# Patient Record
Sex: Male | Born: 1950
Health system: Southern US, Community
[De-identification: ages and names within clinical notes are randomized; demographics above are authoritative.]

## PROBLEM LIST (undated history)

## (undated) DIAGNOSIS — F419 Anxiety disorder, unspecified: Secondary | ICD-10-CM

## (undated) DIAGNOSIS — R011 Cardiac murmur, unspecified: Secondary | ICD-10-CM

## (undated) DIAGNOSIS — I499 Cardiac arrhythmia, unspecified: Secondary | ICD-10-CM

## (undated) DIAGNOSIS — M349 Systemic sclerosis, unspecified: Secondary | ICD-10-CM

## (undated) DIAGNOSIS — F329 Major depressive disorder, single episode, unspecified: Secondary | ICD-10-CM

## (undated) DIAGNOSIS — R0600 Dyspnea, unspecified: Secondary | ICD-10-CM

## (undated) DIAGNOSIS — J439 Emphysema, unspecified: Secondary | ICD-10-CM

## (undated) DIAGNOSIS — H269 Unspecified cataract: Secondary | ICD-10-CM

## (undated) DIAGNOSIS — I7300 Raynaud's syndrome without gangrene: Secondary | ICD-10-CM

## (undated) DIAGNOSIS — J849 Interstitial pulmonary disease, unspecified: Secondary | ICD-10-CM

## (undated) DIAGNOSIS — E785 Hyperlipidemia, unspecified: Secondary | ICD-10-CM

## (undated) DIAGNOSIS — F32A Depression, unspecified: Secondary | ICD-10-CM

## (undated) DIAGNOSIS — I739 Peripheral vascular disease, unspecified: Secondary | ICD-10-CM

## (undated) DIAGNOSIS — I4891 Unspecified atrial fibrillation: Secondary | ICD-10-CM

## (undated) DIAGNOSIS — I10 Essential (primary) hypertension: Secondary | ICD-10-CM

## (undated) DIAGNOSIS — F1910 Other psychoactive substance abuse, uncomplicated: Secondary | ICD-10-CM

## (undated) DIAGNOSIS — Z5189 Encounter for other specified aftercare: Secondary | ICD-10-CM

## (undated) HISTORY — DX: Depression, unspecified: F32.A

## (undated) HISTORY — PX: EYE SURGERY: SHX253

## (undated) HISTORY — DX: Major depressive disorder, single episode, unspecified: F32.9

## (undated) HISTORY — DX: Encounter for other specified aftercare: Z51.89

## (undated) HISTORY — DX: Unspecified cataract: H26.9

## (undated) HISTORY — DX: Anxiety disorder, unspecified: F41.9

## (undated) HISTORY — DX: Emphysema, unspecified: J43.9

## (undated) HISTORY — PX: CATARACT EXTRACTION: SUR2

## (undated) HISTORY — PX: COLONOSCOPY: SHX174

## (undated) HISTORY — DX: Hyperlipidemia, unspecified: E78.5

## (undated) HISTORY — PX: HERNIA REPAIR: SHX51

## (undated) HISTORY — DX: Cardiac murmur, unspecified: R01.1

## (undated) HISTORY — DX: Other psychoactive substance abuse, uncomplicated: F19.10

## (undated) HISTORY — PX: ATRIAL FIBRILLATION ABLATION: SHX5732

---

## 1974-06-30 DIAGNOSIS — Z5189 Encounter for other specified aftercare: Secondary | ICD-10-CM

## 1974-06-30 HISTORY — DX: Encounter for other specified aftercare: Z51.89

## 1974-06-30 HISTORY — PX: FRACTURE SURGERY: SHX138

## 1999-02-07 ENCOUNTER — Ambulatory Visit (HOSPITAL_COMMUNITY): Admission: RE | Admit: 1999-02-07 | Discharge: 1999-02-07 | Payer: Self-pay | Admitting: Internal Medicine

## 1999-02-07 ENCOUNTER — Encounter: Payer: Self-pay | Admitting: Internal Medicine

## 1999-03-07 ENCOUNTER — Ambulatory Visit (HOSPITAL_COMMUNITY): Admission: RE | Admit: 1999-03-07 | Discharge: 1999-03-07 | Payer: Self-pay | Admitting: Cardiology

## 2000-10-09 ENCOUNTER — Encounter: Payer: Self-pay | Admitting: Internal Medicine

## 2000-10-09 ENCOUNTER — Ambulatory Visit (HOSPITAL_COMMUNITY): Admission: RE | Admit: 2000-10-09 | Discharge: 2000-10-09 | Payer: Self-pay | Admitting: Internal Medicine

## 2001-12-02 ENCOUNTER — Encounter: Payer: Self-pay | Admitting: Internal Medicine

## 2001-12-02 ENCOUNTER — Ambulatory Visit (HOSPITAL_COMMUNITY): Admission: RE | Admit: 2001-12-02 | Discharge: 2001-12-02 | Payer: Self-pay | Admitting: Internal Medicine

## 2005-01-17 ENCOUNTER — Ambulatory Visit (HOSPITAL_COMMUNITY): Admission: RE | Admit: 2005-01-17 | Discharge: 2005-01-17 | Payer: Self-pay | Admitting: Internal Medicine

## 2007-04-22 ENCOUNTER — Ambulatory Visit (HOSPITAL_COMMUNITY): Admission: RE | Admit: 2007-04-22 | Discharge: 2007-04-22 | Payer: Self-pay | Admitting: Internal Medicine

## 2009-10-06 ENCOUNTER — Inpatient Hospital Stay (HOSPITAL_COMMUNITY): Admission: EM | Admit: 2009-10-06 | Discharge: 2009-10-10 | Payer: Self-pay | Admitting: Emergency Medicine

## 2009-10-08 ENCOUNTER — Encounter (INDEPENDENT_AMBULATORY_CARE_PROVIDER_SITE_OTHER): Payer: Self-pay | Admitting: Cardiology

## 2009-10-10 ENCOUNTER — Encounter: Payer: Self-pay | Admitting: Cardiology

## 2010-04-28 ENCOUNTER — Ambulatory Visit: Payer: Self-pay | Admitting: Cardiology

## 2010-04-28 ENCOUNTER — Ambulatory Visit: Payer: Self-pay | Admitting: Internal Medicine

## 2010-04-28 ENCOUNTER — Inpatient Hospital Stay (HOSPITAL_COMMUNITY): Admission: EM | Admit: 2010-04-28 | Discharge: 2010-05-06 | Payer: Self-pay | Admitting: Cardiology

## 2010-04-28 ENCOUNTER — Encounter: Payer: Self-pay | Admitting: Emergency Medicine

## 2010-04-28 LAB — CONVERTED CEMR LAB: LDL Cholesterol: 113 mg/dL

## 2010-04-30 ENCOUNTER — Encounter: Payer: Self-pay | Admitting: Cardiology

## 2010-05-01 ENCOUNTER — Ambulatory Visit: Payer: Self-pay | Admitting: Dentistry

## 2010-05-01 LAB — CONVERTED CEMR LAB: Creatinine, Ser: 0.82 mg/dL

## 2010-05-06 LAB — CONVERTED CEMR LAB
HDL: 38 mg/dL
Hemoglobin: 14.1 g/dL
MCHC: 33.6 g/dL
MCV: 92.1 fL
RBC: 4.56 M/uL
Total CHOL/HDL Ratio: 4.4
VLDL: 17 mg/dL
WBC: 9.7 10*3/uL

## 2010-05-09 ENCOUNTER — Ambulatory Visit: Payer: Self-pay | Admitting: Cardiology

## 2010-05-13 ENCOUNTER — Encounter: Admission: RE | Admit: 2010-05-13 | Discharge: 2010-05-13 | Payer: Self-pay | Admitting: Dentistry

## 2010-05-16 ENCOUNTER — Ambulatory Visit: Payer: Self-pay | Admitting: Cardiovascular Disease

## 2010-05-16 LAB — CONVERTED CEMR LAB: POC INR: 3.1

## 2010-05-27 ENCOUNTER — Ambulatory Visit: Payer: Self-pay | Admitting: Cardiology

## 2010-06-06 ENCOUNTER — Encounter: Payer: Self-pay | Admitting: Internal Medicine

## 2010-06-06 ENCOUNTER — Ambulatory Visit: Payer: Self-pay | Admitting: Internal Medicine

## 2010-06-06 DIAGNOSIS — F17200 Nicotine dependence, unspecified, uncomplicated: Secondary | ICD-10-CM

## 2010-06-06 DIAGNOSIS — I83009 Varicose veins of unspecified lower extremity with ulcer of unspecified site: Secondary | ICD-10-CM

## 2010-06-06 DIAGNOSIS — I7300 Raynaud's syndrome without gangrene: Secondary | ICD-10-CM

## 2010-06-06 DIAGNOSIS — F341 Dysthymic disorder: Secondary | ICD-10-CM | POA: Insufficient documentation

## 2010-06-06 DIAGNOSIS — L97909 Non-pressure chronic ulcer of unspecified part of unspecified lower leg with unspecified severity: Secondary | ICD-10-CM

## 2010-06-06 LAB — CONVERTED CEMR LAB: POC INR: 3.2

## 2010-07-30 NOTE — Medication Information (Signed)
Summary: rov/eac  Anticoagulant Therapy  Managed by: Weston Brass, PharmD Referring MD: Ladona Ridgel MD, Sharlot Gowda PCP: Dala Dock Supervising MD: Riley Kill MD, Maisie Fus Indication 1: Atrial Flutter Lab Used: [facility] [place] Site: [address] INR POC 2.8 INR RANGE 2.0-3.0  Dietary changes: yes       Details: Had some greens, cranberries, wine for Thanksgiving  Health status changes: no    Bleeding/hemorrhagic complications: no    Recent/future hospitalizations: no    Any changes in medication regimen? no    Recent/future dental: no  Any missed doses?: no       Is patient compliant with meds? yes       Anticoagulation Management History:      The patient is taking warfarin and comes in today for a routine follow up visit.  Negative risk factors for bleeding include an age less than 64 years old.  The bleeding index is 'low risk'.  Negative CHADS2 values include Age > 25 years old.  Anticoagulation responsible provider: Riley Kill MD, Maisie Fus.  INR POC: 2.8.  Cuvette Lot#: [id number].  Exp: 05/2011.    Anticoagulation Management Assessment/Plan:      The patient's current anticoagulation dose is Coumadin 5 mg tabs: Use as directed by anticoagulation clinic..  The next INR is due 06/06/2010.  Results were reviewed/authorized by Weston Brass, PharmD.  He was notified by Hoy Register, PharmD Candidate.         Prior Anticoagulation Instructions: INR 3.1  Start NEW dosing schedule of 2.5 mg (1/2  tablet) on Tuesday, Thursday, and Saturday, and 5 mg (1 tablet) all other days.  Return to clinic in 1 week.   Current Anticoagulation Instructions: INR 2.8 Continue previous dose of 1 tablet everyday except 0.5 tablet on Tuesday, Thursday, and Saturday Recheck INR in 2 weeks

## 2010-07-30 NOTE — Medication Information (Signed)
Summary: new to coumadin  Anticoagulant Therapy  Managed by: Reina Fuse, PharmD Referring MD: Ladona Ridgel MD, Sharlot Gowda PCP: Adonis Housekeeper MD: Antoine Poche MD, Fayrene Fearing Indication 1: Atrial Flutter Lab Used: [facility] [place] Site: [address] INR POC 3.0 INR RANGE 2.0-3.0  Dietary changes: no    Health status changes: no    Bleeding/hemorrhagic complications: no    Recent/future hospitalizations: no    Any changes in medication regimen? yes       Details: Has been on amoxcillin. Has about 5 days left.   Recent/future dental: no  Any missed doses?: no       Is patient compliant with meds? yes      Comments: Dr. Vaughan Basta previous PCP. Pt scheduled for appt at [facility] and with Dr. Ladona Ridgel on 12/8. Previously on Coumadin x 4 wks in January followed at [place].  New pt education completed. Pt expressed understanding about risks and s/sx of bleeding, consistency in dietary vitamin K intake, and calling the clinic with any new medications.   Anticoagulation Management History:      The patient comes in today for his initial visit for anticoagulation therapy.  Negative risk factors for bleeding include an age less than 90 years old.  The bleeding index is 'low risk'.  Negative CHADS2 values include Age > 16 years old.  Anticoagulation responsible Petina [place]: Antoine Poche MD, Fayrene Fearing.  INR POC: 3.0.  Cuvette Lot#: [id number].    Anticoagulation Management Assessment/Plan:      The patient's current anticoagulation dose is Coumadin 5 mg tabs: Use as directed by anticoagulation clinic..  The next INR is due 05/16/2010.  Results were reviewed/authorized by Reina Fuse, PharmD.  He was notified by Reina Fuse PharmD.         Current Anticoagulation Instructions: INR 3.0  Stop taking Lovenox injections. Use Coumadin 2.5 mg tablets. Take Coumadin 2 tabs (5 mg) on Sun, Tues, Wed, Fri, Sat and Coumadin 1 tab (2.5 mg) on Mon, Thur. Return to clinic in 1 week.

## 2010-07-30 NOTE — Medication Information (Signed)
Summary: rov/nb  Anticoagulant Therapy  Managed by: Bethena Midget, RN, BSN Referring MD: Ladona Ridgel MD, Sharlot Gowda PCP: Adonis Housekeeper MD: Graciela Husbands MD, Viviann Spare Indication 1: Atrial Flutter Lab Used: [facility] [place] Site: [address] INR POC 3.2 INR RANGE 2.0-3.0  Dietary changes: no    Health status changes: no    Bleeding/hemorrhagic complications: no    Recent/future hospitalizations: no    Any changes in medication regimen? no    Recent/future dental: no  Any missed doses?: no       Is patient compliant with meds? yes      Comments: Seeing Dr Ladona Ridgel today   Anticoagulation Management History:      The patient is taking warfarin and comes in today for a routine follow up visit.  Negative risk factors for bleeding include an age less than 81 years old.  The bleeding index is 'low risk'.  Negative CHADS2 values include Age > 78 years old.  Anticoagulation responsible provider: Graciela Husbands MD, Viviann Spare.  INR POC: 3.2.  Cuvette Lot#: [id number].  Exp: 08/2011.    Anticoagulation Management Assessment/Plan:      The patient's current anticoagulation dose is Coumadin 5 mg tabs: Use as directed by anticoagulation clinic..  The next INR is due 06/20/2010.  Anticoagulation instructions were given to patient.  Results were reviewed/authorized by Bethena Midget, RN, BSN.  He was notified by Bethena Midget, RN, BSN.         Prior Anticoagulation Instructions: INR 2.8 Continue previous dose of 1 tablet everyday except 0.5 tablet on Tuesday, Thursday, and Saturday Recheck INR in 2 weeks  Current Anticoagulation Instructions: INR 3.2 Skip today's dose then change to 1/2 pill everyday except 1 pill on Mondays, Wednesdays and Fridays. Recheck in 2 weeks.

## 2010-07-30 NOTE — Medication Information (Signed)
Summary: Coumadin Clinic  Anticoagulant Therapy  Managed by: Inactive Referring MD: Ladona Ridgel MD, Sharlot Gowda PCP: Adonis Housekeeper MD: Ladona Ridgel MD, Sharlot Gowda Indication 1: Atrial Flutter Lab Used: [facility] [place] Site: [address] INR RANGE 2.0-3.0          Comments: Pt saw Dr Ladona Ridgel today, off coumadin now on ASA  Anticoagulation Management History:      Negative risk factors for bleeding include an age less than 1 years old.  The bleeding index is 'low risk'.  Negative CHADS2 values include Age > 67 years old.  Anticoagulation responsible provider: Ladona Ridgel MD, Sharlot Gowda.  Exp: 08/2011.    Anticoagulation Management Assessment/Plan:      The patient's current anticoagulation dose is Coumadin 5 mg tabs: Use as directed by anticoagulation clinic..  The next INR is due 06/20/2010.  Anticoagulation instructions were given to patient.  Results were reviewed/authorized by Inactive.         Prior Anticoagulation Instructions: INR 3.2 Skip today's dose then change to 1/2 pill everyday except 1 pill on Mondays, Wednesdays and Fridays. Recheck in 2 weeks.

## 2010-07-30 NOTE — Medication Information (Signed)
Summary: rov/sl  Anticoagulant Therapy  Managed by: Eda Keys, PharmD Referring MD: Ladona Ridgel MD, Sharlot Gowda PCP: Adonis Housekeeper MD: Clifton James MD, Cristal Deer Indication 1: Atrial Flutter Lab Used: [facility] [place] Site: [address] INR POC 3.1 INR RANGE 2.0-3.0  Dietary changes: no    Health status changes: no    Bleeding/hemorrhagic complications: no    Recent/future hospitalizations: no    Any changes in medication regimen? no    Recent/future dental: no  Any missed doses?: no       Is patient compliant with meds? yes       Anticoagulation Management History:      The patient is taking warfarin and comes in today for a routine follow up visit.  Negative risk factors for bleeding include an age less than 44 years old.  The bleeding index is 'low risk'.  Negative CHADS2 values include Age > 40 years old.  Anticoagulation responsible provider: Clifton James MD, Cristal Deer.  INR POC: 3.1.  Cuvette Lot#: [id number].  Exp: 05/2011.    Anticoagulation Management Assessment/Plan:      The patient's current anticoagulation dose is Coumadin 5 mg tabs: Use as directed by anticoagulation clinic..  The next INR is due 05/24/2010.  Results were reviewed/authorized by Eda Keys, PharmD.  He was notified by Eda Keys.         Prior Anticoagulation Instructions: INR 3.0  Stop taking Lovenox injections. Use Coumadin 2.5 mg tablets. Take Coumadin 2 tabs (5 mg) on Sun, Tues, Wed, Fri, Sat and Coumadin 1 tab (2.5 mg) on Mon, Thur. Return to clinic in 1 week.  Current Anticoagulation Instructions: INR 3.1  Start NEW dosing schedule of 2.5 mg (1/2  tablet) on Tuesday, Thursday, and Saturday, and 5 mg (1 tablet) all other days.  Return to clinic in 1 week.
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
## 2010-07-30 NOTE — Assessment & Plan Note (Signed)
Summary: eph/jml   Visit Type:  Follow-up Primary Provider:  [facility]   History of Present Illness: Bradley Ferguson returns today for followup.  He is a pleasant 60 yo man with atrial fluttter who underwent EPS/RFA several weeks ago.  He had done well from an arrhythmia perspective.  He denies c/p, sob, or peripheral edema.  No palpitations.  Current Medications (verified): 1)  Coumadin 5 Mg Tabs (Warfarin Sodium) .... Use As Directed By Anticoagulation Clinic. 2)  Alprazolam 0.5 Mg Tabs (Alprazolam) .... Take One Tablet At Bedtime As Needed.  Allergies (verified): No Known Drug Allergies  Past History:  Past Medical History: Last updated: 06/06/2010 Current Problems:  ATRIAL FLUTTER (ICD-427.32)    Review of Systems  The patient denies chest pain, syncope, dyspnea on exertion, and peripheral edema.    Vital Signs:  Patient profile:   60 year old male Height:      73 inches Weight:      172 pounds BMI:     22.77 Pulse rate:   80 / minute BP sitting:   120 / 78  (left arm)  Vitals Entered By: Laurance Flatten CMA (June 06, 2010 10:52 AM)  Physical Exam  General:  Well developed, well nourished, in no acute distress.  HEENT: normal Neck: supple. No JVD. Carotids 2+ bilaterally no bruits Cor: RRR no rubs, gallops or murmur Lungs: CTA Ab: soft, nontender. nondistended. No HSM. Good bowel sounds Ext: warm. no cyanosis, clubbing or edema Neuro: alert and oriented. Grossly nonfocal. affect pleasant    EKG  Procedure date:  06/06/2010  Findings:      Normal sinus rhythm with rate of:  80.  Impression & Recommendations:  Problem # 1:  ATRIAL FLUTTER (ICD-427.32) He has done well since his ablation.  I have recommended he stop coumadin and start ASA. His updated medication list for this problem includes:    Aspirin 325 Mg Tabs (Aspirin) ..... One by mouth daily  Patient Instructions: 1)  Your physician recommends that you schedule a follow-up appointment as  needed 2)  Your physician has recommended you make the following change in your medication: stop Coumadin start Aspirin 325mg 

## 2010-08-08 ENCOUNTER — Encounter: Payer: Self-pay | Admitting: Nurse Practitioner

## 2010-08-08 ENCOUNTER — Encounter (INDEPENDENT_AMBULATORY_CARE_PROVIDER_SITE_OTHER): Payer: Self-pay | Admitting: Nurse Practitioner

## 2010-08-08 DIAGNOSIS — F988 Other specified behavioral and emotional disorders with onset usually occurring in childhood and adolescence: Secondary | ICD-10-CM | POA: Insufficient documentation

## 2010-08-08 DIAGNOSIS — K029 Dental caries, unspecified: Secondary | ICD-10-CM | POA: Insufficient documentation

## 2010-08-15 NOTE — Assessment & Plan Note (Signed)
Summary: Dental/Optho referral   Vital Signs:  Patient profile:   60 year old male Weight:      173.6 pounds Temp:     97.5 degrees F oral Pulse rate:   72 / minute Pulse rhythm:   regular Resp:     16 per minute BP sitting:   124 / 76  (left arm) Cuff size:   regular  Vitals Entered By: Levon Hedger (August 08, 2010 10:21 AM) CC: dental referral for broken teeth...opthamology referral, Depression Is Patient Diabetic? No Pain Assessment Patient in pain? no       Does patient need assistance? Functional Status Self care Ambulation Normal   Primary Care Provider:  [facility]  CC:  dental referral for broken teeth...opthamology referral and Depression.  History of Present Illness:  Pt into the office for f/u on dental referral. he did have some recent extractions during a hospitalization as he has infection. He does have some other teeth that need removal. He has some other teeth in which the filling as spontaneously fallen out  Right eye - Known history of cataracts in his right eye. Never had official Dx but was seen by a friend as a compassionate visit but not treatment indicated. [facility] and he was also instructed that he needed referral to an opthomologist.  Psych- pt was tried on Ritalin and Adderral due to psych history.  Pt had to stop taking due to cardiac history.  He has taken alprazolam as needed for years at night for rest however he has recently tried to take during the day (small amounts - 1/2 of the recommended dose)  Pt reports that when he is active such as at work and if he takes small amounts of the pill then it actually increases his productivity.  He has also taken multiple depression meds over the years without resolution of the symptoms.  Depression History:      The patient denies a depressed mood most of the day and a diminished interest in his usual daily activities.  Positive alarm features for  depression include fatigue (loss of energy) and impaired concentration (indecisiveness).  However, he denies recurrent thoughts of death or suicide.        Psychosocial stress factors include major life changes.  The patient denies that he feels like life is not worth living, denies that he wishes that he were dead, and denies that he has thought about ending his life.         Habits & Providers  Alcohol-Tobacco-Diet     Alcohol drinks/day: <1     Alcohol Counseling: not indicated; use of alcohol is not excessive or problematic     Tobacco Status: current     Tobacco Counseling: to quit use of tobacco products     Cigarette Packs/Day: 1/2 pack  Exercise-Depression-Behavior     Depression Counseling: not indicated; screening negative for depression     Drug Use: never  Allergies (verified): No Known Drug Allergies  Review of Systems General:  Denies fever. Eyes:  Complains of blurring. ENT:  Complains of ringing in ears. CV:  Denies chest pain or discomfort. Resp:  Denies cough. GI:  Denies abdominal pain, nausea, and vomiting. Psych:  Complains of anxiety and depression.  Physical Exam  General:  alert.   Head:  glasses Mouth:  poor dentition.   Lungs:  normal breath sounds.   Heart:  normal rate and regular rhythm.  Neurologic:  alert & oriented X3.   Psych:  Oriented X3.  talkative   Impression & Recommendations:  Problem # 1:  DENTAL CARIES (ICD-521.00) advised pt that he will be referred to the dental clinic and he will be contacted with the time/date of the appt Orders: Dental Referral (Dentist)  Problem # 2:  VISUAL ACUITY, DECREASED (ICD-369.9) pt has what he thinks to be a known cataract vision problems at this time - right eye needs referral  Orders: Ophthalmology Referral (Ophthalmology)  Problem # 3:  TOBACCO ABUSE (ICD-305.1) ongoing  advise pt to quit   Problem # 4:  ANXIETY DEPRESSION (ICD-300.4) will oblige pt's request to refill alprazolam -  advise pt to take as ordered also spoke with pt that this med may cause sedation - would advise against taking during the day but if he must do so, take cautiiously  Complete Medication List: 1)  Aspirin 325 Mg Tabs (Aspirin) .... One by mouth daily 2)  Alprazolam 0.5 Mg Tabs (Alprazolam) .... One tablet by mouth two times a day as needed for mood/anxiety  Patient Instructions: 1)  Dentist - You will be referred to the dental clinic. You will be notified of the time/date of your appointment 2)  Eye - You will be referrred to the eye doctor.  You will be notified o the time/date of the appointment. 3)  You can these two numbers on your own to see if they can offer you any assistance 4)  [facility] [phone] 5)  or [facility] [phone] 6)  Alprazolam - take as needed. Be advised that this medication may make you sleepy so take with caution during the day 7)  Follow up as needed Prescriptions: ALPRAZOLAM 0.5 MG TABS (ALPRAZOLAM) One tablet by mouth two times a day as needed for mood/anxiety  #60 x 0   Entered and Authorized by:   Lehman Prom FNP   Signed by:   Lehman Prom FNP on 08/08/2010   Method used:   Print then Give to Patient   RxID:   [id number]    Orders Added: 1)  Est. Patient Level III [95284] 2)  Dental Referral [Dentist] 3)  Ophthalmology Referral [Ophthalmology]

## 2010-08-15 NOTE — Letter (Signed)
Summary: DENTAL REFERRAL   DENTAL REFERRAL   Imported By: Arta Bruce 08/08/2010 12:04:07  _____________________________________________________________________  External Attachment:    Type:   Image     Comment:   External Document

## 2010-08-15 NOTE — Letter (Signed)
Summary: AGREEMENT FOR CONTROLLED SUBSTANCE  AGREEMENT FOR CONTROLLED SUBSTANCE   Imported By: Arta Bruce 08/08/2010 11:44:46  _____________________________________________________________________  External Attachment:    Type:   Image     Comment:   External Document

## 2010-08-15 NOTE — Letter (Signed)
Summary: Handout Printed  Printed Handout:  - Cataract

## 2010-09-10 LAB — CBC
HCT: 37.6 % — ABNORMAL LOW (ref 39.0–52.0)
HCT: 38.7 % — ABNORMAL LOW (ref 39.0–52.0)
HCT: 40.9 % (ref 39.0–52.0)
Hemoglobin: 12.2 g/dL — ABNORMAL LOW (ref 13.0–17.0)
Hemoglobin: 12.4 g/dL — ABNORMAL LOW (ref 13.0–17.0)
Hemoglobin: 12.8 g/dL — ABNORMAL LOW (ref 13.0–17.0)
Hemoglobin: 13 g/dL (ref 13.0–17.0)
Hemoglobin: 13.6 g/dL (ref 13.0–17.0)
MCH: 29.9 pg (ref 26.0–34.0)
MCH: 30 pg (ref 26.0–34.0)
MCH: 30.6 pg (ref 26.0–34.0)
MCH: 31.2 pg (ref 26.0–34.0)
MCHC: 33.1 g/dL (ref 30.0–36.0)
MCHC: 34 g/dL (ref 30.0–36.0)
MCV: 91.5 fL (ref 78.0–100.0)
MCV: 91.9 fL (ref 78.0–100.0)
MCV: 92.1 fL (ref 78.0–100.0)
MCV: 92.1 fL (ref 78.0–100.0)
Platelets: 138 10*3/uL — ABNORMAL LOW (ref 150–400)
Platelets: 144 10*3/uL — ABNORMAL LOW (ref 150–400)
Platelets: 145 10*3/uL — ABNORMAL LOW (ref 150–400)
RBC: 4.05 MIL/uL — ABNORMAL LOW (ref 4.22–5.81)
RBC: 4.08 MIL/uL — ABNORMAL LOW (ref 4.22–5.81)
RBC: 4.1 MIL/uL — ABNORMAL LOW (ref 4.22–5.81)
RBC: 4.23 MIL/uL (ref 4.22–5.81)
RBC: 4.33 MIL/uL (ref 4.22–5.81)
RBC: 4.44 MIL/uL (ref 4.22–5.81)
RBC: 4.56 MIL/uL (ref 4.22–5.81)
RDW: 14.1 % (ref 11.5–15.5)
RDW: 14.2 % (ref 11.5–15.5)
WBC: 6.7 10*3/uL (ref 4.0–10.5)
WBC: 9.1 10*3/uL (ref 4.0–10.5)
WBC: 9.2 10*3/uL (ref 4.0–10.5)
WBC: 9.7 10*3/uL (ref 4.0–10.5)

## 2010-09-10 LAB — HEPARIN LEVEL (UNFRACTIONATED)
Heparin Unfractionated: 0.14 IU/mL — ABNORMAL LOW (ref 0.30–0.70)
Heparin Unfractionated: 0.14 IU/mL — ABNORMAL LOW (ref 0.30–0.70)
Heparin Unfractionated: 0.18 IU/mL — ABNORMAL LOW (ref 0.30–0.70)
Heparin Unfractionated: 0.22 IU/mL — ABNORMAL LOW (ref 0.30–0.70)
Heparin Unfractionated: 0.32 IU/mL (ref 0.30–0.70)
Heparin Unfractionated: 0.37 IU/mL (ref 0.30–0.70)
Heparin Unfractionated: <0.1 IU/mL — ABNORMAL LOW (ref 0.30–0.70)

## 2010-09-10 LAB — PROTIME-INR
INR: 1.01 (ref 0.00–1.49)
INR: 1.53 — ABNORMAL HIGH (ref 0.00–1.49)
INR: 1.66 — ABNORMAL HIGH (ref 0.00–1.49)
Prothrombin Time: 19.8 seconds — ABNORMAL HIGH (ref 11.6–15.2)

## 2010-09-10 LAB — BASIC METABOLIC PANEL
BUN: 15 mg/dL (ref 6–23)
CO2: 27 mEq/L (ref 19–32)
Chloride: 105 mEq/L (ref 96–112)
Chloride: 108 mEq/L (ref 96–112)
Creatinine, Ser: 0.91 mg/dL (ref 0.4–1.5)
GFR calc Af Amer: >60 mL/min (ref >60)
GFR calc non Af Amer: >60 mL/min (ref >60)

## 2010-09-10 LAB — HEPARIN INDUCED THROMBOCYTOPENIA PNL
UFH High Dose UFH H: 0 % Release
UFH SRA Result: NEGATIVE

## 2010-09-11 LAB — URINALYSIS, ROUTINE W REFLEX MICROSCOPIC
Glucose, UA: NEGATIVE mg/dL
Ketones, ur: NEGATIVE mg/dL
Nitrite: NEGATIVE
Protein, ur: NEGATIVE mg/dL
Urobilinogen, UA: 0.2 mg/dL (ref 0.0–1.0)

## 2010-09-11 LAB — CBC
HCT: 39.8 % (ref 39.0–52.0)
HCT: 43.5 % (ref 39.0–52.0)
HCT: 44.2 % (ref 39.0–52.0)
Hemoglobin: 13.4 g/dL (ref 13.0–17.0)
Hemoglobin: 14.9 g/dL (ref 13.0–17.0)
Hemoglobin: 15.2 g/dL (ref 13.0–17.0)
MCHC: 34.3 g/dL (ref 30.0–36.0)
MCHC: 34.3 g/dL (ref 30.0–36.0)
MCV: 92.2 fL (ref 78.0–100.0)
MCV: 92.3 fL (ref 78.0–100.0)
Platelets: 147 10*3/uL — ABNORMAL LOW (ref 150–400)
RBC: 4.31 MIL/uL (ref 4.22–5.81)
RBC: 4.75 MIL/uL (ref 4.22–5.81)
WBC: 10 10*3/uL (ref 4.0–10.5)
WBC: 10 10*3/uL (ref 4.0–10.5)
WBC: 9.2 10*3/uL (ref 4.0–10.5)

## 2010-09-11 LAB — POCT I-STAT, CHEM 8
BUN: 16 mg/dL (ref 6–23)
Chloride: 105 mEq/L (ref 96–112)
Glucose, Bld: 96 mg/dL (ref 70–99)
HCT: 47 % (ref 39.0–52.0)
Potassium: 4.4 mEq/L (ref 3.5–5.1)

## 2010-09-11 LAB — POCT CARDIAC MARKERS
CKMB, poc: <1 ng/mL — ABNORMAL LOW (ref 1.0–8.0)
Myoglobin, poc: 54.1 ng/mL (ref 12–200)

## 2010-09-11 LAB — DIFFERENTIAL
Basophils Relative: 1 % (ref 0–1)
Eosinophils Relative: 5 % (ref 0–5)
Lymphocytes Relative: 29 % (ref 12–46)
Lymphocytes Relative: 30 % (ref 12–46)
Monocytes Absolute: 0.7 10*3/uL (ref 0.1–1.0)
Monocytes Absolute: 0.7 10*3/uL (ref 0.1–1.0)
Monocytes Relative: 7 % (ref 3–12)
Monocytes Relative: 7 % (ref 3–12)
Neutro Abs: 5.8 10*3/uL (ref 1.7–7.7)
Neutro Abs: 5.8 10*3/uL (ref 1.7–7.7)
Neutrophils Relative %: 58 % (ref 43–77)

## 2010-09-11 LAB — MRSA PCR SCREENING: MRSA by PCR: NEGATIVE

## 2010-09-11 LAB — PROTIME-INR
INR: 1.01 (ref 0.00–1.49)
Prothrombin Time: 13.5 seconds (ref 11.6–15.2)

## 2010-09-11 LAB — TSH: TSH: 1.682 u[IU]/mL (ref 0.350–4.500)

## 2010-09-11 LAB — RAPID URINE DRUG SCREEN, HOSP PERFORMED
Barbiturates: NOT DETECTED
Benzodiazepines: POSITIVE — AB

## 2010-09-18 LAB — T4: T4, Total: 9 ug/dL (ref 5.0–12.5)

## 2010-09-18 LAB — COMPREHENSIVE METABOLIC PANEL
ALT: 16 U/L (ref 0–53)
CO2: 29 mEq/L (ref 19–32)
Calcium: 9.1 mg/dL (ref 8.4–10.5)
GFR calc non Af Amer: >60 mL/min (ref >60)
Glucose, Bld: 92 mg/dL (ref 70–99)
Sodium: 138 mEq/L (ref 135–145)

## 2010-09-18 LAB — CBC
HCT: 42.4 % (ref 39.0–52.0)
Hemoglobin: 14.4 g/dL (ref 13.0–17.0)
MCHC: 33.5 g/dL (ref 30.0–36.0)
MCHC: 33.8 g/dL (ref 30.0–36.0)
MCHC: 33.8 g/dL (ref 30.0–36.0)
MCV: 94.1 fL (ref 78.0–100.0)
MCV: 94.2 fL (ref 78.0–100.0)
MCV: 94.3 fL (ref 78.0–100.0)
RBC: 4.07 MIL/uL — ABNORMAL LOW (ref 4.22–5.81)
RBC: 4.27 MIL/uL (ref 4.22–5.81)
RBC: 4.5 MIL/uL (ref 4.22–5.81)
RDW: 13.9 % (ref 11.5–15.5)
RDW: 14.2 % (ref 11.5–15.5)

## 2010-09-18 LAB — HEPARIN LEVEL (UNFRACTIONATED)
Heparin Unfractionated: 0.46 IU/mL (ref 0.30–0.70)
Heparin Unfractionated: 0.47 IU/mL (ref 0.30–0.70)
Heparin Unfractionated: 0.52 IU/mL (ref 0.30–0.70)
Heparin Unfractionated: 0.7 IU/mL (ref 0.30–0.70)

## 2010-09-18 LAB — PROTIME-INR
INR: 2.42 — ABNORMAL HIGH (ref 0.00–1.49)
Prothrombin Time: 13.5 seconds (ref 11.6–15.2)
Prothrombin Time: 14.2 seconds (ref 11.6–15.2)
Prothrombin Time: 26.1 seconds — ABNORMAL HIGH (ref 11.6–15.2)

## 2010-09-18 LAB — TSH: TSH: 0.849 u[IU]/mL (ref 0.350–4.500)

## 2010-09-18 LAB — MRSA PCR SCREENING: MRSA by PCR: NEGATIVE

## 2010-09-18 LAB — DIFFERENTIAL
Basophils Relative: 0 % (ref 0–1)
Eosinophils Absolute: 0.6 10*3/uL (ref 0.0–0.7)
Lymphs Abs: 2.6 10*3/uL (ref 0.7–4.0)
Neutrophils Relative %: 58 % (ref 43–77)

## 2010-09-18 LAB — POCT CARDIAC MARKERS: Myoglobin, poc: 79.2 ng/mL (ref 12–200)

## 2010-09-18 LAB — CARDIAC PANEL(CRET KIN+CKTOT+MB+TROPI)
CK, MB: 1.6 ng/mL (ref 0.3–4.0)
Total CK: 97 U/L (ref 7–232)

## 2011-02-18 ENCOUNTER — Emergency Department (HOSPITAL_COMMUNITY)
Admission: EM | Admit: 2011-02-18 | Discharge: 2011-02-18 | Disposition: A | Discharge status: Home / Self Care | Payer: Self-pay | Attending: Emergency Medicine | Admitting: Emergency Medicine

## 2011-02-18 DIAGNOSIS — Y92009 Unspecified place in unspecified non-institutional (private) residence as the place of occurrence of the external cause: Secondary | ICD-10-CM | POA: Insufficient documentation

## 2011-02-18 DIAGNOSIS — R000 Tachycardia, unspecified: Secondary | ICD-10-CM | POA: Insufficient documentation

## 2011-02-18 DIAGNOSIS — W2209XA Striking against other stationary object, initial encounter: Secondary | ICD-10-CM | POA: Insufficient documentation

## 2011-02-18 DIAGNOSIS — S0100XA Unspecified open wound of scalp, initial encounter: Secondary | ICD-10-CM | POA: Insufficient documentation

## 2011-02-28 ENCOUNTER — Inpatient Hospital Stay (INDEPENDENT_AMBULATORY_CARE_PROVIDER_SITE_OTHER)
Admission: RE | Admit: 2011-02-28 | Discharge: 2011-02-28 | Disposition: A | Discharge status: Home / Self Care | Payer: Self-pay | Source: Ambulatory Visit | Attending: Emergency Medicine | Admitting: Emergency Medicine

## 2011-02-28 DIAGNOSIS — S0180XA Unspecified open wound of other part of head, initial encounter: Secondary | ICD-10-CM

## 2014-12-06 ENCOUNTER — Ambulatory Visit (INDEPENDENT_AMBULATORY_CARE_PROVIDER_SITE_OTHER): Payer: Worker's Compensation | Admitting: Physician Assistant

## 2014-12-06 VITALS — BP 124/64 | HR 74 | Temp 98.6°F | Resp 14 | Ht 73.25 in | Wt 178.4 lb

## 2014-12-06 DIAGNOSIS — W57XXXA Bitten or stung by nonvenomous insect and other nonvenomous arthropods, initial encounter: Secondary | ICD-10-CM | POA: Diagnosis not present

## 2014-12-06 DIAGNOSIS — T148 Other injury of unspecified body region: Secondary | ICD-10-CM

## 2014-12-06 NOTE — Progress Notes (Signed)
[facility] [address], [place] [address] [phone]  Date:  12/06/2014   Name:  Bradley Ferguson   DOB:  10/25/1950   MRN:  [id number]  PCP:  No primary care provider on file.    Chief Complaint: Insect Bite   History of Present Illness:  This is a 64 y.o. male  who is presenting with 2 tick bites that occurred at work. He suspects tick bites occurred 1 week ago. 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 He thinks the ticks were attached for 48 hours. He noticed some erythema and induration around the bites. This is been slowly improving over the past 4 days. He no longer has any induration but continues to have erythema. He denies fever, chills, headache, rash, new arthralgias, N/V.   Review of Systems:  Review of Systems See HPI  Prior to Admission medications   Not on File    No Known Allergies  Past Surgical History  Procedure Laterality Date  . Eye surgery    . Fracture surgery    . Atrial fibrillation ablation      History  Substance Use Topics  . Smoking status: Current Every Day Smoker  . Smokeless tobacco: Not on file  . Alcohol Use: Not on file    Family History  Problem Relation Age of Onset  . Cancer Mother   . Cancer Father     Medication list has been reviewed and updated.  Physical Examination:  Physical Exam  Constitutional: He is oriented to person, place, and time. He appears well-developed and well-nourished. No distress.  HENT:  Head: Normocephalic and atraumatic.  Right Ear: Hearing normal.  Left Ear: Hearing normal.  Nose: Nose normal.  Eyes: Conjunctivae and lids are normal. Right eye exhibits no discharge. Left eye exhibits no discharge. No scleral icterus.  Cardiovascular: Normal rate, regular rhythm, normal heart sounds and normal pulses.   No murmur heard. Pulmonary/Chest: Effort normal and breath sounds normal. No respiratory distress. He has no wheezes. He has no rhonchi. He has no  rales.  Musculoskeletal: Normal range of motion.  Lymphadenopathy:       Head (right side): No submental, no submandibular and no tonsillar adenopathy present.       Head (left side): No submental, no submandibular and no tonsillar adenopathy present.    He has no cervical adenopathy.  Neurological: He is alert and oriented to person, place, and time.  Skin: Skin is warm, dry and intact. Lesion noted. No rash noted.  0.5 cm black scab with surrounding 4 cm erythema on right side of abdomen. No induration or tenderness.  0.5 cm black scab with surrounding 2 cm erythema on right thigh. No induration or tenderness.  Psychiatric: He has a normal mood and affect. His speech is normal and behavior is normal. Thought content normal.   BP 124/64 mmHg  Pulse 74  Temp(Src) 98.6 F (37 C) (Oral)  Resp 14  Ht 6' 1.25" (1.861 m)  Wt 178 lb 6.4 oz (80.922 kg)  BMI 23.37 kg/m2  SpO2 99%  Assessment and Plan:  1. Tick bite Tick bite lesions with local reaction. Lesions do not appear infected. Very unlikely that patient has Lyme disease or [facility] spotted fever but will draw titers for patient reassurance. No treatment in the meantime. - [place] spotted fvr ab, IgM-blood - B. burgdorfi antibodies   Benjaman Pott. Drenda Freeze, MHS [facility]  12/06/2014

## 2014-12-06 NOTE — Patient Instructions (Signed)
I will call you with lab results. In the meantime if you develop fever, chills, headache, new joint pain, nausea, vomiting-return to clinic. No treatment needed at this time.

## 2014-12-07 LAB — ROCKY MTN SPOTTED FVR AB, IGM-BLOOD: ROCKY MTN SPOTTED FEVER, IGM: 0.26 IV

## 2014-12-07 LAB — B. BURGDORFI ANTIBODIES: B BURGDORFERI AB IGG+ IGM: 0.28 {ISR}

## 2015-08-13 ENCOUNTER — Ambulatory Visit: Payer: Self-pay | Admitting: Internal Medicine

## 2015-08-13 ENCOUNTER — Encounter: Payer: Self-pay | Admitting: Internal Medicine

## 2015-08-13 ENCOUNTER — Ambulatory Visit (INDEPENDENT_AMBULATORY_CARE_PROVIDER_SITE_OTHER): Payer: Self-pay | Admitting: Internal Medicine

## 2015-08-13 VITALS — BP 122/80 | HR 76 | Temp 97.0°F | Resp 16 | Ht 74.5 in | Wt 195.4 lb

## 2015-08-13 DIAGNOSIS — F3162 Bipolar disorder, current episode mixed, moderate: Secondary | ICD-10-CM

## 2015-08-13 DIAGNOSIS — B070 Plantar wart: Secondary | ICD-10-CM

## 2015-08-13 DIAGNOSIS — I4891 Unspecified atrial fibrillation: Secondary | ICD-10-CM | POA: Insufficient documentation

## 2015-08-13 DIAGNOSIS — F319 Bipolar disorder, unspecified: Secondary | ICD-10-CM | POA: Insufficient documentation

## 2015-08-13 MED ORDER — CITALOPRAM HYDROBROMIDE 40 MG PO TABS: ORAL_TABLET | ORAL | Status: DC

## 2015-08-13 MED ORDER — ALPRAZOLAM 1 MG PO TABS: ORAL_TABLET | ORAL | Status: DC

## 2015-08-13 MED ORDER — HYDROCODONE-ACETAMINOPHEN 5-325 MG PO TABS: ORAL_TABLET | ORAL | Status: DC

## 2015-08-13 NOTE — Progress Notes (Signed)
  Subjective:    Patient ID: Bradley Ferguson, male    DOB: 05-05-1951, 65 y.o.   MRN: [id number]  HPI  Patient is a very sad chronically ill [name] with multiple Medical co-morbidities  With long hx/o labile HT who has been lost to f/u for 5-6+ years who returns with hx/o pAfib/Flutter undergoing CV in Mar 2011 and then in Nov 2011 had RFA for AFlutter. He has hx/o chronic venous insufficiency and Bipolar - mixed and requests meds for anxiety and depression. His primary c/o is of a painful plantar wart of his R foot.   Currently he's on no meds & denies drug allergies .   Past Medical History  Diagnosis Date  . Anxiety   . Blood transfusion without reported diagnosis   . Cataract   . Depression   . Emphysema of lung ([place])   . Heart murmur    Past Surgical History  Procedure Laterality Date  . Eye surgery    . Fracture surgery    . Atrial fibrillation ablation     Review of Systems as above    Objective:   Physical Exam  BP 122/80 mmHg  Pulse 76  Temp(Src) 97 F (36.1 C)  Resp 16  Ht 6' 2.5" (1.892 m)  Wt 195 lb 6.4 oz (88.633 kg)  BMI 24.76 kg/m2  Patient appears chronically ill - much older than stated age, in no acute distress, with pressured speech, ? Sl flight of ideas and as the epitome of human despair.   Focused exam finds a tender plantar wart t the R plantar 5th MT area . After informed consent and aseptic prep with alcohol and then local anesthesia with Marcaine .05% with Epi,  the area was sharply excised to the deep sub-cut with an # 10 scalpel and then the wound base was deeply hyfrecated. Sterile Abx Ung was applied & patient was instructed in wound care.      Assessment & Plan:   1. Plantar wart, right foot  - HYDROcodone-acetaminophen (NORCO) 5-325 MG tablet; Take 1/2 to 1 tab every 4 hr if needed for pain  Dispense: 30 tablet; Refill: 0  2. Bipolar 1 disorder, mixed, moderate (HCC)  - ALPRAZolam (XANAX) 1 MG tablet; Take 1/2 to 1 tablet 3 x day if need for  anxiety of sleep  Dispense: 90 tablet; Refill: 1 - citalopram (CELEXA) 40 MG tablet; Take 1 tablet daily for Mood & Depression  Dispense: 90 tablet; Refill: 1  - Recc reschedule for f/u for PE & basic labs.

## 2015-11-12 ENCOUNTER — Ambulatory Visit (INDEPENDENT_AMBULATORY_CARE_PROVIDER_SITE_OTHER): Payer: Self-pay | Admitting: Internal Medicine

## 2015-11-12 ENCOUNTER — Encounter: Payer: Self-pay | Admitting: Internal Medicine

## 2015-11-12 VITALS — BP 130/66 | HR 88 | Temp 97.9°F | Resp 16 | Ht 74.5 in | Wt 187.2 lb

## 2015-11-12 DIAGNOSIS — E559 Vitamin D deficiency, unspecified: Secondary | ICD-10-CM | POA: Insufficient documentation

## 2015-11-12 DIAGNOSIS — R030 Elevated blood-pressure reading, without diagnosis of hypertension: Secondary | ICD-10-CM

## 2015-11-12 DIAGNOSIS — Z0001 Encounter for general adult medical examination with abnormal findings: Secondary | ICD-10-CM

## 2015-11-12 DIAGNOSIS — E782 Mixed hyperlipidemia: Secondary | ICD-10-CM | POA: Insufficient documentation

## 2015-11-12 DIAGNOSIS — IMO0001 Reserved for inherently not codable concepts without codable children: Secondary | ICD-10-CM

## 2015-11-12 DIAGNOSIS — R6889 Other general symptoms and signs: Secondary | ICD-10-CM

## 2015-11-12 DIAGNOSIS — E785 Hyperlipidemia, unspecified: Secondary | ICD-10-CM

## 2015-11-12 DIAGNOSIS — I480 Paroxysmal atrial fibrillation: Secondary | ICD-10-CM

## 2015-11-12 LAB — CBC WITH DIFFERENTIAL/PLATELET
BASOS PCT: 1 %
Basophils Absolute: 96 cells/uL (ref 0–200)
EOS PCT: 2 %
Eosinophils Absolute: 192 cells/uL (ref 15–500)
HEMATOCRIT: 42.3 % (ref 38.5–50.0)
Hemoglobin: 14 g/dL (ref 13.2–17.1)
LYMPHS ABS: 2400 {cells}/uL (ref 850–3900)
LYMPHS PCT: 25 %
MCH: 30 pg (ref 27.0–33.0)
MCHC: 33.1 g/dL (ref 32.0–36.0)
MCV: 90.6 fL (ref 80.0–100.0)
MONO ABS: 768 {cells}/uL (ref 200–950)
MPV: 11.2 fL (ref 7.5–12.5)
Monocytes Relative: 8 %
Neutro Abs: 6144 cells/uL (ref 1500–7800)
Neutrophils Relative %: 64 %
Platelets: 170 10*3/uL (ref 140–400)
RBC: 4.67 MIL/uL (ref 4.20–5.80)
RDW: 14.6 % (ref 11.0–15.0)
WBC: 9.6 10*3/uL (ref 3.8–10.8)

## 2015-11-12 LAB — TSH: TSH: 1.57 m[IU]/L (ref 0.40–4.50)

## 2015-11-12 NOTE — Progress Notes (Signed)
Patient ID: Bradley Ferguson, male   DOB: [birth year], 65 y.o.   MRN: [id number]  [facility]                       [name], M.D.        Uvaldo Bristle. Silverio Lay, P.A.-C       Starlyn Skeans, P.A.-C   [facility]                [address] [place], [place] [id number] Telephone [phone] Telefax [phone] _________________________________________________________________________   This very nice 65 y.o. [name] presents for follow up with hx/o labile elevated BP in the past and Hyperlipidemia. Patient if the past has re;lated sx's consistent with Raynaud's syndrome. He also has hx/o remote a crush type injury to the L thigh in 1976. Other problems include hx/o mixed bipolar type depression. Patient had recently been started on Citalopram, but d/c'd for unclear reasons.    Patient is monitored expectantly for treated for HTN & BP has been controlled and today's BP: 130/66 mmHg.  In Mar 2011, patient was found at an ER visit in Afib and was CV and when found in Afib a 2sd time in Nov 2011, he underwent RFA. Patient has had no complaints of any cardiac type chest pain, dyspnea/orthopnea/PND, dizziness, claudication  or dependent edema, but does report infreq  isolated palpitations at rest.   Hyperlipidemia is not controlled with diet & patient declines treatment due to lack of insurance .   Medication Sig  . ALPRAZolam (XANAX) 1 MG tablet Take 1/2 to 1 tablet 3 x day if need for anxiety of sleep  . citalopram (CELEXA) 40 MG tablet Take 1 tablet daily for Mood & Depression  . HYDROcodone-acetaminophen (NORCO) 5-325 MG tablet Take 1/2 to 1 tab every 4 hr if needed for pain   Allergies  Allergen Reactions  . Effexor [Venlafaxine]     Feels bad  . Wellbutrin [Bupropion]     sleepy  . Zoloft [Sertraline Hcl]     Felt bad   PMHx:   Past Medical History  Diagnosis Date  . Anxiety   . Blood transfusion  without reported diagnosis   . Cataract   . Depression   . Emphysema of lung ([place])   . Heart murmur    Immunization History  Administered Date(s) Administered  . Influenza Whole 04/30/2010  . Pneumococcal Polysaccharide-23 04/30/2010   Past Surgical History  Procedure Laterality Date  . Eye surgery    . Fracture surgery    . Atrial fibrillation ablation     FHx:    Reviewed / unchanged  SHx:    Reviewed / unchanged  Systems Review:  Constitutional: Denies fever, chills, wt changes, headaches, insomnia, fatigue, night sweats, change in appetite. Eyes: Denies redness, blurred vision, diplopia, discharge, itchy, watery eyes.  ENT: Denies discharge, congestion, post nasal drip, epistaxis, sore throat, earache, hearing loss, dental pain, tinnitus, vertigo, sinus pain, snoring.  CV: Denies chest pain, palpitations, irregular heartbeat, syncope, dyspnea, diaphoresis, orthopnea, PND, claudication or edema. Respiratory: denies cough, dyspnea, DOE, pleurisy, hoarseness, laryngitis, wheezing.  Gastrointestinal: Denies dysphagia, odynophagia, heartburn, reflux, water brash, abdominal pain or cramps, nausea, vomiting, bloating, diarrhea, constipation, hematemesis, melena, hematochezia  or hemorrhoids. Genitourinary: Denies dysuria, frequency, urgency, nocturia, hesitancy, discharge, hematuria or flank pain. Musculoskeletal: Denies arthralgias, myalgias, stiffness, jt. swelling,  pain, limping or strain/sprain.  Skin: Denies pruritus, rash, hives, warts, acne, eczema or change in skin lesion(s). Neuro: No weakness, tremor, incoordination, spasms, paresthesia or pain. Psychiatric: Denies confusion, memory loss or sensory loss. Endo: Denies change in weight, skin or hair change.  Heme/Lymph: No excessive bleeding, bruising or enlarged lymph nodes.  Physical Exam  BP 130/66 mmHg  Pulse 88  Temp(Src) 97.9 F (36.6 C)  Resp 16  Ht 6' 2.5" (1.892 m)  Wt 187 lb 3.2 oz (84.913 kg)  BMI 23.72  kg/m2  Appears well nourished and in no distress. Eyes: PERRLA, EOMs, conjunctiva no swelling or erythema. Sinuses: No frontal/maxillary tenderness ENT/Mouth: EAC's clear, TM's nl w/o erythema, bulging. Nares clear w/o erythema, swelling, exudates. Oropharynx clear without erythema or exudates. Oral hygiene is good. Tongue normal, non obstructing. Hearing intact.  Neck: Supple. Thyroid nl. Car 2+/2+ without bruits, nodes or JVD. Chest: Respirations nl with BS clear & equal w/o rales, rhonchi, wheezing or stridor.  Cor: Heart sounds normal w/ regular rate and rhythm without sig. murmurs, gallops, clicks, or rubs. Peripheral pulses normal and equal  without edema.  Abdomen: Soft & bowel sounds normal. Non-tender w/o guarding, rebound, hernias, masses, or organomegaly.  GU/DRE:  WNL. Prostate not enlarged & w/o nodules. Negative Hemmocult. Lymphatics: Unremarkable.  Musculoskeletal: Full ROM all peripheral extremities, joint stability, 5/5 strength, and normal gait.  Skin: Warm, dry without exposed rashes, lesions or ecchymosis apparent. (+) pigrented stasis pigmentation of the R lat shin area.  Neuro: Cranial nerves intact, reflexes equal bilaterally. Sensory-motor testing grossly intact. Tendon reflexes grossly intact.  Pysch: Alert & oriented x 3.  Insight and judgement nl & appropriate. No ideations.  Assessment and Plan:   1. Encounter for general adult medical examination with abnormal findings  - CBC with Differential/Platelet - TSH - Hemoglobin A1c  2. HTN, white coat   3. Paroxysmal atrial fibrillation (HCC)   4. Hyperlipidemia   5. Vitamin D deficiency  6. Depression   - advised to restart his Citalopram 40 mg at 1/2 taglet (20 mg) daily    Recommended regular exercise, BP monitoring, weight control, and discussed med and SE's. Recommended labs to assess and monitor clinical status. Further disposition pending results of labs. Over 30 minutes of exam, counseling,  chart review was performed. Discussed meds/SE's. ROV 3 mo.

## 2015-11-12 NOTE — Patient Instructions (Addendum)
Restart Citalopram 40 mg   at 1/2 tablet  / daily +++++++++++++++++++++++++++++++++++ Recommend Adult Low Dose Aspirin or   coated  Aspirin 81 mg daily    To reduce risk of Colon Cancer 20 %,   Skin Cancer 26 % ,   Melanoma 46%   and   Pancreatic cancer 60%   ++++++++++++++++++++++++++++++++++++++++++++++++++++++ Vitamin D goal   is between 70-100.   Please make sure that you are taking your Vitamin D as directed.   It is very important as a natural anti-inflammatory   helping hair, skin, and nails, as well as reducing stroke and heart attack risk.   It helps your bones and helps with mood.  It also decreases numerous cancer risks so please take it as directed.   Low Vit D is associated with a 200-300% higher risk for CANCER   and 200-300% higher risk for HEART   ATTACK  &  STROKE.   .....................................Marland Kitchen  It is also associated with higher death rate at younger ages,   autoimmune diseases like Rheumatoid arthritis, Lupus, Multiple Sclerosis.     Also many other serious conditions, like depression, Alzheimer's  Dementia, infertility, muscle aches, fatigue, fibromyalgia - just to name a few.  ++++++++++++++++++++++++++++++++++++++++++++++++  Recommend the book "The END of DIETING" by Dr Excell Seltzer   & the book "The END of DIABETES " by Dr Excell Seltzer  At [email or web address] - get book & Audio CD's     Being diabetic has a  300% increased risk for heart attack, stroke, cancer, and alzheimer- type vascular dementia. It is very important that you work harder with diet by avoiding all foods that are white. Avoid white rice (brown & wild rice is OK), white potatoes (sweetpotatoes in moderation is OK), White bread or wheat bread or anything made out of white flour like bagels, donuts, rolls, buns, biscuits, cakes, pastries, cookies, pizza crust, and pasta (made from white flour & egg whites) - vegetarian pasta or spinach or wheat pasta is OK. Multigrain  breads like Arnold's or Pepperidge [employer], or multigrain sandwich thins or flatbreads.  Diet, exercise and weight loss can reverse and cure diabetes in the early stages.  Diet, exercise and weight loss is very important in the control and prevention of complications of diabetes which affects every system in your body, ie. Brain - dementia/stroke, eyes - glaucoma/blindness, heart - heart attack/heart failure, kidneys - dialysis, stomach - gastric paralysis, intestines - malabsorption, nerves - severe painful neuritis, circulation - gangrene & loss of a leg(s), and finally cancer and Alzheimers.    I recommend avoid fried & greasy foods,  sweets/candy, white rice (brown or wild rice or Quinoa is OK), white potatoes (sweet potatoes are OK) - anything made from white flour - bagels, doughnuts, rolls, buns, biscuits,white and wheat breads, pizza crust and traditional pasta made of white flour & egg white(vegetarian pasta or spinach or wheat pasta is OK).  Multi-grain bread is OK - like multi-grain flat bread or sandwich thins. Avoid alcohol in excess. Exercise is also important.    Eat all the vegetables you want - avoid meat, especially red meat and dairy - especially cheese.  Cheese is the most concentrated form of trans-fats which is the worst thing to clog up our arteries. Veggie cheese is OK which can be found in the fresh produce section at [employer] or [employer] or [employer]  ++++++++++++++++++++++++++++++++++++++++++++++++++ DASH Eating Plan  DASH stands for "Dietary Approaches to Stop Hypertension."   The DASH eating  plan is a healthy eating plan that has been shown to reduce high blood pressure (hypertension). Additional health benefits may include reducing the risk of type 2 diabetes mellitus, heart disease, and stroke. The DASH eating plan may also help with weight loss.  WHAT DO I NEED TO KNOW ABOUT THE DASH EATING PLAN?  For the DASH eating plan, you will follow these general  guidelines:  Choose foods with a percent daily value for sodium of less than 5% (as listed on the food label).  Use salt-free seasonings or herbs instead of table salt or sea salt.  Check with your health care provider or pharmacist before using salt substitutes.  Eat lower-sodium products, often labeled as "lower sodium" or "no salt added."  Eat fresh foods.  Eat more vegetables, fruits, and low-fat dairy products.    Choose whole grains. Look for the word "whole" as the first word in the ingredient list.  Choose fish   Limit sweets, desserts, sugars, and sugary drinks.  Choose heart-healthy fats.  Eat veggie cheese   Eat more home-cooked food and less restaurant, buffet, and fast food.  Limit fried foods.  Cook foods using methods other than frying.  Limit canned vegetables. If you do use them, rinse them well to decrease the sodium.  When eating at a restaurant, ask that your food be prepared with less salt, or no salt if possible.                      WHAT FOODS CAN I EAT?  Read Dr Fara Olden Fuhrman's books on The End of Dieting & The End of Diabetes  Grains  Whole grain or whole wheat bread. Brown rice. Whole grain or whole wheat pasta. Quinoa, bulgur, and whole grain cereals. Low-sodium cereals. Corn or whole wheat flour tortillas. Whole grain cornbread. Whole grain crackers. Low-sodium crackers.  Vegetables  Fresh or frozen vegetables (raw, steamed, roasted, or grilled). Low-sodium or reduced-sodium tomato and vegetable juices. Low-sodium or reduced-sodium tomato sauce and paste. Low-sodium or reduced-sodium canned vegetables.   Fruits  All fresh, canned (in natural juice), or frozen fruits.  Protein Products   All fish and seafood.  Dried beans, peas, or lentils. Unsalted nuts and seeds. Unsalted canned beans.  Dairy  Low-fat dairy products, such as skim or 1% milk, 2% or reduced-fat cheeses, low-fat ricotta or cottage cheese, or plain low-fat yogurt.  Low-sodium or reduced-sodium cheeses.  Fats and Oils  Tub margarines without trans fats. Light or reduced-fat mayonnaise and salad dressings (reduced sodium). Avocado. Safflower, olive, or canola oils. Natural peanut or almond butter.  Other  Unsalted popcorn and pretzels. The items listed above may not be a complete list of recommended foods or beverages. Contact your dietitian for more options.  +++++++++++++++++++++++++++++++++++++++++++  WHAT FOODS ARE NOT RECOMMENDED?  Grains/ White flour or wheat flour  White bread. White pasta. White rice. Refined cornbread. Bagels and croissants. Crackers that contain trans fat.  Vegetables  Creamed or fried vegetables. Vegetables in a . Regular canned vegetables. Regular canned tomato sauce and paste. Regular tomato and vegetable juices.  Fruits  Dried fruits. Canned fruit in light or heavy syrup. Fruit juice.  Meat and Other Protein Products  Meat in general - RED mwaet & White meat.  Fatty cuts of meat. Ribs, chicken wings, bacon, sausage, bologna, salami, chitterlings, fatback, hot dogs, bratwurst, and packaged luncheon meats.  Dairy  Whole or 2% milk, cream, half-and-half, and cream cheese. Whole-fat or sweetened yogurt. Full-fat  cheeses or blue cheese. Nondairy creamers and whipped toppings. Processed cheese, cheese spreads, or cheese curds.  Condiments  Onion and garlic salt, seasoned salt, table salt, and sea salt. Canned and packaged gravies. Worcestershire sauce. Tartar sauce. Barbecue sauce. Teriyaki sauce. Soy sauce, including reduced sodium. Steak sauce. Fish sauce. Oyster sauce. Cocktail sauce. Horseradish. Ketchup and mustard. Meat flavorings and tenderizers. Bouillon cubes. Hot sauce. Tabasco sauce. Marinades. Taco seasonings. Relishes.  Fats and Oils Butter, stick margarine, lard, shortening and bacon fat. Coconut, palm kernel, or palm oils. Regular salad dressings.  Pickles and olives. Salted popcorn and  pretzels.  The items listed above may not be a complete list of foods and beverages to avoid.

## 2015-11-13 LAB — HEMOGLOBIN A1C
HEMOGLOBIN A1C: 5.9 % — AB (ref <5.7)
MEAN PLASMA GLUCOSE: 123 mg/dL

## 2016-02-29 ENCOUNTER — Ambulatory Visit (INDEPENDENT_AMBULATORY_CARE_PROVIDER_SITE_OTHER): Payer: Self-pay | Admitting: Internal Medicine

## 2016-02-29 ENCOUNTER — Encounter: Payer: Self-pay | Admitting: Internal Medicine

## 2016-02-29 VITALS — BP 100/60 | HR 64 | Temp 98.1°F | Resp 16 | Ht 74.5 in | Wt 183.2 lb

## 2016-02-29 DIAGNOSIS — R7309 Other abnormal glucose: Secondary | ICD-10-CM | POA: Insufficient documentation

## 2016-02-29 DIAGNOSIS — E785 Hyperlipidemia, unspecified: Secondary | ICD-10-CM

## 2016-02-29 DIAGNOSIS — N320 Bladder-neck obstruction: Secondary | ICD-10-CM | POA: Insufficient documentation

## 2016-02-29 DIAGNOSIS — R7303 Prediabetes: Secondary | ICD-10-CM

## 2016-02-29 DIAGNOSIS — I10 Essential (primary) hypertension: Secondary | ICD-10-CM

## 2016-02-29 DIAGNOSIS — Z125 Encounter for screening for malignant neoplasm of prostate: Secondary | ICD-10-CM

## 2016-02-29 DIAGNOSIS — R0989 Other specified symptoms and signs involving the circulatory and respiratory systems: Secondary | ICD-10-CM | POA: Insufficient documentation

## 2016-02-29 DIAGNOSIS — E559 Vitamin D deficiency, unspecified: Secondary | ICD-10-CM

## 2016-02-29 LAB — LIPID PANEL
Cholesterol: 203 mg/dL — ABNORMAL HIGH (ref 125–200)
HDL: 36 mg/dL — AB (ref >=40)
LDL Cholesterol: 134 mg/dL — ABNORMAL HIGH (ref <130)
TRIGLYCERIDES: 164 mg/dL — AB (ref <150)
Total CHOL/HDL Ratio: 5.6 Ratio — ABNORMAL HIGH (ref <=5.0)
VLDL: 33 mg/dL — ABNORMAL HIGH (ref <30)

## 2016-02-29 NOTE — Patient Instructions (Addendum)
Recommend Adult Low Dose Aspirin or  coated  Aspirin 81 mg daily  To reduce risk of Colon Cancer 20 %,  Skin Cancer 26 % ,  Melanoma 46%  and  Pancreatic cancer 60% ++++++++++++++++++++++++++++++++++++++++++++++++++++++ Vitamin D goal  is between 70-100.  Please make sure that you are taking your Vitamin D as directed.  It is very important as a natural anti-inflammatory  helping hair, skin, and nails, as well as reducing stroke and heart attack risk.  It helps your bones and helps with mood. It also decreases numerous cancer risks so please take it as directed.  Low Vit D is associated with a 200-300% higher risk for CANCER  and 200-300% higher risk for HEART   ATTACK  &  STROKE.   ...................................... It is also associated with higher death rate at younger ages,  autoimmune diseases like Rheumatoid arthritis, Lupus, Multiple Sclerosis.    Also many other serious conditions, like depression, Alzheimer's Dementia, infertility, muscle aches, fatigue, fibromyalgia - just to name a few. ++++++++++++++++++++++++++++++++++++++++++++++++ Recommend the book "The END of DIETING" by Dr Joel Fuhrman  & the book "The END of DIABETES " by Dr Joel Fuhrman At [employer] - get book & Audio CD's    Being diabetic has a  300% increased risk for heart attack, stroke, cancer, and alzheimer- type vascular dementia. It is very important that you work harder with diet by avoiding all foods that are white. Avoid white rice (brown & wild rice is OK), white potatoes (sweetpotatoes in moderation is OK), White bread or wheat bread or anything made out of white flour like bagels, donuts, rolls, buns, biscuits, cakes, pastries, cookies, pizza crust, and pasta (made from white flour & egg whites) - vegetarian pasta or spinach or wheat pasta is OK. Multigrain breads like Arnold's or Pepperidge Farm, or multigrain sandwich thins or flatbreads.  Diet, exercise and weight loss can reverse and cure diabetes  in the early stages.  Diet, exercise and weight loss is very important in the control and prevention of complications of diabetes which affects every system in your body, ie. Brain - dementia/stroke, eyes - glaucoma/blindness, heart - heart attack/heart failure, kidneys - dialysis, stomach - gastric paralysis, intestines - malabsorption, nerves - severe painful neuritis, circulation - gangrene & loss of a leg(s), and finally cancer and Alzheimers.    I recommend avoid fried & greasy foods,  sweets/candy, white rice (brown or wild rice or Quinoa is OK), white potatoes (sweet potatoes are OK) - anything made from white flour - bagels, doughnuts, rolls, buns, biscuits,white and wheat breads, pizza crust and traditional pasta made of white flour & egg white(vegetarian pasta or spinach or wheat pasta is OK).  Multi-grain bread is OK - like multi-grain flat bread or sandwich thins. Avoid alcohol in excess. Exercise is also important.    Eat all the vegetables you want - avoid meat, especially red meat and dairy - especially cheese.  Cheese is the most concentrated form of trans-fats which is the worst thing to clog up our arteries. Veggie cheese is OK which can be found in the fresh produce section at [employer] or [employer] or [employer]  ++++++++++++++++++++++++++++++++++++++++++++++++++ DASH Eating Plan  DASH stands for "Dietary Approaches to Stop Hypertension."   The DASH eating plan is a healthy eating plan that has been shown to reduce high blood pressure (hypertension). Additional health benefits may include reducing the risk of type 2 diabetes mellitus, heart disease, and stroke. The DASH eating plan may also   help with weight loss. WHAT DO I NEED TO KNOW ABOUT THE DASH EATING PLAN? For the DASH eating plan, you will follow these general guidelines:  Choose foods with a percent daily value for sodium of less than 5% (as listed on the food label).  Use salt-free seasonings or herbs instead of  table salt or sea salt.  Check with your health care provider or pharmacist before using salt substitutes.  Eat lower-sodium products, often labeled as "lower sodium" or "no salt added."  Eat fresh foods.  Eat more vegetables, fruits, and low-fat dairy products.  Choose whole grains. Look for the word "whole" as the first word in the ingredient list.  Choose fish   Limit sweets, desserts, sugars, and sugary drinks.  Choose heart-healthy fats.  Eat veggie cheese   Eat more home-cooked food and less restaurant, buffet, and fast food.  Limit fried foods.  Cook foods using methods other than frying.  Limit canned vegetables. If you do use them, rinse them well to decrease the sodium.  When eating at a restaurant, ask that your food be prepared with less salt, or no salt if possible.                      WHAT FOODS CAN I EAT? Read Dr Joel Fuhrman's books on The End of Dieting & The End of Diabetes  Grains Whole grain or whole wheat bread. Brown rice. Whole grain or whole wheat pasta. Quinoa, bulgur, and whole grain cereals. Low-sodium cereals. Corn or whole wheat flour tortillas. Whole grain cornbread. Whole grain crackers. Low-sodium crackers.  Vegetables Fresh or frozen vegetables (raw, steamed, roasted, or grilled). Low-sodium or reduced-sodium tomato and vegetable juices. Low-sodium or reduced-sodium tomato sauce and paste. Low-sodium or reduced-sodium canned vegetables.   Fruits All fresh, canned (in natural juice), or frozen fruits.  Protein Products  All fish and seafood.  Dried beans, peas, or lentils. Unsalted nuts and seeds. Unsalted canned beans.  Dairy Low-fat dairy products, such as skim or 1% milk, 2% or reduced-fat cheeses, low-fat ricotta or cottage cheese, or plain low-fat yogurt. Low-sodium or reduced-sodium cheeses.  Fats and Oils Tub margarines without trans fats. Light or reduced-fat mayonnaise and salad dressings (reduced sodium). Avocado. Safflower,  olive, or canola oils. Natural peanut or almond butter.  Other Unsalted popcorn and pretzels. The items listed above may not be a complete list of recommended foods or beverages. Contact your dietitian for more options.  +++++++++++++++++++++++++++++++++++++++++++  WHAT FOODS ARE NOT RECOMMENDED? Grains/ White flour or wheat flour White bread. White pasta. White rice. Refined cornbread. Bagels and croissants. Crackers that contain trans fat.  Vegetables  Creamed or fried vegetables. Vegetables in a . Regular canned vegetables. Regular canned tomato sauce and paste. Regular tomato and vegetable juices.  Fruits Dried fruits. Canned fruit in light or heavy syrup. Fruit juice.  Meat and Other Protein Products Meat in general - RED meat & White meat.  Fatty cuts of meat. Ribs, chicken wings, all processed meats as bacon, sausage, bologna, salami, fatback, hot dogs, bratwurst and packaged luncheon meats.  Dairy Whole or 2% milk, cream, half-and-half, and cream cheese. Whole-fat or sweetened yogurt. Full-fat cheeses or blue cheese. Non-dairy creamers and whipped toppings. Processed cheese, cheese spreads, or cheese curds.  Condiments Onion and garlic salt, seasoned salt, table salt, and sea salt. Canned and packaged gravies. Worcestershire sauce. Tartar sauce. Barbecue sauce. Teriyaki sauce. Soy sauce, including reduced sodium. Steak sauce. Fish sauce. Oyster sauce. Cocktail   sauce. Horseradish. Ketchup and mustard. Meat flavorings and tenderizers. Bouillon cubes. Hot sauce. Tabasco sauce. Marinades. Taco seasonings. Relishes.  Fats and Oils Butter, stick margarine, lard, shortening and bacon fat. Coconut, palm kernel, or palm oils. Regular salad dressings.  Pickles and olives. Salted popcorn and pretzels.  The items listed above may not be a complete list of foods and beverages to avoid.  Recommend Adult Low Dose Aspirin or  coated  Aspirin 81 mg daily  To reduce risk of Colon Cancer 20  %,  Skin Cancer 26 % ,  Melanoma 46%  and  Pancreatic cancer 60% ++++++++++++++++++++++++++++++++++++++++++++++++++++++ Vitamin D goal  is between 70-100.  Please make sure that you are taking your Vitamin D as directed.  It is very important as a natural anti-inflammatory  helping hair, skin, and nails, as well as reducing stroke and heart attack risk.  It helps your bones and helps with mood. It also decreases numerous cancer risks so please take it as directed.  Low Vit D is associated with a 200-300% higher risk for CANCER  and 200-300% higher risk for HEART   ATTACK  &  STROKE.   ...................................... It is also associated with higher death rate at younger ages,  autoimmune diseases like Rheumatoid arthritis, Lupus, Multiple Sclerosis.    Also many other serious conditions, like depression, Alzheimer's Dementia, infertility, muscle aches, fatigue, fibromyalgia - just to name a few. ++++++++++++++++++++++++++++++++++++++++++++++++ Recommend the book "The END of DIETING" by Dr Joel Fuhrman  & the book "The END of DIABETES " by Dr Joel Fuhrman At [employer] - get book & Audio CD's    Being diabetic has a  300% increased risk for heart attack, stroke, cancer, and alzheimer- type vascular dementia. It is very important that you work harder with diet by avoiding all foods that are white. Avoid white rice (brown & wild rice is OK), white potatoes (sweetpotatoes in moderation is OK), White bread or wheat bread or anything made out of white flour like bagels, donuts, rolls, buns, biscuits, cakes, pastries, cookies, pizza crust, and pasta (made from white flour & egg whites) - vegetarian pasta or spinach or wheat pasta is OK. Multigrain breads like Arnold's or Pepperidge [employer], or multigrain sandwich thins or flatbreads.  Diet, exercise and weight loss can reverse and cure diabetes in the early stages.  Diet, exercise and weight loss is very important in the control and prevention  of complications of diabetes which affects every system in your body, ie. Brain - dementia/stroke, eyes - glaucoma/blindness, heart - heart attack/heart failure, kidneys - dialysis, stomach - gastric paralysis, intestines - malabsorption, nerves - severe painful neuritis, circulation - gangrene & loss of a leg(s), and finally cancer and Alzheimers.    I recommend avoid fried & greasy foods,  sweets/candy, white rice (brown or wild rice or Quinoa is OK), white potatoes (sweet potatoes are OK) - anything made from white flour - bagels, doughnuts, rolls, buns, biscuits,white and wheat breads, pizza crust and traditional pasta made of white flour & egg white(vegetarian pasta or spinach or wheat pasta is OK).  Multi-grain bread is OK - like multi-grain flat bread or sandwich thins. Avoid alcohol in excess. Exercise is also important.    Eat all the vegetables you want - avoid meat, especially red meat and dairy - especially cheese.  Cheese is the most concentrated form of trans-fats which is the worst thing to clog up our arteries. Veggie cheese is OK which can be found in the fresh   produce section at [employer] or [employer] or [employer]  ++++++++++++++++++++++++++++++++++++++++++++++++++ DASH Eating Plan  DASH stands for "Dietary Approaches to Stop Hypertension."   The DASH eating plan is a healthy eating plan that has been shown to reduce high blood pressure (hypertension). Additional health benefits may include reducing the risk of type 2 diabetes mellitus, heart disease, and stroke. The DASH eating plan may also help with weight loss. WHAT DO I NEED TO KNOW ABOUT THE DASH EATING PLAN? For the DASH eating plan, you will follow these general guidelines:  Choose foods with a percent daily value for sodium of less than 5% (as listed on the food label).  Use salt-free seasonings or herbs instead of table salt or sea salt.  Check with your health care provider or pharmacist before using salt  substitutes.  Eat lower-sodium products, often labeled as "lower sodium" or "no salt added."  Eat fresh foods.  Eat more vegetables, fruits, and low-fat dairy products.  Choose whole grains. Look for the word "whole" as the first word in the ingredient list.  Choose fish   Limit sweets, desserts, sugars, and sugary drinks.  Choose heart-healthy fats.  Eat veggie cheese   Eat more home-cooked food and less restaurant, buffet, and fast food.  Limit fried foods.  Cook foods using methods other than frying.  Limit canned vegetables. If you do use them, rinse them well to decrease the sodium.  When eating at a restaurant, ask that your food be prepared with less salt, or no salt if possible.                      WHAT FOODS CAN I EAT? Read Dr Joel Fuhrman's books on The End of Dieting & The End of Diabetes  Grains Whole grain or whole wheat bread. Brown rice. Whole grain or whole wheat pasta. Quinoa, bulgur, and whole grain cereals. Low-sodium cereals. Corn or whole wheat flour tortillas. Whole grain cornbread. Whole grain crackers. Low-sodium crackers.  Vegetables Fresh or frozen vegetables (raw, steamed, roasted, or grilled). Low-sodium or reduced-sodium tomato and vegetable juices. Low-sodium or reduced-sodium tomato sauce and paste. Low-sodium or reduced-sodium canned vegetables.   Fruits All fresh, canned (in natural juice), or frozen fruits.  Protein Products  All fish and seafood.  Dried beans, peas, or lentils. Unsalted nuts and seeds. Unsalted canned beans.  Dairy Low-fat dairy products, such as skim or 1% milk, 2% or reduced-fat cheeses, low-fat ricotta or cottage cheese, or plain low-fat yogurt. Low-sodium or reduced-sodium cheeses.  Fats and Oils Tub margarines without trans fats. Light or reduced-fat mayonnaise and salad dressings (reduced sodium). Avocado. Safflower, olive, or canola oils. Natural peanut or almond butter.  Other Unsalted popcorn and  pretzels. The items listed above may not be a complete list of recommended foods or beverages. Contact your dietitian for more options.  +++++++++++++++++++++++++++++++++++++++++++  WHAT FOODS ARE NOT RECOMMENDED? Grains/ White flour or wheat flour White bread. White pasta. White rice. Refined cornbread. Bagels and croissants. Crackers that contain trans fat.  Vegetables  Creamed or fried vegetables. Vegetables in a . Regular canned vegetables. Regular canned tomato sauce and paste. Regular tomato and vegetable juices.  Fruits Dried fruits. Canned fruit in light or heavy syrup. Fruit juice.  Meat and Other Protein Products Meat in general - RED meat & White meat.  Fatty cuts of meat. Ribs, chicken wings, all processed meats as bacon, sausage, bologna, salami, fatback, hot dogs, bratwurst and packaged luncheon meats.  Dairy   Whole or 2% milk, cream, half-and-half, and cream cheese. Whole-fat or sweetened yogurt. Full-fat cheeses or blue cheese. Non-dairy creamers and whipped toppings. Processed cheese, cheese spreads, or cheese curds.  Condiments Onion and garlic salt, seasoned salt, table salt, and sea salt. Canned and packaged gravies. Worcestershire sauce. Tartar sauce. Barbecue sauce. Teriyaki sauce. Soy sauce, including reduced sodium. Steak sauce. Fish sauce. Oyster sauce. Cocktail sauce. Horseradish. Ketchup and mustard. Meat flavorings and tenderizers. Bouillon cubes. Hot sauce. Tabasco sauce. Marinades. Taco seasonings. Relishes.  Fats and Oils Butter, stick margarine, lard, shortening and bacon fat. Coconut, palm kernel, or palm oils. Regular salad dressings.  Pickles and olives. Salted popcorn and pretzels.  The items listed above may not be a complete list of foods and beverages to avoid.   

## 2016-02-29 NOTE — Progress Notes (Signed)
[facility]                       [name], M.D.        Uvaldo Bristle. Silverio Lay, P.A.-C       Starlyn Skeans, P.A.-C   [facility]                [address] [place], [place] [id number] Telephone [phone] Telefax [phone] ______________________________________________________________________     This very nice 65y.o.[name] presents for follow up with Hypertension, Hyperlipidemia, Pre-Diabetes and Vitamin D Deficiency.      Patient has hx/o labile HTN and is followed expectantly & BP has been controlled and today's BP is 100/60. Patient has had no complaints of any cardiac type chest pain, palpitations, dyspnea/orthopnea/PND, dizziness, claudication, or dependent edema.     Hyperlipidemia is controlled with diet & meds. Patient denies myalgias or other med SE's. Last Lipids were at goal: Lab Results  Component Value Date   CHOL 168 04/29/2010   HDL 38 05/06/2010   LDL 113 05/06/2010   TRIG 168 05/06/2010   CHOLHDL 4.4 05/06/2010      Also, the patient has history of PreDiabetes and has had no symptoms of reactive hypoglycemia, diabetic polys, paresthesias or visual blurring.  Last A1c was not at goal: Lab Results  Component Value Date   HGBA1C 5.9 (H) 11/12/2015      Further, the patient also has history of Vitamin D Deficiency and supplements vitamin D without any suspected side-effects.  Current Outpatient Prescriptions on File Prior to Visit  Medication Sig  . ALPRAZolam (XANAX) 1 MG tablet Take 1/2 to 1 tablet 3 x day if need for anxiety of sleep  . citalopram (CELEXA) 40 MG tablet Take 1 tablet daily for Mood & Depression   Allergies  Allergen Reactions  . Effexor [Venlafaxine]     Feels bad  . Wellbutrin [Bupropion]     sleepy  . Zoloft [Sertraline Hcl]     Felt bad   PMHx:   Past Medical History:  Diagnosis Date  . Anxiety   . Blood transfusion without reported diagnosis    . Cataract   . Depression   . Emphysema of lung ([place])   . Heart murmur    Immunization History  Administered Date(s) Administered  . Influenza Whole 04/30/2010  . Pneumococcal Polysaccharide-23 04/30/2010   Past Surgical History:  Procedure Laterality Date  . ATRIAL FIBRILLATION ABLATION    . EYE SURGERY    . FRACTURE SURGERY     FHx:    Reviewed / unchanged  SHx:    Reviewed / unchanged  Systems Review:  Constitutional: Denies fever, chills, wt changes, headaches, insomnia, fatigue, night sweats, change in appetite. Eyes: Denies redness, blurred vision, diplopia, discharge, itchy, watery eyes.  ENT: Denies discharge, congestion, post nasal drip, epistaxis, sore throat, earache, hearing loss, dental pain, tinnitus, vertigo, sinus pain, snoring.  CV: Denies chest pain, palpitations, irregular heartbeat, syncope, dyspnea, diaphoresis, orthopnea, PND, claudication or edema. Respiratory: denies cough, dyspnea, DOE, pleurisy, hoarseness, laryngitis, wheezing.  Gastrointestinal: Denies dysphagia, odynophagia, heartburn, reflux, water brash, abdominal pain or cramps, nausea, vomiting, bloating, diarrhea, constipation, hematemesis, melena, hematochezia  or hemorrhoids. Genitourinary: Denies dysuria, frequency, urgency, nocturia, hesitancy, discharge, hematuria or flank pain. Musculoskeletal: Denies arthralgias, myalgias, stiffness, jt. swelling, pain, limping or strain/sprain.  Skin: Denies pruritus,  rash, hives, warts, acne, eczema or change in skin lesion(s). Neuro: No weakness, tremor, incoordination, spasms, paresthesia or pain. Psychiatric: Denies confusion, memory loss or sensory loss. Endo: Denies change in weight, skin or hair change.  Heme/Lymph: No excessive bleeding, bruising or enlarged lymph nodes.  Physical Exam BP 100/60   Pulse 64   Temp 98.1 F (36.7 C)   Resp 16   Ht 6' 2.5" (1.892 m)   Wt 183 lb 3.2 oz (83.1 kg)   BMI 23.21 kg/m   Appears chronically ill  and older than stated age and in no distress.  Eyes: PERRLA, EOMs, conjunctiva no swelling or erythema. Sinuses: No frontal/maxillary tenderness ENT/Mouth: EAC's clear, TM's nl w/o erythema, bulging. Nares clear w/o erythema, swelling, exudates. Oropharynx clear without erythema or exudates. Oral hygiene is good. Tongue normal, non obstructing. Hearing intact.  Neck: Supple. Thyroid nl. Car 2+/2+ without bruits, nodes or JVD. Chest: Respirations nl with BS clear & equal w/o rales, rhonchi, wheezing or stridor.  Cor: Heart sounds normal w/ regular rate and rhythm without sig. murmurs, gallops, clicks, or rubs. Peripheral pulses normal and equal  without edema.  Abdomen: Soft & bowel sounds normal. Non-tender w/o guarding, rebound, hernias, masses, or organomegaly.  Lymphatics: Unremarkable.  Musculoskeletal: Full ROM all peripheral extremities, joint stability, 5/5 strength, and normal gait.  Skin: Warm, dry without exposed rashes, lesions or ecchymosis apparent.  Neuro: Cranial nerves intact, reflexes equal bilaterally. Sensory-motor testing grossly intact. Tendon reflexes grossly intact.  Pysch: Alert & oriented x 3.  Insight and judgement nl & appropriate. No ideations.  Assessment and Plan:   1. Essential hypertension  - Continue  monitor blood pressure at home. Recommended DASH diet. Reminder to go to the ER if any CP, SOB, nausea, dizziness, severe HA, changes vision/speech, left arm numbness and tingling and jaw pain.  2. Hyperlipidemia  - Continue diet, exercise,& lifestyle modifications. Continue monitor periodic cholesterol  functions  - Lipid panel  3. Prediabetes  - Continue diet, exercise, lifestyle modifications. Monitor appropriate labs. - Hemoglobin A1c  4. Vitamin D deficiency  - Recommended start  supplementation. - VITAMIN D 25 Hydroxy   5. BPH  - PSA     Recommended regular exercise, BP monitoring, weight control, and discussed med and SE's. Recommended  labs to assess and monitor clinical status. Further disposition pending results of labs. Over 30 minutes of exam, counseling, chart review was performed

## 2016-03-01 LAB — HEMOGLOBIN A1C
HEMOGLOBIN A1C: 5.4 % (ref <5.7)
MEAN PLASMA GLUCOSE: 108 mg/dL

## 2016-03-01 LAB — PSA: PSA: 0.6 ng/mL (ref <=4.0)

## 2016-03-01 LAB — VITAMIN D 25 HYDROXY (VIT D DEFICIENCY, FRACTURES): VIT D 25 HYDROXY: 28 ng/mL — AB (ref 30–100)

## 2016-03-03 ENCOUNTER — Encounter: Payer: Self-pay | Admitting: Internal Medicine

## 2016-06-03 ENCOUNTER — Other Ambulatory Visit: Payer: Self-pay | Admitting: *Deleted

## 2016-06-03 DIAGNOSIS — Z1212 Encounter for screening for malignant neoplasm of rectum: Secondary | ICD-10-CM

## 2016-06-03 LAB — POC HEMOCCULT BLD/STL (HOME/3-CARD/SCREEN)
Card #2 Fecal Occult Blod, POC: NEGATIVE
Card #3 Fecal Occult Blood, POC: NEGATIVE
FECAL OCCULT BLD: NEGATIVE

## 2016-06-09 ENCOUNTER — Ambulatory Visit: Payer: Self-pay | Admitting: Internal Medicine

## 2017-02-05 ENCOUNTER — Encounter: Payer: Self-pay | Admitting: Internal Medicine

## 2017-02-05 ENCOUNTER — Ambulatory Visit (INDEPENDENT_AMBULATORY_CARE_PROVIDER_SITE_OTHER): Payer: Medicare Other | Admitting: Internal Medicine

## 2017-02-05 VITALS — BP 110/58 | HR 72 | Temp 97.3°F | Resp 16 | Ht 74.0 in | Wt 198.0 lb

## 2017-02-05 DIAGNOSIS — R6889 Other general symptoms and signs: Secondary | ICD-10-CM | POA: Diagnosis not present

## 2017-02-05 DIAGNOSIS — Z79899 Other long term (current) drug therapy: Secondary | ICD-10-CM | POA: Diagnosis not present

## 2017-02-05 DIAGNOSIS — E559 Vitamin D deficiency, unspecified: Secondary | ICD-10-CM

## 2017-02-05 DIAGNOSIS — Z1212 Encounter for screening for malignant neoplasm of rectum: Secondary | ICD-10-CM

## 2017-02-05 DIAGNOSIS — E782 Mixed hyperlipidemia: Secondary | ICD-10-CM | POA: Diagnosis not present

## 2017-02-05 DIAGNOSIS — Z23 Encounter for immunization: Secondary | ICD-10-CM

## 2017-02-05 DIAGNOSIS — N401 Enlarged prostate with lower urinary tract symptoms: Secondary | ICD-10-CM

## 2017-02-05 DIAGNOSIS — Z125 Encounter for screening for malignant neoplasm of prostate: Secondary | ICD-10-CM

## 2017-02-05 DIAGNOSIS — F3162 Bipolar disorder, current episode mixed, moderate: Secondary | ICD-10-CM

## 2017-02-05 DIAGNOSIS — I10 Essential (primary) hypertension: Secondary | ICD-10-CM | POA: Diagnosis not present

## 2017-02-05 DIAGNOSIS — Z1211 Encounter for screening for malignant neoplasm of colon: Secondary | ICD-10-CM

## 2017-02-05 DIAGNOSIS — Z0001 Encounter for general adult medical examination with abnormal findings: Secondary | ICD-10-CM | POA: Diagnosis not present

## 2017-02-05 DIAGNOSIS — Z136 Encounter for screening for cardiovascular disorders: Secondary | ICD-10-CM

## 2017-02-05 DIAGNOSIS — R7303 Prediabetes: Secondary | ICD-10-CM

## 2017-02-05 LAB — BASIC METABOLIC PANEL WITH GFR
BUN: 14 mg/dL (ref 7–25)
CO2: 24 mmol/L (ref 20–32)
Calcium: 9.2 mg/dL (ref 8.6–10.3)
Chloride: 104 mmol/L (ref 98–110)
Creat: 1.04 mg/dL (ref 0.70–1.25)
GFR, EST AFRICAN AMERICAN: 87 mL/min (ref >=60)
GFR, EST NON AFRICAN AMERICAN: 75 mL/min (ref >=60)
Glucose, Bld: 118 mg/dL — ABNORMAL HIGH (ref 65–99)
POTASSIUM: 4.8 mmol/L (ref 3.5–5.3)
SODIUM: 138 mmol/L (ref 135–146)

## 2017-02-05 LAB — CBC WITH DIFFERENTIAL/PLATELET
BASOS PCT: 0 %
Basophils Absolute: 0 cells/uL (ref 0–200)
EOS PCT: 4 %
Eosinophils Absolute: 280 cells/uL (ref 15–500)
HCT: 41.4 % (ref 38.5–50.0)
HEMOGLOBIN: 13.7 g/dL (ref 13.2–17.1)
LYMPHS ABS: 1820 {cells}/uL (ref 850–3900)
Lymphocytes Relative: 26 %
MCH: 30.5 pg (ref 27.0–33.0)
MCHC: 33.1 g/dL (ref 32.0–36.0)
MCV: 92.2 fL (ref 80.0–100.0)
MPV: 10.9 fL (ref 7.5–12.5)
Monocytes Absolute: 630 cells/uL (ref 200–950)
Monocytes Relative: 9 %
NEUTROS ABS: 4270 {cells}/uL (ref 1500–7800)
NEUTROS PCT: 61 %
Platelets: 165 10*3/uL (ref 140–400)
RBC: 4.49 MIL/uL (ref 4.20–5.80)
RDW: 14.6 % (ref 11.0–15.0)
WBC: 7 10*3/uL (ref 3.8–10.8)

## 2017-02-05 LAB — LIPID PANEL
CHOLESTEROL: 247 mg/dL — AB (ref <200)
HDL: 40 mg/dL — AB (ref >40)
LDL Cholesterol: 172 mg/dL — ABNORMAL HIGH (ref <100)
TRIGLYCERIDES: 177 mg/dL — AB (ref <150)
Total CHOL/HDL Ratio: 6.2 Ratio — ABNORMAL HIGH (ref <5.0)
VLDL: 35 mg/dL — ABNORMAL HIGH (ref <30)

## 2017-02-05 LAB — HEPATIC FUNCTION PANEL
ALT: 13 U/L (ref 9–46)
AST: 18 U/L (ref 10–35)
Albumin: 4.1 g/dL (ref 3.6–5.1)
Alkaline Phosphatase: 55 U/L (ref 40–115)
BILIRUBIN DIRECT: 0.1 mg/dL (ref <=0.2)
BILIRUBIN INDIRECT: 0.4 mg/dL (ref 0.2–1.2)
Total Bilirubin: 0.5 mg/dL (ref 0.2–1.2)
Total Protein: 6.5 g/dL (ref 6.1–8.1)

## 2017-02-05 LAB — TSH: TSH: 1.27 m[IU]/L (ref 0.40–4.50)

## 2017-02-05 MED ORDER — ALPRAZOLAM 1 MG PO TABS: ORAL_TABLET | ORAL | 2 refills | Status: AC

## 2017-02-05 MED ORDER — CITALOPRAM HYDROBROMIDE 20 MG PO TABS: ORAL_TABLET | ORAL | 3 refills | Status: DC

## 2017-02-05 NOTE — Progress Notes (Signed)
MEDICARE ANNUAL WELLNESS VISIT AND CPE  Assessment:   1. Encounter for general adult medical examination with abnormal findings   2. Essential hypertension  - EKG 12-Lead - [place], RETROPERITNL ABD,  LTD - Urinalysis, Routine w reflex microscopic - Microalbumin / creatinine urine ratio - CBC with Differential/Platelet - BASIC METABOLIC PANEL WITH GFR - Magnesium - TSH  3. Hyperlipidemia, mixed  - EKG 12-Lead - [place], RETROPERITNL ABD,  LTD - Hepatic function panel - Lipid panel - TSH  4. Prediabetes  - EKG 12-Lead - [place], RETROPERITNL ABD,  LTD - Hemoglobin A1c - Insulin, random  5. Vitamin D deficiency  - VITAMIN D 25 Hydroxy (Vit-D Deficiency, Fractures)  6. Benign localized prostatic hyperplasia with lower urinary tract symptoms (LUTS)  - PSA  7. Bipolar 1 disorder, mixed, moderate (HCC)  - ALPRAZolam (XANAX) 1 MG tablet; Take 1/2 to 1 tablet for sleep  Dispense: 30 tablet; Refill: 2  8. Screening for ischemic heart disease  - EKG 12-Lead - Lipid panel  9. Screening for AAA (aortic abdominal aneurysm)  - [place], RETROPERITNL ABD,  LTD  10. Encounter for colorectal cancer screening  - POC Hemoccult Bld/Stl (3-Cd Home Screen); Future  11. Prostate cancer screening  - PSA  12. Medication management  - Urinalysis, Routine w reflex microscopic - Microalbumin / creatinine urine ratio - CBC with Differential/Platelet - BASIC METABOLIC PANEL WITH GFR - Hepatic function panel - Magnesium - Lipid panel - TSH - Hemoglobin A1c - Insulin, random - VITAMIN D 25 Hydroxy (Vit-D Deficiency, Fractures)  13. Need for prophylactic vaccination against Streptococcus pneumoniae (pneumococcus)  - Pneumococcal conjugate vaccine 13-valent  14. Need for Zostavax administration  - Varicella zoster antibody, IgG  Over 40 minutes of exam, counseling, chart review and critical decision making was performed Future Appointments Date Time Provider [place]   02/26/2017 4:15 PM [name], MD [facility] None  03/20/2017 11:15 AM [name], MD [facility] None  02/23/2018 2:00 PM [name], MD [facility] None     Plan:   During the course of the visit the patient was educated and counseled about appropriate screening and preventive services including:    Pneumococcal vaccine   Prevnar 13  Influenza vaccine  Td vaccine  Screening electrocardiogram  Bone densitometry screening  Colorectal cancer screening  Diabetes screening  Glaucoma screening  Nutrition counseling   Advanced directives: requested   Subjective:  Bradley Ferguson is a 66 y.o. male who presents for Welcome to Medicare Visit  and complete physical.    He has been monitored expectantly for years for labile elevated blood pressure. His blood pressure has been controlled at home, today their BP is at goal at 110/58.  In Mar 2011, patient was found at an ER visit in Afib and was CV and when found in Afib a 2sd time in Nov 2011, he underwent RFA.  He does not workout. He denies chest pain, shortness of breath, dizziness.   His cholesterol is not at goal and he is reticent to taking medications for cholesterol.  Hyperlipidemia is not controlled with diet & patient declines treatment due to lack of insurance .  Lab Results  Component Value Date   CHOL 247 (H) 02/05/2017   HDL 40 (L) 02/05/2017   LDLCALC 172 (H) 02/05/2017   TRIG 177 (H) 02/05/2017   CHOLHDL 6.2 (H) 02/05/2017   He has had hx/o prediabetes for years. He has not been working on diet and exercise and denies foot ulcerations, increased  appetite, nausea, paresthesia of the feet, polydipsia, polyuria, visual disturbances and weight loss. Last A1C in the office was at goal:  Lab Results  Component Value Date   HGBA1C 5.4 02/29/2016  Patient if the past has related sx's consistent with Raynaud's syndrome. He also has hx/o remote a crush type injury to the L thigh in 1976. Other problems  include hx/o mixed bipolar type depression. Last GFR:   Lab Results  Component Value Date   GFRNONAA 75 02/05/2017   Lab Results  Component Value Date   GFRAA 87 02/05/2017   Patient has hx/o Vit D Deficiency and is reticent to taking Vit D supplements.   Lab Results  Component Value Date   VD25OH 28 (L) 02/29/2016     Medication Review: No current outpatient prescriptions on file prior to visit.   No current facility-administered medications on file prior to visit.    Allergies  Allergen Reactions  . Effexor [Venlafaxine]     Feels bad  . Wellbutrin [Bupropion]     sleepy  . Zoloft [Sertraline Hcl]     Felt bad   Current Problems (verified) Patient Active Problem List   Diagnosis Date Noted  . Labile HTN 02/29/2016  . Prediabetes 02/29/2016  . BPH 02/29/2016  . Hyperlipidemia 11/12/2015  . Vitamin D deficiency 11/12/2015  . Bipolar I disorder ([place]) 08/13/2015  . Atrial fibrillation ([place]) 08/13/2015  . Attention deficit disorder 08/08/2010  . VISUAL ACUITY, DECREASED 08/08/2010  . Dental caries 08/08/2010  . ANXIETY DEPRESSION 06/06/2010  . TOBACCO ABUSE 06/06/2010  . RAYNAUDS SYNDROME 06/06/2010  . VENOUS STASIS ULCER 06/06/2010    Screening Tests Immunization History  Administered Date(s) Administered  . Influenza Whole 04/30/2010  . Pneumococcal Conjugate-13 02/05/2017  . Pneumococcal Polysaccharide-23 04/30/2010  . Td 06/30/2013    Preventative care: Last colonoscopy: Declined in past Prior vaccinations: DT - 2015 at an [facility]  Influenza: 03/2016 Pneumococcal: 04/2010 Prevnar13: today 02/05/2017 Shingles/Zostavax: Deferred til checks with Ins co for co-pay  Names of Other Physician/Practitioners you currently use: 1. [facility] here for primary care 2. Dr Katy Fitch , eye doctor, last visit 2015 3.No dentist, last visit -  Has Dentures x 2 years Patient Care Team: [name], MD as PCP - General (Internal  Medicine)  SURGICAL HISTORY He  has a past surgical history that includes Eye surgery; Fracture surgery; and Atrial fibrillation ablation.   FAMILY HISTORY His family history includes Cancer in his father and mother.   SOCIAL HISTORY He  reports that he has been smoking Cigarettes.  He has been smoking about 0.75 packs per day. He does not have any smokeless tobacco history on file. He reports that he drinks about 2.4 oz of alcohol per week .   MEDICARE WELLNESS OBJECTIVES: Physical activity:sedentary Cardiac risk factors:  Age>65, male, sedentary, smoker, labile HTN, Hx/o pAfib Depression/mood screen:   Depression screen [facility] 2/9 03/03/2016  Decreased Interest 0  Down, Depressed, Hopeless 0  PHQ - 2 Score 0  Altered sleeping -0  Tired, decreased energy -0  Change in appetite -0  Feeling bad or failure about yourself  -0  Trouble concentrating -0  Moving slowly or fidgety/restless -0  Suicidal thoughts -0  PHQ-9 Score -0  Difficult doing work/chores 0    ADLs:  In your present state of health, do you have any difficulty performing the following activities: 03/03/2016  Hearing? N  Vision? N  Difficulty concentrating or making decisions? N  Walking  or climbing stairs? N  Dressing or bathing? N  Doing errands, shopping? N  Some recent data might be hidden     Cognitive Testing  Alert? Yes  Normal Appearance?Yes  Oriented to person? Yes  Place? Yes   Time? Yes  Recall of three objects?  Yes  Can perform simple calculations? Yes  Displays appropriate judgment?Yes  Can read the correct time from a watch face?Yes  EOL planning: yes  ROS In addition to the HPI above,  No Fever-chills,  No Headache, No changes with Vision or hearing,  No problems swallowing food or Liquids,  No Chest pain or productive Cough or Shortness of Breath,  No Abdominal pain, No Nausea or Vomitting, Bowel movements are regular,  No Blood in stool or Urine,  No dysuria,  No new skin rashes or  bruises,  No new joints pains-aches,  No new weakness, tingling, numbness in any extremity,  No recent weight loss,  No polyuria, polydypsia or polyphagia,  No significant Mental Stressors.  A full 10 point Review of Systems was done, except as stated above, all other Review of Systems were negative  Objective:     Today's Vitals   02/05/17 1221  BP: (!) 110/58  Pulse: 72  Resp: 16  Temp: (!) 97.3 F (36.3 C)  Weight: 198 lb (89.8 kg)  Height: 6\' 2"  (1.88 m)   Body mass index is 25.42 kg/m.  General appearance: alert, no distress, WD/WN, male HEENT: normocephalic, sclerae anicteric, TMs pearly, nares patent, no discharge or erythema, pharynx normal Oral cavity: MMM, no lesions. Dentures. Neck: supple, no lymphadenopathy, no thyromegaly, no masses Heart: RRR, normal S1, S2, no murmurs Lungs: CTA bilaterally, no wheezes, rhonchi, or rales Abdomen: +bs, soft, non tender, non distended, no masses, no hepatomegaly, no splenomegaly Musculoskeletal: nontender, no swelling, no obvious deformity Extremities: no edema, no cyanosis, no clubbing Pulses: 2+ symmetric, upper and lower extremities, normal cap refill Neurological: alert, oriented x 3, CN2-12 intact, strength normal upper extremities and lower extremities, sensation normal throughout, DTRs 2+ throughout, no cerebellar signs, gait normal Psychiatric: normal affect, behavior normal, pleasant   Medicare Attestation I have personally reviewed: The patient's medical and social history Their use of alcohol, tobacco or illicit drugs Their current medications and supplements The patient's functional ability including ADLs,fall risks, home safety risks, cognitive, and hearing and visual impairment Diet and physical activities Evidence for depression or mood disorders  The patient's weight, height, BMI, and visual acuity have been recorded in the chart.  I have made referrals, counseling, and provided education to the patient based  on review of the above and I have provided the patient with a written personalized care plan for preventive services.     Cleve Paolillo Khamani, MD   02/05/2017

## 2017-02-05 NOTE — Patient Instructions (Signed)

## 2017-02-06 LAB — URINALYSIS, ROUTINE W REFLEX MICROSCOPIC
Bilirubin Urine: NEGATIVE
GLUCOSE, UA: NEGATIVE
Hgb urine dipstick: NEGATIVE
Ketones, ur: NEGATIVE
Leukocytes, UA: NEGATIVE
Nitrite: NEGATIVE
PH: 6.5 (ref 5.0–8.0)
Protein, ur: NEGATIVE
SPECIFIC GRAVITY, URINE: 1.007 (ref 1.001–1.035)

## 2017-02-06 LAB — HEMOGLOBIN A1C
Hgb A1c MFr Bld: 5.7 % — ABNORMAL HIGH (ref <5.7)
Mean Plasma Glucose: 117 mg/dL

## 2017-02-06 LAB — PSA: PSA: 0.5 ng/mL (ref <=4.0)

## 2017-02-06 LAB — MICROALBUMIN / CREATININE URINE RATIO
Creatinine, Urine: 53 mg/dL (ref 20–370)
Microalb Creat Ratio: 6 mcg/mg creat (ref <30)
Microalb, Ur: 0.3 mg/dL

## 2017-02-06 LAB — MAGNESIUM: MAGNESIUM: 2 mg/dL (ref 1.5–2.5)

## 2017-02-06 LAB — INSULIN, RANDOM: INSULIN: 9.9 u[IU]/mL (ref 2.0–19.6)

## 2017-02-06 LAB — VITAMIN D 25 HYDROXY (VIT D DEFICIENCY, FRACTURES): VIT D 25 HYDROXY: 23 ng/mL — AB (ref 30–100)

## 2017-02-10 LAB — VARICELLA ZOSTER ANTIBODY, IGG: Varicella IgG: 1432 Index — ABNORMAL HIGH (ref <135.00)

## 2017-02-26 ENCOUNTER — Ambulatory Visit (INDEPENDENT_AMBULATORY_CARE_PROVIDER_SITE_OTHER): Payer: Medicare Other | Admitting: Internal Medicine

## 2017-02-26 VITALS — BP 110/62 | HR 84 | Temp 97.5°F | Resp 16 | Ht 74.0 in | Wt 191.0 lb

## 2017-02-26 DIAGNOSIS — D485 Neoplasm of uncertain behavior of skin: Secondary | ICD-10-CM

## 2017-02-26 DIAGNOSIS — I10 Essential (primary) hypertension: Secondary | ICD-10-CM

## 2017-02-26 DIAGNOSIS — L820 Inflamed seborrheic keratosis: Secondary | ICD-10-CM

## 2017-02-26 DIAGNOSIS — B079 Viral wart, unspecified: Secondary | ICD-10-CM | POA: Diagnosis not present

## 2017-02-26 NOTE — Progress Notes (Signed)
[facility]   [name], M.D.       Uvaldo Bristle. Silverio Lay, P.A.-C  [facility]                [address] [place], [place] [address] Telephone [phone] Telefax [phone]  Subjective:    Patient ID: Bradley Ferguson, male    DOB: [birth year], 66 y.o.   MRN: [id number]  HPI  This very nice 66 yo [name] with hx/o labile HTN presents for recheck and BP is normal. HT & CV systems review negative.    He also has 2 skin lesions - one on the vertex scalp and the 2sd on the Rt posterior scalp.  Medication Sig  . citalopram  20 MG tablet Take 1 tablet daily   Allergies  Allergen Reactions  . Effexor [Venlafaxine]     Feels bad  . Wellbutrin [Bupropion]     sleepy  . Zoloft [Sertraline Hcl]     Felt bad   Past Medical History:  Diagnosis Date  . Anxiety   . Blood transfusion without reported diagnosis   . Cataract   . Depression   . Emphysema of lung ([place])   . Heart murmur    Review of Systems  10 point systems review negative except as above.    Objective:   Physical Exam  BP 110/62   Pulse 84   Temp (!) 97.5 F (36.4 C)   Resp 16   Ht 6\' 2"  (1.88 m)   Wt 191 lb (86.6 kg)   BMI 24.52 kg/m   HEENT - WNL. Neck - supple.  Chest - Clear equal BS. Cor - Nl HS. RRR w/o sig MGR. PP 1(+). No edema. MS- FROM w/o deformities.  Gait Nl. Neuro -  Nl w/o focal abnormalities. Skin -  there is a 8 mm x 8 mm firm pink raised sl irregular lesion of the vertex scalp.  There is a 2sd lesion 12 mm x 12 mm sl raised medium/ dark brow with surrounding erythema and appearing excoriated.   Procedure(s) -   (1) (CPT - 11305)  After informed consent and aseptic prep with alcohol, the 1st lesion was locally anesthetized with 1 ml of Marcaine 0.5%. Then, with a #10 scalpel, the vertex lesion was excised by shave excisional bx technique and the wound base was lightly hyfrecated for hemostasis. Lesion sent  for Path analysis.  (2) (CPT 11700) The 2sd lesion was treat ed with liq Nitrogen by triple freeze/thaw technique.     Assessment & Plan:   1. Essential hypertension  2. Neoplasm of uncertain behavior of skin scalp vertex  - Dermatology pathology  3. Seborrheic keratoses, inflamed - posterior scalp

## 2017-03-20 ENCOUNTER — Encounter: Payer: Self-pay | Admitting: Internal Medicine

## 2017-03-20 ENCOUNTER — Ambulatory Visit (INDEPENDENT_AMBULATORY_CARE_PROVIDER_SITE_OTHER): Payer: Medicare Other | Admitting: Internal Medicine

## 2017-03-20 VITALS — BP 116/82 | HR 72 | Temp 98.1°F | Resp 18 | Ht 74.0 in | Wt 195.4 lb

## 2017-03-20 DIAGNOSIS — E782 Mixed hyperlipidemia: Secondary | ICD-10-CM

## 2017-03-20 DIAGNOSIS — I480 Paroxysmal atrial fibrillation: Secondary | ICD-10-CM

## 2017-03-20 DIAGNOSIS — I739 Peripheral vascular disease, unspecified: Secondary | ICD-10-CM | POA: Diagnosis not present

## 2017-03-20 DIAGNOSIS — R0989 Other specified symptoms and signs involving the circulatory and respiratory systems: Secondary | ICD-10-CM

## 2017-03-20 DIAGNOSIS — Z23 Encounter for immunization: Secondary | ICD-10-CM | POA: Diagnosis not present

## 2017-03-20 DIAGNOSIS — Z1211 Encounter for screening for malignant neoplasm of colon: Secondary | ICD-10-CM | POA: Diagnosis not present

## 2017-03-20 NOTE — Progress Notes (Signed)
   Subjective:    Patient ID: Bradley Ferguson, male    DOB: Aug 16, 1950, 66 y.o.   MRN: [id number]  HPI  Patient is a very nice 57 vo [name] with  labile HTN who presents for f/u after starting low dose Celexa 20 mg and he has a bout of diarrhea which he attributed to the Celexa and stopped it.       Patient has hx/o pAfib in 2011 failing CV and ultimately has a RF Ablation. He denies and c/o HA's , dizziness , exertional CP, palpitations, DOE/PND but does relate hx  of thigh & calf discomfort suspect for claudication walking up stairs relieved w/rest.     Review of Systems  10 point systems review negative except as above.    Objective:   Physical Exam  BP 116/82   Pulse 72   Temp 98.1 F (36.7 C)   Resp 18   Ht 6\' 2"  (1.88 m)   Wt 195 lb 6.4 oz (88.6 kg)   BMI 25.09 kg/m   HEENT - WNL. Neck - supple.  Chest - Clear equal BS. Cor - Nl HS. RRR w/o sig MGR. Bilat femoral & PP not palpable. No edema. MS- FROM w/o deformities.  Gait Nl. Neuro -  Nl w/o focal abnormalities.    Assessment & Plan:   1. Labile hypertension  2. Claudication ([place])  recc take bASA qd  - [place] Lower Ext Art Bilat; Future - Ambulatory referral to Cardiology  3. Paroxysmal atrial fibrillation, s/p RFA   (HCC)   Hyperlipidemia, mixed  5. Need for immunization against influenza  - Flu vaccine HIGH DOSE PF (Fluzone High dose)   6. Chronic Anxiety   - recommended retry Celexa 20 mg at 1/2 tab = 10 mg daily   - ROV 6 weeks

## 2017-03-20 NOTE — Patient Instructions (Signed)
Peripheral Vascular Disease Peripheral vascular disease (PVD) is a disease of the blood vessels that are not part of your heart and brain. A simple term for PVD is poor circulation. In most cases, PVD narrows the blood vessels that carry blood from your heart to the rest of your body. This can result in a decreased supply of blood to your arms, legs, and internal organs, like your stomach or kidneys. However, it most often affects a person's lower legs and feet. There are two types of PVD.  Organic PVD. This is the more common type. It is caused by damage to the structure of blood vessels.  Functional PVD. This is caused by conditions that make blood vessels contract and tighten (spasm).  Without treatment, PVD tends to get worse over time. PVD can also lead to acute ischemic limb. This is when an arm or limb suddenly has trouble getting enough blood. This is a medical emergency. What are the causes? Each type of PVD has many different causes. The most common cause of PVD is buildup of a fatty material (plaque) inside of your arteries (atherosclerosis). Small amounts of plaque can break off from the walls of the blood vessels and become lodged in a smaller artery. This blocks blood flow and can cause acute ischemic limb. Other common causes of PVD include:  Blood clots that form inside of blood vessels.  Injuries to blood vessels.  Diseases that cause inflammation of blood vessels or cause blood vessel spasms.  Health behaviors and health history that increase your risk of developing PVD.  What increases the risk? You may have a greater risk of PVD if you:  Have a family history of PVD.  Have certain medical conditions, including: ? High cholesterol. ? Diabetes. ? High blood pressure (hypertension). ? Coronary heart disease. ? Past problems with blood clots. ? Past injury, such as burns or a broken bone. These may have damaged blood vessels in your limbs. ? Buerger disease. This is  caused by inflamed blood vessels in your hands and feet. ? Some forms of arthritis. ? Rare birth defects that affect the arteries in your legs.  Use tobacco.  Do not get enough exercise.  Are obese.  Are age 50 or older.  What are the signs or symptoms? PVD may cause many different symptoms. Your symptoms depend on what part of your body is not getting enough blood. Some common signs and symptoms include:  Cramps in your lower legs. This may be a symptom of poor leg circulation (claudication).  Pain and weakness in your legs while you are physically active that goes away when you rest (intermittent claudication).  Leg pain when at rest.  Leg numbness, tingling, or weakness.  Coldness in a leg or foot, especially when compared with the other leg.  Skin or hair changes. These can include: ? Hair loss. ? Shiny skin. ? Pale or bluish skin. ? Thick toenails.  Inability to get or maintain an erection (erectile dysfunction).  People with PVD are more prone to developing ulcers and sores on their toes, feet, or legs. These may take longer than normal to heal. How is this diagnosed? Your health care provider may diagnose PVD from your signs and symptoms. The health care provider will also do a physical exam. You may have tests to find out what is causing your PVD and determine its severity. Tests may include:  Blood pressure recordings from your arms and legs and measurements of the strength of your pulses (  pulse volume recordings).  Imaging studies using sound waves to take pictures of the blood flow through your blood vessels (Doppler ultrasound).  Injecting a dye into your blood vessels before having imaging studies using: ? X-rays (angiogram or arteriogram). ? Computer-generated X-rays (CT angiogram). ? A powerful electromagnetic field and a computer (magnetic resonance angiogram or MRA).  How is this treated? Treatment for PVD depends on the cause of your condition and the  severity of your symptoms. It also depends on your age. Underlying causes need to be treated and controlled. These include long-lasting (chronic) conditions, such as diabetes, high cholesterol, and high blood pressure. You may need to first try making lifestyle changes and taking medicines. Surgery may be needed if these do not work. Lifestyle changes may include:  Quitting smoking.  Exercising regularly.  Following a low-fat, low-cholesterol diet.  Medicines may include:  Blood thinners to prevent blood clots.  Medicines to improve blood flow.  Medicines to improve your blood cholesterol levels.  Surgical procedures may include:  A procedure that uses an inflated balloon to open a blocked artery and improve blood flow (angioplasty).  A procedure to put in a tube (stent) to keep a blocked artery open (stent implant).  Surgery to reroute blood flow around a blocked artery (peripheral bypass surgery).  Surgery to remove dead tissue from an infected wound on the affected limb.  Amputation. This is surgical removal of the affected limb. This may be necessary in cases of acute ischemic limb that are not improved through medical or surgical treatments.  Follow these instructions at home:  Take medicines only as directed by your health care provider.  Do not use any tobacco products, including cigarettes, chewing tobacco, or electronic cigarettes. If you need help quitting, ask your health care provider.  Lose weight if you are overweight, and maintain a healthy weight as directed by your health care provider.  Eat a diet that is low in fat and cholesterol. If you need help, ask your health care provider.  Exercise regularly. Ask your health care provider to suggest some good activities for you.  Use compression stockings or other mechanical devices as directed by your health care provider.  Take good care of your feet. ? Wear comfortable shoes that fit well. ? Check your feet  often for any cuts or sores. Contact a health care provider if:  You have cramps in your legs while walking.  You have leg pain when you are at rest.  You have coldness in a leg or foot.  Your skin changes.  You have erectile dysfunction.  You have cuts or sores on your feet that are not healing. Get help right away if:  Your arm or leg turns cold and blue.  Your arms or legs become red, warm, swollen, painful, or numb.  You have chest pain or trouble breathing.  You suddenly have weakness in your face, arm, or leg.  You become very confused or lose the ability to speak.  You suddenly have a very bad headache or lose your vision. This information is not intended to replace advice given to you by your health care provider. Make sure you discuss any questions you have with your health care provider. Document Released: 07/24/2004 Document Revised: 11/22/2015 Document Reviewed: 11/24/2013 Elsevier Interactive Patient Education  2017 [employer].  

## 2017-03-23 ENCOUNTER — Other Ambulatory Visit: Payer: Self-pay | Admitting: *Deleted

## 2017-03-23 ENCOUNTER — Encounter: Payer: Self-pay | Admitting: Internal Medicine

## 2017-03-23 DIAGNOSIS — Z1211 Encounter for screening for malignant neoplasm of colon: Secondary | ICD-10-CM

## 2017-03-23 DIAGNOSIS — Z1212 Encounter for screening for malignant neoplasm of rectum: Principal | ICD-10-CM

## 2017-03-23 LAB — POC HEMOCCULT BLD/STL (HOME/3-CARD/SCREEN)
Card #2 Fecal Occult Blod, POC: NEGATIVE
Card #3 Fecal Occult Blood, POC: NEGATIVE
Fecal Occult Blood, POC: NEGATIVE

## 2017-03-24 ENCOUNTER — Other Ambulatory Visit: Payer: Self-pay | Admitting: Internal Medicine

## 2017-03-24 DIAGNOSIS — I739 Peripheral vascular disease, unspecified: Secondary | ICD-10-CM

## 2017-04-08 ENCOUNTER — Encounter: Payer: Self-pay | Admitting: Internal Medicine

## 2017-04-09 ENCOUNTER — Ambulatory Visit (HOSPITAL_COMMUNITY)
Admission: RE | Admit: 2017-04-09 | Discharge: 2017-04-09 | Disposition: A | Discharge status: Home / Self Care | Payer: Medicare Other | Source: Ambulatory Visit | Attending: Cardiovascular Disease | Admitting: Cardiovascular Disease

## 2017-04-09 DIAGNOSIS — I70203 Unspecified atherosclerosis of native arteries of extremities, bilateral legs: Secondary | ICD-10-CM | POA: Insufficient documentation

## 2017-04-09 DIAGNOSIS — I739 Peripheral vascular disease, unspecified: Secondary | ICD-10-CM | POA: Diagnosis not present

## 2017-04-13 ENCOUNTER — Encounter: Payer: Self-pay | Admitting: Internal Medicine

## 2017-04-14 ENCOUNTER — Telehealth: Payer: Self-pay | Admitting: Cardiovascular Disease

## 2017-04-14 NOTE — Telephone Encounter (Signed)
Attempted to reach pt, left message to call our office. Will attempt to call again.

## 2017-04-14 NOTE — Telephone Encounter (Signed)
Attempted to reach pt again to schedule appointment. Left message to call back as soon as possible. Will attempt to call again.

## 2017-04-14 NOTE — Telephone Encounter (Signed)
-----   Message from Lorretta Harp, MD sent at 04/13/2017  7:24 AM EDT ----- Lovena Le, it sounds like Dr Melford Aase wants me to see this pt in the office to eval for PAD. Can you call his office today and confirm and if so make the office appointment for me to see?/  Thx, JJB ----- Message ----- From: [name], MD Sent: 04/12/2017   2:29 PM To: Lorretta Harp, MD, Doran Stabler, MD    ----- Message ----- From: Interface, Rad Results In Sent: 04/11/2017  12:14 PM To: [name], MD

## 2017-04-15 NOTE — Telephone Encounter (Signed)
Attempted to reach pt again to schedule appointment. Left message to call back as soon as possible. Will attempt to call again.

## 2017-04-16 ENCOUNTER — Encounter: Payer: Self-pay | Admitting: Cardiovascular Disease

## 2017-04-16 ENCOUNTER — Ambulatory Visit (INDEPENDENT_AMBULATORY_CARE_PROVIDER_SITE_OTHER): Payer: Medicare Other | Admitting: Cardiovascular Disease

## 2017-04-16 ENCOUNTER — Telehealth: Payer: Self-pay | Admitting: Internal Medicine

## 2017-04-16 VITALS — BP 118/65 | HR 91 | Ht 73.5 in | Wt 197.0 lb

## 2017-04-16 DIAGNOSIS — I739 Peripheral vascular disease, unspecified: Secondary | ICD-10-CM

## 2017-04-16 DIAGNOSIS — Z01810 Encounter for preprocedural cardiovascular examination: Secondary | ICD-10-CM | POA: Diagnosis not present

## 2017-04-16 NOTE — Progress Notes (Signed)
04/16/2017 Bradley Ferguson   04-12-1951  [id number]  Primary Physician [name], MD Primary Cardiologist: Lorretta Harp MD [name], [place]  HPI:  Bradley Ferguson is a 66 y.o. male separated, father of 63, grandfather one grandchild who works as a Systems developer. He was referred by Dr. Melford Aase for peripheral vascular evaluation because of life limiting claudication. His risk factors include treated hyperlipidemia and 45 pack years of tobacco abuse continued to smoke one pack per day. Never had a heart attack or stroke. He does complain of some dyspnea on exertion probably related to COPD. He's had A. fib ablation back in 2011 by Dr. Caryl Comes but has not followed up since. He has complained of less limiting claudication over the last year which is symmetric and bilateral. He can walk one block after which she has to stop because of discomfort. He has had lower extremity arterial Doppler studies in our office 04/11/17 revealing a high-frequency signal in his right common iliac artery and a total left SFA.   Current Meds  Medication Sig  . ALPRAZolam (XANAX) 1 MG tablet Take 0.5 mg by mouth at bedtime as needed for anxiety.      Allergies  Allergen Reactions  . Effexor [Venlafaxine]     Feels bad  . Wellbutrin [Bupropion]     sleepy  . Zoloft [Sertraline Hcl]     Felt bad    Social History   Social History  . Marital status: Legally Separated    Spouse name: N/A  . Number of children: N/A  . Years of education: N/A   Occupational History  . Not on file.   Social History Main Topics  . Smoking status: Current Every Day Smoker    Packs/day: 0.75    Types: Cigarettes  . Smokeless tobacco: Never Used  . Alcohol use 2.4 oz/week    4 Standard drinks or equivalent per week     Comment: 3-4 drinks per week  . Drug use: Unknown  . Sexual activity: Not on file   Other Topics Concern  . Not on file   Social History Narrative  . No narrative on file     Review of  Systems: General: negative for chills, fever, night sweats or weight changes.  Cardiovascular: negative for chest pain, dyspnea on exertion, edema, orthopnea, palpitations, paroxysmal nocturnal dyspnea or shortness of breath Dermatological: negative for rash Respiratory: negative for cough or wheezing Urologic: negative for hematuria Abdominal: negative for nausea, vomiting, diarrhea, bright red blood per rectum, melena, or hematemesis Neurologic: negative for visual changes, syncope, or dizziness All other systems reviewed and are otherwise negative except as noted above.    Blood pressure 118/65, pulse 91, height 6' 1.5" (1.867 m), weight 197 lb (89.4 kg).  General appearance: alert and no distress Neck: no adenopathy, no carotid bruit, no JVD, supple, symmetrical, trachea midline and thyroid not enlarged, symmetric, no tenderness/mass/nodules Lungs: clear to auscultation bilaterally Heart: regular rate and rhythm, S1, S2 normal, no murmur, click, rub or gallop Extremities: extremities normal, atraumatic, no cyanosis or edema Pulses: 1+ right common femoral, 2+ left common femoral with a soft bruit, diminished pedal pulses. Skin: Skin color, texture, turgor normal. No rashes or lesions Neurologic: Alert and oriented X 3, normal strength and tone. Normal symmetric reflexes. Normal coordination and gait  EKG not performed today.  ASSESSMENT AND PLAN:   Claudication in peripheral vascular disease [facility]) Mr. Counterman was referred to me by Dr. Melford Aase for  evaluation treatment of lifestyle limiting claudication. His risk factors include ongoing tobacco abuse and hyperlipidemia. He's had claudication for the last year which is bilateral symmetric and lifestyle limiting at less than one block. He had recent Doppler studies reveal a high grade right common iliac artery stenosis and a total left SFA. He wishes to proceed with angiography and potential endovascular therapy.      Lorretta Harp MD FACP,FACC,FAHA, [facility] 04/16/2017 4:10 PM

## 2017-04-16 NOTE — Assessment & Plan Note (Signed)
Bradley Ferguson was referred to me by Dr. Melford Aase for evaluation treatment of lifestyle limiting claudication. His risk factors include ongoing tobacco abuse and hyperlipidemia. He's had claudication for the last year which is bilateral symmetric and lifestyle limiting at less than one block. He had recent Doppler studies reveal a high grade right common iliac artery stenosis and a total left SFA. He wishes to proceed with angiography and potential endovascular therapy.

## 2017-04-16 NOTE — Patient Instructions (Addendum)
   [place] [address] [place] [address] Dept: [id number] Loc: [phone]  Bradley Ferguson  04/16/2017  You are scheduled for a Peripheral Angiogram on Monday, October 29 with Dr. Quay Burow.  1. Please arrive at the [facility] (Main Entrance A) at [facility]: [address] [place], [place] [address] at 5:30 AM (two hours before your procedure to ensure your preparation). Free valet parking service is available.   Special note: Every effort is made to have your procedure done on time. Please understand that emergencies sometimes delay scheduled procedures.  2. Diet: Do not eat or drink anything after midnight prior to your procedure except sips of water to take medications.  3. Labs: Please have blood work in our office today.  4. Medication instructions in preparation for your procedure:  On the morning of your procedure, take any morning medicines NOT listed above.  You may use sips of water.  5. Plan for one night stay--bring personal belongings. 6. Bring a current list of your medications and current insurance cards. 7. You MUST have a responsible person to drive you home. 8. Someone MUST be with you the first 24 hours after you arrive home or your discharge will be delayed. 9. Please wear clothes that are easy to get on and off and wear slip-on shoes.  Thank you for allowing [place] to care for you!   -- [facility]   Post-procedure testing:  Your physician has requested that you have a lower extremity arterial duplex. During this test, ultrasound is used to evaluate arterial blood flow in the legs. Allow one hour for this exam. There are no restrictions or special instructions.  Your physician has requested that you have an ankle brachial index (ABI). During this test an ultrasound and blood pressure cuff are used to evaluate the  arteries that supply the arms and legs with blood. Allow thirty minutes for this exam. There are no restrictions or special instructions.  (1 week after procedure)  Follow-up: Your physician recommends that you schedule a follow-up appointment in: 2 weeks after procedure with Dr. [name].

## 2017-04-16 NOTE — Telephone Encounter (Signed)
Caren Griffins, rn spoke with patient and explained the serious nature of patient's follow up with Dr [name]. He requested a text  Dr Kennon Holter office number to call and schedule. texted information.

## 2017-04-16 NOTE — Addendum Note (Signed)
Addended by: Therisa Doyne on: 04/16/2017 04:14 PM   Modules accepted: Orders

## 2017-04-17 LAB — CBC WITH DIFFERENTIAL/PLATELET
BASOS: 1 %
Basophils Absolute: 0.1 10*3/uL (ref 0.0–0.2)
EOS (ABSOLUTE): 0.3 10*3/uL (ref 0.0–0.4)
EOS: 4 %
HEMATOCRIT: 41.6 % (ref 37.5–51.0)
HEMOGLOBIN: 13.9 g/dL (ref 13.0–17.7)
Immature Grans (Abs): 0 10*3/uL (ref 0.0–0.1)
Immature Granulocytes: 1 %
LYMPHS ABS: 2.1 10*3/uL (ref 0.7–3.1)
Lymphs: 30 %
MCH: 30.5 pg (ref 26.6–33.0)
MCHC: 33.4 g/dL (ref 31.5–35.7)
MCV: 91 fL (ref 79–97)
MONOCYTES: 7 %
Monocytes Absolute: 0.5 10*3/uL (ref 0.1–0.9)
NEUTROS ABS: 4 10*3/uL (ref 1.4–7.0)
Neutrophils: 57 %
PLATELETS: 149 10*3/uL — AB (ref 150–379)
RBC: 4.56 x10E6/uL (ref 4.14–5.80)
RDW: 14.9 % (ref 12.3–15.4)
WBC: 6.9 10*3/uL (ref 3.4–10.8)

## 2017-04-17 LAB — PROTIME-INR
INR: 1 (ref 0.8–1.2)
PROTHROMBIN TIME: 10.6 s (ref 9.1–12.0)

## 2017-04-17 LAB — BASIC METABOLIC PANEL
BUN / CREAT RATIO: 13 (ref 10–24)
BUN: 12 mg/dL (ref 8–27)
CHLORIDE: 101 mmol/L (ref 96–106)
CO2: 24 mmol/L (ref 20–29)
CREATININE: 0.95 mg/dL (ref 0.76–1.27)
Calcium: 9.2 mg/dL (ref 8.6–10.2)
GFR calc non Af Amer: 84 mL/min/{1.73_m2} (ref >59)
GFR, EST AFRICAN AMERICAN: 97 mL/min/{1.73_m2} (ref >59)
Glucose: 94 mg/dL (ref 65–99)
Potassium: 5.1 mmol/L (ref 3.5–5.2)
Sodium: 140 mmol/L (ref 134–144)

## 2017-04-17 LAB — APTT: aPTT: 30 s (ref 24–33)

## 2017-04-24 ENCOUNTER — Other Ambulatory Visit: Payer: Self-pay | Admitting: Cardiovascular Disease

## 2017-04-24 DIAGNOSIS — I739 Peripheral vascular disease, unspecified: Secondary | ICD-10-CM

## 2017-04-27 ENCOUNTER — Encounter (HOSPITAL_COMMUNITY)
Admission: RE | Disposition: A | Discharge status: Home / Self Care | Payer: Self-pay | Source: Ambulatory Visit | Attending: Cardiovascular Disease

## 2017-04-27 ENCOUNTER — Telehealth: Payer: Self-pay | Admitting: Cardiovascular Disease

## 2017-04-27 ENCOUNTER — Encounter (HOSPITAL_COMMUNITY): Payer: Self-pay | Admitting: Cardiovascular Disease

## 2017-04-27 ENCOUNTER — Ambulatory Visit (HOSPITAL_COMMUNITY)
Admission: RE | Admit: 2017-04-27 | Discharge: 2017-04-28 | Disposition: A | Discharge status: Home / Self Care | Payer: Medicare Other | Source: Ambulatory Visit | Attending: Cardiovascular Disease | Admitting: Cardiovascular Disease

## 2017-04-27 DIAGNOSIS — I739 Peripheral vascular disease, unspecified: Secondary | ICD-10-CM | POA: Diagnosis present

## 2017-04-27 DIAGNOSIS — F17210 Nicotine dependence, cigarettes, uncomplicated: Secondary | ICD-10-CM | POA: Insufficient documentation

## 2017-04-27 DIAGNOSIS — E785 Hyperlipidemia, unspecified: Secondary | ICD-10-CM | POA: Insufficient documentation

## 2017-04-27 DIAGNOSIS — I70211 Atherosclerosis of native arteries of extremities with intermittent claudication, right leg: Secondary | ICD-10-CM | POA: Insufficient documentation

## 2017-04-27 DIAGNOSIS — I700 Atherosclerosis of aorta: Secondary | ICD-10-CM | POA: Diagnosis not present

## 2017-04-27 HISTORY — PX: PERIPHERAL VASCULAR ATHERECTOMY: CATH118256

## 2017-04-27 HISTORY — PX: LOWER EXTREMITY INTERVENTION: CATH118252

## 2017-04-27 HISTORY — PX: PERIPHERAL VASCULAR INTERVENTION: CATH118257

## 2017-04-27 LAB — POCT ACTIVATED CLOTTING TIME
ACTIVATED CLOTTING TIME: 230 s
ACTIVATED CLOTTING TIME: 169 s
Activated Clotting Time: 274 seconds
Activated Clotting Time: 191 seconds

## 2017-04-27 SURGERY — LOWER EXTREMITY INTERVENTION
Anesthesia: LOCAL | Laterality: Right

## 2017-04-27 MED ORDER — NITROGLYCERIN IN D5W 200-5 MCG/ML-% IV SOLN
INTRAVENOUS | Status: AC
  Filled 2017-04-27: qty 250

## 2017-04-27 MED ORDER — CLOPIDOGREL BISULFATE 300 MG PO TABS
ORAL_TABLET | ORAL | Status: AC
  Filled 2017-04-27: qty 1

## 2017-04-27 MED ORDER — ONDANSETRON HCL 4 MG/2ML IJ SOLN: 4.0000 mg | Freq: Four times a day (QID) | INTRAMUSCULAR | Status: DC | PRN

## 2017-04-27 MED ORDER — CLOPIDOGREL BISULFATE 300 MG PO TABS
ORAL_TABLET | ORAL | Status: DC | PRN
  Administered 2017-04-27: 300 mg via ORAL

## 2017-04-27 MED ORDER — MORPHINE SULFATE (PF) 4 MG/ML IV SOLN: 2.0000 mg | INTRAVENOUS | Status: DC | PRN

## 2017-04-27 MED ORDER — HYDRALAZINE HCL 20 MG/ML IJ SOLN: 5.0000 mg | INTRAMUSCULAR | Status: DC | PRN

## 2017-04-27 MED ORDER — CLOPIDOGREL BISULFATE 75 MG PO TABS
75.0000 mg | ORAL_TABLET | Freq: Every day | ORAL | Status: DC
  Administered 2017-04-28: 75 mg via ORAL
  Filled 2017-04-27: qty 1

## 2017-04-27 MED ORDER — ASPIRIN EC 81 MG PO TBEC
81.0000 mg | DELAYED_RELEASE_TABLET | Freq: Every day | ORAL | Status: DC
  Administered 2017-04-28: 81 mg via ORAL
  Filled 2017-04-27: qty 1

## 2017-04-27 MED ORDER — SODIUM CHLORIDE 0.9 % IV SOLN: 250.0000 mL | INTRAVENOUS | Status: DC | PRN

## 2017-04-27 MED ORDER — NITROGLYCERIN 1 MG/10 ML FOR IR/CATH LAB
INTRA_ARTERIAL | Status: DC | PRN
  Administered 2017-04-27 (×2): 200 ug via INTRA_ARTERIAL

## 2017-04-27 MED ORDER — GUAIFENESIN-DM 100-10 MG/5ML PO SYRP
5.0000 mL | ORAL_SOLUTION | ORAL | Status: DC | PRN
  Administered 2017-04-27: 5 mL via ORAL
  Filled 2017-04-27: qty 5

## 2017-04-27 MED ORDER — ATORVASTATIN CALCIUM 80 MG PO TABS
80.0000 mg | ORAL_TABLET | Freq: Every day | ORAL | Status: DC
  Administered 2017-04-27: 20:00:00 80 mg via ORAL
  Filled 2017-04-27: qty 1

## 2017-04-27 MED ORDER — MORPHINE SULFATE (PF) 10 MG/ML IV SOLN: 2.0000 mg | INTRAVENOUS | Status: DC | PRN

## 2017-04-27 MED ORDER — VIPERSLIDE LUBRICANT OPTIME
TOPICAL | Status: DC | PRN
  Administered 2017-04-27: 08:00:00 via SURGICAL_CAVITY

## 2017-04-27 MED ORDER — SODIUM CHLORIDE 0.9 % WEIGHT BASED INFUSION
3.0000 mL/kg/h | INTRAVENOUS | Status: DC
  Administered 2017-04-27: 3 mL/kg/h via INTRAVENOUS

## 2017-04-27 MED ORDER — HEPARIN (PORCINE) IN NACL 2-0.9 UNIT/ML-% IJ SOLN
INTRAMUSCULAR | Status: AC
  Filled 2017-04-27: qty 1000

## 2017-04-27 MED ORDER — SODIUM CHLORIDE 0.9% FLUSH: 3.0000 mL | INTRAVENOUS | Status: DC | PRN

## 2017-04-27 MED ORDER — LIDOCAINE HCL 2 % IJ SOLN
INTRAMUSCULAR | Status: DC | PRN
  Administered 2017-04-27: 15 mL

## 2017-04-27 MED ORDER — ANGIOPLASTY BOOK
Freq: Once | Status: AC
  Administered 2017-04-27: 23:00:00 1
  Filled 2017-04-27: qty 1

## 2017-04-27 MED ORDER — HEPARIN SODIUM (PORCINE) 1000 UNIT/ML IJ SOLN
INTRAMUSCULAR | Status: DC | PRN
  Administered 2017-04-27: 8000 [IU] via INTRAVENOUS
  Administered 2017-04-27: 2500 [IU] via INTRAVENOUS

## 2017-04-27 MED ORDER — SODIUM CHLORIDE 0.9% FLUSH
3.0000 mL | Freq: Two times a day (BID) | INTRAVENOUS | Status: DC
  Administered 2017-04-27 (×2): 3 mL via INTRAVENOUS

## 2017-04-27 MED ORDER — HEPARIN SODIUM (PORCINE) 1000 UNIT/ML IJ SOLN
INTRAMUSCULAR | Status: AC
Start: 2017-04-27 — End: ?
  Filled 2017-04-27: qty 1

## 2017-04-27 MED ORDER — LIDOCAINE HCL 2 % IJ SOLN
INTRAMUSCULAR | Status: AC
Start: 2017-04-27 — End: ?
  Filled 2017-04-27: qty 20

## 2017-04-27 MED ORDER — MIDAZOLAM HCL 2 MG/2ML IJ SOLN
INTRAMUSCULAR | Status: AC
  Filled 2017-04-27: qty 2

## 2017-04-27 MED ORDER — ASPIRIN 81 MG PO CHEW
81.0000 mg | CHEWABLE_TABLET | ORAL | Status: AC
  Administered 2017-04-27: 81 mg via ORAL

## 2017-04-27 MED ORDER — LIDOCAINE HCL (PF) 1 % IJ SOLN: INTRAMUSCULAR | Status: DC | PRN

## 2017-04-27 MED ORDER — LABETALOL HCL 5 MG/ML IV SOLN: 10.0000 mg | INTRAVENOUS | Status: DC | PRN

## 2017-04-27 MED ORDER — HEPARIN (PORCINE) IN NACL 2-0.9 UNIT/ML-% IJ SOLN
INTRAMUSCULAR | Status: AC | PRN
  Administered 2017-04-27: 1000 mL via INTRA_ARTERIAL

## 2017-04-27 MED ORDER — IODIXANOL 320 MG/ML IV SOLN
INTRAVENOUS | Status: DC | PRN
  Administered 2017-04-27: 195 mL via INTRA_ARTERIAL

## 2017-04-27 MED ORDER — FENTANYL CITRATE (PF) 100 MCG/2ML IJ SOLN
INTRAMUSCULAR | Status: DC | PRN
  Administered 2017-04-27: 25 ug via INTRAVENOUS

## 2017-04-27 MED ORDER — MIDAZOLAM HCL 2 MG/2ML IJ SOLN
INTRAMUSCULAR | Status: DC | PRN
  Administered 2017-04-27: 1 mg via INTRAVENOUS

## 2017-04-27 MED ORDER — ASPIRIN 81 MG PO CHEW
CHEWABLE_TABLET | ORAL | Status: AC
  Filled 2017-04-27: qty 1

## 2017-04-27 MED ORDER — SODIUM CHLORIDE 0.9 % WEIGHT BASED INFUSION: 1.0000 mL/kg/h | INTRAVENOUS | Status: DC

## 2017-04-27 MED ORDER — VERAPAMIL HCL 2.5 MG/ML IV SOLN
INTRAVENOUS | Status: AC
  Filled 2017-04-27: qty 2

## 2017-04-27 MED ORDER — SODIUM CHLORIDE 0.9 % IV SOLN: INTRAVENOUS | Status: AC

## 2017-04-27 MED ORDER — ACETAMINOPHEN 325 MG PO TABS: 650.0000 mg | ORAL_TABLET | ORAL | Status: DC | PRN

## 2017-04-27 MED ORDER — FENTANYL CITRATE (PF) 100 MCG/2ML IJ SOLN
INTRAMUSCULAR | Status: AC
  Filled 2017-04-27: qty 2

## 2017-04-27 SURGICAL SUPPLY — 28 items
BALLN ARMADA 8X20X80 (BALLOONS) ×3
BALLN MUSTANG 5.0X40 75 (BALLOONS) ×3
BALLOON ARMADA 8X20X80 (BALLOONS) IMPLANT
BALLOON MUSTANG 5.0X40 75 (BALLOONS) IMPLANT
CATH ANGIO 5F BER2 65CM (CATHETERS) ×1 IMPLANT
CATH ANGIO 5F PIGTAIL 65CM (CATHETERS) ×1 IMPLANT
CATH CROSS OVER TEMPO 5F (CATHETERS) ×1 IMPLANT
CATH STRAIGHT 5FR 65CM (CATHETERS) ×2 IMPLANT
COVER PRB 48X5XTLSCP FOLD TPE (BAG) IMPLANT
COVER PROBE 5X48 (BAG) ×3
DEVICE CONTINUOUS FLUSH (MISCELLANEOUS) ×1 IMPLANT
DIAMONDBACK SOLID OAS 2.0MM (CATHETERS) ×3
GUIDEWIRE ANGLED .035X150CM (WIRE) ×1 IMPLANT
KIT ENCORE 26 ADVANTAGE (KITS) ×1 IMPLANT
KIT PV (KITS) ×3 IMPLANT
LUBRICANT VIPERSLIDE CORONARY (MISCELLANEOUS) ×1 IMPLANT
SHEATH BRITE TIP 7FR 35CM (SHEATH) ×1 IMPLANT
SHEATH PINNACLE 5F 10CM (SHEATH) ×1 IMPLANT
STENT VIABAHN 7X39X80 VBX (Permanent Stent) ×1 IMPLANT
STOPCOCK MORSE 400PSI 3WAY (MISCELLANEOUS) ×2 IMPLANT
SYRINGE MEDRAD AVANTA MACH 7 (SYRINGE) ×1 IMPLANT
SYSTEM DIMNDBCK SLD OAS 2.0MM (CATHETERS) IMPLANT
TAPE RADIOPAQUE TURBO (MISCELLANEOUS) ×1 IMPLANT
TRANSDUCER W/STOPCOCK (MISCELLANEOUS) ×3 IMPLANT
TRAY PV CATH (CUSTOM PROCEDURE TRAY) ×3 IMPLANT
TUBING CIL FLEX 10 FLL-RA (TUBING) ×1 IMPLANT
WIRE HITORQ VERSACORE ST 145CM (WIRE) ×1 IMPLANT
WIRE VIPER ADVANCE .017X335CM (WIRE) ×2 IMPLANT

## 2017-04-27 NOTE — Telephone Encounter (Signed)
Called patient and LVM to call back to schedule 2 week followup with Dr. [name] (week of 05-11-17).

## 2017-04-27 NOTE — H&P (View-Only) (Signed)
04/16/2017 Bradley Ferguson   [birth year]  [id number]  Primary Physician [name], MD Primary Cardiologist: Lorretta Harp MD [name], [place]  HPI:  Bradley Ferguson is a 66 y.o. 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 He was referred by Dr. Melford Aase for peripheral vascular evaluation because of life limiting claudication. His risk factors include treated hyperlipidemia and 45 pack years of tobacco abuse continued to smoke one pack per day. Never had a heart attack or stroke. He does complain of some dyspnea on exertion probably related to COPD. He's had A. fib ablation back in 2011 by Dr. Caryl Comes but has not followed up since. He has complained of less limiting claudication over the last year which is symmetric and bilateral. He can walk one block after which she has to stop because of discomfort. He has had lower extremity arterial Doppler studies in our office 04/11/17 revealing a high-frequency signal in his right common iliac artery and a total left SFA.   Current Meds  Medication Sig  . ALPRAZolam (XANAX) 1 MG tablet Take 0.5 mg by mouth at bedtime as needed for anxiety.      Allergies  Allergen Reactions  . Effexor [Venlafaxine]     Feels bad  . Wellbutrin [Bupropion]     sleepy  . Zoloft [Sertraline Hcl]     Felt bad    Social History   Social History  . Marital status: Legally Separated    Spouse name: N/A  . Number of children: N/A  . Years of education: N/A   Occupational History  . Not on file.   Social History Main Topics  . Smoking status: Current Every Day Smoker    Packs/day: 0.75    Types: Cigarettes  . Smokeless tobacco: Never Used  . Alcohol use 2.4 oz/week    4 Standard drinks or equivalent per week     Comment: 3-4 drinks per week  . Drug use: Unknown  . Sexual activity: Not on file   Other Topics Concern  . Not on file   Social History Narrative  . No narrative on file     Review of  Systems: General: negative for chills, fever, night sweats or weight changes.  Cardiovascular: negative for chest pain, dyspnea on exertion, edema, orthopnea, palpitations, paroxysmal nocturnal dyspnea or shortness of breath Dermatological: negative for rash Respiratory: negative for cough or wheezing Urologic: negative for hematuria Abdominal: negative for nausea, vomiting, diarrhea, bright red blood per rectum, melena, or hematemesis Neurologic: negative for visual changes, syncope, or dizziness All other systems reviewed and are otherwise negative except as noted above.    Blood pressure 118/65, pulse 91, height 6' 1.5" (1.867 m), weight 197 lb (89.4 kg).  General appearance: alert and no distress Neck: no adenopathy, no carotid bruit, no JVD, supple, symmetrical, trachea midline and thyroid not enlarged, symmetric, no tenderness/mass/nodules Lungs: clear to auscultation bilaterally Heart: regular rate and rhythm, S1, S2 normal, no murmur, click, rub or gallop Extremities: extremities normal, atraumatic, no cyanosis or edema Pulses: 1+ right common femoral, 2+ left common femoral with a soft bruit, diminished pedal pulses. Skin: Skin color, texture, turgor normal. No rashes or lesions Neurologic: Alert and oriented X 3, normal strength and tone. Normal symmetric reflexes. Normal coordination and gait  EKG not performed today.  ASSESSMENT AND PLAN:   Claudication in peripheral vascular disease [facility]) Mr. Guadamuz was referred to me by Dr. Melford Aase for  evaluation treatment of lifestyle limiting claudication. His risk factors include ongoing tobacco abuse and hyperlipidemia. He's had claudication for the last year which is bilateral symmetric and lifestyle limiting at less than one block. He had recent Doppler studies reveal a high grade right common iliac artery stenosis and a total left SFA. He wishes to proceed with angiography and potential endovascular therapy.      Lorretta Harp MD FACP,FACC,FAHA, [facility] 04/16/2017 4:10 PM

## 2017-04-27 NOTE — Progress Notes (Signed)
1815 bedrest completed, dressing dry and intact, level 0. Up to bathroom and then sitting up in lounge chair. 1830 site unchanged, dressing dry and intact and level 0 after activity.

## 2017-04-27 NOTE — Progress Notes (Signed)
Site area: right groin  Site Prior to Removal:  Level 0  Pressure Applied For 25 MINUTES    Minutes Beginning at 1145  Manual:   Yes.    Patient Status During Pull:  stable  Post Pull Groin Site:  Level 0  Post Pull Instructions Given:  Yes.    Post Pull Pulses Present:  Yes.    Dressing Applied:  Yes.

## 2017-04-27 NOTE — Interval H&P Note (Signed)
History and Physical Interval Note:  04/27/2017 7:27 AM  Bradley Ferguson  has presented today for surgery, with the diagnosis of claudication  The various methods of treatment have been discussed with the patient and family. After consideration of risks, benefits and other options for treatment, the patient has consented to  Procedure(s): LOWER EXTREMITY INTERVENTION (N/A) as a surgical intervention .  The patient's history has been reviewed, patient examined, no change in status, stable for surgery.  I have reviewed the patient's chart and labs.  Questions were answered to the patient's satisfaction.     Quay Burow

## 2017-04-28 ENCOUNTER — Other Ambulatory Visit: Payer: Self-pay | Admitting: Cardiovascular Disease

## 2017-04-28 ENCOUNTER — Encounter (HOSPITAL_COMMUNITY): Payer: Medicare Other

## 2017-04-28 DIAGNOSIS — I739 Peripheral vascular disease, unspecified: Secondary | ICD-10-CM | POA: Diagnosis not present

## 2017-04-28 DIAGNOSIS — I70211 Atherosclerosis of native arteries of extremities with intermittent claudication, right leg: Secondary | ICD-10-CM | POA: Diagnosis not present

## 2017-04-28 DIAGNOSIS — F17210 Nicotine dependence, cigarettes, uncomplicated: Secondary | ICD-10-CM | POA: Diagnosis not present

## 2017-04-28 DIAGNOSIS — E785 Hyperlipidemia, unspecified: Secondary | ICD-10-CM | POA: Diagnosis not present

## 2017-04-28 DIAGNOSIS — I700 Atherosclerosis of aorta: Secondary | ICD-10-CM | POA: Diagnosis not present

## 2017-04-28 LAB — BASIC METABOLIC PANEL
ANION GAP: 10 (ref 5–15)
BUN: 14 mg/dL (ref 6–20)
CHLORIDE: 106 mmol/L (ref 101–111)
CO2: 23 mmol/L (ref 22–32)
Calcium: 8.8 mg/dL — ABNORMAL LOW (ref 8.9–10.3)
Creatinine, Ser: 0.94 mg/dL (ref 0.61–1.24)
GFR calc non Af Amer: >60 mL/min (ref >60)
GLUCOSE: 98 mg/dL (ref 65–99)
POTASSIUM: 4 mmol/L (ref 3.5–5.1)
Sodium: 139 mmol/L (ref 135–145)

## 2017-04-28 LAB — CBC
HEMATOCRIT: 37.2 % — AB (ref 39.0–52.0)
HEMOGLOBIN: 12.2 g/dL — AB (ref 13.0–17.0)
MCH: 30 pg (ref 26.0–34.0)
MCHC: 32.8 g/dL (ref 30.0–36.0)
MCV: 91.6 fL (ref 78.0–100.0)
Platelets: 116 10*3/uL — ABNORMAL LOW (ref 150–400)
RBC: 4.06 MIL/uL — AB (ref 4.22–5.81)
RDW: 14.7 % (ref 11.5–15.5)
WBC: 7.1 10*3/uL (ref 4.0–10.5)

## 2017-04-28 MED ORDER — ATORVASTATIN CALCIUM 80 MG PO TABS: 80.0000 mg | ORAL_TABLET | Freq: Every day | ORAL | 0 refills | Status: DC

## 2017-04-28 MED ORDER — CLOPIDOGREL BISULFATE 75 MG PO TABS: 75.0000 mg | ORAL_TABLET | Freq: Every day | ORAL | 1 refills | Status: DC

## 2017-04-28 MED ORDER — ASPIRIN 81 MG PO TBEC: 81.0000 mg | DELAYED_RELEASE_TABLET | Freq: Every day | ORAL | Status: DC

## 2017-04-28 NOTE — Progress Notes (Signed)
Progress Note  Patient Name: Bradley Ferguson Date of Encounter: 04/28/2017  Primary Cardiologist: [name]  Subjective   No chest pain;   Inpatient Medications    Scheduled Meds: . aspirin EC  81 mg Oral Daily  . atorvastatin  80 mg Oral q1800  . clopidogrel  75 mg Oral Q breakfast  . sodium chloride flush  3 mL Intravenous Q12H   Continuous Infusions: . sodium chloride     PRN Meds: sodium chloride, acetaminophen, guaiFENesin-dextromethorphan, hydrALAZINE, labetalol, morphine injection, ondansetron (ZOFRAN) IV, sodium chloride flush   Vital Signs    Vitals:   04/28/17 0000 04/28/17 0120 04/28/17 0400 04/28/17 0700  BP: (!) 104/59 (!) 101/39 (!) 115/58 110/65  Pulse: 81 86 80 78  Resp: (!) 21 17 20 19   Temp: 98.7 F (37.1 C)  97.9 F (36.6 C) 98.5 F (36.9 C)  TempSrc: Oral  Oral Oral  SpO2: 98% 97%  98%  Weight:      Height:        Intake/Output Summary (Last 24 hours) at 04/28/17 0817 Last data filed at 04/28/17 0700  Gross per 24 hour  Intake          1671.25 ml  Output              900 ml  Net           771.25 ml    I/O since admission: +771  Filed Weights   04/27/17 0542  Weight: 197 lb (89.4 kg)    Telemetry    Sinus 89 - Personally Reviewed  ECG    ECG (independently read by me): not done today  Physical Exam   BP 110/65 (BP Location: Left Arm)   Pulse 78   Temp 98.5 F (36.9 C) (Oral)   Resp 19   Ht 6\' 1"  (1.854 m)   Wt 197 lb (89.4 kg)   SpO2 98%   BMI 25.99 kg/m  General: Alert, oriented, no distress.  Skin: normal turgor, no rashes, warm and dry HEENT: Normocephalic, atraumatic. Pupils equal round and reactive to light; sclera anicteric; extraocular muscles intact;  Nose without nasal septal hypertrophy Mouth/Parynx benign; Mallinpatti scale 3 Neck: No JVD, no carotid bruits; normal carotid upstroke Lungs: decreased BS with scattered rhochi; no wheezing or rales Chest wall: without tenderness to palpitation Heart: PMI not  displaced, RRR, s1 s2 normal, 1/6 systolic murmur, no diastolic murmur, no rubs, gallops, thrills, or heaves Abdomen: soft, nontender; no hepatosplenomehaly, BS+; abdominal aorta nontender and not dilated by palpation. Back: no CVA tenderness Pulses 2+: R groin cath site stable Musculoskeletal: full range of motion, normal strength, no joint deformities Extremities: no clubbing cyanosis or edema, Homan's sign negative  Neurologic: grossly nonfocal; Cranial nerves grossly wnl Psychologic: Normal mood and affect   Labs    Chemistry Recent Labs Lab 04/28/17 0510  NA 139  K 4.0  CL 106  CO2 23  GLUCOSE 98  BUN 14  CREATININE 0.94  CALCIUM 8.8*  GFRNONAA >60  GFRAA >60  ANIONGAP 10     Hematology Recent Labs Lab 04/28/17 0510  WBC 7.1  RBC 4.06*  HGB 12.2*  HCT 37.2*  MCV 91.6  MCH 30.0  MCHC 32.8  RDW 14.7  PLT PENDING    Cardiac EnzymesNo results for input(s): TROPONINI in the last 168 hours. No results for input(s): TROPIPOC in the last 168 hours.   BNPNo results for input(s): BNP, PROBNP in the last 168 hours.  DDimer No results for input(s): DDIMER in the last 168 hours.   Lipid Panel     Component Value Date/Time   CHOL 247 (H) 02/05/2017 1234   TRIG 177 (H) 02/05/2017 1234   HDL 40 (L) 02/05/2017 1234   CHOLHDL 6.2 (H) 02/05/2017 1234   VLDL 35 (H) 02/05/2017 1234   LDLCALC 172 (H) 02/05/2017 1234    Radiology    No results found.  Cardiac Studies   Angiographic Data:   1: Abdominal aorta-abdominal aorta was fluoroscopically calcified and mild to moderately atherosclerotic 2: Left lower extremity-there was 30-40% eccentric atherosclerotic narrowing of the proximal left common iliac artery with a 10 mm gradient. The left SFA was occluded at its origin and reconstituted in the adductor canal by profunda femoris collaterals. There was three-vessel runoff. 3: Right lower extremity-there was a 90% calcified eccentric proximal right common iliac  artery stenosis with a 90 mm resting gradient. The right SFA was occluded in the midportion of her moderately long distance and was calcified fluoroscopically. There was three-vessel runoff with 80% stenosis in the tibioperoneal trunk.  IMPRESSION:Bradley Ferguson has a subtotal calcified 99% proximal right common iliac artery stenosis along with occluded SFAs bilaterally. We will proceed with diamondback orbital rotational atherectomy, PTA and covered stenting of the calcified right common iliac artery stenosis. Will address the SFAs at a later date.  Final Impression: successful diamondback orbital rotation arthrectomy, PT and covered stent at a subtotal calcified proximal right common iliac artery stenosis with excellent antegrade result. The patient has residual occluded SFAs bilaterally with three-vessel runoff. We will address these are staged fashion. He will be discharged home in the morning on dual antiplatelettherapy and obtain lower extremity arterial Doppler studies and a [employer] office next week. I will see him back one to 2 weeks thereafter.   Patient Profile     66 y.o. male who has limiting claudication. A lower extremity arterial Doppler studies in our office 04/11/17 revealed a high-frequency signal in his right common iliac artery and a total left SFA. He presented for peripheral angiography and potential endovascular therapy for lifestyle limiting claudication  Assessment & Plan    1. PVD: as above;  s/p  successful diamondback orbital rotation arthrectomy, PT and covered stent at a subtotal calcified proximal right common iliac artery stenosis; plan stage procedure for residual occluded SFAs bilaterally with three-vessel runoff.  2. HLD: LDL 172  Start atorvastatin 80 mg  3. Probable COPD with decreased BS, rhochi  Plan dc today; outpatient doppler and f/u Dr. [name]  Signed, Troy Sine, MD, [facility] 04/28/2017, 8:17 AM

## 2017-04-28 NOTE — Discharge Summary (Signed)
Discharge Summary    Patient ID: Bradley Ferguson,  MRN: [id number], DOB/AGE: 02-27-1951 66 y.o.  Admit date: 04/27/2017 Discharge date: 04/28/2017  Primary Care Provider: [name] Primary Cardiologist: [name]   Discharge Diagnoses    Active Problems:   Claudication in peripheral vascular disease ([place])   Allergies Allergies  Allergen Reactions  . Effexor [Venlafaxine]     Feels bad  . Wellbutrin [Bupropion]     sleepy  . Zoloft [Sertraline Hcl]     Felt bad    Diagnostic Studies/Procedures    PV angiogram: 04/27/17  Operators: Dr. Quay Burow  Procedures Performed:            1. Abdominal aortogram/bilateral iliac angiogram/bifemoral runoff            2. Diamondback orbital rotational atherectomy right common iliac artery            3. Covered stent right common iliac artery  Final Impression: successful diamondback orbital rotation arthrectomy, PT and covered stent at a subtotal calcified proximal right common iliac artery stenosis with excellent antegrade result. The patient has residual occluded SFAs bilaterally with three-vessel runoff. We will address these are staged fashion. He will be discharged home in the morning on dual antiplatelettherapy and obtain lower extremity arterial Doppler studies and a [employer] office next week. I will see him back one to 2 weeks thereafter.  Quay Burow. MD, [facility] _____________   History of Present Illness     Bradley Ferguson is a 66 y.o. 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 He was referred by Dr. Melford Aase for peripheral vascular evaluation because of life limiting claudication. His risk factors include treated hyperlipidemia and 45 pack years of tobacco abuse continued to smoke one pack per day. Never had a heart attack or stroke. He did complain of some dyspnea on exertion probably related to COPD. He's had A. fib ablation back in 2011 by Dr. Caryl Comes but has not followed up since. He has complained of less limiting  claudication over the last year which is symmetric and bilateral. He can walk one block after which he had to stop because of discomfort. He has had lower extremity arterial Doppler studies in our office 04/11/17 revealing a high-frequency signal in his right common iliac artery and a total left SFA. Given his symptoms and doppler studies he was referred for outpatient PV study.   Hospital Course     Underwent successful diamond back rotational arthrectomy with stenting to the right common iliac artery. Did have residual occluded SFAs bilaterally with 3v run off. Plan for staged intervention after outpatient follow up . Plan for DAPT with ASA/plavix. Post cath labs showed Cr 0.94 and Hgb 12.2. No complications noted post cath.    [name] was seen by Dr. Claiborne Billings and determined stable for discharge home. Follow up in the office has been arranged. Medications are listed below.  _____________  Discharge Vitals Blood pressure 110/65, pulse 78, temperature 98.5 F (36.9 C), temperature source Oral, resp. rate 19, height 6\' 1"  (1.854 m), weight 197 lb (89.4 kg), SpO2 98 %.  Filed Weights   04/27/17 0542  Weight: 197 lb (89.4 kg)    Labs & Radiologic Studies    CBC  Recent Labs  04/28/17 0510  WBC 7.1  HGB 12.2*  HCT 37.2*  MCV 91.6  PLT 585*   Basic Metabolic Panel  Recent Labs  04/28/17 0510  NA 139  K 4.0  CL 106  CO2 23  GLUCOSE 98  BUN 14  CREATININE 0.94  CALCIUM 8.8*   Liver Function Tests No results for input(s): AST, ALT, ALKPHOS, BILITOT, PROT, ALBUMIN in the last 72 hours. No results for input(s): LIPASE, AMYLASE in the last 72 hours. Cardiac Enzymes No results for input(s): CKTOTAL, CKMB, CKMBINDEX, TROPONINI in the last 72 hours. BNP Invalid input(s): POCBNP D-Dimer No results for input(s): DDIMER in the last 72 hours. Hemoglobin A1C No results for input(s): HGBA1C in the last 72 hours. Fasting Lipid Panel No results for input(s): CHOL, HDL, LDLCALC,  TRIG, CHOLHDL, LDLDIRECT in the last 72 hours. Thyroid Function Tests No results for input(s): TSH, T4TOTAL, T3FREE, THYROIDAB in the last 72 hours.  Invalid input(s): FREET3 _____________  No results found. Disposition   Pt is being discharged home today in good condition.  Follow-up Plans & Appointments    Follow-up Information    [facility] Follow up on 05/13/2017.   Specialty:  Cardiology Why:  at 10am for your follow up dopplers.  Contact information: [address] [place]       Lorretta Harp, MD Follow up on 05/15/2017.   Specialties:  Cardiology, Radiology Why:  at 3:45pm for your follow up appt.  Contact information: [address] [place] [address] [phone]          Discharge Instructions    Call MD for:  redness, tenderness, or signs of infection (pain, swelling, redness, odor or green/yellow discharge around incision site)    Complete by:  As directed    Diet - low sodium heart healthy    Complete by:  As directed    Discharge instructions    Complete by:  As directed    Groin Site Care Refer to this sheet in the next few weeks. These instructions provide you with information on caring for yourself after your procedure. Your caregiver may also give you more specific instructions. Your treatment has been planned according to current medical practices, but problems sometimes occur. Call your caregiver if you have any problems or questions after your procedure. HOME CARE INSTRUCTIONS You may shower 24 hours after the procedure. Remove the bandage (dressing) and gently wash the site with plain soap and water. Gently pat the site dry.  Do not apply powder or lotion to the site.  Do not sit in a bathtub, swimming pool, or whirlpool for 5 to 7 days.  No bending, squatting, or lifting anything over 10 pounds (4.5 kg) as directed by your caregiver.  Inspect the site at  least twice daily.  Do not drive home if you are discharged the same day of the procedure. Have someone else drive you.  You may drive 24 hours after the procedure unless otherwise instructed by your caregiver.  What to expect: Any bruising will usually fade within 1 to 2 weeks.  Blood that collects in the tissue (hematoma) may be painful to the touch. It should usually decrease in size and tenderness within 1 to 2 weeks.  SEEK IMMEDIATE MEDICAL CARE IF: You have unusual pain at the groin site or down the affected leg.  You have redness, warmth, swelling, or pain at the groin site.  You have drainage (other than a small amount of blood on the dressing).  You have chills.  You have a fever or persistent symptoms for more than 72 hours.  You have a fever and your symptoms suddenly get  worse.  Your leg becomes pale, cool, tingly, or numb.  You have heavy bleeding from the site. Hold pressure on the site. Marland Kitchen  PLEASE DO NOT MISS ANY DOSES OF YOUR PLAVIX!!!!! Also keep a log of you blood pressures and bring back to your follow up appt. Please call the office with any questions.   Patients taking blood thinners should generally stay away from medicines like ibuprofen, Advil, Motrin, naproxen, and Aleve due to risk of stomach bleeding. You may take Tylenol as directed or talk to your primary doctor about alternatives.   Increase activity slowly    Complete by:  As directed       Discharge Medications     Medication List    TAKE these medications   ALPRAZolam 1 MG tablet Commonly known as:  XANAX Take 0.5 mg by mouth at bedtime as needed for anxiety.   aspirin 81 MG EC tablet Take 1 tablet (81 mg total) by mouth daily.   atorvastatin 80 MG tablet Commonly known as:  LIPITOR Take 1 tablet (80 mg total) by mouth daily at 6 PM.   clopidogrel 75 MG tablet Commonly known as:  PLAVIX Take 1 tablet (75 mg total) by mouth daily with breakfast.         Outstanding Labs/Studies    FLP/LFTs in 6 weeks.   Duration of Discharge Encounter   Greater than 30 minutes including physician time.  Signed, Reino Bellis NP-C 04/28/2017, 9:17 AM

## 2017-04-28 NOTE — Care Management Note (Signed)
Case Management Note  Patient Details  Name: Bradley Ferguson MRN: [id number] Date of Birth: 08-Nov-1950  Subjective/Objective:   From home with friend, pta indep, s/p pv intervention , will be on plavix,  he states he does not have any RX coverage but he will be able to afford the plavix.  He states he is going to apply for RX coverage during this Medicare enrollement period. He is for dc today.                 Action/Plan: DC home today on plavix.  Expected Discharge Date:  04/28/17               Expected Discharge Plan:  Home/Self Care  In-House Referral:     Discharge planning Services  CM Consult  Post Acute Care Choice:    Choice offered to:     DME Arranged:    DME Agency:     HH Arranged:    HH Agency:     Status of Service:  Completed, signed off  If discussed at [employer] of Stay Meetings, dates discussed:    Additional Comments:  Zenon Mayo, RN 04/28/2017, 10:15 AM

## 2017-05-01 ENCOUNTER — Ambulatory Visit (INDEPENDENT_AMBULATORY_CARE_PROVIDER_SITE_OTHER): Payer: Medicare Other | Admitting: Physician Assistant

## 2017-05-01 ENCOUNTER — Ambulatory Visit: Payer: Self-pay | Admitting: Internal Medicine

## 2017-05-01 ENCOUNTER — Encounter: Payer: Self-pay | Admitting: Physician Assistant

## 2017-05-01 VITALS — BP 120/76 | HR 64 | Temp 97.7°F | Resp 16 | Ht 73.5 in | Wt 196.2 lb

## 2017-05-01 DIAGNOSIS — R21 Rash and other nonspecific skin eruption: Secondary | ICD-10-CM

## 2017-05-01 DIAGNOSIS — H00014 Hordeolum externum left upper eyelid: Secondary | ICD-10-CM | POA: Diagnosis not present

## 2017-05-01 MED ORDER — TRIAMCINOLONE ACETONIDE 0.5 % EX CREA: 1.0000 "application " | TOPICAL_CREAM | Freq: Two times a day (BID) | CUTANEOUS | 2 refills | Status: DC

## 2017-05-01 MED ORDER — KETOCONAZOLE 2 % EX CREA: 1.0000 "application " | TOPICAL_CREAM | Freq: Two times a day (BID) | CUTANEOUS | 0 refills | Status: DC

## 2017-05-01 MED ORDER — NEOMYCIN-POLYMYXIN-DEXAMETH 3.5-10000-0.1 OP SUSP: 1.0000 [drp] | Freq: Four times a day (QID) | OPHTHALMIC | 1 refills | Status: DC

## 2017-05-01 NOTE — Patient Instructions (Signed)
Stye A stye is a bump on your eyelid caused by a bacterial infection. A stye can form inside the eyelid (internal stye) or outside the eyelid (external stye). An internal stye may be caused by an infected oil-producing gland inside your eyelid. An external stye may be caused by an infection at the base of your eyelash (hair follicle). Styes are very common. Anyone can get them at any age. They usually occur in just one eye, but you may have more than one in either eye. What are the causes? The infection is almost always caused by bacteria called Staphylococcus aureus. This is a common type of bacteria that lives on your skin. What increases the risk? You may be at higher risk for a stye if you have had one before. You may also be at higher risk if you have:  Diabetes.  Long-term illness.  Long-term eye redness.  A skin condition called seborrhea.  High fat levels in your blood (lipids).  What are the signs or symptoms? Eyelid pain is the most common symptom of a stye. Internal styes are more painful than external styes. Other signs and symptoms may include:  Painful swelling of your eyelid.  A scratchy feeling in your eye.  Tearing and redness of your eye.  Pus draining from the stye.  How is this diagnosed? Your health care provider may be able to diagnose a stye just by examining your eye. The health care provider may also check to make sure:  You do not have a fever or other signs of a more serious infection.  The infection has not spread to other parts of your eye or areas around your eye.  How is this treated? Most styes will clear up in a few days without treatment. In some cases, you may need to use antibiotic drops or ointment to prevent infection. Your health care provider may have to drain the stye surgically if your stye is:  Large.  Causing a lot of pain.  Interfering with your vision.  This can be done using a thin blade or a needle. Follow these  instructions at home:  Take medicines only as directed by your health care provider.  Apply a clean, warm compress to your eye for 10 minutes, 4 times a day.  Do not wear contact lenses or eye makeup until your stye has healed.  Do not try to pop or drain the stye. Contact a health care provider if:  You have chills or a fever.  Your stye does not go away after several days.  Your stye affects your vision.  Your eyeball becomes swollen, red, or painful. This information is not intended to replace advice given to you by your health care provider. Make sure you discuss any questions you have with your health care provider. Document Released: 03/26/2005 Document Revised: 02/10/2016 Document Reviewed: 09/30/2013 Elsevier Interactive Patient Education  2018 [employer].   Eczema Eczema, also called atopic dermatitis, is a skin disorder that causes inflammation of the skin. It causes a red rash and dry, scaly skin. The skin becomes very itchy. Eczema is generally worse during the cooler winter months and often improves with the warmth of summer. Eczema usually starts showing signs in infancy. Some children outgrow eczema, but it may last through adulthood. What are the causes? The exact cause of eczema is not known, but it appears to run in families. People with eczema often have a family history of eczema, allergies, asthma, or hay fever. Eczema is  not contagious. Flare-ups of the condition may be caused by:  Contact with something you are sensitive or allergic to.  Stress.  What are the signs or symptoms?  Dry, scaly skin.  Red, itchy rash.  Itchiness. This may occur before the skin rash and may be very intense. How is this diagnosed? The diagnosis of eczema is usually made based on symptoms and medical history. How is this treated? Eczema cannot be cured, but symptoms usually can be controlled with treatment and other strategies. A treatment plan might  include:  Controlling the itching and scratching. ? Use over-the-counter antihistamines as directed for itching. This is especially useful at night when the itching tends to be worse. ? Use over-the-counter steroid creams as directed for itching. ? Avoid scratching. Scratching makes the rash and itching worse. It may also result in a skin infection (impetigo) due to a break in the skin caused by scratching.  Keeping the skin well moisturized with creams every day. This will seal in moisture and help prevent dryness. Lotions that contain alcohol and water should be avoided because they can dry the skin.  Limiting exposure to things that you are sensitive or allergic to (allergens).  Recognizing situations that cause stress.  Developing a plan to manage stress.  Follow these instructions at home:  Only take over-the-counter or prescription medicines as directed by your health care provider.  Do not use anything on the skin without checking with your health care provider.  Keep baths or showers short (5 minutes) in warm (not hot) water. Use mild cleansers for bathing. These should be unscented. You may add nonperfumed bath oil to the bath water. It is best to avoid soap and bubble bath.  Immediately after a bath or shower, when the skin is still damp, apply a moisturizing ointment to the entire body. This ointment should be a petroleum ointment. This will seal in moisture and help prevent dryness. The thicker the ointment, the better. These should be unscented.  Keep fingernails cut short. Children with eczema may need to wear soft gloves or mittens at night after applying an ointment.  Dress in clothes made of cotton or cotton blends. Dress lightly, because heat increases itching.  A child with eczema should stay away from anyone with fever blisters or cold sores. The virus that causes fever blisters (herpes simplex) can cause a serious skin infection in children with eczema. Contact a  health care provider if:  Your itching interferes with sleep.  Your rash gets worse or is not better within 1 week after starting treatment.  You see pus or soft yellow scabs in the rash area.  You have a fever.  You have a rash flare-up after contact with someone who has fever blisters. This information is not intended to replace advice given to you by your health care provider. Make sure you discuss any questions you have with your health care provider. Document Released: 06/13/2000 Document Revised: 11/22/2015 Document Reviewed: 01/17/2013 Elsevier Interactive Patient Education  2017 [employer].

## 2017-05-01 NOTE — Progress Notes (Signed)
Subjective:    Patient ID: Bradley Ferguson, male    DOB: 10-19-1950, 66 y.o.   MRN: [id number]  HPI 66 y.o. WM presents with rash on bilateral palms and soles of feet x 10 days. Has been using athelete foot OTC cream that is not helpful. He states he sweats a lot. He has had redness, swelling in his left eye x 5 days. Had arteriogram s/p stent with Dr. [name] on the 04/27/2017 currently on plavix/asa.  No fever, chills, no sore throat. Current every day smoker with smoker's cough. He tried celexa but had diarrhea with it. No other rashes. Some left shoulder pain, some leg pain. Had tick bit in June. No new sexual partners, no urinary symptoms.   Blood pressure 120/76, pulse 64, temperature 97.7 F (36.5 C), resp. rate 16, height 6' 1.5" (1.867 m), weight 196 lb 3.2 oz (89 kg), SpO2 95 %.  Medications Current Outpatient Prescriptions on File Prior to Visit  Medication Sig  . ALPRAZolam (XANAX) 1 MG tablet Take 0.5 mg by mouth at bedtime as needed for anxiety.   Marland Kitchen aspirin EC 81 MG EC tablet Take 1 tablet (81 mg total) by mouth daily.  Marland Kitchen atorvastatin (LIPITOR) 80 MG tablet Take 1 tablet (80 mg total) by mouth daily at 6 PM.  . clopidogrel (PLAVIX) 75 MG tablet Take 1 tablet (75 mg total) by mouth daily with breakfast.   No current facility-administered medications on file prior to visit.     Problem list He has ANXIETY DEPRESSION; TOBACCO ABUSE; RAYNAUDS SYNDROME; VENOUS STASIS ULCER; Attention deficit disorder; VISUAL ACUITY, DECREASED; Dental caries; Bipolar I disorder ([place]); Atrial fibrillation ([place]); Hyperlipidemia; Vitamin D deficiency; Labile HTN; Prediabetes; BPH; and Claudication in peripheral vascular disease ([place]) on his problem list.   Review of Systems  Constitutional: Negative.  Negative for chills, fatigue and fever.  HENT: Negative.  Negative for rhinorrhea and sore throat.   Eyes: Positive for redness. Negative for photophobia, pain, discharge, itching and visual  disturbance.  Respiratory: Positive for cough. Negative for apnea, choking, chest tightness, shortness of breath, wheezing and stridor.   Cardiovascular: Negative.   Gastrointestinal: Negative.   Genitourinary: Negative.  Negative for discharge and dysuria.  Musculoskeletal: Negative.  Negative for arthralgias and joint swelling.  Skin: Positive for rash. Negative for color change, pallor and wound.  Neurological: Negative.   Psychiatric/Behavioral: Negative.        Objective:   Physical Exam  Constitutional: He appears well-developed and well-nourished.  HENT:  Head: Normocephalic and atraumatic.  Right Ear: External ear normal.  Left Ear: External ear normal.  Nose: Nose normal.  Mouth/Throat: Oropharynx is clear and moist.  Eyes: Pupils are equal, round, and reactive to light. EOM are normal. Lids are everted and swept, no foreign bodies found. Right eye exhibits hordeolum. Right eye exhibits no chemosis, no discharge and no exudate. No foreign body present in the right eye. Left eye exhibits no discharge, no exudate and no hordeolum. No foreign body present in the left eye. Right conjunctiva is not injected. Right conjunctiva has no hemorrhage. Left conjunctiva is not injected. Left conjunctiva has no hemorrhage. No scleral icterus.  Neck: Normal range of motion. Neck supple.  Cardiovascular: Normal rate and regular rhythm.   Pulmonary/Chest: Effort normal.  Lymphadenopathy:    He has no cervical adenopathy.  Skin:  Erythematous macules along bilateral palms and soles of feet, scaly, centrally located without any satellite lesions       Assessment &  Plan:    Rash of hands and soles of feet x 10 days Check labs, ? Dermatosis, will treat with creams, if not better may do oral medication -     CBC with Differential/Platelet -     BASIC METABOLIC PANEL WITH GFR -     Hepatic function panel -     RPR -     Rocky mtn spotted fvr abs pnl(IgG+IgM) -     triamcinolone cream  (KENALOG) 0.5 %; Apply 1 application topically 2 (two) times daily. -     ketoconazole (NIZORAL) 2 % cream; Apply 1 application topically 2 (two) times daily.  Hordeolum externum of left upper eyelid -     neomycin-polymyxin b-dexamethasone (MAXITROL) 3.5-10000-0.1 SUSP; Place 1 drop into the left eye 4 (four) times daily.

## 2017-05-04 LAB — BASIC METABOLIC PANEL WITH GFR
BUN: 12 mg/dL (ref 7–25)
CALCIUM: 9.5 mg/dL (ref 8.6–10.3)
CHLORIDE: 105 mmol/L (ref 98–110)
CO2: 28 mmol/L (ref 20–32)
Creat: 1.1 mg/dL (ref 0.70–1.25)
GFR, EST AFRICAN AMERICAN: 81 mL/min/{1.73_m2} (ref >=60)
GFR, EST NON AFRICAN AMERICAN: 70 mL/min/{1.73_m2} (ref >=60)
Glucose, Bld: 108 mg/dL — ABNORMAL HIGH (ref 65–99)
Potassium: 5.2 mmol/L (ref 3.5–5.3)
Sodium: 143 mmol/L (ref 135–146)

## 2017-05-04 LAB — CBC WITH DIFFERENTIAL/PLATELET
BASOS PCT: 0.9 %
Basophils Absolute: 72 cells/uL (ref 0–200)
Eosinophils Absolute: 536 cells/uL — ABNORMAL HIGH (ref 15–500)
Eosinophils Relative: 6.7 %
HEMATOCRIT: 41.7 % (ref 38.5–50.0)
Hemoglobin: 14.3 g/dL (ref 13.2–17.1)
LYMPHS ABS: 1720 {cells}/uL (ref 850–3900)
MCH: 30.9 pg (ref 27.0–33.0)
MCHC: 34.3 g/dL (ref 32.0–36.0)
MCV: 90.1 fL (ref 80.0–100.0)
MPV: 11.5 fL (ref 7.5–12.5)
Monocytes Relative: 9.4 %
Neutro Abs: 4920 cells/uL (ref 1500–7800)
Neutrophils Relative %: 61.5 %
Platelets: 166 10*3/uL (ref 140–400)
RBC: 4.63 10*6/uL (ref 4.20–5.80)
RDW: 13.5 % (ref 11.0–15.0)
Total Lymphocyte: 21.5 %
WBC: 8 10*3/uL (ref 3.8–10.8)
WBCMIX: 752 {cells}/uL (ref 200–950)

## 2017-05-04 LAB — HEPATIC FUNCTION PANEL
AG Ratio: 1.7 (calc) (ref 1.0–2.5)
ALBUMIN MSPROF: 4.4 g/dL (ref 3.6–5.1)
ALT: 19 U/L (ref 9–46)
AST: 27 U/L (ref 10–35)
Alkaline phosphatase (APISO): 59 U/L (ref 40–115)
BILIRUBIN DIRECT: 0.1 mg/dL (ref 0.0–0.2)
BILIRUBIN TOTAL: 0.6 mg/dL (ref 0.2–1.2)
GLOBULIN: 2.6 g/dL (ref 1.9–3.7)
Indirect Bilirubin: 0.5 mg/dL (calc) (ref 0.2–1.2)
Total Protein: 7 g/dL (ref 6.1–8.1)

## 2017-05-04 LAB — ROCKY MTN SPOTTED FVR ABS PNL(IGG+IGM)
RMSF IgG: NOT DETECTED
RMSF IgM: NOT DETECTED

## 2017-05-04 LAB — RPR: RPR Ser Ql: NONREACTIVE

## 2017-05-13 ENCOUNTER — Ambulatory Visit (HOSPITAL_COMMUNITY)
Admission: RE | Admit: 2017-05-13 | Discharge: 2017-05-13 | Disposition: A | Discharge status: Home / Self Care | Payer: Medicare Other | Source: Ambulatory Visit | Attending: Cardiovascular Disease | Admitting: Cardiovascular Disease

## 2017-05-13 ENCOUNTER — Ambulatory Visit (HOSPITAL_BASED_OUTPATIENT_CLINIC_OR_DEPARTMENT_OTHER)
Admission: RE | Admit: 2017-05-13 | Discharge: 2017-05-13 | Disposition: A | Discharge status: Home / Self Care | Payer: Medicare Other | Source: Ambulatory Visit | Attending: Cardiovascular Disease | Admitting: Cardiovascular Disease

## 2017-05-13 DIAGNOSIS — I739 Peripheral vascular disease, unspecified: Secondary | ICD-10-CM

## 2017-05-15 ENCOUNTER — Encounter: Payer: Self-pay | Admitting: Cardiovascular Disease

## 2017-05-15 ENCOUNTER — Ambulatory Visit (INDEPENDENT_AMBULATORY_CARE_PROVIDER_SITE_OTHER): Payer: Medicare Other | Admitting: Cardiovascular Disease

## 2017-05-15 VITALS — BP 120/66 | HR 90 | Ht 73.5 in | Wt 198.4 lb

## 2017-05-15 DIAGNOSIS — I10 Essential (primary) hypertension: Secondary | ICD-10-CM | POA: Diagnosis not present

## 2017-05-15 DIAGNOSIS — F17200 Nicotine dependence, unspecified, uncomplicated: Secondary | ICD-10-CM | POA: Diagnosis not present

## 2017-05-15 DIAGNOSIS — E785 Hyperlipidemia, unspecified: Secondary | ICD-10-CM

## 2017-05-15 DIAGNOSIS — I739 Peripheral vascular disease, unspecified: Secondary | ICD-10-CM | POA: Diagnosis not present

## 2017-05-15 NOTE — Assessment & Plan Note (Signed)
History of hyperlipidemia on high-dose statin therapy with recent lipid profile performed 02/05/17 revealing total cholesterol 247, LDL 172 and HDL 40.

## 2017-05-15 NOTE — Assessment & Plan Note (Signed)
History of labile hypertension blood pressure measured today at 120/66. He is not on antihypertensive medications.

## 2017-05-15 NOTE — Patient Instructions (Signed)
Medication Instructions: Your physician recommends that you continue on your current medications as directed. Please refer to the Current Medication list given to you today.   Labwork: Your physician recommends that you return for a FASTING lipid profile and hepatic function panel in 2 months.  Follow-Up: Your physician wants you to follow-up in: 6 months with Dr. [name]. You will receive a reminder letter in the mail two months in advance. If you don't receive a letter, please call our office to schedule the follow-up appointment.  If you need a refill on your cardiac medications before your next appointment, please call your pharmacy.

## 2017-05-15 NOTE — Assessment & Plan Note (Signed)
History of peripheral arterial disease status post post dilated that were rotational atherectomy, PT and cover stenting of a subtotal calcified right common iliac artery stenosis on 04/27/17.(VBX 7 X 72mm). Follow-up Doppler showed an improvement in the right ABI and velocities in the right common iliac artery. This claudication is improved. He does continue to smoke however. We will continue to follow him noninvasively every 6 months Al[place] back after that follow-up.

## 2017-05-15 NOTE — Progress Notes (Signed)
05/15/2017 Bradley Ferguson   Sep 21, 1950  [id number]  Primary Physician [name], MD Primary Cardiologist: Lorretta Harp MD [name], [place]  HPI:  Bradley Ferguson is a 66 y.o. male separated, father of 49, grandfather one grandchild who works as a Systems developer. He was referred by Dr. Melford Aase for peripheral vascular evaluation because of life limiting claudication. I last saw him in the office 04/16/17. His risk factors include treated hyperlipidemia and 45 pack years of tobacco abuse continued to smoke one pack per day. Never had a heart attack or stroke. He does complain of some dyspnea on exertion probably related to COPD. He's had A. fib ablation back in 2011 by Dr. Caryl Comes but has not followed up since. He has complained of less limiting claudication over the last year which is symmetric and bilateral. He can walk one block after which she has to stop because of discomfort. He has had lower extremity arterial Doppler studies in our office 04/11/17 revealing a high-frequency signal in his right common iliac artery and a total left SFA. I performed peripheral angiography on him 04/27/17 revealing a 99% calcified eccentric proximal right common iliac artery stenosis and bilateral long segment SFA chronic total occlusions which were highly calcified. I performed direct back orbital rotational atherectomy, PTCA and cover stenting using a 7 mm x 39 mm long VBX covered stent. Excellent angiographic result. The claudication has somewhat improved as has his Doppler studies. I do not think his SFAs are easily percutaneously addressable.     Current Meds  Medication Sig  . ALPRAZolam (XANAX) 1 MG tablet Take 0.5 mg by mouth at bedtime as needed for anxiety.   Marland Kitchen aspirin EC 81 MG EC tablet Take 1 tablet (81 mg total) by mouth daily.  Marland Kitchen atorvastatin (LIPITOR) 80 MG tablet Take 1 tablet (80 mg total) by mouth daily at 6 PM.  . clopidogrel (PLAVIX) 75 MG tablet Take 1 tablet (75 mg total) by  mouth daily with breakfast.  . ketoconazole (NIZORAL) 2 % cream Apply 1 application topically 2 (two) times daily.  Marland Kitchen neomycin-polymyxin b-dexamethasone (MAXITROL) 3.5-10000-0.1 SUSP Place 1 drop into the left eye 4 (four) times daily.  Marland Kitchen triamcinolone cream (KENALOG) 0.5 % Apply 1 application topically 2 (two) times daily.     Allergies  Allergen Reactions  . Effexor [Venlafaxine]     Feels bad  . Wellbutrin [Bupropion]     sleepy  . Zoloft [Sertraline Hcl]     Felt bad    Social History   Socioeconomic History  . Marital status: Legally Separated    Spouse name: Not on file  . Number of children: Not on file  . Years of education: Not on file  . Highest education level: Not on file  Social Needs  . Financial resource strain: Not on file  . Food insecurity - worry: Not on file  . Food insecurity - inability: Not on file  . Transportation needs - medical: Not on file  . Transportation needs - non-medical: Not on file  Occupational History  . Not on file  Tobacco Use  . Smoking status: Current Every Day Smoker    Packs/day: 0.75    Types: Cigarettes  . Smokeless tobacco: Never Used  Substance and Sexual Activity  . Alcohol use: Yes    Alcohol/week: 2.4 oz    Types: 4 Standard drinks or equivalent per week    Comment: 3-4 drinks per week  . Drug use:  Not on file  . Sexual activity: Not on file  Other Topics Concern  . Not on file  Social History Narrative  . Not on file     Review of Systems: General: negative for chills, fever, night sweats or weight changes.  Cardiovascular: negative for chest pain, dyspnea on exertion, edema, orthopnea, palpitations, paroxysmal nocturnal dyspnea or shortness of breath Dermatological: negative for rash Respiratory: negative for cough or wheezing Urologic: negative for hematuria Abdominal: negative for nausea, vomiting, diarrhea, bright red blood per rectum, melena, or hematemesis Neurologic: negative for visual changes,  syncope, or dizziness All other systems reviewed and are otherwise negative except as noted above.    Blood pressure 120/66, pulse 90, height 6' 1.5" (1.867 m), weight 198 lb 6.4 oz (90 kg), SpO2 99 %.  General appearance: alert and no distress Neck: no adenopathy, no carotid bruit, no JVD, supple, symmetrical, trachea midline and thyroid not enlarged, symmetric, no tenderness/mass/nodules Lungs: clear to auscultation bilaterally Heart: regular rate and rhythm, S1, S2 normal, no murmur, click, rub or gallop Extremities: extremities normal, atraumatic, no cyanosis or edema Pulses: Absent pedal pulses bilaterally Skin: Skin color, texture, turgor normal. No rashes or lesions Neurologic: Alert and oriented X 3, normal strength and tone. Normal symmetric reflexes. Normal coordination and gait  EKG not performed today  ASSESSMENT AND PLAN:   Claudication in peripheral vascular disease ([place]) History of peripheral arterial disease status post post dilated that were rotational atherectomy, PT and cover stenting of a subtotal calcified right common iliac artery stenosis on 04/27/17.(VBX 7 X 55mm). Follow-up Doppler showed an improvement in the right ABI and velocities in the right common iliac artery. This claudication is improved. He does continue to smoke however. We will continue to follow him noninvasively every 6 months Alcian back after that follow-up.  Hyperlipidemia History of hyperlipidemia on high-dose statin therapy with recent lipid profile performed 02/05/17 revealing total cholesterol 247, LDL 172 and HDL 40.  TOBACCO ABUSE History of continued tobacco abuse recalcitrant to risk factor modification. Continue to smoke one half pack per day.  Labile HTN History of labile hypertension blood pressure measured today at 120/66. He is not on antihypertensive medications.      Lorretta Harp MD FACP,FACC,FAHA, [facility] 05/15/2017 4:17 PM

## 2017-05-15 NOTE — Assessment & Plan Note (Signed)
History of continued tobacco abuse recalcitrant to risk factor modification. Continue to smoke one half pack per day.

## 2017-06-09 ENCOUNTER — Encounter: Payer: Self-pay | Admitting: Internal Medicine

## 2017-08-10 DIAGNOSIS — E785 Hyperlipidemia, unspecified: Secondary | ICD-10-CM | POA: Diagnosis not present

## 2017-08-11 LAB — HEPATIC FUNCTION PANEL
ALK PHOS: 73 IU/L (ref 39–117)
ALT: 19 IU/L (ref 0–44)
AST: 22 IU/L (ref 0–40)
Albumin: 4.7 g/dL (ref 3.6–4.8)
Bilirubin Total: 0.5 mg/dL (ref 0.0–1.2)
Bilirubin, Direct: 0.16 mg/dL (ref 0.00–0.40)
Total Protein: 6.9 g/dL (ref 6.0–8.5)

## 2017-08-11 LAB — LIPID PANEL
CHOLESTEROL TOTAL: 132 mg/dL (ref 100–199)
Chol/HDL Ratio: 3 ratio (ref 0.0–5.0)
HDL: 44 mg/dL (ref >39)
LDL Calculated: 67 mg/dL (ref 0–99)
Triglycerides: 107 mg/dL (ref 0–149)
VLDL CHOLESTEROL CAL: 21 mg/dL (ref 5–40)

## 2017-10-06 ENCOUNTER — Telehealth: Payer: Self-pay | Admitting: Cardiovascular Disease

## 2017-10-06 NOTE — Telephone Encounter (Signed)
Left detailed message with results on name-verified VM  Notes recorded by Lorretta Harp, MD on 08/11/2017 at 7:07 AM EST Markedly improved FLP!!!

## 2017-10-06 NOTE — Telephone Encounter (Signed)
New Message    Patient is calling in reference to lab results from Feb. Please call to discuss.

## 2017-11-10 ENCOUNTER — Other Ambulatory Visit: Payer: Self-pay | Admitting: *Deleted

## 2017-11-10 MED ORDER — CLOPIDOGREL BISULFATE 75 MG PO TABS: 75.0000 mg | ORAL_TABLET | Freq: Every day | ORAL | 2 refills | Status: DC

## 2017-11-10 MED ORDER — ATORVASTATIN CALCIUM 80 MG PO TABS: 80.0000 mg | ORAL_TABLET | Freq: Every day | ORAL | 2 refills | Status: DC

## 2017-11-10 NOTE — Telephone Encounter (Signed)
PT NEEDED RX FILLED OF ATORVASTATIN AND PLAVIX AND WAS SCHEDULED FOR 6 MONTH FOLLOW WITH DR [name]

## 2017-12-01 ENCOUNTER — Ambulatory Visit (INDEPENDENT_AMBULATORY_CARE_PROVIDER_SITE_OTHER): Payer: Medicare Other | Admitting: Cardiovascular Disease

## 2017-12-01 ENCOUNTER — Encounter: Payer: Self-pay | Admitting: Cardiovascular Disease

## 2017-12-01 VITALS — BP 124/77 | HR 106 | Ht 73.5 in | Wt 200.2 lb

## 2017-12-01 DIAGNOSIS — I739 Peripheral vascular disease, unspecified: Secondary | ICD-10-CM | POA: Diagnosis not present

## 2017-12-01 DIAGNOSIS — I10 Essential (primary) hypertension: Secondary | ICD-10-CM

## 2017-12-01 MED ORDER — ATORVASTATIN CALCIUM 80 MG PO TABS: 80.0000 mg | ORAL_TABLET | Freq: Every day | ORAL | 3 refills | Status: DC

## 2017-12-01 MED ORDER — CLOPIDOGREL BISULFATE 75 MG PO TABS: 75.0000 mg | ORAL_TABLET | Freq: Every day | ORAL | 3 refills | Status: DC

## 2017-12-01 NOTE — Patient Instructions (Addendum)
Medication Instructions: Your physician recommends that you continue on your current medications as directed. Please refer to the Current Medication list given to you today.  Testing:  Now and every 6 months: Your physician has requested that you have a aorta and iliac duplex. During this test, an ultrasound is used to evaluate blood flow to the aorta and iliac arteries. Allow one hour for this exam. Do not eat after midnight the day before and avoid carbonated beverages.   Your physician has requested that you have an ankle brachial index (ABI). During this test an ultrasound and blood pressure cuff are used to evaluate the arteries that supply the arms and legs with blood. Allow thirty minutes for this exam. There are no restrictions or special instructions.   Follow-Up: Your physician wants you to follow-up in: 1 year with Dr. [name]. You will receive a reminder letter in the mail two months in advance. If you don't receive a letter, please call our office to schedule the follow-up appointment.  If you need a refill on your cardiac medications before your next appointment, please call your pharmacy.

## 2017-12-01 NOTE — Progress Notes (Signed)
12/01/2017 Bradley Ferguson   04/01/1951  [id number]  Primary Physician [name], MD Primary Cardiologist: Lorretta Harp MD Bradley Ferguson, [place]  HPI:  Bradley Ferguson is a 67 y.o.  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 He was referred by Dr.McKeownfor peripheral vascular evaluation because of life limiting claudication. I last saw him in the office 05/15/2017. His risk factors include treated hyperlipidemia and 45 pack years of tobacco abuse continued to smoke one pack per day. Never had a heart attack or stroke. He does complain of some dyspnea on exertion probably related to COPD. He's had A. fib ablation back in 2011 by Dr. Caryl Comes but has not followed up since. He has complained of less limiting claudication over the last year which is symmetric and bilateral. He can walk one block after which she has to stop because of discomfort. He has had lower extremity arterial Doppler studies in our office 04/11/17 revealing a high-frequency signal in his right common iliac artery and a total left SFA. I performed peripheral angiography on him 04/27/17 revealing a 99% calcified eccentric proximal right common iliac artery stenosis and bilateral long segment SFA chronic total occlusions which were highly calcified. I performed direct back orbital rotational atherectomy, PTCA and cover stenting using a 7 mm x 39 mm long VBX covered stent. Excellent angiographic result. The claudication has somewhat improved as has his Doppler studies. I do not think his SFAs are easily percutaneously addressable. Since I saw him 6 months ago he is remained stable.  He does complain of some residual claudication which has significantly improved since his iliac stenting.  He does continue to smoke however.    Current Meds  Medication Sig  . ALPRAZolam (XANAX) 1 MG tablet Take 0.5 mg by mouth at bedtime as needed for anxiety.   Marland Kitchen aspirin EC 81 MG EC tablet Take 1 tablet  (81 mg total) by mouth daily.  Marland Kitchen atorvastatin (LIPITOR) 80 MG tablet Take 1 tablet (80 mg total) by mouth daily at 6 PM.  . clopidogrel (PLAVIX) 75 MG tablet Take 1 tablet (75 mg total) by mouth daily with breakfast.  . ketoconazole (NIZORAL) 2 % cream Apply 1 application topically 2 (two) times daily.  Marland Kitchen neomycin-polymyxin b-dexamethasone (MAXITROL) 3.5-10000-0.1 SUSP Place 1 drop into the left eye 4 (four) times daily.  Marland Kitchen triamcinolone cream (KENALOG) 0.5 % Apply 1 application topically 2 (two) times daily.  . [DISCONTINUED] atorvastatin (LIPITOR) 80 MG tablet Take 1 tablet (80 mg total) by mouth daily at 6 PM.  . [DISCONTINUED] clopidogrel (PLAVIX) 75 MG tablet Take 1 tablet (75 mg total) by mouth daily with breakfast.     Allergies  Allergen Reactions  . Effexor [Venlafaxine]     Feels bad  . Wellbutrin [Bupropion]     sleepy  . Zoloft [Sertraline Hcl]     Felt bad    Social History   Socioeconomic History  . Marital status: Legally Separated    Spouse name: Not on file  . Number of children: Not on file  . Years of education: Not on file  . Highest education level: Not on file  Occupational History  . Not on file  Social Needs  . Financial resource strain: Not on file  . Food insecurity:    Worry: Not on file    Inability: Not on file  . Transportation needs:    Medical: Not on file  Non-medical: Not on file  Tobacco Use  . Smoking status: Current Every Day Smoker    Packs/day: 0.75    Types: Cigarettes  . Smokeless tobacco: Never Used  Substance and Sexual Activity  . Alcohol use: Yes    Alcohol/week: 2.4 oz    Types: 4 Standard drinks or equivalent per week    Comment: 3-4 drinks per week  . Drug use: Not on file  . Sexual activity: Not on file  Lifestyle  . Physical activity:    Days per week: Not on file    Minutes per session: Not on file  . Stress: Not on file  Relationships  . Social connections:    Talks on phone: Not on file    Gets together:  Not on file    Attends religious service: Not on file    Active member of club or organization: Not on file    Attends meetings of clubs or organizations: Not on file    Relationship status: Not on file  . Intimate partner violence:    Fear of current or ex partner: Not on file    Emotionally abused: Not on file    Physically abused: Not on file    Forced sexual activity: Not on file  Other Topics Concern  . Not on file  Social History Narrative  . Not on file     Review of Systems: General: negative for chills, fever, night sweats or weight changes.  Cardiovascular: negative for chest pain, dyspnea on exertion, edema, orthopnea, palpitations, paroxysmal nocturnal dyspnea or shortness of breath Dermatological: negative for rash Respiratory: negative for cough or wheezing Urologic: negative for hematuria Abdominal: negative for nausea, vomiting, diarrhea, bright red blood per rectum, melena, or hematemesis Neurologic: negative for visual changes, syncope, or dizziness All other systems reviewed and are otherwise negative except as noted above.    Blood pressure 124/77, pulse (!) 106, height 6' 1.5" (1.867 m), weight 200 lb 3.2 oz (90.8 kg).  General appearance: alert and no distress Neck: no adenopathy, no carotid bruit, no JVD, supple, symmetrical, trachea midline and thyroid not enlarged, symmetric, no tenderness/mass/nodules Lungs: clear to auscultation bilaterally Heart: regular rate and rhythm, S1, S2 normal, no murmur, click, rub or gallop Extremities: extremities normal, atraumatic, no cyanosis or edema Pulses: Absent pedal pulses bilaterally Skin: Skin color, texture, turgor normal. No rashes or lesions Neurologic: Alert and oriented X 3, normal strength and tone. Normal symmetric reflexes. Normal coordination and gait  EKG not performed today  ASSESSMENT AND PLAN:   TOBACCO ABUSE History of ongoing tobacco abuse recalcitrant risk factor modification  Atrial  fibrillation ([place]) History of atrial fibrillation ablation by Dr. Caryl Comes in 2011.  Hyperlipidemia History of hyperlipidemia on statin therapy profile performed 08/10/2017 revealing an LDL of 67 and HDL 44.  Labile HTN History of labile hypertension her blood pressure measured at 124/77.  He is not on antihypertensive medications.  Claudication in peripheral vascular disease ([place]) History of peripheral arterial disease that is post peripheral angiography 04/27/2017 revealing a 95% proximal right common iliac artery stenosis which I performed diamondback orbital rotational atherectomy, PTA and covered stenting on.  He did have a 40 to 50% proximal left common iliac artery stenosis with a 10 mm gradient and occluded calcified bilateral SFA chronic total occlusions with three-vessel runoff.  His claudication did improve.  I reviewed his angiogram and did not think his SFAs were percutaneously addressable.  He continues on dual antiplatelet therapy.  Lorretta Harp MD FACP,FACC,FAHA, [facility] 12/01/2017 4:05 PM

## 2017-12-01 NOTE — Assessment & Plan Note (Signed)
History of atrial fibrillation ablation by Dr. Caryl Comes in 2011.

## 2017-12-01 NOTE — Assessment & Plan Note (Signed)
History of labile hypertension her blood pressure measured at 124/77.  He is not on antihypertensive medications.

## 2017-12-01 NOTE — Assessment & Plan Note (Signed)
History of hyperlipidemia on statin therapy profile performed 08/10/2017 revealing an LDL of 67 and HDL 44.

## 2017-12-01 NOTE — Assessment & Plan Note (Signed)
History of peripheral arterial disease that is post peripheral angiography 04/27/2017 revealing a 95% proximal right common iliac artery stenosis which I performed diamondback orbital rotational atherectomy, PTA and covered stenting on.  He did have a 40 to 50% proximal left common iliac artery stenosis with a 10 mm gradient and occluded calcified bilateral SFA chronic total occlusions with three-vessel runoff.  His claudication did improve.  I reviewed his angiogram and did not think his SFAs were percutaneously addressable.  He continues on dual antiplatelet therapy.

## 2017-12-01 NOTE — Assessment & Plan Note (Signed)
History of ongoing tobacco abuse recalcitrant risk factor modification 

## 2017-12-11 ENCOUNTER — Ambulatory Visit (HOSPITAL_COMMUNITY)
Admission: RE | Admit: 2017-12-11 | Discharge: 2017-12-11 | Disposition: A | Discharge status: Home / Self Care | Payer: Medicare Other | Source: Ambulatory Visit | Attending: Cardiology | Admitting: Cardiology

## 2017-12-11 DIAGNOSIS — I10 Essential (primary) hypertension: Secondary | ICD-10-CM | POA: Insufficient documentation

## 2017-12-11 DIAGNOSIS — I739 Peripheral vascular disease, unspecified: Secondary | ICD-10-CM | POA: Insufficient documentation

## 2017-12-11 DIAGNOSIS — F17200 Nicotine dependence, unspecified, uncomplicated: Secondary | ICD-10-CM | POA: Insufficient documentation

## 2017-12-11 DIAGNOSIS — E785 Hyperlipidemia, unspecified: Secondary | ICD-10-CM | POA: Insufficient documentation

## 2017-12-11 DIAGNOSIS — R9389 Abnormal findings on diagnostic imaging of other specified body structures: Secondary | ICD-10-CM | POA: Insufficient documentation

## 2017-12-15 ENCOUNTER — Other Ambulatory Visit: Payer: Self-pay | Admitting: *Deleted

## 2017-12-15 DIAGNOSIS — I739 Peripheral vascular disease, unspecified: Secondary | ICD-10-CM

## 2017-12-24 ENCOUNTER — Telehealth: Payer: Self-pay | Admitting: *Deleted

## 2017-12-24 ENCOUNTER — Other Ambulatory Visit: Payer: Self-pay | Admitting: *Deleted

## 2017-12-24 DIAGNOSIS — I739 Peripheral vascular disease, unspecified: Secondary | ICD-10-CM

## 2017-12-24 NOTE — Telephone Encounter (Signed)
Notes recorded by Lorretta Harp, MD on 12/14/2017 at 3:30 PM EDT Patent iliac stent. ABIs stable. Repeat 6 months   pt aware of results  Order placed

## 2018-02-23 ENCOUNTER — Encounter: Payer: Self-pay | Admitting: Internal Medicine

## 2018-02-23 ENCOUNTER — Ambulatory Visit (INDEPENDENT_AMBULATORY_CARE_PROVIDER_SITE_OTHER): Payer: Medicare Other | Admitting: Internal Medicine

## 2018-02-23 VITALS — BP 122/84 | HR 83 | Temp 98.3°F | Resp 12 | Ht 73.5 in | Wt 202.2 lb

## 2018-02-23 DIAGNOSIS — Z79899 Other long term (current) drug therapy: Secondary | ICD-10-CM | POA: Diagnosis not present

## 2018-02-23 DIAGNOSIS — R0989 Other specified symptoms and signs involving the circulatory and respiratory systems: Secondary | ICD-10-CM | POA: Diagnosis not present

## 2018-02-23 DIAGNOSIS — R7309 Other abnormal glucose: Secondary | ICD-10-CM

## 2018-02-23 DIAGNOSIS — Z1211 Encounter for screening for malignant neoplasm of colon: Secondary | ICD-10-CM

## 2018-02-23 DIAGNOSIS — Z8249 Family history of ischemic heart disease and other diseases of the circulatory system: Secondary | ICD-10-CM

## 2018-02-23 DIAGNOSIS — Z125 Encounter for screening for malignant neoplasm of prostate: Secondary | ICD-10-CM

## 2018-02-23 DIAGNOSIS — E559 Vitamin D deficiency, unspecified: Secondary | ICD-10-CM | POA: Diagnosis not present

## 2018-02-23 DIAGNOSIS — F17200 Nicotine dependence, unspecified, uncomplicated: Secondary | ICD-10-CM

## 2018-02-23 DIAGNOSIS — R7303 Prediabetes: Secondary | ICD-10-CM | POA: Diagnosis not present

## 2018-02-23 DIAGNOSIS — E782 Mixed hyperlipidemia: Secondary | ICD-10-CM

## 2018-02-23 DIAGNOSIS — F419 Anxiety disorder, unspecified: Secondary | ICD-10-CM

## 2018-02-23 DIAGNOSIS — Z136 Encounter for screening for cardiovascular disorders: Secondary | ICD-10-CM

## 2018-02-23 DIAGNOSIS — I739 Peripheral vascular disease, unspecified: Secondary | ICD-10-CM | POA: Diagnosis not present

## 2018-02-23 DIAGNOSIS — N401 Enlarged prostate with lower urinary tract symptoms: Secondary | ICD-10-CM | POA: Diagnosis not present

## 2018-02-23 DIAGNOSIS — N138 Other obstructive and reflux uropathy: Secondary | ICD-10-CM | POA: Diagnosis not present

## 2018-02-23 DIAGNOSIS — Z1212 Encounter for screening for malignant neoplasm of rectum: Secondary | ICD-10-CM

## 2018-02-23 DIAGNOSIS — I480 Paroxysmal atrial fibrillation: Secondary | ICD-10-CM

## 2018-02-23 MED ORDER — CLOPIDOGREL BISULFATE 75 MG PO TABS: ORAL_TABLET | ORAL | 1 refills | Status: DC

## 2018-02-23 MED ORDER — ALPRAZOLAM 1 MG PO TABS: ORAL_TABLET | ORAL | 0 refills | Status: DC

## 2018-02-23 MED ORDER — ATORVASTATIN CALCIUM 80 MG PO TABS: ORAL_TABLET | ORAL | 1 refills | Status: DC

## 2018-02-23 MED ORDER — RIVAROXABAN 20 MG PO TABS
ORAL_TABLET | ORAL | 11 refills | Status: DC
Start: 2018-02-23 — End: 2018-02-23

## 2018-02-23 MED ORDER — RIVAROXABAN 20 MG PO TABS: ORAL_TABLET | ORAL | 11 refills | Status: DC

## 2018-02-23 NOTE — Addendum Note (Signed)
Addended by: [name] on: 02/23/2018 03:39 PM   Modules accepted: Orders

## 2018-02-23 NOTE — Patient Instructions (Addendum)
We Do NOT Approve of  [facility], [employer]  Our Patients  To Do Home Visits  & We Do NOT Approve of  [facility] > > > > > > > > > > > > > > > > > > > > > > > > > > > > > > > > > > >  > > > > Atrial Fibrillation Atrial fibrillation is a type of irregular or rapid heartbeat (arrhythmia). In atrial fibrillation, the heart quivers continuously in a chaotic pattern. This occurs when parts of the heart receive disorganized signals that make the heart unable to pump blood normally. This can increase the risk for stroke, heart failure, and other heart-related conditions. There are different types of atrial fibrillation, including:  Paroxysmal atrial fibrillation. This type starts suddenly, and it usually stops on its own shortly after it starts.  Persistent atrial fibrillation. This type often lasts longer than a week. It may stop on its own or with treatment.  Long-lasting persistent atrial fibrillation. This type lasts longer than 12 months.  Permanent atrial fibrillation. This type does not go away.  Talk with your health care provider to learn about the type of atrial fibrillation that you have. What are the causes? This condition is caused by some heart-related conditions or procedures, including:  A heart attack.  Coronary artery disease.  Heart failure.  Heart valve conditions.  High blood pressure.  Inflammation of the sac that surrounds the heart (pericarditis).  Heart surgery.  Certain heart rhythm disorders, such as Wolf-Parkinson-White syndrome.  Other causes include:  Pneumonia.  Obstructive sleep apnea.  Blockage of an artery in the lungs (pulmonary embolism, or PE).  Lung cancer.  Chronic lung disease.  Thyroid problems, especially if the thyroid is overactive (hyperthyroidism).  Caffeine.  Excessive alcohol use or illegal drug use.  Use of some medicines, including certain decongestants and diet pills.  Sometimes, the  cause cannot be found. What increases the risk? This condition is more likely to develop in:  People who are older in age.  People who smoke.  People who have diabetes mellitus.  People who are overweight (obese).  Athletes who exercise vigorously.  What are the signs or symptoms? Symptoms of this condition include:  A feeling that your heart is beating rapidly or irregularly.  A feeling of discomfort or pain in your chest.  Shortness of breath.  Sudden light-headedness or weakness.  Getting tired easily during exercise.  In some cases, there are no symptoms. How is this diagnosed? Your health care provider may be able to detect atrial fibrillation when taking your pulse. If detected, this condition may be diagnosed with:  An electrocardiogram (ECG).  A Holter monitor test that records your heartbeat patterns over a 24-hour period.  Transthoracic echocardiogram (TTE) to evaluate how blood flows through your heart.  Transesophageal echocardiogram (TEE) to view more detailed images of your heart.  A stress test.  Imaging tests, such as a CT scan or chest X-ray.  Blood tests.  How is this treated? The main goals of treatment are to prevent blood clots from forming and to keep your heart beating at a normal rate and rhythm. The type of treatment that you receive depends on many factors, such as your underlying medical conditions and how you feel when you are experiencing atrial fibrillation. This condition may be treated with:  Medicine to slow down the heart rate, bring the heart's rhythm back to normal, or prevent clots from forming.  Electrical cardioversion. This is a procedure that resets your heart's rhythm by delivering a controlled, low-energy shock to the heart through your skin.  Different types of ablation, such as catheter ablation, catheter ablation with pacemaker, or surgical ablation. These procedures destroy the heart tissues that send abnormal  signals. When the pacemaker is used, it is placed under your skin to help your heart beat in a regular rhythm.  Follow these instructions at home:  Take over-the counter and prescription medicines only as told by your health care provider.  If your health care provider prescribed a blood-thinning medicine (anticoagulant), take it exactly as told. Taking too much blood-thinning medicine can cause bleeding. If you do not take enough blood-thinning medicine, you will not have the protection that you need against stroke and other problems.  Do not use tobacco products, including cigarettes, chewing tobacco, and e-cigarettes. If you need help quitting, ask your health care provider.  If you have obstructive sleep apnea, manage your condition as told by your health care provider.  Do not drink alcohol.  Do not drink beverages that contain caffeine, such as coffee, soda, and tea.  Maintain a healthy weight. Do not use diet pills unless your health care provider approves. Diet pills may make heart problems worse.  Follow diet instructions as told by your health care provider.  Exercise regularly as told by your health care provider.  Keep all follow-up visits as told by your health care provider. This is important. How is this prevented?  Avoid drinking beverages that contain caffeine or alcohol.  Avoid certain medicines, especially medicines that are used for breathing problems.  Avoid certain herbs and herbal medicines, such as those that contain ephedra or ginseng.  Do not use illegal drugs, such as cocaine and amphetamines.  Do not smoke.  Manage your high blood pressure. Contact a health care provider if:  You notice a change in the rate, rhythm, or strength of your heartbeat.  You are taking an anticoagulant and you notice increased bruising.  You tire more easily when you exercise or exert yourself. Get help right away if:  You have chest pain, abdominal pain, sweating, or  weakness.  You feel nauseous.  You notice blood in your vomit, bowel movement, or urine.  You have shortness of breath.  You suddenly have swollen feet and ankles.  You feel dizzy.  You have sudden weakness or numbness of the face, arm, or leg, especially on one side of the body.  You have trouble speaking, trouble understanding, or both (aphasia).  Your face or your eyelid droops on one side. These symptoms may represent a serious problem that is an emergency. Do not wait to see if the symptoms will go away. Get medical help right away. Call your local emergency services (911 in the [place] Do not drive yourself to the hospital. This information is not intended to replace advice given to you by your health care provider. Make sure you discuss any questions you have with your health care provider. Document Released: 06/16/2005 Document Revised: 10/24/2015 Document Reviewed: 10/11/2014 Elsevier Interactive Patient Education  2018 [employer] for Adults  A healthy lifestyle and preventive care can promote health and wellness. Preventive health guidelines for men include the following key practices:  A routine yearly physical is a good way to check with your health care provider about your health and preventative screening. It is a chance to share any concerns and updates on your health and to  receive a thorough exam.  Visit your dentist for a routine exam and preventative care every 6 months. Brush your teeth twice a day and floss once a day. Good oral hygiene prevents tooth decay and gum disease.  The frequency of eye exams is based on your age, health, family medical history, use of contact lenses, and other factors. Follow your health care provider's recommendations for frequency of eye exams.  Eat a healthy diet. Foods such as vegetables, fruits, whole grains, low-fat dairy products, and lean protein foods contain the nutrients you need without too many  calories. Decrease your intake of foods high in solid fats, added sugars, and salt. Eat the right amount of calories for you. Get information about a proper diet from your health care provider, if necessary.  Regular physical exercise is one of the most important things you can do for your health. Most adults should get at least 150 minutes of moderate-intensity exercise (any activity that increases your heart rate and causes you to sweat) each week. In addition, most adults need muscle-strengthening exercises on 2 or more days a week.  Maintain a healthy weight. The body mass index (BMI) is a screening tool to identify possible weight problems. It provides an estimate of body fat based on height and weight. Your health care provider can find your BMI and can help you achieve or maintain a healthy weight. For adults 20 years and older:  A BMI below 18.5 is considered underweight.  A BMI of 18.5 to 24.9 is normal.  A BMI of 25 to 29.9 is considered overweight.  A BMI of 30 and above is considered obese.  Maintain normal blood lipids and cholesterol levels by exercising and minimizing your intake of saturated fat. Eat a balanced diet with plenty of fruit and vegetables. Blood tests for lipids and cholesterol should begin at age 4 and be repeated every 5 years. If your lipid or cholesterol levels are high, you are over 50, or you are at high risk for heart disease, you may need your cholesterol levels checked more frequently. Ongoing high lipid and cholesterol levels should be treated with medicines if diet and exercise are not working.  If you smoke, find out from your health care provider how to quit. If you do not use tobacco, do not start.  Lung cancer screening is recommended for adults aged 39-80 years who are at high risk for developing lung cancer because of a history of smoking. A yearly low-dose CT scan of the lungs is recommended for people who have at least a 30-pack-year history of  smoking and are a current smoker or have quit within the past 15 years. A pack year of smoking is smoking an average of 1 pack of cigarettes a day for 1 year (for example: 1 pack a day for 30 years or 2 packs a day for 15 years). Yearly screening should continue until the smoker has stopped smoking for at least 15 years. Yearly screening should be stopped for people who develop a health problem that would prevent them from having lung cancer treatment.  If you choose to drink alcohol, do not have more than 2 drinks per day. One drink is considered to be 12 ounces (355 mL) of beer, 5 ounces (148 mL) of wine, or 1.5 ounces (44 mL) of liquor.  Avoid use of street drugs. Do not share needles with anyone. Ask for help if you need support or instructions about stopping the use of drugs.  High  blood pressure causes heart disease and increases the risk of stroke. Your blood pressure should be checked at least every 1-2 years. Ongoing high blood pressure should be treated with medicines, if weight loss and exercise are not effective.  If you are 12-100 years old, ask your health care provider if you should take aspirin to prevent heart disease.  Diabetes screening involves taking a blood sample to check your fasting blood sugar level. Testing should be considered at a younger age or be carried out more frequently if you are overweight and have at least 1 risk factor for diabetes.  Colorectal cancer can be detected and often prevented. Most routine colorectal cancer screening begins at the age of 37 and continues through age 20. However, your health care provider may recommend screening at an earlier age if you have risk factors for colon cancer. On a yearly basis, your health care provider may provide home test kits to check for hidden blood in the stool. Use of a small camera at the end of a tube to directly examine the colon (sigmoidoscopy or colonoscopy) can detect the earliest forms of colorectal cancer. Talk  to your health care provider about this at age 51, when routine screening begins. Direct exam of the colon should be repeated every 5-10 years through age 69, unless early forms of precancerous polyps or small growths are found.  Hepatitis C blood testing is recommended for all people born from [birth year] through 1965 and any individual with known risks for hepatitis C.  Screening for abdominal aortic aneurysm (AAA)  by ultrasound is recommended for people who have history of high blood pressure or who are current or former smokers.  Healthy men should  receive prostate-specific antigen (PSA) blood tests as part of routine cancer screening. Talk with your health care provider about prostate cancer screening.  Testicular cancer screening is  recommended for adult males. Screening includes self-exam, a health care provider exam, and other screening tests. Consult with your health care provider about any symptoms you have or any concerns you have about testicular cancer.  Use sunscreen. Apply sunscreen liberally and repeatedly throughout the day. You should seek shade when your shadow is shorter than you. Protect yourself by wearing long sleeves, pants, a wide-brimmed hat, and sunglasses year round, whenever you are outdoors.  Once a month, do a whole-body skin exam, using a mirror to look at the skin on your back. Tell your health care provider about new moles, moles that have irregular borders, moles that are larger than a pencil eraser, or moles that have changed in shape or color.  Stay current with required vaccines (immunizations).  Influenza vaccine. All adults should be immunized every year.  Tetanus, diphtheria, and acellular pertussis (Td, Tdap) vaccine. An adult who has not previously received Tdap or who does not know his vaccine status should receive 1 dose of Tdap. This initial dose should be followed by tetanus and diphtheria toxoids (Td) booster doses every 10 years. Adults with an unknown  or incomplete history of completing a 3-dose immunization series with Td-containing vaccines should begin or complete a primary immunization series including a Tdap dose. Adults should receive a Td booster every 10 years.  Zoster vaccine. One dose is recommended for adults aged 2 years or older unless certain conditions are present.    PREVNAR - Pneumococcal 13-valent conjugate (PCV13) vaccine. When indicated, a person who is uncertain of his immunization history and has no record of immunization should receive the PCV13 vaccine.  An adult aged 46 years or older who has certain medical conditions and has not been previously immunized should receive 1 dose of PCV13 vaccine. This PCV13 should be followed with a dose of pneumococcal polysaccharide (PPSV23) vaccine. The PPSV23 vaccine dose should be obtained 1 or more year(s)after the dose of PCV13 vaccine. An adult aged 45 years or older who has certain medical conditions and previously received 1 or more doses of PPSV23 vaccine should receive 1 dose of PCV13. The PCV13 vaccine dose should be obtained 1 or more years after the last PPSV23 vaccine dose.    PNEUMOVAX - Pneumococcal polysaccharide (PPSV23) vaccine. When PCV13 is also indicated, PCV13 should be obtained first. All adults aged 47 years and older should be immunized. An adult younger than age 59 years who has certain medical conditions should be immunized. Any person who resides in a nursing home or long-term care facility should be immunized. An adult smoker should be immunized. People with an immunocompromised condition and certain other conditions should receive both PCV13 and PPSV23 vaccines. People with human immunodeficiency virus (HIV) infection should be immunized as soon as possible after diagnosis. Immunization during chemotherapy or radiation therapy should be avoided. Routine use of PPSV23 vaccine is not recommended for American Indians, [place] Natives, or people younger than 65 years  unless there are medical conditions that require PPSV23 vaccine. When indicated, people who have unknown immunization and have no record of immunization should receive PPSV23 vaccine. One-time revaccination 5 years after the first dose of PPSV23 is recommended for people aged 19-64 years who have chronic kidney failure, nephrotic syndrome, asplenia, or immunocompromised conditions. People who received 1-2 doses of PPSV23 before age 74 years should receive another dose of PPSV23 vaccine at age 73 years or later if at least 5 years have passed since the previous dose. Doses of PPSV23 are not needed for people immunized with PPSV23 at or after age 33 years.    Hepatitis A vaccine. Adults who wish to be protected from this disease, have certain high-risk conditions, work with hepatitis A-infected animals, work in hepatitis A research labs, or travel to or work in countries with a high rate of hepatitis A should be immunized. Adults who were previously unvaccinated and who anticipate close contact with an international adoptee during the first 60 days after arrival in the [place] from a country with a high rate of hepatitis A should be immunized.    Hepatitis B vaccine. Adults should be immunized if they wish to be protected from this disease, have certain high-risk conditions, may be exposed to blood or other infectious body fluids, are household contacts or sex partners of hepatitis B positive people, are clients or workers in certain care facilities, or travel to or work in countries with a high rate of hepatitis B.   Preventive Service / Frequency   Ages 91 and over  Blood pressure check.  Lipid and cholesterol check.  Lung cancer screening. / Every year if you are aged 50-80 years and have a 30-pack-year history of smoking and currently smoke or have quit within the past 15 years. Yearly screening is stopped once you have quit smoking for at least 15 years or develop a health problem that  would prevent you from having lung cancer treatment.  Fecal occult blood test (FOBT) of stool. You may not have to do this test if you get a colonoscopy every 10 years.  Flexible sigmoidoscopy** or colonoscopy.** / Every 5 years for a flexible sigmoidoscopy  or every 10 years for a colonoscopy beginning at age 67 and continuing until age 35.  Hepatitis C blood test.** / For all people born from [birth year] through 1964 and any individual with known risks for hepatitis C.  Abdominal aortic aneurysm (AAA) screening./ Screening current or former smokers or have Hypertension.  Skin self-exam. / Monthly.  Influenza vaccine. / Every year.  Tetanus, diphtheria, and acellular pertussis (Tdap/Td) vaccine.** / 1 dose of Td every 10 years.   Zoster vaccine.** / 1 dose for adults aged 3 years or older.         Pneumococcal 13-valent conjugate (PCV13) vaccine.    Pneumococcal polysaccharide (PPSV23) vaccine.     Hepatitis A vaccine.** / Consult your health care provider.  Hepatitis B vaccine.** / Consult your health care provider. Screening for abdominal aortic aneurysm (AAA)  by ultrasound is recommended for people who have history of high blood pressure or who are current or former smokers. ++++++++++ Recommend Adult Low Dose Aspirin or  coated  Aspirin 81 mg daily  To reduce risk of Colon Cancer 20 %,  Skin Cancer 26 % ,  Malignant Melanoma 46%  and  Pancreatic cancer 60% ++++++++++++++++++++++ Vitamin D goal  is between 70-100.  Please make sure that you are taking your Vitamin D as directed.  It is very important as a natural anti-inflammatory  helping hair, skin, and nails, as well as reducing stroke and heart attack risk.  It helps your bones and helps with mood. It also decreases numerous cancer risks so please take it as directed.  Low Vit D is associated with a 200-300% higher risk for CANCER  and 200-300% higher risk for HEART   ATTACK  &  STROKE.    .....................................Marland Kitchen It is also associated with higher death rate at younger ages,  autoimmune diseases like Rheumatoid arthritis, Lupus, Multiple Sclerosis.    Also many other serious conditions, like depression, Alzheimer's Dementia, infertility, muscle aches, fatigue, fibromyalgia - just to name a few. ++++++++++++++++++++++ Recommend the book "The END of DIETING" by Dr Excell Seltzer  & the book "The END of DIABETES " by Dr Excell Seltzer At [email or web address] - get book & Audio CD's    Being diabetic has a  300% increased risk for heart attack, stroke, cancer, and alzheimer- type vascular dementia. It is very important that you work harder with diet by avoiding all foods that are white. Avoid white rice (brown & wild rice is OK), white potatoes (sweetpotatoes in moderation is OK), White bread or wheat bread or anything made out of white flour like bagels, donuts, rolls, buns, biscuits, cakes, pastries, cookies, pizza crust, and pasta (made from white flour & egg whites) - vegetarian pasta or spinach or wheat pasta is OK. Multigrain breads like Arnold's or Pepper[employer], or multigrain sandwich thins or flatbreads.  Diet, exercise and weight loss can reverse and cure diabetes in the early stages.  Diet, exercise and weight loss is very important in the control and prevention of complications of diabetes which affects every system in your body, ie. Brain - dementia/stroke, eyes - glaucoma/blindness, heart - heart attack/heart failure, kidneys - dialysis, stomach - gastric paralysis, intestines - malabsorption, nerves - severe painful neuritis, circulation - gangrene & loss of a leg(s), and finally cancer and Alzheimers.    I recommend avoid fried & greasy foods,  sweets/candy, white rice (brown or wild rice or Quinoa is OK), white potatoes (sweet potatoes are OK) - anything made from white flour -  bagels, doughnuts, rolls, buns, biscuits,white and wheat breads, pizza crust and traditional  pasta made of white flour & egg white(vegetarian pasta or spinach or wheat pasta is OK).  Multi-grain bread is OK - like multi-grain flat bread or sandwich thins. Avoid alcohol in excess. Exercise is also important.    Eat all the vegetables you want - avoid meat, especially red meat and dairy - especially cheese.  Cheese is the most concentrated form of trans-fats which is the worst thing to clog up our arteries. Veggie cheese is OK which can be found in the fresh produce section at [employer] or [employer] or [employer]  ++++++++++++++++++++++ DASH Eating Plan  DASH stands for "Dietary Approaches to Stop Hypertension."   The DASH eating plan is a healthy eating plan that has been shown to reduce high blood pressure (hypertension). Additional health benefits may include reducing the risk of type 2 diabetes mellitus, heart disease, and stroke. The DASH eating plan may also help with weight loss. WHAT DO I NEED TO KNOW ABOUT THE DASH EATING PLAN? For the DASH eating plan, you will follow these general guidelines:  Choose foods with a percent daily value for sodium of less than 5% (as listed on the food label).  Use salt-free seasonings or herbs instead of table salt or sea salt.  Check with your health care provider or pharmacist before using salt substitutes.  Eat lower-sodium products, often labeled as "lower sodium" or "no salt added."  Eat fresh foods.  Eat more vegetables, fruits, and low-fat dairy products.  Choose whole grains. Look for the word "whole" as the first word in the ingredient list.  Choose fish   Limit sweets, desserts, sugars, and sugary drinks.  Choose heart-healthy fats.  Eat veggie cheese   Eat more home-cooked food and less restaurant, buffet, and fast food.  Limit fried foods.  Cook foods using methods other than frying.  Limit canned vegetables. If you do use them, rinse them well to decrease the sodium.  When eating at a restaurant, ask that  your food be prepared with less salt, or no salt if possible.                      WHAT FOODS CAN I EAT? Read Dr Fara Olden Fuhrman's books on The End of Dieting & The End of Diabetes  Grains Whole grain or whole wheat bread. Brown rice. Whole grain or whole wheat pasta. Quinoa, bulgur, and whole grain cereals. Low-sodium cereals. Corn or whole wheat flour tortillas. Whole grain cornbread. Whole grain crackers. Low-sodium crackers.  Vegetables Fresh or frozen vegetables (raw, steamed, roasted, or grilled). Low-sodium or reduced-sodium tomato and vegetable juices. Low-sodium or reduced-sodium tomato sauce and paste. Low-sodium or reduced-sodium canned vegetables.   Fruits All fresh, canned (in natural juice), or frozen fruits.  Protein Products  All fish and seafood.  Dried beans, peas, or lentils. Unsalted nuts and seeds. Unsalted canned beans.  Dairy Low-fat dairy products, such as skim or 1% milk, 2% or reduced-fat cheeses, low-fat ricotta or cottage cheese, or plain low-fat yogurt. Low-sodium or reduced-sodium cheeses.  Fats and Oils Tub margarines without trans fats. Light or reduced-fat mayonnaise and salad dressings (reduced sodium). Avocado. Safflower, olive, or canola oils. Natural peanut or almond butter.  Other Unsalted popcorn and pretzels. The items listed above may not be a complete list of recommended foods or beverages. Contact your dietitian for more options.  ++++++++++++++++++++  WHAT FOODS ARE NOT RECOMMENDED? Grains/ White  flour or wheat flour White bread. White pasta. White rice. Refined cornbread. Bagels and croissants. Crackers that contain trans fat.  Vegetables  Creamed or fried vegetables. Vegetables in a . Regular canned vegetables. Regular canned tomato sauce and paste. Regular tomato and vegetable juices.  Fruits Dried fruits. Canned fruit in light or heavy syrup. Fruit juice.  Meat and Other Protein Products Meat in general - RED meat & White meat.   Fatty cuts of meat. Ribs, chicken wings, all processed meats as bacon, sausage, bologna, salami, fatback, hot dogs, bratwurst and packaged luncheon meats.  Dairy Whole or 2% milk, cream, half-and-half, and cream cheese. Whole-fat or sweetened yogurt. Full-fat cheeses or blue cheese. Non-dairy creamers and whipped toppings. Processed cheese, cheese spreads, or cheese curds.  Condiments Onion and garlic salt, seasoned salt, table salt, and sea salt. Canned and packaged gravies. Worcestershire sauce. Tartar sauce. Barbecue sauce. Teriyaki sauce. Soy sauce, including reduced sodium. Steak sauce. Fish sauce. Oyster sauce. Cocktail sauce. Horseradish. Ketchup and mustard. Meat flavorings and tenderizers. Bouillon cubes. Hot sauce. Tabasco sauce. Marinades. Taco seasonings. Relishes.  Fats and Oils Butter, stick margarine, lard, shortening and bacon fat. Coconut, palm kernel, or palm oils. Regular salad dressings.  Pickles and olives. Salted popcorn and pretzels.  The items listed above may not be a complete list of foods and beverages to avoid.

## 2018-02-23 NOTE — Progress Notes (Addendum)
[facility]   [name], M.D.     Uvaldo Bristle. Silverio Lay, P.A.-C Liane Comber, Mor[place]                [address] [place], [place] [address] Telephone [phone] Telefax [phone]  Comprehensive Evaluation & Examination     This very nice 67 y.o. [name]  presents for a comprehensive evaluation and management of multiple medical co-morbidities.  Patient has been followed for HTN, ASPVD, hx/o pAfib s/p RFA, HLD, Prediabetes and Vitamin D Deficiency.     Patient has long hx/o labile HTN. In Mar 2011, he was CV for pAfib and a 2sd episode of Afib/AFlutt in Nov 2011, he underwent EPS / RFA by Dr Cristopher Peru. Today patient's EKG is back in Afib. Today's BP is at goal - 122/84.  Patient also has hx/o severe LE PVD and in Oct 2018 Dr [name] PCA/Stenting & arthrotomies of his Rt LE.  Patient continues to smoke. Patient denies any cardiac symptoms as chest pain, palpitations, shortness of breath, dizziness or ankle swelling.     Patient's hyperlipidemia is controlled with diet and medications. Patient denies myalgias or other medication SE's. Last lipids were at goal: Lab Results  Component Value Date   CHOL 132 08/10/2017   HDL 44 08/10/2017   LDLCALC 67 08/10/2017   TRIG 107 08/10/2017   CHOLHDL 3.0 08/10/2017      Patient has long standing  hx/o prediabetes and patient denies reactive hypoglycemic symptoms, visual blurring, diabetic polys or paresthesias. Last A1c was near goal: Lab Results  Component Value Date   HGBA1C 5.7 (H) 02/05/2017       Finally, patient has history of Vitamin D Deficiency ("28"/2017) and last vitamin D was  Lab Results  Component Value Date   VD25OH 23 (L) 02/05/2017   Current Outpatient Medications on File Prior to Visit  Medication Sig  . ALPRAZolam 1 MG  Take 0.5 mg  at bedtime as needed for anxiety.   . Atorvastatin 80 MG Take 1 tablet daily at 6 PM.  .  clopidogrel  75 MG Take 1 tablet  daily with breakfast.  . aspirin EC 81 MG EC  Take 1 tablet daily. (Patient not taking: Reported on 02/23/2018)   Allergies  Allergen Reactions  . Effexor [Venlafaxine]     Feels bad  . Wellbutrin [Bupropion]     sleepy  . Zoloft [Sertraline Hcl]     Felt bad   Past Medical History:  Diagnosis Date  . Anxiety   . Blood transfusion without reported diagnosis   . Cataract   . Depression   . Emphysema of lung ([place])   . Heart murmur    Health Maintenance  Topic Date Due  . Hepatitis C Screening  03/28/1951  . COLONOSCOPY  08/20/2001  . INFLUENZA VACCINE  01/28/2018  . PNA vac Low Risk Adult (2 of 2 - PPSV23) 02/05/2018  . TETANUS/TDAP  07/01/2023   Immunization History  Administered Date(s) Administered  . Influenza Whole 04/30/2010  . Influenza, High Dose Seasonal PF 03/20/2017  . Pneumococcal Conjugate-13 02/05/2017  . Pneumococcal Polysaccharide-23 04/30/2010  . Td 06/30/2013   Last Colon - Has declined Colonoscopy in the past & today is agreeable to do ColoGard  Past Surgical History:  Procedure Laterality Date  . ATRIAL FIBRILLATION ABLATION    . EYE SURGERY    .  FRACTURE SURGERY    . LOWER EXTREMITY INTERVENTION N/A 04/27/2017   Procedure: LOWER EXTREMITY INTERVENTION;  Surgeon: Lorretta Harp, MD;  Location: [facility];  Service: Cardiovascular;  Laterality: N/A;  . PERIPHERAL VASCULAR ATHERECTOMY Right 04/27/2017   Procedure: PERIPHERAL VASCULAR ATHERECTOMY;  Surgeon: Lorretta Harp, MD;  Location: [facility];  Service: Cardiovascular;  Laterality: Right;  Iliac  . PERIPHERAL VASCULAR INTERVENTION Right 04/27/2017   Procedure: PERIPHERAL VASCULAR INTERVENTION;  Surgeon: Lorretta Harp, MD;  Location: [facility];  Service: Cardiovascular;  Laterality: Right;  Iliac   Family History  Problem Relation Age of Onset  . Cancer Mother   . Cancer Father     ROS Constitutional: Denies fever, chills,  weight loss/gain, headaches, insomnia,  night sweats or change in appetite. Does c/o fatigue. Eyes: Denies redness, blurred vision, diplopia, discharge, itchy or watery eyes.  ENT: Denies discharge, congestion, post nasal drip, epistaxis, sore throat, earache, hearing loss, dental pain, Tinnitus, Vertigo, Sinus pain or snoring.  Cardio: Denies chest pain, palpitations, irregular heartbeat, syncope, dyspnea, diaphoresis, orthopnea, PND, claudication or edema Respiratory: denies cough, dyspnea, DOE, pleurisy, hoarseness, laryngitis or wheezing.  Gastrointestinal: Denies dysphagia, heartburn, reflux, water brash, pain, cramps, nausea, vomiting, bloating, diarrhea, constipation, hematemesis, melena, hematochezia, jaundice or hemorrhoids Genitourinary: Denies dysuria, frequency, discharge, hematuria or flank pain. Has urgency, nocturia x 2-3 & occasional hesitancy. Musculoskeletal: Denies arthralgia, myalgia, stiffness, Jt. Swelling, pain, limp or strain/sprain. Denies Falls. Skin: Denies puritis, rash, hives, warts, acne, eczema or change in skin lesion Neuro: No weakness, tremor, incoordination, spasms, paresthesia or pain Psychiatric: Denies confusion, memory loss or sensory loss. Denies Depression. Endocrine: Denies change in weight, skin, hair change, nocturia, and paresthesia, diabetic polys, visual blurring or hyper / hypo glycemic episodes.  Heme/Lymph: No excessive bleeding, bruising or enlarged lymph nodes.  Physical Exam  BP 122/84   Pulse 83   Temp 98.3 F (36.8 C)   Resp 12   Ht 6' 1.5" (1.867 m)   Wt 202 lb 3.2 oz (91.7 kg)   SpO2 97%   BMI 26.32 kg/m   General Appearance: Well nourished and well groomed and in no apparent distress.  Eyes: PERRLA, EOMs, conjunctiva no swelling or erythema, normal fundi and vessels. Sinuses: No frontal/maxillary tenderness ENT/Mouth: EACs patent / TMs  nl. Nares clear without erythema, swelling, mucoid exudates. Oral hygiene is good. No  erythema, swelling, or exudate. Tongue normal, non-obstructing. Tonsils not swollen or erythematous. Hearing normal.  Neck: Supple, thyroid not palpable. No bruits, nodes or JVD. Respiratory: Respiratory effort normal.  BS equal and clear bilateral without rales, rhonci, wheezing or stridor. Cardio: Heart sounds are distant with sl irregular rate and rhythm and no murmurs. Peripheral pulses not palpable and without edema. No aortic or femoral bruits. Chest: symmetric with normal excursions and percussion.  Abdomen: Soft, with Nl bowel sounds. Nontender, no guarding, rebound, hernias, masses, or organomegaly.  Lymphatics: Non tender without lymphadenopathy.  Genitourinary:  DRE - prostate nl for age - smooth & firm w/o nodules. Musculoskeletal: Full ROM all peripheral extremities, joint stability, 5/5 strength, and normal gait. Skin: Warm and dry without rashes, lesions, cyanosis, clubbing or  ecchymosis.  Neuro: Cranial nerves intact, reflexes equal bilaterally. Normal muscle tone, no cerebellar symptoms. Sensation intact.  Pysch: Alert and oriented X 3 with normal affect, insight and judgment appropriate.   Assessment and Plan  1. Labile hypertension  - EKG 12-Lead - [place], RETROPERITNL ABD,  LTD - Urinalysis,  Routine w reflex microscopic - Microalbumin / creatinine urine ratio - CBC with Differential/Platelet - COMPLETE METABOLIC PANEL WITH GFR - Magnesium - TSH  2. Hyperlipidemia, mixed  - EKG 12-Lead - [place], RETROPERITNL ABD,  LTD - Lipid panel - TSH  3. Abnormal glucose  - EKG 12-Lead - [place], RETROPERITNL ABD,  LTD - Hemoglobin A1c - Insulin, random  4. Vitamin D deficiency  - VITAMIN D 25 Hydroxy  5. Prediabetes  - EKG 12-Lead - [place], RETROPERITNL ABD,  LTD - Hemoglobin A1c - Insulin, random  6. Paroxysmal atrial fibrillation (HCC)  - EKG 1- shows Afib today - TSH  - Start 30 day starter  Xarelto 20 mg   - Refer back to Dr Lovena Le   7. PVD (peripheral vascular  disease) (HCC)  - Lipid panel  8. Screening for colorectal cancer  - POC Hemoccult Bld/Stl   9. BPH with obstruction/lower urinary tract symptoms  - PSA  10. Prostate cancer screening  - PSA  11. Screening for ischemic heart disease  - EKG 12-Lead  12. FHx: heart disease  - EKG 12-Lead - [place], RETROPERITNL ABD,  LTD  13. Smoker  - EKG 12-Lead - [place], RETROPERITNL ABD,  LTD  14. Screening for AAA (aortic abdominal aneurysm)  - [place], RETROPERITNL ABD,  LTD  15. Medication management  - Urinalysis, Routine w reflex microscopic - Microalbumin / creatinine urine ratio - CBC with Differential/Platelet - COMPLETE METABOLIC PANEL WITH GFR - Magnesium - Lipid panel - TSH - Hemoglobin A1c - Insulin, random - VITAMIN D 25 Hydroxyl       Patient was counseled in prudent diet, weight control to achieve/maintain BMI less than 25, BP monitoring, regular exercise and medications as discussed. Discouraged smoking & discussed cessation techniques.  Discussed med effects and SE's. Routine screening labs and tests as requested with regular follow-up as recommended. Over 40 minutes of exam, counseling, chart review and high complex critical decision making was performed

## 2018-02-23 NOTE — Addendum Note (Signed)
Addended by: [name] on: 02/23/2018 03:14 PM   Modules accepted: Orders

## 2018-02-24 LAB — LIPID PANEL
Cholesterol: 197 mg/dL (ref <200)
HDL: 38 mg/dL — ABNORMAL LOW (ref >40)
LDL CHOLESTEROL (CALC): 127 mg/dL — AB
NON-HDL CHOLESTEROL (CALC): 159 mg/dL — AB (ref <130)
TRIGLYCERIDES: 198 mg/dL — AB (ref <150)
Total CHOL/HDL Ratio: 5.2 (calc) — ABNORMAL HIGH (ref <5.0)

## 2018-02-24 LAB — TSH: TSH: 1.51 m[IU]/L (ref 0.40–4.50)

## 2018-02-24 LAB — COMPLETE METABOLIC PANEL WITH GFR
AG Ratio: 1.7 (calc) (ref 1.0–2.5)
ALBUMIN MSPROF: 4.3 g/dL (ref 3.6–5.1)
ALT: 16 U/L (ref 9–46)
AST: 18 U/L (ref 10–35)
Alkaline phosphatase (APISO): 69 U/L (ref 40–115)
BILIRUBIN TOTAL: 0.7 mg/dL (ref 0.2–1.2)
BUN: 13 mg/dL (ref 7–25)
CHLORIDE: 106 mmol/L (ref 98–110)
CO2: 28 mmol/L (ref 20–32)
CREATININE: 1.1 mg/dL (ref 0.70–1.25)
Calcium: 9.6 mg/dL (ref 8.6–10.3)
GFR, EST AFRICAN AMERICAN: 81 mL/min/{1.73_m2} (ref >=60)
GFR, Est Non African American: 70 mL/min/{1.73_m2} (ref >=60)
GLUCOSE: 82 mg/dL (ref 65–99)
Globulin: 2.6 g/dL (calc) (ref 1.9–3.7)
Potassium: 5.1 mmol/L (ref 3.5–5.3)
SODIUM: 142 mmol/L (ref 135–146)
TOTAL PROTEIN: 6.9 g/dL (ref 6.1–8.1)

## 2018-02-24 LAB — URINALYSIS, ROUTINE W REFLEX MICROSCOPIC
BILIRUBIN URINE: NEGATIVE
Glucose, UA: NEGATIVE
Hgb urine dipstick: NEGATIVE
KETONES UR: NEGATIVE
Leukocytes, UA: NEGATIVE
NITRITE: NEGATIVE
PROTEIN: NEGATIVE
SPECIFIC GRAVITY, URINE: 1.011 (ref 1.001–1.03)
pH: 5.5 (ref 5.0–8.0)

## 2018-02-24 LAB — CBC WITH DIFFERENTIAL/PLATELET
BASOS ABS: 88 {cells}/uL (ref 0–200)
Basophils Relative: 1.1 %
EOS ABS: 416 {cells}/uL (ref 15–500)
Eosinophils Relative: 5.2 %
HEMATOCRIT: 42.5 % (ref 38.5–50.0)
HEMOGLOBIN: 14.3 g/dL (ref 13.2–17.1)
LYMPHS ABS: 2264 {cells}/uL (ref 850–3900)
MCH: 30.3 pg (ref 27.0–33.0)
MCHC: 33.6 g/dL (ref 32.0–36.0)
MCV: 90 fL (ref 80.0–100.0)
MONOS PCT: 8.9 %
MPV: 11.3 fL (ref 7.5–12.5)
NEUTROS ABS: 4520 {cells}/uL (ref 1500–7800)
Neutrophils Relative %: 56.5 %
Platelets: 177 10*3/uL (ref 140–400)
RBC: 4.72 10*6/uL (ref 4.20–5.80)
RDW: 13.6 % (ref 11.0–15.0)
Total Lymphocyte: 28.3 %
WBC: 8 10*3/uL (ref 3.8–10.8)
WBCMIX: 712 {cells}/uL (ref 200–950)

## 2018-02-24 LAB — HEMOGLOBIN A1C
EAG (MMOL/L): 7 (calc)
Hgb A1c MFr Bld: 6 % of total Hgb — ABNORMAL HIGH (ref <5.7)
Mean Plasma Glucose: 126 (calc)

## 2018-02-24 LAB — MICROALBUMIN / CREATININE URINE RATIO: Creatinine, Urine: 62 mg/dL (ref 20–320)

## 2018-02-24 LAB — MAGNESIUM: Magnesium: 2.3 mg/dL (ref 1.5–2.5)

## 2018-02-24 LAB — VITAMIN D 25 HYDROXY (VIT D DEFICIENCY, FRACTURES): Vit D, 25-Hydroxy: 23 ng/mL — ABNORMAL LOW (ref 30–100)

## 2018-02-24 LAB — INSULIN, RANDOM

## 2018-02-24 LAB — PSA: PSA: 0.9 ng/mL (ref <=4.0)

## 2018-03-08 ENCOUNTER — Encounter: Payer: Self-pay | Admitting: Cardiology

## 2018-03-09 ENCOUNTER — Encounter: Payer: Self-pay | Admitting: Cardiology

## 2018-03-09 ENCOUNTER — Ambulatory Visit (INDEPENDENT_AMBULATORY_CARE_PROVIDER_SITE_OTHER): Payer: Medicare Other | Admitting: Cardiology

## 2018-03-09 VITALS — BP 126/70 | HR 98 | Ht 73.5 in | Wt 202.0 lb

## 2018-03-09 DIAGNOSIS — I10 Essential (primary) hypertension: Secondary | ICD-10-CM

## 2018-03-09 DIAGNOSIS — I4819 Other persistent atrial fibrillation: Secondary | ICD-10-CM

## 2018-03-09 DIAGNOSIS — I481 Persistent atrial fibrillation: Secondary | ICD-10-CM

## 2018-03-09 DIAGNOSIS — E785 Hyperlipidemia, unspecified: Secondary | ICD-10-CM

## 2018-03-09 DIAGNOSIS — I739 Peripheral vascular disease, unspecified: Secondary | ICD-10-CM | POA: Diagnosis not present

## 2018-03-09 NOTE — Patient Instructions (Addendum)
Medication Instructions:  Your physician has recommended you make the following change in your medication:  1. STOP Aspirin  * If you need a refill on your cardiac medications before your next appointment, please call your pharmacy.   Labwork: None ordered  Testing/Procedures: Your physician has requested that you have an echocardiogram. Echocardiography is a painless test that uses sound waves to create images of your heart. It provides your doctor with information about the size and shape of your heart and how well your heart's chambers and valves are working. This procedure takes approximately one hour. There are no restrictions for this procedure.  Follow-Up: Your physician recommends that you schedule a follow-up appointment in: 3 months with Dr. Curt Bears.   *Please note that any paperwork needing to be filled out by the provider will need to be addressed at the front desk prior to seeing the provider. Please note that any FMLA, disability or other documents regarding health condition is subject to a $25.00 charge that must be received prior to completion of paperwork in the form of a money order or check.  Thank you for choosing [facility]!!   Trinidad Curet, RN [phone]  Any Other Special Instructions Will Be Listed Below (If Applicable).  Please check with your insurance company for the cost of [place].  Please call the nurse Venida Jarvis) and let her know if you can afford this.

## 2018-03-09 NOTE — Progress Notes (Signed)
Electrophysiology Office Note   Date:  03/09/2018   ID:  Bradley Ferguson, DOB 1950-12-30, MRN [id number]  PCP:  [name], MD  Cardiologist:  [name] Primary Electrophysiologist:  Constance Haw, MD    No chief complaint on file.    History of Present Illness: Bradley Ferguson is a 67 y.o. male who is being seen today for the evaluation of atrial fibrillation at the request of [name], MD. Presenting today for electrophysiology evaluation.  He has a history of peripheral artery disease, hyperlipidemia, 45-pack-year tobacco history.  He also has COPD.  He had an ablation in 2011 feels was due to atrial fibrillation.  He has been weak and fatigued over the last few weeks.  He was recently seen by his primary physician who found him to be in atrial fibrillation.  He otherwise feels well.  He continues to have some lower extremity discomfort but this is improved since his vascular intervention.   Today, he denies symptoms of palpitations, chest pain, shortness of breath, orthopnea, PND, lower extremity edema, claudication, dizziness, presyncope, syncope, bleeding, or neurologic sequela. The patient is tolerating medications without difficulties.    Past Medical History:  Diagnosis Date  . Anxiety   . Blood transfusion without reported diagnosis   . Cataract   . Depression   . Emphysema of lung ([place])   . Heart murmur    Past Surgical History:  Procedure Laterality Date  . ATRIAL FIBRILLATION ABLATION    . EYE SURGERY    . FRACTURE SURGERY    . LOWER EXTREMITY INTERVENTION N/A 04/27/2017   Procedure: LOWER EXTREMITY INTERVENTION;  Surgeon: Lorretta Harp, MD;  Location: [facility];  Service: Cardiovascular;  Laterality: N/A;  . PERIPHERAL VASCULAR ATHERECTOMY Right 04/27/2017   Procedure: PERIPHERAL VASCULAR ATHERECTOMY;  Surgeon: Lorretta Harp, MD;  Location: [facility];  Service: Cardiovascular;  Laterality: Right;  Iliac  . PERIPHERAL  VASCULAR INTERVENTION Right 04/27/2017   Procedure: PERIPHERAL VASCULAR INTERVENTION;  Surgeon: Lorretta Harp, MD;  Location: [facility];  Service: Cardiovascular;  Laterality: Right;  Iliac     Current Outpatient Medications  Medication Sig Dispense Refill  . ALPRAZolam (XANAX) 1 MG tablet Take 1/2-1 tablet 2 - 3 x /day ONLY if needed for Anxiety Attack &  limit to 5 days /week to avoid addiction 90 tablet 0  . atorvastatin (LIPITOR) 80 MG tablet Take 1 tablet daily for Cholesterol 90 tablet 1  . Cholecalciferol (VITAMIN D PO) Take 1 tablet by mouth daily.    . clopidogrel (PLAVIX) 75 MG tablet Take 1 tablet daily for Circulation 90 tablet 1  . rivaroxaban (XARELTO) 20 MG TABS tablet Take 1 tablet daily for Atrial Fibrillation 30 tablet 11   No current facility-administered medications for this visit.     Allergies:   Effexor [venlafaxine]; Wellbutrin [bupropion]; and Zoloft [sertraline hcl]   Social History:  The patient  reports that he has been smoking cigarettes. He has been smoking about 0.75 packs per day. He has never used smokeless tobacco. He reports that he drinks about 4.0 standard drinks of alcohol per week. He reports that he does not use drugs.   Family History:  The patient's family history includes Cancer in his father and mother.    ROS:  Please see the history of present illness.   Otherwise, review of systems is positive for none.   All other systems are reviewed and negative.  PHYSICAL EXAM: VS:  BP 126/70   Pulse 98   Ht 6' 1.5" (1.867 m)   Wt 202 lb (91.6 kg)   BMI 26.29 kg/m  , BMI Body mass index is 26.29 kg/m. GEN: Well nourished, well developed, in no acute distress  HEENT: normal  Neck: no JVD, carotid bruits, or masses Cardiac: iRRR; no murmurs, rubs, or gallops,no edema  Respiratory:  clear to auscultation bilaterally, normal work of breathing GI: soft, nontender, nondistended, + BS MS: no deformity or atrophy  Skin: warm and  dry Neuro:  Strength and sensation are intact Psych: euthymic mood, full affect  EKG:  EKG is ordered today. Personal review of the ekg ordered shows atrial fibrillation, rate 98, left posterior fascicular block  Recent Labs: 02/23/2018: ALT 16; BUN 13; Creat 1.10; Hemoglobin 14.3; Magnesium 2.3; Platelets 177; Potassium 5.1; Sodium 142; TSH 1.51    Lipid Panel     Component Value Date/Time   CHOL 197 02/23/2018 1522   CHOL 132 08/10/2017 1545   TRIG 198 (H) 02/23/2018 1522   HDL 38 (L) 02/23/2018 1522   HDL 44 08/10/2017 1545   CHOLHDL 5.2 (H) 02/23/2018 1522   VLDL 35 (H) 02/05/2017 1234   LDLCALC 127 (H) 02/23/2018 1522     Wt Readings from Last 3 Encounters:  03/09/18 202 lb (91.6 kg)  02/23/18 202 lb 3.2 oz (91.7 kg)  12/01/17 200 lb 3.2 oz (90.8 kg)      Other studies Reviewed: Additional studies/ records that were reviewed today include: Epic notes   ASSESSMENT AND PLAN:  1.  Persistent atrial fibrillation: Currently on Xarelto.  We will stop his aspirin today.  I did talk to him about the option of returning to sinus rhythm.  He has been anticoagulated for 1 week.  He will need 2 more weeks of anticoagulation.  We will repeat an echo as well.  I feel that dofetilide would likely be the best medication for him.  He likely has coronary disease and would prefer to avoid flecainide and propafenone.  Due to his long smoking history would like to avoid amiodarone.  We will have him check on whether or not he would be able to afford the dofetilide.  This patients CHA2DS2-VASc Score and unadjusted Ischemic Stroke Rate (% per year) is equal to 4.8 % stroke rate/year from a score of 4  Above score calculated as 1 point each if present [CHF, HTN, DM, Vascular=MI/PAD/Aortic Plaque, Age if 65-74, or Male] Above score calculated as 2 points each if present [Age > 75, or Stroke/TIA/TE]  2.  Hyperlipidemia: Statin per primary cardiology  3.  Hypertension: Currently well  controlled.  4.  Peripheral arterial disease: Status post angiography 2018 by Dr. [name].  Continue work-up per primary cardiology.    Current medicines are reviewed at length with the patient today.   The patient does not have concerns regarding his medicines.  The following changes were made today:  none  Labs/ tests ordered today include:  Orders Placed This Encounter  Procedures  . EKG 12-Lead  . ECHOCARDIOGRAM COMPLETE   Case discussed with primary cardiology  Disposition:   FU with Will Camnitz 3 months  Signed, [name], MD  03/09/2018 9:08 AM     [facility] [address] [place] [address] [phone] (office) [phone] (fax)

## 2018-03-11 ENCOUNTER — Telehealth: Payer: Self-pay | Admitting: Cardiology

## 2018-03-11 ENCOUNTER — Other Ambulatory Visit: Payer: Self-pay

## 2018-03-11 ENCOUNTER — Ambulatory Visit (HOSPITAL_COMMUNITY): Payer: Medicare Other | Attending: Cardiology

## 2018-03-11 DIAGNOSIS — I071 Rheumatic tricuspid insufficiency: Secondary | ICD-10-CM | POA: Diagnosis not present

## 2018-03-11 DIAGNOSIS — I481 Persistent atrial fibrillation: Secondary | ICD-10-CM | POA: Diagnosis not present

## 2018-03-11 DIAGNOSIS — I4819 Other persistent atrial fibrillation: Secondary | ICD-10-CM

## 2018-03-11 DIAGNOSIS — I119 Hypertensive heart disease without heart failure: Secondary | ICD-10-CM | POA: Insufficient documentation

## 2018-03-11 DIAGNOSIS — E785 Hyperlipidemia, unspecified: Secondary | ICD-10-CM | POA: Insufficient documentation

## 2018-03-11 NOTE — Telephone Encounter (Signed)
New message    Please call regarding the Tikosyn he finished with his echo , he is going to leave , call him at home .  If you cant call him can you have Jeani Hawking call him about the patient assistance

## 2018-03-11 NOTE — Telephone Encounter (Signed)
New message    Patient would like to speak to you about the Tikosyn- he is in the office for his echo. He wants to know how long he has to take the Tikosyn, he doesn't think he qualifies for the assistance program.

## 2018-03-11 NOTE — Telephone Encounter (Signed)
Advised to stop ASA per WC. Pt agreeable.  Pt states that 60 tablets of Tikosyn will cost him $173/mo States that Xarelto is going to cost over $400/mo. Pt aware I will forward to our PA dept who will contact him to discuss assistance options as he cannot afford this.  He and I agreed to follow up after we determine cost options for medications.

## 2018-03-15 ENCOUNTER — Other Ambulatory Visit: Payer: Self-pay

## 2018-03-15 MED ORDER — DOFETILIDE 500 MCG PO CAPS: 500.0000 ug | ORAL_CAPSULE | Freq: Two times a day (BID) | ORAL | 6 refills | Status: DC

## 2018-03-15 NOTE — Telephone Encounter (Addendum)
**Note De-Identified Ivannia Willhelm Obfuscation** I have sent the pts Tikosyn to [facility].  The pt will need to be in the hospital for a Tikosyn load prior to [place] attempting to have the cost lowered on Tikosyn and this is done while the pt is in the hospital.  I called the pt who states that he has no insurance and that he got the cost of [place] and Tikosyn from [employer].  I offered to mail him a Wynetta Emery and [place] pt asst application but the pt states that he was given one while at the office. I have advised him to call Wynetta Emery and Wynetta Emery prior to applying as he has a lot of questions and to see if he has to provide his 2018 proof of income or his out of pocket expense report from his pharmacy. He verbalized understanding and thanked me for calling him.

## 2018-03-15 NOTE — Telephone Encounter (Signed)
Tikosyn 500 mg BID

## 2018-03-15 NOTE — Telephone Encounter (Signed)
**Note De-Identified Bradley Ferguson Obfuscation** Tikosyn is not in the pts med list. Please advise on mg and dose. Thanks.

## 2018-03-25 NOTE — Telephone Encounter (Signed)
Followed up w/ pt. He does not have Tikosyn paperwork - he has Xarelto assistance paperwork. Will forward to [place] to follow up w/ pt about Xarelto assistance.  Will send a staff message to AFib clinic to address Tikosyn w/ pt.

## 2018-03-26 ENCOUNTER — Other Ambulatory Visit (HOSPITAL_COMMUNITY): Payer: Self-pay | Admitting: *Deleted

## 2018-03-26 NOTE — Telephone Encounter (Signed)
I have left a message to discuss tikosyn admission/patient assistance with patient. Will await follow up phone call.

## 2018-03-26 NOTE — Telephone Encounter (Signed)
LMTCB

## 2018-03-29 ENCOUNTER — Ambulatory Visit (INDEPENDENT_AMBULATORY_CARE_PROVIDER_SITE_OTHER): Payer: Medicare Other | Admitting: Internal Medicine

## 2018-03-29 VITALS — BP 122/80 | HR 80 | Temp 97.3°F | Resp 16 | Ht 73.5 in | Wt 198.8 lb

## 2018-03-29 DIAGNOSIS — I480 Paroxysmal atrial fibrillation: Secondary | ICD-10-CM

## 2018-03-29 DIAGNOSIS — Z23 Encounter for immunization: Secondary | ICD-10-CM | POA: Diagnosis not present

## 2018-03-29 NOTE — Progress Notes (Signed)
   Subjective:    Patient ID: Bradley Ferguson, male    DOB: [birth year], 67 y.o.   MRN: [id number]  HPI    This nice 67 yo [name] w/hx/o pAfib s/p ablation in 2011and was seen recently for CPE and was back in Afib and started on Xarelto. Patient has been seen by Dr Curt Bears and is being primed for CV. Patient presented today to discuss his M/C options for best financial coverage of his projected expenses. Dr Camnitz's office staff is assisting in trying to obtain patient assistance.  Medication Sig  . ALPRAZolam (XANAX) 1 MG tablet Take 1/2-1 tablet 2 - 3 x /day ONLY if needed for Anxiety Attack &  limit to 5 days /week to avoid addiction  . atorvastatin (LIPITOR) 80 MG tablet Take 1 tablet daily for Cholesterol  . Cholecalciferol (VITAMIN D PO) Take 1 tablet by mouth daily.  . clopidogrel (PLAVIX) 75 MG tablet Take 1 tablet daily for Circulation  . rivaroxaban (XARELTO) 20 MG TABS tablet Take 1 tablet daily for Atrial Fibrillation   Allergies  Allergen Reactions  . Effexor [Venlafaxine]     Feels bad  . Wellbutrin [Bupropion]     sleepy  . Zoloft [Sertraline Hcl]     Felt bad   Past Medical History:  Diagnosis Date  . Anxiety   . Blood transfusion without reported diagnosis   . Cataract   . Depression   . Emphysema of lung ([place])   . Heart murmur    Review of Systems    10 point systems review negative except as above.    Objective:   Physical Exam  BP 122/80   Pulse 80   Temp (!) 97.3 F (36.3 C)   Resp 16   Ht 6' 1.5" (1.867 m)   Wt 198 lb 12.8 oz (90.2 kg)   BMI 25.87 kg/m   No Formal Exam today     Assessment & Plan:   1. Paroxysmal atrial fibrillation (HCC)  2. Need for prophylactic vaccination and inoculation against influenza  - Flu vaccine HIGH DOSE PF (Fluzone High dose)  3. Need for prophylactic vaccination against Streptococcus pneumoniae (pneumococcus)  - Flu vaccine HIGH DOSE PF (Fluzone High dose)  (No charge OV)     .

## 2018-04-20 NOTE — Telephone Encounter (Signed)
Pt is getting health insurance for next year [employer]). Until then Tikosyn will cost around $170/mo and he is willing to pay until his health insurance starts in Jan.   He understands I will forward to [place], RN in the AFib clinic to call him and discuss best plan of care.   He understands she will arrange admission.

## 2018-04-20 NOTE — Telephone Encounter (Signed)
The pt states that he cannot apply for pt asst with Xarelto due to not have spent 4%v of his yearly income on medications and he does not have proof of his income from 2018. He states that he will pay for Xarelto until his new insurance picks up in January 2020.  Also, he is requesting a call from Dr [employer] nurse before she leaves tonight. I will forward this message to her now.

## 2018-04-22 ENCOUNTER — Telehealth: Payer: Self-pay | Admitting: Pharmacist

## 2018-04-22 NOTE — Telephone Encounter (Signed)
Medication list reviewed in anticipation of upcoming Tikosyn initiation. Patient is not taking any contraindicated or QTc prolonging medications.   Patient is anticoagulated on Xarelto 20mg daily on the appropriate dose. Please ensure that patient has not missed any anticoagulation doses in the 3 weeks prior to Tikosyn initiation.   Patient will need to be counseled to avoid use of Benadryl while on Tikosyn and in the 2-3 days prior to Tikosyn initiation.  

## 2018-04-22 NOTE — Telephone Encounter (Signed)
Spoke with patient - he states through [employer] program dofetilide will cost him 170 a month and patient would like to proceed understanding the cost may change. Pt states his income records "are too difficult to come by" for him to apply for patient assistance. Explained importance of medication adherence with tikosyn. He is signed up for medicare D in 2020 and already knows the estimated cost under the plan. Pt in agreement with hospitalization. Appt made for 10/28.

## 2018-04-27 ENCOUNTER — Other Ambulatory Visit: Payer: Self-pay

## 2018-04-27 ENCOUNTER — Encounter (HOSPITAL_COMMUNITY): Payer: Self-pay | Admitting: Nurse Practitioner

## 2018-04-27 ENCOUNTER — Inpatient Hospital Stay (HOSPITAL_COMMUNITY)
Admission: AD | Admit: 2018-04-27 | Discharge: 2018-04-30 | DRG: 310 | Disposition: A | Discharge status: Home / Self Care | Payer: Medicare Other | Source: Ambulatory Visit | Attending: Cardiology | Admitting: Cardiology

## 2018-04-27 ENCOUNTER — Encounter (HOSPITAL_COMMUNITY): Payer: Self-pay | Admitting: General Practice

## 2018-04-27 ENCOUNTER — Ambulatory Visit (HOSPITAL_COMMUNITY)
Admission: RE | Admit: 2018-04-27 | Discharge: 2018-04-27 | Disposition: A | Discharge status: Home / Self Care | Payer: Medicare Other | Source: Ambulatory Visit | Attending: Nurse Practitioner | Admitting: Nurse Practitioner

## 2018-04-27 VITALS — BP 130/84 | HR 94 | Ht 73.5 in | Wt 198.2 lb

## 2018-04-27 DIAGNOSIS — Z5181 Encounter for therapeutic drug level monitoring: Secondary | ICD-10-CM | POA: Diagnosis not present

## 2018-04-27 DIAGNOSIS — Z888 Allergy status to other drugs, medicaments and biological substances status: Secondary | ICD-10-CM | POA: Diagnosis not present

## 2018-04-27 DIAGNOSIS — I739 Peripheral vascular disease, unspecified: Secondary | ICD-10-CM | POA: Diagnosis present

## 2018-04-27 DIAGNOSIS — I4819 Other persistent atrial fibrillation: Secondary | ICD-10-CM | POA: Diagnosis present

## 2018-04-27 DIAGNOSIS — F17210 Nicotine dependence, cigarettes, uncomplicated: Secondary | ICD-10-CM | POA: Diagnosis present

## 2018-04-27 DIAGNOSIS — E785 Hyperlipidemia, unspecified: Secondary | ICD-10-CM | POA: Diagnosis present

## 2018-04-27 DIAGNOSIS — Z7902 Long term (current) use of antithrombotics/antiplatelets: Secondary | ICD-10-CM

## 2018-04-27 DIAGNOSIS — I10 Essential (primary) hypertension: Secondary | ICD-10-CM | POA: Diagnosis present

## 2018-04-27 DIAGNOSIS — Z79899 Other long term (current) drug therapy: Secondary | ICD-10-CM

## 2018-04-27 DIAGNOSIS — Z7901 Long term (current) use of anticoagulants: Secondary | ICD-10-CM

## 2018-04-27 DIAGNOSIS — J449 Chronic obstructive pulmonary disease, unspecified: Secondary | ICD-10-CM | POA: Diagnosis present

## 2018-04-27 HISTORY — DX: Raynaud's syndrome without gangrene: I73.00

## 2018-04-27 HISTORY — DX: Unspecified atrial fibrillation: I48.91

## 2018-04-27 HISTORY — DX: Peripheral vascular disease, unspecified: I73.9

## 2018-04-27 LAB — BASIC METABOLIC PANEL
Anion gap: 6 (ref 5–15)
BUN: 12 mg/dL (ref 8–23)
CO2: 24 mmol/L (ref 22–32)
CREATININE: 1.08 mg/dL (ref 0.61–1.24)
Calcium: 9.3 mg/dL (ref 8.9–10.3)
Chloride: 106 mmol/L (ref 98–111)
Glucose, Bld: 106 mg/dL — ABNORMAL HIGH (ref 70–99)
Potassium: 4.5 mmol/L (ref 3.5–5.1)
SODIUM: 136 mmol/L (ref 135–145)

## 2018-04-27 LAB — MAGNESIUM: Magnesium: 2 mg/dL (ref 1.7–2.4)

## 2018-04-27 MED ORDER — SODIUM CHLORIDE 0.9% FLUSH: 3.0000 mL | INTRAVENOUS | Status: DC | PRN

## 2018-04-27 MED ORDER — SODIUM CHLORIDE 0.9 % IV SOLN: 250.0000 mL | INTRAVENOUS | Status: DC | PRN

## 2018-04-27 MED ORDER — ATORVASTATIN CALCIUM 80 MG PO TABS
80.0000 mg | ORAL_TABLET | Freq: Every day | ORAL | Status: DC
  Administered 2018-04-27 – 2018-04-29 (×3): 80 mg via ORAL
  Filled 2018-04-27 (×3): qty 1

## 2018-04-27 MED ORDER — DOFETILIDE 500 MCG PO CAPS
500.0000 ug | ORAL_CAPSULE | Freq: Two times a day (BID) | ORAL | Status: DC
  Administered 2018-04-27 – 2018-04-30 (×6): 500 ug via ORAL
  Filled 2018-04-27 (×6): qty 1

## 2018-04-27 MED ORDER — ALPRAZOLAM 0.5 MG PO TABS: 1.0000 mg | ORAL_TABLET | Freq: Every evening | ORAL | Status: DC | PRN

## 2018-04-27 MED ORDER — CLOPIDOGREL BISULFATE 75 MG PO TABS
75.0000 mg | ORAL_TABLET | Freq: Every day | ORAL | Status: DC
  Administered 2018-04-28 – 2018-04-30 (×3): 75 mg via ORAL
  Filled 2018-04-27 (×3): qty 1

## 2018-04-27 MED ORDER — CHOLECALCIFEROL 10 MCG (400 UNIT) PO TABS
400.0000 [IU] | ORAL_TABLET | Freq: Every day | ORAL | Status: DC
  Administered 2018-04-28 – 2018-04-30 (×3): 400 [IU] via ORAL
  Filled 2018-04-27 (×3): qty 1

## 2018-04-27 MED ORDER — ALPRAZOLAM 0.5 MG PO TABS
0.5000 mg | ORAL_TABLET | Freq: Every evening | ORAL | Status: DC | PRN
  Administered 2018-04-27 – 2018-04-29 (×3): 0.5 mg via ORAL
  Filled 2018-04-27 (×3): qty 1

## 2018-04-27 MED ORDER — RIVAROXABAN 20 MG PO TABS
20.0000 mg | ORAL_TABLET | Freq: Every day | ORAL | Status: DC
  Administered 2018-04-27 – 2018-04-29 (×3): 20 mg via ORAL
  Filled 2018-04-27 (×3): qty 1

## 2018-04-27 MED ORDER — SODIUM CHLORIDE 0.9% FLUSH
3.0000 mL | Freq: Two times a day (BID) | INTRAVENOUS | Status: DC
  Administered 2018-04-27 – 2018-04-30 (×6): 3 mL via INTRAVENOUS

## 2018-04-27 NOTE — Progress Notes (Signed)
Primary Care Physician: [name], MD Referring Physician: Dr. Alma Downs Bradley Ferguson is a 67 y.o. male with a h/o atrial flutter ablation by Dr. Lovena Le in 2011, now  persistent afib of unknown duration  in the afib clinic for pending tikosyn admission.No benadryl use, no interruption in anticoagulation for at least 3 weeks.  He does not have Medicare D but plans to sign up this year. He is planning to pay for dofetilide thru [employer] around $150  a month.   Today, he denies symptoms of palpitations, chest pain, shortness of breath, orthopnea, PND, lower extremity edema, dizziness, presyncope, syncope, or neurologic sequela. The patient is tolerating medications without difficulties and is otherwise without complaint today.   Past Medical History:  Diagnosis Date  . Anxiety   . Blood transfusion without reported diagnosis   . Cataract   . Depression   . Emphysema of lung ([place])   . Heart murmur    Past Surgical History:  Procedure Laterality Date  . ATRIAL FIBRILLATION ABLATION    . EYE SURGERY    . FRACTURE SURGERY    . LOWER EXTREMITY INTERVENTION N/A 04/27/2017   Procedure: LOWER EXTREMITY INTERVENTION;  Surgeon: Lorretta Harp, MD;  Location: [place] [facility];  Service: Cardiovascular;  Laterality: N/A;  . PERIPHERAL VASCULAR ATHERECTOMY Right 04/27/2017   Procedure: PERIPHERAL VASCULAR ATHERECTOMY;  Surgeon: Lorretta Harp, MD;  Location: [facility] [facility];  Service: Cardiovascular;  Laterality: Right;  Iliac  . PERIPHERAL VASCULAR INTERVENTION Right 04/27/2017   Procedure: PERIPHERAL VASCULAR INTERVENTION;  Surgeon: Lorretta Harp, MD;  Location: [place];  Service: Cardiovascular;  Laterality: Right;  Iliac    Current Outpatient Medications  Medication Sig Dispense Refill  . ALPRAZolam (XANAX) 1 MG tablet Take 1/2-1 tablet 2 - 3 x /day ONLY if needed for Anxiety Attack &  limit to 5 days /week to avoid addiction 90 tablet 0  . atorvastatin  (LIPITOR) 80 MG tablet Take 1 tablet daily for Cholesterol 90 tablet 1  . Cholecalciferol (VITAMIN D PO) Take 1 tablet by mouth daily.    . clopidogrel (PLAVIX) 75 MG tablet Take 1 tablet daily for Circulation 90 tablet 1  . rivaroxaban (XARELTO) 20 MG TABS tablet Take 1 tablet daily for Atrial Fibrillation 30 tablet 11   No current facility-administered medications for this encounter.     Allergies  Allergen Reactions  . Effexor [Venlafaxine]     Feels bad  . Wellbutrin [Bupropion]     sleepy  . Zoloft [Sertraline Hcl]     Felt bad    Social History   Socioeconomic History  . Marital status: Legally Separated    Spouse name: Not on file  . Number of children: Not on file  . Years of education: Not on file  . Highest education level: Not on file  Occupational History  . Not on file  Social Needs  . Financial resource strain: Not on file  . Food insecurity:    Worry: Not on file    Inability: Not on file  . Transportation needs:    Medical: Not on file    Non-medical: Not on file  Tobacco Use  . Smoking status: Current Every Day Smoker    Packs/day: 0.75    Types: Cigarettes  . Smokeless tobacco: Never Used  Substance and Sexual Activity  . Alcohol use: Yes    Alcohol/week: 4.0 standard drinks    Types: 4 Standard drinks  or equivalent per week    Comment: 3-4 drinks per week  . Drug use: Never  . Sexual activity: Not on file  Lifestyle  . Physical activity:    Days per week: Not on file    Minutes per session: Not on file  . Stress: Not on file  Relationships  . Social connections:    Talks on phone: Not on file    Gets together: Not on file    Attends religious service: Not on file    Active member of club or organization: Not on file    Attends meetings of clubs or organizations: Not on file    Relationship status: Not on file  . Intimate partner violence:    Fear of current or ex partner: Not on file    Emotionally abused: Not on file    Physically  abused: Not on file    Forced sexual activity: Not on file  Other Topics Concern  . Not on file  Social History Narrative  . Not on file    Family History  Problem Relation Age of Onset  . Cancer Mother   . Cancer Father     ROS- All systems are reviewed and negative except as per the HPI above  Physical Exam: Vitals:   04/27/18 1017  BP: 130/84  Pulse: 94  Weight: 89.9 kg  Height: 6' 1.5" (1.867 m)   Wt Readings from Last 3 Encounters:  04/27/18 89.9 kg  03/29/18 90.2 kg  03/09/18 91.6 kg    Labs: Lab Results  Component Value Date   NA 142 02/23/2018   K 5.1 02/23/2018   CL 106 02/23/2018   CO2 28 02/23/2018   GLUCOSE 82 02/23/2018   BUN 13 02/23/2018   CREATININE 1.10 02/23/2018   CALCIUM 9.6 02/23/2018   MG 2.3 02/23/2018   Lab Results  Component Value Date   INR 1.0 04/16/2017   Lab Results  Component Value Date   CHOL 197 02/23/2018   HDL 38 (L) 02/23/2018   LDLCALC 127 (H) 02/23/2018   TRIG 198 (H) 02/23/2018     GEN- The patient is well appearing, alert and oriented x 3 today.   Head- normocephalic, atraumatic Eyes-  Sclera clear, conjunctiva pink Ears- hearing intact Oropharynx- clear Neck- supple, no JVP Lymph- no cervical lymphadenopathy Lungs- Clear to ausculation bilaterally, normal work of breathing Heart- irregular rate and rhythm, no murmurs, rubs or gallops, PMI not laterally displaced GI- soft, NT, ND, + BS Extremities- no clubbing, cyanosis, or edema MS- no significant deformity or atrophy Skin- no rash or lesion Psych- euthymic mood, full affect Neuro- strength and sensation are intact  EKG-afib at 94 bpm, RBBB, qtc 475 ms    Assessment and Plan: 1. Persistent afib General precautions re tikosyn Pt is not on any qtc prolonging drugs No benadryl use Aware of cost of drug, will have to pay out of pocket thru [facility] [employer] until he gets signed up for Medicare this year No interruption of anticoagulation for at least 3  weeks Continue xarelto 20 mg daily for a CHA2DS2VASc score of at least 2 Bmet/mag today   To be admitted later today when bed available  Butch Penny C. Lenny Bouchillon, [facility] [address] [place], [place] [address] [phone]

## 2018-04-27 NOTE — Plan of Care (Signed)
  Problem: Education: Goal: Knowledge of General Education information will improve Description Including pain rating scale, medication(s)/side effects and non-pharmacologic comfort measures Outcome: Completed/Met   Problem: Clinical Measurements: Goal: Respiratory complications will improve Outcome: Completed/Met

## 2018-04-27 NOTE — Progress Notes (Signed)
Pharmacy Review for Dofetilide (Tikosyn) Initiation  Admit Complaint: 67 y.o. male admitted 04/27/2018 with atrial fibrillation to be initiated on dofetilide.   Assessment:  Patient Exclusion Criteria: If any screening criteria checked as "Yes", then  patient  should NOT receive dofetilide until criteria item is corrected. If "Yes" please indicate correction plan.  YES  NO Patient  Exclusion Criteria Correction Plan  []  [x]  Baseline QTc interval is greater than or equal to 440 msec. IF above YES box checked dofetilide contraindicated unless patient has ICD; then may proceed if QTc 500-550 msec or with known ventricular conduction abnormalities may proceed with QTc 550-600 msec. QTc =  475 per ECG - ok per NP note 10/29   []  []  Magnesium level is less than 1.8 mEq/l : Last magnesium:  Lab Results  Component Value Date   MG 2.0 04/27/2018         []  [x]  Potassium level is less than 4 mEq/l : Last potassium:  Lab Results  Component Value Date   K 4.5 04/27/2018         []  [x]  Patient is known or suspected to have a digoxin level greater than 2 ng/ml: Lab Results  Component Value Date   DIGOXIN 0.7 (L) 04/28/2010      []  [x]  Creatinine clearance less than 20 ml/min (calculated using Cockcroft-Gault, actual body weight and serum creatinine): Estimated Creatinine Clearance: 77.2 mL/min (by C-G formula based on SCr of 1.08 mg/dL).    []  [x]  Patient has received drugs known to prolong the QT intervals within the last 48 hours (phenothiazines, tricyclics or tetracyclic antidepressants, erythromycin, H-1 antihistamines, cisapride, fluoroquinolones, azithromycin). Drugs not listed above may have an, as yet, undetected potential to prolong the QT interval, updated information on QT prolonging agents is available at this website:QT prolonging agents   []  [x]  Patient received a dose of hydrochlorothiazide (Oretic) alone or in any combination including triamterene (Dyazide, Maxzide) in the last  48 hours.   []  [x]  Patient received a medication known to increase dofetilide plasma concentrations prior to initial dofetilide dose:  . Trimethoprim (Primsol, Proloprim) in the last 36 hours . Verapamil (Calan, Verelan) in the last 36 hours or a sustained release dose in the last 72 hours . Megestrol (Megace) in the last 5 days  . Cimetidine (Tagamet) in the last 6 hours . Ketoconazole (Nizoral) in the last 24 hours . Itraconazole (Sporanox) in the last 48 hours  . Prochlorperazine (Compazine) in the last 36 hours    []  [x]  Patient is known to have a history of torsades de pointes; congenital or acquired long QT syndromes.   []  [x]  Patient has received a Class 1 antiarrhythmic with less than 2 half-lives since last dose. (Disopyramide, Quinidine, Procainamide, Lidocaine, Mexiletine, Flecainide, Propafenone)   []  [x]  Patient has received amiodarone therapy in the past 3 months or amiodarone level is greater than 0.3 ng/ml.    Patient has been appropriately anticoagulated with rivaroxaban 20mg  daily   Goal of Therapy: Follow renal function, electrolytes, potential drug interactions, and dose adjustment. Provide education and 1 week supply at discharge.  Plan:  []   Physician selected initial dose within range recommended for patients level of renal function - will monitor for response.  []   Physician selected initial dose outside of range recommended for patients level of renal function - will discuss if the dose should be altered at this time.   Select One Calculated CrCl  Dose q12h  [x]  > 60 ml/min 500 mcg  []   40-60 ml/min 250 mcg  []  20-40 ml/min 125 mcg   2. Follow up QTc after the first 5 doses, renal function, electrolytes (K & Mg) daily x 3     days, dose adjustment, success of initiation and facilitate 1 week discharge supply as     clinically indicated.  3. Initiate Tikosyn education video (Call [phone] and ask for Tikosyn Video # 116).  Bonnita Nasuti Pharm.D. CPP, BCPS Clinical  Pharmacist [phone] 04/27/2018 5:33 PM

## 2018-04-27 NOTE — H&P (Addendum)
Cardiology Admission History and Physical:   Patient ID: Bradley Ferguson MRN: [id number]; DOB: 1951-04-04   Admission date: 04/27/2018  Primary Care Provider: [name], MD Primary Cardiologist: Dr. [name] (PVD) Primary Electrophysiologist:  [name], MD   Chief Complaint:  Tikosyn initiation  Patient Profile:   Bradley Ferguson is a 67 y.o. male with PVD, COPD, HLD, HTN and persistent AFib   History of Present Illness:   Mr. Fergusson was referred to EP for management of his AFib, s/p flutter ablation in 2011, now with persistent AFib, felt best to pursue AAD therapy with Tikosyn.  Med list was evaluated by [facility] heart care RPH, no contraindicated medications noted, was seen in the AFib clinic today to facilitate Tikosyn admission.  He feels well, largely unaware of his AFib, infrequently feels palpitations, generally not much exertional tolerance that he has attributed to his PVD and smoking.  NO CP, no near syncope or syncope.  He confirms no missed doses of his xarelto in 3 weeks (longer)   Past Medical History:  Diagnosis Date  . Anxiety   . Blood transfusion without reported diagnosis   . Cataract   . Depression   . Emphysema of lung ([place])   . Heart murmur     Past Surgical History:  Procedure Laterality Date  . ATRIAL FIBRILLATION ABLATION    . EYE SURGERY    . FRACTURE SURGERY    . LOWER EXTREMITY INTERVENTION N/A 04/27/2017   Procedure: LOWER EXTREMITY INTERVENTION;  Surgeon: Lorretta Harp, MD;  Location: [place] [facility];  Service: Cardiovascular;  Laterality: N/A;  . PERIPHERAL VASCULAR ATHERECTOMY Right 04/27/2017   Procedure: PERIPHERAL VASCULAR ATHERECTOMY;  Surgeon: Lorretta Harp, MD;  Location: [place] [facility];  Service: Cardiovascular;  Laterality: Right;  Iliac  . PERIPHERAL VASCULAR INTERVENTION Right 04/27/2017   Procedure: PERIPHERAL VASCULAR INTERVENTION;  Surgeon: Lorretta Harp, MD;  Location: [place] [facility];   Service: Cardiovascular;  Laterality: Right;  Iliac     Medications Prior to Admission: Prior to Admission medications   Medication Sig Start Date End Date Taking? Authorizing Provider  ALPRAZolam Duanne Moron) 1 MG tablet Take 1/2-1 tablet 2 - 3 x /day ONLY if needed for Anxiety Attack &  limit to 5 days /week to avoid addiction 02/23/18   [name], MD  atorvastatin (LIPITOR) 80 MG tablet Take 1 tablet daily for Cholesterol 02/23/18   [name], MD  Cholecalciferol (VITAMIN D PO) Take 1 tablet by mouth daily.    [provider]  clopidogrel (PLAVIX) 75 MG tablet Take 1 tablet daily for Circulation 02/23/18   [name], MD  rivaroxaban Alveda Reasons) 20 MG TABS tablet Take 1 tablet daily for Atrial Fibrillation 02/23/18   [name], MD     Allergies:    Allergies  Allergen Reactions  . Effexor [Venlafaxine]     Feels bad  . Wellbutrin [Bupropion]     sleepy  . Zoloft [Sertraline Hcl]     Felt bad    Social History:   Social History   Socioeconomic History  . Marital status: Legally Separated    Spouse name: Not on file  . Number of children: Not on file  . Years of education: Not on file  . Highest education level: Not on file  Occupational History  . Not on file  Social Needs  . Financial resource strain: Not on file  . Food insecurity:    Worry: Not on file  Inability: Not on file  . Transportation needs:    Medical: Not on file    Non-medical: Not on file  Tobacco Use  . Smoking status: Current Every Day Smoker    Packs/day: 0.75    Types: Cigarettes  . Smokeless tobacco: Never Used  Substance and Sexual Activity  . Alcohol use: Yes    Alcohol/week: 4.0 standard drinks    Types: 4 Standard drinks or equivalent per week    Comment: 3-4 drinks per week  . Drug use: Never  . Sexual activity: Not on file  Lifestyle  . Physical activity:    Days per week: Not on file    Minutes per session: Not on file  . Stress: Not on file    Relationships  . Social connections:    Talks on phone: Not on file    Gets together: Not on file    Attends religious service: Not on file    Active member of club or organization: Not on file    Attends meetings of clubs or organizations: Not on file    Relationship status: Not on file  . Intimate partner violence:    Fear of current or ex partner: Not on file    Emotionally abused: Not on file    Physically abused: Not on file    Forced sexual activity: Not on file  Other Topics Concern  . Not on file  Social History Narrative  . Not on file    Family History:   The patient's family history includes Cancer in his father and mother.    ROS:  Please see the history of present illness.  All other ROS reviewed and negative.     Physical Exam/Data:   Vitals:   04/27/18 1550  BP: 122/76  Pulse: 84  Temp: 97.8 F (36.6 C)  TempSrc: Oral  SpO2: 99%   No intake or output data in the 24 hours ending 04/27/18 1600 There were no vitals filed for this visit. There is no height or weight on file to calculate BMI.  General:  Well nourished, well developed, in no acute distress HEENT: normal Lymph: no adenopathy Neck: no JVD Endocrine:  No thryomegaly Vascular: No carotid bruits  Cardiac:  irreg-irreg, no murmurs, gallops or rubs Lungs: no wheezing, soft scattered rhonchi, no rales  Abd: soft, nontender  Ext: no edema Musculoskeletal:  No deformities Skin: warm and dry  Neuro:  No gross focal abnormalities noted Psych:  Normal affect    EKG:  The ECG that was done today was personally reviewed and demonstrates  AFib 94bpm, measured QT 382ms, QTc 423ms  Relevant CV Studies:  03/11/18: TTE Study Conclusions - Left ventricle: The cavity size was normal. Wall thickness was   increased in a pattern of mild LVH. Systolic function was normal.   The estimated ejection fraction was in the range of 60% to 65%.   Wall motion was normal; there were no regional wall motion    abnormalities. - Aortic valve: Trileaflet; moderately thickened, moderately   calcified leaflets. Valve mobility was restricted. - Mitral valve: Calcified annulus. - Right atrium: The atrium was mildly dilated. - Tricuspid valve: There was mild regurgitation. - Pericardium, extracardiac: A trivial pericardial effusion was   identified posterior to the heart.  (LA 91mm)  Laboratory Data:  Chemistry Recent Labs  Lab 04/27/18 1043  NA 136  K 4.5  CL 106  CO2 24  GLUCOSE 106*  BUN 12  CREATININE 1.08  CALCIUM  9.3  GFRNONAA >60  GFRAA >60  ANIONGAP 6    No results for input(s): PROT, ALBUMIN, AST, ALT, ALKPHOS, BILITOT in the last 168 hours. HematologyNo results for input(s): WBC, RBC, HGB, HCT, MCV, MCH, MCHC, RDW, PLT in the last 168 hours. Cardiac EnzymesNo results for input(s): TROPONINI in the last 168 hours. No results for input(s): TROPIPOC in the last 168 hours.  BNPNo results for input(s): BNP, PROBNP in the last 168 hours.  DDimer No results for input(s): DDIMER in the last 168 hours.  Radiology/Studies:  No results found.  Assessment and Plan:   1. Persistent AFib     CHA2DS2Vasc is 3, on Xarelto, appropriately dosed     Admitting for Tikosyn initiation     K+ 4.5     Mag 2.0     Creat 1.08 (Calc CrCl 86)     QTc is OK to start  Discussed with the patient potential risks/benefits of Tikoyn, protocol, planned DCCV Thursday if not in SR, he is agreeable to proceed  2. PVD     Right CIA intervention 04/27/18     Continue home regime  3. HTN     Continue home meds   For questions or updates, please contact [place] Please consult [email or web address] for contact info under        Signed, Baldwin Jamaica, PA-C  04/27/2018 4:00 PM   EP Attending  Patient seen and examined. Agree with the findings as noted above. The patient has persistent atrial fib. I have reviewed the indications/risks of initiation of dofetilide and he wishes to proceed. His QT  interval in atrial fib is difficult to know for sure but I think it is ok.  Mikle Bosworth.D.

## 2018-04-27 NOTE — Progress Notes (Deleted)
Pt is receiving life vest instruction.  Idolina Primer, RN

## 2018-04-28 DIAGNOSIS — Z5181 Encounter for therapeutic drug level monitoring: Secondary | ICD-10-CM

## 2018-04-28 DIAGNOSIS — Z79899 Other long term (current) drug therapy: Secondary | ICD-10-CM

## 2018-04-28 LAB — BASIC METABOLIC PANEL
ANION GAP: 4 — AB (ref 5–15)
BUN: 12 mg/dL (ref 8–23)
CALCIUM: 9.1 mg/dL (ref 8.9–10.3)
CO2: 29 mmol/L (ref 22–32)
Chloride: 107 mmol/L (ref 98–111)
Creatinine, Ser: 1.07 mg/dL (ref 0.61–1.24)
GFR calc Af Amer: >60 mL/min (ref >60)
Glucose, Bld: 109 mg/dL — ABNORMAL HIGH (ref 70–99)
Potassium: 4.6 mmol/L (ref 3.5–5.1)
SODIUM: 140 mmol/L (ref 135–145)

## 2018-04-28 LAB — MAGNESIUM: MAGNESIUM: 2.1 mg/dL (ref 1.7–2.4)

## 2018-04-28 NOTE — Progress Notes (Signed)
Pt converted to NSR with rate of 73; QTc 498. Will continue to monitor.

## 2018-04-28 NOTE — Progress Notes (Addendum)
Progress Note  Patient Name: Bradley Ferguson Date of Encounter: 04/28/2018  Primary Cardiologist: Dr. [name]  Subjective   No complaints, tolerating drug, no CP, SOB or palpitations  Inpatient Medications    Scheduled Meds: . atorvastatin  80 mg Oral q1800  . cholecalciferol  400 Units Oral Daily  . clopidogrel  75 mg Oral Daily  . dofetilide  500 mcg Oral BID  . rivaroxaban  20 mg Oral Q supper  . sodium chloride flush  3 mL Intravenous Q12H   Continuous Infusions: . sodium chloride     PRN Meds: sodium chloride, ALPRAZolam, sodium chloride flush   Vital Signs    Vitals:   04/27/18 1550 04/27/18 2040 04/28/18 0500  BP: 122/76 101/71 103/68  Pulse: 84 77 72  Temp: 97.8 F (36.6 C) 98.2 F (36.8 C) 97.7 F (36.5 C)  TempSrc: Oral Oral Oral  SpO2: 99% 98% 98%  Weight: 89.3 kg  89 kg  Height: 6\' 1"  (1.854 m)      Intake/Output Summary (Last 24 hours) at 04/28/2018 1031 Last data filed at 04/28/2018 0920 Gross per 24 hour  Intake 963 ml  Output 600 ml  Net 363 ml   Filed Weights   04/27/18 1550 04/28/18 0500  Weight: 89.3 kg 89 kg    Telemetry    AFib, 70's-80's - Personally Reviewed  ECG    AFib 77bpm, reviewed with dr. Curt Bears, QT stable- Personally Reviewed  Physical Exam   GEN: No acute distress.   Neck: No JVD Cardiac: irreg-irreg, no murmurs, rubs, or gallops.  Respiratory: soft scattered rhonchi b/l. GI: Soft, nontender, non-distended  MS: No edema; No deformity. Neuro:  Nonfocal  Psych: Normal affect   Labs    Chemistry Recent Labs  Lab 04/27/18 1043 04/28/18 0433  NA 136 140  K 4.5 4.6  CL 106 107  CO2 24 29  GLUCOSE 106* 109*  BUN 12 12  CREATININE 1.08 1.07  CALCIUM 9.3 9.1  GFRNONAA >60 >60  GFRAA >60 >60  ANIONGAP 6 4*     HematologyNo results for input(s): WBC, RBC, HGB, HCT, MCV, MCH, MCHC, RDW, PLT in the last 168 hours.  Cardiac EnzymesNo results for input(s): TROPONINI in the last 168 hours. No results for  input(s): TROPIPOC in the last 168 hours.   BNPNo results for input(s): BNP, PROBNP in the last 168 hours.   DDimer No results for input(s): DDIMER in the last 168 hours.   Radiology    No results found.  Cardiac Studies   03/11/18: TTE Study Conclusions - Left ventricle: The cavity size was normal. Wall thickness was increased in a pattern of mild LVH. Systolic function was normal. The estimated ejection fraction was in the range of 60% to 65%. Wall motion was normal; there were no regional wall motion abnormalities. - Aortic valve: Trileaflet; moderately thickened, moderately calcified leaflets. Valve mobility was restricted. - Mitral valve: Calcified annulus. - Right atrium: The atrium was mildly dilated. - Tricuspid valve: There was mild regurgitation. - Pericardium, extracardiac: A trivial pericardial effusion was identified posterior to the heart.  (LA 61mm)  Patient Profile     67 y.o. male PMHX of with PVD, COPD, HLD, HTN and persistent AFib, admitted for Tikosyn initiation  Assessment & Plan    1. Persistent AFib     CHA2DS2Vasc is 3, on Xarelto, appropriately dosed     Admitting for Tikosyn initiation     K+ 4.6     Mag 2.1  Creat 1.07 (stable)     QTc is OK   DCCV tomorrow if not in SR, patient agreeable  2. PVD     Right CIA intervention 04/27/18     Continue home regime  3. HTN     Continue home meds  4. Longstanding smoker     Counseled, he was receptive    For questions or updates, please contact [place] Please consult [email or web address] for contact info under        Signed, Baldwin Jamaica, PA-C  04/28/2018, 10:31 AM    I have seen and examined this patient with Tommye Standard.  Agree with above, note added to reflect my findings.  On exam, iRRR, no murmurs, lungs clear.   Patient admitted for tikosyn loading. QTc stable. Remains in AF. Plan for cardioversion tomorrow.  Sahily Biddle M. Nakiea Metzner MD 04/28/2018 1:12 PM

## 2018-04-28 NOTE — Plan of Care (Signed)
  Problem: Clinical Measurements: Goal: Cardiovascular complication will be avoided Outcome: Progressing   Problem: Activity: Goal: Risk for activity intolerance will decrease Outcome: Completed/Met

## 2018-04-29 ENCOUNTER — Encounter (HOSPITAL_COMMUNITY)
Admission: AD | Disposition: A | Discharge status: Home / Self Care | Payer: Self-pay | Source: Ambulatory Visit | Attending: Cardiology

## 2018-04-29 LAB — BASIC METABOLIC PANEL
ANION GAP: 4 — AB (ref 5–15)
BUN: 15 mg/dL (ref 8–23)
CHLORIDE: 107 mmol/L (ref 98–111)
CO2: 26 mmol/L (ref 22–32)
Calcium: 8.7 mg/dL — ABNORMAL LOW (ref 8.9–10.3)
Creatinine, Ser: 1 mg/dL (ref 0.61–1.24)
GFR calc Af Amer: >60 mL/min (ref >60)
GFR calc non Af Amer: >60 mL/min (ref >60)
Glucose, Bld: 98 mg/dL (ref 70–99)
POTASSIUM: 4.2 mmol/L (ref 3.5–5.1)
Sodium: 137 mmol/L (ref 135–145)

## 2018-04-29 LAB — MAGNESIUM: Magnesium: 2.1 mg/dL (ref 1.7–2.4)

## 2018-04-29 SURGERY — CARDIOVERSION
Anesthesia: General

## 2018-04-29 NOTE — Progress Notes (Signed)
Progress Note  Patient Name: Bradley Ferguson Date of Encounter: 04/29/2018  Primary Cardiologist: Dr. [name]  Subjective   No complaints. Feeling well, no chest pain. Converted to sinus rhythm yesterday.  Inpatient Medications    Scheduled Meds: . atorvastatin  80 mg Oral q1800  . cholecalciferol  400 Units Oral Daily  . clopidogrel  75 mg Oral Daily  . dofetilide  500 mcg Oral BID  . rivaroxaban  20 mg Oral Q supper  . sodium chloride flush  3 mL Intravenous Q12H   Continuous Infusions: . sodium chloride     PRN Meds: sodium chloride, ALPRAZolam, sodium chloride flush   Vital Signs    Vitals:   04/28/18 0500 04/28/18 1409 04/28/18 1930 04/29/18 0439  BP: 103/68 108/66 102/65 (!) 94/59  Pulse: 72 67 65 76  Temp: 97.7 F (36.5 C) 98 F (36.7 C) 97.8 F (36.6 C) 97.9 F (36.6 C)  TempSrc: Oral Oral Oral Oral  SpO2: 98% 97% 98% 91%  Weight: 89 kg   90.2 kg  Height:        Intake/Output Summary (Last 24 hours) at 04/29/2018 0744 Last data filed at 04/28/2018 2030 Gross per 24 hour  Intake 723 ml  Output 600 ml  Net 123 ml   Filed Weights   04/27/18 1550 04/28/18 0500 04/29/18 0439  Weight: 89.3 kg 89 kg 90.2 kg    Telemetry    Sinus rhythm - Personally Reviewed  ECG    Sinus rhythm- Personally Reviewed  Physical Exam   GEN: Well nourished, well developed, in no acute distress  HEENT: normal  Neck: no JVD, carotid bruits, or masses Cardiac: RRR; no murmurs, rubs, or gallops,no edema  Respiratory:  clear to auscultation bilaterally, normal work of breathing GI: soft, nontender, nondistended, + BS MS: no deformity or atrophy  Skin: warm and dry Neuro:  Strength and sensation are intact Psych: euthymic mood, full affect    Labs    Chemistry Recent Labs  Lab 04/27/18 1043 04/28/18 0433 04/29/18 0427  NA 136 140 137  K 4.5 4.6 4.2  CL 106 107 107  CO2 24 29 26   GLUCOSE 106* 109* 98  BUN 12 12 15   CREATININE 1.08 1.07 1.00  CALCIUM  9.3 9.1 8.7*  GFRNONAA >60 >60 >60  GFRAA >60 >60 >60  ANIONGAP 6 4* 4*     HematologyNo results for input(s): WBC, RBC, HGB, HCT, MCV, MCH, MCHC, RDW, PLT in the last 168 hours.  Cardiac EnzymesNo results for input(s): TROPONINI in the last 168 hours. No results for input(s): TROPIPOC in the last 168 hours.   BNPNo results for input(s): BNP, PROBNP in the last 168 hours.   DDimer No results for input(s): DDIMER in the last 168 hours.   Radiology    No results found.  Cardiac Studies   03/11/18: TTE Study Conclusions - Left ventricle: The cavity size was normal. Wall thickness was increased in a pattern of mild LVH. Systolic function was normal. The estimated ejection fraction was in the range of 60% to 65%. Wall motion was normal; there were no regional wall motion abnormalities. - Aortic valve: Trileaflet; moderately thickened, moderately calcified leaflets. Valve mobility was restricted. - Mitral valve: Calcified annulus. - Right atrium: The atrium was mildly dilated. - Tricuspid valve: There was mild regurgitation. - Pericardium, extracardiac: A trivial pericardial effusion was identified posterior to the heart.  (LA 25mm)  Patient Profile     67 y.o. male PMHX of  with PVD, COPD, HLD, HTN and persistent AFib, admitted for Tikosyn initiation  Assessment & Plan    1. Persistent AFib     admit for Tikosyn load     K 4.2, Mg 2.1     Creatinine stable     QTc not overly prolonged continue current dose  This patients CHA2DS2-VASc Score and unadjusted Ischemic Stroke Rate (% per year) is equal to 3.2 % stroke rate/year from a score of 3  Above score calculated as 1 point each if present [CHF, HTN, DM, Vascular=MI/PAD/Aortic Plaque, Age if 65-74, or Male] Above score calculated as 2 points each if present [Age > 75, or Stroke/TIA/TE]   2. PVD     Right CIA intervention. Continue current meds  3. HTN     well controlled, no changes  4.  Longstanding smoker     Encouraged cessation  Salaam Battershell M. Hae Ahlers MD 04/29/2018 7:44 AM

## 2018-04-29 NOTE — Care Management Note (Signed)
Case Management Note  Patient Details  Name: Bradley Ferguson MRN: [id number] Date of Birth: 23-Dec-1950  Subjective/Objective: Pt presented for Tikosyn Load. Pt is without Rx drug Coverage part D at this time. CM discussed open enrollment with patient and getting a part D plan.                    Action/Plan: Pt is aware that Rx for 7 day supply witll be e-scribed to [facility] and they will deliver to bedside. Pt sates his local pharmacy is [facility]. Pt stated that he has the patient assistance application to fill out via [facility] that he needs to return. Patient will have the responsibility to pay for Tikosyn out of pocket initially and then hopefully he will have an answer via the Afib Clinic to see if approved for year supply. No further needs from CM at this time.    Expected Discharge Date:                  Expected Discharge Plan:  Home/Self Care  In-House Referral:  NA  Discharge planning Services  CM Consult  Post Acute Care Choice:  NA Choice offered to:  NA  DME Arranged:  N/A DME Agency:  NA  HH Arranged:  NA HH Agency:  NA  Status of Service:  Completed, signed off  If discussed at [place] of Stay Meetings, dates discussed:    Additional Comments:  Bethena Roys, RN 04/29/2018, 3:59 PM

## 2018-04-30 LAB — BASIC METABOLIC PANEL
ANION GAP: 4 — AB (ref 5–15)
BUN: 17 mg/dL (ref 8–23)
CO2: 27 mmol/L (ref 22–32)
Calcium: 8.7 mg/dL — ABNORMAL LOW (ref 8.9–10.3)
Chloride: 107 mmol/L (ref 98–111)
Creatinine, Ser: 1 mg/dL (ref 0.61–1.24)
GFR calc Af Amer: >60 mL/min (ref >60)
GLUCOSE: 102 mg/dL — AB (ref 70–99)
POTASSIUM: 4.4 mmol/L (ref 3.5–5.1)
SODIUM: 138 mmol/L (ref 135–145)

## 2018-04-30 LAB — MAGNESIUM: Magnesium: 2.1 mg/dL (ref 1.7–2.4)

## 2018-04-30 MED ORDER — DOFETILIDE 500 MCG PO CAPS: 500.0000 ug | ORAL_CAPSULE | Freq: Two times a day (BID) | ORAL | 6 refills | Status: DC

## 2018-04-30 MED ORDER — DOFETILIDE 500 MCG PO CAPS: 500.0000 ug | ORAL_CAPSULE | Freq: Two times a day (BID) | ORAL | 0 refills | Status: DC

## 2018-04-30 MED FILL — TIKOSYN 500 MCG CAPS: 500 | 7 days supply | Qty: 14 | Fill #0

## 2018-04-30 NOTE — Discharge Summary (Addendum)
ELECTROPHYSIOLOGY PROCEDURE DISCHARGE SUMMARY    Patient ID: Bradley Ferguson,  MRN: [id number], DOB/AGE: [birth year] 4 y.o.  Admit date: 04/27/2018 Discharge date: 04/30/2018  Primary Care Physician: Bradley Pinto, MD Primary Cardiologist: Dr. [name] (PVD) Electrophysiologist: Dr. Curt Bears  Primary Discharge Diagnosis:  1.  Persistent atrial fibrillation status post Tikosyn loading this admission      CHA2DS2Vasc is 3, on Xarelto, appropriately dosed  Secondary Discharge Diagnosis:  1. COPD 2. HTN 3. HLD  Allergies  Allergen Reactions  . Effexor [Venlafaxine]     Feels bad  . Wellbutrin [Bupropion]     sleepy  . Zoloft [Sertraline Hcl]     Felt bad     Procedures This Admission:  1.  Tikosyn loading   Brief HPI: Bradley Ferguson is a 67 y.o. male with a past medical history as noted above.  He is followed by EP in the outpatient setting for atrial fibrillation.  Risks, benefits, and alternatives to Tikosyn were reviewed with the patient who wished to proceed.    Hospital Course:  The patient was admitted and Tikosyn was initiated.  Renal function and electrolytes were followed during the hospitalization.  Their QTc remained stable.  He converted with drug and did not require DCCV  The patient was monitored until discharge on telemetry which demonstrated SR.  On the day of discharge, he feels well, was examined by Dr Curt Bears who considered them stable for discharge to home.  Follow-up has been arranged with the AFib clinic in 1 week and with Dr Curt Bears in 4 weeks.   Patient had self researched and plans to fill through [facility] system that [name] be affordable for him until next year's benefits, this in lengthy discussions with the AFib  Clinic staff, not able to apply for patient assistance, patient confirms this, h e [name] fill through his usual pharmacy with the [facility].  Physical Exam: Vitals:   04/29/18 0439 04/29/18 1300 04/29/18 2110 04/30/18 0532  BP: (!) 94/59  106/61 105/84 107/64  Pulse: 76 75 70 66  Resp:  15 17 15   Temp: 97.9 F (36.6 C) 98 F (36.7 C) 98.3 F (36.8 C) 97.9 F (36.6 C)  TempSrc: Oral Oral Oral Oral  SpO2: 91%  98% 98%  Weight: 90.2 kg   89.7 kg  Height:        GEN- The patient is well appearing, alert and oriented x 3 today.   HEENT: normocephalic, atraumatic; sclera clear, conjunctiva pink; hearing intact; oropharynx clear; neck supple, no JVP Lymph- no cervical lymphadenopathy Lungs- Soft scattered rhonchi, no wheezes, rales Heart- RRR, no murmurs, rubs or gallops, PMI not laterally displaced GI- soft, non-tender, non-distended Extremities- no clubbing, cyanosis, or edema MS- no significant deformity or atrophy Skin- warm and dry, no rash or lesion Psych- euthymic mood, full affect Neuro- strength and sensation are intact   Labs:   Lab Results  Component Value Date   WBC 8.0 02/23/2018   HGB 14.3 02/23/2018   HCT 42.5 02/23/2018   MCV 90.0 02/23/2018   PLT 177 02/23/2018    Recent Labs  Lab 04/30/18 0428  NA 138  K 4.4  CL 107  CO2 27  BUN 17  CREATININE 1.00  CALCIUM 8.7*  GLUCOSE 102*     Discharge Medications:  Allergies as of 04/30/2018      Reactions   Effexor [venlafaxine]    Feels bad   Wellbutrin [bupropion]    sleepy   Zoloft [  sertraline Hcl]    Felt bad      Medication List    TAKE these medications   ALPRAZolam 1 MG tablet Commonly known as:  XANAX Take 1/2-1 tablet 2 - 3 x /day ONLY if needed for Anxiety Attack &  limit to 5 days /week to avoid addiction What changed:    how much to take  how to take this  when to take this  reasons to take this   atorvastatin 80 MG tablet Commonly known as:  LIPITOR Take 1 tablet daily for Cholesterol What changed:    how much to take  how to take this  when to take this  additional instructions   cholecalciferol 1000 units tablet Commonly known as:  VITAMIN D Take 1,000 Units by mouth every morning.     clopidogrel 75 MG tablet Commonly known as:  PLAVIX Take 1 tablet daily for Circulation What changed:    how much to take  how to take this  when to take this  additional instructions   dofetilide 500 MCG capsule Commonly known as:  TIKOSYN Take 1 capsule (500 mcg total) by mouth 2 (two) times daily.   rivaroxaban 20 MG Tabs tablet Commonly known as:  XARELTO Take 1 tablet daily for Atrial Fibrillation What changed:    how much to take  how to take this  when to take this  additional instructions       Disposition: Home Discharge Instructions    Diet - low sodium heart healthy   Complete by:  As directed    Increase activity slowly   Complete by:  As directed      Follow-up Information    [facility] Follow up on 05/10/2018.   Specialty:  Cardiology Why:  9:30AM Contact information: [address] [id number] [place] [address] [phone]       Constance Haw, MD Follow up on 06/01/2018.   Specialty:  Cardiology Why:  10:45AM Contact information: [place] [address] [phone]           Duration of Discharge Encounter: Greater than 30 minutes including physician time.  Signed, Tommye Standard, PA-C 04/30/2018 12:40 PM  I have seen and examined this patient with Tommye Standard.  Agree with above, note added to reflect my findings.  On exam, RRR, no murmurs, lungs clear.   Patient admitted for dofetilide load.  QTC remains stable throughout.  Plan for discharge with follow-up in clinic.  Isam Unrein M. Fionn Stracke MD 04/30/2018 3:12 PM

## 2018-04-30 NOTE — Care Management Important Message (Signed)
Important Message  Patient Details  Name: Bradley Ferguson MRN: [id number] Date of Birth: 1950/08/30   Medicare Important Message Given:  Yes    Kenda Kloehn P Cleon Thoma 04/30/2018, 4:11 PM

## 2018-05-10 ENCOUNTER — Encounter (HOSPITAL_COMMUNITY): Payer: Self-pay | Admitting: Nurse Practitioner

## 2018-05-10 ENCOUNTER — Ambulatory Visit (HOSPITAL_COMMUNITY)
Admit: 2018-05-10 | Discharge: 2018-05-10 | Disposition: A | Discharge status: Home / Self Care | Payer: Medicare Other | Source: Ambulatory Visit | Attending: Nurse Practitioner | Admitting: Nurse Practitioner

## 2018-05-10 VITALS — BP 124/68 | HR 87 | Ht 73.0 in | Wt 198.0 lb

## 2018-05-10 DIAGNOSIS — J439 Emphysema, unspecified: Secondary | ICD-10-CM | POA: Diagnosis not present

## 2018-05-10 DIAGNOSIS — Z7901 Long term (current) use of anticoagulants: Secondary | ICD-10-CM | POA: Diagnosis not present

## 2018-05-10 DIAGNOSIS — F17210 Nicotine dependence, cigarettes, uncomplicated: Secondary | ICD-10-CM | POA: Diagnosis not present

## 2018-05-10 DIAGNOSIS — Z888 Allergy status to other drugs, medicaments and biological substances status: Secondary | ICD-10-CM | POA: Insufficient documentation

## 2018-05-10 DIAGNOSIS — I739 Peripheral vascular disease, unspecified: Secondary | ICD-10-CM | POA: Insufficient documentation

## 2018-05-10 DIAGNOSIS — I4892 Unspecified atrial flutter: Secondary | ICD-10-CM | POA: Diagnosis present

## 2018-05-10 DIAGNOSIS — Z7902 Long term (current) use of antithrombotics/antiplatelets: Secondary | ICD-10-CM | POA: Diagnosis not present

## 2018-05-10 DIAGNOSIS — F419 Anxiety disorder, unspecified: Secondary | ICD-10-CM | POA: Diagnosis not present

## 2018-05-10 DIAGNOSIS — Z79899 Other long term (current) drug therapy: Secondary | ICD-10-CM | POA: Insufficient documentation

## 2018-05-10 DIAGNOSIS — I4819 Other persistent atrial fibrillation: Secondary | ICD-10-CM | POA: Insufficient documentation

## 2018-05-10 DIAGNOSIS — F329 Major depressive disorder, single episode, unspecified: Secondary | ICD-10-CM | POA: Diagnosis not present

## 2018-05-10 LAB — BASIC METABOLIC PANEL
ANION GAP: 6 (ref 5–15)
BUN: 13 mg/dL (ref 8–23)
CALCIUM: 9.2 mg/dL (ref 8.9–10.3)
CO2: 28 mmol/L (ref 22–32)
Chloride: 104 mmol/L (ref 98–111)
Creatinine, Ser: 1.17 mg/dL (ref 0.61–1.24)
Glucose, Bld: 123 mg/dL — ABNORMAL HIGH (ref 70–99)
Potassium: 4.8 mmol/L (ref 3.5–5.1)
SODIUM: 138 mmol/L (ref 135–145)

## 2018-05-10 LAB — MAGNESIUM: MAGNESIUM: 2 mg/dL (ref 1.7–2.4)

## 2018-05-10 NOTE — Progress Notes (Addendum)
Primary Care Physician: [name], MD Referring Physician: Dr. Alma Downs Bradley Ferguson is a 67 y.o. male with a h/o atrial flutter ablation by Dr. Lovena Le in 2011, now  persistent afib of unknown duration  in the afib clinic for pending tikosyn admission.No benadryl use, no interruption in anticoagulation for at least 3 weeks.  He does not have Medicare D but plans to sign up this year. He is planning to pay for dofetilide thru [facility] around $150  a month.   F/u in afib clinic, 11/11. He is here for f/u Tikosyn admit. He is in SR. He converted with drug in the hospital. Qtc stable.  Today, he denies symptoms of palpitations, chest pain, shortness of breath, orthopnea, PND, lower extremity edema, dizziness, presyncope, syncope, or neurologic sequela. The patient is tolerating medications without difficulties and is otherwise without complaint today.   Past Medical History:  Diagnosis Date  . Anxiety   . Atrial fibrillation ([place])   . Blood transfusion without reported diagnosis   . Cataract   . Depression   . Emphysema of lung ([place])   . Heart murmur   . PVD (peripheral vascular disease) ([place])   . Raynaud's syndrome    Past Surgical History:  Procedure Laterality Date  . ATRIAL FIBRILLATION ABLATION    . EYE SURGERY    . FRACTURE SURGERY    . LOWER EXTREMITY INTERVENTION N/A 04/27/2017   Procedure: LOWER EXTREMITY INTERVENTION;  Surgeon: Lorretta Harp, MD;  Location: [facility];  Service: Cardiovascular;  Laterality: N/A;  . PERIPHERAL VASCULAR ATHERECTOMY Right 04/27/2017   Procedure: PERIPHERAL VASCULAR ATHERECTOMY;  Surgeon: Lorretta Harp, MD;  Location: [facility];  Service: Cardiovascular;  Laterality: Right;  Iliac  . PERIPHERAL VASCULAR INTERVENTION Right 04/27/2017   Procedure: PERIPHERAL VASCULAR INTERVENTION;  Surgeon: Lorretta Harp, MD;  Location: [facility];  Service: Cardiovascular;  Laterality: Right;  Iliac    Current  Outpatient Medications  Medication Sig Dispense Refill  . ALPRAZolam (XANAX) 1 MG tablet Take 1/2-1 tablet 2 - 3 x /day ONLY if needed for Anxiety Attack &  limit to 5 days /week to avoid addiction (Patient taking differently: Take 0.5-1 mg by mouth 3 (three) times daily as needed for anxiety. Take 1/2-1 tablet 2 - 3 x /day ONLY if needed for Anxiety Attack &  limit to 5 days /week to avoid addiction) 90 tablet 0  . atorvastatin (LIPITOR) 80 MG tablet Take 1 tablet daily for Cholesterol (Patient taking differently: Take 80 mg by mouth daily at 6 PM. ) 90 tablet 1  . cholecalciferol (VITAMIN D) 1000 units tablet Take 1,000 Units by mouth every morning.     . clopidogrel (PLAVIX) 75 MG tablet Take 1 tablet daily for Circulation (Patient taking differently: Take 75 mg by mouth every morning. ) 90 tablet 1  . dofetilide (TIKOSYN) 500 MCG capsule Take 1 capsule (500 mcg total) by mouth 2 (two) times daily. 60 capsule 6  . rivaroxaban (XARELTO) 20 MG TABS tablet Take 1 tablet daily for Atrial Fibrillation (Patient taking differently: Take 20 mg by mouth every evening. ) 30 tablet 11   No current facility-administered medications for this encounter.     Allergies  Allergen Reactions  . Effexor [Venlafaxine]     Feels bad  . Wellbutrin [Bupropion]     sleepy  . Zoloft [Sertraline Hcl]     Felt bad    Social History  Socioeconomic History  . Marital status: Legally Separated    Spouse name: Not on file  . Number of children: Not on file  . Years of education: Not on file  . Highest education level: Not on file  Occupational History  . Not on file  Social Needs  . Financial resource strain: Not on file  . Food insecurity:    Worry: Not on file    Inability: Not on file  . Transportation needs:    Medical: Not on file    Non-medical: Not on file  Tobacco Use  . Smoking status: Current Every Day Smoker    Packs/day: 0.75    Types: Cigarettes  . Smokeless tobacco: Never Used    Substance and Sexual Activity  . Alcohol use: Yes    Alcohol/week: 4.0 standard drinks    Types: 4 Standard drinks or equivalent per week    Comment: 3-4 drinks per week  . Drug use: Never  . Sexual activity: Not on file  Lifestyle  . Physical activity:    Days per week: Not on file    Minutes per session: Not on file  . Stress: Not on file  Relationships  . Social connections:    Talks on phone: Not on file    Gets together: Not on file    Attends religious service: Not on file    Active member of club or organization: Not on file    Attends meetings of clubs or organizations: Not on file    Relationship status: Not on file  . Intimate partner violence:    Fear of current or ex partner: Not on file    Emotionally abused: Not on file    Physically abused: Not on file    Forced sexual activity: Not on file  Other Topics Concern  . Not on file  Social History Narrative  . Not on file    Family History  Problem Relation Age of Onset  . Cancer Mother   . Cancer Father     ROS- All systems are reviewed and negative except as per the HPI above  Physical Exam: Vitals:   05/10/18 0928  BP: 124/68  Pulse: 87  Weight: 89.8 kg  Height: 6\' 1"  (1.854 m)   Wt Readings from Last 3 Encounters:  05/10/18 89.8 kg  04/30/18 89.7 kg  04/27/18 89.9 kg    Labs: Lab Results  Component Value Date   NA 138 04/30/2018   K 4.4 04/30/2018   CL 107 04/30/2018   CO2 27 04/30/2018   GLUCOSE 102 (H) 04/30/2018   BUN 17 04/30/2018   CREATININE 1.00 04/30/2018   CALCIUM 8.7 (L) 04/30/2018   MG 2.1 04/30/2018   Lab Results  Component Value Date   INR 1.0 04/16/2017   Lab Results  Component Value Date   CHOL 197 02/23/2018   HDL 38 (L) 02/23/2018   LDLCALC 127 (H) 02/23/2018   TRIG 198 (H) 02/23/2018     GEN- The patient is well appearing, alert and oriented x 3 today.   Head- normocephalic, atraumatic Eyes-  Sclera clear, conjunctiva pink Ears- hearing  intact Oropharynx- clear Neck- supple, no JVP Lymph- no cervical lymphadenopathy Lungs- Clear to ausculation bilaterally, normal work of breathing Heart-regular rate and rhythm, no murmurs, rubs or gallops, PMI not laterally displaced GI- soft, NT, ND, + BS Extremities- no clubbing, cyanosis, or edema MS- no significant deformity or atrophy Skin- no rash or lesion Psych- euthymic mood, full affect Neuro-  strength and sensation are intact  EKG- NSR at 87 bpm, RBBB, LAFB, pr int 160 ms, qrs int 124 ms, qtc 476 ms    Assessment and Plan: 1. Persistent afib General precautions re tikosyn Reminded no benadryl use Aware of cost of drug, will have to pay out of pocket thru [facility] until he gets signed up for Medicare after the first of the year Reminded no interruption of anticoagulation for at least 4 weeks Continue xarelto 20 mg daily for a CHA2DS2VASc score of at least 2 Bmet/mag today   F/u with Dr. Curt Bears 12/3  Geroge Baseman. Oliveah Zwack, [facility] [address] [place], [place] [address] [phone]

## 2018-05-17 ENCOUNTER — Encounter: Payer: Self-pay | Admitting: *Deleted

## 2018-05-17 ENCOUNTER — Telehealth: Payer: Self-pay | Admitting: Cardiology

## 2018-05-17 NOTE — Telephone Encounter (Signed)
New Message:   Patient calling he needs a note to go back to work can be faxed at [phone]. Attn: to [facility]. Please call patient if you need information.

## 2018-05-17 NOTE — Telephone Encounter (Signed)
Return to work note faxed to requested #. Confirmation received.

## 2018-05-17 NOTE — Telephone Encounter (Signed)
Pt needs work note to return to work 11/4.  Informed that I would complete and fax before I leave this evening. Pt appreciates the help

## 2018-05-25 NOTE — Progress Notes (Signed)
MEDICARE ANNUAL WELLNESS VISIT AND FOLLOW UP Assessment:    Bradley Ferguson was seen today for follow-up and medicare wellness.  Diagnoses and all orders for this visit:  Encounter for Medicare annual wellness exam  Labile hypertension Continue medication Monitor blood pressure at home; call if consistently over 130/80 Continue DASH diet.   Reminder to go to the ER if any CP, SOB, nausea, dizziness, severe HA, changes vision/speech, left arm numbness and tingling and jaw pain.  Paroxysmal atrial fibrillation (HCC) Rate controlled today, doing well on tikosyn, xarelto  Has followed up with cardiology for tikosyn levels Followed by Dr. [name]   Raynaud's disease without gangrene Improved recently, continue to monitor   BPH Recently well controlled, denies symptoms  Vitamin D deficiency Continue supplementation- taking 10000 IU daily  Check vitamin D level -     VITAMIN D 25 Hydroxy (Vit-D Deficiency, Fractures)  TOBACCO ABUSE Discussed risks associated with tobacco use and advised to reduce or quit Patient is ready to do so and plans to slow taper Declines Chantix or other medication Will follow up at the next visit  Prediabetes Discussed disease and risks Discussed diet/exercise, weight management  Reminded to schedule appointment with ophthalmology -     Hemoglobin A1c  Hyperlipidemia, mixed Continue medications: newly on atorvastatin 80 mg, ? Arthralgias, will trial off for 2 weeks and call back, consider switch to rosuvastatin Continue low cholesterol diet and exercise.  Check lipid panel.  -     Lipid panel -     TSH  Claudication in peripheral vascular disease (HCC) Followed by Dr. [name], s/p stent, continued claudication Continue plavix, cholesterol Recommended to quit smoking  Bipolar I disorder ([place]) Currently stable off of medications Lifestyle discussed: diet/exerise, sleep hygiene, stress management, hydration  Attention deficit disorder, unspecified  hyperactivity presence Currently stable, doing well at current job off of medications  ANXIETY DEPRESSION Well managed by current regimen; continue medications- using benzo sparingly Stress management techniques discussed, increase water, good sleep hygiene discussed, increase exercise, and increase veggies.   BMI 26.0-26.9,adult Long discussion about weight loss, diet, and exercise Recommended diet heavy in fruits and veggies and low in animal meats, cheeses, and dairy products, appropriate calorie intake Discussed appropriate weight for height Follow up at next visit  Medication management -     CBC with Differential/Platelet -     COMPLETE METABOLIC PANEL WITH GFR -     Magnesium  Over 30 minutes of exam, counseling, chart review, and critical decision making was performed  Future Appointments  Date Time Provider [place]  06/01/2018 10:45 AM Constance Haw, MD [facility]  08/26/2018  2:30 PM [name], MD [facility] None  03/28/2019  2:00 PM [name], MD [facility] None     Plan:   During the course of the visit the patient was educated and counseled about appropriate screening and preventive services including:    Pneumococcal vaccine   Influenza vaccine  Prevnar 13  Td vaccine  Screening electrocardiogram  Colorectal cancer screening  Diabetes screening  Glaucoma screening  Nutrition counseling    Subjective:  Bradley Ferguson is a 67 y.o. male who presents for Medicare Annual Wellness Visit and 3 month follow up for HTN, hyperlipidemia, prediabetes, and vitamin D Def. Hx/o pAfib s/p ablation in 2011and was seen recently for CPE and was back in Afib and started on Xarelto, recently was admitted for initiation of Tikosyn with successful conversion in Oct 2019. He is followed by Dr. Rayann Heman and  also Dr. [name] for PVD with claudification, peripheral angiography on  04/27/17 revealing a 99% calcified eccentric proximal  right common iliac artery stenosis and bilateral long segment SFA chronic total occlusions which were highly calcified. Pt underwent direct back orbital rotational atherectomy, PTCA and cover stenting using a 7 mm x 39 mm long VBX covered stent.  He does have hx of mixed bipolar type depression with ADD and anxiety though in recently years has been apparently stable on xanax, recently taking very rarely, averages 1/2 tab 2-3 times a week. He reports doing well with current regimen.   he currently continues to smoke 0.25 pack a day; discussed risks associated with smoking, patient is not ready to quit, but slowly tapering down. He gets CXR through work every 2 years, just got a few months ago.   BMI is Body mass index is 26.42 kg/m., he has been working on diet, exercise limited by cardiovascular disease ( Wt Readings from Last 3 Encounters:  05/26/18 203 lb (92.1 kg)  05/10/18 198 lb (89.8 kg)  04/30/18 197 lb 11.2 oz (89.7 kg)   His blood pressure has been controlled at home, today their BP is BP: 110/60 He does not workout. He denies chest pain, shortness of breath, dizziness.   He is on cholesterol medication (atorvastatin 80 mg daily) and denies myalgias. His cholesterol is not at goal. The cholesterol last visit was:   Lab Results  Component Value Date   CHOL 197 02/23/2018   HDL 38 (L) 02/23/2018   LDLCALC 127 (H) 02/23/2018   TRIG 198 (H) 02/23/2018   CHOLHDL 5.2 (H) 02/23/2018   He has not been working on diet and exercise for prediabetes, and denies increased appetite, nausea, polydipsia, polyuria, visual disturbances, vomiting and weight loss. Last A1C in the office was:  Lab Results  Component Value Date   HGBA1C 6.0 (H) 02/23/2018   Last GFR Lab Results  Component Value Date   GFRNONAA >60 05/10/2018   Patient is on Vitamin D supplement, taking 10000 IU daily    Lab Results  Component Value Date   VD25OH 23 (L) 02/23/2018      Medication Review:   Current  Outpatient Medications (Cardiovascular):  .  atorvastatin (LIPITOR) 80 MG tablet, Take 1 tablet daily for Cholesterol (Patient taking differently: Take 80 mg by mouth daily at 6 PM. ) .  dofetilide (TIKOSYN) 500 MCG capsule, Take 1 capsule (500 mcg total) by mouth 2 (two) times daily.    Current Outpatient Medications (Hematological):  .  clopidogrel (PLAVIX) 75 MG tablet, Take 1 tablet daily for Circulation (Patient taking differently: Take 75 mg by mouth every morning. ) .  rivaroxaban (XARELTO) 20 MG TABS tablet, Take 1 tablet daily for Atrial Fibrillation (Patient taking differently: Take 20 mg by mouth every evening. )  Current Outpatient Medications (Other):  Marland Kitchen  ALPRAZolam (XANAX) 1 MG tablet, Take 1/2-1 tablet 2 - 3 x /day ONLY if needed for Anxiety Attack &  limit to 5 days /week to avoid addiction (Patient taking differently: Take 0.5-1 mg by mouth 3 (three) times daily as needed for anxiety. Take 1/2-1 tablet 2 - 3 x /day ONLY if needed for Anxiety Attack &  limit to 5 days /week to avoid addiction) .  CHOLECALCIFEROL PO, Take 10,000 Units by mouth daily.  Allergies: Allergies  Allergen Reactions  . Effexor [Venlafaxine]     Feels bad  . Wellbutrin [Bupropion]     sleepy  . Zoloft [Sertraline Hcl]  Felt bad    Current Problems (verified) has ANXIETY DEPRESSION; TOBACCO ABUSE; RAYNAUDS SYNDROME; Attention deficit disorder; Dental caries; Bipolar I disorder ([place]); Atrial fibrillation ([place]); Hyperlipidemia, mixed; Vitamin D deficiency; Labile hypertension; Prediabetes; BPH; Claudication in peripheral vascular disease ([place]); and Visit for monitoring Tikosyn therapy on their problem list.  Screening Tests Immunization History  Administered Date(s) Administered  . Influenza Whole 04/30/2010  . Influenza, High Dose Seasonal PF 03/20/2017, 03/29/2018  . Pneumococcal Conjugate-13 02/05/2017  . Pneumococcal Polysaccharide-23 04/30/2010, 03/29/2018  . Td 06/30/2013     Preventative care: Last colonoscopy: hasn't had due to insurance, ok to refer today CXR: 2011, getting every 2 years through work, reports requested  Prior vaccinations: TD or Tdap: 2015  Influenza: 02/2018  Pneumococcal: 2019 Prevnar13: 2018 Shingles/Zostavax: declines   Names of Other Physician/Practitioners you currently use: 1. [facility] here for primary care 2. Dr. Katy Fitch, eye doctor, last visit years ago, needs to schedule 3. Full dentures   Patient Care Team: [name], MD as PCP - General (Internal Medicine) Constance Haw, MD as PCP - Electrophysiology (Cardiology) Lorretta Harp, MD as Consulting Physician (Cardiology)  Surgical: He  has a past surgical history that includes Eye surgery; Fracture surgery; Atrial fibrillation ablation; LOWER EXTREMITY INTERVENTION (N/A, 04/27/2017); PERIPHERAL VASCULAR INTERVENTION (Right, 04/27/2017); and PERIPHERAL VASCULAR ATHERECTOMY (Right, 04/27/2017). Family His family history includes Cancer in his father and mother. Social history  He reports that he has been smoking cigarettes. He has a 30.00 pack-year smoking history. He has never used smokeless tobacco. He reports that he drinks about 4.0 standard drinks of alcohol per week. He reports that he does not use drugs.  MEDICARE WELLNESS OBJECTIVES: Physical activity: Current Exercise Habits: The patient does not participate in regular exercise at present, Exercise limited by: Other - see comments;cardiac condition(s) Cardiac risk factors: Cardiac Risk Factors include: advanced age (>47men, >57 women);dyslipidemia;hypertension;male gender;sedentary lifestyle;smoking/ tobacco exposure Depression/mood screen:   Depression screen [facility] 2/9 05/26/2018  Decreased Interest 0  Down, Depressed, Hopeless 1  PHQ - 2 Score 1  Altered sleeping -  Tired, decreased energy -  Change in appetite -  Feeling bad or failure about yourself  -   Trouble concentrating -  Moving slowly or fidgety/restless -  Suicidal thoughts -  PHQ-9 Score -  Difficult doing work/chores -    ADLs:  In your present state of health, do you have any difficulty performing the following activities: 05/26/2018 04/27/2018  Hearing? N -  Vision? Y -  Comment has L cataract, pending surgery next year -  Difficulty concentrating or making decisions? N -  Walking or climbing stairs? Y -  Comment PVD with claudication -  Dressing or bathing? N -  Doing errands, shopping? N N  Some recent data might be hidden     Cognitive Testing  Alert? Yes  Normal Appearance?Yes  Oriented to person? Yes  Place? Yes   Time? Yes  Recall of three objects?  Yes  Can perform simple calculations? Yes  Displays appropriate judgment?Yes  Can read the correct time from a watch face?Yes  EOL planning: Does Patient Have a Medical Advance Directive?: No Would patient like information on creating a medical advance directive?: No - Patient declined   Objective:   Today's Vitals   05/26/18 1117  BP: 110/60  Pulse: 66  Temp: 97.7 F (36.5 C)  SpO2: 99%  Weight: 203 lb (92.1 kg)  Height: 6' 1.5" (1.867 m)  Body mass index is 26.42 kg/m.  General appearance: alert, no distress, WD/WN, male HEENT: normocephalic, sclerae anicteric, TMs pearly, nares patent, no discharge or erythema, pharynx normal Oral cavity: MMM, no lesions Neck: supple, no lymphadenopathy, no thyromegaly, no masses Heart: RRR, normal S1, S2, no murmurs Lungs: CTA bilaterally, mildly coarse over bronchioles anteriorly, no wheezes, rhonchi, or rales Abdomen: +bs, soft, non tender, non distended, no masses, no hepatomegaly, no splenomegaly Musculoskeletal: nontender, no swelling, no obvious deformity Extremities: no edema, no cyanosis, no clubbing Pulses: 2+ symmetric, upper extreamities, Peripheral pulses not palpable and without edema. No aortic or femoral bruits. Neurological: alert, oriented  x 3, CN2-12 intact, strength normal upper extremities and lower extremities, sensation normal throughout, DTRs 2+ throughout, no cerebellar signs, gait normal Psychiatric: normal affect, behavior normal, pleasant   Medicare Attestation I have personally reviewed: The patient's medical and social history Their use of alcohol, tobacco or illicit drugs Their current medications and supplements The patient's functional ability including ADLs,fall risks, home safety risks, cognitive, and hearing and visual impairment Diet and physical activities Evidence for depression or mood disorders  The patient's weight, height, BMI, and visual acuity have been recorded in the chart.  I have made referrals, counseling, and provided education to the patient based on review of the above and I have provided the patient with a written personalized care plan for preventive services.     Izora Ribas, NP   05/26/2018

## 2018-05-26 ENCOUNTER — Ambulatory Visit: Payer: Self-pay | Admitting: Adult Health

## 2018-05-26 ENCOUNTER — Ambulatory Visit (INDEPENDENT_AMBULATORY_CARE_PROVIDER_SITE_OTHER): Payer: Medicare Other | Admitting: Adult Health

## 2018-05-26 ENCOUNTER — Encounter: Payer: Self-pay | Admitting: Adult Health

## 2018-05-26 VITALS — BP 110/60 | HR 66 | Temp 97.7°F | Ht 73.5 in | Wt 203.0 lb

## 2018-05-26 DIAGNOSIS — F341 Dysthymic disorder: Secondary | ICD-10-CM | POA: Diagnosis not present

## 2018-05-26 DIAGNOSIS — E782 Mixed hyperlipidemia: Secondary | ICD-10-CM

## 2018-05-26 DIAGNOSIS — N320 Bladder-neck obstruction: Secondary | ICD-10-CM | POA: Diagnosis not present

## 2018-05-26 DIAGNOSIS — F988 Other specified behavioral and emotional disorders with onset usually occurring in childhood and adolescence: Secondary | ICD-10-CM | POA: Diagnosis not present

## 2018-05-26 DIAGNOSIS — E559 Vitamin D deficiency, unspecified: Secondary | ICD-10-CM | POA: Diagnosis not present

## 2018-05-26 DIAGNOSIS — Z0001 Encounter for general adult medical examination with abnormal findings: Secondary | ICD-10-CM | POA: Diagnosis not present

## 2018-05-26 DIAGNOSIS — Z0000 Encounter for general adult medical examination without abnormal findings: Secondary | ICD-10-CM

## 2018-05-26 DIAGNOSIS — I7300 Raynaud's syndrome without gangrene: Secondary | ICD-10-CM

## 2018-05-26 DIAGNOSIS — R6889 Other general symptoms and signs: Secondary | ICD-10-CM | POA: Diagnosis not present

## 2018-05-26 DIAGNOSIS — I480 Paroxysmal atrial fibrillation: Secondary | ICD-10-CM | POA: Diagnosis not present

## 2018-05-26 DIAGNOSIS — I739 Peripheral vascular disease, unspecified: Secondary | ICD-10-CM

## 2018-05-26 DIAGNOSIS — F319 Bipolar disorder, unspecified: Secondary | ICD-10-CM

## 2018-05-26 DIAGNOSIS — R7303 Prediabetes: Secondary | ICD-10-CM

## 2018-05-26 DIAGNOSIS — F17200 Nicotine dependence, unspecified, uncomplicated: Secondary | ICD-10-CM

## 2018-05-26 DIAGNOSIS — Z79899 Other long term (current) drug therapy: Secondary | ICD-10-CM

## 2018-05-26 DIAGNOSIS — R0989 Other specified symptoms and signs involving the circulatory and respiratory systems: Secondary | ICD-10-CM

## 2018-05-26 DIAGNOSIS — Z6826 Body mass index (BMI) 26.0-26.9, adult: Secondary | ICD-10-CM

## 2018-05-26 DIAGNOSIS — Z1211 Encounter for screening for malignant neoplasm of colon: Secondary | ICD-10-CM

## 2018-05-26 NOTE — Patient Instructions (Addendum)
Mr. Konigsberg , Thank you for taking time to come for your Medicare Wellness Visit. I appreciate your ongoing commitment to your health goals. Please review the following plan we discussed and let me know if I can assist you in the future.   These are the goals we discussed: Goals    . Cut out extra servings    . Increase physical activity    . Increase water intake    . Plan meals    . Quit Smoking       This is a list of the screening recommended for you and due dates:  Health Maintenance  Topic Date Due  .  Hepatitis C: One time screening is recommended by Center for Disease Control  (CDC) for  adults born from [birth year] through 1964.   20-Oct-1950  . Colon Cancer Screening  08/20/2001  . Tetanus Vaccine  07/01/2023  . Flu Shot  Completed  . Pneumonia vaccines  Completed     Know what a healthy weight is for you (roughly BMI <25) and aim to maintain this  Aim for 7+ servings of fruits and vegetables daily  65-80+ fluid ounces of water or unsweet tea for healthy kidneys  Limit to max 1 drink of alcohol per day; avoid smoking/tobacco  Limit animal fats in diet for cholesterol and heart health - choose grass fed whenever available  Avoid highly processed foods, and foods high in saturated/trans fats  Aim for low stress - take time to unwind and care for your mental health  Aim for 150 min of moderate intensity exercise weekly for heart health, and weights twice weekly for bone health  Aim for 7-9 hours of sleep daily     Prediabetes Eating Plan Prediabetes-also called impaired glucose tolerance or impaired fasting glucose-is a condition that causes blood sugar (blood glucose) levels to be higher than normal. Following a healthy diet can help to keep prediabetes under control. It can also help to lower the risk of type 2 diabetes and heart disease, which are increased in people who have prediabetes. Along with regular exercise, a healthy diet:  Promotes weight loss.  Helps to  control blood sugar levels.  Helps to improve the way that the body uses insulin.  What do I need to know about this eating plan?  Use the glycemic index (GI) to plan your meals. The index tells you how quickly a food will raise your blood sugar. Choose low-GI foods. These foods take a longer time to raise blood sugar.  Pay close attention to the amount of carbohydrates in the food that you eat. Carbohydrates increase blood sugar levels.  Keep track of how many calories you take in. Eating the right amount of calories will help you to achieve a healthy weight. Losing about 7 percent of your starting weight can help to prevent type 2 diabetes.  You may want to follow a Mediterranean diet. This diet includes a lot of vegetables, lean meats or fish, whole grains, fruits, and healthy oils and fats. What foods can I eat? Grains Whole grains, such as whole-wheat or whole-grain breads, crackers, cereals, and pasta. Unsweetened oatmeal. Bulgur. Barley. Quinoa. Brown rice. Corn or whole-wheat flour tortillas or taco shells. Vegetables Lettuce. Spinach. Peas. Beets. Cauliflower. Cabbage. Broccoli. Carrots. Tomatoes. Squash. Eggplant. Herbs. Peppers. Onions. Cucumbers. Brussels sprouts. Fruits Berries. Bananas. Apples. Oranges. Grapes. Papaya. Mango. Pomegranate. Kiwi. Grapefruit. Cherries. Meats and Other Protein Sources Seafood. Lean meats, such as chicken and [place] or lean cuts of pork  and beef. Tofu. Eggs. Nuts. Beans. Dairy Low-fat or fat-free dairy products, such as yogurt, cottage cheese, and cheese. Beverages Water. Tea. Coffee. Sugar-free or diet soda. Seltzer water. Milk. Milk alternatives, such as soy or almond milk. Condiments Mustard. Relish. Low-fat, low-sugar ketchup. Low-fat, low-sugar barbecue sauce. Low-fat or fat-free mayonnaise. Sweets and Desserts Sugar-free or low-fat pudding. Sugar-free or low-fat ice cream and other frozen treats. Fats and Oils Avocado. Walnuts. Olive  oil. The items listed above may not be a complete list of recommended foods or beverages. Contact your dietitian for more options. What foods are not recommended? Grains Refined white flour and flour products, such as bread, pasta, snack foods, and cereals. Beverages Sweetened drinks, such as sweet iced tea and soda. Sweets and Desserts Baked goods, such as cake, cupcakes, pastries, cookies, and cheesecake. The items listed above may not be a complete list of foods and beverages to avoid. Contact your dietitian for more information. This information is not intended to replace advice given to you by your health care provider. Make sure you discuss any questions you have with your health care provider. Document Released: 10/31/2014 Document Revised: 11/22/2015 Document Reviewed: 07/12/2014 Elsevier Interactive Patient Education  2017 [employer].

## 2018-05-27 LAB — COMPLETE METABOLIC PANEL WITH GFR
AG RATIO: 1.6 (calc) (ref 1.0–2.5)
ALBUMIN MSPROF: 4.5 g/dL (ref 3.6–5.1)
ALKALINE PHOSPHATASE (APISO): 67 U/L (ref 40–115)
ALT: 20 U/L (ref 9–46)
AST: 25 U/L (ref 10–35)
BILIRUBIN TOTAL: 0.7 mg/dL (ref 0.2–1.2)
BUN: 14 mg/dL (ref 7–25)
CHLORIDE: 102 mmol/L (ref 98–110)
CO2: 29 mmol/L (ref 20–32)
Calcium: 9.9 mg/dL (ref 8.6–10.3)
Creat: 0.94 mg/dL (ref 0.70–1.25)
GFR, Est African American: 98 mL/min/{1.73_m2} (ref >=60)
GFR, Est Non African American: 84 mL/min/{1.73_m2} (ref >=60)
GLOBULIN: 2.8 g/dL (ref 1.9–3.7)
GLUCOSE: 81 mg/dL (ref 65–99)
POTASSIUM: 4.4 mmol/L (ref 3.5–5.3)
Sodium: 138 mmol/L (ref 135–146)
Total Protein: 7.3 g/dL (ref 6.1–8.1)

## 2018-05-27 LAB — CBC WITH DIFFERENTIAL/PLATELET
BASOS PCT: 0.9 %
Basophils Absolute: 71 cells/uL (ref 0–200)
EOS ABS: 435 {cells}/uL (ref 15–500)
Eosinophils Relative: 5.5 %
HCT: 41.6 % (ref 38.5–50.0)
HEMOGLOBIN: 14.3 g/dL (ref 13.2–17.1)
Lymphs Abs: 1833 cells/uL (ref 850–3900)
MCH: 30.5 pg (ref 27.0–33.0)
MCHC: 34.4 g/dL (ref 32.0–36.0)
MCV: 88.7 fL (ref 80.0–100.0)
MONOS PCT: 8.6 %
MPV: 11.6 fL (ref 7.5–12.5)
Neutro Abs: 4882 cells/uL (ref 1500–7800)
Neutrophils Relative %: 61.8 %
PLATELETS: 163 10*3/uL (ref 140–400)
RBC: 4.69 10*6/uL (ref 4.20–5.80)
RDW: 13 % (ref 11.0–15.0)
TOTAL LYMPHOCYTE: 23.2 %
WBC mixed population: 679 cells/uL (ref 200–950)
WBC: 7.9 10*3/uL (ref 3.8–10.8)

## 2018-05-27 LAB — LIPID PANEL
CHOLESTEROL: 142 mg/dL (ref <200)
HDL: 47 mg/dL (ref >40)
LDL Cholesterol (Calc): 72 mg/dL (calc)
Non-HDL Cholesterol (Calc): 95 mg/dL (calc) (ref <130)
Total CHOL/HDL Ratio: 3 (calc) (ref <5.0)
Triglycerides: 149 mg/dL (ref <150)

## 2018-05-27 LAB — MAGNESIUM: Magnesium: 2.1 mg/dL (ref 1.5–2.5)

## 2018-05-27 LAB — TSH: TSH: 2.55 m[IU]/L (ref 0.40–4.50)

## 2018-05-27 LAB — HEMOGLOBIN A1C
EAG (MMOL/L): 6.8 (calc)
Hgb A1c MFr Bld: 5.9 % of total Hgb — ABNORMAL HIGH (ref <5.7)
MEAN PLASMA GLUCOSE: 123 (calc)

## 2018-05-27 LAB — VITAMIN D 25 HYDROXY (VIT D DEFICIENCY, FRACTURES): VIT D 25 HYDROXY: 54 ng/mL (ref 30–100)

## 2018-05-31 NOTE — Progress Notes (Signed)
Electrophysiology Office Note   Date:  06/01/2018   ID:  Bradley Ferguson, DOB Sep 02, 1950, MRN [id number]  PCP:  [name], MD  Cardiologist:  [name] Primary Electrophysiologist:  Constance Haw, MD    No chief complaint on file.    History of Present Illness: Bradley Ferguson is a 67 y.o. male who is being seen today for the evaluation of atrial fibrillation at the request of [name], MD. Presenting today for electrophysiology evaluation.  He has a history of peripheral artery disease, hyperlipidemia, 45-pack-year tobacco history.  He also has COPD.  He had an ablation in 2011 feels was due to atrial fibrillation.  He has been weak and fatigued over the last few weeks.  He was recently seen by his primary physician who found him to be in atrial fibrillation.  He otherwise feels well.  He continues to have some lower extremity discomfort but this is improved since his vascular intervention.  Today, denies symptoms of palpitations, chest pain, shortness of breath, orthopnea, PND, lower extremity edema, claudication, dizziness, presyncope, syncope, bleeding, or neurologic sequela. The patient is tolerating medications without difficulties.  Overall he is doing well.  He is noted no further episodes of atrial fibrillation.  He has no new complaints since starting dofetilide.   Past Medical History:  Diagnosis Date  . Anxiety   . Atrial fibrillation ([place])   . Blood transfusion without reported diagnosis   . Cataract   . Depression   . Emphysema of lung ([place])   . Heart murmur   . PVD (peripheral vascular disease) ([place])   . Raynaud's syndrome    Past Surgical History:  Procedure Laterality Date  . ATRIAL FIBRILLATION ABLATION    . EYE SURGERY    . FRACTURE SURGERY    . LOWER EXTREMITY INTERVENTION N/A 04/27/2017   Procedure: LOWER EXTREMITY INTERVENTION;  Surgeon: Lorretta Harp, MD;  Location: [facility];  Service: Cardiovascular;  Laterality: N/A;  .  PERIPHERAL VASCULAR ATHERECTOMY Right 04/27/2017   Procedure: PERIPHERAL VASCULAR ATHERECTOMY;  Surgeon: Lorretta Harp, MD;  Location: [facility];  Service: Cardiovascular;  Laterality: Right;  Iliac  . PERIPHERAL VASCULAR INTERVENTION Right 04/27/2017   Procedure: PERIPHERAL VASCULAR INTERVENTION;  Surgeon: Lorretta Harp, MD;  Location: [facility];  Service: Cardiovascular;  Laterality: Right;  Iliac     Current Outpatient Medications  Medication Sig Dispense Refill  . ALPRAZolam (XANAX) 1 MG tablet Take 1/2-1 tablet 2 - 3 x /day ONLY if needed for Anxiety Attack &  limit to 5 days /week to avoid addiction (Patient taking differently: Take 0.5-1 mg by mouth 3 (three) times daily as needed for anxiety. Take 1/2-1 tablet 2 - 3 x /day ONLY if needed for Anxiety Attack &  limit to 5 days /week to avoid addiction) 90 tablet 0  . atorvastatin (LIPITOR) 80 MG tablet Take 1 tablet daily for Cholesterol (Patient taking differently: Take 80 mg by mouth daily at 6 PM. ) 90 tablet 1  . CHOLECALCIFEROL PO Take 10,000 Units by mouth daily.    . clopidogrel (PLAVIX) 75 MG tablet Take 1 tablet daily for Circulation (Patient taking differently: Take 75 mg by mouth every morning. ) 90 tablet 1  . dofetilide (TIKOSYN) 500 MCG capsule Take 1 capsule (500 mcg total) by mouth 2 (two) times daily. 60 capsule 6  . rivaroxaban (XARELTO) 20 MG TABS tablet Take 1 tablet daily for Atrial Fibrillation (Patient taking differently:  Take 20 mg by mouth every evening. ) 30 tablet 11   No current facility-administered medications for this visit.     Allergies:   Effexor [venlafaxine]; Wellbutrin [bupropion]; and Zoloft [sertraline hcl]   Social History:  The patient  reports that he has been smoking cigarettes. He has a 30.00 pack-year smoking history. He has never used smokeless tobacco. He reports that he drinks about 4.0 standard drinks of alcohol per week. He reports that he does not use drugs.    Family History:  The patient's family history includes Cancer in his father and mother.   ROS:  Please see the history of present illness.   Otherwise, review of systems is positive for fatigue, leg pain, shortness of breath, muscle pain, rash, anxiety.   All other systems are reviewed and negative.   PHYSICAL EXAM: VS:  BP 112/68   Pulse 87   Ht 6' 1.5" (1.867 m)   Wt 194 lb 3.2 oz (88.1 kg)   SpO2 99%   BMI 25.27 kg/m  , BMI Body mass index is 25.27 kg/m. GEN: Well nourished, well developed, in no acute distress  HEENT: normal  Neck: no JVD, carotid bruits, or masses Cardiac: RRR; no murmurs, rubs, or gallops,no edema  Respiratory:  clear to auscultation bilaterally, normal work of breathing GI: soft, nontender, nondistended, + BS MS: no deformity or atrophy  Skin: warm and dry Neuro:  Strength and sensation are intact Psych: euthymic mood, full affect  EKG:  EKG is ordered today. Personal review of the ekg ordered shows this rhythm, right bundle branch block, rate 87  Recent Labs: 05/26/2018: ALT 20; BUN 14; Creat 0.94; Hemoglobin 14.3; Magnesium 2.1; Platelets 163; Potassium 4.4; Sodium 138; TSH 2.55    Lipid Panel     Component Value Date/Time   CHOL 142 05/26/2018 1152   CHOL 132 08/10/2017 1545   TRIG 149 05/26/2018 1152   HDL 47 05/26/2018 1152   HDL 44 08/10/2017 1545   CHOLHDL 3.0 05/26/2018 1152   VLDL 35 (H) 02/05/2017 1234   LDLCALC 72 05/26/2018 1152     Wt Readings from Last 3 Encounters:  06/01/18 194 lb 3.2 oz (88.1 kg)  05/26/18 203 lb (92.1 kg)  05/10/18 198 lb (89.8 kg)      Other studies Reviewed: Additional studies/ records that were reviewed today include: Epic notes   ASSESSMENT AND PLAN:  1.  Persistent atrial fibrillation: Currently on Xarelto with a recent admission to the hospital for dofetilide load.  Her main in sinus rhythm.  QTC has remained stable.  No changes.  This patients CHA2DS2-VASc Score and unadjusted Ischemic  Stroke Rate (% per year) is equal to 4.8 % stroke rate/year from a score of 4  Above score calculated as 1 point each if present [CHF, HTN, DM, Vascular=MI/PAD/Aortic Plaque, Age if 65-74, or Male] Above score calculated as 2 points each if present [Age > 75, or Stroke/TIA/TE]   2.  Hyperlipidemia: Currently on atorvastatin per primary cardiology  3.  Hypertension: Currently well controlled  4.  Peripheral arterial disease: Status post angiography in 2018 by Dr. Alvester Chou.  Work-up per primary cardiology.    Current medicines are reviewed at length with the patient today.   The patient does not have concerns regarding his medicines.  The following changes were made today: None  Labs/ tests ordered today include:  Orders Placed This Encounter  Procedures  . EKG 12-Lead    Disposition:   FU with Will Camnitz  6 months  Signed, [name], MD  06/01/2018 11:14 AM     [facility] [address] [place] [address] [phone] (office) [phone] (fax)

## 2018-06-01 ENCOUNTER — Encounter: Payer: Self-pay | Admitting: Cardiology

## 2018-06-01 ENCOUNTER — Ambulatory Visit (INDEPENDENT_AMBULATORY_CARE_PROVIDER_SITE_OTHER): Payer: Medicare Other | Admitting: Cardiology

## 2018-06-01 ENCOUNTER — Encounter (INDEPENDENT_AMBULATORY_CARE_PROVIDER_SITE_OTHER): Payer: Self-pay

## 2018-06-01 VITALS — BP 112/68 | HR 87 | Ht 73.5 in | Wt 194.2 lb

## 2018-06-01 DIAGNOSIS — I10 Essential (primary) hypertension: Secondary | ICD-10-CM

## 2018-06-01 DIAGNOSIS — E785 Hyperlipidemia, unspecified: Secondary | ICD-10-CM

## 2018-06-01 DIAGNOSIS — I739 Peripheral vascular disease, unspecified: Secondary | ICD-10-CM | POA: Diagnosis not present

## 2018-06-01 DIAGNOSIS — I4819 Other persistent atrial fibrillation: Secondary | ICD-10-CM

## 2018-06-01 NOTE — Patient Instructions (Signed)
Medication Instructions:  Your physician recommends that you continue on your current medications as directed. Please refer to the Current Medication list given to you today.  * If you need a refill on your cardiac medications before your next appointment, please call your pharmacy.   Labwork: None ordered *We will only notify you of abnormal results, otherwise continue current treatment plan.  Testing/Procedures: None ordered  Follow-Up: Your physician wants you to follow-up in: 6 month with Dr. Curt Bears.  You will receive a reminder letter in the mail two months in advance. If you don't receive a letter, please call our office to schedule the follow-up appointment.  *Please note that any paperwork needing to be filled out by the provider will need to be addressed at the front desk prior to seeing the provider. Please note that any FMLA, disability or other documents regarding health condition is subject to a $25.00 charge that must be received prior to completion of paperwork in the form of a money order or check.  Thank you for choosing [facility]!!   Trinidad Curet, RN [phone]  Any Other Special Instructions Will Be Listed Below (If Applicable).

## 2018-06-08 ENCOUNTER — Encounter: Payer: Self-pay | Admitting: Physician Assistant

## 2018-06-21 ENCOUNTER — Other Ambulatory Visit (HOSPITAL_COMMUNITY): Payer: Self-pay | Admitting: *Deleted

## 2018-06-21 MED ORDER — DOFETILIDE 500 MCG PO CAPS: 500.0000 ug | ORAL_CAPSULE | Freq: Two times a day (BID) | ORAL | 6 refills | Status: DC

## 2018-06-29 ENCOUNTER — Other Ambulatory Visit: Payer: Self-pay | Admitting: *Deleted

## 2018-06-29 ENCOUNTER — Other Ambulatory Visit: Payer: Self-pay | Admitting: Internal Medicine

## 2018-06-29 DIAGNOSIS — I480 Paroxysmal atrial fibrillation: Secondary | ICD-10-CM

## 2018-06-29 DIAGNOSIS — I739 Peripheral vascular disease, unspecified: Secondary | ICD-10-CM

## 2018-06-29 DIAGNOSIS — F419 Anxiety disorder, unspecified: Secondary | ICD-10-CM

## 2018-06-29 DIAGNOSIS — E782 Mixed hyperlipidemia: Secondary | ICD-10-CM

## 2018-06-29 MED ORDER — ATORVASTATIN CALCIUM 80 MG PO TABS: ORAL_TABLET | ORAL | 1 refills | Status: DC

## 2018-06-29 MED ORDER — CLOPIDOGREL BISULFATE 75 MG PO TABS: ORAL_TABLET | ORAL | 1 refills | Status: DC

## 2018-06-29 MED ORDER — RIVAROXABAN 20 MG PO TABS: ORAL_TABLET | ORAL | 11 refills | Status: DC

## 2018-06-29 MED ORDER — RIVAROXABAN 20 MG PO TABS: ORAL_TABLET | ORAL | 3 refills | Status: DC

## 2018-06-29 MED ORDER — ALPRAZOLAM 1 MG PO TABS: ORAL_TABLET | ORAL | 0 refills | Status: DC

## 2018-07-05 ENCOUNTER — Ambulatory Visit: Payer: Medicare Other | Admitting: Physician Assistant

## 2018-07-06 ENCOUNTER — Encounter: Payer: Self-pay | Admitting: Physician Assistant

## 2018-07-06 ENCOUNTER — Telehealth: Payer: Self-pay | Admitting: *Deleted

## 2018-07-06 ENCOUNTER — Ambulatory Visit (INDEPENDENT_AMBULATORY_CARE_PROVIDER_SITE_OTHER): Payer: PPO | Admitting: Physician Assistant

## 2018-07-06 VITALS — BP 120/70 | HR 74 | Ht 73.5 in | Wt 199.0 lb

## 2018-07-06 DIAGNOSIS — Z7901 Long term (current) use of anticoagulants: Secondary | ICD-10-CM

## 2018-07-06 DIAGNOSIS — Z1211 Encounter for screening for malignant neoplasm of colon: Secondary | ICD-10-CM

## 2018-07-06 NOTE — Telephone Encounter (Signed)
[facility] Pre-operative Risk Assessment     Request for surgical clearance:     Endoscopy Procedure  What type of surgery is being performed?     Colonoscopy  When is this surgery scheduled?     07-23-2018  What type of clearance is required ?   Pharmacy  Are there any medications that need to be held prior to surgery and how long? Plavix- Dr. Quay Burow manages the Plavix  Practice name and name of physician performing surgery?      [facility]  What is your office phone and fax number?      Phone- [phone]  [phone]  Anesthesia type (None, local, MAC, general) ?       MAC

## 2018-07-06 NOTE — Progress Notes (Signed)
Subjective:    Patient ID: Bradley Ferguson, male    DOB: [birth year], 68 y.o.   MRN: [id number]  HPI Bradley Ferguson is a pleasant 68 year old white male, new to GI today referred by Bradley Ferguson/NP/Dr. Jones Ferguson for screening colonoscopy.  Patient has not had any prior colon evaluation. Family history is negative for colon cancer and polyps as far as he is aware. He has no current GI complaints other than occasional loose stools he attributes to dietary indiscretion.  He says his diet generally is not all that good.  He has not noted any melena, occasionally may see a small spot of blood on the tissue especially if he has a hard stool.  He denies any abdominal pain No issues with heartburn indigestion dysphagia or odynophagia.  Patient has history of peripheral vascular disease and is status post right common iliac atherectomy and right common iliac stent with covered stent in October 2018 per Dr. Quay Ferguson.  He is maintained on Plavix. Patient also with history of chronic atrial fibrillation, and maintained on Xarelto per Dr. Marcene Ferguson. He also has history of COPD, ray nods, anxiety/depression, bipolar disorder, ADD.Marland Kitchen  Review of Systems Pertinent positive and negative review of systems were noted in the above HPI section.  All other review of systems was otherwise negative.  Outpatient Encounter Medications as of 07/06/2018  Medication Sig  . ALPRAZolam (XANAX) 1 MG tablet Take 1/2-1 tablet 2 - 3 x /day ONLY if needed for Anxiety Attack &  limit to 5 days /week to avoid addiction  . atorvastatin (LIPITOR) 80 MG tablet Take 1 tablet daily for Cholesterol  . CHOLECALCIFEROL PO Take 10,000 Units by mouth daily.  . clopidogrel (PLAVIX) 75 MG tablet Take 1 tablet daily for Circulation  . dofetilide (TIKOSYN) 500 MCG capsule Take 1 capsule (500 mcg total) by mouth 2 (two) times daily.  . rivaroxaban (XARELTO) 20 MG TABS tablet Take 1 tablet daily for Atrial Fibrillation   No facility-administered  encounter medications on file as of 07/06/2018.    Allergies  Allergen Reactions  . Effexor [Venlafaxine]     Feels bad  . Wellbutrin [Bupropion]     sleepy  . Zoloft [Sertraline Hcl]     Felt bad   Patient Active Problem List   Diagnosis Date Noted  . Visit for monitoring Tikosyn therapy 04/27/2018  . Claudication in peripheral vascular disease ([place]) 04/16/2017  . Labile hypertension 02/29/2016  . Prediabetes 02/29/2016  . BPH 02/29/2016  . Hyperlipidemia, mixed 11/12/2015  . Vitamin D deficiency 11/12/2015  . Bipolar I disorder ([place]) 08/13/2015  . Atrial fibrillation ([place]) 08/13/2015  . Attention deficit disorder 08/08/2010  . Dental caries 08/08/2010  . ANXIETY DEPRESSION 06/06/2010  . TOBACCO ABUSE 06/06/2010  . RAYNAUDS SYNDROME 06/06/2010   Social History   Socioeconomic History  . Marital status: Legally Separated    Spouse name: Not on file  . Number of children: 1  . Years of education: Not on file  . Highest education level: Not on file  Occupational History  . 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 Financial resource strain: Not on file  . Food insecurity:    Worry: Not on file    Inability: Not on file  . Transportation needs:    Medical: Not on file    Non-medical: Not on file  Tobacco Use  . Smoking status: Current Every Day Smoker    Packs/day: 0.25    Years: 40.00  Pack years: 10.00    Types: Cigarettes  . Smokeless tobacco: Never Used  Substance and Sexual Activity  . Alcohol use: Yes    Alcohol/week: 4.0 standard drinks    Types: 4 Standard drinks or equivalent per week    Comment: 3-4 drinks per week  . Drug use: Never  . Sexual activity: Not on file  Lifestyle  . Physical activity:    Days per week: Not on file    Minutes per session: Not on file  . Stress: Not on file  Relationships  . Social connections:    Talks on phone: Not on file    Gets together: Not on file    Attends religious service: Not on file     Active member of club or organization: Not on file    Attends meetings of clubs or organizations: Not on file    Relationship status: Not on file  . Intimate partner violence:    Fear of current or ex partner: Not on file    Emotionally abused: Not on file    Physically abused: Not on file    Forced sexual activity: Not on file  Other Topics Concern  . Not on file  Social History Narrative  . Not on file    Bradley Ferguson's family history includes Cancer in his father and mother.      Objective:    Vitals:   07/06/18 1036  BP: 120/70  Pulse: 74    Physical Exam; developed older white male in no acute distress, pleasant blood pressure 120/70 pulse 74, height 6 foot 1, weight 199, BMI 25.9.  HEENT; nontraumatic normocephalic EOMI PERRLA sclera anicteric mucosa moist, Cardiovascular; regular rate and rhythm with S1-S2, Pulmonary clear bilaterally, Abdomen ;soft, nontender nondistended bowel sounds are active no palpable mass or hepatosplenomegaly, Rectal; exam not done, Extremities; no clubbing cyanosis or edema skin warm and dry, Neuropsych; alert and oriented, grossly nonfocal mood and affect appropriate       Assessment & Plan:   #65 68 year old white male referred for screening colonoscopy, currently asymptomatic and average risk.  No prior colonoscopic evaluation  #2 chronic anticoagulation-on Xarelto for chronic atrial fibrillation 3.  Chronic antiplatelet therapy-on Plavix secondary to history of peripheral vascular disease and previous right common iliac stent October 2018 #4 ray nods 5.  Bipolar disorder 6.  ADD 7.  COPD-no oxygen use.  Plan; Patient will be scheduled for colonoscopy with Dr. Scarlette Shorts.  Procedure was discussed in detail with the patient including indications risks and benefits and he is agreeable to proceed. Patient will need to hold Plavix for 5 days prior to procedure, we will communicate with Dr. Quay Ferguson to assure this is reasonable for this  patient. He will also need to hold Xarelto for 24 to 48 hours prior to procedure, and we will communicate with Dr. Marcene Ferguson to assure this is reasonable. Rationale for holding antiplatelet therapy and anticoagulation were discussed with the patient, and we also discussed the small but real risk of thrombotic event while off of therapy.  Amsi Grimley S Meghana Tullo PA-C 07/06/2018   Cc: Liane Comber, NP

## 2018-07-06 NOTE — Patient Instructions (Signed)
You have been scheduled for a colonoscopy. Please follow written instructions given to you at your visit today.  We have provided you with the colonoscopy prep. If you use inhalers (even only as needed), please bring them with you on the day of your procedure.  We will call you with the Plavix and Xarelto clearance information for the procedure when get this this from Dr. [name] and Dr. Curt Bears.   Your physician has requested that you go to www.startemmi.com and enter the access code given to you at your visit today. This web site gives a general overview about your procedure. However, you should still follow specific instructions given to you by our office regarding your preparation for the procedure.

## 2018-07-06 NOTE — Telephone Encounter (Signed)
[facility] Pre-operative Risk Assessment     Request for surgical clearance:     Endoscopy Procedure  What type of surgery is being performed?     Colonoscopy  When is this surgery scheduled?     07-23-2018  What type of clearance is required ?   Pharmacy  Are there any medications that need to be held prior to surgery and how long? Xarelto- Dr. Ocie Doyne [facility]  Practice name and name of physician performing surgery?      [facility]  What is your office phone and fax number?      Phone- [phone]  [phone]  Anesthesia type (None, local, MAC, general) ?       MAC

## 2018-07-07 NOTE — Progress Notes (Signed)
Assessment and plan reviewed.  High risk patient given his comorbidities and dual antiplatelet oral anticoagulant therapies.  Supervising physicians of these problems and medications to assist with decision-making regarding holding these for routine screening versus proceeding on these with the adherent risks and limitations.  This will be communicated to the GI physician assistant for final direction.

## 2018-07-08 NOTE — Telephone Encounter (Signed)
Patient with diagnosis of atrial fibrillation on Xarelto for anticoagulation.    Procedure: colonoscopy Date of procedure: 07/23/2018  CHADS2-VASc score of  3 (, HTN, AGE, , CAD/PAD, )  CrCl 98.7 Platelet count 163  Per office protocol, patient can hold Xarelto for 2 days prior to procedure.    Patient should restart Xarelto on the evening of procedure or day after, at discretion of procedure MD

## 2018-07-08 NOTE — Telephone Encounter (Signed)
Dr. [name], can Plavix be held for colonoscopy.   Please route response back to P CV DIV PREOP    Also Routed to pharmacy regarding holding Xarelto

## 2018-07-11 NOTE — Telephone Encounter (Signed)
At the discretion of the performing MD

## 2018-07-12 NOTE — Telephone Encounter (Signed)
Can hold Plavix 1 week prior to procedure and restart afterwards.

## 2018-07-12 NOTE — Telephone Encounter (Signed)
Dr. [name], just to clarify your response below - message was sent to you to clear patient to hold Plavix and for how long (Xarelto part is per pharmacy). You follow patient for PAD - how long would you suggest they hold Plavix for colonoscopy? Thanks. Shellie Rogoff PA-C

## 2018-07-13 ENCOUNTER — Other Ambulatory Visit: Payer: Self-pay | Admitting: *Deleted

## 2018-07-13 NOTE — Telephone Encounter (Signed)
Lm for the patient to call me regarding the colonoscopy on 07-23-2018 with Dr. Henrene Pastor.  I have the Plavix and Xarelto clearance directions for him.

## 2018-07-13 NOTE — Telephone Encounter (Signed)
The patient called back and I informed him to hold the Plavix from 1-19 through 1-24. He can hold the Xarelto from 1-22 through 1-24.  The patient verbalized understanding directions. Thanked me for calling.

## 2018-07-15 ENCOUNTER — Encounter: Payer: Self-pay | Admitting: Internal Medicine

## 2018-07-23 ENCOUNTER — Encounter: Payer: Self-pay | Admitting: Internal Medicine

## 2018-07-23 ENCOUNTER — Ambulatory Visit (AMBULATORY_SURGERY_CENTER): Payer: PPO | Admitting: Internal Medicine

## 2018-07-23 ENCOUNTER — Encounter: Payer: PPO | Admitting: Internal Medicine

## 2018-07-23 VITALS — BP 111/78 | HR 61 | Temp 98.3°F | Resp 12 | Ht 73.0 in | Wt 199.0 lb

## 2018-07-23 DIAGNOSIS — Z1211 Encounter for screening for malignant neoplasm of colon: Secondary | ICD-10-CM | POA: Diagnosis not present

## 2018-07-23 DIAGNOSIS — D123 Benign neoplasm of transverse colon: Secondary | ICD-10-CM

## 2018-07-23 DIAGNOSIS — D120 Benign neoplasm of cecum: Secondary | ICD-10-CM | POA: Diagnosis not present

## 2018-07-23 DIAGNOSIS — D124 Benign neoplasm of descending colon: Secondary | ICD-10-CM

## 2018-07-23 DIAGNOSIS — D122 Benign neoplasm of ascending colon: Secondary | ICD-10-CM

## 2018-07-23 DIAGNOSIS — D125 Benign neoplasm of sigmoid colon: Secondary | ICD-10-CM

## 2018-07-23 DIAGNOSIS — D127 Benign neoplasm of rectosigmoid junction: Secondary | ICD-10-CM | POA: Diagnosis not present

## 2018-07-23 DIAGNOSIS — D128 Benign neoplasm of rectum: Secondary | ICD-10-CM

## 2018-07-23 DIAGNOSIS — K635 Polyp of colon: Secondary | ICD-10-CM | POA: Diagnosis not present

## 2018-07-23 DIAGNOSIS — D129 Benign neoplasm of anus and anal canal: Secondary | ICD-10-CM

## 2018-07-23 MED ORDER — SODIUM CHLORIDE 0.9 % IV SOLN: 500.0000 mL | Freq: Once | INTRAVENOUS | Status: DC

## 2018-07-23 NOTE — Progress Notes (Signed)
Called to room to assist during endoscopic procedure.  Patient ID and intended procedure confirmed with present staff. Received instructions for my participation in the procedure from the performing physician.  

## 2018-07-23 NOTE — Patient Instructions (Addendum)
YOU HAD AN ENDOSCOPIC PROCEDURE TODAY AT [facility]:   Refer to the procedure report that was given to you for any specific questions about what was found during the examination.  If the procedure report does not answer your questions, please call your gastroenterologist to clarify.  If you requested that your care partner not be given the details of your procedure findings, then the procedure report has been included in a sealed envelope for you to review at your convenience later.  YOU SHOULD EXPECT: Some feelings of bloating in the abdomen. Passage of more gas than usual.  Walking can help get rid of the air that was put into your GI tract during the procedure and reduce the bloating. If you had a lower endoscopy (such as a colonoscopy or flexible sigmoidoscopy) you may notice spotting of blood in your stool or on the toilet paper. If you underwent a bowel prep for your procedure, you may not have a normal bowel movement for a few days.  Please Note:  You might notice some irritation and congestion in your nose or some drainage.  This is from the oxygen used during your procedure.  There is no need for concern and it should clear up in a day or so.  SYMPTOMS TO REPORT IMMEDIATELY:   You will need a repeat colonoscopy in 6 months.   Read all of the handouts given to you by your recovery room nurse.       Following lower endoscopy (colonoscopy or flexible sigmoidoscopy):  Excessive amounts of blood in the stool  Significant tenderness or worsening of abdominal pains  Swelling of the abdomen that is new, acute  Fever of 100F or higher   For urgent or emergent issues, a gastroenterologist can be reached at any hour by calling [phone].   DIET:  We do recommend a small meal at first, but then you may proceed to your regular diet.  Drink plenty of fluids but you should avoid alcoholic beverages for 24 hours.  Try to eat a high fiber diet, and drink plenty of  water.  ACTIVITY:  You should plan to take it easy for the rest of today and you should NOT DRIVE or use heavy machinery until tomorrow (because of the sedation medicines used during the test).    FOLLOW UP: Our staff will call the number listed on your records the next business day following your procedure to check on you and address any questions or concerns that you may have regarding the information given to you following your procedure. If we do not reach you, we will leave a message.  However, if you are feeling well and you are not experiencing any problems, there is no need to return our call.  We will assume that you have returned to your regular daily activities without incident.  If any biopsies were taken you will be contacted by phone or by letter within the next 1-3 weeks.  Please call us at [phone] if you have not heard about the biopsies in 3 weeks.        You may start your plavix today, and your Xarelto tomorrow per Dr. Henrene Pastor.   SIGNATURES/CONFIDENTIALITY: You and/or your care partner have signed paperwork which will be entered into your electronic medical record.  These signatures attest to the fact that that the information above on your After Visit Summary has been reviewed and is understood.  Full responsibility of the confidentiality of this discharge information  lies with you and/or your care-partner.

## 2018-07-23 NOTE — Op Note (Signed)
[place] Patient Name: Bradley Ferguson Procedure Date: 07/23/2018 1:18 PM MRN: [id number] Endoscopist: Docia Chuck. Henrene Pastor , MD Age: 68 Referring MD:  Date of Birth: 02/16/1951 Gender: Male Account #: [id number] Procedure:                Colonoscopy with cold (16) and high (4) snare                            polypectomy. submuc. injection; Indications:              Screening for colorectal malignant neoplasm Medicines:                Monitored Anesthesia Care Procedure:                Pre-Anesthesia Assessment:                           - Prior to the procedure, a History and Physical                            was performed, and patient medications and                            allergies were reviewed. The patient's tolerance of                            previous anesthesia was also reviewed. The risks                            and benefits of the procedure and the sedation                            options and risks were discussed with the patient.                            All questions were answered, and informed consent                            was obtained. Prior Anticoagulants: The patient has                            taken Eliquis (apixaban) and Plavix, last dose was                            3 and 6 days respectively prior to procedure. ASA                            Grade Assessment: III - A patient with severe                            systemic disease. After reviewing the risks and                            benefits, the patient was deemed in satisfactory  condition to undergo the procedure.                           After obtaining informed consent, the colonoscope                            was passed under direct vision. Throughout the                            procedure, the patient's blood pressure, pulse, and                            oxygen saturations were monitored continuously. The   Colonoscope was introduced through the anus and                            advanced to the the cecum, identified by                            appendiceal orifice and ileocecal valve. The                            ileocecal valve, appendiceal orifice, and rectum                            were photographed. The quality of the bowel                            preparation was excellent. The colonoscopy was                            performed without difficulty. The patient tolerated                            the procedure well. The bowel preparation used was                            SUPREP. Scope In: 1:26:10 PM Scope Out: 2:15:45 PM Scope Withdrawal Time: 0 hours 44 minutes 47 seconds  Total Procedure Duration: 0 hours 49 minutes 35 seconds  Findings:                 The perianal and digital rectal examinations were                            normal. [Pertinent Negatives].                           TWENTY polyps were found in the rectum,                            recto-sigmoid colon, descending colon, transverse                            colon, ascending colon and cecum. The polyps were 2  to 25 mm in size. These polyps were removed with                            hot and cold snare techniques. The largest polyps                            were sessile and located in the distal transverse                            colon. These were raised with saline. Subsequently                            removed with hot snare. Between the polyps tattoo                            was placed for marking purposes. 2 large polyps                            that were pedunculated in the rectum and                            rectosigmoid region were also removed with hot                            snare. The multiple other polyps were removed with                            cold snare.Marland Kitchen Resection and retrieval were complete.                           Multiple diverticula were  found in the left colon.                           The exam was otherwise normal throughout the                            examined colon. Complications:            No immediate complications. Estimated blood loss:                            None. Estimated Blood Loss:     Estimated blood loss: none. Estimated blood loss:                            none. Impression:               - TWENTY 2 to 25 mm polyps in the rectum, at the                            recto-sigmoid colon, in the descending colon, in                            the transverse colon, in the ascending  colon and in                            the cecum, removed with cold and hot snare                            techniques as described. Resected and retrieved.                            Tattooed in 1 area.                           - Diverticulosis in the left colon. Recommendation:           - Repeat colonoscopy in 6 months for surveillance,                            if no cancer in resection specimens.                           - Patient has a contact number available for                            emergencies. He is at high risk for post                            polypectomy bleed and was advised as such . The                            signs and symptoms of potential delayed                            complications were discussed with the patient.                            Return to normal activities tomorrow. Written                            discharge instructions were provided to the patient.                           - Resume previous diet.                           - Resume Plavix today                           - Resume Eliquis tomorrow Docia Chuck. Henrene Pastor, MD 07/23/2018 2:44:44 PM This report has been signed electronically.

## 2018-07-23 NOTE — Progress Notes (Signed)
VSS. Report given to RN. No anesthetic complications noted

## 2018-07-26 ENCOUNTER — Telehealth: Payer: Self-pay

## 2018-07-26 NOTE — Telephone Encounter (Signed)
  Follow up Call-  Call back number 07/23/2018  Post procedure Call Back phone  # [phone]  Permission to leave phone message Yes  Some recent data might be hidden     Patient questions:  Do you have a fever, pain , or abdominal swelling? No. Pain Score  0 *  Have you tolerated food without any problems? Yes.    Have you been able to return to your normal activities? Yes.    Do you have any questions about your discharge instructions: Diet   No. Medications  No. Follow up visit  No.  Do you have questions or concerns about your Care? No.  Actions: * If pain score is 4 or above: No action needed, pain <4.

## 2018-07-29 ENCOUNTER — Encounter: Payer: Self-pay | Admitting: Internal Medicine

## 2018-08-26 ENCOUNTER — Encounter: Payer: Self-pay | Admitting: Internal Medicine

## 2018-08-26 ENCOUNTER — Ambulatory Visit (INDEPENDENT_AMBULATORY_CARE_PROVIDER_SITE_OTHER): Payer: PPO | Admitting: Internal Medicine

## 2018-08-26 VITALS — BP 122/80 | HR 68 | Temp 97.0°F | Resp 18 | Ht 73.0 in | Wt 195.4 lb

## 2018-08-26 DIAGNOSIS — R7303 Prediabetes: Secondary | ICD-10-CM

## 2018-08-26 DIAGNOSIS — R0989 Other specified symptoms and signs involving the circulatory and respiratory systems: Secondary | ICD-10-CM | POA: Diagnosis not present

## 2018-08-26 DIAGNOSIS — E559 Vitamin D deficiency, unspecified: Secondary | ICD-10-CM

## 2018-08-26 DIAGNOSIS — I480 Paroxysmal atrial fibrillation: Secondary | ICD-10-CM | POA: Diagnosis not present

## 2018-08-26 DIAGNOSIS — I739 Peripheral vascular disease, unspecified: Secondary | ICD-10-CM

## 2018-08-26 DIAGNOSIS — R7309 Other abnormal glucose: Secondary | ICD-10-CM

## 2018-08-26 DIAGNOSIS — Z79899 Other long term (current) drug therapy: Secondary | ICD-10-CM | POA: Diagnosis not present

## 2018-08-26 DIAGNOSIS — E782 Mixed hyperlipidemia: Secondary | ICD-10-CM | POA: Diagnosis not present

## 2018-08-26 MED ORDER — KETOCONAZOLE 2 % EX CREA: TOPICAL_CREAM | CUTANEOUS | 3 refills | Status: DC

## 2018-08-26 NOTE — Progress Notes (Signed)
This very nice 68 y.o. [name] presents for 3 month follow up with HTN, HLD, Pre-Diabetes and Vitamin D Deficiency.      Patient had recent Colonoscopy 07/23/2018 by Dr Henrene Pastor discovering #20 polyps thru-out the colon and recommended f/u Colon in 6 months.         Patient is treated for HTN (1996)  & BP has been controlled at home. Today's BP is at goal -  122/80.   In Mar 29011 he presented to the ER with Afib and was CV and again in Nov 2011 had a 2sd episode of Afib/FLUT and then had EPS/RFA by Dr Lovena Le. Patient also has ASPVD and had PCA/SAtenting /Artherotomies of his RLE in Oct 2018 by Dr Alvester Chou. In Nov 2019 , he was hospitalized with Afib (CHA2DS2-VASc of 4)  & converted with Tikosyn loading and also was started on Xarelto.  Patient has had no complaints of any cardiac type chest pain, palpitations, dyspnea / orthopnea / PND, dizziness, claudication, or dependent edema.     Hyperlipidemia is controlled with diet & meds. Patient denies myalgias or other med SE's. Last Lipids were  Lab Results  Component Value Date   CHOL 142 05/26/2018   HDL 47 05/26/2018   LDLCALC 72 05/26/2018   TRIG 149 05/26/2018   CHOLHDL 3.0 05/26/2018      Also, the patient has history of PreDiabetes and has had no symptoms of reactive hypoglycemia, diabetic polys, paresthesias or visual blurring.  Last A1c was not at goal: Lab Results  Component Value Date   HGBA1C 5.9 (H) 05/26/2018      Further, the patient also has history of Vitamin D Deficiency ("28" / 2017)   and supplements vitamin D without any suspected side-effects. Last vitamin D was not at goal (70-100):   Lab Results  Component Value Date   VD25OH 54 05/26/2018   Current Outpatient Medications on File Prior to Visit  Medication Sig  . ALPRAZolam (XANAX) 1 MG tablet Take 1/2-1 tablet 2 - 3 x /day ONLY if needed for Anxiety Attack &  limit to 5 days /week to avoid addiction  . atorvastatin (LIPITOR) 80 MG tablet Take 1 tablet daily for  Cholesterol  . CHOLECALCIFEROL PO Take 10,000 Units by mouth daily.  . clopidogrel (PLAVIX) 75 MG tablet Take 1 tablet daily for Circulation  . dofetilide (TIKOSYN) 500 MCG capsule Take 1 capsule (500 mcg total) by mouth 2 (two) times daily.  . rivaroxaban (XARELTO) 20 MG TABS tablet Take 1 tablet daily for Atrial Fibrillation   No current facility-administered medications on file prior to visit.    Allergies  Allergen Reactions  . Effexor [Venlafaxine]     Feels bad  . Wellbutrin [Bupropion]     sleepy  . Zoloft [Sertraline Hcl]     Felt bad   PMHx:   Past Medical History:  Diagnosis Date  . Anxiety   . Atrial fibrillation ([place])   . Blood transfusion without reported diagnosis   . Cataract   . Depression   . Emphysema of lung ([place])   . Heart murmur   . Hyperlipidemia   . PVD (peripheral vascular disease) ([place])   . Raynaud's syndrome    Immunization History  Administered Date(s) Administered  . Influenza Whole 04/30/2010  . Influenza, High Dose Seasonal PF 03/20/2017, 03/29/2018  . Pneumococcal Conjugate-13 02/05/2017  . Pneumococcal Polysaccharide-23 04/30/2010, 03/29/2018  . Td 06/30/2013   Past Surgical History:  Procedure Laterality Date  .  ATRIAL FIBRILLATION ABLATION    . CATARACT EXTRACTION Right   . FRACTURE SURGERY    . LOWER EXTREMITY INTERVENTION N/A 04/27/2017   Procedure: LOWER EXTREMITY INTERVENTION;  Surgeon: Lorretta Harp, MD;  Location: [facility];  Service: Cardiovascular;  Laterality: N/A;  . PERIPHERAL VASCULAR ATHERECTOMY Right 04/27/2017   Procedure: PERIPHERAL VASCULAR ATHERECTOMY;  Surgeon: Lorretta Harp, MD;  Location: [facility];  Service: Cardiovascular;  Laterality: Right;  Iliac  . PERIPHERAL VASCULAR INTERVENTION Right 04/27/2017   Procedure: PERIPHERAL VASCULAR INTERVENTION;  Surgeon: Lorretta Harp, MD;  Location: [facility];  Service: Cardiovascular;  Laterality: Right;  Iliac   FHx:    Reviewed /  unchanged  SHx:    Reviewed / unchanged   Systems Review:  Constitutional: Denies fever, chills, wt changes, headaches, insomnia, fatigue, night sweats, change in appetite. Eyes: Denies redness, blurred vision, diplopia, discharge, itchy, watery eyes.  ENT: Denies discharge, congestion, post nasal drip, epistaxis, sore throat, earache, hearing loss, dental pain, tinnitus, vertigo, sinus pain, snoring.  CV: Denies chest pain, palpitations, irregular heartbeat, syncope, dyspnea, diaphoresis, orthopnea, PND, claudication or edema. Respiratory: denies cough, dyspnea, DOE, pleurisy, hoarseness, laryngitis, wheezing.  Gastrointestinal: Denies dysphagia, odynophagia, heartburn, reflux, water brash, abdominal pain or cramps, nausea, vomiting, bloating, diarrhea, constipation, hematemesis, melena, hematochezia  or hemorrhoids. Genitourinary: Denies dysuria, frequency, urgency, nocturia, hesitancy, discharge, hematuria or flank pain. Musculoskeletal: Denies arthralgias, myalgias, stiffness, jt. swelling, pain, limping or strain/sprain.  Skin: Denies pruritus, rash, hives, warts, acne, eczema or change in skin lesion(s). Neuro: No weakness, tremor, incoordination, spasms, paresthesia or pain. Psychiatric: Denies confusion, memory loss or sensory loss. Endo: Denies change in weight, skin or hair change.  Heme/Lymph: No excessive bleeding, bruising or enlarged lymph nodes.  Physical Exam  BP 122/80   Pulse 68   Temp (!) 97 F (36.1 C)   Resp 18   Ht 6\' 1"  (1.854 m)   Wt 195 lb 6.4 oz (88.6 kg)   BMI 25.78 kg/m   Appears  well nourished, well groomed  and in no distress.  Eyes: PERRLA, EOMs, conjunctiva no swelling or erythema. Sinuses: No frontal/maxillary tenderness ENT/Mouth: EAC's clear, TM's nl w/o erythema, bulging. Nares clear w/o erythema, swelling, exudates. Oropharynx clear without erythema or exudates. Oral hygiene is good. Tongue normal, non obstructing. Hearing intact.  Neck:  Supple. Thyroid not palpable. Car 2+/2+ without bruits, nodes or JVD. Chest: Respirations nl with BS clear & equal w/o rales, rhonchi, wheezing or stridor.  Cor: Heart sounds normal w/ regular rate and rhythm without sig. murmurs, gallops, clicks or rubs. Peripheral pulses normal and equal  without edema.  Abdomen: Soft & bowel sounds normal. Non-tender w/o guarding, rebound, hernias, masses or organomegaly.  Lymphatics: Unremarkable.  Musculoskeletal: Full ROM all peripheral extremities, joint stability, 5/5 strength and normal gait.  Skin: Warm, dry without exposed rashes, lesions or ecchymosis apparent.  Neuro: Cranial nerves intact, reflexes equal bilaterally. Sensory-motor testing grossly intact. Tendon reflexes grossly intact.  Pysch: Alert & oriented x 3.  Insight and judgement nl & appropriate. No ideations.  Assessment and Plan:  1. Labile hypertension  - Continue medication, monitor blood pressure at home.  - Continue DASH diet.  Reminder to go to the ER if any CP,  SOB, nausea, dizziness, severe HA, changes vision/speech.  - CBC with Differential/Platelet - COMPLETE METABOLIC PANEL WITH GFR - Magnesium - TSH  2. Hyperlipidemia, mixed  - Continue diet/meds, exercise,& lifestyle modifications.  - Continue  monitor periodic cholesterol/liver & renal functions   - Lipid panel - TSH  3. Abnormal glucose  - Continue diet, exercise  - Lifestyle modifications.  - Monitor appropriate labs.   - Hemoglobin A1c - Insulin, random  4. Vitamin D deficiency  - Continue supplementation.   - VITAMIN D 25 Hydroxyl  5. Paroxysmal atrial fibrillation (HCC)  - TSH  6. Prediabetes  - Continue diet, exercise  - Lifestyle modifications.  - Monitor appropriate labs.  - Hemoglobin A1c - Insulin, random  7. Claudication in peripheral vascular disease ([place])  - Lipid panel  8. Medication management  - CBC with Differential/Platelet - COMPLETE METABOLIC PANEL WITH GFR -  Magnesium - Lipid panel - TSH - Hemoglobin A1c - Insulin, random - VITAMIN D 25 Hydroxl      Discussed  regular exercise, BP monitoring, weight control to achieve/maintain BMI less than 25 and discussed med and SE's. Recommended labs to assess and monitor clinical status with further disposition pending results of labs. Over 30 minutes of exam, counseling, chart review was performed.

## 2018-08-26 NOTE — Patient Instructions (Signed)

## 2018-08-27 LAB — COMPLETE METABOLIC PANEL WITH GFR
AG Ratio: 1.7 (calc) (ref 1.0–2.5)
ALT: 19 U/L (ref 9–46)
AST: 23 U/L (ref 10–35)
Albumin: 4.7 g/dL (ref 3.6–5.1)
Alkaline phosphatase (APISO): 65 U/L (ref 35–144)
BUN: 14 mg/dL (ref 7–25)
CO2: 24 mmol/L (ref 20–32)
Calcium: 9.8 mg/dL (ref 8.6–10.3)
Chloride: 106 mmol/L (ref 98–110)
Creat: 1.05 mg/dL (ref 0.70–1.25)
GFR, Est African American: 85 mL/min/{1.73_m2} (ref >=60)
GFR, Est Non African American: 73 mL/min/{1.73_m2} (ref >=60)
Globulin: 2.7 g/dL (calc) (ref 1.9–3.7)
Glucose, Bld: 103 mg/dL — ABNORMAL HIGH (ref 65–99)
Potassium: 4.1 mmol/L (ref 3.5–5.3)
Sodium: 140 mmol/L (ref 135–146)
Total Bilirubin: 0.8 mg/dL (ref 0.2–1.2)
Total Protein: 7.4 g/dL (ref 6.1–8.1)

## 2018-08-27 LAB — CBC WITH DIFFERENTIAL/PLATELET
Absolute Monocytes: 584 cells/uL (ref 200–950)
Basophils Absolute: 58 cells/uL (ref 0–200)
Basophils Relative: 0.8 %
Eosinophils Absolute: 307 cells/uL (ref 15–500)
Eosinophils Relative: 4.2 %
HCT: 40.1 % (ref 38.5–50.0)
Hemoglobin: 13.9 g/dL (ref 13.2–17.1)
Lymphs Abs: 1847 cells/uL (ref 850–3900)
MCH: 31.1 pg (ref 27.0–33.0)
MCHC: 34.7 g/dL (ref 32.0–36.0)
MCV: 89.7 fL (ref 80.0–100.0)
MPV: 12.1 fL (ref 7.5–12.5)
Monocytes Relative: 8 %
Neutro Abs: 4504 cells/uL (ref 1500–7800)
Neutrophils Relative %: 61.7 %
Platelets: 166 10*3/uL (ref 140–400)
RBC: 4.47 10*6/uL (ref 4.20–5.80)
RDW: 13.1 % (ref 11.0–15.0)
Total Lymphocyte: 25.3 %
WBC: 7.3 10*3/uL (ref 3.8–10.8)

## 2018-08-27 LAB — HEMOGLOBIN A1C
HEMOGLOBIN A1C: 5.9 %{Hb} — AB (ref <5.7)
Mean Plasma Glucose: 123 (calc)
eAG (mmol/L): 6.8 (calc)

## 2018-08-27 LAB — VITAMIN D 25 HYDROXY (VIT D DEFICIENCY, FRACTURES): VIT D 25 HYDROXY: 49 ng/mL (ref 30–100)

## 2018-08-27 LAB — LIPID PANEL
Cholesterol: 135 mg/dL (ref <200)
HDL: 41 mg/dL (ref >=40)
LDL CHOLESTEROL (CALC): 72 mg/dL
Non-HDL Cholesterol (Calc): 94 mg/dL (calc) (ref <130)
Total CHOL/HDL Ratio: 3.3 (calc) (ref <5.0)
Triglycerides: 132 mg/dL (ref <150)

## 2018-08-27 LAB — TSH: TSH: 2.32 m[IU]/L (ref 0.40–4.50)

## 2018-08-27 LAB — MAGNESIUM: MAGNESIUM: 2 mg/dL (ref 1.5–2.5)

## 2018-08-27 LAB — INSULIN, RANDOM: Insulin: 10.3 u[IU]/mL

## 2018-08-29 ENCOUNTER — Encounter: Payer: Self-pay | Admitting: Internal Medicine

## 2018-09-02 ENCOUNTER — Ambulatory Visit (INDEPENDENT_AMBULATORY_CARE_PROVIDER_SITE_OTHER): Payer: PPO | Admitting: Internal Medicine

## 2018-09-02 VITALS — BP 122/60 | HR 84 | Temp 97.2°F | Resp 16 | Ht 73.0 in | Wt 192.8 lb

## 2018-09-02 DIAGNOSIS — L820 Inflamed seborrheic keratosis: Secondary | ICD-10-CM

## 2018-09-02 DIAGNOSIS — B079 Viral wart, unspecified: Secondary | ICD-10-CM

## 2018-09-02 DIAGNOSIS — R0989 Other specified symptoms and signs involving the circulatory and respiratory systems: Secondary | ICD-10-CM

## 2018-09-02 DIAGNOSIS — I480 Paroxysmal atrial fibrillation: Secondary | ICD-10-CM | POA: Diagnosis not present

## 2018-09-05 ENCOUNTER — Encounter: Payer: Self-pay | Admitting: Internal Medicine

## 2018-09-05 NOTE — Progress Notes (Signed)
  Subjective:    Patient ID: Bradley Ferguson, male    DOB: May 14, 1951, 68 y.o.   MRN: [id number]  HPI    Patient is a very nice 68 yo [name] with hx/o labile HTN & pAfib who presents for recheck. He denies recent c/o HA's, dizziness, CP, palpitations, dyspnea ofr edema    He does have concerns re: a tender pruritic pigmented skin lesion on his back and also has an apparent wart on the margin of his Rt lower eyelid.   Medication Sig  . ALPRAZolam (XANAX) 1 MG tablet Take 1/2-1 tablet 2 - 3 x /day ONLY if needed for Anxiety Attack &  limit to 5 days /week to avoid addiction  . atorvastatin (LIPITOR) 80 MG tablet Take 1 tablet daily for Cholesterol  . CHOLECALCIFEROL PO Take 10,000 Units by mouth daily.  . clopidogrel (PLAVIX) 75 MG tablet Take 1 tablet daily for Circulation  . dofetilide (TIKOSYN) 500 MCG capsule Take 1 capsule (500 mcg total) by mouth 2 (two) times daily.  Marland Kitchen ketoconazole (NIZORAL) 2 % cream Apply to rash 1 to 2 x /day  . rivaroxaban (XARELTO) 20 MG TABS tablet Take 1 tablet daily for Atrial Fibrillation   Allergies  Allergen Reactions  . Effexor [Venlafaxine]     Feels bad  . Wellbutrin [Bupropion]     sleepy  . Zoloft [Sertraline Hcl]     Felt bad   Review of Systems   10 point systems review negative except as above.    Objective:   Physical Exam   BP 122/60   Pulse 84   Temp (!) 97.2 F (36.2 C)   Resp 16   Ht 6\' 1"  (1.854 m)   Wt 192 lb 12.8 oz (87.5 kg)   BMI 25.44 kg/m   HEENT - WNL. Neck - supple.  Chest - Clear equal BS. Cor - Nl HS. RRR w/o sig MGR. PP 1(+). No edema. MS- FROM w/o deformities.  Gait Nl. Neuro -  Nl w/o focal abnormalities. Skin - there is an inflamed 10 mm x 12 mm medium brown raised crusty lesion of his mid back and also is noted to have a filamentous wart along the upper margin of his Rt lower eyelid.   Procedure(s) (CPT 17000 and 17003)     After informed consent the lesion on his back was treated with liquid Nitrogen by a  triple freeze thaw technique. The lesion on the lower eye lid was treated in a similar manner  with a cotton tip swab & retracting the lower lid downward as far as possible and application.     Assessment & Plan:   1. Labile hypertension  2. Paroxysmal atrial fibrillation (HCC)  3. Seborrheic keratoses, inflamed  4. Wart of face

## 2018-12-06 DIAGNOSIS — K635 Polyp of colon: Secondary | ICD-10-CM | POA: Insufficient documentation

## 2018-12-06 NOTE — Progress Notes (Signed)
MEDICARE ANNUAL WELLNESS VISIT AND FOLLOW UP Assessment:    Bradley Ferguson was seen today for follow-up and medicare wellness.  Diagnoses and all orders for this visit:  Encounter for Medicare annual wellness exam  Labile hypertension Continue medication Monitor blood pressure at home; call if consistently over 130/80 Continue DASH diet.   Reminder to go to the ER if any CP, SOB, nausea, dizziness, severe HA, changes vision/speech, left arm numbness and tingling and jaw pain.  Paroxysmal atrial fibrillation (HCC) Rate controlled today, doing well on tikosyn, xarelto  Has followed up with cardiology for tikosyn levels Followed by Dr. [name]   Raynaud's disease without gangrene Improved recently, continue to monitor   BPH Recently well controlled, denies symptoms  Vitamin D deficiency Continue supplementation- taking 10000 IU daily  Check vitamin D level -     VITAMIN D 25 Hydroxy (Vit-D Deficiency, Fractures)  TOBACCO ABUSE Discussed risks associated with tobacco use and advised to reduce or quit Patient is ready to do so and plans to slow taper Declines Chantix or other medication Will follow up at the next visit -lung cancer screening with low dose CT discussed as recommended by guidelines based on age, number of pack year history.  Discussed risks of screening including but not limited to false positives on xray, further testing or consultation with specialist, and possible false negative CT as well. Understanding expressed and wishes to proceed with CT testing. Order placed.    Prediabetes Discussed disease and risks Discussed diet/exercise, weight management  Reminded to schedule appointment with ophthalmology -     Hemoglobin A1c  Hyperlipidemia, mixed Continue medications: newly on atorvastatin 80 mg, ? Arthralgias, will trial off for 2 weeks and call back, consider switch to rosuvastatin Continue low cholesterol diet and exercise.  Check lipid panel.  -     Lipid  panel -     TSH  Claudication in peripheral vascular disease (HCC) Followed by Dr. [name], s/p stent, continued claudication Continue plavix, cholesterol Recommended to quit smoking  Bipolar I disorder ([place]) Currently stable off of medications Lifestyle discussed: diet/exerise, sleep hygiene, stress management, hydration  Attention deficit disorder, unspecified hyperactivity presence Currently stable, doing well at current job off of medications  ANXIETY DEPRESSION Well managed by current regimen; continue medications- using benzo sparingly Stress management techniques discussed, increase water, good sleep hygiene discussed, increase exercise, and increase veggies.   BMI 26.0-26.9,adult Long discussion about weight loss, diet, and exercise Recommended diet heavy in fruits and veggies and low in animal meats, cheeses, and dairy products, appropriate calorie intake Discussed appropriate weight for height Follow up at next visit  Medication management -     CBC with Differential/Platelet -     COMPLETE METABOLIC PANEL WITH GFR -     Magnesium  Colon polyps Recommended 6 month follow up colonoscopy due to numerous polyps, due 12/2018, phone number given  Over 30 minutes of exam, counseling, chart review, and critical decision making was performed  Future Appointments  Date Time Provider [place]  12/10/2018  2:15 PM Lorretta Harp, MD [facility]  12/21/2018  9:00 AM [facility] 3 [facility]  12/21/2018 10:00 AM [facility]  03/28/2019  2:00 PM [name], MD [facility] None     Plan:   During the course of the visit the patient was educated and counseled about appropriate screening and preventive services including:    Pneumococcal vaccine   Influenza vaccine  Prevnar 13  Td  vaccine  Screening electrocardiogram  Colorectal cancer screening  Diabetes screening  Glaucoma screening  Nutrition counseling     Subjective:  Bradley Ferguson is a 68 y.o. male who presents for Medicare Annual Wellness Visit and 3 month follow up for HTN, hyperlipidemia, prediabetes, and vitamin D Def.   Hx/o pAfib s/p ablation in 2011 and was seen in 2019 for CPE and was back in Afib and started on Xarelto, and was admitted for initiation of Tikosyn with successful conversion in Oct 2019. He is followed by Dr. Rayann Heman and also Dr. [name] for PVD with claudification, peripheral angiography on  04/27/17 revealing a 99% calcified eccentric proximal right common iliac artery stenosis and bilateral long segment SFA chronic total occlusions which were highly calcified. Pt underwent direct back orbital rotational atherectomy, PTCA and cover stenting using a 7 mm x 39 mm long VBX covered stent.  He does have hx of mixed bipolar type depression with ADD and anxiety though in recently years has been apparently stable on xanax, recently taking rarely, averages 1/2 tab 2-3 times a week. He reports doing well with current regimen.   he currently continues to smoke 0.75 pack a day; discussed risks associated with smoking, patient is not ready to quit, but slowly tapering down. He gets CXR through work every 2 years, has never had CT screening. Has ~50 pack year history.   He had first colonoscopy 06/2018 with numerous adenomatous polyps of colon ~20, has been recommended a 6 month follow up colonoscopy by Dr. Henrene Pastor.  BMI is Body mass index is 24.88 kg/m., he has been working on diet, exercise limited by cardiovascular disease (PAD/claudication).  Wt Readings from Last 3 Encounters:  12/07/18 188 lb 9.6 oz (85.5 kg)  09/02/18 192 lb 12.8 oz (87.5 kg)  08/26/18 195 lb 6.4 oz (88.6 kg)   His blood pressure has been controlled at home, today their BP is BP: 128/64 He does not workout. He denies chest pain, shortness of breath, dizziness.   He is on cholesterol medication (atorvastatin 80 mg daily) and denies myalgias. His cholesterol is  at goal. The cholesterol last visit was:   Lab Results  Component Value Date   CHOL 135 08/26/2018   HDL 41 08/26/2018   LDLCALC 72 08/26/2018   TRIG 132 08/26/2018   CHOLHDL 3.3 08/26/2018   He has not been working on diet and exercise for prediabetes, and denies increased appetite, nausea, polydipsia, polyuria, visual disturbances, vomiting and weight loss. Last A1C in the office was:  Lab Results  Component Value Date   HGBA1C 5.9 (H) 08/26/2018   Last GFR Lab Results  Component Value Date   GFRNONAA 73 08/26/2018   Patient is on Vitamin D supplement, taking 10000 IU daily    Lab Results  Component Value Date   VD25OH 49 08/26/2018      Medication Review:   Current Outpatient Medications (Cardiovascular):  .  atorvastatin (LIPITOR) 80 MG tablet, Take 1 tablet daily for Cholesterol .  dofetilide (TIKOSYN) 500 MCG capsule, Take 1 capsule (500 mcg total) by mouth 2 (two) times daily.    Current Outpatient Medications (Hematological):  .  clopidogrel (PLAVIX) 75 MG tablet, Take 1 tablet daily for Circulation .  rivaroxaban (XARELTO) 20 MG TABS tablet, Take 1 tablet daily for Atrial Fibrillation  Current Outpatient Medications (Other):  Marland Kitchen  ALPRAZolam (XANAX) 1 MG tablet, Take 1/2-1 tablet 2 - 3 x /day ONLY if needed for Anxiety Attack &  limit to  5 days /week to avoid addiction .  CHOLECALCIFEROL PO, Take 10,000 Units by mouth daily. Marland Kitchen  ketoconazole (NIZORAL) 2 % cream, Apply to rash 1 to 2 x /day  Allergies: Allergies  Allergen Reactions  . Effexor [Venlafaxine]     Feels bad  . Wellbutrin [Bupropion]     sleepy  . Zoloft [Sertraline Hcl]     Felt bad    Current Problems (verified) has ANXIETY DEPRESSION; TOBACCO ABUSE; RAYNAUDS SYNDROME; Attention deficit disorder; Dental caries; Bipolar I disorder ([place]); Atrial fibrillation ([place]); Hyperlipidemia, mixed; Vitamin D deficiency; Labile hypertension; Other abnormal glucose (prediabetes); BPH; Claudication in  peripheral vascular disease ([place]); Visit for monitoring Tikosyn therapy; and Colon polyps on their problem list.  Screening Tests Immunization History  Administered Date(s) Administered  . Influenza Whole 04/30/2010  . Influenza, High Dose Seasonal PF 03/20/2017, 03/29/2018  . Pneumococcal Conjugate-13 02/05/2017  . Pneumococcal Polysaccharide-23 04/30/2010, 03/29/2018  . Td 06/30/2013    Preventative care: Last colonoscopy: 06/2018, multiple polyps, follow up 6 months, he will call Dr. Henrene Pastor  CXR: 2011, getting every 2 years through work, reports requested  Prior vaccinations: TD or Tdap: 2015  Influenza: 02/2018  Pneumococcal: 2019 Prevnar13: 2018 Shingles/Zostavax: declines   Names of Other Physician/Practitioners you currently use: 1. [facility] here for primary care 2. Dr. Katy Fitch, eye doctor, last visit years ago, needs to schedule follow up for L cataract 3. Full dentures   Patient Care Team: [name], MD as PCP - General (Internal Medicine) Constance Haw, MD as PCP - Electrophysiology (Cardiology) Lorretta Harp, MD as Consulting Physician (Cardiology) Irene Shipper, MD as Consulting Physician (Gastroenterology)  Surgical: He  has a past surgical history that includes Fracture surgery; Atrial fibrillation ablation; LOWER EXTREMITY INTERVENTION (N/A, 04/27/2017); PERIPHERAL VASCULAR INTERVENTION (Right, 04/27/2017); PERIPHERAL VASCULAR ATHERECTOMY (Right, 04/27/2017); and Cataract extraction (Right). Family His family history includes Cancer in his father and mother. Social history  He reports that he has been smoking cigarettes. He started smoking about 49 years ago. He has a 49.00 pack-year smoking history. He has never used smokeless tobacco. He reports current alcohol use of about 4.0 standard drinks of alcohol per week. He reports that he does not use drugs.  MEDICARE WELLNESS OBJECTIVES: Physical activity:  Current Exercise Habits: The patient does not participate in regular exercise at present, Exercise limited by: cardiac condition(s);Other - see comments(vascular) Cardiac risk factors: Cardiac Risk Factors include: advanced age (>37men, >17 women);hypertension;male gender;dyslipidemia;sedentary lifestyle;smoking/ tobacco exposure Depression/mood screen:   Depression screen [facility] 2/9 12/07/2018  Decreased Interest 0  Down, Depressed, Hopeless 1  PHQ - 2 Score 1  Altered sleeping -  Tired, decreased energy -  Change in appetite -  Feeling bad or failure about yourself  -  Trouble concentrating -  Moving slowly or fidgety/restless -  Suicidal thoughts -  PHQ-9 Score -  Difficult doing work/chores -    ADLs:  In your present state of health, do you have any difficulty performing the following activities: 12/07/2018 08/29/2018  Hearing? N N  Vision? Y N  Comment L eye cataract, due for surgery, needs to see Dr. Katy Fitch -  Difficulty concentrating or making decisions? N N  Walking or climbing stairs? Y N  Comment PAD, followed by Dr. [name]  -  Dressing or bathing? N N  Doing errands, shopping? N N  Some recent data might be hidden     Cognitive Testing  Alert? Yes  Normal Appearance?Yes  Oriented to person? Yes  Place? Yes   Time? Yes  Recall of three objects?  Yes  Can perform simple calculations? Yes  Displays appropriate judgment?Yes  Can read the correct time from a watch face?Yes  EOL planning: Does Patient Have a Medical Advance Directive?: No Would patient like information on creating a medical advance directive?: Yes (MAU/Ambulatory/Procedural Areas - Information given)   Objective:   Today's Vitals   12/07/18 1437  BP: 128/64  Pulse: (!) 102  Temp: (!) 97.5 F (36.4 C)  SpO2: 97%  Weight: 188 lb 9.6 oz (85.5 kg)  Height: 6\' 1"  (1.854 m)   Body mass index is 24.88 kg/m.  General appearance: alert, no distress, WD/WN, male HEENT: normocephalic, sclerae anicteric, TMs  pearly, nares patent, no discharge or erythema, pharynx normal Oral cavity: MMM, no lesions Neck: supple, no lymphadenopathy, no thyromegaly, no masses Heart: RRR, normal S1, S2, no murmurs Lungs: CTA bilaterally, mildly coarse over bronchioles anteriorly, no wheezes, rhonchi, or rales Abdomen: +bs, soft, non tender, non distended, no masses, no hepatomegaly, no splenomegaly Musculoskeletal: nontender, no swelling, no obvious deformity Extremities: no edema, no cyanosis, no clubbing Pulses: 2+ symmetric, upper extreamities, LE eripheral pulses thready and without edema. No aortic or femoral bruits. Neurological: alert, oriented x 3, CN2-12 intact, strength normal upper extremities and lower extremities, sensation normal throughout, DTRs 2+ throughout, no cerebellar signs, gait normal Psychiatric: normal affect, behavior normal, pleasant   Medicare Attestation I have personally reviewed: The patient's medical and social history Their use of alcohol, tobacco or illicit drugs Their current medications and supplements The patient's functional ability including ADLs,fall risks, home safety risks, cognitive, and hearing and visual impairment Diet and physical activities Evidence for depression or mood disorders  The patient's weight, height, BMI, and visual acuity have been recorded in the chart.  I have made referrals, counseling, and provided education to the patient based on review of the above and I have provided the patient with a written personalized care plan for preventive services.     Izora Ribas, NP   12/07/2018

## 2018-12-07 ENCOUNTER — Ambulatory Visit: Payer: Self-pay | Admitting: Physician Assistant

## 2018-12-07 ENCOUNTER — Encounter: Payer: Self-pay | Admitting: Adult Health

## 2018-12-07 ENCOUNTER — Other Ambulatory Visit: Payer: Self-pay

## 2018-12-07 ENCOUNTER — Ambulatory Visit (INDEPENDENT_AMBULATORY_CARE_PROVIDER_SITE_OTHER): Payer: PPO | Admitting: Adult Health

## 2018-12-07 VITALS — BP 128/64 | HR 102 | Temp 97.5°F | Ht 73.0 in | Wt 188.6 lb

## 2018-12-07 DIAGNOSIS — R0989 Other specified symptoms and signs involving the circulatory and respiratory systems: Secondary | ICD-10-CM | POA: Diagnosis not present

## 2018-12-07 DIAGNOSIS — R7309 Other abnormal glucose: Secondary | ICD-10-CM

## 2018-12-07 DIAGNOSIS — I480 Paroxysmal atrial fibrillation: Secondary | ICD-10-CM | POA: Diagnosis not present

## 2018-12-07 DIAGNOSIS — E782 Mixed hyperlipidemia: Secondary | ICD-10-CM | POA: Diagnosis not present

## 2018-12-07 DIAGNOSIS — R6889 Other general symptoms and signs: Secondary | ICD-10-CM | POA: Diagnosis not present

## 2018-12-07 DIAGNOSIS — Z0000 Encounter for general adult medical examination without abnormal findings: Secondary | ICD-10-CM

## 2018-12-07 DIAGNOSIS — I739 Peripheral vascular disease, unspecified: Secondary | ICD-10-CM | POA: Diagnosis not present

## 2018-12-07 DIAGNOSIS — K029 Dental caries, unspecified: Secondary | ICD-10-CM

## 2018-12-07 DIAGNOSIS — F988 Other specified behavioral and emotional disorders with onset usually occurring in childhood and adolescence: Secondary | ICD-10-CM | POA: Diagnosis not present

## 2018-12-07 DIAGNOSIS — F17200 Nicotine dependence, unspecified, uncomplicated: Secondary | ICD-10-CM

## 2018-12-07 DIAGNOSIS — F319 Bipolar disorder, unspecified: Secondary | ICD-10-CM

## 2018-12-07 DIAGNOSIS — F341 Dysthymic disorder: Secondary | ICD-10-CM

## 2018-12-07 DIAGNOSIS — Z0001 Encounter for general adult medical examination with abnormal findings: Secondary | ICD-10-CM

## 2018-12-07 DIAGNOSIS — E559 Vitamin D deficiency, unspecified: Secondary | ICD-10-CM | POA: Diagnosis not present

## 2018-12-07 DIAGNOSIS — Z122 Encounter for screening for malignant neoplasm of respiratory organs: Secondary | ICD-10-CM

## 2018-12-07 DIAGNOSIS — D126 Benign neoplasm of colon, unspecified: Secondary | ICD-10-CM

## 2018-12-07 DIAGNOSIS — N320 Bladder-neck obstruction: Secondary | ICD-10-CM

## 2018-12-07 DIAGNOSIS — I7300 Raynaud's syndrome without gangrene: Secondary | ICD-10-CM

## 2018-12-07 NOTE — Patient Instructions (Addendum)
[name] , Thank you for taking time to come for your Medicare Wellness Visit. I appreciate your ongoing commitment to your health goals. Please review the following plan we discussed and let me know if I can assist you in the future.   These are the goals we discussed: Goals    . Cut out extra servings    . Increase physical activity    . Increase water intake    . Plan meals    . Quit Smoking       This is a list of the screening recommended for you and due dates:  Health Maintenance  Topic Date Due  .  Hepatitis C: One time screening is recommended by Center for Disease Control  (CDC) for  adults born from [birth year] through 1964.   05/27/2019*  . Flu Shot  01/29/2019  . Colon Cancer Screening  07/24/2019  . Tetanus Vaccine  07/01/2023  . Pneumonia vaccines  Completed  *Topic was postponed. The date shown is not the original due date.    Ask work about whether could do quanteferon gold instead of chest xray   Dr. Henrene Pastor phone number [phone] need to schedule follow up colonsocpy   Dr. Katy Fitch phone number - [phone]     [place]  [phone] for more information or for a free program for smoking cessation help.   You can call QUIT SMART 1-800-QUIT-NOW for free nicotine patches or replacement therapy- if they are out- keep calling  [facility] Can call for smoking cessation classes, [phone]  If you have a smart phone, please look up Smoke Free app, this will help you stay on track and give you information about money you have saved, life that you have gained back and a ton of more information.     ADVANTAGES OF QUITTING SMOKING  Within 20 minutes, blood pressure decreases. Your pulse is at normal level.  After 8 hours, carbon monoxide levels in the blood return to normal. Your oxygen level increases.  After 24 hours, the chance of having a heart attack starts to decrease. Your breath, hair, and body stop  smelling like smoke.  After 48 hours, damaged nerve endings begin to recover. Your sense of taste and smell improve.  After 72 hours, the body is virtually free of nicotine. Your bronchial tubes relax and breathing becomes easier.  After 2 to 12 weeks, lungs can hold more air. Exercise becomes easier and circulation improves.  After 1 year, the risk of coronary heart disease is cut in half.  After 5 years, the risk of stroke falls to the same as a nonsmoker.  After 10 years, the risk of lung cancer is cut in half and the risk of other cancers decreases significantly.  After 15 years, the risk of coronary heart disease drops, usually to the level of a nonsmoker.  You will have extra money to spend on things other than cigarettes.       Lung Cancer Screening A lung cancer screening is a test that checks for lung cancer. Lung cancer screening is done to look for lung cancer in its very early stages, before it spreads and becomes harder to treat and before symptoms appear. Finding cancer early improves the chances of successful treatment. It may save your life. Should I be screened for lung cancer? You should be screened for lung cancer if all of these apply:  You currently smoke or you have quit smoking within  the past 15 years.  You are 13-54 years old. Screening may be recommended up to age 70 depending on your overall health and other factors.  You are in good general health.  You have a smoking history of 1 pack a day for 30 years or 2 packs a day for 15 years. Screening may also be recommended if you are at high risk for the disease. You may be at high risk if:  You have a family history of lung cancer.  You have been exposed to asbestos.  You have chronic obstructive pulmonary disease (COPD).  You have a history of previous lung cancer. How often should I be screened for lung cancer?  If you are at risk for lung cancer, it is recommended that you are screened once a  year. The recommended screening test is a low-dose CT scan. How can I lower my risk of lung cancer? To lower your risk of developing lung cancer:  If you smoke, stop smoking all tobacco products.  Avoid secondhand smoke.  Avoid exposure to radiation.  Avoid exposure to radon gas. Have your home checked for radon regularly.  Avoid things that cause cancer (carcinogens).  Avoid living or working in places with high air pollution. Where to find more information Ask your health care provider about the risks and benefits of screening. More information and resources are available from these organizations:  [place] (ACS): www.cancer.org  American Lung Association: www.lung.org Contact a health care provider if:  You start to show symptoms of lung cancer, including: ? Coughing that will not go away. ? Wheezing. ? Chest pain. ? Coughing up blood. ? Shortness of breath. ? Weight loss that cannot be explained. ? Constant fatigue. Summary  Lung cancer screening may find lung cancer before symptoms appear. Finding cancer early improves the chances of successful treatment. It may save your life.  If you are at risk for lung cancer, it is recommended that you are screened once a year. The recommended screening test is a low-dose CT scan.  You can make lifestyle changes to lower your risk of lung cancer.  Ask your health care provider about the risks and benefits of screening. This information is not intended to replace advice given to you by your health care provider. Make sure you discuss any questions you have with your health care provider. Document Released: 05/07/2016 Document Revised: 09/08/2017 Document Reviewed: 05/07/2016 Elsevier Interactive Patient Education  [employer].

## 2018-12-08 LAB — CBC WITH DIFFERENTIAL/PLATELET
Absolute Monocytes: 593 cells/uL (ref 200–950)
Basophils Absolute: 78 cells/uL (ref 0–200)
Basophils Relative: 1 %
Eosinophils Absolute: 343 cells/uL (ref 15–500)
Eosinophils Relative: 4.4 %
HCT: 41.8 % (ref 38.5–50.0)
Hemoglobin: 13.8 g/dL (ref 13.2–17.1)
Lymphs Abs: 1732 cells/uL (ref 850–3900)
MCH: 30.1 pg (ref 27.0–33.0)
MCHC: 33 g/dL (ref 32.0–36.0)
MCV: 91.3 fL (ref 80.0–100.0)
MPV: 11.5 fL (ref 7.5–12.5)
Monocytes Relative: 7.6 %
Neutro Abs: 5054 cells/uL (ref 1500–7800)
Neutrophils Relative %: 64.8 %
Platelets: 178 10*3/uL (ref 140–400)
RBC: 4.58 10*6/uL (ref 4.20–5.80)
RDW: 13.1 % (ref 11.0–15.0)
Total Lymphocyte: 22.2 %
WBC: 7.8 10*3/uL (ref 3.8–10.8)

## 2018-12-08 LAB — COMPLETE METABOLIC PANEL WITH GFR
AG Ratio: 1.7 (calc) (ref 1.0–2.5)
ALT: 21 U/L (ref 9–46)
AST: 21 U/L (ref 10–35)
Albumin: 4.3 g/dL (ref 3.6–5.1)
Alkaline phosphatase (APISO): 67 U/L (ref 35–144)
BUN: 15 mg/dL (ref 7–25)
CO2: 26 mmol/L (ref 20–32)
Calcium: 9.5 mg/dL (ref 8.6–10.3)
Chloride: 107 mmol/L (ref 98–110)
Creat: 0.91 mg/dL (ref 0.70–1.25)
GFR, Est African American: 101 mL/min/{1.73_m2} (ref >=60)
GFR, Est Non African American: 87 mL/min/{1.73_m2} (ref >=60)
Globulin: 2.5 g/dL (calc) (ref 1.9–3.7)
Glucose, Bld: 107 mg/dL — ABNORMAL HIGH (ref 65–99)
Potassium: 4.5 mmol/L (ref 3.5–5.3)
Sodium: 141 mmol/L (ref 135–146)
Total Bilirubin: 0.5 mg/dL (ref 0.2–1.2)
Total Protein: 6.8 g/dL (ref 6.1–8.1)

## 2018-12-08 LAB — LIPID PANEL
Cholesterol: 121 mg/dL (ref <200)
HDL: 46 mg/dL (ref >=40)
LDL Cholesterol (Calc): 56 mg/dL (calc)
Non-HDL Cholesterol (Calc): 75 mg/dL (calc) (ref <130)
Total CHOL/HDL Ratio: 2.6 (calc) (ref <5.0)
Triglycerides: 105 mg/dL (ref <150)

## 2018-12-08 LAB — TSH: TSH: 1.01 mIU/L (ref 0.40–4.50)

## 2018-12-08 LAB — VITAMIN D 25 HYDROXY (VIT D DEFICIENCY, FRACTURES): Vit D, 25-Hydroxy: 65 ng/mL (ref 30–100)

## 2018-12-08 LAB — HEMOGLOBIN A1C
Hgb A1c MFr Bld: 5.8 % of total Hgb — ABNORMAL HIGH (ref <5.7)
Mean Plasma Glucose: 120 (calc)
eAG (mmol/L): 6.6 (calc)

## 2018-12-10 ENCOUNTER — Encounter: Payer: Self-pay | Admitting: Cardiovascular Disease

## 2018-12-10 ENCOUNTER — Ambulatory Visit (INDEPENDENT_AMBULATORY_CARE_PROVIDER_SITE_OTHER): Payer: PPO | Admitting: Cardiovascular Disease

## 2018-12-10 ENCOUNTER — Other Ambulatory Visit: Payer: Self-pay

## 2018-12-10 VITALS — BP 116/60 | HR 79 | Temp 98.4°F | Ht 73.5 in | Wt 187.0 lb

## 2018-12-10 DIAGNOSIS — F17200 Nicotine dependence, unspecified, uncomplicated: Secondary | ICD-10-CM

## 2018-12-10 DIAGNOSIS — E782 Mixed hyperlipidemia: Secondary | ICD-10-CM | POA: Diagnosis not present

## 2018-12-10 DIAGNOSIS — R0989 Other specified symptoms and signs involving the circulatory and respiratory systems: Secondary | ICD-10-CM

## 2018-12-10 DIAGNOSIS — I480 Paroxysmal atrial fibrillation: Secondary | ICD-10-CM

## 2018-12-10 DIAGNOSIS — I739 Peripheral vascular disease, unspecified: Secondary | ICD-10-CM

## 2018-12-10 NOTE — Assessment & Plan Note (Signed)
History of hypertension with blood pressure measured at 116/60.  He is on no antihypertensive medications.

## 2018-12-10 NOTE — Assessment & Plan Note (Signed)
History of continued tobacco abuse of three quarters of a pack a day recalcitrant to respect modification.

## 2018-12-10 NOTE — Progress Notes (Signed)
12/10/2018 Bradley Ferguson   [birth year]  [id number]  Primary Physician Bradley Pinto, MD Primary Cardiologist: Bradley Harp MD Bradley Ferguson, [place]  HPI:  Bradley Ferguson is a 68 y.o.  separated, father of 14, grandfather one grandchild who works as a Systems developer. [facility] because of life limiting claudication.I last saw him in the office  12/01/2017.His risk factors include treated hyperlipidemia and 45 pack years of tobacco abuse continued to smoke one pack per day. Never had a heart attack or stroke. He does complain of some dyspnea on exertion probably related to COPD. He's had A. fib ablation back in 2011 by Bradley Ferguson but has not followed up since. He has complained of less limiting claudication over the last year which is symmetric and bilateral. He can walk one block after which she has to stop because of discomfort. He has had lower extremity arterial Doppler studies in our office 04/11/17 revealing a high-frequency signal in his right common iliac artery and a total left SFA. I performed peripheral angiography on him 04/27/17 revealing a 99% calcified eccentric proximal right common iliac artery stenosis and bilateral long segment SFA chronic total occlusions which were highly calcified. I performed direct back orbital rotational atherectomy, PTCA and cover stenting using a 7 mm x 39 mm long VBX covered stent. Excellent angiographic result. The claudication has somewhat improved as has his Doppler studies. I do not think his SFAs are easily percutaneously addressable. Since I saw him a year ago he does complain of some continued claudication.  He continues to smoke as well.  He also complains of some shortness of breath.  He had Doppler studies performed 12/11/2017 revealing a patent right iliac stent.   Current Meds  Medication Sig  . ALPRAZolam (XANAX) 1 MG tablet Take 1/2-1 tablet 2 - 3 x /day ONLY if needed for Anxiety  Attack &  limit to 5 days /week to avoid addiction  . atorvastatin (LIPITOR) 80 MG tablet Take 1 tablet daily for Cholesterol  . CHOLECALCIFEROL PO Take 10,000 Units by mouth daily.  . clopidogrel (PLAVIX) 75 MG tablet Take 1 tablet daily for Circulation  . dofetilide (TIKOSYN) 500 MCG capsule Take 1 capsule (500 mcg total) by mouth 2 (two) times daily.  Marland Kitchen ketoconazole (NIZORAL) 2 % cream Apply to rash 1 to 2 x /day  . rivaroxaban (XARELTO) 20 MG TABS tablet Take 1 tablet daily for Atrial Fibrillation     Allergies  Allergen Reactions  . Effexor [Venlafaxine]     Feels bad  . Wellbutrin [Bupropion]     sleepy  . Zoloft [Sertraline Hcl]     Felt bad    Social History   Socioeconomic History  . Marital status: Legally Separated    Spouse name: Not on file  . Number of children: 1  . Years of education: Not on file  . Highest education level: Not on file  Occupational History  . 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 Financial resource strain: Not on file  . Food insecurity    Worry: Not on file    Inability: Not on file  . Transportation needs    Medical: Not on file    Non-medical: Not on file  Tobacco Use  . Smoking status: Current Every Day Smoker    Packs/day: 1.00    Years: 49.00    Pack years: 49.00    Types: Cigarettes  Start date: 80  . Smokeless tobacco: Never Used  . Tobacco comment: Currently down to 3/4 pack   Substance and Sexual Activity  . Alcohol use: Yes    Alcohol/week: 4.0 standard drinks    Types: 4 Standard drinks or equivalent per week    Comment: 3-4 drinks per week  . Drug use: Never  . Sexual activity: Not on file  Lifestyle  . Physical activity    Days per week: Not on file    Minutes per session: Not on file  . Stress: Not on file  Relationships  . Social Herbalist on phone: Not on file    Gets together: Not on file    Attends religious service: Not on file    Active member of club or organization:  Not on file    Attends meetings of clubs or organizations: Not on file    Relationship status: Not on file  . Intimate partner violence    Fear of current or ex partner: Not on file    Emotionally abused: Not on file    Physically abused: Not on file    Forced sexual activity: Not on file  Other Topics Concern  . Not on file  Social History Narrative  . Not on file     Review of Systems: General: negative for chills, fever, night sweats or weight changes.  Cardiovascular: negative for chest pain, dyspnea on exertion, edema, orthopnea, palpitations, paroxysmal nocturnal dyspnea or shortness of breath Dermatological: negative for rash Respiratory: negative for cough or wheezing Urologic: negative for hematuria Abdominal: negative for nausea, vomiting, diarrhea, bright red blood per rectum, melena, or hematemesis Neurologic: negative for visual changes, syncope, or dizziness All other systems reviewed and are otherwise negative except as noted above.    Blood pressure 116/60, pulse 79, temperature 98.4 F (36.9 C), height 6' 1.5" (1.867 m), weight 187 lb (84.8 kg).  General appearance: alert and no distress Neck: no adenopathy, no carotid bruit, no JVD, supple, symmetrical, trachea midline and thyroid not enlarged, symmetric, no tenderness/mass/nodules Lungs: clear to auscultation bilaterally Heart: regular rate and rhythm, S1, S2 normal, no murmur, click, rub or gallop Extremities: extremities normal, atraumatic, no cyanosis or edema Pulses: Diminished pedal pulses bilaterally Skin: Skin color, texture, turgor normal. No rashes or lesions Neurologic: Alert and oriented X 3, normal strength and tone. Normal symmetric reflexes. Normal coordination and gait  EKG normal sinus rhythm at 79 with right bundle branch block.  I personally reviewed this EKG.  ASSESSMENT AND PLAN:   TOBACCO ABUSE History of continued tobacco abuse of three quarters of a pack a day recalcitrant to respect  modification.  Atrial fibrillation ([place]) History of PAF maintaining sinus rhythm on Xarelto oral anticoagulation and Tikosyn.  Hyperlipidemia, mixed History of hyperlipidemia on atorvastatin with lipid profile performed 12/07/2018 revealing total cholesterol 121, LDL 56 and HDL 46  Labile hypertension History of hypertension with blood pressure measured at 116/60.  He is on no antihypertensive medications.      Bradley Harp MD FACP,FACC,FAHA, [facility] 12/10/2018 2:55 PM

## 2018-12-10 NOTE — Assessment & Plan Note (Addendum)
History of PAF maintaining sinus rhythm on Xarelto oral anticoagulation and Tikosyn.

## 2018-12-10 NOTE — Patient Instructions (Signed)
Medication Instructions:  Your physician recommends that you continue on your current medications as directed. Please refer to the Current Medication list given to you today.  If you need a refill on your cardiac medications before your next appointment, please call your pharmacy.   Lab work: NONE If you have labs (blood work) drawn today and your tests are completely normal, you will receive your results only by: Marland Kitchen MyChart Message (if you have MyChart) OR . A paper copy in the mail If you have any lab test that is abnormal or we need to change your treatment, we will call you to review the results.  Testing/Procedures: Your physician has requested that you have an aorta/iliac duplex. During this test, an ultrasound is used to evaluate blood flow to the aorta and iliac arteries. Allow one hour for this exam. Do not eat after midnight the day before and avoid carbonated beverages. SCHEDULED FOR 12/21/2018 AT 9:00AM  Your physician has requested that you have an ankle brachial index (ABI). During this test an ultrasound and blood pressure cuff are used to evaluate the arteries that supply the arms and legs with blood. Allow thirty minutes for this exam. There are no restrictions or special instructions. SCHEDULED FOR 12/21/2018 AT 10:00AM   Follow-Up: At [facility], you and your health needs are our priority.  As part of our continuing mission to provide you with exceptional heart care, we have created designated Provider Care Teams.  These Care Teams include your primary Cardiologist (physician) and Advanced Practice Providers (APPs -  Physician Assistants and Nurse Practitioners) who all work together to provide you with the care you need, when you need it. You will need a follow up appointment in 12 months WITH DR. [name].  Please call our office 2 months in advance to schedule this appointment.

## 2018-12-10 NOTE — Assessment & Plan Note (Signed)
History of hyperlipidemia on atorvastatin with lipid profile performed 12/07/2018 revealing total cholesterol 121, LDL 56 and HDL 46

## 2018-12-21 ENCOUNTER — Ambulatory Visit (HOSPITAL_BASED_OUTPATIENT_CLINIC_OR_DEPARTMENT_OTHER)
Admission: RE | Admit: 2018-12-21 | Discharge: 2018-12-21 | Disposition: A | Discharge status: Home / Self Care | Payer: PPO | Source: Ambulatory Visit | Attending: Cardiovascular Disease | Admitting: Cardiovascular Disease

## 2018-12-21 ENCOUNTER — Ambulatory Visit (HOSPITAL_COMMUNITY)
Admission: RE | Admit: 2018-12-21 | Discharge: 2018-12-21 | Disposition: A | Discharge status: Home / Self Care | Payer: PPO | Source: Ambulatory Visit | Attending: Cardiology | Admitting: Cardiology

## 2018-12-21 ENCOUNTER — Other Ambulatory Visit: Payer: Self-pay

## 2018-12-21 ENCOUNTER — Other Ambulatory Visit: Payer: Self-pay | Admitting: Cardiovascular Disease

## 2018-12-21 DIAGNOSIS — I739 Peripheral vascular disease, unspecified: Secondary | ICD-10-CM

## 2018-12-21 DIAGNOSIS — Z95828 Presence of other vascular implants and grafts: Secondary | ICD-10-CM

## 2018-12-27 ENCOUNTER — Encounter: Payer: Self-pay | Admitting: Internal Medicine

## 2018-12-28 ENCOUNTER — Other Ambulatory Visit: Payer: Self-pay

## 2018-12-28 DIAGNOSIS — I739 Peripheral vascular disease, unspecified: Secondary | ICD-10-CM

## 2018-12-28 NOTE — Progress Notes (Signed)
Notes recorded by Lorretta Harp, MD on 12/21/2018 at 10:18 AM EDT  No change from prior study. Repeat in 12 months.  S/P right common iliac artery PTA and stenting in 04/27/17. Known bilateral SFA occlusions.

## 2018-12-28 NOTE — Progress Notes (Signed)
Notes recorded by Lorretta Harp, MD on 12/21/2018 at 12:01 PM EDT  Right iliac stent widely patent. Repeat 12 months  S/P right common iliac artery PTA and stenting in 04/27/17. Known bilateral SFA occlusions.

## 2019-01-06 ENCOUNTER — Ambulatory Visit
Admission: RE | Admit: 2019-01-06 | Discharge: 2019-01-06 | Disposition: A | Discharge status: Home / Self Care | Payer: PPO | Source: Ambulatory Visit | Attending: Adult Health | Admitting: Adult Health

## 2019-01-06 DIAGNOSIS — Z122 Encounter for screening for malignant neoplasm of respiratory organs: Secondary | ICD-10-CM

## 2019-01-06 DIAGNOSIS — F17200 Nicotine dependence, unspecified, uncomplicated: Secondary | ICD-10-CM

## 2019-01-06 DIAGNOSIS — F17210 Nicotine dependence, cigarettes, uncomplicated: Secondary | ICD-10-CM | POA: Diagnosis not present

## 2019-01-10 ENCOUNTER — Other Ambulatory Visit: Payer: Self-pay | Admitting: Adult Health

## 2019-01-10 ENCOUNTER — Encounter: Payer: Self-pay | Admitting: Adult Health

## 2019-01-10 DIAGNOSIS — R918 Other nonspecific abnormal finding of lung field: Secondary | ICD-10-CM

## 2019-01-10 DIAGNOSIS — F17200 Nicotine dependence, unspecified, uncomplicated: Secondary | ICD-10-CM

## 2019-01-24 ENCOUNTER — Other Ambulatory Visit: Payer: Self-pay

## 2019-01-24 DIAGNOSIS — E782 Mixed hyperlipidemia: Secondary | ICD-10-CM

## 2019-01-24 MED ORDER — ATORVASTATIN CALCIUM 80 MG PO TABS: ORAL_TABLET | ORAL | 1 refills | Status: DC

## 2019-01-24 NOTE — Progress Notes (Signed)
Refill request for Atorvastatin 80mg  tablet

## 2019-03-27 ENCOUNTER — Encounter: Payer: Self-pay | Admitting: Internal Medicine

## 2019-03-27 NOTE — Patient Instructions (Signed)

## 2019-03-27 NOTE — Progress Notes (Signed)
Annual  Screening/Preventative Visit  & Comprehensive Evaluation & Examination     This very nice 68 y.o.  [name] presents for a Screening /Preventative Visit & comprehensive evaluation and management of multiple medical co-morbidities.  Patient has been followed for HTN,ASHD, ASPVD, HLD, Prediabetes and Vitamin D Deficiency. Patient also has prior dx/o BipolarDisorder & Depression - currently in remission.     Due to hislong history of smoking over 50 pk years,  I discussed lung cancer screening with him. He was agreeable to undergo a screening low dose CT scan of the chest.  We discussed smoking cessation techniques/options. I will refer him for a LDCT lung scan & lung cancer screening program     HTN predates since 1996. Patient's BP has been controlled at home.  Today's BP is at goal - 108/60.  Patient  underwent an Ablation for pAfib remotely in 2011 by Dr Caryl Comes & was lost to f/u. In Nov 2019, he was hospitalized again with Afib (CHADsVASc 4) and started on Tykosyn & Xarelto.  In Oct 2018, he had PCA, Arthrotomy of a Rt Common Iliac stenosis with Stent implanted by Dr [name].  Patient denies any cardiac symptoms as chest pain, palpitations, shortness of breath, dizziness or ankle swelling.     Patient's hyperlipidemia is controlled with diet and Atorvastatin. Patient denies myalgias or other medication SE's. Last lipids were at goal:  Lab Results  Component Value Date   CHOL 121 12/07/2018   HDL 46 12/07/2018   LDLCALC 56 12/07/2018   TRIG 105 12/07/2018   CHOLHDL 2.6 12/07/2018      Patient has hx/o prediabetes since    and patient denies reactive hypoglycemic symptoms, visual blurring, diabetic polys or paresthesias. Last A1c was not at goal:  Lab Results  Component Value Date   HGBA1C 5.8 (H) 12/07/2018       Finally, patient has history of Vitamin D Deficiency ("28" / 2017)and last vitamin D was at goal:  Lab Results  Component Value Date   VD25OH 5 12/07/2018   Current  Outpatient Medications on File Prior to Visit  Medication Sig  . ALPRAZolam (XANAX) 1 MG tablet Take 1/2-1 tablet 2 - 3 x /day ONLY if needed for Anxiety Attack &  limit to 5 days /week to avoid addiction  . aspirin EC 81 MG tablet Take 81 mg by mouth daily.  Marland Kitchen atorvastatin (LIPITOR) 80 MG tablet Take 1 tablet daily for Cholesterol  . CHOLECALCIFEROL PO Take 10,000 Units by mouth daily.  Marland Kitchen dofetilide (TIKOSYN) 500 MCG capsule Take 1 capsule (500 mcg total) by mouth 2 (two) times daily.  Marland Kitchen ketoconazole (NIZORAL) 2 % cream Apply to rash 1 to 2 x /day  . rivaroxaban (XARELTO) 20 MG TABS tablet Take 1 tablet daily for Atrial Fibrillation   No current facility-administered medications on file prior to visit.    Allergies  Allergen Reactions  . Effexor [Venlafaxine]     Feels bad  . Wellbutrin [Bupropion]     sleepy  . Zoloft [Sertraline Hcl]     Felt bad   Past Medical History:  Diagnosis Date  . Anxiety   . Atrial fibrillation ([place])   . Blood transfusion without reported diagnosis   . Cataract   . Depression   . Emphysema of lung ([place])   . Heart murmur   . Hyperlipidemia   . PVD (peripheral vascular disease) ([place])   . Raynaud's syndrome    Health Maintenance  Topic Date Due  .  INFLUENZA VACCINE  01/29/2019  . Hepatitis C Screening  05/27/2019 (Originally May 06, 1951)  . COLONOSCOPY  07/24/2019  . TETANUS/TDAP  07/01/2023  . PNA vac Low Risk Adult  Completed   Immunization History  Administered Date(s) Administered  . Influenza Whole 04/30/2010  . Influenza, High Dose Seasonal PF 03/20/2017, 03/29/2018  . Pneumococcal Conjugate-13 02/05/2017  . Pneumococcal Polysaccharide-23 04/30/2010, 03/29/2018  . Td 06/30/2013   Last Colonoscopy - 07/23/2018 - Dr Henrene Pastor - #20 polyps thru-out the colon - Recommended f/u Colon in 6 months. (overdue)  Past Surgical History:  Procedure Laterality Date  . ATRIAL FIBRILLATION ABLATION    . CATARACT EXTRACTION Right   . FRACTURE SURGERY     . LOWER EXTREMITY INTERVENTION N/A 04/27/2017   Procedure: LOWER EXTREMITY INTERVENTION;  Surgeon: Lorretta Harp, MD;  Location: [facility];  Service: Cardiovascular;  Laterality: N/A;  . PERIPHERAL VASCULAR ATHERECTOMY Right 04/27/2017   Procedure: PERIPHERAL VASCULAR ATHERECTOMY;  Surgeon: Lorretta Harp, MD;  Location: [facility];  Service: Cardiovascular;  Laterality: Right;  Iliac  . PERIPHERAL VASCULAR INTERVENTION Right 04/27/2017   Procedure: PERIPHERAL VASCULAR INTERVENTION;  Surgeon: Lorretta Harp, MD;  Location: [facility];  Service: Cardiovascular;  Laterality: Right;  Iliac   Family History  Problem Relation Age of Onset  . Cancer Mother        small cell lung cancer  . Cancer Father        unsure type  . Colon cancer Neg Hx   . Rectal cancer Neg Hx    Social History   Socioeconomic History  . Marital status: Legally Separated    Spouse name: Not on file  . Number of children: 1  Occupational History  . 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 Smoking status: Current Every Day Smoker    Packs/day: 1.00    Years: 49.00    Pack years: 49.00    Types: Cigarettes    Start date: 34  . Smokeless tobacco: Never Used  . Tobacco comment: Currently down to 3/4 pack   Substance and Sexual Activity  . Alcohol use: Yes    Alcohol/week: 4.0 standard drinks    Types: 4 Standard drinks or equivalent per week    Comment: 3-4 drinks per week  . Drug use: Never  . Sexual activity: Not on file    ROS Constitutional: Denies fever, chills, weight loss/gain, headaches, insomnia,  night sweats or change in appetite. Does c/o fatigue. Eyes: Denies redness, blurred vision, diplopia, discharge, itchy or watery eyes.  ENT: Denies discharge, congestion, post nasal drip, epistaxis, sore throat, earache, hearing loss, dental pain, Tinnitus, Vertigo, Sinus pain or snoring.  Cardio: Denies chest pain, palpitations, irregular heartbeat,  syncope, dyspnea, diaphoresis, orthopnea, PND, claudication or edema Respiratory: denies cough, dyspnea, DOE, pleurisy, hoarseness, laryngitis or wheezing.  Gastrointestinal: Denies dysphagia, heartburn, reflux, water brash, pain, cramps, nausea, vomiting, bloating, diarrhea, constipation, hematemesis, melena, hematochezia, jaundice or hemorrhoids Genitourinary: Denies dysuria, frequency, discharge, hematuria or flank pain. Has urgency, nocturia x 2-3 & occasional hesitancy. Musculoskeletal: Denies arthralgia, myalgia, stiffness, Jt. Swelling, pain, limp or strain/sprain. Denies Falls. Skin: Denies puritis, rash, hives, warts, acne, eczema or change in skin lesion Neuro: No weakness, tremor, incoordination, spasms, paresthesia or pain Psychiatric: Denies confusion, memory loss or sensory loss. Denies Depression. Endocrine: Denies change in weight, skin, hair change, nocturia, and paresthesia, diabetic polys, visual blurring or hyper / hypo glycemic episodes.  Heme/Lymph: No excessive bleeding, bruising  or enlarged lymph nodes.  Physical Exam  BP 108/60   Pulse 60   Temp (!) 97.5 F (36.4 C)   Resp 16   Ht 6\' 1"  (1.854 m)   Wt 189 lb (85.7 kg)   BMI 24.94 kg/m   General Appearance: Well nourished and well groomed and in no apparent distress.  Eyes: PERRLA, EOMs, conjunctiva no swelling or erythema, normal fundi and vessels. Sinuses: No frontal/maxillary tenderness ENT/Mouth: EACs patent / TMs  nl. Nares clear without erythema, swelling, mucoid exudates. Oral hygiene is good. No erythema, swelling, or exudate. Tongue normal, non-obstructing. Tonsils not swollen or erythematous. Hearing normal.  Neck: Supple, thyroid not palpable. No bruits, nodes or JVD. Respiratory: Respiratory effort normal.  BS equal and clear bilateral without rales, rhonci, wheezing or stridor. Cardio: Heart sounds are normal with regular rate and rhythm and no murmurs, rubs or gallops. Peripheral pulses are normal  and equal bilaterally without edema. No aortic or femoral bruits. Chest: symmetric with normal excursions and percussion.  Abdomen: Soft, with Nl bowel sounds. Nontender, no guarding, rebound, hernias, masses, or organomegaly.  Lymphatics: Non tender without lymphadenopathy.  Musculoskeletal: Full ROM all peripheral extremities, joint stability, 5/5 strength, and normal gait. Skin: Warm and dry without rashes, lesions, cyanosis, clubbing or  ecchymosis.  Neuro: Cranial nerves intact, reflexes equal bilaterally. Normal muscle tone, no cerebellar symptoms. Sensation intact.  Pysch: Alert and oriented X 3 with normal affect, insight and judgment appropriate.   Assessment and Plan  1. Annual Preventative/Screening Exam   2. Labile hypertension  - EKG 12-Lead - [place], RETROPERITNL ABD,  LTD - Urinalysis, Routine w reflex microscopic - Microalbumin / creatinine urine ratio - CBC with Differential/Platelet - COMPLETE METABOLIC PANEL WITH GFR - Magnesium - TSH  3. Hyperlipidemia, mixed  - EKG 12-Lead - [place], RETROPERITNL ABD,  LTD - Lipid panel - TSH  4. Abnormal glucose  - EKG 12-Lead - [place], RETROPERITNL ABD,  LTD - Hemoglobin A1c - Insulin, random  5. Vitamin D deficiency  - VITAMIN D 25 Hydroxyl  6. Prediabetes  - EKG 12-Lead - [place], RETROPERITNL ABD,  LTD - Hemoglobin A1c - Insulin, random  7. Paroxysmal atrial fibrillation ([facility])  - TSH  8. PVD (peripheral vascular disease) ([facility])  - LOW EXTREMITY NEUR EXAM DOCUM  9. Bipolar I disorder ([place])   10. BPH with obstruction/lower urinary tract symptoms  - PSA  11. History of bipolar disorder  12. Prostate cancer screening  - PSA  13. Screening for ischemic heart disease  - EKG 12-Lead  14. Smoker  - EKG 12-Lead - [place], RETROPERITNL ABD,  LTD  15. Screening for AAA (aortic abdominal aneurysm)  - [place], RETROPERITNL ABD,  LTD  16. Medication management  - Urinalysis, Routine w reflex microscopic -  Microalbumin / creatinine urine ratio - CBC with Differential/Platelet - COMPLETE METABOLIC PANEL WITH GFR - Magnesium - Lipid panel - TSH - Hemoglobin A1c - Insulin, random - VITAMIN D 25 Hydroxyl         Patient was counseled in prudent diet, weight control to achieve/maintain BMI less than 25, BP monitoring, regular exercise and medications as discussed.  Discussed med effects and SE's. Routine screening labs and tests as requested with regular follow-up as recommended. Over 40 minutes of exam, counseling, chart review and high complex critical decision making was performed   Kirtland Bouchard, MD

## 2019-03-28 ENCOUNTER — Ambulatory Visit (INDEPENDENT_AMBULATORY_CARE_PROVIDER_SITE_OTHER): Payer: PPO | Admitting: Internal Medicine

## 2019-03-28 ENCOUNTER — Other Ambulatory Visit: Payer: Self-pay

## 2019-03-28 VITALS — BP 108/60 | HR 60 | Temp 97.5°F | Resp 16 | Ht 73.0 in | Wt 189.0 lb

## 2019-03-28 DIAGNOSIS — R0989 Other specified symptoms and signs involving the circulatory and respiratory systems: Secondary | ICD-10-CM | POA: Diagnosis not present

## 2019-03-28 DIAGNOSIS — F17200 Nicotine dependence, unspecified, uncomplicated: Secondary | ICD-10-CM

## 2019-03-28 DIAGNOSIS — I480 Paroxysmal atrial fibrillation: Secondary | ICD-10-CM

## 2019-03-28 DIAGNOSIS — R7309 Other abnormal glucose: Secondary | ICD-10-CM

## 2019-03-28 DIAGNOSIS — Z79899 Other long term (current) drug therapy: Secondary | ICD-10-CM

## 2019-03-28 DIAGNOSIS — Z0000 Encounter for general adult medical examination without abnormal findings: Secondary | ICD-10-CM

## 2019-03-28 DIAGNOSIS — E782 Mixed hyperlipidemia: Secondary | ICD-10-CM

## 2019-03-28 DIAGNOSIS — F319 Bipolar disorder, unspecified: Secondary | ICD-10-CM

## 2019-03-28 DIAGNOSIS — Z136 Encounter for screening for cardiovascular disorders: Secondary | ICD-10-CM

## 2019-03-28 DIAGNOSIS — I739 Peripheral vascular disease, unspecified: Secondary | ICD-10-CM

## 2019-03-28 DIAGNOSIS — Z122 Encounter for screening for malignant neoplasm of respiratory organs: Secondary | ICD-10-CM

## 2019-03-28 DIAGNOSIS — R7303 Prediabetes: Secondary | ICD-10-CM | POA: Diagnosis not present

## 2019-03-28 DIAGNOSIS — Z8659 Personal history of other mental and behavioral disorders: Secondary | ICD-10-CM

## 2019-03-28 DIAGNOSIS — Z125 Encounter for screening for malignant neoplasm of prostate: Secondary | ICD-10-CM

## 2019-03-28 DIAGNOSIS — E559 Vitamin D deficiency, unspecified: Secondary | ICD-10-CM

## 2019-03-28 DIAGNOSIS — N138 Other obstructive and reflux uropathy: Secondary | ICD-10-CM

## 2019-03-29 LAB — URINALYSIS, ROUTINE W REFLEX MICROSCOPIC
Bilirubin Urine: NEGATIVE
Glucose, UA: NEGATIVE
Hgb urine dipstick: NEGATIVE
Ketones, ur: NEGATIVE
Leukocytes,Ua: NEGATIVE
Nitrite: NEGATIVE
Protein, ur: NEGATIVE
Specific Gravity, Urine: 1.013 (ref 1.001–1.03)
pH: 6.5 (ref 5.0–8.0)

## 2019-03-29 LAB — COMPLETE METABOLIC PANEL WITH GFR
AG Ratio: 1.8 (calc) (ref 1.0–2.5)
ALT: 32 U/L (ref 9–46)
AST: 30 U/L (ref 10–35)
Albumin: 4.2 g/dL (ref 3.6–5.1)
Alkaline phosphatase (APISO): 64 U/L (ref 35–144)
BUN: 15 mg/dL (ref 7–25)
CO2: 26 mmol/L (ref 20–32)
Calcium: 9.3 mg/dL (ref 8.6–10.3)
Chloride: 105 mmol/L (ref 98–110)
Creat: 1.02 mg/dL (ref 0.70–1.25)
GFR, Est African American: 88 mL/min/{1.73_m2} (ref >=60)
GFR, Est Non African American: 76 mL/min/{1.73_m2} (ref >=60)
Globulin: 2.4 g/dL (calc) (ref 1.9–3.7)
Glucose, Bld: 105 mg/dL — ABNORMAL HIGH (ref 65–99)
Potassium: 4.3 mmol/L (ref 3.5–5.3)
Sodium: 140 mmol/L (ref 135–146)
Total Bilirubin: 0.8 mg/dL (ref 0.2–1.2)
Total Protein: 6.6 g/dL (ref 6.1–8.1)

## 2019-03-29 LAB — CBC WITH DIFFERENTIAL/PLATELET
Absolute Monocytes: 638 cells/uL (ref 200–950)
Basophils Absolute: 90 cells/uL (ref 0–200)
Basophils Relative: 1.2 %
Eosinophils Absolute: 390 cells/uL (ref 15–500)
Eosinophils Relative: 5.2 %
HCT: 41.1 % (ref 38.5–50.0)
Hemoglobin: 13.7 g/dL (ref 13.2–17.1)
Lymphs Abs: 1718 cells/uL (ref 850–3900)
MCH: 30.3 pg (ref 27.0–33.0)
MCHC: 33.3 g/dL (ref 32.0–36.0)
MCV: 90.9 fL (ref 80.0–100.0)
MPV: 12 fL (ref 7.5–12.5)
Monocytes Relative: 8.5 %
Neutro Abs: 4665 cells/uL (ref 1500–7800)
Neutrophils Relative %: 62.2 %
Platelets: 178 10*3/uL (ref 140–400)
RBC: 4.52 10*6/uL (ref 4.20–5.80)
RDW: 13.5 % (ref 11.0–15.0)
Total Lymphocyte: 22.9 %
WBC: 7.5 10*3/uL (ref 3.8–10.8)

## 2019-03-29 LAB — HEMOGLOBIN A1C
Hgb A1c MFr Bld: 5.7 % of total Hgb — ABNORMAL HIGH (ref <5.7)
Mean Plasma Glucose: 117 (calc)
eAG (mmol/L): 6.5 (calc)

## 2019-03-29 LAB — LIPID PANEL
Cholesterol: 126 mg/dL (ref <200)
HDL: 36 mg/dL — ABNORMAL LOW (ref >=40)
LDL Cholesterol (Calc): 67 mg/dL (calc)
Non-HDL Cholesterol (Calc): 90 mg/dL (calc) (ref <130)
Total CHOL/HDL Ratio: 3.5 (calc) (ref <5.0)
Triglycerides: 157 mg/dL — ABNORMAL HIGH (ref <150)

## 2019-03-29 LAB — PSA: PSA: 0.6 ng/mL (ref <=4.0)

## 2019-03-29 LAB — TSH: TSH: 1.92 mIU/L (ref 0.40–4.50)

## 2019-03-29 LAB — VITAMIN D 25 HYDROXY (VIT D DEFICIENCY, FRACTURES): Vit D, 25-Hydroxy: 55 ng/mL (ref 30–100)

## 2019-03-29 LAB — MICROALBUMIN / CREATININE URINE RATIO
Creatinine, Urine: 145 mg/dL (ref 20–320)
Microalb Creat Ratio: 11 mcg/mg creat (ref <30)
Microalb, Ur: 1.6 mg/dL

## 2019-03-29 LAB — MAGNESIUM: Magnesium: 2 mg/dL (ref 1.5–2.5)

## 2019-03-29 LAB — INSULIN, RANDOM: Insulin: 17.5 u[IU]/mL

## 2019-04-06 ENCOUNTER — Other Ambulatory Visit: Payer: Self-pay

## 2019-04-06 ENCOUNTER — Ambulatory Visit (INDEPENDENT_AMBULATORY_CARE_PROVIDER_SITE_OTHER): Payer: PPO

## 2019-04-06 VITALS — Temp 97.5°F

## 2019-04-06 DIAGNOSIS — Z23 Encounter for immunization: Secondary | ICD-10-CM | POA: Diagnosis not present

## 2019-04-06 NOTE — Progress Notes (Signed)
Patient presents to the office forHDFlu Vaccine. Vaccine administered toLEFTDeltoid withoutanycomplication. Temperature taken and recorded 

## 2019-05-03 ENCOUNTER — Other Ambulatory Visit: Payer: Self-pay | Admitting: Cardiology

## 2019-05-03 MED ORDER — DOFETILIDE 500 MCG PO CAPS: 500.0000 ug | ORAL_CAPSULE | Freq: Two times a day (BID) | ORAL | 1 refills | Status: DC

## 2019-05-08 NOTE — Progress Notes (Signed)
PCP:  [name], MD Primary Cardiologist: [name] Electrophysiologist: [name], MD   Bradley Ferguson is a 68 y.o. male with past medical history of Atrial fibrillation, PAD, HLD, COPD, and tobacco abuse who presents today for routine electrophysiology followup. They are seen for Dr. Curt Bears.   Since last being seen in our clinic, the patient reports doing very well.    Unfortunately, do to problems with his mail service, patient's last dose of Tikosyn was 05/07/2019 at ~ 9-10 pm. He is expecting his mail order "any day". He denies SOB, dizziness, or lightheadedness. No syncope or near syncope. He does not feel like he has had afib, but doesn't usually feel it when he is "out of rhythm"  The patient feels that he is tolerating medications without difficulties and is otherwise without complaint today.   Past Medical History:  Diagnosis Date  . Anxiety   . Atrial fibrillation ([place])   . Blood transfusion without reported diagnosis   . Cataract   . Depression   . Emphysema of lung ([place])   . Heart murmur   . Hyperlipidemia   . PVD (peripheral vascular disease) ([place])   . Raynaud's syndrome    Past Surgical History:  Procedure Laterality Date  . ATRIAL FIBRILLATION ABLATION    . CATARACT EXTRACTION Right   . FRACTURE SURGERY    . LOWER EXTREMITY INTERVENTION N/A 04/27/2017   Procedure: LOWER EXTREMITY INTERVENTION;  Surgeon: Lorretta Harp, MD;  Location: [facility];  Service: Cardiovascular;  Laterality: N/A;  . PERIPHERAL VASCULAR ATHERECTOMY Right 04/27/2017   Procedure: PERIPHERAL VASCULAR ATHERECTOMY;  Surgeon: Lorretta Harp, MD;  Location: [facility];  Service: Cardiovascular;  Laterality: Right;  Iliac  . PERIPHERAL VASCULAR INTERVENTION Right 04/27/2017   Procedure: PERIPHERAL VASCULAR INTERVENTION;  Surgeon: Lorretta Harp, MD;  Location: [facility];  Service: Cardiovascular;  Laterality: Right;  Iliac    Current Outpatient  Medications  Medication Sig Dispense Refill  . ALPRAZolam (XANAX) 1 MG tablet Take 1/2-1 tablet 2 - 3 x /day ONLY if needed for Anxiety Attack &  limit to 5 days /week to avoid addiction 90 tablet 0  . aspirin EC 81 MG tablet Take 81 mg by mouth daily.    Marland Kitchen atorvastatin (LIPITOR) 80 MG tablet Take 1 tablet daily for Cholesterol 90 tablet 1  . CHOLECALCIFEROL PO Take 10,000 Units by mouth daily.    Marland Kitchen dofetilide (TIKOSYN) 500 MCG capsule Take 1 capsule (500 mcg total) by mouth 2 (two) times daily. Please make yearly appt with Dr. Curt Bears for December. 1st attempt 60 capsule 1  . ketoconazole (NIZORAL) 2 % cream Apply to rash 1 to 2 x /day 30 g 3  . rivaroxaban (XARELTO) 20 MG TABS tablet Take 1 tablet daily for Atrial Fibrillation 90 tablet 3   No current facility-administered medications for this visit.     Allergies  Allergen Reactions  . Effexor [Venlafaxine]     Feels bad  . Wellbutrin [Bupropion]     sleepy  . Zoloft [Sertraline Hcl]     Felt bad    Social History   Socioeconomic History  . Marital status: Legally Separated    Spouse name: Not on file  . Number of children: 1  . Years of education: Not on file  . Highest education level: Not on file  Occupational History  . 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 Financial resource strain: Not on  file  . Food insecurity    Worry: Not on file    Inability: Not on file  . Transportation needs    Medical: Not on file    Non-medical: Not on file  Tobacco Use  . Smoking status: Current Every Day Smoker    Packs/day: 1.00    Years: 49.00    Pack years: 49.00    Types: Cigarettes    Start date: 61  . Smokeless tobacco: Never Used  . Tobacco comment: Currently down to 3/4 pack   Substance and Sexual Activity  . Alcohol use: Yes    Alcohol/week: 4.0 standard drinks    Types: 4 Standard drinks or equivalent per week    Comment: 3-4 drinks per week  . Drug use: Never  . Sexual activity: Not on file   Lifestyle  . Physical activity    Days per week: Not on file    Minutes per session: Not on file  . Stress: Not on file  Relationships  . Social Herbalist on phone: Not on file    Gets together: Not on file    Attends religious service: Not on file    Active member of club or organization: Not on file    Attends meetings of clubs or organizations: Not on file    Relationship status: Not on file  . Intimate partner violence    Fear of current or ex partner: Not on file    Emotionally abused: Not on file    Physically abused: Not on file    Forced sexual activity: Not on file  Other Topics Concern  . Not on file  Social History Narrative  . Not on file     Review of Systems: General: No chills, fever, night sweats or weight changes  Cardiovascular:  No chest pain, dyspnea on exertion, edema, orthopnea, palpitations, paroxysmal nocturnal dyspnea Dermatological: No rash, lesions or masses Respiratory: No cough, dyspnea Urologic: No hematuria, dysuria Abdominal: No nausea, vomiting, diarrhea, bright red blood per rectum, melena, or hematemesis Neurologic: No visual changes, weakness, changes in mental status All other systems reviewed and are otherwise negative except as noted above.  Physical Exam: Vitals:   05/09/19 0805  BP: (!) 144/72  Weight: 188 lb (85.3 kg)  Height: 6\' 1"  (1.854 m)    GEN- The patient is well appearing, alert and oriented x 3 today.   HEENT: normocephalic, atraumatic; sclera clear, conjunctiva pink; hearing intact; oropharynx clear; neck supple, no JVP Lymph- no cervical lymphadenopathy Lungs- Clear to ausculation bilaterally, normal work of breathing.  No wheezes, rales, rhonchi Heart- Regular rate and rhythm, no murmurs, rubs or gallops, PMI not laterally displaced GI- soft, non-tender, non-distended, bowel sounds present, no hepatosplenomegaly Extremities- no clubbing, cyanosis, or edema; DP/PT/radial pulses 2+ bilaterally MS- no  significant deformity or atrophy Skin- warm and dry, no rash or lesion Psych- euthymic mood, full affect Neuro- strength and sensation are intact  EKG is ordered. Personal review of EKG from today shows NSR at 86 bpm with stable QTc ~460 ms. EKG from 03/28/2019 reviewed (Pt ON tikosyn at that time). QTc stable ~ 460-470 ms.  Assessment and Plan:  1. Persistent atrial fibrillation Continue Xarelto for CHA2DS2VASC of at least 4  He is taking Tikosyn 500 mcg BID, but has not had a dose since Saturday night.  Discussed with Dr. Curt Bears, if patient can get a dose this morning, can restart, with EKG after 3 doses (tomorrow am/afternoon).  If patient  can NOT get a dose this morning, will need to be admitted and observed to restart.   We have sent him a 3 month supply to [facility].  BMET and Mg this am.   2. HLD Continue statin  3. HTN Continue current regimen  4. PAD Follows with Dr. [name]  Discussed case with Dr. Curt Bears. Follow up in Afib clinic tomorrow am for EKG. If QTc long, or pt unable to get this am, will need to consider admission for re-initiation of tikosyn. Otherwise, will plan follow up in 6 months.   Shirley Friar, PA-C  05/09/19 8:16 AM

## 2019-05-09 ENCOUNTER — Ambulatory Visit: Payer: PPO | Admitting: Student

## 2019-05-09 ENCOUNTER — Other Ambulatory Visit: Payer: Self-pay

## 2019-05-09 ENCOUNTER — Encounter (INDEPENDENT_AMBULATORY_CARE_PROVIDER_SITE_OTHER): Payer: Self-pay

## 2019-05-09 VITALS — BP 144/72 | Ht 73.0 in | Wt 188.0 lb

## 2019-05-09 DIAGNOSIS — I4819 Other persistent atrial fibrillation: Secondary | ICD-10-CM

## 2019-05-09 LAB — MAGNESIUM: Magnesium: 2 mg/dL (ref 1.6–2.3)

## 2019-05-09 LAB — BASIC METABOLIC PANEL
BUN/Creatinine Ratio: 16 (ref 10–24)
BUN: 17 mg/dL (ref 8–27)
CO2: 22 mmol/L (ref 20–29)
Calcium: 9.2 mg/dL (ref 8.6–10.2)
Chloride: 102 mmol/L (ref 96–106)
Creatinine, Ser: 1.09 mg/dL (ref 0.76–1.27)
GFR calc Af Amer: 81 mL/min/{1.73_m2} (ref >59)
GFR calc non Af Amer: 70 mL/min/{1.73_m2} (ref >59)
Glucose: 138 mg/dL — ABNORMAL HIGH (ref 65–99)
Potassium: 4.4 mmol/L (ref 3.5–5.2)
Sodium: 138 mmol/L (ref 134–144)

## 2019-05-09 MED ORDER — DOFETILIDE 500 MCG PO CAPS: 500.0000 ug | ORAL_CAPSULE | Freq: Two times a day (BID) | ORAL | 1 refills | Status: DC

## 2019-05-09 NOTE — Patient Instructions (Signed)
Medication Instructions:   Your physician recommends that you continue on your current medications as directed. Please refer to the Current Medication list given to you today.  TIKOSYN INSTRUCTIONS ONCE RECEIVED FROM PHARMACY:  TAKE TIKOSYN AT 10 AM THIS  MORNING   TAKE 9:30 PM TONIGHT  TAKE TIKOSYN 9 AM TOMORROW MORNING  BEFORE AFIB CLINIC APPT 11:30 AM  *If you need a refill on your cardiac medications before your next appointment, please call your pharmacy*  Lab Work:  BM[facility] AND [place]   If you have labs (blood work) drawn today and your tests are completely normal, you will receive your results only by: Marland Kitchen MyChart Message (if you have MyChart) OR . A paper copy in the mail If you have any lab test that is abnormal or we need to change your treatment, we will call you to review the results.  Testing/Procedures: NONE ORDERED  TODAY    Follow-Up: At [facility], you and your health needs are our priority.  As part of our continuing mission to provide you with exceptional heart care, we have created designated Provider Care Teams.  These Care Teams include your primary Cardiologist (physician) and Advanced Practice Providers (APPs -  Physician Assistants and Nurse Practitioners) who all work together to provide you with the care you need, when you need it.  Your next appointment:  AFIB CLINIC AS SCHEDULED   Other Instructions:

## 2019-05-10 ENCOUNTER — Ambulatory Visit (HOSPITAL_COMMUNITY)
Admission: RE | Admit: 2019-05-10 | Discharge: 2019-05-10 | Disposition: A | Discharge status: Home / Self Care | Payer: PPO | Source: Ambulatory Visit | Attending: Physician Assistant | Admitting: Physician Assistant

## 2019-05-10 VITALS — BP 118/66 | HR 69 | Ht 73.0 in | Wt 188.8 lb

## 2019-05-10 DIAGNOSIS — I7300 Raynaud's syndrome without gangrene: Secondary | ICD-10-CM | POA: Diagnosis not present

## 2019-05-10 DIAGNOSIS — I4819 Other persistent atrial fibrillation: Secondary | ICD-10-CM | POA: Diagnosis not present

## 2019-05-10 DIAGNOSIS — J439 Emphysema, unspecified: Secondary | ICD-10-CM | POA: Diagnosis not present

## 2019-05-10 DIAGNOSIS — Z79899 Other long term (current) drug therapy: Secondary | ICD-10-CM | POA: Diagnosis not present

## 2019-05-10 DIAGNOSIS — I4892 Unspecified atrial flutter: Secondary | ICD-10-CM | POA: Diagnosis not present

## 2019-05-10 DIAGNOSIS — E785 Hyperlipidemia, unspecified: Secondary | ICD-10-CM | POA: Diagnosis not present

## 2019-05-10 DIAGNOSIS — I10 Essential (primary) hypertension: Secondary | ICD-10-CM | POA: Diagnosis not present

## 2019-05-10 DIAGNOSIS — F419 Anxiety disorder, unspecified: Secondary | ICD-10-CM | POA: Insufficient documentation

## 2019-05-10 DIAGNOSIS — D6869 Other thrombophilia: Secondary | ICD-10-CM | POA: Diagnosis not present

## 2019-05-10 DIAGNOSIS — H269 Unspecified cataract: Secondary | ICD-10-CM | POA: Insufficient documentation

## 2019-05-10 DIAGNOSIS — I739 Peripheral vascular disease, unspecified: Secondary | ICD-10-CM | POA: Insufficient documentation

## 2019-05-10 DIAGNOSIS — F17210 Nicotine dependence, cigarettes, uncomplicated: Secondary | ICD-10-CM | POA: Insufficient documentation

## 2019-05-10 DIAGNOSIS — Z7982 Long term (current) use of aspirin: Secondary | ICD-10-CM | POA: Insufficient documentation

## 2019-05-10 NOTE — Progress Notes (Addendum)
Primary Care Physician: [name], MD Referring Physician: Dr. Curt Bears Primary Cardiologist: Dr [name] Primary EP: Dr Duane Lope is a 68 y.o. male with a h/o atrial flutter ablation by Dr. Lovena Le in 2011, persistent atrial fibrillation, PAD, HLD, COPD, and tobacco abuse who presents for follow up in the AF Clinic. Patient reports that his dofetilide was delayed due to a problem with the mail service and he missed two doses. He was seen by Oda Kilts PA-C and Dr Curt Bears who advised him to restart dofetilide that morning and come to the AF clinic for ECG. If he couldn't resume that morning, he would need readmission for dofetilide loading. Patient has taken both doses yesterday and one this AM. He is on Xarelto for a CHADS2VASC score of 4.  Today, he denies symptoms of palpitations, chest pain, shortness of breath, orthopnea, PND, lower extremity edema, dizziness, presyncope, syncope, or neurologic sequela. The patient is tolerating medications without difficulties and is otherwise without complaint today.   Past Medical History:  Diagnosis Date   Anxiety    Atrial fibrillation ([place])    Blood transfusion without reported diagnosis    Cataract    Depression    Emphysema of lung ([place])    Heart murmur    Hyperlipidemia    PVD (peripheral vascular disease) ([place])    Raynaud's syndrome    Past Surgical History:  Procedure Laterality Date   ATRIAL FIBRILLATION ABLATION     CATARACT EXTRACTION Right    FRACTURE SURGERY     LOWER EXTREMITY INTERVENTION N/A 04/27/2017   Procedure: LOWER EXTREMITY INTERVENTION;  Surgeon: Lorretta Harp, MD;  Location: [facility];  Service: Cardiovascular;  Laterality: N/A;   PERIPHERAL VASCULAR ATHERECTOMY Right 04/27/2017   Procedure: PERIPHERAL VASCULAR ATHERECTOMY;  Surgeon: Lorretta Harp, MD;  Location: [facility];  Service: Cardiovascular;  Laterality: Right;  Iliac   PERIPHERAL VASCULAR  INTERVENTION Right 04/27/2017   Procedure: PERIPHERAL VASCULAR INTERVENTION;  Surgeon: Lorretta Harp, MD;  Location: [facility];  Service: Cardiovascular;  Laterality: Right;  Iliac    Current Outpatient Medications  Medication Sig Dispense Refill   ALPRAZolam (XANAX) 1 MG tablet Take 1/2-1 tablet 2 - 3 x /day ONLY if needed for Anxiety Attack &  limit to 5 days /week to avoid addiction 90 tablet 0   aspirin EC 81 MG tablet Take 81 mg by mouth daily.     atorvastatin (LIPITOR) 80 MG tablet Take 1 tablet daily for Cholesterol 90 tablet 1   CHOLECALCIFEROL PO Take 10,000 Units by mouth daily.     dofetilide (TIKOSYN) 500 MCG capsule Take 1 capsule (500 mcg total) by mouth 2 (two) times daily. 180 capsule 1   ketoconazole (NIZORAL) 2 % cream Apply to rash 1 to 2 x /day 30 g 3   rivaroxaban (XARELTO) 20 MG TABS tablet Take 1 tablet daily for Atrial Fibrillation 90 tablet 3   No current facility-administered medications for this visit.     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, product/process development strain: Not on file   Food insecurity    Worry: Not on file    Inability: Not on file   Transportation needs    Medical: Not on file    Non-medical: Not on file  Tobacco Use   Smoking status: Current Every Day Smoker    Packs/day: 1.00    Years: 49.00    Pack years: 49.00    Types: Cigarettes    Start date: 1971   Smokeless tobacco: Never Used   Tobacco comment: Currently down to 3/4 pack   Substance and Sexual Activity   Alcohol use: Yes    Alcohol/week: 4.0  standard drinks    Types: 4 Standard drinks or equivalent per week    Comment: 3-4 drinks per week   Drug use: Never   Sexual activity: Not on file  Lifestyle   Physical activity    Days per week: Not on file    Minutes per session: Not on file   Stress: Not on file  Relationships   Social connections    Talks on phone: Not on file    Gets together: Not on file    Attends religious service: Not on file    Active member of club or organization: Not on file    Attends meetings of clubs or organizations: Not on file    Relationship status: Not on file   Intimate partner violence    Fear of current or ex partner: Not on file    Emotionally abused: Not on file    Physically abused: Not on file    Forced sexual activity: Not on file  Other Topics Concern   Not on file  Social History Narrative   Not on file    Family History  Problem Relation Age of Onset   Cancer Mother        small cell lung cancer   Cancer Father        unsure type   Colon cancer Neg Hx    Rectal cancer Neg Hx     ROS- All systems are reviewed and negative except as per the HPI above  Physical Exam: There were no vitals filed for this visit. Wt Readings from Last 3 Encounters:  05/09/19 188 lb (85.3 kg)  03/28/19 189 lb (85.7 kg)  12/10/18 187 lb (84.8 kg)    Labs: Lab Results  Component Value Date   NA 138 05/09/2019   K 4.4 05/09/2019   CL 102 05/09/2019   CO2 22 05/09/2019   GLUCOSE 138 (H) 05/09/2019   BUN 17 05/09/2019   CREATININE 1.09 05/09/2019   CALCIUM 9.2 05/09/2019   MG 2.0 05/09/2019   Lab Results  Component Value Date   INR 1.0 04/16/2017   Lab Results  Component Value Date   CHOL 126 03/28/2019   HDL 36 (L) 03/28/2019   LDLCALC 67 03/28/2019   TRIG 157 (H) 03/28/2019   GEN- The patient is well appearing, alert and oriented x 3 today.   HEENT-head normocephalic, atraumatic, sclera clear, conjunctiva pink, hearing intact, trachea midline. Lungs- Clear to  ausculation bilaterally, normal work of breathing Heart- Regular rate and rhythm, no murmurs, rubs or gallops  GI- soft, NT, ND, + BS Extremities- no clubbing, cyanosis, or edema MS- no significant deformity or atrophy Skin- no rash or lesion Psych- euthymic mood, full affect Neuro- strength and sensation are intact   EKG-  SR HR 69, RBBB, PR 168, QRS 126, QRS 475 (stable from previous ECGs)  Echo 03/11/18 - Left ventricle: The cavity size was normal. Wall thickness was   increased in a pattern of mild LVH. Systolic function was normal.   The estimated ejection fraction was in the range of 60% to 65%.   Wall motion was normal; there were no regional wall motion   abnormalities. - Aortic valve: Trileaflet; moderately thickened, moderately   calcified leaflets. Valve mobility was restricted. - Mitral valve: Calcified annulus. - Right atrium: The atrium was mildly dilated. - Tricuspid valve: There was mild regurgitation. - Pericardium, extracardiac: A trivial pericardial effusion was   identified posterior to the heart.  Epic records reviewed.   Assessment and Plan: 1. Persistent atrial fibrillation Patient appears to be maintaining SR. He has a new prescription for the medication.  Continue dofetilide 500 mcg BID. QT stable. Recent Bmet/mag reviewed.  Continue xarelto 20 mg daily.  This patients CHA2DS2-VASc Score and unadjusted Ischemic Stroke Rate (% per year) is equal to 4.8 % stroke rate/year from a score of 4  Above score calculated as 1 point each if present [CHF, HTN, DM, Vascular=MI/PAD/Aortic Plaque, Age if 65-74, or Male] Above score calculated as 2 points each if present [Age > 75, or Stroke/TIA/TE]  2. HTN Stable, no changes today.  3. PAD Followed by Dr [name].   Follow up with Dr Curt Bears in 6 months.   [facility] [address] [place], [place] [address] [phone]

## 2019-05-10 NOTE — Progress Notes (Signed)
Thanks

## 2019-06-30 ENCOUNTER — Other Ambulatory Visit: Payer: Self-pay

## 2019-06-30 ENCOUNTER — Ambulatory Visit: Payer: PPO | Admitting: Adult Health Nurse Practitioner

## 2019-06-30 ENCOUNTER — Ambulatory Visit (INDEPENDENT_AMBULATORY_CARE_PROVIDER_SITE_OTHER): Payer: PPO | Admitting: Physician Assistant

## 2019-06-30 VITALS — BP 136/84 | HR 96 | Temp 97.2°F | Resp 16 | Ht 73.0 in | Wt 191.6 lb

## 2019-06-30 DIAGNOSIS — D6869 Other thrombophilia: Secondary | ICD-10-CM

## 2019-06-30 DIAGNOSIS — R7309 Other abnormal glucose: Secondary | ICD-10-CM

## 2019-06-30 DIAGNOSIS — E782 Mixed hyperlipidemia: Secondary | ICD-10-CM | POA: Diagnosis not present

## 2019-06-30 DIAGNOSIS — I700 Atherosclerosis of aorta: Secondary | ICD-10-CM

## 2019-06-30 DIAGNOSIS — I480 Paroxysmal atrial fibrillation: Secondary | ICD-10-CM

## 2019-06-30 DIAGNOSIS — E559 Vitamin D deficiency, unspecified: Secondary | ICD-10-CM

## 2019-06-30 DIAGNOSIS — I739 Peripheral vascular disease, unspecified: Secondary | ICD-10-CM

## 2019-06-30 DIAGNOSIS — J449 Chronic obstructive pulmonary disease, unspecified: Secondary | ICD-10-CM

## 2019-06-30 DIAGNOSIS — F319 Bipolar disorder, unspecified: Secondary | ICD-10-CM | POA: Diagnosis not present

## 2019-06-30 DIAGNOSIS — R0989 Other specified symptoms and signs involving the circulatory and respiratory systems: Secondary | ICD-10-CM

## 2019-06-30 DIAGNOSIS — F17200 Nicotine dependence, unspecified, uncomplicated: Secondary | ICD-10-CM | POA: Diagnosis not present

## 2019-06-30 MED ORDER — KETOCONAZOLE 2 % EX CREA: TOPICAL_CREAM | CUTANEOUS | 1 refills | Status: DC

## 2019-06-30 MED ORDER — TRIAMCINOLONE ACETONIDE 0.5 % EX CREA: 1.0000 "application " | TOPICAL_CREAM | Freq: Two times a day (BID) | CUTANEOUS | 1 refills | Status: DC

## 2019-06-30 NOTE — Patient Instructions (Addendum)
INFORMATION ABOUT YOUR STEROID INHALER  Can do steroid inhaler, NEED TO DO DAILY, this is NOT a rescue inhaler so if you are acutely short of breath please use your albuterol or call 911.  Do 1 puff ONCE a day.  Do before you brush your teeth OR wash your mouth afterwards.  IF YOU DO NOT [place] YOUR MOUTH OUT IT CAN CAUSE YEAST Can do 2 tsp vinegar with water and switch to help prevent yeast or help yeast in your mouth.   Go to the ER if any chest pain, shortness of breath, nausea, dizziness, severe HA, changes vision/speech  Follow up with pulmonary  SMOKING CESSATION WITH CHANTIX  [employer] society  204-236-3130 for more information or for a free program for smoking cessation help.   You can call QUIT SMART 1-800-QUIT-NOW for free nicotine patches or replacement therapy- if they are out- keep calling  [facility] Can call for smoking cessation classes, [phone]  If you have a smart phone, please look up Smoke Free app, this will help you stay on track and give you information about money you have saved, life that you have gained back and a ton of more information.   We are giving you chantix for smoking cessation. You can do it! And we are here to help! You may have heard some scary side effects about chantix, the three most common I hear about are nausea, crazy dreams and depression.  However, I like for my patients to try to stay on 1/2 a tablet twice a day rather than one tablet twice a day as normally prescribed. This helps decrease the chances of side effects and helps save money by making a one month prescription last two months  Please start the prescription this way:  Start 1/2 tablet by mouth once daily after food with a full glass of water for 3 days Then do 1/2 tablet by mouth twice daily for 4 days. During this first week you can smoke, but try to stop after this week.  At this point we have several options: 1) continue on 1/2 tablet twice a day-  which I encourage you to do. You can stay on this dose the rest of the time on the medication or if you still feel the need to smoke you can do one of the two options below. 2) do one tablet in the morning and 1/2 in the evening which helps decrease dreams. 3) do one tablet twice a day.   What if I miss a dose? If you miss a dose, take it as soon as you can. If it is almost time for your next dose, take only that dose. Do not take double or extra doses.  What should I watch for while using this medicine? Visit your doctor or health care professional for regular check ups. Ask for ongoing advice and encouragement from your doctor or healthcare professional, friends, and family to help you quit. If you smoke while on this medication, quit again  Your mouth may get dry. Chewing sugarless gum or hard candy, and drinking plenty of water may help. Contact your doctor if the problem does not go away or is severe.  You may get drowsy or dizzy. Do not drive, use machinery, or do anything that needs mental alertness until you know how this medicine affects you. Do not stand or sit up quickly, especially if you are an older patient.    The use of this medicine may increase  the chance of suicidal thoughts or actions. Pay special attention to how you are responding while on this medicine. Any worsening of mood, or thoughts of suicide or dying should be reported to your health care professional right away.  ADVANTAGES OF QUITTING SMOKING  Within 20 minutes, blood pressure decreases. Your pulse is at normal level.  After 8 hours, carbon monoxide levels in the blood return to normal. Your oxygen level increases.  After 24 hours, the chance of having a heart attack starts to decrease. Your breath, hair, and body stop smelling like smoke.  After 48 hours, damaged nerve endings begin to recover. Your sense of taste and smell improve.  After 72 hours, the body is virtually free of nicotine. Your bronchial  tubes relax and breathing becomes easier.  After 2 to 12 weeks, lungs can hold more air. Exercise becomes easier and circulation improves.  After 1 year, the risk of coronary heart disease is cut in half.  After 5 years, the risk of stroke falls to the same as a nonsmoker.  After 10 years, the risk of lung cancer is cut in half and the risk of other cancers decreases significantly.  After 15 years, the risk of coronary heart disease drops, usually to the level of a nonsmoker.  You will have extra money to spend on things other than cigarettes.

## 2019-06-30 NOTE — Progress Notes (Signed)
FOLLOW UP Assessment:   Labile hypertension -     CBC with Diff -     COMPLETE METABOLIC PANEL WITH GFR -     TSH - continue medications, DASH diet, exercise and monitor at home. Call if greater than 130/80.   Hyperlipidemia, mixed -     Lipid Profile check lipids decrease fatty foods increase activity.   Abnormal glucose -     Hemoglobin A1c ([facility]) Discussed disease progression and risks Discussed diet/exercise, weight management and risk modification  Vitamin D deficiency Continue same dose, at goal  Acquired thrombophilia ([facility]) Check CBC, monitor  Claudication in peripheral vascular disease ([facility]) Continue cardio follow up, increase walking  Bipolar I disorder ([facility]) Stable at this time  Paroxysmal atrial fibrillation ([place]) Continue xarelto, continue cardio follow up  TOBACCO ABUSE Declines chantix, given information, will continue to try to cut back  Chronic obstructive pulmonary disease, unspecified COPD type ([place]) Given samples of trelegy, has follow up with pulmonary   Atherosclerosis of aorta ([place]) Control blood pressure, cholesterol, glucose, increase exercise.  Stop smoking  rash -     ketoconazole (NIZORAL) 2 % cream; Apply to rash 1 to 2 x /day -     triamcinolone cream (KENALOG) 0.5 %; Apply 1 application topically 2 (two) times daily.    Over 30 minutes of exam, counseling, chart review, and critical decision making was performed  Future Appointments  Date Time Provider [place]  07/07/2019 11:45 AM Marshell Garfinkel, MD [facility] None  09/29/2019  2:30 PM [name], MD [facility] None  04/30/2020  2:00 PM [name], MD [facility] None     Plan:   During the course of the visit the patient was educated and counseled about appropriate screening and preventive services including:    Pneumococcal vaccine   Influenza vaccine  Prevnar 13  Td vaccine  Screening electrocardiogram  Colorectal cancer  screening  Diabetes screening  Glaucoma screening  Nutrition counseling    Subjective:  Bradley Ferguson is a 68 y.o. male who presents for 3 month follow up for HTN, hyperlipidemia, prediabetes, and vitamin D Def.   Hx/o pAfib s/p ablation in 2011 and was seen in 2019 for CPE and was back in Afib and started on Xarelto, and was admitted for initiation of Tikosyn with successful conversion in Oct 2019. He is followed by Dr. Rayann Heman and also Dr. [name] for PVD with claudification, peripheral angiography on  04/27/17 revealing a 99% calcified eccentric proximal right common iliac artery stenosis and bilateral long segment SFA chronic total occlusions which were highly calcified. Pt underwent direct back orbital rotational atherectomy, PTCA and cover stenting using a 7 mm x 39 mm long VBX covered stent.  He does have hx of mixed bipolar type depression with ADD and anxiety though in recently years has been apparently stable on xanax, recently taking rarely, averages 1/2 tab 2-3 times a week. He reports doing well with current regimen.   he currently continues to smoke 0.75 pack a day; discussed risks associated with smoking, patient is not ready to quit, but slowly tapering down. He had a CT lung screen 12/2018.   BMI is Body mass index is 25.28 kg/m., he has been working on diet, exercise limited by cardiovascular disease (PAD/claudication).  Wt Readings from Last 3 Encounters:  06/30/19 191 lb 9.6 oz (86.9 kg)  05/10/19 188 lb 12.8 oz (85.6 kg)  05/09/19 188 lb (85.3 kg)   His blood pressure has been controlled at home,  today their BP is BP: 136/84 He does not workout. He denies chest pain, shortness of breath, dizziness.   He is on cholesterol medication (atorvastatin 80 mg daily) and denies myalgias. His cholesterol is at goal. The cholesterol last visit was:   Lab Results  Component Value Date   CHOL 126 03/28/2019   HDL 36 (L) 03/28/2019   LDLCALC 67 03/28/2019   TRIG 157 (H)  03/28/2019   CHOLHDL 3.5 03/28/2019   He has not been working on diet and exercise for prediabetes, and denies increased appetite, nausea, polydipsia, polyuria, visual disturbances, vomiting and weight loss. Last A1C in the office was:  Lab Results  Component Value Date   HGBA1C 5.7 (H) 03/28/2019   Last GFR Lab Results  Component Value Date   GFRNONAA 70 05/09/2019   Patient is on Vitamin D supplement, taking 10000 IU daily    Lab Results  Component Value Date   VD25OH 55 03/28/2019      Medication Review:   Current Outpatient Medications (Cardiovascular):  .  atorvastatin (LIPITOR) 80 MG tablet, Take 1 tablet daily for Cholesterol .  dofetilide (TIKOSYN) 500 MCG capsule, Take 1 capsule (500 mcg total) by mouth 2 (two) times daily.   Current Outpatient Medications (Analgesics):  .  aspirin EC 81 MG tablet, Take 81 mg by mouth daily.  Current Outpatient Medications (Hematological):  .  rivaroxaban (XARELTO) 20 MG TABS tablet, Take 1 tablet daily for Atrial Fibrillation  Current Outpatient Medications (Other):  Marland Kitchen  ALPRAZolam (XANAX) 1 MG tablet, Take 1/2-1 tablet 2 - 3 x /day ONLY if needed for Anxiety Attack &  limit to 5 days /week to avoid addiction .  CHOLECALCIFEROL PO, Take 10,000 Units by mouth daily. Marland Kitchen  ketoconazole (NIZORAL) 2 % cream, Apply to rash 1 to 2 x /day  Allergies: Allergies  Allergen Reactions  . Effexor [Venlafaxine]     Feels bad  . Wellbutrin [Bupropion]     sleepy  . Zoloft [Sertraline Hcl]     Felt bad    Current Problems (verified) has ANXIETY DEPRESSION; TOBACCO ABUSE; RAYNAUDS SYNDROME; Attention deficit disorder; Dental caries; Bipolar I disorder ([place]); Atrial fibrillation ([place]); Hyperlipidemia, mixed; Vitamin D deficiency; Labile hypertension; Other abnormal glucose (prediabetes); BPH; Claudication in peripheral vascular disease ([place]); Visit for monitoring Tikosyn therapy; Colon polyps; and Acquired thrombophilia ([place]) on their problem  list.  Surgical History: reviewed and unchanged Family History: reviewed and unchanged Social History: reviewed and unchanged   Objective:   Today's Vitals   06/30/19 1210  BP: 136/84  Pulse: 96  Resp: 16  Temp: (!) 97.2 F (36.2 C)  Weight: 191 lb 9.6 oz (86.9 kg)  Height: 6\' 1"  (1.854 m)   Body mass index is 25.28 kg/m.  General appearance: alert, no distress, WD/WN, male HEENT: normocephalic, sclerae anicteric, TMs pearly, nares patent, no discharge or erythema, pharynx normal Oral cavity: MMM, no lesions Neck: supple, no lymphadenopathy, no thyromegaly, no masses Heart: RRR, normal S1, S2, no murmurs Lungs: CTA bilaterally, mildly coarse over bronchioles anteriorly, no wheezes, rhonchi, or rales Abdomen: +bs, soft, non tender, non distended, no masses, no hepatomegaly, no splenomegaly Musculoskeletal: nontender, no swelling, no obvious deformity Extremities: no edema, no cyanosis, no clubbing Pulses: 2+ symmetric, upper extreamities, LE eripheral pulses thready and without edema. No aortic or femoral bruits. Neurological: alert, oriented x 3, CN2-12 intact, strength normal upper extremities and lower extremities, sensation normal throughout, DTRs 2+ throughout, no cerebellar signs, gait normal Psychiatric: normal affect,  behavior normal, pleasant     Vicie Mutters, PA-C   06/30/2019

## 2019-07-01 LAB — CBC WITH DIFFERENTIAL/PLATELET
Absolute Monocytes: 731 cells/uL (ref 200–950)
Basophils Absolute: 50 cells/uL (ref 0–200)
Basophils Relative: 0.7 %
Eosinophils Absolute: 490 cells/uL (ref 15–500)
Eosinophils Relative: 6.9 %
HCT: 40.2 % (ref 38.5–50.0)
Hemoglobin: 13.3 g/dL (ref 13.2–17.1)
Lymphs Abs: 1789 cells/uL (ref 850–3900)
MCH: 30.1 pg (ref 27.0–33.0)
MCHC: 33.1 g/dL (ref 32.0–36.0)
MCV: 91 fL (ref 80.0–100.0)
MPV: 11.7 fL (ref 7.5–12.5)
Monocytes Relative: 10.3 %
Neutro Abs: 4040 cells/uL (ref 1500–7800)
Neutrophils Relative %: 56.9 %
Platelets: 160 10*3/uL (ref 140–400)
RBC: 4.42 10*6/uL (ref 4.20–5.80)
RDW: 12.9 % (ref 11.0–15.0)
Total Lymphocyte: 25.2 %
WBC: 7.1 10*3/uL (ref 3.8–10.8)

## 2019-07-01 LAB — COMPLETE METABOLIC PANEL WITH GFR
AG Ratio: 1.8 (calc) (ref 1.0–2.5)
ALT: 21 U/L (ref 9–46)
AST: 23 U/L (ref 10–35)
Albumin: 4.3 g/dL (ref 3.6–5.1)
Alkaline phosphatase (APISO): 66 U/L (ref 35–144)
BUN: 16 mg/dL (ref 7–25)
CO2: 29 mmol/L (ref 20–32)
Calcium: 9.5 mg/dL (ref 8.6–10.3)
Chloride: 105 mmol/L (ref 98–110)
Creat: 0.95 mg/dL (ref 0.70–1.25)
GFR, Est African American: 96 mL/min/{1.73_m2} (ref >=60)
GFR, Est Non African American: 82 mL/min/{1.73_m2} (ref >=60)
Globulin: 2.4 g/dL (calc) (ref 1.9–3.7)
Glucose, Bld: 97 mg/dL (ref 65–99)
Potassium: 4.5 mmol/L (ref 3.5–5.3)
Sodium: 141 mmol/L (ref 135–146)
Total Bilirubin: 0.6 mg/dL (ref 0.2–1.2)
Total Protein: 6.7 g/dL (ref 6.1–8.1)

## 2019-07-01 LAB — LIPID PANEL
Cholesterol: 127 mg/dL (ref <200)
HDL: 40 mg/dL (ref >=40)
LDL Cholesterol (Calc): 64 mg/dL (calc)
Non-HDL Cholesterol (Calc): 87 mg/dL (calc) (ref <130)
Total CHOL/HDL Ratio: 3.2 (calc) (ref <5.0)
Triglycerides: 151 mg/dL — ABNORMAL HIGH (ref <150)

## 2019-07-01 LAB — TSH: TSH: 1.23 mIU/L (ref 0.40–4.50)

## 2019-07-01 LAB — HEMOGLOBIN A1C
Hgb A1c MFr Bld: 5.8 % of total Hgb — ABNORMAL HIGH (ref <5.7)
Mean Plasma Glucose: 120 (calc)
eAG (mmol/L): 6.6 (calc)

## 2019-07-05 ENCOUNTER — Other Ambulatory Visit: Payer: Self-pay | Admitting: Internal Medicine

## 2019-07-05 DIAGNOSIS — E782 Mixed hyperlipidemia: Secondary | ICD-10-CM

## 2019-07-05 DIAGNOSIS — I480 Paroxysmal atrial fibrillation: Secondary | ICD-10-CM

## 2019-07-05 MED ORDER — RIVAROXABAN 20 MG PO TABS: ORAL_TABLET | ORAL | 1 refills | Status: DC

## 2019-07-05 MED ORDER — ATORVASTATIN CALCIUM 80 MG PO TABS: ORAL_TABLET | ORAL | 1 refills | Status: DC

## 2019-07-07 ENCOUNTER — Encounter: Payer: Self-pay | Admitting: Pulmonary Disease

## 2019-07-07 ENCOUNTER — Other Ambulatory Visit: Payer: Self-pay

## 2019-07-07 ENCOUNTER — Ambulatory Visit: Payer: PPO | Admitting: Pulmonary Disease

## 2019-07-07 DIAGNOSIS — J849 Interstitial pulmonary disease, unspecified: Secondary | ICD-10-CM | POA: Diagnosis not present

## 2019-07-07 MED ORDER — TRELEGY ELLIPTA 100-62.5-25 MCG/INH IN AEPB: 1.0000 | INHALATION_SPRAY | Freq: Every day | RESPIRATORY_TRACT | 3 refills | Status: DC

## 2019-07-07 NOTE — Progress Notes (Signed)
Bradley Ferguson    [id number]    12-Aug-1950  Primary Care Physician:McKeown, Gwyndolyn Saxon, MD  Referring Physician: Liane Comber, NP [address] [place],  [place] [address]  Chief complaint: Consult for interstitial lung disease, emphysema  HPI: 69 year old smoker smoker with history of hyperlipidemia, emphysema, atrial fibrillation, peripheral artery disease.  Referred for evaluation of emphysema and abnormal CT showing pulmonary fibrosis  Chief complaint is dyspnea on exertion which is slowly worsening over the past 3 years.  This was initially attributed to severe peripheral vascular disease.  Also has cough, minimal sputum production.  Denies any fevers, chills.  Has back pain attributed to arthritis.  Also reports Raynaud's phenomena, occasional difficulty swallowing.  No dry mouth or dry eyes  Recently given samples of Trelegy inhaler by his primary care.  Pets: Has 2 dogs.  No cats, birds, farm animals Occupation: Used to work in [employer], as a Research officer, trade union and odd jobs. Exposures: May have been exposed to asbestos.  Reports that his previous has had mold in the basement.  He moved out of it 2 years ago.  He also had feather pillows and comforters but got rid of them 2 years ago Smoking history: 45-pack-year smoker.  Continues to smoke up to 1 pack/day Travel history: No significant travel history Relevant family history: No significant family history of lung disease.  Outpatient Encounter Medications as of 07/07/2019  Medication Sig  . ALPRAZolam (XANAX) 1 MG tablet Take 1/2-1 tablet 2 - 3 x /day ONLY if needed for Anxiety Attack &  limit to 5 days /week to avoid addiction  . aspirin EC 81 MG tablet Take 81 mg by mouth daily.  Marland Kitchen atorvastatin (LIPITOR) 80 MG tablet Take 1 tablet Daily for Cholesterol  . CHOLECALCIFEROL PO Take 10,000 Units by mouth daily.  Marland Kitchen dofetilide (TIKOSYN) 500 MCG capsule Take 1 capsule (500 mcg total) by mouth 2 (two) times  daily.  . Fluticasone-Umeclidin-Vilant (TRELEGY ELLIPTA) 100-62.5-25 MCG/INH AEPB Inhale 1 puff into the lungs daily.  Marland Kitchen ketoconazole (NIZORAL) 2 % cream Apply to rash 1 to 2 x /day  . rivaroxaban (XARELTO) 20 MG TABS tablet Take 1 tablet Daily to Prevent Blood Clots  . triamcinolone cream (KENALOG) 0.5 % Apply 1 application topically 2 (two) times daily.   No facility-administered encounter medications on file as of 07/07/2019.    Allergies as of 07/07/2019 - Review Complete 07/07/2019  Allergen Reaction Noted  . Effexor [venlafaxine]  08/13/2015  . Wellbutrin [bupropion]  08/13/2015  . Zoloft [sertraline hcl]  08/13/2015    Past Medical History:  Diagnosis Date  . Anxiety   . Atrial fibrillation ([place])   . Blood transfusion without reported diagnosis   . Cataract   . Depression   . Emphysema of lung ([place])   . Heart murmur   . Hyperlipidemia   . PVD (peripheral vascular disease) ([place])   . Raynaud's syndrome     Past Surgical History:  Procedure Laterality Date  . ATRIAL FIBRILLATION ABLATION    . CATARACT EXTRACTION Right   . FRACTURE SURGERY    . LOWER EXTREMITY INTERVENTION N/A 04/27/2017   Procedure: LOWER EXTREMITY INTERVENTION;  Surgeon: Lorretta Harp, MD;  Location: [facility];  Service: Cardiovascular;  Laterality: N/A;  . PERIPHERAL VASCULAR ATHERECTOMY Right 04/27/2017   Procedure: PERIPHERAL VASCULAR ATHERECTOMY;  Surgeon: Lorretta Harp, MD;  Location: [facility];  Service: Cardiovascular;  Laterality: Right;  Iliac  . PERIPHERAL VASCULAR INTERVENTION Right 04/27/2017   Procedure: PERIPHERAL VASCULAR INTERVENTION;  Surgeon: Lorretta Harp, MD;  Location: [facility];  Service: Cardiovascular;  Laterality: Right;  Iliac    Family History  Problem Relation Age of Onset  . Cancer Mother        small cell lung cancer  . Cancer Father        unsure type  . Colon cancer Neg Hx   . Rectal cancer Neg Hx     Social History    Socioeconomic History  . Marital status: Legally Separated    Spouse name: Not on file  . Number of children: 1  . Years of education: Not on file  . Highest education level: Not on file  Occupational History  . 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 Smoking status: Current Every Day Smoker    Packs/day: 0.25    Years: 49.00    Pack years: 12.25    Types: Cigarettes    Start date: 60  . Smokeless tobacco: Never Used  Substance and Sexual Activity  . Alcohol use: Yes    Alcohol/week: 4.0 standard drinks    Types: 4 Standard drinks or equivalent per week    Comment: 3-4 drinks per week  . Drug use: Never  . Sexual activity: Not on file  Other Topics Concern  . Not on file  Social History Narrative  . Not on file   Social Determinants of Health   Financial Resource Strain:   . Difficulty of Paying Living Expenses: Not on file  Food Insecurity:   . Worried About Charity fundraiser in the Last Year: Not on file  . Ran Out of Food in the Last Year: Not on file  Transportation Needs:   . Lack of Transportation (Medical): Not on file  . Lack of Transportation (Non-Medical): Not on file  Physical Activity:   . Days of Exercise per Week: Not on file  . Minutes of Exercise per Session: Not on file  Stress:   . Feeling of Stress : Not on file  Social Connections:   . Frequency of Communication with Friends and Family: Not on file  . Frequency of Social Gatherings with Friends and Family: Not on file  . Attends Religious Services: Not on file  . Active Member of Clubs or Organizations: Not on file  . Attends Archivist Meetings: Not on file  . Marital Status: Not on file  Intimate Partner Violence:   . Fear of Current or Ex-Partner: Not on file  . Emotionally Abused: Not on file  . Physically Abused: Not on file  . Sexually Abused: Not on file    Review of systems: Review of Systems  Constitutional: Negative for fever and chills.  HENT:  Negative.   Eyes: Negative for blurred vision.  Respiratory: as per HPI  Cardiovascular: Negative for chest pain and palpitations.  Gastrointestinal: Negative for vomiting, diarrhea, blood per rectum. Genitourinary: Negative for dysuria, urgency, frequency and hematuria.  Musculoskeletal: Negative for myalgias, back pain and joint pain.  Skin: Negative for itching and rash.  Neurological: Negative for dizziness, tremors, focal weakness, seizures and loss of consciousness.  Endo/Heme/Allergies: Negative for environmental allergies.  Psychiatric/Behavioral: Negative for depression, suicidal ideas and hallucinations.  All other systems reviewed and are negative.  Physical Exam: Blood pressure 124/70, pulse 88, temperature 98.2 F (36.8 C), temperature source Temporal, height 6' 1.5" (1.867 m), weight 193 lb 9.6  oz (87.8 kg), SpO2 98 %. Gen:      No acute distress HEENT:  EOMI, sclera anicteric Neck:     No masses; no thyromegaly Lungs:    Clear to auscultation bilaterally; normal respiratory effort CV:         Regular rate and rhythm; no murmurs Abd:      + bowel sounds; soft, non-tender; no palpable masses, no distension Ext:    No edema; adequate peripheral perfusion Skin:      Warm and dry; no rash Neuro: alert and oriented x 3 Psych: normal mood and affect  Data Reviewed: Imaging: Low-dose screening CT 01/06/19-small pulmonary nodules measuring up to 4 mm.  Peripheral groundglass attenuation with reticulation, traction bronchiectasis with basal gradient.  No honeycombing.  Labs: CBC 06/20/2019-WBC 7.1, eos 6.9%, absolute eosinophil count 490  Assessment:  Interstitial lung disease, pulmonary fibrosis This is noted on low-dose screening CT of the chest He does report exposure to mold, down comforters and possible asbestos in the past.  He will also need evaluation for connective tissue disease with symptoms of Raynaud's and difficulty swallowing  Get high-res CT for better  characterization and PFTs Sent CTD serologies, mold antibody panel  Active smoker Smoking cessation discussed with patient.  He is not ready to quit yet.  Will reassess at return visit.  Time spent counseling- 5 mins Continue low-dose screening CT of the chest Continue Trelegy inhaler.  He will need inhaled corticosteroids as he has elevated peripheral eosinophils  Plan/Recommendations: - CTD serologies - CT, PFTs - Trelegy inhaler - Smoking cessation  This appointment required 40 minutes of patient care (this includes precharting, chart review, review of results, face-to-face care, etc.).  Marshell Garfinkel MD [facility] 07/07/2019, 11:54 AM  CC: Liane Comber, NP

## 2019-07-07 NOTE — Patient Instructions (Signed)
We will get lab test for interstitial lung disease and mold antibody panel We will schedule high-resolution CT, pulmonary function test We will send in a prescription for Trelegy inhaler Follow-up in 4 weeks

## 2019-07-11 LAB — ALLERGEN PROFILE, MOLD
Allergen, A. alternata, m6: <0.1 kU/L
Allergen, Mucor Racemosus, M4: <0.1 kU/L
Allergen, P. notatum, m1: <0.1 kU/L
Allergen, S. Botryosum, m10: <0.1 kU/L
Aspergillus fumigatus, m3: <0.1 kU/L
CLADOSPORIUM HERBARUM (M2) IGE: <0.1 kU/L
CLASS: 0
Class: 0
Class: 0
Class: 0
Class: 0
Class: 0

## 2019-07-11 LAB — HYPERSENSITIVITY PNEUMONITIS
A. Pullulans Abs: NEGATIVE
A.Fumigatus #1 Abs: NEGATIVE
Micropolyspora faeni, IgG: NEGATIVE
Pigeon Serum Abs: NEGATIVE
Thermoact. Saccharii: NEGATIVE
Thermoactinomyces vulgaris, IgG: NEGATIVE

## 2019-07-11 LAB — ANA,IFA RA DIAG PNL W/RFLX TIT/PATN
Anti Nuclear Antibody (ANA): POSITIVE — AB
Cyclic Citrullin Peptide Ab: <16 UNITS
Rheumatoid fact SerPl-aCnc: <14 IU/mL (ref <14)

## 2019-07-11 LAB — ANCA SCREEN W REFLEX TITER: ANCA Screen: NEGATIVE

## 2019-07-11 LAB — ANTI-NUCLEAR AB-TITER (ANA TITER)
ANA TITER: 1:1280 {titer} — ABNORMAL HIGH
ANA Titer 1: 1:320 {titer} — ABNORMAL HIGH

## 2019-07-11 LAB — SJOGREN'S SYNDROME ANTIBODS(SSA + SSB)
SSA (Ro) (ENA) Antibody, IgG: <1 AI
SSB (La) (ENA) Antibody, IgG: <1 AI

## 2019-07-11 LAB — ANTI-SCLERODERMA ANTIBODY: Scleroderma (Scl-70) (ENA) Antibody, IgG: <1 AI

## 2019-07-11 LAB — INTERPRETATION:

## 2019-07-18 ENCOUNTER — Ambulatory Visit: Payer: PPO | Attending: Internal Medicine

## 2019-07-18 DIAGNOSIS — Z20822 Contact with and (suspected) exposure to covid-19: Secondary | ICD-10-CM

## 2019-07-18 LAB — MYOMARKER 3 PLUS PROFILE (RDL)
Anti-EJ Ab (RDL): NEGATIVE
Anti-Jo-1 Ab (RDL): <20 Units (ref <20)
Anti-Ku Ab (RDL): NEGATIVE
Anti-MDA-5 Ab (CADM-140)(RDL): <20 Units (ref <20)
Anti-Mi-2 Ab (RDL): NEGATIVE
Anti-NXP-2 (P140) Ab (RDL): <20 Units (ref <20)
Anti-OJ Ab (RDL): NEGATIVE
Anti-PL-12 Ab (RDL: NEGATIVE
Anti-PL-7 Ab (RDL): NEGATIVE
Anti-PM/Scl-100 Ab (RDL): <20 Units (ref <20)
Anti-SAE1 Ab, IgG (RDL): 62 Units — ABNORMAL HIGH (ref <20)
Anti-SRP Ab (RDL): NEGATIVE
Anti-SS-A 52kD Ab, IgG (RDL): <20 Units (ref <20)
Anti-TIF-1gamma Ab (RDL): <20 Units (ref <20)
Anti-U1 RNP Ab (RDL): <20 Units (ref <20)
Anti-U2 RNP Ab (RDL): NEGATIVE
Anti-U3 RNP (Fibrillarin)(RDL): NEGATIVE

## 2019-07-19 LAB — NOVEL CORONAVIRUS, NAA: SARS-CoV-2, NAA: NOT DETECTED

## 2019-07-20 ENCOUNTER — Other Ambulatory Visit: Payer: PPO

## 2019-07-21 ENCOUNTER — Telehealth: Payer: Self-pay

## 2019-07-21 DIAGNOSIS — R768 Other specified abnormal immunological findings in serum: Secondary | ICD-10-CM

## 2019-07-21 NOTE — Telephone Encounter (Signed)
-----   Message from Marshell Garfinkel, MD sent at 07/21/2019  1:04 PM EST ----- Please let patient know labs are abnormal indicating he may have an autoimmune process.  Refer to rheumatology for further evaluation. We will discuss in detail on clinic visit after CT scan on 1/28.  Please ensure that he has a clinic visit scheduled.

## 2019-07-26 ENCOUNTER — Ambulatory Visit: Payer: PPO

## 2019-07-28 ENCOUNTER — Ambulatory Visit
Admission: RE | Admit: 2019-07-28 | Discharge: 2019-07-28 | Disposition: A | Discharge status: Home / Self Care | Payer: PPO | Source: Ambulatory Visit | Attending: Pulmonary Disease | Admitting: Pulmonary Disease

## 2019-07-28 ENCOUNTER — Other Ambulatory Visit: Payer: Self-pay

## 2019-07-28 DIAGNOSIS — J439 Emphysema, unspecified: Secondary | ICD-10-CM | POA: Diagnosis not present

## 2019-07-28 DIAGNOSIS — R918 Other nonspecific abnormal finding of lung field: Secondary | ICD-10-CM | POA: Diagnosis not present

## 2019-07-28 DIAGNOSIS — J849 Interstitial pulmonary disease, unspecified: Secondary | ICD-10-CM | POA: Diagnosis not present

## 2019-08-01 ENCOUNTER — Encounter: Payer: Self-pay | Admitting: Pulmonary Disease

## 2019-08-01 ENCOUNTER — Other Ambulatory Visit: Payer: Self-pay

## 2019-08-01 ENCOUNTER — Ambulatory Visit (INDEPENDENT_AMBULATORY_CARE_PROVIDER_SITE_OTHER): Payer: PPO | Admitting: Pulmonary Disease

## 2019-08-01 VITALS — BP 116/68 | HR 95 | Temp 97.4°F | Ht 73.5 in | Wt 194.6 lb

## 2019-08-01 DIAGNOSIS — J849 Interstitial pulmonary disease, unspecified: Secondary | ICD-10-CM | POA: Diagnosis not present

## 2019-08-01 NOTE — Patient Instructions (Signed)
I have reviewed your CT scan which shows some inflammation and scarring in the lung which may be from an autoimmune process Your labs show elevation in a marker called ANA.  We have made a referral to rheumatology to evaluate this further I will see you back in clinic in 1 month after your rheumatology assessment and we can discuss next steps. Continue to work on smoking cessation

## 2019-08-01 NOTE — Progress Notes (Signed)
Bradley Ferguson    [id number]    [birth year]  Primary Care Physician:McKeown, Gwyndolyn Saxon, MD  Referring Physician: [name], MD [address] [place],  [place] [address]  Chief complaint: Follow-up for interstitial lung disease, emphysema  HPI: 69 year old smoker smoker with history of hyperlipidemia, emphysema, atrial fibrillation, peripheral artery disease.  Referred for evaluation of emphysema and abnormal CT showing pulmonary fibrosis  Chief complaint is dyspnea on exertion which is slowly worsening over the past 3 years.  This was initially attributed to severe peripheral vascular disease.  Also has cough, minimal sputum production.  Denies any fevers, chills.  Has back pain attributed to arthritis.  Also reports Raynaud's phenomena, occasional difficulty swallowing.  No dry mouth or dry eyes  Recently given samples of Trelegy inhaler by his primary care.  Pets: Has 2 dogs.  No cats, birds, farm animals Occupation: Used to work in [employer], as a Research officer, trade union and odd jobs. Exposures: May have been exposed to asbestos.  Reports that his previous has had mold in the basement.  He moved out of it 2 years ago.  He also had feather pillows and comforters but got rid of them 2 years ago Smoking history: 45-pack-year smoker.  Continues to smoke up to 1 pack/day Travel history: No significant travel history Relevant family history: No significant family history of lung disease.  Interim history: Continues on Trelegy inhaler.  States that this helps with his breathing Continues to have chronic cough with white mucus, dyspnea on exertion which is unchanged from baseline Here for review of recent CT chest and labs  Outpatient Encounter Medications as of 08/01/2019  Medication Sig  . ALPRAZolam (XANAX) 1 MG tablet Take 1/2-1 tablet 2 - 3 x /day ONLY if needed for Anxiety Attack &  limit to 5 days /week to avoid addiction  . aspirin EC 81 MG tablet Take 81  mg by mouth daily.  Marland Kitchen atorvastatin (LIPITOR) 80 MG tablet Take 1 tablet Daily for Cholesterol  . CHOLECALCIFEROL PO Take 10,000 Units by mouth daily.  Marland Kitchen dofetilide (TIKOSYN) 500 MCG capsule Take 1 capsule (500 mcg total) by mouth 2 (two) times daily.  . Fluticasone-Umeclidin-Vilant (TRELEGY ELLIPTA) 100-62.5-25 MCG/INH AEPB Inhale 1 puff into the lungs daily.  Marland Kitchen ketoconazole (NIZORAL) 2 % cream Apply to rash 1 to 2 x /day  . rivaroxaban (XARELTO) 20 MG TABS tablet Take 1 tablet Daily to Prevent Blood Clots  . triamcinolone cream (KENALOG) 0.5 % Apply 1 application topically 2 (two) times daily.   No facility-administered encounter medications on file as of 08/01/2019.   Physical Exam: Blood pressure 116/68, pulse 95, temperature (!) 97.4 F (36.3 C), temperature source Temporal, height 6' 1.5" (1.867 m), weight 194 lb 9.6 oz (88.3 kg), SpO2 98 %. Gen:      No acute distress Lungs:    Scattered bibasilar crackles Ext:    No edema; adequate peripheral perfusion Skin:      Warm and dry; no rash Neuro: alert and oriented x 3 Psych: normal mood and affect  Data Reviewed: Imaging: Low-dose screening CT 01/06/19-small pulmonary nodules measuring up to 4 mm.  Peripheral groundglass attenuation with reticulation, traction bronchiectasis with basal gradient.  No honeycombing.  CT high-resolution 07/28/2019-basilar predominant fibrotic interstitial lung disease with no honeycombing.  There is subtle subpleural sparing.  Probable UIP versus NSIP. I have reviewed the images personally  Labs: CBC 06/20/2019-WBC 7.1, eos 6.9%, absolute eosinophil count 490  CTD serologies 07/07/2019-positive ANA 1:320 [nuclear, speckled], 1:1280 [nuclear, nucleolar]  Assessment:  Interstitial lung disease, pulmonary fibrosis High-res CT reviewed with probable UIP versus NSIP pattern. He does report exposure to mold, down comforters and possible asbestos in the past. Probably has connective tissue disease with symptoms  of Raynaud's and difficulty swallowing and significant ANA elevation.  Has has been referred to Rheumatology and we will await their assessment. He may need a brochoscopy to assess. Return to clinic in 1 month to reassess   Active smoker Smoking cessation discussed with patient.  He is not ready to quit yet.  Will reassess at return visit.  Time spent counseling- 5 mins Continue low-dose screening CT of the chest Continue Trelegy inhaler.  He will need inhaled corticosteroids as he has elevated peripheral eosinophils  Plan/Recommendations: - Rheumatology referral -Continue Trelegy.  Awaiting PFTs - Smoking cessation  This appointment required 40 minutes of patient care (this includes precharting, chart review, review of results, face-to-face care, etc.).  Marshell Garfinkel MD [facility] 08/01/2019, 10:48 AM  CC: [name], MD

## 2019-08-04 ENCOUNTER — Ambulatory Visit: Payer: PPO | Attending: Internal Medicine

## 2019-08-04 DIAGNOSIS — Z23 Encounter for immunization: Secondary | ICD-10-CM | POA: Insufficient documentation

## 2019-08-04 NOTE — Progress Notes (Signed)
   Covid-19 Vaccination Clinic  Name:  Bradley Ferguson    MRN: [id number] DOB: 05/15/1951  08/04/2019  [name] was observed post Covid-19 immunization for 30 minutes based on pre-vaccination screening without incidence. He was provided with Vaccine Information Sheet and instruction to access the V-Safe system.   [name] was instructed to call 911 with any severe reactions post vaccine: Marland Kitchen Difficulty breathing  . Swelling of your face and throat  . A fast heartbeat  . A bad rash all over your body  . Dizziness and weakness    Immunizations Administered    Name Date Dose VIS Date Route   Pfizer COVID-19 Vaccine 08/04/2019 11:25 AM 0.3 mL 06/10/2019 Intramuscular   Manufacturer: [place]   Lot: [id number]   [place]: [id number]

## 2019-08-23 DIAGNOSIS — H25812 Combined forms of age-related cataract, left eye: Secondary | ICD-10-CM | POA: Diagnosis not present

## 2019-08-23 DIAGNOSIS — H35371 Puckering of macula, right eye: Secondary | ICD-10-CM | POA: Diagnosis not present

## 2019-08-23 DIAGNOSIS — Z961 Presence of intraocular lens: Secondary | ICD-10-CM | POA: Diagnosis not present

## 2019-08-23 DIAGNOSIS — H26491 Other secondary cataract, right eye: Secondary | ICD-10-CM | POA: Diagnosis not present

## 2019-08-26 ENCOUNTER — Other Ambulatory Visit (HOSPITAL_COMMUNITY)
Admission: RE | Admit: 2019-08-26 | Discharge: 2019-08-26 | Disposition: A | Discharge status: Home / Self Care | Payer: PPO | Source: Ambulatory Visit | Attending: Pulmonary Disease | Admitting: Pulmonary Disease

## 2019-08-26 DIAGNOSIS — Z20822 Contact with and (suspected) exposure to covid-19: Secondary | ICD-10-CM | POA: Diagnosis not present

## 2019-08-26 DIAGNOSIS — Z01812 Encounter for preprocedural laboratory examination: Secondary | ICD-10-CM | POA: Diagnosis not present

## 2019-08-26 LAB — SARS CORONAVIRUS 2 (TAT 6-24 HRS): SARS Coronavirus 2: NEGATIVE

## 2019-08-29 ENCOUNTER — Ambulatory Visit (INDEPENDENT_AMBULATORY_CARE_PROVIDER_SITE_OTHER): Payer: PPO | Admitting: Pulmonary Disease

## 2019-08-29 ENCOUNTER — Ambulatory Visit: Payer: PPO | Attending: Internal Medicine

## 2019-08-29 ENCOUNTER — Other Ambulatory Visit: Payer: Self-pay

## 2019-08-29 ENCOUNTER — Encounter: Payer: PPO | Admitting: Primary Care

## 2019-08-29 DIAGNOSIS — J849 Interstitial pulmonary disease, unspecified: Secondary | ICD-10-CM

## 2019-08-29 DIAGNOSIS — Z23 Encounter for immunization: Secondary | ICD-10-CM | POA: Insufficient documentation

## 2019-08-29 LAB — PULMONARY FUNCTION TEST
DL/VA % pred: 86 %
DL/VA: 3.5 ml/min/mmHg/L
DLCO cor % pred: 73 %
DLCO cor: 21.19 ml/min/mmHg
DLCO unc % pred: 70 %
DLCO unc: 20.37 ml/min/mmHg
FEF 25-75 Post: 2.54 L/sec
FEF 25-75 Pre: 2.27 L/sec
FEF2575-%Change-Post: 11 %
FEF2575-%Pred-Post: 88 %
FEF2575-%Pred-Pre: 79 %
FEV1-%Change-Post: 3 %
FEV1-%Pred-Post: 88 %
FEV1-%Pred-Pre: 85 %
FEV1-Post: 3.28 L
FEV1-Pre: 3.17 L
FEV1FVC-%Change-Post: 4 %
FEV1FVC-%Pred-Pre: 96 %
FEV6-%Change-Post: 0 %
FEV6-%Pred-Post: 91 %
FEV6-%Pred-Pre: 92 %
FEV6-Post: 4.36 L
FEV6-Pre: 4.39 L
FEV6FVC-%Change-Post: 0 %
FEV6FVC-%Pred-Post: 104 %
FEV6FVC-%Pred-Pre: 104 %
FVC-%Change-Post: -1 %
FVC-%Pred-Post: 87 %
FVC-%Pred-Pre: 88 %
FVC-Post: 4.4 L
FVC-Pre: 4.44 L
Post FEV1/FVC ratio: 75 %
Post FEV6/FVC ratio: 99 %
Pre FEV1/FVC ratio: 71 %
Pre FEV6/FVC Ratio: 99 %
RV % pred: 64 %
RV: 1.65 L
TLC % pred: 78 %
TLC: 5.96 L

## 2019-08-29 NOTE — Progress Notes (Signed)
   Covid-19 Vaccination Clinic  Name:  Bradley Ferguson    MRN: [id number] DOB: 01-16-1951  08/29/2019  Bradley Ferguson was observed post Covid-19 immunization for 15 minutes without incidence. He was provided with Vaccine Information Sheet and instruction to access the V-Safe system.   Bradley Ferguson was instructed to call 911 with any severe reactions post vaccine: Marland Kitchen Difficulty breathing  . Swelling of your face and throat  . A fast heartbeat  . A bad rash all over your body  . Dizziness and weakness    Immunizations Administered    Name Date Dose VIS Date Route   Pfizer COVID-19 Vaccine 08/29/2019  2:43 PM 0.3 mL 06/10/2019 Intramuscular   Manufacturer: [place]   Lot: [id number]   [place]: [id number]

## 2019-08-29 NOTE — Progress Notes (Signed)
@Patient  ID: Bradley Ferguson, male    DOB: [birth year], 69 y.o.   MRN: [id number]  No chief complaint on file.   Referring provider: [name], MD  HPI: 69 year old male, current every day smoker. PMH significant for COPD, afib, peripheral vascular disease, HLD,ADD, bipolar disorder. Patient of Dr. Vaughan Browner, seen for initial consult on 07/07/19 for ILD and emphysema.   Previous [facility] pulmonary encounters: 07/07/19- Consult, Dr. Vaughan Browner 69 year old smoker smoker with history of hyperlipidemia, emphysema, atrial fibrillation, peripheral artery disease.  Referred for evaluation of emphysema and abnormal CT showing pulmonary fibrosis Chief complaint is dyspnea on exertion which is slowly worsening over the past 3 years.  This was initially attributed to severe peripheral vascular disease.  Also has cough, minimal sputum production.  Denies any fevers, chills.  Has back pain attributed to arthritis.  Also reports Raynaud's phenomena, occasional difficulty swallowing.  No dry mouth or dry eyes Recently given samples of Trelegy inhaler by his primary care.  Pets: Has 2 dogs.  No cats, birds, farm animals Occupation: Used to work in [employer], as a Research officer, trade union and odd jobs. Exposures: May have been exposed to asbestos.  Reports that his previous has had mold in the basement.  He moved out of it 2 years ago.  He also had feather pillows and comforters but got rid of them 2 years ago Smoking history: 45-pack-year smoker.  Continues to smoke up to 1 pack/day Travel history: No significant travel history Relevant family history: No significant family history of lung disease.  Interim history: Continues on Trelegy inhaler.  States that this helps with his breathing Continues to have chronic cough with white mucus, dyspnea on exertion which is unchanged from baseline Here for review of recent CT chest and labs  08/29/2019 Patient presents today for 1 month follow-up with PFTs. HRCT showed probably  UIP vs NSIP pattern. Patient does have reported exposure to mold, down comforters and possible asbestosis. Felt he has some connective tissue disease with positive ANA and symptoms of raynaud's. Referred to rheumatology. May need bronchoscopy to assess. Patient has apt with rheumatology on 3/18 and follow-up with Dr. Vaughan Browner on 3/26.        Data Reviewed: Imaging: Low-dose screening CT 01/06/19-small pulmonary nodules measuring up to 4 mm.  Peripheral groundglass attenuation with reticulation, traction bronchiectasis with basal gradient.  No honeycombing.  CT high-resolution 07/28/2019-basilar predominant fibrotic interstitial lung disease with no honeycombing.  There is subtle subpleural sparing.  Probable UIP versus NSIP. I have reviewed the images personally  Labs: CBC 06/20/2019-WBC 7.1, eos 6.9%, absolute eosinophil count 490 CTD serologies 07/07/2019-positive ANA 1:320 [nuclear, speckled], 1:1280 [nuclear, nucleolar]  Allergies  Allergen Reactions  . Effexor [Venlafaxine]     Feels bad  . Wellbutrin [Bupropion]     sleepy  . Zoloft [Sertraline Hcl]     Felt bad    Immunization History  Administered Date(s) Administered  . Influenza Whole 04/30/2010  . Influenza, High Dose Seasonal PF 03/20/2017, 03/29/2018, 04/06/2019  . PFIZER SARS-COV-2 Vaccination 08/04/2019  . Pneumococcal Conjugate-13 02/05/2017  . Pneumococcal Polysaccharide-23 04/30/2010, 03/29/2018  . Td 06/30/2013    Past Medical History:  Diagnosis Date  . Anxiety   . Atrial fibrillation ([place])   . Blood transfusion without reported diagnosis   . Cataract   . Depression   . Emphysema of lung ([place])   . Heart murmur   . Hyperlipidemia   . PVD (peripheral vascular disease) ([place])   . Raynaud's  syndrome     Tobacco History: Social History   Tobacco Use  Smoking Status Current Every Day Smoker  . Packs/day: 0.25  . Years: 49.00  . Pack years: 12.25  . Types: Cigarettes  . Start date: 40  Smokeless  Tobacco Never Used   Ready to quit: Not Answered Counseling given: Not Answered   Outpatient Medications Prior to Visit  Medication Sig Dispense Refill  . ALPRAZolam (XANAX) 1 MG tablet Take 1/2-1 tablet 2 - 3 x /day ONLY if needed for Anxiety Attack &  limit to 5 days /week to avoid addiction 90 tablet 0  . aspirin EC 81 MG tablet Take 81 mg by mouth daily.    Marland Kitchen atorvastatin (LIPITOR) 80 MG tablet Take 1 tablet Daily for Cholesterol 90 tablet 1  . CHOLECALCIFEROL PO Take 10,000 Units by mouth daily.    Marland Kitchen dofetilide (TIKOSYN) 500 MCG capsule Take 1 capsule (500 mcg total) by mouth 2 (two) times daily. 180 capsule 1  . Fluticasone-Umeclidin-Vilant (TRELEGY ELLIPTA) 100-62.5-25 MCG/INH AEPB Inhale 1 puff into the lungs daily. 180 each 3  . ketoconazole (NIZORAL) 2 % cream Apply to rash 1 to 2 x /day 60 g 1  . rivaroxaban (XARELTO) 20 MG TABS tablet Take 1 tablet Daily to Prevent Blood Clots 90 tablet 1  . triamcinolone cream (KENALOG) 0.5 % Apply 1 application topically 2 (two) times daily. 80 g 1   No facility-administered medications prior to visit.      Review of Systems  Review of Systems   Physical Exam  There were no vitals taken for this visit. Physical Exam   Lab Results:  CBC    Component Value Date/Time   WBC 7.1 06/30/2019 1247   RBC 4.42 06/30/2019 1247   HGB 13.3 06/30/2019 1247   HGB 13.9 04/16/2017 1628   HCT 40.2 06/30/2019 1247   HCT 41.6 04/16/2017 1628   PLT 160 06/30/2019 1247   PLT 149 (L) 04/16/2017 1628   MCV 91.0 06/30/2019 1247   MCV 91 04/16/2017 1628   MCH 30.1 06/30/2019 1247   MCHC 33.1 06/30/2019 1247   RDW 12.9 06/30/2019 1247   RDW 14.9 04/16/2017 1628   LYMPHSABS 1,789 06/30/2019 1247   LYMPHSABS 2.1 04/16/2017 1628   MONOABS 630 02/05/2017 1234   EOSABS 490 06/30/2019 1247   EOSABS 0.3 04/16/2017 1628   BASOSABS 50 06/30/2019 1247   BASOSABS 0.1 04/16/2017 1628    BMET    Component Value Date/Time   NA 141 06/30/2019 1247    NA 138 05/09/2019 0851   K 4.5 06/30/2019 1247   CL 105 06/30/2019 1247   CO2 29 06/30/2019 1247   GLUCOSE 97 06/30/2019 1247   BUN 16 06/30/2019 1247   BUN 17 05/09/2019 0851   CREATININE 0.95 06/30/2019 1247   CALCIUM 9.5 06/30/2019 1247   GFRNONAA 82 06/30/2019 1247   GFRAA 96 06/30/2019 1247    BNP No results found for: BNP  ProBNP No results found for: PROBNP  Imaging: No results found.   Assessment & Plan:   No problem-specific Assessment & Plan notes found for this encounter.     Martyn Ehrich, NP 08/29/2019

## 2019-08-29 NOTE — Progress Notes (Signed)
PFT done today. 

## 2019-09-01 ENCOUNTER — Telehealth: Payer: Self-pay | Admitting: Pulmonary Disease

## 2019-09-01 NOTE — Telephone Encounter (Signed)
Spoke with patient, he states he called the rheumatology and they cannot get him in until May. I explained to him per Dr. Matilde Bash note due to the raynaud's disease and possible connective tissue disease, Dr. Vaughan Browner referred to Rheumatology.   Patient verbalized understanding , nothing further needed at this time.

## 2019-09-12 DIAGNOSIS — H2512 Age-related nuclear cataract, left eye: Secondary | ICD-10-CM | POA: Diagnosis not present

## 2019-09-15 ENCOUNTER — Ambulatory Visit: Payer: Self-pay | Admitting: Rheumatology

## 2019-09-15 ENCOUNTER — Ambulatory Visit: Payer: PPO | Admitting: Pulmonary Disease

## 2019-09-15 DIAGNOSIS — H2589 Other age-related cataract: Secondary | ICD-10-CM | POA: Diagnosis not present

## 2019-09-23 ENCOUNTER — Ambulatory Visit: Payer: PPO | Admitting: Pulmonary Disease

## 2019-09-28 ENCOUNTER — Encounter: Payer: Self-pay | Admitting: Internal Medicine

## 2019-09-28 NOTE — Patient Instructions (Signed)

## 2019-09-28 NOTE — Progress Notes (Signed)
History of Present Illness:      This very nice 69 y.o. [name] presents for 6 month follow up with HTN,  ASDCAD, ASPVD, HLD, Pre-Diabetes and Vitamin D Deficiency.       Patient is treated for HTN (1996)  & BP has been controlled at home. Today's BP is at goal - 112/62.  In 2011, patient had an Ablation for pAfib and in 2019 had recurrent  pAfib & was started on Tykosyn & Xarelto. Patient has ASPVD and in Patient has had no complaints of any cardiac type chest pain, palpitations, dyspnea / orthopnea / PND, dizziness, claudication, or dependent edema.      Hyperlipidemia is controlled with diet & meds. Patient denies myalgias or other med SE's. Last Lipids were at goal:  Lab Results  Component Value Date   CHOL 127 06/30/2019   HDL 40 06/30/2019   LDLCALC 64 06/30/2019   TRIG 151 (H) 06/30/2019   CHOLHDL 3.2 06/30/2019    Also, the patient has history of PreDiabetes (A1c 5.9% / 2017)  and has had no symptoms of reactive hypoglycemia, diabetic polys, paresthesias or visual blurring.  Last A1c was not at goal:  Lab Results  Component Value Date   HGBA1C 5.8 (H) 06/30/2019       Further, the patient also has history of Vitamin D Deficiency ("28" / 2017)  and supplements vitamin D without any suspected side-effects. Last vitamin D was near goal:  Lab Results  Component Value Date   VD25OH 55 03/28/2019    Current Outpatient Medications on File Prior to Visit  Medication Sig  . ALPRAZolam (XANAX) 1 MG tablet Take 1/2-1 tablet 2 - 3 x /day ONLY if needed for Anxiety Attack &  limit to 5 days /week to avoid addiction  . aspirin EC 81 MG tablet Take 81 mg by mouth daily.  Marland Kitchen atorvastatin (LIPITOR) 80 MG tablet Take 1 tablet Daily for Cholesterol  . CHOLECALCIFEROL PO Take 10,000 Units by mouth daily.  Marland Kitchen dofetilide (TIKOSYN) 500 MCG capsule Take 1 capsule (500 mcg total) by mouth 2 (two) times daily.  . Fluticasone-Umeclidin-Vilant (TRELEGY ELLIPTA) 100-62.5-25 MCG/INH AEPB Inhale 1  puff into the lungs daily.  Marland Kitchen ketoconazole (NIZORAL) 2 % cream Apply to rash 1 to 2 x /day  . rivaroxaban (XARELTO) 20 MG TABS tablet Take 1 tablet Daily to Prevent Blood Clots  . triamcinolone cream (KENALOG) 0.5 % Apply 1 application topically 2 (two) times daily.   No current facility-administered medications on file prior to visit.    Allergies  Allergen Reactions  . Effexor [Venlafaxine]     Feels bad  . Wellbutrin [Bupropion]     sleepy  . Zoloft [Sertraline Hcl]     Felt bad    PMHx:   Past Medical History:  Diagnosis Date  . Anxiety   . Atrial fibrillation ([place])   . Blood transfusion without reported diagnosis   . Cataract   . Depression   . Emphysema of lung ([place])   . Heart murmur   . Hyperlipidemia   . PVD (peripheral vascular disease) ([place])   . Raynaud's syndrome     Immunization History  Administered Date(s) Administered  . Influenza Whole 04/30/2010  . Influenza, High Dose Seasonal PF 03/20/2017, 03/29/2018, 04/06/2019  . PFIZER SARS-COV-2 Vaccination 08/04/2019, 08/29/2019  . Pneumococcal Conjugate-13 02/05/2017  . Pneumococcal Polysaccharide-23 04/30/2010, 03/29/2018  . Td 06/30/2013    Past Surgical History:  Procedure Laterality Date  .  ATRIAL FIBRILLATION ABLATION    . CATARACT EXTRACTION Right   . FRACTURE SURGERY    . LOWER EXTREMITY INTERVENTION N/A 04/27/2017   Procedure: LOWER EXTREMITY INTERVENTION;  Surgeon: Lorretta Harp, MD;  Location: [facility];  Service: Cardiovascular;  Laterality: N/A;  . PERIPHERAL VASCULAR ATHERECTOMY Right 04/27/2017   Procedure: PERIPHERAL VASCULAR ATHERECTOMY;  Surgeon: Lorretta Harp, MD;  Location: [facility];  Service: Cardiovascular;  Laterality: Right;  Iliac  . PERIPHERAL VASCULAR INTERVENTION Right 04/27/2017   Procedure: PERIPHERAL VASCULAR INTERVENTION;  Surgeon: Lorretta Harp, MD;  Location: [facility];  Service: Cardiovascular;  Laterality: Right;  Iliac    FHx:     Reviewed / unchanged  SHx:    Reviewed / unchanged   Systems Review:  Constitutional: Denies fever, chills, wt changes, headaches, insomnia, fatigue, night sweats, change in appetite. Eyes: Denies redness, blurred vision, diplopia, discharge, itchy, watery eyes.  ENT: Denies discharge, congestion, post nasal drip, epistaxis, sore throat, earache, hearing loss, dental pain, tinnitus, vertigo, sinus pain, snoring.  CV: Denies chest pain, palpitations, irregular heartbeat, syncope, dyspnea, diaphoresis, orthopnea, PND, claudication or edema. Respiratory: denies cough, dyspnea, DOE, pleurisy, hoarseness, laryngitis, wheezing.  Gastrointestinal: Denies dysphagia, odynophagia, heartburn, reflux, water brash, abdominal pain or cramps, nausea, vomiting, bloating, diarrhea, constipation, hematemesis, melena, hematochezia  or hemorrhoids. Genitourinary: Denies dysuria, frequency, urgency, nocturia, hesitancy, discharge, hematuria or flank pain. Musculoskeletal: Denies arthralgias, myalgias, stiffness, jt. swelling, pain, limping or strain/sprain.  Skin: Denies pruritus, rash, hives, warts, acne, eczema or change in skin lesion(s). Neuro: No weakness, tremor, incoordination, spasms, paresthesia or pain. Psychiatric: Denies confusion, memory loss or sensory loss. Endo: Denies change in weight, skin or hair change.  Heme/Lymph: No excessive bleeding, bruising or enlarged lymph nodes.  Physical Exam  BP 112/62   Pulse 68   Temp (!) 96.3 F (35.7 C)   Resp 16   Ht 6\' 1"  (1.854 m)   Wt 187 lb 9.6 oz (85.1 kg)   BMI 24.75 kg/m   Appears  well nourished, well groomed  and in no distress.  Eyes: PERRLA, EOMs, conjunctiva no swelling or erythema. Sinuses: No frontal/maxillary tenderness ENT/Mouth: EAC's clear, TM's nl w/o erythema, bulging. Nares clear w/o erythema, swelling, exudates. Oropharynx clear without erythema or exudates. Oral hygiene is good. Tongue normal, non obstructing. Hearing  intact.  Neck: Supple. Thyroid not palpable. Car 2+/2+ without bruits, nodes or JVD. Chest: Respirations nl with BS clear & equal w/o rales, rhonchi, wheezing or stridor.  Cor: Heart sounds normal w/ regular rate and rhythm without sig. murmurs, gallops, clicks or rubs. Peripheral pulses normal and equal  without edema.  Abdomen: Soft & bowel sounds normal. Non-tender w/o guarding, rebound, hernias, masses or organomegaly.  Lymphatics: Unremarkable.  Musculoskeletal: Full ROM all peripheral extremities, joint stability, 5/5 strength and normal gait.  Skin: Warm, dry without exposed rashes, lesions or ecchymosis apparent.  Neuro: Cranial nerves intact, reflexes equal bilaterally. Sensory-motor testing grossly intact. Tendon reflexes grossly intact.  Pysch: Alert & oriented x 3.  Insight and judgement nl & appropriate. No ideations.  Assessment and Plan:  1. Labile hypertension  - Continue medication, monitor blood pressure at home.  - Continue DASH diet.  Reminder to go to the ER if any CP,  SOB, nausea, dizziness, severe HA, changes vision/speech.  - CBC with Differential/Platelet - COMPLETE METABOLIC PANEL WITH GFR - Magnesium - TSH  2. Hyperlipidemia, mixed  - Continue diet/meds, exercise,& lifestyle modifications.  -  Continue monitor periodic cholesterol/liver & renal functions   - Lipid panel - TSH  3. Abnormal glucose  - Continue diet, exercise  - Lifestyle modifications.  - Monitor appropriate labs.  - Hemoglobin A1c - Insulin, random  4. Vitamin D deficiency  - Continue supplementation.  - VITAMIN D 25 Hydroxy  5. Paroxysmal atrial fibrillation (HCC)  - TSH  6. Acquired thrombophilia ([place])  - CBC with Differential/Platelet  7. Medication management  - CBC with Differential/Platelet - COMPLETE METABOLIC PANEL WITH GFR - Magnesium - Lipid panel - TSH - Hemoglobin A1c - Insulin, random - VITAMIN D 25 Hydroxy        Discussed  regular exercise, BP  monitoring, weight control to achieve/maintain BMI less than 25 and discussed med and SE's. Recommended labs to assess and monitor clinical status with further disposition pending results of labs.  I discussed the assessment and treatment plan with the patient. The patient was provided an opportunity to ask questions and all were answered. The patient agreed with the plan and demonstrated an understanding of the instructions.  I provided over 30 minutes of exam, counseling, chart review and  complex critical decision making.   Kirtland Bouchard, MD

## 2019-09-29 ENCOUNTER — Ambulatory Visit (INDEPENDENT_AMBULATORY_CARE_PROVIDER_SITE_OTHER): Payer: PPO | Admitting: Internal Medicine

## 2019-09-29 ENCOUNTER — Other Ambulatory Visit: Payer: Self-pay

## 2019-09-29 VITALS — BP 112/62 | HR 68 | Temp 96.3°F | Resp 16 | Ht 73.0 in | Wt 187.6 lb

## 2019-09-29 DIAGNOSIS — E782 Mixed hyperlipidemia: Secondary | ICD-10-CM | POA: Diagnosis not present

## 2019-09-29 DIAGNOSIS — R0989 Other specified symptoms and signs involving the circulatory and respiratory systems: Secondary | ICD-10-CM

## 2019-09-29 DIAGNOSIS — Z79899 Other long term (current) drug therapy: Secondary | ICD-10-CM | POA: Diagnosis not present

## 2019-09-29 DIAGNOSIS — D6869 Other thrombophilia: Secondary | ICD-10-CM

## 2019-09-29 DIAGNOSIS — I480 Paroxysmal atrial fibrillation: Secondary | ICD-10-CM | POA: Diagnosis not present

## 2019-09-29 DIAGNOSIS — R7309 Other abnormal glucose: Secondary | ICD-10-CM | POA: Diagnosis not present

## 2019-09-29 DIAGNOSIS — E559 Vitamin D deficiency, unspecified: Secondary | ICD-10-CM

## 2019-09-30 LAB — HEMOGLOBIN A1C
Hgb A1c MFr Bld: 5.7 % of total Hgb — ABNORMAL HIGH (ref <5.7)
Mean Plasma Glucose: 117 (calc)
eAG (mmol/L): 6.5 (calc)

## 2019-09-30 LAB — COMPLETE METABOLIC PANEL WITH GFR
AG Ratio: 1.7 (calc) (ref 1.0–2.5)
ALT: 16 U/L (ref 9–46)
AST: 26 U/L (ref 10–35)
Albumin: 4.6 g/dL (ref 3.6–5.1)
Alkaline phosphatase (APISO): 70 U/L (ref 35–144)
BUN: 16 mg/dL (ref 7–25)
CO2: 27 mmol/L (ref 20–32)
Calcium: 10 mg/dL (ref 8.6–10.3)
Chloride: 106 mmol/L (ref 98–110)
Creat: 0.97 mg/dL (ref 0.70–1.25)
GFR, Est African American: 93 mL/min/{1.73_m2} (ref >=60)
GFR, Est Non African American: 80 mL/min/{1.73_m2} (ref >=60)
Globulin: 2.7 g/dL (calc) (ref 1.9–3.7)
Glucose, Bld: 97 mg/dL (ref 65–99)
Potassium: 4.3 mmol/L (ref 3.5–5.3)
Sodium: 142 mmol/L (ref 135–146)
Total Bilirubin: 0.8 mg/dL (ref 0.2–1.2)
Total Protein: 7.3 g/dL (ref 6.1–8.1)

## 2019-09-30 LAB — LIPID PANEL
Cholesterol: 129 mg/dL (ref <200)
HDL: 43 mg/dL (ref >=40)
LDL Cholesterol (Calc): 63 mg/dL (calc)
Non-HDL Cholesterol (Calc): 86 mg/dL (calc) (ref <130)
Total CHOL/HDL Ratio: 3 (calc) (ref <5.0)
Triglycerides: 146 mg/dL (ref <150)

## 2019-09-30 LAB — CBC WITH DIFFERENTIAL/PLATELET
Absolute Monocytes: 755 cells/uL (ref 200–950)
Basophils Absolute: 91 cells/uL (ref 0–200)
Basophils Relative: 1 %
Eosinophils Absolute: 173 cells/uL (ref 15–500)
Eosinophils Relative: 1.9 %
HCT: 43.3 % (ref 38.5–50.0)
Hemoglobin: 14.4 g/dL (ref 13.2–17.1)
Lymphs Abs: 1947 cells/uL (ref 850–3900)
MCH: 30.4 pg (ref 27.0–33.0)
MCHC: 33.3 g/dL (ref 32.0–36.0)
MCV: 91.5 fL (ref 80.0–100.0)
MPV: 11.4 fL (ref 7.5–12.5)
Monocytes Relative: 8.3 %
Neutro Abs: 6133 cells/uL (ref 1500–7800)
Neutrophils Relative %: 67.4 %
Platelets: 161 10*3/uL (ref 140–400)
RBC: 4.73 10*6/uL (ref 4.20–5.80)
RDW: 13.4 % (ref 11.0–15.0)
Total Lymphocyte: 21.4 %
WBC: 9.1 10*3/uL (ref 3.8–10.8)

## 2019-09-30 LAB — MAGNESIUM: Magnesium: 2.1 mg/dL (ref 1.5–2.5)

## 2019-09-30 LAB — TSH: TSH: 1.21 mIU/L (ref 0.40–4.50)

## 2019-09-30 LAB — VITAMIN D 25 HYDROXY (VIT D DEFICIENCY, FRACTURES): Vit D, 25-Hydroxy: 52 ng/mL (ref 30–100)

## 2019-09-30 LAB — INSULIN, RANDOM: Insulin: 11.4 u[IU]/mL

## 2019-11-09 NOTE — Progress Notes (Signed)
Office Visit Note  Patient: Bradley Ferguson             Date of Birth: July 19, 1950           MRN: [id number]             PCP: [name], MD Referring: [name], MD Visit Date: 11/17/2019 Occupation: @GUAROCC @  Subjective:  Raynaud's, positive ANA  History of Present Illness: Bradley Ferguson is a 69 y.o. male seen in consultation per request of his PCP for evaluation of positive ANA and Raynaud's.  Patient states he has had Raynaud's for over 30 years.  He states it has never been very bothersome.  He realized that after he had ablation for atrial fibrillation and he was a started on Trelegy Ellipta his renal symptoms improved remarkably.  He still feels somewhat cold in his feet because of the peripheral vascular disease.  He denies any muscular weakness although he does get weak after prolonged walking due to claudications.  There is no history of oral ulcers, nasal ulcers, malar rash, photosensitivity, lymphadenopathy, joint swelling.  He denies any joint pain.  He states he had fractured his wrist in the past which causes discomfort sometimes.  He also had an injury to his left femur, he had compound fracture which bothers him occasionally. He has been experiencing shortness of breath for the last 3 years.  He also has dry cough.  He states he has been diagnosed with COPD.  Although when I reviewed the chart I found that he has seen Dr. Kimber Relic who has diagnosed him with ILD based on the high-resolution CT.  Activities of Daily Living:  Patient reports morning stiffness for 0 none.   Patient Denies nocturnal pain.  Difficulty dressing/grooming: Denies Difficulty climbing stairs: Reports Difficulty getting out of chair: Denies Difficulty using hands for taps, buttons, cutlery, and/or writing: Denies  Review of Systems  Constitutional: Positive for fatigue. Negative for night sweats.  HENT: Negative for mouth sores, mouth dryness and nose dryness.   Eyes: Negative for redness  and dryness.  Respiratory: Positive for shortness of breath. Negative for difficulty breathing.   Cardiovascular: Negative for chest pain, palpitations, hypertension, irregular heartbeat and swelling in legs/feet.  Gastrointestinal: Negative for constipation and diarrhea.  Endocrine: Positive for cold intolerance. Negative for increased urination.  Genitourinary: Negative for difficulty urinating.  Musculoskeletal: Positive for muscle weakness. Negative for arthralgias, joint pain, joint swelling, myalgias, morning stiffness, muscle tenderness and myalgias.  Skin: Positive for color change. Negative for rash, hair loss, nodules/bumps, skin tightness, ulcers and sensitivity to sunlight.  Allergic/Immunologic: Negative for susceptible to infections.  Neurological: Negative for dizziness, fainting, numbness, memory loss, night sweats and weakness ( ).  Hematological: Positive for bruising/bleeding tendency. Negative for swollen glands.  Psychiatric/Behavioral: Positive for depressed mood and sleep disturbance. The patient is nervous/anxious.     PMFS History:  Patient Active Problem List   Diagnosis Date Noted  . Atherosclerosis of aorta ([place]) 06/30/2019  . Chronic obstructive pulmonary disease ([place]) 06/30/2019  . Acquired thrombophilia ([place]) 05/10/2019  . Colon polyps 12/06/2018  . Visit for monitoring Tikosyn therapy 04/27/2018  . Claudication in peripheral vascular disease ([place]) 04/16/2017  . Labile hypertension 02/29/2016  . Other abnormal glucose (prediabetes) 02/29/2016  . BPH 02/29/2016  . Hyperlipidemia, mixed 11/12/2015  . Vitamin D deficiency 11/12/2015  . Bipolar I disorder ([place]) 08/13/2015  . Atrial fibrillation ([place]) 08/13/2015  . Attention deficit disorder 08/08/2010  . Dental caries 08/08/2010  .  ANXIETY DEPRESSION 06/06/2010  . TOBACCO ABUSE 06/06/2010  . RAYNAUDS SYNDROME 06/06/2010    Past Medical History:  Diagnosis Date  . Anxiety   . Atrial fibrillation ([place])    . Blood transfusion without reported diagnosis   . Cataract   . Depression   . Emphysema of lung ([place])   . Heart murmur   . Hyperlipidemia   . PVD (peripheral vascular disease) ([place])   . Raynaud's syndrome     Family History  Problem Relation Age of Onset  . Cancer Mother        small cell lung cancer  . Cancer Father        unsure type  . Colon cancer Neg Hx   . Rectal cancer Neg Hx    Past Surgical History:  Procedure Laterality Date  . ATRIAL FIBRILLATION ABLATION    . CATARACT EXTRACTION Right   . FRACTURE SURGERY    . LOWER EXTREMITY INTERVENTION N/A 04/27/2017   Procedure: LOWER EXTREMITY INTERVENTION;  Surgeon: Lorretta Harp, MD;  Location: [facility];  Service: Cardiovascular;  Laterality: N/A;  . PERIPHERAL VASCULAR ATHERECTOMY Right 04/27/2017   Procedure: PERIPHERAL VASCULAR ATHERECTOMY;  Surgeon: Lorretta Harp, MD;  Location: [facility];  Service: Cardiovascular;  Laterality: Right;  Iliac  . PERIPHERAL VASCULAR INTERVENTION Right 04/27/2017   Procedure: PERIPHERAL VASCULAR INTERVENTION;  Surgeon: Lorretta Harp, MD;  Location: [facility];  Service: Cardiovascular;  Laterality: Right;  Iliac   Social History   Social History Narrative  . Not on file   Immunization History  Administered Date(s) Administered  . Influenza Whole 04/30/2010  . Influenza, High Dose Seasonal PF 03/20/2017, 03/29/2018, 04/06/2019  . PFIZER SARS-COV-2 Vaccination 08/04/2019, 08/29/2019  . Pneumococcal Conjugate-13 02/05/2017  . Pneumococcal Polysaccharide-23 04/30/2010, 03/29/2018  . Td 06/30/2013     Objective: Vital Signs: BP 115/63 (BP Location: Right Arm, Patient Position: Sitting, Cuff Size: Normal)   Pulse 74   Resp 18   Ht 6' 1.5" (1.867 m)   Wt 189 lb (85.7 kg)   BMI 24.60 kg/m    Physical Exam Vitals and nursing note reviewed.  Constitutional:      Appearance: He is well-developed.  HENT:     Head: Normocephalic and  atraumatic.  Eyes:     Conjunctiva/sclera: Conjunctivae normal.     Pupils: Pupils are equal, round, and reactive to light.  Cardiovascular:     Rate and Rhythm: Normal rate and regular rhythm.     Heart sounds: Normal heart sounds.  Pulmonary:     Effort: Pulmonary effort is normal.     Breath sounds: Normal breath sounds.  Abdominal:     General: Bowel sounds are normal.     Palpations: Abdomen is soft.  Musculoskeletal:     Cervical back: Normal range of motion and neck supple.  Skin:    General: Skin is warm and dry.     Capillary Refill: Capillary refill takes less than 2 seconds.  Neurological:     Mental Status: He is alert and oriented to person, place, and time.  Psychiatric:        Behavior: Behavior normal.      Musculoskeletal Exam: C-spine thoracic and lumbar spine were in good range of motion.  Shoulder joints elbow joints were in good range of motion.  He has limited range of motion of his bilateral wrist joints with no synovitis.  DIP and PIP thickening was noted.  No sclerodactyly  was noted.  No nailbed capillary changes were noted.  Hip joints, knee joints, ankles with good range of motion.  He has severe DIP and PIP changes in his feet consistent with osteoarthritis.  He had no difficulty getting up from the ground from the squatting position.  CDAI Exam: CDAI Score: -- Patient Global: --; Provider Global: -- Swollen: --; Tender: -- Joint Exam 11/17/2019   No joint exam has been documented for this visit   There is currently no information documented on the homunculus. Go to the Rheumatology activity and complete the homunculus joint exam.  Investigation: No additional findings.  Imaging: No results found.  Recent Labs: Lab Results  Component Value Date   WBC 9.1 09/29/2019   HGB 14.4 09/29/2019   PLT 161 09/29/2019   NA 142 09/29/2019   K 4.3 09/29/2019   CL 106 09/29/2019   CO2 27 09/29/2019   GLUCOSE 97 09/29/2019   BUN 16 09/29/2019    CREATININE 0.97 09/29/2019   BILITOT 0.8 09/29/2019   ALKPHOS 73 08/10/2017   AST 26 09/29/2019   ALT 16 09/29/2019   PROT 7.3 09/29/2019   ALBUMIN 4.7 08/10/2017   CALCIUM 10.0 09/29/2019   GFRAA 93 09/29/2019    Speciality Comments: No specialty comments available.  Procedures:  No procedures performed Allergies: Effexor [venlafaxine], Wellbutrin [bupropion], and Zoloft [sertraline hcl]   Assessment / Plan:     Visit Diagnoses: Positive ANA (antinuclear antibody) - 07/07/19: ANA 1:320NS, 1:1280NN, Ro-, La-, Scl-70-, ANCA-, CCP<16, RF<14, myomarker3: Anti-SAE1 Ab 62, rest of panel- -he complains of Raynaud's for over 30 years.  He states his symptoms have been better since his been treated for atrial fibrillation.  He had pretty good winter last year.  He denies any history of digital ulcers.  There is no history of oral ulcers, nasal ulcers, malar rash, photosensitivity or inflammatory arthritis.  Plan: Anti-Smith antibody, RNP Antibody, Anti-DNA antibody, double-stranded, ANA, C3 and C4, Beta-2 glycoprotein antibodies, Cardiolipin antibodies, IgG, IgM, IgA, Lupus Anticoagulant Eval w/Reflex  Raynaud's syndrome without gangrene - currently not active.  Plan: Cryoglobulin  ILD-I reviewed patient's chart and note from Dr. Kimber Relic.  He has been diagnosed with ILD and pulmonary fibrosis.  The high-resolution CT showed possible UIP versus NSIP pattern.  The work-up is still pending.  Other fatigue -he complains of fatigue but had no muscular weakness on my examination.  He states he gets tired after prolonged walking due to claudications.  Plan: CK, Aldolase  Other medical problems are listed as follows:  Paroxysmal atrial fibrillation ([place])  Atherosclerosis of aorta (HCC)  History of hyperlipidemia  Claudication in peripheral vascular disease ([place])  Acquired thrombophilia ([place])  History of COPD  Hx of colonic polyps  Vitamin D deficiency  Anxiety and  depression  Orders: Orders Placed This Encounter  Procedures  . CK  . Cryoglobulin  . Anti-Smith antibody  . RNP Antibody  . Anti-DNA antibody, double-stranded  . ANA  . C3 and C4  . Beta-2 glycoprotein antibodies  . Cardiolipin antibodies, IgG, IgM, IgA  . Lupus Anticoagulant Eval w/Reflex  . Aldolase   No orders of the defined types were placed in this encounter.   Face-to-face time spent with patient was 45 minutes. Greater than 50% of time was spent in counseling and coordination of care.  Follow-Up Instructions: Return for Raynauds phenomenon.   Bo Merino, MD  Note - This record has been created using Editor, commissioning.  Chart creation errors have been sought, but  may not always  have been located. Such creation errors do not reflect on  the standard of medical care.  

## 2019-11-11 ENCOUNTER — Other Ambulatory Visit: Payer: Self-pay | Admitting: Physician Assistant

## 2019-11-17 ENCOUNTER — Encounter: Payer: Self-pay | Admitting: Rheumatology

## 2019-11-17 ENCOUNTER — Other Ambulatory Visit: Payer: Self-pay

## 2019-11-17 ENCOUNTER — Ambulatory Visit: Payer: PPO | Admitting: Rheumatology

## 2019-11-17 VITALS — BP 115/63 | HR 74 | Resp 18 | Ht 73.5 in | Wt 189.0 lb

## 2019-11-17 DIAGNOSIS — R768 Other specified abnormal immunological findings in serum: Secondary | ICD-10-CM

## 2019-11-17 DIAGNOSIS — F419 Anxiety disorder, unspecified: Secondary | ICD-10-CM

## 2019-11-17 DIAGNOSIS — D6869 Other thrombophilia: Secondary | ICD-10-CM | POA: Diagnosis not present

## 2019-11-17 DIAGNOSIS — I700 Atherosclerosis of aorta: Secondary | ICD-10-CM

## 2019-11-17 DIAGNOSIS — F329 Major depressive disorder, single episode, unspecified: Secondary | ICD-10-CM

## 2019-11-17 DIAGNOSIS — I7300 Raynaud's syndrome without gangrene: Secondary | ICD-10-CM | POA: Diagnosis not present

## 2019-11-17 DIAGNOSIS — Z8709 Personal history of other diseases of the respiratory system: Secondary | ICD-10-CM | POA: Diagnosis not present

## 2019-11-17 DIAGNOSIS — I739 Peripheral vascular disease, unspecified: Secondary | ICD-10-CM

## 2019-11-17 DIAGNOSIS — Z86010 Personal history of colonic polyps: Secondary | ICD-10-CM | POA: Diagnosis not present

## 2019-11-17 DIAGNOSIS — E559 Vitamin D deficiency, unspecified: Secondary | ICD-10-CM | POA: Diagnosis not present

## 2019-11-17 DIAGNOSIS — R5383 Other fatigue: Secondary | ICD-10-CM

## 2019-11-17 DIAGNOSIS — I480 Paroxysmal atrial fibrillation: Secondary | ICD-10-CM

## 2019-11-17 DIAGNOSIS — Z8639 Personal history of other endocrine, nutritional and metabolic disease: Secondary | ICD-10-CM | POA: Diagnosis not present

## 2019-11-17 DIAGNOSIS — J849 Interstitial pulmonary disease, unspecified: Secondary | ICD-10-CM

## 2019-11-22 LAB — ANTI-SMITH ANTIBODY: ENA SM Ab Ser-aCnc: <1 AI

## 2019-11-22 LAB — ALDOLASE: Aldolase: 5.1 U/L (ref <=8.1)

## 2019-11-22 LAB — LUPUS ANTICOAGULANT EVAL W/ REFLEX
PT, Mixing Interp: POSITIVE — AB
PTT-LA Screen: 45 s — ABNORMAL HIGH (ref <=40)
dRVVT: 68 s — ABNORMAL HIGH (ref <=45)

## 2019-11-22 LAB — RNP ANTIBODY: Ribonucleic Protein(ENA) Antibody, IgG: <1 AI

## 2019-11-22 LAB — ANTI-NUCLEAR AB-TITER (ANA TITER)
ANA TITER: 1:160 {titer} — ABNORMAL HIGH
ANA Titer 1: 1:1280 {titer} — ABNORMAL HIGH

## 2019-11-22 LAB — RFLX DRVVT CONFRIM: DRVVT CONFIRM: POSITIVE — AB

## 2019-11-22 LAB — CARDIOLIPIN ANTIBODIES, IGG, IGM, IGA
Anticardiolipin IgA: <11 [APL'U]
Anticardiolipin IgG: <14 [GPL'U]
Anticardiolipin IgM: <12 [MPL'U]

## 2019-11-22 LAB — BETA-2 GLYCOPROTEIN ANTIBODIES
Beta-2 Glyco 1 IgA: <9 SAU (ref <=20)
Beta-2 Glyco 1 IgM: <9 SMU (ref <=20)
Beta-2 Glyco I IgG: <9 SGU (ref <=20)

## 2019-11-22 LAB — ANTI-DNA ANTIBODY, DOUBLE-STRANDED: ds DNA Ab: <1 IU/mL

## 2019-11-22 LAB — CRYOGLOBULIN: Cryoglobulin, Qualitative Analysis: NOT DETECTED

## 2019-11-22 LAB — CK: Total CK: 93 U/L (ref 44–196)

## 2019-11-22 LAB — RFX DRVVT 1:1 MIX

## 2019-11-22 LAB — ANA: Anti Nuclear Antibody (ANA): POSITIVE — AB

## 2019-11-22 LAB — RFLX HEXAGONAL PHASE CONFIRM: Hexagonal Phase Conf: NEGATIVE

## 2019-11-22 LAB — C3 AND C4
C3 Complement: 129 mg/dL (ref 82–185)
C4 Complement: 29 mg/dL (ref 15–53)

## 2019-11-22 NOTE — Progress Notes (Signed)
I will discuss results at the follow-up visit.

## 2019-11-23 NOTE — Progress Notes (Signed)
@Patient  ID: Bradley Ferguson, male    DOB: 03-25-1951, 69 y.o.   MRN: [id number]  Chief Complaint  Patient presents with  . Follow-up    ILD, Copd    Referring provider: [name], MD  HPI:  69 year old male current everyday smoker referred to our office in January/2021 for interstitial lung disease and COPD  PMH: Anxiety, tobacco abuse, bipolar, A. fib, hyperlipidemia, BPH Smoker/ Smoking History: Current Smoker. 0.5 ppd. 50 pack year history.  Maintenance: None Pt of: Dr. Vaughan Browner  11/24/2019  - Visit   69 year old male current smoker followed in our office for COPD and interstitial lung disease.  Patient was initially referred to our office in January/2021.  Patient was referred to rheumatology I have concerns of Raynaud's and potential connective tissue disorder.  Patient was evaluated by rheumatology on 11/17/2019.  Work-up still pending.    Chart review reveals positive ANA with elevated titer.  Patient has upcoming appointment with rheumatology later on today.  Patient does still currently smoke half a pack per day.  He reports that he has tried to stop in the past but has been unsuccessful.  Patient was walked in office today without any significant oxygen desaturations or concerns.  Questionaires / Pulmonary Flowsheets:   MMRC: mMRC Dyspnea Scale mMRC Score  11/24/2019 3    Tests:   Imaging: Low-dose screening CT 01/06/19-small pulmonary nodules measuring up to 4 mm.  Peripheral groundglass attenuation with reticulation, traction bronchiectasis with basal gradient.  No honeycombing.  07/28/2019-CT chest high-res-spectrum of findings compatible basilar predominant fibrotic interstitial lung disease without frank honeycombing without appreciable progression since 01/06/2019.  Findings are categorized as probable UIP, three-vessel coronary arthrosclerosis  Labs: CBC 06/20/2019-WBC 7.1, eos 6.9%, absolute eosinophil count 490  08/29/2019-pulmonary function test-FVC 4.44  (88% duty), postbronchodilator ratio 75, postbronchodilator FEV1 3.28 (88% predicted), no bronchodilator response, DLCO 20.37 (70%)  FENO:  No results found for: NITRICOXIDE  PFT: PFT Results Latest Ref Rng & Units 08/29/2019  FVC-Pre L 4.44  FVC-Predicted Pre % 88  FVC-Post L 4.40  FVC-Predicted Post % 87  Pre FEV1/FVC % % 71  Post FEV1/FCV % % 75  FEV1-Pre L 3.17  FEV1-Predicted Pre % 85  FEV1-Post L 3.28  DLCO UNC% % 70  DLCO COR %Predicted % 86  TLC L 5.96  TLC % Predicted % 78  RV % Predicted % 64    WALK:  No flowsheet data found.  Imaging: No results found.  Lab Results:  CBC    Component Value Date/Time   WBC 9.1 09/29/2019 1438   RBC 4.73 09/29/2019 1438   HGB 14.4 09/29/2019 1438   HGB 13.9 04/16/2017 1628   HCT 43.3 09/29/2019 1438   HCT 41.6 04/16/2017 1628   PLT 161 09/29/2019 1438   PLT 149 (L) 04/16/2017 1628   MCV 91.5 09/29/2019 1438   MCV 91 04/16/2017 1628   MCH 30.4 09/29/2019 1438   MCHC 33.3 09/29/2019 1438   RDW 13.4 09/29/2019 1438   RDW 14.9 04/16/2017 1628   LYMPHSABS 1,947 09/29/2019 1438   LYMPHSABS 2.1 04/16/2017 1628   MONOABS 630 02/05/2017 1234   EOSABS 173 09/29/2019 1438   EOSABS 0.3 04/16/2017 1628   BASOSABS 91 09/29/2019 1438   BASOSABS 0.1 04/16/2017 1628    BMET    Component Value Date/Time   NA 142 09/29/2019 1438   NA 138 05/09/2019 0851   K 4.3 09/29/2019 1438   CL 106 09/29/2019 1438   CO2 27  09/29/2019 1438   GLUCOSE 97 09/29/2019 1438   BUN 16 09/29/2019 1438   BUN 17 05/09/2019 0851   CREATININE 0.97 09/29/2019 1438   CALCIUM 10.0 09/29/2019 1438   GFRNONAA 80 09/29/2019 1438   GFRAA 93 09/29/2019 1438    BNP No results found for: BNP  ProBNP No results found for: PROBNP  Specialty Problems      Pulmonary Problems   Chronic obstructive pulmonary disease (HCC)   Centrilobular emphysema (HCC)   Interstitial pulmonary disease (HCC)      Allergies  Allergen Reactions  . Effexor  [Venlafaxine]     Feels bad  . Wellbutrin [Bupropion]     sleepy  . Zoloft [Sertraline Hcl]     Felt bad    Immunization History  Administered Date(s) Administered  . Influenza Whole 04/30/2010  . Influenza, High Dose Seasonal PF 03/20/2017, 03/29/2018, 04/06/2019  . PFIZER SARS-COV-2 Vaccination 08/04/2019, 08/29/2019  . Pneumococcal Conjugate-13 02/05/2017  . Pneumococcal Polysaccharide-23 04/30/2010, 03/29/2018  . Td 06/30/2013    Past Medical History:  Diagnosis Date  . Anxiety   . Atrial fibrillation ([place])   . Blood transfusion without reported diagnosis   . Cataract   . Depression   . Emphysema of lung ([place])   . Heart murmur   . Hyperlipidemia   . PVD (peripheral vascular disease) ([place])   . Raynaud's syndrome     Tobacco History: Social History   Tobacco Use  Smoking Status Current Every Day Smoker  . Packs/day: 0.75  . Years: 50.00  . Pack years: 37.50  . Types: Cigarettes  . Start date: 42  Smokeless Tobacco Never Used  Tobacco Comment   11/24/19-3/4 of a pack a day   Ready to quit: No Counseling given: Yes Comment: 11/24/19-3/4 of a pack a day   Continue to not smoke  Outpatient Encounter Medications as of 11/24/2019  Medication Sig  . ALPRAZolam (XANAX) 1 MG tablet Take 1/2-1 tablet 2 - 3 x /day ONLY if needed for Anxiety Attack &  limit to 5 days /week to avoid addiction  . aspirin EC 81 MG tablet Take 81 mg by mouth daily.  Marland Kitchen atorvastatin (LIPITOR) 80 MG tablet Take 1 tablet Daily for Cholesterol  . CHOLECALCIFEROL PO Take 10,000 Units by mouth daily.  Marland Kitchen dofetilide (TIKOSYN) 500 MCG capsule TAKE ONE CAPSULE BY MOUTH TWO TIMES DAILY  . Fluticasone-Umeclidin-Vilant (TRELEGY ELLIPTA) 100-62.5-25 MCG/INH AEPB Inhale 1 puff into the lungs daily.  Marland Kitchen ketoconazole (NIZORAL) 2 % cream Apply to rash 1 to 2 x /day  . rivaroxaban (XARELTO) 20 MG TABS tablet Take 1 tablet Daily to Prevent Blood Clots  . triamcinolone cream (KENALOG) 0.5 % Apply 1  application topically 2 (two) times daily.   No facility-administered encounter medications on file as of 11/24/2019.     Review of Systems  Review of Systems  Constitutional: Positive for fatigue. Negative for activity change, chills, fever and unexpected weight change.  HENT: Negative for postnasal drip, rhinorrhea, sinus pressure, sinus pain and sore throat.   Eyes: Negative.   Respiratory: Positive for cough (white sputum, worse in am ). Negative for shortness of breath and wheezing.   Cardiovascular: Negative for chest pain and palpitations.  Gastrointestinal: Negative for constipation, diarrhea, nausea and vomiting.  Endocrine: Negative.   Genitourinary: Negative.   Musculoskeletal: Negative.   Skin: Negative.   Neurological: Negative for dizziness and headaches.  Psychiatric/Behavioral: Negative.  Negative for dysphoric mood. The patient is not nervous/anxious.  All other systems reviewed and are negative.    Physical Exam  BP (!) 110/58 (BP Location: Left Arm, Cuff Size: Normal)   Pulse 90   Temp 98.4 F (36.9 C) (Oral)   Ht 6' 1.5" (1.867 m)   Wt 185 lb 6.4 oz (84.1 kg)   SpO2 100%   BMI 24.13 kg/m   Wt Readings from Last 5 Encounters:  11/24/19 187 lb 12.8 oz (85.2 kg)  11/24/19 185 lb 6.4 oz (84.1 kg)  11/17/19 189 lb (85.7 kg)  09/29/19 187 lb 9.6 oz (85.1 kg)  08/01/19 194 lb 9.6 oz (88.3 kg)    BMI Readings from Last 5 Encounters:  11/24/19 24.44 kg/m  11/24/19 24.13 kg/m  11/17/19 24.60 kg/m  09/29/19 24.75 kg/m  08/01/19 25.33 kg/m     Physical Exam Vitals and nursing note reviewed.  Constitutional:      General: He is not in acute distress.    Appearance: Normal appearance. He is obese.  HENT:     Head: Normocephalic and atraumatic.     Right Ear: Hearing and external ear normal.     Left Ear: Hearing and external ear normal.     Nose: Nose normal. No mucosal edema or rhinorrhea.     Right Turbinates: Not enlarged.     Left  Turbinates: Not enlarged.     Mouth/Throat:     Mouth: Mucous membranes are dry.     Pharynx: Oropharynx is clear. No oropharyngeal exudate.  Eyes:     Pupils: Pupils are equal, round, and reactive to light.  Cardiovascular:     Rate and Rhythm: Normal rate and regular rhythm.     Pulses: Normal pulses.     Heart sounds: Normal heart sounds. No murmur.  Pulmonary:     Effort: Pulmonary effort is normal.     Breath sounds: No decreased breath sounds, wheezing or rales.  Musculoskeletal:     Cervical back: Normal range of motion.     Right lower leg: No edema.     Left lower leg: No edema.  Lymphadenopathy:     Cervical: No cervical adenopathy.  Skin:    General: Skin is warm and dry.     Capillary Refill: Capillary refill takes less than 2 seconds.     Findings: No erythema or rash.     Comments: Distal clubbing on hands  Neurological:     General: No focal deficit present.     Mental Status: He is alert and oriented to person, place, and time.     Motor: No weakness.     Coordination: Coordination normal.     Gait: Gait is intact. Gait normal.  Psychiatric:        Mood and Affect: Mood normal.        Behavior: Behavior normal. Behavior is cooperative.        Thought Content: Thought content normal.        Judgment: Judgment normal.       Assessment & Plan:   Discussion: Considerable amount of appointment time spent reviewing high-resolution CT chest imaging as well as pulmonary function test.  Also reviewed plan of care with patient.  Reviewed lab results in chart with patient as well that show elevated ANA titer.  Emphasized need to keep follow-up with rheumatology later on today.  Highly likely patient has CTD related ILD.  Reviewed case with Dr. Vaughan Browner as well.  Centrilobular emphysema ([place]) Plan: Emphasized need to stop smoking Continue Trelegy Ellipta  Walk today in office We will repeat pulmonary function test spirometry with DLCO in September/2021  TOBACCO  ABUSE Current smoker  Plan: Emphasized need to stop smoking  Interstitial pulmonary disease (HCC) Probable UIP on high-resolution CT chest Positive ANA with elevated titer Currently undergoing work-up with rheumatology  Plan: Walk today in office We will repeat spirometry with DLCO to monitor lung functioning in September/2021 Keep follow-up with rheumatology later on today Likely will need to be started on immunosuppression for suspected CTD related ILD  Positive ANA (antinuclear antibody) Plan: Continue work-up with rheumatology, highly likely patient has CT related ILD  Addendum: Patient has completed follow-up with rheumatology.  Patient will be started on azathioprine   Return in about 3 months (around 02/24/2020), or if symptoms worsen or fail to improve, for Follow up with Dr. Vaughan Browner.   Lauraine Rinne, NP 11/24/2019   This appointment required 42 minutes of patient care (this includes precharting, chart review, review of results, face-to-face care, etc.).

## 2019-11-24 ENCOUNTER — Ambulatory Visit (INDEPENDENT_AMBULATORY_CARE_PROVIDER_SITE_OTHER): Payer: PPO | Admitting: Pulmonary Disease

## 2019-11-24 ENCOUNTER — Encounter: Payer: Self-pay | Admitting: Pulmonary Disease

## 2019-11-24 ENCOUNTER — Telehealth: Payer: Self-pay

## 2019-11-24 ENCOUNTER — Telehealth: Payer: Self-pay | Admitting: Pulmonary Disease

## 2019-11-24 ENCOUNTER — Ambulatory Visit (INDEPENDENT_AMBULATORY_CARE_PROVIDER_SITE_OTHER): Payer: PPO | Admitting: Rheumatology

## 2019-11-24 ENCOUNTER — Other Ambulatory Visit: Payer: Self-pay

## 2019-11-24 ENCOUNTER — Encounter: Payer: Self-pay | Admitting: Rheumatology

## 2019-11-24 VITALS — BP 107/57 | HR 86 | Resp 16 | Ht 73.5 in | Wt 187.8 lb

## 2019-11-24 VITALS — BP 110/58 | HR 90 | Temp 98.4°F | Ht 73.5 in | Wt 185.4 lb

## 2019-11-24 DIAGNOSIS — I700 Atherosclerosis of aorta: Secondary | ICD-10-CM

## 2019-11-24 DIAGNOSIS — I480 Paroxysmal atrial fibrillation: Secondary | ICD-10-CM

## 2019-11-24 DIAGNOSIS — J849 Interstitial pulmonary disease, unspecified: Secondary | ICD-10-CM

## 2019-11-24 DIAGNOSIS — Z79899 Other long term (current) drug therapy: Secondary | ICD-10-CM

## 2019-11-24 DIAGNOSIS — I739 Peripheral vascular disease, unspecified: Secondary | ICD-10-CM

## 2019-11-24 DIAGNOSIS — D6869 Other thrombophilia: Secondary | ICD-10-CM | POA: Diagnosis not present

## 2019-11-24 DIAGNOSIS — Z8709 Personal history of other diseases of the respiratory system: Secondary | ICD-10-CM | POA: Diagnosis not present

## 2019-11-24 DIAGNOSIS — Z86010 Personal history of colonic polyps: Secondary | ICD-10-CM

## 2019-11-24 DIAGNOSIS — F17200 Nicotine dependence, unspecified, uncomplicated: Secondary | ICD-10-CM

## 2019-11-24 DIAGNOSIS — E559 Vitamin D deficiency, unspecified: Secondary | ICD-10-CM

## 2019-11-24 DIAGNOSIS — I7300 Raynaud's syndrome without gangrene: Secondary | ICD-10-CM | POA: Diagnosis not present

## 2019-11-24 DIAGNOSIS — F329 Major depressive disorder, single episode, unspecified: Secondary | ICD-10-CM

## 2019-11-24 DIAGNOSIS — R768 Other specified abnormal immunological findings in serum: Secondary | ICD-10-CM

## 2019-11-24 DIAGNOSIS — M349 Systemic sclerosis, unspecified: Secondary | ICD-10-CM

## 2019-11-24 DIAGNOSIS — F419 Anxiety disorder, unspecified: Secondary | ICD-10-CM | POA: Diagnosis not present

## 2019-11-24 DIAGNOSIS — Z8639 Personal history of other endocrine, nutritional and metabolic disease: Secondary | ICD-10-CM

## 2019-11-24 DIAGNOSIS — J432 Centrilobular emphysema: Secondary | ICD-10-CM | POA: Insufficient documentation

## 2019-11-24 DIAGNOSIS — R760 Raised antibody titer: Secondary | ICD-10-CM

## 2019-11-24 NOTE — Patient Instructions (Addendum)
You were seen today by Lauraine Rinne, NP  for:   1. Interstitial pulmonary disease ([place])  - Pulmonary function test; Future  Walk today in office  We will repeat a pulmonary function test to evaluate your breathing and September/2021  We believe you likely have a connective tissue disorder based off your blood work.  Keep follow-up with rheumatology later on today  2. Centrilobular emphysema (HCC)  Trelegy Ellipta  >>> 1 puff daily in the morning >>>rinse mouth out after use  >>> This inhaler contains 3 medications that help manage her respiratory status, contact our office if you cannot afford this medication or unable to remain on this medication  Note your daily symptoms > remember "red flags" for COPD:   >>>Increase in cough >>>increase in sputum production >>>increase in shortness of breath or activity  intolerance.   If you notice these symptoms, please call the office to be seen.   3. TOBACCO ABUSE  We recommend that you stop smoking.  >>>You need to set a quit date >>>If you have friends or family who smoke, let them know you are trying to quit and not to smoke around you or in your living environment  Smoking Cessation Resources:  1 800 QUIT NOW  >>> Patient to call this resource and utilize it to help support her quit smoking >>> Keep up your hard work with stopping smoking  You can also contact the [facility] >>>For smoking cessation classes call [phone]  We do not recommend using e-cigarettes as a form of stopping smoking  You can sign up for smoking cessation support texts and information:  >>>https://smokefree.gov/smokefreetxt   4. Positive ANA (antinuclear antibody)  Keep follow-up with rheumatology today  I believe you likely have a connective tissue disorder based off your lab work that they completed.  They should review this with you today in their office.  He will likely be started on medications to help suppress this.   We  recommend today:  Orders Placed This Encounter  Procedures  . Pulmonary function test    Arlyce Harman with dlco - in sept/2021    Standing Status:   Future    Standing Expiration Date:   11/23/2020    Order Specific Question:   Where should this test be performed?    Answer:   [facility]   Orders Placed This Encounter  Procedures  . Pulmonary function test   No orders of the defined types were placed in this encounter.   Follow Up:    Return in about 3 months (around 02/24/2020), or if symptoms worsen or fail to improve, for Follow up with Dr. Vaughan Browner.  30-minute spirometry with DLCO on pulmonary function test in September/2021  Please do your part to reduce the spread of COVID-19:      Reduce your risk of any infection  and COVID19 by using the similar precautions used for avoiding the common cold or flu:  Marland Kitchen Wash your hands often with soap and warm water for at least 20 seconds.  If soap and water are not readily available, use an alcohol-based hand sanitizer with at least 60% alcohol.  . If coughing or sneezing, cover your mouth and nose by coughing or sneezing into the elbow areas of your shirt or coat, into a tissue or into your sleeve (not your hands). Langley Gauss A MASK when in public  . Avoid shaking hands with others and consider head nods or verbal greetings only. . Avoid touching  your eyes, nose, or mouth with unwashed hands.  . Avoid close contact with people who are sick. . Avoid places or events with large numbers of people in one location, like concerts or sporting events. . If you have some symptoms but not all symptoms, continue to monitor at home and seek medical attention if your symptoms worsen. . If you are having a medical emergency, call 911.   [place] / e-Visit: [email or web address]         [facility]: [facility]:  [id number]                   [facility]: [id number]     It is flu season:   >>> Best ways to protect herself from the flu: Receive the yearly flu vaccine, practice good hand hygiene washing with soap and also using hand sanitizer when available, eat a nutritious meals, get adequate rest, hydrate appropriately   Please contact the office if your symptoms worsen or you have concerns that you are not improving.   Thank you for choosing [facility] for your healthcare, and for allowing [place] to partner with you on your healthcare journey. I am thankful to be able to provide care to you today.   Wyn Quaker FNP-C

## 2019-11-24 NOTE — Assessment & Plan Note (Signed)
Current smoker ° °Plan: °Emphasized need to stop smoking °

## 2019-11-24 NOTE — Assessment & Plan Note (Signed)
Probable UIP on high-resolution CT chest Positive ANA with elevated titer Currently undergoing work-up with rheumatology  Plan: Walk today in office We will repeat spirometry with DLCO to monitor lung functioning in September/2021 Keep follow-up with rheumatology later on today Likely will need to be started on immunosuppression for suspected CTD related ILD

## 2019-11-24 NOTE — Telephone Encounter (Signed)
Per Dr. Estanislado Pandy, If the labs are normal then we can start him on Imuran 50 mg p.o. daily.  Labs will be checked in 2 weeks.  If labs are normal then we can increase Imuran 100 mg p.o. daily.  Labs will be again checked in 2 weeks, 2 months and then every 3 months if stable.

## 2019-11-24 NOTE — Progress Notes (Deleted)
Office Visit Note  Patient: Bradley Ferguson             Date of Birth: [birth year]           MRN: [id number]             PCP: [name], MD Referring: [name], MD Visit Date: 11/24/2019 Occupation: @GUAROCC @  Subjective:  Follow-up (results)   History of Present Illness: Bradley Ferguson is a 69 y.o. male ***   Activities of Daily Living:  Patient reports morning stiffness for 0 minutes.   Patient Denies nocturnal pain.  Difficulty dressing/grooming: Denies Difficulty climbing stairs: Reports Difficulty getting out of chair: Denies Difficulty using hands for taps, buttons, cutlery, and/or writing: Denies  Review of Systems  Constitutional: Positive for fatigue.  HENT: Positive for nosebleeds. Negative for mouth sores, mouth dryness and nose dryness.   Eyes: Negative for itching and dryness.  Respiratory: Negative for shortness of breath and difficulty breathing.   Cardiovascular: Negative for chest pain and palpitations.  Gastrointestinal: Negative for blood in stool, constipation and diarrhea.  Endocrine: Negative for increased urination.  Genitourinary: Negative for difficulty urinating.  Musculoskeletal: Negative for arthralgias, joint pain, joint swelling, myalgias, morning stiffness, muscle tenderness and myalgias.  Skin: Negative for rash and redness.  Allergic/Immunologic: Negative for susceptible to infections.  Neurological: Positive for weakness. Negative for dizziness, numbness, headaches and memory loss.  Hematological: Positive for bruising/bleeding tendency.  Psychiatric/Behavioral: Negative for confusion.    PMFS History:  Patient Active Problem List   Diagnosis Date Noted  . Centrilobular emphysema ([place]) 11/24/2019  . Atherosclerosis of aorta ([place]) 06/30/2019  . Chronic obstructive pulmonary disease ([place]) 06/30/2019  . Acquired thrombophilia ([place]) 05/10/2019  . Colon polyps 12/06/2018  . Visit for monitoring Tikosyn therapy 04/27/2018  .  Claudication in peripheral vascular disease ([place]) 04/16/2017  . Labile hypertension 02/29/2016  . Other abnormal glucose (prediabetes) 02/29/2016  . BPH 02/29/2016  . Hyperlipidemia, mixed 11/12/2015  . Vitamin D deficiency 11/12/2015  . Bipolar I disorder ([place]) 08/13/2015  . Atrial fibrillation ([place]) 08/13/2015  . Attention deficit disorder 08/08/2010  . Dental caries 08/08/2010  . ANXIETY DEPRESSION 06/06/2010  . TOBACCO ABUSE 06/06/2010  . RAYNAUDS SYNDROME 06/06/2010    Past Medical History:  Diagnosis Date  . Anxiety   . Atrial fibrillation ([place])   . Blood transfusion without reported diagnosis   . Cataract   . Depression   . Emphysema of lung ([place])   . Heart murmur   . Hyperlipidemia   . PVD (peripheral vascular disease) ([place])   . Raynaud's syndrome     Family History  Problem Relation Age of Onset  . Cancer Mother        small cell lung cancer  . Cancer Father        unsure type  . Colon cancer Neg Hx   . Rectal cancer Neg Hx    Past Surgical History:  Procedure Laterality Date  . ATRIAL FIBRILLATION ABLATION    . CATARACT EXTRACTION Right   . FRACTURE SURGERY    . LOWER EXTREMITY INTERVENTION N/A 04/27/2017   Procedure: LOWER EXTREMITY INTERVENTION;  Surgeon: Lorretta Harp, MD;  Location: [facility];  Service: Cardiovascular;  Laterality: N/A;  . PERIPHERAL VASCULAR ATHERECTOMY Right 04/27/2017   Procedure: PERIPHERAL VASCULAR ATHERECTOMY;  Surgeon: Lorretta Harp, MD;  Location: [facility];  Service: Cardiovascular;  Laterality: Right;  Iliac  . PERIPHERAL VASCULAR INTERVENTION Right 04/27/2017  Procedure: PERIPHERAL VASCULAR INTERVENTION;  Surgeon: Lorretta Harp, MD;  Location: [facility];  Service: Cardiovascular;  Laterality: Right;  Iliac   Social History   Social History Narrative  . Not on file   Immunization History  Administered Date(s) Administered  . Influenza Whole 04/30/2010  . Influenza, High Dose  Seasonal PF 03/20/2017, 03/29/2018, 04/06/2019  . PFIZER SARS-COV-2 Vaccination 08/04/2019, 08/29/2019  . Pneumococcal Conjugate-13 02/05/2017  . Pneumococcal Polysaccharide-23 04/30/2010, 03/29/2018  . Td 06/30/2013     Objective: Vital Signs: BP (!) 107/57 (BP Location: Left Arm, Patient Position: Sitting, Cuff Size: Normal)   Pulse 86   Resp 16   Ht 6' 1.5" (1.867 m)   Wt 187 lb 12.8 oz (85.2 kg)   BMI 24.44 kg/m    Physical Exam   Musculoskeletal Exam: ***  CDAI Exam: CDAI Score: -- Patient Global: --; Provider Global: -- Swollen: --; Tender: -- Joint Exam 11/24/2019   No joint exam has been documented for this visit   There is currently no information documented on the homunculus. Go to the Rheumatology activity and complete the homunculus joint exam.  Investigation: No additional findings.  Imaging: No results found.  Recent Labs: Lab Results  Component Value Date   WBC 9.1 09/29/2019   HGB 14.4 09/29/2019   PLT 161 09/29/2019   NA 142 09/29/2019   K 4.3 09/29/2019   CL 106 09/29/2019   CO2 27 09/29/2019   GLUCOSE 97 09/29/2019   BUN 16 09/29/2019   CREATININE 0.97 09/29/2019   BILITOT 0.8 09/29/2019   ALKPHOS 73 08/10/2017   AST 26 09/29/2019   ALT 16 09/29/2019   PROT 7.3 09/29/2019   ALBUMIN 4.7 08/10/2017   CALCIUM 10.0 09/29/2019   GFRAA 93 09/29/2019    Speciality Comments: No specialty comments available.  Procedures:  No procedures performed Allergies: Effexor [venlafaxine], Wellbutrin [bupropion], and Zoloft [sertraline hcl]   Assessment / Plan:     Visit Diagnoses: No diagnosis found.  Orders: No orders of the defined types were placed in this encounter.  No orders of the defined types were placed in this encounter.   Face-to-face time spent with patient was *** minutes. Greater than 50% of time was spent in counseling and coordination of care.  Follow-Up Instructions: No follow-ups on file.   Earnestine Mealing, CMA  Note  - This record has been created using Editor, commissioning.  Chart creation errors have been sought, but may not always  have been located. Such creation errors do not reflect on  the standard of medical care.

## 2019-11-24 NOTE — Assessment & Plan Note (Signed)
Plan: Continue work-up with rheumatology, highly likely patient has CT related ILD

## 2019-11-24 NOTE — Patient Instructions (Addendum)
Azathioprine tablets What is this medicine? AZATHIOPRINE (ay za THYE oh preen) suppresses the immune system. It is used to prevent organ rejection after a transplant. It is also used to treat rheumatoid arthritis. This medicine may be used for other purposes; ask your health care provider or pharmacist if you have questions. COMMON BRAND NAME(S): Azasan, Imuran What should I tell my health care provider before I take this medicine? They need to know if you have any of these conditions:  infection  kidney disease  liver disease  an unusual or allergic reaction to azathioprine, other medicines, lactose, foods, dyes, or preservatives  pregnant or trying to get pregnant  breast feeding How should I use this medicine? Take this medicine by mouth with a full glass of water. Follow the directions on the prescription label. Take your medicine at regular intervals. Do not take your medicine more often than directed. Continue to take your medicine even if you feel better. Do not stop taking except on your doctor's advice. Talk to your pediatrician regarding the use of this medicine in children. Special care may be needed. Overdosage: If you think you have taken too much of this medicine contact a poison control center or emergency room at once. NOTE: This medicine is only for you. Do not share this medicine with others. What if I miss a dose? If you miss a dose, take it as soon as you can. If it is almost time for your next dose, take only that dose. Do not take double or extra doses. What may interact with this medicine? Do not take this medicine with any of the following medications:  febuxostat  mercaptopurine This medicine may also interact with the following medications:  allopurinol  aminosalicylates like sulfasalazine, mesalamine, balsalazide, and olsalazine  leflunomide  medicines called ACE inhibitors like benazepril, captopril, enalapril, fosinopril, quinapril, lisinopril,  ramipril, and trandolapril  mycophenolate  sulfamethoxazole; trimethoprim  vaccines  warfarin This list may not describe all possible interactions. Give your health care provider a list of all the medicines, herbs, non-prescription drugs, or dietary supplements you use. Also tell them if you smoke, drink alcohol, or use illegal drugs. Some items may interact with your medicine. What should I watch for while using this medicine? Visit your doctor or health care professional for regular checks on your progress. You will need frequent blood checks during the first few months you are receiving the medicine. If you get a cold or other infection while receiving this medicine, call your doctor or health care professional. Do not treat yourself. The medicine may increase your risk of getting an infection. Women should inform their doctor if they wish to become pregnant or think they might be pregnant. There is a potential for serious side effects to an unborn child. Talk to your health care professional or pharmacist for more information. Men may have a reduced sperm count while they are taking this medicine. Talk to your health care professional for more information. This medicine may increase your risk of getting certain kinds of cancer. Talk to your doctor about healthy lifestyle choices, important screenings, and your risk. What side effects may I notice from receiving this medicine? Side effects that you should report to your doctor or health care professional as soon as possible:  allergic reactions like skin rash, itching or hives, swelling of the face, lips, or tongue  changes in vision  confusion  fever, chills, or any other sign of infection  loss of balance or coordination  severe stomach pain  unusual bleeding, bruising  unusually weak or tired  vomiting  yellowing of the eyes or skin Side effects that usually do not require medical attention (report to your doctor or health  care professional if they continue or are bothersome):  hair loss  nausea This list may not describe all possible side effects. Call your doctor for medical advice about side effects. You may report side effects to FDA at 1-800-FDA-1088. Where should I keep my medicine? Keep out of the reach of children. Store at room temperature between 15 and 25 degrees C (59 and 77 degrees F). Protect from light. Throw away any unused medicine after the expiration date. NOTE: This sheet is a summary. It may not cover all possible information. If you have questions about this medicine, talk to your doctor, pharmacist, or health care provider.  2020 Elsevier/Gold Standard (2013-10-11 12:00:31)   Scleroderma Scleroderma is a rare and long-term (chronic) disease of the immune system. The immune system protects the body by attacking germs that cause illness. If you have scleroderma, your immune system mistakenly attacks your skin and other parts of your body instead. This is called an autoimmune disease. Scleroderma means hardening of the skin. If you have a mild form of this condition, it may affect only your skin (localized scleroderma). If you have a severe form, it may affect your skin and also your blood vessels, lungs, kidneys, heart, and digestive system (systemic scleroderma). What are the causes? The cause of this condition is not known. What increases the risk? You are more likely to develop this condition if:  You have a family history of scleroderma.  You are male.  You are 28-40 years old. What are the signs or symptoms? Symptoms of this condition depend on the type of scleroderma you have. They also vary from person to person. Symptoms of localized scleroderma may include:  Discolored patches of skin (morphea). These may be thick and waxy.  Bands of thick, hard skin on your arms, legs, or face (linear scleroderma).  Tightening of the skin that limits how well you can move your joints.   Open skin sores. Symptoms of systemic scleroderma may include:  Discoloration of the fingers and sometimes the toes (Raynaud's phenomenon). Your fingers or toes may turn blue, white, or red. You may also have tingling or numbness. Exposure to cold often triggers this symptom.  Tightening of the skin of the fingers, hands, arms, neck, and face.  Enlarged blood vessels of the hands, face, and nail beds (telangiectasias).  Calcium deposits under your skin (calcinosis).  Joint pain.  Heartburn.  High blood pressure.  Constipation.  Trouble swallowing (dysphagia).  Trouble breathing. How is this diagnosed? This condition may be diagnosed based on:  Your symptoms and medical history.  A physical exam.  Tests, such as: ? Imaging studies. This may include X-rays and CT scan. ? Blood tests. ? Lung (pulmonary) function tests. Scleroderma can be hard to diagnose because other diseases have many of the same symptoms. You may need to see specialists as directed by your health care provider. How is this treated? There is no cure for this condition, but treatment can relieve symptoms and prevent complications. Mild symptoms may not need treatment. Treatment may include taking medicines to:  Improve blood flow.  Block production of stomach acid to treat heartburn.  Treat high blood pressure caused by kidney disease.  Relieve joint pain and inflammation.  Treat lung symptoms. Follow these instructions at home:  Medicines  Take over-the-counter and prescription medicines only as told by your health care provider.  Do not drive or use heavy machinery while taking prescription pain medicine. Eating and drinking  Eat a healthy diet.  Drink enough fluid to keep your urine pale yellow.  If you have heartburn: ? Eat smaller meals often. ? Avoid spicy and fatty foods. ? Do not eat meals late in the evening. ? Avoid lying down right after you eat. Lifestyle   If you have  Raynaud's phenomenon, cold can trigger your symptoms. To prevent symptoms, you can: ? Wear mittens, a hat, a scarf, and warm footwear. ? Dress in layers during cold weather. ? If possible, stay indoors during cold weather.  Wear comfortable shoes that are well cushioned.  Protect your skin with sunscreen and moisturizers.  Stretch and exercise regularly.  Maintain a healthy weight. General instructions  Learn as much as you can about scleroderma, and work closely with your team of health care providers.  Do not use any products that contain nicotine or tobacco, such as cigarettes and e-cigarettes. If you need help quitting, ask your health care provider.  Ask your health care provider how to check your blood pressure at home. Check it as directed by your health care provider.  Make sure you have a good support system at home.  Use skin moisturizer to keep your skin moist.  Keep all follow-up visits as told by your health care provider. This is important. Contact a health care provider if:  Your scleroderma symptoms change or become worse.  You have concerns about your mental health. Get help right away if:  A finger or toe becomes painful or numb.  A finger or toe turns black or a very dark color.  You have: ? Swelling, pain, or tenderness in an arm or leg. ? Trouble swallowing. ? Trouble breathing. ? Chest pain.  You cough up blood. Summary  Scleroderma is a rare and long-term (chronic) disease of the immune system. If you have scleroderma, your immune system mistakenly attacks your skin and other parts of your body.  Symptoms of this condition depend on the type of scleroderma you have. They also vary from person to person.  There is no cure for this condition, but treatment can relieve symptoms and prevent complications. This information is not intended to replace advice given to you by your health care provider. Make sure you discuss any questions you have with  your health care provider. Document Revised: 07/24/2017 Document Reviewed: 07/24/2017 Elsevier Patient Education  2020 [place] We placed an order today for your standing lab work.    Please come back and get your standing labs in 2 weeks x 2 after starting Imuran, then in 2 months and then every 3 months.  We have open lab daily Monday through Thursday from 8:30-12:30 PM and 1:30-4:30 PM and Friday from 8:30-12:30 PM and 1:30-4:00 PM at the office of Dr. Bo Merino.   You may experience shorter wait times on Monday and Friday afternoons. The office is located at [address], [place], [place], [place] [address] No appointment is necessary.   Labs are drawn by [facility].  You may receive a bill from [facility] for your lab work.  If you wish to have your labs drawn at another location, please call the office 24 hours in advance to send orders.  If you have any questions regarding directions or hours of operation,  please call [phone].   Just  as a reminder please drink plenty of water prior to coming for your lab work. Thanks!

## 2019-11-24 NOTE — Progress Notes (Signed)
Office Visit Note  Patient: Bradley Ferguson             Date of Birth: Mar 07, 1951           MRN: [id number]             PCP: [name], MD Referring: [name], MD Visit Date: 11/24/2019 Occupation: @GUAROCC @  Subjective:  Shortness of breath   History of Present Illness: Bradley Ferguson is a 69 y.o. male history of Raynaud's phenomenon and shortness of breath.  He returns for follow-up visit today.  He had an appointment with the pulmonologist today.  He will have future PFTs.  None of the joints are painful.  He continues to have significant shortness of breath.  He states he has difficulty climbing stairs and walking due to shortness of breath.  Activities of Daily Living:  Patient reports morning stiffness for 0 minute.   Patient Denies nocturnal pain.  Difficulty dressing/grooming: Denies Difficulty climbing stairs: Reports shortness of breath and leg pain Difficulty getting out of chair: Denies Difficulty using hands for taps, buttons, cutlery, and/or writing: Denies  Review of Systems  Constitutional: Positive for fatigue. Negative for night sweats.  HENT: Negative for mouth sores, mouth dryness and nose dryness.   Eyes: Negative for redness and dryness.  Respiratory: Positive for shortness of breath. Negative for difficulty breathing.   Cardiovascular: Negative for chest pain, palpitations, hypertension, irregular heartbeat and swelling in legs/feet.  Gastrointestinal: Negative for constipation and diarrhea.  Endocrine: Negative for increased urination.  Musculoskeletal: Negative for arthralgias, joint pain, joint swelling, myalgias, muscle weakness, morning stiffness, muscle tenderness and myalgias.  Skin: Positive for color change. Negative for rash, hair loss, nodules/bumps, skin tightness, ulcers and sensitivity to sunlight.  Allergic/Immunologic: Negative for susceptible to infections.  Neurological: Negative for dizziness, fainting, memory loss, night  sweats and weakness ( ).  Hematological: Negative for swollen glands.  Psychiatric/Behavioral: Positive for depressed mood and sleep disturbance. The patient is nervous/anxious.     PMFS History:  Patient Active Problem List   Diagnosis Date Noted  . Centrilobular emphysema ([place]) 11/24/2019  . Atherosclerosis of aorta ([place]) 06/30/2019  . Chronic obstructive pulmonary disease ([place]) 06/30/2019  . Acquired thrombophilia ([place]) 05/10/2019  . Colon polyps 12/06/2018  . Visit for monitoring Tikosyn therapy 04/27/2018  . Claudication in peripheral vascular disease ([place]) 04/16/2017  . Labile hypertension 02/29/2016  . Other abnormal glucose (prediabetes) 02/29/2016  . BPH 02/29/2016  . Hyperlipidemia, mixed 11/12/2015  . Vitamin D deficiency 11/12/2015  . Bipolar I disorder ([place]) 08/13/2015  . Atrial fibrillation ([place]) 08/13/2015  . Attention deficit disorder 08/08/2010  . Dental caries 08/08/2010  . ANXIETY DEPRESSION 06/06/2010  . TOBACCO ABUSE 06/06/2010  . RAYNAUDS SYNDROME 06/06/2010    Past Medical History:  Diagnosis Date  . Anxiety   . Atrial fibrillation ([place])   . Blood transfusion without reported diagnosis   . Cataract   . Depression   . Emphysema of lung ([place])   . Heart murmur   . Hyperlipidemia   . PVD (peripheral vascular disease) ([place])   . Raynaud's syndrome     Family History  Problem Relation Age of Onset  . Cancer Mother        small cell lung cancer  . Cancer Father        unsure type  . Colon cancer Neg Hx   . Rectal cancer Neg Hx    Past Surgical History:  Procedure Laterality Date  .  ATRIAL FIBRILLATION ABLATION    . CATARACT EXTRACTION Right   . FRACTURE SURGERY    . LOWER EXTREMITY INTERVENTION N/A 04/27/2017   Procedure: LOWER EXTREMITY INTERVENTION;  Surgeon: Lorretta Harp, MD;  Location: [facility];  Service: Cardiovascular;  Laterality: N/A;  . PERIPHERAL VASCULAR ATHERECTOMY Right 04/27/2017   Procedure: PERIPHERAL VASCULAR  ATHERECTOMY;  Surgeon: Lorretta Harp, MD;  Location: [facility];  Service: Cardiovascular;  Laterality: Right;  Iliac  . PERIPHERAL VASCULAR INTERVENTION Right 04/27/2017   Procedure: PERIPHERAL VASCULAR INTERVENTION;  Surgeon: Lorretta Harp, MD;  Location: [facility];  Service: Cardiovascular;  Laterality: Right;  Iliac   Social History   Social History Narrative  . Not on file   Immunization History  Administered Date(s) Administered  . Influenza Whole 04/30/2010  . Influenza, High Dose Seasonal PF 03/20/2017, 03/29/2018, 04/06/2019  . PFIZER SARS-COV-2 Vaccination 08/04/2019, 08/29/2019  . Pneumococcal Conjugate-13 02/05/2017  . Pneumococcal Polysaccharide-23 04/30/2010, 03/29/2018  . Td 06/30/2013     Objective: Vital Signs: BP (!) 107/57 (BP Location: Left Arm, Patient Position: Sitting, Cuff Size: Normal)   Pulse 86   Resp 16   Ht 6' 1.5" (1.867 m)   Wt 187 lb 12.8 oz (85.2 kg)   BMI 24.44 kg/m    Physical Exam Vitals and nursing note reviewed.  Constitutional:      Appearance: He is well-developed.  HENT:     Head: Normocephalic and atraumatic.  Eyes:     Conjunctiva/sclera: Conjunctivae normal.     Pupils: Pupils are equal, round, and reactive to light.  Cardiovascular:     Rate and Rhythm: Normal rate and regular rhythm.     Heart sounds: Normal heart sounds.  Pulmonary:     Effort: Pulmonary effort is normal.     Breath sounds: Normal breath sounds.  Abdominal:     General: Bowel sounds are normal.     Palpations: Abdomen is soft.  Musculoskeletal:     Cervical back: Normal range of motion and neck supple.  Skin:    General: Skin is warm and dry.     Capillary Refill: Capillary refill takes less than 2 seconds.     Comments: Skin tightness was noted distal to her is her MCPs.  Telangiectasias were noted on bilateral palms.  Decreased capillary refill was noted.  No digital ulcers were noted.  Nailbed capillary changes were noted on  capillaroscopy.  Neurological:     Mental Status: He is alert and oriented to person, place, and time.  Psychiatric:        Behavior: Behavior normal.      Musculoskeletal Exam: C-spine was in range of motion.  Shoulder joints and elbow joints with good range of motion.  He has bilateral incomplete fist formation and DIP and PIP thickening.  Hip joints and knee joints in good range of motion.  CDAI Exam: CDAI Score: -- Patient Global: --; Provider Global: -- Swollen: --; Tender: -- Joint Exam 11/24/2019   No joint exam has been documented for this visit   There is currently no information documented on the homunculus. Go to the Rheumatology activity and complete the homunculus joint exam.  Investigation: No additional findings.  Imaging: No results found.  Recent Labs: Lab Results  Component Value Date   WBC 9.1 09/29/2019   HGB 14.4 09/29/2019   PLT 161 09/29/2019   NA 142 09/29/2019   K 4.3 09/29/2019   CL 106 09/29/2019  CO2 27 09/29/2019   GLUCOSE 97 09/29/2019   BUN 16 09/29/2019   CREATININE 0.97 09/29/2019   BILITOT 0.8 09/29/2019   ALKPHOS 73 08/10/2017   AST 26 09/29/2019   ALT 16 09/29/2019   PROT 7.3 09/29/2019   ALBUMIN 4.7 08/10/2017   CALCIUM 10.0 09/29/2019   GFRAA 93 09/29/2019  Nov 25, 2019 ANA 1: 1280 nucleolar, nucleolar speckled, ENA (Smith, RNP, dsDNA negative), C3-C4 normal, anticardiolipin negative, beta-2 negative, lupus anticoagulant positive, CK 93, aldolase 5.1, cryoglobulin negative  07/07/19: ANA 1:320NS, 1:1280NN, Ro-, La-, Scl-70-, ANCA-, CCP<16, RF<14, myomarker3: Anti-SAE1 Ab 62,  Speciality Comments: No specialty comments available.  Procedures:  No procedures performed Allergies: Effexor [venlafaxine], Wellbutrin [bupropion], and Zoloft [sertraline hcl]   Assessment / Plan:     Visit Diagnoses: Limited systemic sclerosis ([place]) - Positive ANA nucleolar pattern, sclerodactyly, telangiectasias, positive lupus anticoagulant and  Raynaud's phenomena.  Positive lupus anticoagulant-he is on aspirin and Xarelto.  Raynaud's syndrome without gangrene-patient states his Raynaud's phenomena used to be very active but is not recently.  He denies any history of digital ulcers.  ILD (interstitial lung disease) ([place]) - And pulmonary fibrosis.  He has been followed by Dr. Vaughan Browner.  High-resolution CT showed UIP versus NSIP.  I called Dr. Vaughan Browner today to discuss patient's situation.  Dr. Kimber Relic stated that patient has aggressive with ILD and will benefit from immunosuppressive therapy.  We discussed possible use of Imuran.  He was in agreement.  I had detailed discussion with the patient.  Indications side effects contraindications were discussed at length.  A handout on Imuran was given.  Informed consent was obtained.  I will obtain labs to start him on immunosuppressive therapy.  TPMT will be obtained as well.  If the labs are normal then we can start him on Imuran 50 mg p.o. daily.  Labs will be checked in 2 weeks.  If labs are normal then we can increase Imuran 100 mg p.o. daily.  Labs will be again checked in 2 weeks, 2 months and then every 3 months if stable.  Patient will follow up with Dr. Vaughan Browner closely to see response to therapy.  Other medical problems are listed as follows:  Paroxysmal atrial fibrillation ([facility])  Atherosclerosis of aorta ([facility])  History of hyperlipidemia  Claudication in peripheral vascular disease ([place])  Acquired thrombophilia ([place])  History of COPD  Hx of colonic polyps  Vitamin D deficiency  Anxiety and depression  Orders: Orders Placed This Encounter  Procedures  . Hepatitis B core antibody, IgM  . Hepatitis B surface antigen  . Hepatitis C antibody  . HIV Antibody (routine testing w rflx)  . QuantiFERON-TB Gold Plus  . Serum protein electrophoresis with reflex  . IgG, IgA, IgM  . Thiopurine methyltransferase(tpmt)rbc   No orders of the defined types were placed in this  encounter.   Face-to-face time spent with patient was 30 minutes. Greater than 50% of time was spent in counseling and coordination of care.  Follow-Up Instructions: Return in about 3 months (around 02/24/2020) for Scleroderma, ILD, positive lupus anticoagulant.   Bo Merino, MD  Note - This record has been created using Editor, commissioning.  Chart creation errors have been sought, but may not always  have been located. Such creation errors do not reflect on  the standard of medical care.

## 2019-11-24 NOTE — Assessment & Plan Note (Signed)
Plan: Emphasized need to stop smoking Continue Trelegy Ellipta Walk today in office We will repeat pulmonary function test spirometry with DLCO in September/2021

## 2019-11-25 NOTE — Telephone Encounter (Signed)
Error

## 2019-11-29 NOTE — Progress Notes (Signed)
I called patient and discussed results with him.  HIV RNA qualitative is negative.  TB gold is positive.  TPMT is a still pending.  He will need treatment of latent tuberculosis prior to starting Imuran.  Can you please schedule an appointment for this patient to start him on treatment? Thank you

## 2019-11-30 ENCOUNTER — Telehealth: Payer: Self-pay | Admitting: *Deleted

## 2019-11-30 NOTE — Telephone Encounter (Signed)
Joycelyn Schmid, can you please reach out to the patient and schedule an appointment for LTBI?  Will need a 30 minute, new patient slot. Thanks! Sharyn Lull

## 2019-11-30 NOTE — Telephone Encounter (Signed)
----- Message from Truman Hayward, MD sent at 11/29/2019  5:21 PM EDT ----- Regarding: RE: + HIV EIA vs p24 We can def get him rx for LTB at [facility] ----- Message ----- From: Bo Merino, MD Sent: 11/29/2019   4:21 PM EDT To: Truman Hayward, MD, Marshell Garfinkel, MD Subject: RE: + HIV EIA vs p24                           Dr. Tommy Medal,  I reviewed his results and discussed findings with the patient today.  He states he has had positive PPD in the past several years ago.  He has never been treated for latent tuberculosis.  I would really appreciate if you could schedule him for treatment of latent tuberculosis.  As he will need treatment prior to starting on Imuran or any other immunosuppressive agent.  Thank you,  Abel Presto ----- Message ----- From: Tommy Medal, Lavell Islam, MD Sent: 11/29/2019  11:04 AM EDT To: Bo Merino, MD, Landis Gandy, RN Subject: RE: + HIV EIA vs p24                           That helps out a lot! Dr Estanislado Pandy do you want [place] to see pt for his QF gold + (assumign this is new) ----- Message ----- From: Landis Gandy, RN Sent: 11/29/2019  10:35 AM EDT To: Truman Hayward, MD Subject: RE: + HIV EIA vs p24                           Do these results help? Looks like they were final this morning at 9:06:   Ref Range & Units 5 d ago  HIV-1 RNA, Qualitative, TMA    Not Detect Not Detected   Comment: HIV-1 RNA is not detected. No laboratory evidence of  HIV infection. The HIV-1 RNA, Qualitative Real-Time PCR  assay is recommended for use as part of a multi-test  HIV-1/HIV-2 screening and diagnostic algorithm. This  assay can also be used to resolve indeterminate HIV-1  antibody assay results, and to test for HIV-1 infection  in patients less than 66 years old. When a 4th  generation HIV multi-test screening and diagnostic  algorithm is used: If the test results include a  repeatedly reactive HIV-1/2 Antigen/Antibody (4th  generation) screen,  followed by negative confirmatory  tests for HIV-1 and 2 antibodies and HIV-1 RNA by PCR,  the most likely interpretation is a non-specific  ("biological false positive") reaction in the 4th  generation screening assay. There is no current  laboratory evidence of HIV infection. Repeat testing  on a second specimen is not generally indicated, but  may be appropriate if there are known risk factor for  recent HIV exposure.   Resulting Agency  [facility]  Specimen Collected: 11/24/19 15:27 Last Resulted: 11/29/19 09:06   ----- Message ----- From: Tommy Medal, Lavell Islam, MD Sent: 11/26/2019   1:22 PM EDT To: Bo Merino, MD, Landis Gandy, RN Subject: + HIV EIA vs p24                               I can see that patient has + HIV on EIA/p24 ag but "discrim ab negative which means this is either acute HIV w no  ab (p24 +) or a false + They test SHOULD have qual RNA but if it has not been done patient would need HIV quant RNA to distinguish. Is patient someone that you think is sig risk for having HIV?

## 2019-12-01 LAB — PROTEIN ELECTROPHORESIS, SERUM, WITH REFLEX
Albumin ELP: 3.9 g/dL (ref 3.8–4.8)
Alpha 1: 0.4 g/dL — ABNORMAL HIGH (ref 0.2–0.3)
Alpha 2: 0.9 g/dL (ref 0.5–0.9)
Beta 2: 0.3 g/dL (ref 0.2–0.5)
Beta Globulin: 0.4 g/dL (ref 0.4–0.6)
Gamma Globulin: 0.9 g/dL (ref 0.8–1.7)
Total Protein: 6.7 g/dL (ref 6.1–8.1)

## 2019-12-01 LAB — HEPATITIS C ANTIBODY
Hepatitis C Ab: NONREACTIVE
SIGNAL TO CUT-OFF: 0.01 (ref <1.00)

## 2019-12-01 LAB — QUANTIFERON-TB GOLD PLUS
Mitogen-NIL: >10 IU/mL
NIL: 0.01 IU/mL
QuantiFERON-TB Gold Plus: POSITIVE — AB
TB1-NIL: 1.55 IU/mL
TB2-NIL: 1.4 IU/mL

## 2019-12-01 LAB — HIV 1 RNA, QL RT PCR: HIV-1 RNA, Qualitative, TMA: NOT DETECTED

## 2019-12-01 LAB — HEPATITIS B CORE ANTIBODY, IGM: Hep B C IgM: NONREACTIVE

## 2019-12-01 LAB — IGG, IGA, IGM
IgG (Immunoglobin G), Serum: 943 mg/dL (ref 600–1540)
IgM, Serum: 60 mg/dL (ref 50–300)
Immunoglobulin A: 153 mg/dL (ref 70–320)

## 2019-12-01 LAB — HIV ANTIBODY (ROUTINE TESTING W REFLEX): HIV 1&2 Ab, 4th Generation: REACTIVE — AB

## 2019-12-01 LAB — HIV-1/2 AB - DIFFERENTIATION
HIV-1 antibody: NEGATIVE
HIV-2 Ab: NEGATIVE

## 2019-12-01 LAB — THIOPURINE METHYLTRANSFERASE (TPMT), RBC: Thiopurine Methyltransferase, RBC: 12 nmol/hr/mL RBC — ABNORMAL LOW

## 2019-12-01 LAB — HEPATITIS B SURFACE ANTIGEN: Hepatitis B Surface Ag: NONREACTIVE

## 2019-12-01 NOTE — Progress Notes (Signed)
Low metabolizer for  TPMT.  He should be able to tolerate lower dose of Imuran when you decide to place him on it.

## 2019-12-13 ENCOUNTER — Ambulatory Visit: Payer: PPO | Admitting: Adult Health

## 2019-12-14 ENCOUNTER — Ambulatory Visit: Payer: PPO | Admitting: Rheumatology

## 2019-12-19 ENCOUNTER — Other Ambulatory Visit: Payer: Self-pay

## 2019-12-19 ENCOUNTER — Ambulatory Visit (HOSPITAL_BASED_OUTPATIENT_CLINIC_OR_DEPARTMENT_OTHER): Payer: PPO | Admitting: Infectious Disease

## 2019-12-19 ENCOUNTER — Ambulatory Visit
Admission: RE | Admit: 2019-12-19 | Discharge: 2019-12-19 | Disposition: A | Discharge status: Home / Self Care | Payer: PPO | Source: Ambulatory Visit | Attending: Infectious Disease | Admitting: Infectious Disease

## 2019-12-19 ENCOUNTER — Encounter: Payer: Self-pay | Admitting: Infectious Disease

## 2019-12-19 VITALS — BP 148/83 | HR 87 | Temp 98.3°F | Wt 184.6 lb

## 2019-12-19 DIAGNOSIS — N320 Bladder-neck obstruction: Secondary | ICD-10-CM | POA: Diagnosis not present

## 2019-12-19 DIAGNOSIS — Z227 Latent tuberculosis: Secondary | ICD-10-CM

## 2019-12-19 DIAGNOSIS — I7300 Raynaud's syndrome without gangrene: Secondary | ICD-10-CM

## 2019-12-19 DIAGNOSIS — J849 Interstitial pulmonary disease, unspecified: Secondary | ICD-10-CM | POA: Diagnosis not present

## 2019-12-19 DIAGNOSIS — R7611 Nonspecific reaction to tuberculin skin test without active tuberculosis: Secondary | ICD-10-CM | POA: Diagnosis not present

## 2019-12-19 DIAGNOSIS — M349 Systemic sclerosis, unspecified: Secondary | ICD-10-CM | POA: Diagnosis not present

## 2019-12-19 DIAGNOSIS — R768 Other specified abnormal immunological findings in serum: Secondary | ICD-10-CM | POA: Diagnosis not present

## 2019-12-19 DIAGNOSIS — I739 Peripheral vascular disease, unspecified: Secondary | ICD-10-CM

## 2019-12-19 DIAGNOSIS — I700 Atherosclerosis of aorta: Secondary | ICD-10-CM | POA: Diagnosis not present

## 2019-12-19 DIAGNOSIS — I480 Paroxysmal atrial fibrillation: Secondary | ICD-10-CM | POA: Diagnosis not present

## 2019-12-19 DIAGNOSIS — J8410 Pulmonary fibrosis, unspecified: Secondary | ICD-10-CM | POA: Diagnosis not present

## 2019-12-19 HISTORY — DX: Latent tuberculosis: Z22.7

## 2019-12-19 MED ORDER — ISONIAZID 300 MG PO TABS: 300.0000 mg | ORAL_TABLET | Freq: Every day | ORAL | 8 refills | Status: DC

## 2019-12-19 NOTE — Progress Notes (Signed)
Subjective:   Reason for infectious disease consult: Latent tuberculosis  Requesting physician: Dr. Gypsy Lore   Patient ID: Bradley Ferguson, male    DOB: 1951/06/15, 69 y.o.   MRN: [id number]  HPI   Bradley Ferguson is a 69 year old Caucasian man with a history significant for COPD, smoking peripheral vascular disease and interstitial lung disease thought to be due to systemic sclerosis.  He tested positive for QuantiFERON gold consistent with his prior positive TB skin test 25 years ago.  When asked about why he was tested for tuberculosis he said it was just done as part of routine medical care.  He had a chest x-ray at the time but was not treated for latent tuberculosis.  He has had no change in his baseline cough associated with COPD and recent imaging including CT scan of the chest does not show evidence of pulmonary tuberculosis.  He has had some weight gain during the COVID-19 pandemic with some weight loss as well but it does not appear to be unintentional.  He is concerned about the immunosuppressive effects of azathioprine was going to be prescribed once his latent TB has been effectively treated  He did also test positive on initial screening for HIV but this returned out to be a false positive Bradley Ferguson.  He is not been sexually active for years and does not appear to have much in the way of risk factors for acquiring HIV  Past Medical History:  Diagnosis Date  . Anxiety   . Atrial fibrillation ([place])   . Blood transfusion without reported diagnosis   . Cataract   . Depression   . Emphysema of lung ([place])   . Heart murmur   . Hyperlipidemia   . PVD (peripheral vascular disease) ([place])   . Raynaud's syndrome   . TB lung, latent 12/19/2019    Past Surgical History:  Procedure Laterality Date  . ATRIAL FIBRILLATION ABLATION    . CATARACT EXTRACTION Right   . FRACTURE SURGERY    . LOWER EXTREMITY INTERVENTION N/A 04/27/2017   Procedure: LOWER EXTREMITY INTERVENTION;  Surgeon:  Lorretta Harp, MD;  Location: [facility];  Service: Cardiovascular;  Laterality: N/A;  . PERIPHERAL VASCULAR ATHERECTOMY Right 04/27/2017   Procedure: PERIPHERAL VASCULAR ATHERECTOMY;  Surgeon: Lorretta Harp, MD;  Location: [facility];  Service: Cardiovascular;  Laterality: Right;  Iliac  . PERIPHERAL VASCULAR INTERVENTION Right 04/27/2017   Procedure: PERIPHERAL VASCULAR INTERVENTION;  Surgeon: Lorretta Harp, MD;  Location: [facility];  Service: Cardiovascular;  Laterality: Right;  Iliac    Family History  Problem Relation Age of Onset  . Cancer Mother        small cell lung cancer  . Cancer Father        unsure type  . Colon cancer Neg Hx   . Rectal cancer Neg Hx       Social History   Socioeconomic History  . Marital status: Legally Separated    Spouse name: Not on file  . Number of children: 1  . Years of education: Not on file  . Highest education level: Not on file  Occupational History  . 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 Smoking status: Current Every Day Smoker    Packs/day: 0.75    Years: 50.00    Pack years: 37.50    Types: Cigarettes    Start date: 71  . Smokeless tobacco: Never Used  . Tobacco comment: 11/24/19-3/4 of a  pack a day  Vaping Use  . Vaping Use: Never used  Substance and Sexual Activity  . Alcohol use: Yes    Alcohol/week: 4.0 standard drinks    Types: 4 Standard drinks or equivalent per week    Comment: 3-4 drinks per week  . Drug use: Never  . Sexual activity: Not on file  Other Topics Concern  . Not on file  Social History Narrative  . Not on file   Social Determinants of Health   Financial Resource Strain:   . Difficulty of Paying Living Expenses:   Food Insecurity:   . Worried About Charity fundraiser in the Last Year:   . Arboriculturist in the Last Year:   Transportation Needs:   . Film/video editor (Medical):   Marland Kitchen Lack of Transportation (Non-Medical):   Physical  Activity:   . Days of Exercise per Week:   . Minutes of Exercise per Session:   Stress:   . Feeling of Stress :   Social Connections:   . Frequency of Communication with Friends and Family:   . Frequency of Social Gatherings with Friends and Family:   . Attends Religious Services:   . Active Member of Clubs or Organizations:   . Attends Archivist Meetings:   Marland Kitchen Marital Status:     Allergies  Allergen Reactions  . Effexor [Venlafaxine]     Feels bad  . Wellbutrin [Bupropion]     sleepy  . Zoloft [Sertraline Hcl]     Felt bad     Current Outpatient Medications:  .  ALPRAZolam (XANAX) 1 MG tablet, Take 1/2-1 tablet 2 - 3 x /day ONLY if needed for Anxiety Attack &  limit to 5 days /week to avoid addiction, Disp: 90 tablet, Rfl: 0 .  aspirin EC 81 MG tablet, Take 81 mg by mouth daily., Disp: , Rfl:  .  atorvastatin (LIPITOR) 80 MG tablet, Take 1 tablet Daily for Cholesterol, Disp: 90 tablet, Rfl: 1 .  CHOLECALCIFEROL PO, Take 10,000 Units by mouth daily., Disp: , Rfl:  .  dofetilide (TIKOSYN) 500 MCG capsule, TAKE ONE CAPSULE BY MOUTH TWO TIMES DAILY, Disp: 60 capsule, Rfl: 5 .  Fluticasone-Umeclidin-Vilant (TRELEGY ELLIPTA) 100-62.5-25 MCG/INH AEPB, Inhale 1 puff into the lungs daily., Disp: 180 each, Rfl: 3 .  ketoconazole (NIZORAL) 2 % cream, Apply to rash 1 to 2 x /day, Disp: 60 g, Rfl: 1 .  rivaroxaban (XARELTO) 20 MG TABS tablet, Take 1 tablet Daily to Prevent Blood Clots, Disp: 90 tablet, Rfl: 1 .  triamcinolone cream (KENALOG) 0.5 %, Apply 1 application topically 2 (two) times daily., Disp: 80 g, Rfl: 1   Review of Systems  Constitutional: Negative for activity change, appetite change, chills, diaphoresis, fatigue, fever and unexpected weight change.  HENT: Negative for congestion, rhinorrhea, sinus pressure, sneezing, sore throat and trouble swallowing.   Eyes: Negative for photophobia and visual disturbance.  Respiratory: Positive for cough. Negative for chest  tightness, shortness of breath, wheezing and stridor.   Cardiovascular: Negative for chest pain, palpitations and leg swelling.  Gastrointestinal: Negative for abdominal distention, abdominal pain, anal bleeding, blood in stool, constipation, diarrhea, nausea and vomiting.  Genitourinary: Negative for difficulty urinating, dysuria, flank pain and hematuria.  Musculoskeletal: Negative for arthralgias, back pain, gait problem, joint swelling and myalgias.  Skin: Negative for color change, pallor, rash and wound.  Neurological: Negative for dizziness, tremors, weakness and light-headedness.  Hematological: Negative for adenopathy. Does not bruise/bleed easily.  Psychiatric/Behavioral: Negative for agitation, behavioral problems, confusion, decreased concentration, dysphoric mood and sleep disturbance.       Objective:   Physical Exam Constitutional:      General: He is not in acute distress.    Appearance: Normal appearance. He is well-developed. He is not ill-appearing or diaphoretic.  HENT:     Head: Normocephalic and atraumatic.     Right Ear: Hearing and external ear normal.     Left Ear: Hearing and external ear normal.     Nose: No nasal deformity or rhinorrhea.  Eyes:     General: No scleral icterus.    Conjunctiva/sclera: Conjunctivae normal.     Right eye: Right conjunctiva is not injected.     Left eye: Left conjunctiva is not injected.     Pupils: Pupils are equal, round, and reactive to light.  Neck:     Vascular: No JVD.  Cardiovascular:     Rate and Rhythm: Normal rate and regular rhythm.     Heart sounds: Normal heart sounds, S1 normal and S2 normal. No murmur heard.  No friction rub.  Pulmonary:     Effort: Pulmonary effort is normal. No respiratory distress.     Breath sounds: Normal breath sounds. No stridor. No wheezing or rales.  Abdominal:     General: Bowel sounds are normal. There is no distension.     Palpations: Abdomen is soft.     Tenderness: There is  no abdominal tenderness.  Musculoskeletal:        General: Normal range of motion.     Right shoulder: Normal.     Left shoulder: Normal.     Cervical back: Normal range of motion and neck supple.     Right hip: Normal.     Left hip: Normal.     Right knee: Normal.     Left knee: Normal.  Lymphadenopathy:     Head:     Right side of head: No submandibular, preauricular or posterior auricular adenopathy.     Left side of head: No submandibular, preauricular or posterior auricular adenopathy.     Cervical: No cervical adenopathy.     Right cervical: No superficial or deep cervical adenopathy.    Left cervical: No superficial or deep cervical adenopathy.  Skin:    General: Skin is warm and dry.     Coloration: Skin is not pale.     Findings: No abrasion, bruising, ecchymosis, erythema, lesion or rash.     Nails: There is no clubbing.  Neurological:     Mental Status: He is alert and oriented to person, place, and time.     Sensory: No sensory deficit.     Coordination: Coordination normal.     Gait: Gait normal.  Psychiatric:        Attention and Perception: He is attentive.        Mood and Affect: Mood normal.        Speech: Speech normal.        Behavior: Behavior normal. Behavior is cooperative.        Thought Content: Thought content normal.        Judgment: Judgment normal.           Assessment & Plan:  Latent TB: I have ordered a chest x-ray.  As long as there is nothing concerning on imaging he should start treatment with isoniazid and I have given him a prescription for this I think this will be the most optimal drug  given his multiple medications and the risk for drug drug interactions in particular with his antiarrhythmics and anticoagulatnts  He was concerned about cost so I printed a prescription prescription for him that he can either take to the health department where he can get the medication for free or he can see if it is cheaper at a particular  pharmacy.  I plan on seeing him back in roughly 5 months time.  Systemic sclerosis: He will initiate immunosuppressive therapy.  Is going to take 9 months to get him through treatment for latent tuberculosis.  I would feel comfortable with him being initiated on him suppressive therapy after he has been on treatment for latent TB for at least 2 months.

## 2019-12-20 ENCOUNTER — Other Ambulatory Visit: Payer: Self-pay | Admitting: *Deleted

## 2019-12-20 DIAGNOSIS — E782 Mixed hyperlipidemia: Secondary | ICD-10-CM

## 2019-12-20 DIAGNOSIS — I480 Paroxysmal atrial fibrillation: Secondary | ICD-10-CM

## 2019-12-20 MED ORDER — RIVAROXABAN 20 MG PO TABS: ORAL_TABLET | ORAL | 1 refills | Status: DC

## 2019-12-20 MED ORDER — ATORVASTATIN CALCIUM 80 MG PO TABS: ORAL_TABLET | ORAL | 1 refills | Status: DC

## 2019-12-21 ENCOUNTER — Other Ambulatory Visit (HOSPITAL_COMMUNITY): Payer: Self-pay | Admitting: Cardiovascular Disease

## 2019-12-21 ENCOUNTER — Ambulatory Visit (HOSPITAL_BASED_OUTPATIENT_CLINIC_OR_DEPARTMENT_OTHER)
Admission: RE | Admit: 2019-12-21 | Discharge: 2019-12-21 | Disposition: A | Discharge status: Home / Self Care | Payer: PPO | Source: Ambulatory Visit | Attending: Cardiovascular Disease | Admitting: Cardiovascular Disease

## 2019-12-21 ENCOUNTER — Ambulatory Visit (HOSPITAL_COMMUNITY)
Admission: RE | Admit: 2019-12-21 | Discharge: 2019-12-21 | Disposition: A | Discharge status: Home / Self Care | Payer: PPO | Source: Ambulatory Visit | Attending: Cardiology | Admitting: Cardiology

## 2019-12-21 ENCOUNTER — Other Ambulatory Visit: Payer: Self-pay

## 2019-12-21 DIAGNOSIS — I739 Peripheral vascular disease, unspecified: Secondary | ICD-10-CM | POA: Diagnosis not present

## 2019-12-21 DIAGNOSIS — Z95828 Presence of other vascular implants and grafts: Secondary | ICD-10-CM

## 2019-12-30 NOTE — Progress Notes (Deleted)
MEDICARE ANNUAL WELLNESS VISIT AND FOLLOW UP Assessment:    Encounter for Medicare annual wellness exam  Labile hypertension Continue medication Monitor blood pressure at home; call if consistently over 130/80 Continue DASH diet.   Reminder to go to the ER if any CP, SOB, nausea, dizziness, severe HA, changes vision/speech, left arm numbness and tingling and jaw pain.  Paroxysmal atrial fibrillation (HCC) Rate controlled today, doing well on tikosyn, xarelto  Has followed up with cardiology for tikosyn levels Followed by Dr. [name]   Raynaud's disease without gangrene Improved recently, continue to monitor   BPH Recently well controlled, denies symptoms  Vitamin D deficiency Continue supplementation- taking 10000 IU daily  Check vitamin D level -     VITAMIN D 25 Hydroxy (Vit-D Deficiency, Fractures)  TOBACCO ABUSE Discussed risks associated with tobacco use and advised to reduce or quit Patient is ready to do so and plans to slow taper Declines Chantix or other medication Will follow up at the next visit -lung cancer screening with low dose CT discussed as recommended by guidelines based on age, number of pack year history.  Discussed risks of screening including but not limited to false positives on xray, further testing or consultation with specialist, and possible false negative CT as well. Understanding expressed and wishes to proceed with CT testing. Order placed.    Prediabetes Discussed disease and risks Discussed diet/exercise, weight management  Reminded to schedule appointment with ophthalmology -     Hemoglobin A1c  Hyperlipidemia, mixed Continue medications: newly on atorvastatin 80 mg, ? Arthralgias, will trial off for 2 weeks and call back, consider switch to rosuvastatin Continue low cholesterol diet and exercise.  Check lipid panel.  -     Lipid panel -     TSH  Claudication in peripheral vascular disease (HCC) Followed by Dr. [name], s/p stent,  continued claudication Continue plavix, cholesterol Recommended to quit smoking  Bipolar I disorder ([place]) Currently stable off of medications Lifestyle discussed: diet/exerise, sleep hygiene, stress management, hydration  Attention deficit disorder, unspecified hyperactivity presence Currently stable, doing well at current job off of medications  ANXIETY DEPRESSION Well managed by current regimen; continue medications- using benzo sparingly Stress management techniques discussed, increase water, good sleep hygiene discussed, increase exercise, and increase veggies.   BMI 26.0-26.9,adult Long discussion about weight loss, diet, and exercise Recommended diet heavy in fruits and veggies and low in animal meats, cheeses, and dairy products, appropriate calorie intake Discussed appropriate weight for height Follow up at next visit  Medication management -     CBC with Differential/Platelet -     COMPLETE METABOLIC PANEL WITH GFR -     Magnesium  Colon polyps Recommended 6 month follow up colonoscopy due to numerous polyps, due 12/2018, phone number given  Over 30 minutes of exam, counseling, chart review, and critical decision making was performed  Future Appointments  Date Time Provider [place]  01/03/2020 11:15 AM Vicie Mutters, PA-C [facility] None  02/25/2020 12:00 PM [facility]-SCREENING [facility] None  02/28/2020 11:00 AM Bo Merino, MD [facility] None  02/29/2020 11:00 AM [facility]PFT RM [facility] None  02/29/2020 12:00 PM Marshell Garfinkel, MD [facility] None  04/23/2020 11:00 AM Tommy Medal, Lavell Islam, MD [facility]  04/30/2020  2:00 PM [name], MD [facility] None     Plan:   During the course of the visit the patient was educated and counseled about appropriate screening and preventive services including:    Pneumococcal vaccine   Influenza vaccine  Prevnar  13  Td vaccine  Screening electrocardiogram  Colorectal cancer screening  Diabetes  screening  Glaucoma screening  Nutrition counseling    Subjective:  Bradley Ferguson is a 69 y.o. male who presents for Medicare Annual Wellness Visit and 3 month follow up for HTN, hyperlipidemia, prediabetes, and vitamin D Def.   Hx/o pAfib s/p ablation in 2011, on Tikosyn with successful conversion in 2019. Follows with Dr. Rayann Heman.   Also Dr. [name] for PVD with claudification, peripheral angiography on  04/27/17 revealing a 99% calcified eccentric proximal right common iliac artery stenosis and bilateral long segment SFA chronic total occlusions which were highly calcified. Pt underwent direct back orbital rotational atherectomy, PTCA and cover stenting using a 7 mm x 39 mm long VBX covered stent.  He does have hx of mixed bipolar type depression with ADD and anxiety though in recently years has been apparently stable on xanax, recently taking rarely, averages 1/2 tab 2-3 times a week. He reports doing well with current regimen.   he currently continues to smoke 0.75 pack a day; discussed risks associated with smoking, patient is not ready to quit, but slowly tapering down.  He had low dose screen CT 12/2018 that showed pulmonary nodules that needs follow up next month but also showed ILD.  He saw Dr. Vaughan Browner and underwent PFTS and high contrast CT in Jan 2021 showed possible UIP versus NSIP pattern.  He was also found to have an elevated ANA with symptoms significant for connective tissue so was referred to Dr. Patrecia Pour.  There he was found to have lupus anticoagulant, on ASA/xarelto and diagnosed with limited systemic sclerosis was suppose to be started on imuran however had a positive TB gold and saw Dr. Tommy Medal with ID and has been started on  isoniazid for latent TB.   1. Spectrum of findings compatible with basilar predominant fibrotic interstitial lung disease without frank honeycombing and without appreciable progression since 01/06/2019. Findings are categorized as probable UIP per  consensus guidelines: Diagnosis of Idiopathic Pulmonary Fibrosis: An Official ATS/ERS/JRS/ALAT Clinical Practice Guideline. [place], Iss 5, [id number], Feb 28 2017. 2. No significant interval change in tiny pulmonary nodules measuring up to 4 mm. Patient is due for follow-up screening chest CT in July 2021. 3. Three-vessel coronary atherosclerosis.  He had first colonoscopy 06/2018 with numerous adenomatous polyps of colon ~20, has been recommended a 6 month follow up colonoscopy by Dr. Henrene Pastor.  BMI is There is no height or weight on file to calculate BMI., he has been working on diet, exercise limited by cardiovascular disease (PAD/claudication).  Wt Readings from Last 3 Encounters:  12/19/19 184 lb 9.6 oz (83.7 kg)  11/24/19 187 lb 12.8 oz (85.2 kg)  11/24/19 185 lb 6.4 oz (84.1 kg)   His blood pressure has been controlled at home, today their BP is   He does not workout. He denies chest pain, shortness of breath, dizziness.   He is on cholesterol medication (atorvastatin 80 mg daily) and denies myalgias. His cholesterol is at goal. The cholesterol last visit was:   Lab Results  Component Value Date   CHOL 129 09/29/2019   HDL 43 09/29/2019   LDLCALC 63 09/29/2019   TRIG 146 09/29/2019   CHOLHDL 3.0 09/29/2019   He has not been working on diet and exercise for prediabetes, and denies increased appetite, nausea, polydipsia, polyuria, visual disturbances, vomiting and weight loss. Last A1C in the office was:  Lab Results  Component Value Date   HGBA1C 5.7 (H) 09/29/2019   Last GFR Lab Results  Component Value Date   GFRNONAA 80 09/29/2019   Patient is on Vitamin D supplement, taking 10000 IU daily    Lab Results  Component Value Date   VD25OH 52 09/29/2019      Medication Review:   Current Outpatient Medications (Cardiovascular):  .  atorvastatin (LIPITOR) 80 MG tablet, Take 1 tablet Daily for Cholesterol .  dofetilide (TIKOSYN) 500 MCG capsule,  TAKE ONE CAPSULE BY MOUTH TWO TIMES DAILY  Current Outpatient Medications (Respiratory):  Marland Kitchen  Fluticasone-Umeclidin-Vilant (TRELEGY ELLIPTA) 100-62.5-25 MCG/INH AEPB, Inhale 1 puff into the lungs daily.  Current Outpatient Medications (Analgesics):  .  aspirin EC 81 MG tablet, Take 81 mg by mouth daily.  Current Outpatient Medications (Hematological):  .  rivaroxaban (XARELTO) 20 MG TABS tablet, Take 1 tablet Daily to Prevent Blood Clots  Current Outpatient Medications (Other):  Marland Kitchen  ALPRAZolam (XANAX) 1 MG tablet, Take 1/2-1 tablet 2 - 3 x /day ONLY if needed for Anxiety Attack &  limit to 5 days /week to avoid addiction .  CHOLECALCIFEROL PO, Take 10,000 Units by mouth daily. Marland Kitchen  isoniazid (NYDRAZID) 300 MG tablet, Take 1 tablet (300 mg total) by mouth daily. Marland Kitchen  ketoconazole (NIZORAL) 2 % cream, Apply to rash 1 to 2 x /day .  triamcinolone cream (KENALOG) 0.5 %, Apply 1 application topically 2 (two) times daily.  Allergies: Allergies  Allergen Reactions  . Effexor [Venlafaxine]     Feels bad  . Wellbutrin [Bupropion]     sleepy  . Zoloft [Sertraline Hcl]     Felt bad    Current Problems (verified) has ANXIETY DEPRESSION; TOBACCO ABUSE; RAYNAUDS SYNDROME; Attention deficit disorder; Dental caries; Bipolar I disorder ([place]); Atrial fibrillation ([place]); Hyperlipidemia, mixed; Vitamin D deficiency; Labile hypertension; Other abnormal glucose (prediabetes); BPH; Claudication in peripheral vascular disease ([place]); Visit for monitoring Tikosyn therapy; Colon polyps; Acquired thrombophilia ([place]); Atherosclerosis of aorta ([place]); Chronic obstructive pulmonary disease ([place]); Centrilobular emphysema ([place]); Interstitial pulmonary disease ([place]); Positive ANA (antinuclear antibody); TB lung, latent; and Systemic scleroses (HCC) on their problem list.  Screening Tests Immunization History  Administered Date(s) Administered  . Influenza Whole 04/30/2010  . Influenza, High Dose Seasonal PF 03/20/2017,  03/29/2018, 04/06/2019  . PFIZER SARS-COV-2 Vaccination 08/04/2019, 08/29/2019  . Pneumococcal Conjugate-13 02/05/2017  . Pneumococcal Polysaccharide-23 04/30/2010, 03/29/2018  . Td 06/30/2013    Preventative care: Last colonoscopy: 06/2018, multiple polyps, follow up 6 months, he will call Dr. Henrene Pastor  CXR: 2011, getting every 2 years through work, reports requested  Prior vaccinations: TD or Tdap: 2015  Influenza: 02/2018  Pneumococcal: 2019 Prevnar13: 2018 Shingles/Zostavax: declines   Names of Other Physician/Practitioners you currently use: 1. [facility] here for primary care 2. Dr. Katy Fitch, eye doctor, last visit years ago, needs to schedule follow up for L cataract 3. Full dentures   Patient Care Team: [name], MD as PCP - General (Internal Medicine) Constance Haw, MD as PCP - Electrophysiology (Cardiology) Lorretta Harp, MD as Consulting Physician (Cardiology) Irene Shipper, MD as Consulting Physician (Gastroenterology) Warden Fillers, MD as Consulting Physician (Ophthalmology)  Surgical: He  has a past surgical history that includes Fracture surgery; Atrial fibrillation ablation; LOWER EXTREMITY INTERVENTION (N/A, 04/27/2017); PERIPHERAL VASCULAR INTERVENTION (Right, 04/27/2017); PERIPHERAL VASCULAR ATHERECTOMY (Right, 04/27/2017); and Cataract extraction (Right). Family His family history includes Cancer in his father and mother. Social history  He reports that  he has been smoking cigarettes. He started smoking about 50 years ago. He has a 37.50 pack-year smoking history. He has never used smokeless tobacco. He reports current alcohol use of about 4.0 standard drinks of alcohol per week. He reports that he does not use drugs.  MEDICARE WELLNESS OBJECTIVES: Physical activity:   Cardiac risk factors:   Depression/mood screen:   Depression screen [facility] 2/9 12/19/2019  Decreased Interest 1  Down, Depressed,  Hopeless 1  PHQ - 2 Score 2  Altered sleeping 2  Tired, decreased energy 3  Change in appetite 3  Feeling bad or failure about yourself  2  Trouble concentrating 1  Moving slowly or fidgety/restless 3  Suicidal thoughts 0  PHQ-9 Score 16  Difficult doing work/chores -    ADLs:  In your present state of health, do you have any difficulty performing the following activities: 09/28/2019 03/27/2019  Hearing? N N  Vision? N N  Difficulty concentrating or making decisions? N N  Walking or climbing stairs? N N  Dressing or bathing? N N  Doing errands, shopping? N N  Some recent data might be hidden     Cognitive Testing  Alert? Yes  Normal Appearance?Yes  Oriented to person? Yes  Place? Yes   Time? Yes  Recall of three objects?  Yes  Can perform simple calculations? Yes  Displays appropriate judgment?Yes  Can read the correct time from a watch face?Yes  EOL planning:     Objective:   There were no vitals filed for this visit. There is no height or weight on file to calculate BMI.  General appearance: alert, no distress, WD/WN, male HEENT: normocephalic, sclerae anicteric, TMs pearly, nares patent, no discharge or erythema, pharynx normal Oral cavity: MMM, no lesions Neck: supple, no lymphadenopathy, no thyromegaly, no masses Heart: RRR, normal S1, S2, no murmurs Lungs: CTA bilaterally, mildly coarse over bronchioles anteriorly, no wheezes, rhonchi, or rales Abdomen: +bs, soft, non tender, non distended, no masses, no hepatomegaly, no splenomegaly Musculoskeletal: nontender, no swelling, no obvious deformity Extremities: no edema, no cyanosis, no clubbing Pulses: 2+ symmetric, upper extreamities, LE eripheral pulses thready and without edema. No aortic or femoral bruits. Neurological: alert, oriented x 3, CN2-12 intact, strength normal upper extremities and lower extremities, sensation normal throughout, DTRs 2+ throughout, no cerebellar signs, gait normal Psychiatric: normal  affect, behavior normal, pleasant   Medicare Attestation I have personally reviewed: The patient's medical and social history Their use of alcohol, tobacco or illicit drugs Their current medications and supplements The patient's functional ability including ADLs,fall risks, home safety risks, cognitive, and hearing and visual impairment Diet and physical activities Evidence for depression or mood disorders  The patient's weight, height, BMI, and visual acuity have been recorded in the chart.  I have made referrals, counseling, and provided education to the patient based on review of the above and I have provided the patient with a written personalized care plan for preventive services.     Vicie Mutters, PA-C   12/30/2019

## 2020-01-03 ENCOUNTER — Ambulatory Visit: Payer: PPO | Admitting: Physician Assistant

## 2020-01-09 ENCOUNTER — Telehealth: Payer: Self-pay | Admitting: Physician Assistant

## 2020-01-09 NOTE — Telephone Encounter (Signed)
Patient needs medicare wellness He had low dose screen CT 12/2018 that showed pulmonary nodules that needs follow up next month but also showed ILD.  He saw Dr. Vaughan Browner and underwent PFTS and high contrast CT in Jan 2021 showed possible UIP versus NSIP pattern.  He was also found to have an elevated ANA with symptoms significant for connective tissue so was referred to Dr. Patrecia Pour.  There he was found to have lupus anticoagulant, on ASA/xarelto and diagnosed with limited systemic sclerosis was suppose to be started on imuran however had a positive TB gold and saw Dr. Tommy Medal with ID and has been started on  isoniazid for latent TB.

## 2020-01-09 NOTE — Telephone Encounter (Signed)
Needs Medicare

## 2020-01-19 ENCOUNTER — Other Ambulatory Visit: Payer: Self-pay

## 2020-01-19 ENCOUNTER — Ambulatory Visit
Admission: RE | Admit: 2020-01-19 | Discharge: 2020-01-19 | Disposition: A | Discharge status: Home / Self Care | Payer: PPO | Source: Ambulatory Visit | Attending: Internal Medicine | Admitting: Internal Medicine

## 2020-01-19 DIAGNOSIS — Z87891 Personal history of nicotine dependence: Secondary | ICD-10-CM | POA: Diagnosis not present

## 2020-01-19 DIAGNOSIS — Z122 Encounter for screening for malignant neoplasm of respiratory organs: Secondary | ICD-10-CM

## 2020-01-20 NOTE — Progress Notes (Signed)
===========================================================  -   Chest CT scan - OK - No sign of Lung Cancer - - Recommend continue annual Chest CT scans for cancer surveillance ===========================================================

## 2020-01-24 ENCOUNTER — Telehealth: Payer: Self-pay | Admitting: Rheumatology

## 2020-01-24 NOTE — Telephone Encounter (Signed)
Patient states he would like to know about ILD and pulmonary fibrosis and starting Imuran. Patient would like to know what is ILD and what will happen if it is not treated. Patient would like to know how fast it progresses. Patient is concerned because he has to take treatment for latent TB. Please advise.

## 2020-01-24 NOTE — Telephone Encounter (Signed)
Patient called stating he has questions regarding his diagnosis and the medication that Dr. Estanislado Pandy is recommending.  Patient requested to speak with the nurse before scheduling an appointment.

## 2020-01-24 NOTE — Telephone Encounter (Signed)
I returned patient's call. Discussed that he has ILD which causes pulmonary fibrosis. I also explained the reason he needs immunosuppressive therapy. I advised him to schedule an appointment with Dr. Vaughan Browner if he has further questions regarding ILD. He has been fully vaccinated against COVID-19. Use of mask, good hand hygiene and social distancing was emphasized. I also advised in case he gets COVID-19 infection he will need COVID-19 antibody infusion.

## 2020-01-25 ENCOUNTER — Encounter (HOSPITAL_COMMUNITY): Payer: Self-pay

## 2020-01-26 NOTE — Progress Notes (Signed)
   History of Present Illness:      This very nice 69 y.o. [name] with HTN,  ASDCAD, ASPVD, HLD, Pre-Diabetes and Vitamin D Deficiency presents with c/o suspected RIH with a "bulge" In the Rt groin with straining.      Patient also has been seen recently by Dr Estanislado Pandy with a tentative Dx of "Limited Systemic Sclerosis" and with a hx/o  Remote (+) TBc skin test and recent current (+) TB Gold in anticipation of treatment with Imuran, patient was seen by Dr Flonnie Hailstone ([facility]) who recommended 9 months treatment with INH for Latent TBC (and recommended deferring initiation of initiation of Imuran til completion of at least 2 months on INH.)  Medications  .  atorvastatin  80 MG tablet, Take 1 tablet Daily for Cholesterol .  dofetilide (TIKOSYN) 500 MCG , TAKE ONE CAP 2 x  DAILY .  TRELEGY ELLIPTA 100-62.5-25, Inhale 1 puff into the lungs daily. Marland Kitchen  aspirin EC 81 MG tablet, Take 81 mg by mouth daily. Alveda Reasons 20 MG, Take 1 tablet Daily .  ALPRAZolam  1 MG t, Take 1/2-1 tablet 2 - 3 x /day ONLY if needed  .  CHOLECALCIFEROL , Take 10,000 Units daily. Marland Kitchen  isoniazid  300 MG tablet, Take 1 tablet  daily. Marland Kitchen  NIZORAL 2 % cream, Apply to rash 1 to 2 x /day .  triamcinolone cream 0.5 %, Apply  2  times daily.  Problem list He has ANXIETY DEPRESSION; TOBACCO ABUSE; RAYNAUDS SYNDROME; Attention deficit disorder; Dental caries; Bipolar I disorder ([place]); Atrial fibrillation ([place]); Hyperlipidemia, mixed; Vitamin D deficiency; Labile hypertension; Other abnormal glucose (prediabetes); BPH; Claudication in peripheral vascular disease ([place]); Visit for monitoring Tikosyn therapy; Colon polyps; Acquired thrombophilia ([place]); Atherosclerosis of aorta ([place]); Chronic obstructive pulmonary disease ([place]); Centrilobular emphysema ([place]); Interstitial pulmonary disease ([place]); Positive ANA (antinuclear antibody); TB lung, latent; and Systemic scleroses (HCC) on their problem list.   Observations/Objective:   BP (!) 124/62    Pulse 80   Temp (!) 97.4 F (36.3 C)   Resp 16   Ht 6' 1.5" (1.867 m)   Wt 182 lb (82.6 kg)   BMI 23.69 kg/m   HEENT - WNL. Neck - supple.  Chest - Clear equal BS. Cor - Nl HS. RRR w/o sig MGR. PP 1(+). No edema. GU- Obvious Rt inguinal bulge which gently reduces. MS- FROM w/o deformities.  Gait Nl. Neuro -  Nl w/o focal abnormalities. Assessment and Plan:  1. Inguinal hernia of right side without obstruction or gangrene  - Ambulatory referral to General Surgery  2. Labile hypertension  3. Chronic obstructive pulmonary disease, unspecified COPD type ([place])  Follow Up Instructions:        I discussed the assessment and treatment plan with the patient. The patient was provided an opportunity to ask questions and all were answered. The patient agreed with the plan and demonstrated an understanding of the instructions.       The patient was advised to call back or seek an in-person evaluation if the symptoms worsen or if the condition fails to improve as anticipated.   Kirtland Bouchard, MD

## 2020-01-27 ENCOUNTER — Other Ambulatory Visit: Payer: Self-pay

## 2020-01-27 ENCOUNTER — Ambulatory Visit (INDEPENDENT_AMBULATORY_CARE_PROVIDER_SITE_OTHER): Payer: PPO | Admitting: Internal Medicine

## 2020-01-27 VITALS — BP 124/62 | HR 80 | Temp 97.4°F | Resp 16 | Ht 73.5 in | Wt 182.0 lb

## 2020-01-27 DIAGNOSIS — R0989 Other specified symptoms and signs involving the circulatory and respiratory systems: Secondary | ICD-10-CM | POA: Diagnosis not present

## 2020-01-27 DIAGNOSIS — J449 Chronic obstructive pulmonary disease, unspecified: Secondary | ICD-10-CM | POA: Diagnosis not present

## 2020-01-27 DIAGNOSIS — K4090 Unilateral inguinal hernia, without obstruction or gangrene, not specified as recurrent: Secondary | ICD-10-CM

## 2020-01-28 ENCOUNTER — Encounter: Payer: Self-pay | Admitting: Internal Medicine

## 2020-02-13 ENCOUNTER — Other Ambulatory Visit: Payer: Self-pay | Admitting: Internal Medicine

## 2020-02-13 MED ORDER — DULOXETINE HCL 30 MG PO CPEP: ORAL_CAPSULE | ORAL | 0 refills | Status: DC

## 2020-02-16 NOTE — Progress Notes (Signed)
Office Visit Note  Patient: Bradley Ferguson             Date of Birth: 1951/04/06           MRN: [id number]             PCP: [name], MD Referring: [name], MD Visit Date: 02/28/2020 Occupation: @GUAROCC @  Subjective:  Discuss starting on Imuran   History of Present Illness: Bradley Ferguson is a 69 y.o. male with history of limited systemic sclerosis and ILD.  Patient presents today with several questions about the diagnosis of systemic sclerosis and ILD.  He states that he has not noticed much skin tightness or any recent rashes.  He has very infrequent symptoms of Raynaud's and has not noticed any digital ulcerations.  He states that he does continue to have a chronic cough and shortness of breath.  He has an upcoming appointment with Dr. Vaughan Browner tomorrow to further discuss ILD and update PFTs.  He reports that he had an appointment with Dr. Drucilla Schmidt on 12/19/2019 who recommended proceeding with Isoniazid for 9 months.  According to Dr. Arlyss Queen note he can initiate Imuran after 2 months of being on treatment for latent tuberculosis.  He has been apprehensive to start on Imuran during the COVID-19 pandemic.  He has received both COVID-19 vaccinations.    Activities of Daily Living:  Patient reports morning stiffness for 0  minutes.   Patient Denies nocturnal pain.  Difficulty dressing/grooming: Denies Difficulty climbing stairs: Denies Difficulty getting out of chair: Denies Difficulty using hands for taps, buttons, cutlery, and/or writing: Denies  Review of Systems  Constitutional: Positive for fatigue.  HENT: Negative for mouth sores, mouth dryness and nose dryness.   Eyes: Negative for itching and dryness.  Respiratory: Positive for cough and shortness of breath. Negative for difficulty breathing.   Cardiovascular: Negative for chest pain and palpitations.  Gastrointestinal: Negative for blood in stool, constipation and diarrhea.  Endocrine: Negative for increased  urination.  Genitourinary: Negative for difficulty urinating.  Musculoskeletal: Negative for arthralgias, joint pain, joint swelling, myalgias, morning stiffness, muscle tenderness and myalgias.  Skin: Negative for color change, rash and redness.  Allergic/Immunologic: Negative for susceptible to infections.  Neurological: Positive for numbness and weakness. Negative for dizziness, headaches and memory loss.  Hematological: Positive for bruising/bleeding tendency.  Psychiatric/Behavioral: Negative for confusion.    PMFS History:  Patient Active Problem List   Diagnosis Date Noted  . TB lung, latent 12/19/2019  . Systemic scleroses ([place]) 12/19/2019  . Centrilobular emphysema ([place]) 11/24/2019  . Interstitial pulmonary disease ([place]) 11/24/2019  . Positive ANA (antinuclear antibody) 11/24/2019  . Atherosclerosis of aorta ([place]) 06/30/2019  . Chronic obstructive pulmonary disease ([place]) 06/30/2019  . Acquired thrombophilia ([place]) 05/10/2019  . Colon polyps 12/06/2018  . Visit for monitoring Tikosyn therapy 04/27/2018  . Claudication in peripheral vascular disease ([place]) 04/16/2017  . Labile hypertension 02/29/2016  . Other abnormal glucose (prediabetes) 02/29/2016  . BPH 02/29/2016  . Hyperlipidemia, mixed 11/12/2015  . Vitamin D deficiency 11/12/2015  . Bipolar I disorder ([place]) 08/13/2015  . Atrial fibrillation ([place]) 08/13/2015  . Attention deficit disorder 08/08/2010  . Dental caries 08/08/2010  . ANXIETY DEPRESSION 06/06/2010  . TOBACCO ABUSE 06/06/2010  . RAYNAUDS SYNDROME 06/06/2010    Past Medical History:  Diagnosis Date  . Anxiety   . Atrial fibrillation ([place])   . Blood transfusion without reported diagnosis   . Cataract   . Depression   . Emphysema  of lung ([place])   . Heart murmur   . Hyperlipidemia   . PVD (peripheral vascular disease) ([place])   . Raynaud's syndrome   . TB lung, latent 12/19/2019    Family History  Problem Relation Age of Onset  . Cancer Mother         small cell lung cancer  . Cancer Father        unsure type  . Colon cancer Neg Hx   . Rectal cancer Neg Hx    Past Surgical History:  Procedure Laterality Date  . ATRIAL FIBRILLATION ABLATION    . CATARACT EXTRACTION Right   . FRACTURE SURGERY    . LOWER EXTREMITY INTERVENTION N/A 04/27/2017   Procedure: LOWER EXTREMITY INTERVENTION;  Surgeon: Lorretta Harp, MD;  Location: [facility];  Service: Cardiovascular;  Laterality: N/A;  . PERIPHERAL VASCULAR ATHERECTOMY Right 04/27/2017   Procedure: PERIPHERAL VASCULAR ATHERECTOMY;  Surgeon: Lorretta Harp, MD;  Location: [facility];  Service: Cardiovascular;  Laterality: Right;  Iliac  . PERIPHERAL VASCULAR INTERVENTION Right 04/27/2017   Procedure: PERIPHERAL VASCULAR INTERVENTION;  Surgeon: Lorretta Harp, MD;  Location: [facility];  Service: Cardiovascular;  Laterality: Right;  Iliac   Social History   Social History Narrative  . Not on file   Immunization History  Administered Date(s) Administered  . Influenza Whole 04/30/2010  . Influenza, High Dose Seasonal PF 03/20/2017, 03/29/2018, 04/06/2019  . PFIZER SARS-COV-2 Vaccination 08/04/2019, 08/29/2019  . Pneumococcal Conjugate-13 02/05/2017  . Pneumococcal Polysaccharide-23 04/30/2010, 03/29/2018  . Td 06/30/2013     Objective: Vital Signs: BP 129/77 (BP Location: Left Arm, Patient Position: Sitting, Cuff Size: Normal)   Pulse 87   Resp 16   Ht 6' 1.5" (1.867 m)   Wt 177 lb 9.6 oz (80.6 kg)   BMI 23.11 kg/m    Physical Exam Vitals and nursing note reviewed.  Constitutional:      Appearance: He is well-developed.  HENT:     Head: Normocephalic and atraumatic.  Eyes:     Conjunctiva/sclera: Conjunctivae normal.     Pupils: Pupils are equal, round, and reactive to light.  Pulmonary:     Effort: Pulmonary effort is normal.  Abdominal:     Palpations: Abdomen is soft.  Musculoskeletal:     Cervical back: Normal range of motion and  neck supple.  Skin:    General: Skin is warm and dry.     Capillary Refill: Capillary refill takes less than 2 seconds.     Comments: Skin tightness distal to MCPs.  Telangiectasias noted on bilateral palms.  Delayed capillary refill noted.  No digital ulcerations or signs of gangrene were noted.  Neurological:     Mental Status: He is alert and oriented to person, place, and time.  Psychiatric:        Behavior: Behavior normal.      Musculoskeletal Exam: C-spine limited range of motion with lateral rotation.  Thoracic kyphosis noted.  Shoulder joints have good range of motion with no discomfort.  Elbow joints and wrist joints have good range of motion with no tenderness or inflammation.  He has PIP and DIP thickening consistent with osteoarthritis of both hands.  Skin tightness distal to MCPs noted.  Hip joints have good range of motion with no discomfort.  Knee joints have good range of motion with mild warmth in the right knee.  Ankle joints have good range of motion with no tenderness or inflammation.  CDAI Exam: CDAI Score: -- Patient Global: --; Provider Global: -- Swollen: --; Tender: -- Joint Exam 02/28/2020   No joint exam has been documented for this visit   There is currently no information documented on the homunculus. Go to the Rheumatology activity and complete the homunculus joint exam.  Investigation: No additional findings.  Imaging: No results found.  Recent Labs: Lab Results  Component Value Date   WBC 9.1 09/29/2019   HGB 14.4 09/29/2019   PLT 161 09/29/2019   NA 142 09/29/2019   K 4.3 09/29/2019   CL 106 09/29/2019   CO2 27 09/29/2019   GLUCOSE 97 09/29/2019   BUN 16 09/29/2019   CREATININE 0.97 09/29/2019   BILITOT 0.8 09/29/2019   ALKPHOS 73 08/10/2017   AST 26 09/29/2019   ALT 16 09/29/2019   PROT 6.7 11/24/2019   ALBUMIN 4.7 08/10/2017   CALCIUM 10.0 09/29/2019   GFRAA 93 09/29/2019   QFTBGOLDPLUS POSITIVE (A) 11/24/2019    Speciality  Comments: No specialty comments available.  Procedures:  No procedures performed Allergies: Effexor [venlafaxine], Wellbutrin [bupropion], and Zoloft [sertraline hcl]     Assessment / Plan:     Visit Diagnoses: Limited systemic sclerosis (HCC) - Positive ANA 1:1280 Nucleolar, nucleolar speckled, sclerodactyly, telangiectasias, positive lupus anticoagulant and Raynaud's phenomena: He has skin tightness distal to MCPs, palmar telangiectasias, and delayed capillary refill on exam. No digital ulcerations or signs of gangrene were noted.  He has had less frequent symptoms of Raynaud's during the summer months.  He continues to experience a chronic cough and shortness of breath.  He has an upcoming [facility] tomorrow to update spirometry with DLCO.  His clinical features, lab work, and high-resolution chest CT results were reviewed with the patient today in the office. The diagnosis of limited systemic sclerosis was discussed in detail and all questions were addressed today.  The plan was to start the patient on low-dose Imuran (TPMT 12), after he initiated 2 months of Isoniazid, but he has not started therapy yet.  He has also been apprehensive to start on immunosuppressive therapy during the COVID-19 pandemic.  We discussed the risks and benefits today.  Indications, contraindications, and potential side effects of Imuran were discussed in detail and all questions were addressed.  Consent was obtained previously.  The plan is for him to start taking Isoniazid for 2 months and then he will notify us in order for [place] to send in the prescription for Imuran.  He will be completing a 80-month course of Isoniazid for the treatment of latent TB.  A prescription will be sent in for Imuran 50 mg 1 tablet by mouth daily and if labs are stable after 2 weeks he will increase to 100 mg by mouth daily.  He was advised to notify us if he cannot tolerate taking Imuran.  He will follow-up in the office in 3  months.    Lupus anticoagulant positive: He is taking Xarelto as prescribed.  Raynaud's syndrome without gangrene: He experiences intermittent symptoms of Raynaud's, which are typically more active during the winter months.  He has slightly delayed capillary refill but no digital ulcerations or signs of gangrene on exam.  ILD (interstitial lung disease) ([place]) - Pulmonary fibrosis.  He is followed by Dr. Vaughan Browner and Wyn Quaker NP.  High resolution chest CT ordered on 01/19/2020 revealed chronic interstitial lung disease characterized as probable UIP on high-resolution chest CT on 07/28/2019.  Results were reviewed today in the office. We  discussed the association with ILD and limited systemic sclerosis in detail and all questions were addressed. Dr. Estanislado Pandy has been in correspondence with Dr. Vaughan Browner and the plan was to start the patient on immunosuppressive therapy.  He was supposed to start on Imuran 50 mg 1 tablet by mouth daily and if labs are stable after 2 weeks he was going to increase to 100 mg by mouth daily.  In the meantime, he was diagnosed with latent tuberculosis and was evaluated by Dr. Tommy Medal who recommended taking Isoniazid for 2 months prior to starting on Imuran.  He has not started taking Isoniazid, but the plan is for him to take it for 9 months.  He will be picking up the prescription for Isoniazid once it is ready at the pharmacy.  He is apprehensive to start on immunosuppressive therapy during the COVID-19 pandemic.  We discussed the indications contraindications, potential side effects of Imuran today. He has received both COVID-19 vaccinations, and he was encouraged to continue to wear a mask and social distance.  He was advised to hold Imuran anytime he has an infection and to resume once the infection has completely cleared.  All questions were addressed today and consent was obtained previously.  He will complete 2 months of Isoniazid prior to starting on Imuran.  He was advised to  notify us once that has occurred.  He has an upcoming appointment tomorrow with [facility], and he will be repeating spirometry with DLCO.  He continues to have a chronic cough and shortness of breath.  He continues to smoke as well.   Latent tuberculosis: TB gold was positive on 11/24/2019.  He was referred to Dr. Tommy Medal, and he was evaluated on 12/19/19.  The plan was to start the patient on Isoniazid for 9 months.  According to Dr. Lucianne Lei Damn's note he should be able to initiate imuran 2 months after initiation therapy.  The patient has been apprehensive of potential side effects of both Isoniazid and Imuran.  He plans on picking up the prescription for Isoniazid once it is ready at the pharmacy.  He will notify us once he has completed 2 months and we will initiate Imuran as discussed above. The patient was in agreement.   Other medical conditions are listed as follows:  Paroxysmal atrial fibrillation [facility]): He is taking Xarelto as prescribed.   Atherosclerosis of aorta (HCC)  Acquired thrombophilia ([place])  Claudication in peripheral vascular disease (HCC)  Hx of colonic polyps  History of COPD  Vitamin D deficiency  History of hyperlipidemia  Anxiety and depression  Orders: No orders of the defined types were placed in this encounter.  No orders of the defined types were placed in this encounter.   Face-to-face time spent with patient was 30 minutes. Greater than 50% of time was spent in counseling and coordination of care.  Follow-Up Instructions: Return in about 3 months (around 05/29/2020) for Limited systemic sclerosis, ILD.   Ofilia Neas, PA-C  Note - This record has been created using Dragon software.  Chart creation errors have been sought, but may not always  have been located. Such creation errors do not reflect on  the standard of medical care.

## 2020-02-25 ENCOUNTER — Other Ambulatory Visit (HOSPITAL_COMMUNITY)
Admission: RE | Admit: 2020-02-25 | Discharge: 2020-02-25 | Disposition: A | Discharge status: Home / Self Care | Payer: PPO | Source: Ambulatory Visit | Attending: Pulmonary Disease | Admitting: Pulmonary Disease

## 2020-02-25 DIAGNOSIS — Z20822 Contact with and (suspected) exposure to covid-19: Secondary | ICD-10-CM | POA: Diagnosis not present

## 2020-02-25 DIAGNOSIS — Z01812 Encounter for preprocedural laboratory examination: Secondary | ICD-10-CM | POA: Diagnosis not present

## 2020-02-25 LAB — SARS CORONAVIRUS 2 (TAT 6-24 HRS): SARS Coronavirus 2: NEGATIVE

## 2020-02-28 ENCOUNTER — Encounter: Payer: Self-pay | Admitting: Physician Assistant

## 2020-02-28 ENCOUNTER — Ambulatory Visit: Payer: PPO | Admitting: Physician Assistant

## 2020-02-28 ENCOUNTER — Other Ambulatory Visit: Payer: Self-pay

## 2020-02-28 VITALS — BP 129/77 | HR 87 | Resp 16 | Ht 73.5 in | Wt 177.6 lb

## 2020-02-28 DIAGNOSIS — R760 Raised antibody titer: Secondary | ICD-10-CM

## 2020-02-28 DIAGNOSIS — I7300 Raynaud's syndrome without gangrene: Secondary | ICD-10-CM | POA: Diagnosis not present

## 2020-02-28 DIAGNOSIS — J849 Interstitial pulmonary disease, unspecified: Secondary | ICD-10-CM | POA: Diagnosis not present

## 2020-02-28 DIAGNOSIS — E559 Vitamin D deficiency, unspecified: Secondary | ICD-10-CM | POA: Diagnosis not present

## 2020-02-28 DIAGNOSIS — Z860100 Personal history of colon polyps, unspecified: Secondary | ICD-10-CM

## 2020-02-28 DIAGNOSIS — I739 Peripheral vascular disease, unspecified: Secondary | ICD-10-CM | POA: Diagnosis not present

## 2020-02-28 DIAGNOSIS — M349 Systemic sclerosis, unspecified: Secondary | ICD-10-CM

## 2020-02-28 DIAGNOSIS — Z8709 Personal history of other diseases of the respiratory system: Secondary | ICD-10-CM

## 2020-02-28 DIAGNOSIS — D6869 Other thrombophilia: Secondary | ICD-10-CM | POA: Diagnosis not present

## 2020-02-28 DIAGNOSIS — Z8639 Personal history of other endocrine, nutritional and metabolic disease: Secondary | ICD-10-CM | POA: Diagnosis not present

## 2020-02-28 DIAGNOSIS — F329 Major depressive disorder, single episode, unspecified: Secondary | ICD-10-CM

## 2020-02-28 DIAGNOSIS — I480 Paroxysmal atrial fibrillation: Secondary | ICD-10-CM | POA: Diagnosis not present

## 2020-02-28 DIAGNOSIS — F419 Anxiety disorder, unspecified: Secondary | ICD-10-CM

## 2020-02-28 DIAGNOSIS — I700 Atherosclerosis of aorta: Secondary | ICD-10-CM | POA: Diagnosis not present

## 2020-02-28 DIAGNOSIS — Z227 Latent tuberculosis: Secondary | ICD-10-CM

## 2020-02-29 ENCOUNTER — Ambulatory Visit (INDEPENDENT_AMBULATORY_CARE_PROVIDER_SITE_OTHER): Payer: PPO | Admitting: Pulmonary Disease

## 2020-02-29 ENCOUNTER — Ambulatory Visit: Payer: PPO | Admitting: Pulmonary Disease

## 2020-02-29 ENCOUNTER — Encounter: Payer: Self-pay | Admitting: Pulmonary Disease

## 2020-02-29 ENCOUNTER — Other Ambulatory Visit: Payer: Self-pay

## 2020-02-29 VITALS — BP 136/72 | HR 79 | Temp 97.3°F | Ht 74.0 in | Wt 175.0 lb

## 2020-02-29 DIAGNOSIS — J849 Interstitial pulmonary disease, unspecified: Secondary | ICD-10-CM

## 2020-02-29 DIAGNOSIS — I270 Primary pulmonary hypertension: Secondary | ICD-10-CM

## 2020-02-29 DIAGNOSIS — J432 Centrilobular emphysema: Secondary | ICD-10-CM | POA: Diagnosis not present

## 2020-02-29 LAB — PULMONARY FUNCTION TEST
DL/VA % pred: 83 %
DL/VA: 3.33 ml/min/mmHg/L
DLCO cor % pred: 67 %
DLCO cor: 20.04 ml/min/mmHg
DLCO unc % pred: 67 %
DLCO unc: 20.04 ml/min/mmHg
FEF 25-75 Pre: 2.3 L/sec
FEF2575-%Pred-Pre: 77 %
FEV1-%Pred-Pre: 78 %
FEV1-Pre: 3.04 L
FEV1FVC-%Pred-Pre: 97 %
FEV6-%Pred-Pre: 85 %
FEV6-Pre: 4.23 L
FEV6FVC-%Pred-Pre: 105 %
FVC-%Pred-Pre: 81 %
FVC-Pre: 4.23 L
Pre FEV1/FVC ratio: 72 %
Pre FEV6/FVC Ratio: 100 %

## 2020-02-29 NOTE — Progress Notes (Signed)
Spirometry and DLCO performed today. °

## 2020-02-29 NOTE — Progress Notes (Signed)
Bradley Ferguson    [id number]    June 10, 1951  Primary Care Physician:McKeown, Gwyndolyn Saxon, MD  Referring Physician: [name], MD [address] [place],  [place] [address]  Chief complaint: Follow-up for interstitial lung disease secondary to limited systemic sclerosis, emphysema  HPI: 69 year old smoker smoker with history of hyperlipidemia, emphysema, atrial fibrillation, peripheral artery disease.  Referred for evaluation of emphysema and abnormal CT showing pulmonary fibrosis  Chief complaint is dyspnea on exertion which is slowly worsening over the past 3 years.  This was initially attributed to severe peripheral vascular disease.  Also has cough, minimal sputum production.  Denies any fevers, chills.  Has back pain attributed to arthritis.  Also reports Raynaud's phenomena, occasional difficulty swallowing.  No dry mouth or dry eyes  Pets: Has 2 dogs.  No cats, birds, farm animals Occupation: Used to work in [employer], as a Research officer, trade union and odd jobs. Exposures: May have been exposed to asbestos.  Reports that his previous has had mold in the basement.  He moved out of it 2 years ago.  He also had feather pillows and comforters but got rid of them 2 years ago Smoking history: 45-pack-year smoker.  Continues to smoke up to 1 pack/day Travel history: No significant travel history Relevant family history: No significant family history of lung disease.  Interim history: Given diagnosis of limited systemic sclerosis per rheumatology. Plan was to start Imuran but his QuantiFERON was positive Seen by ID and recommended INH.  This therapy was delayed and he just started it 1 week ago ID is okay to start Imuran after 2 weeks of INH therapy  Outpatient Encounter Medications as of 02/29/2020  Medication Sig  . ALPRAZolam (XANAX) 1 MG tablet Take 1/2-1 tablet 2 - 3 x /day ONLY if needed for Anxiety Attack &  limit to 5 days /week to avoid addiction  .  atorvastatin (LIPITOR) 80 MG tablet Take 1 tablet Daily for Cholesterol  . CHOLECALCIFEROL PO Take 10,000 Units by mouth daily.  Marland Kitchen dofetilide (TIKOSYN) 500 MCG capsule TAKE ONE CAPSULE BY MOUTH TWO TIMES DAILY  . DULoxetine (CYMBALTA) 30 MG capsule Take    1 capsule    Daily     for Depression  . ketoconazole (NIZORAL) 2 % cream Apply to rash 1 to 2 x /day  . rivaroxaban (XARELTO) 20 MG TABS tablet Take 1 tablet Daily to Prevent Blood Clots  . triamcinolone cream (KENALOG) 0.5 % Apply 1 application topically 2 (two) times daily.   No facility-administered encounter medications on file as of 02/29/2020.   Physical Exam: Blood pressure 136/72, pulse 79, temperature (!) 97.3 F (36.3 C), temperature source Temporal, height 6\' 2"  (1.88 m), weight 175 lb (79.4 kg), SpO2 98 %. Gen:      No acute distress HEENT:  EOMI, sclera anicteric Neck:     No masses; no thyromegaly Lungs:    Clear to auscultation bilaterally; normal respiratory effort CV:         Regular rate and rhythm; no murmurs Abd:      + bowel sounds; soft, non-tender; no palpable masses, no distension Ext:    No edema; adequate peripheral perfusion Skin:      Warm and dry; no rash Neuro: alert and oriented x 3 Psych: normal mood and affect  Data Reviewed: Imaging: Low-dose screening CT 01/06/19-small pulmonary nodules measuring up to 4 mm.  Peripheral groundglass attenuation with reticulation, traction bronchiectasis with basal  gradient.  No honeycombing.  CT high-resolution 07/28/2019-basilar predominant fibrotic interstitial lung disease with no honeycombing.  There is subtle subpleural sparing.  Probable UIP versus NSIP. I have reviewed the images personally  PFTs: 02/29/2020 FVC 4.23 [81%), FEV1 3.04 [78%], F/F 72, DLCO 20.04 [67%] Moderate diffusion defect.  Labs: CBC 06/20/2019-WBC 7.1, eos 6.9%, absolute eosinophil count 490  CTD serologies 07/07/2019-positive ANA 1:320 [nuclear, speckled], 1:1280 [nuclear,  nucleolar]  Assessment:  Interstitial lung disease, pulmonary fibrosis Limited systemic sclerosis High-res CT reviewed with probable UIP versus NSIP pattern. He does report exposure to mold, down comforters and possible asbestos in the past. Probably has connective tissue disease with symptoms of Raynaud's and difficulty swallowing and significant ANA elevation.  Has been evaluated by rheumatology and given a diagnosis of limited systemic sclerosis Imuran recommended. Plan to start after he has received at least 2 months of INH therapy for positive QuantiFERON test.  He just started the INH last week.  We will get had a CT in January and follow-up in clinic after that  Active smoker Smoking cessation discussed with patient.  He is not ready to quit yet.  Will reassess at return visit.  Time spent counseling- 5 mins Continue low-dose screening CT of the chest  Stop Trelegy inhaler is not helping and is expensive He will check with his insurance company for cheaper alternatives  Plan/Recommendations: - Continue INH, start Imuran later this year - High-res CT in Jan - Smoking cessation  Marshell Garfinkel MD [facility] 02/29/2020, 12:30 PM  CC: [name], MD

## 2020-02-29 NOTE — Patient Instructions (Signed)
I have reviewed your rheumatology notes which indicate that you have a condition called osteoporosis I will advise you to start the Imuran after getting adequate therapy for latent TB We will schedule an echocardiogram for evaluation of pulmonary hypertension  High-resolution CT in January 2022 Follow-up in clinic after CT

## 2020-03-07 DIAGNOSIS — K4090 Unilateral inguinal hernia, without obstruction or gangrene, not specified as recurrent: Secondary | ICD-10-CM | POA: Diagnosis not present

## 2020-03-08 ENCOUNTER — Other Ambulatory Visit: Payer: Self-pay

## 2020-03-08 ENCOUNTER — Ambulatory Visit (HOSPITAL_COMMUNITY): Payer: PPO | Attending: Cardiology

## 2020-03-08 DIAGNOSIS — I270 Primary pulmonary hypertension: Secondary | ICD-10-CM | POA: Diagnosis not present

## 2020-03-08 LAB — ECHOCARDIOGRAM COMPLETE
Area-P 1/2: 2.24 cm2
S' Lateral: 2 cm

## 2020-03-19 ENCOUNTER — Telehealth: Payer: Self-pay

## 2020-03-19 DIAGNOSIS — K4090 Unilateral inguinal hernia, without obstruction or gangrene, not specified as recurrent: Secondary | ICD-10-CM

## 2020-03-19 NOTE — Telephone Encounter (Signed)
   [facility] Pre-operative Risk Assessment    HEARTCARE STAFF: - Please ensure there is not already an duplicate clearance open for this procedure. - Under Visit Info/Reason for Call, type in Other and utilize the format Clearance MM/DD/YY or Clearance TBD. Do not use dashes or single digits. - If request is for dental extraction, please clarify the # of teeth to be extracted.  Request for surgical clearance:  1. What type of surgery is being performed? Right inguinal hernia repair    2. When is this surgery scheduled? 03/23/20  3. What type of clearance is required (medical clearance vs. Pharmacy clearance to hold med vs. Both)? both  4. Are there any medications that need to be held prior to surgery and how long? Xarelot   5. Practice name and name of physician performing surgery? [facility]  Dr. Johnathan Hausen, MD   6. What is the office phone number? [phone]   7.   What is the office fax number? [phone]  8.   Anesthesia type (None, local, MAC, general) ? General    Bradley Ferguson Bradley Ferguson Bradley Ferguson 03/19/2020, 4:25 PM  _________________________________________________________________   (provider comments below)

## 2020-03-19 NOTE — Progress Notes (Signed)
Chart reviewed with Dr Valma Cava, Hx multiple comorbidities, states patient will be better served being done at [facility]. Crisoforo Oxford, scheduler, notified of need to move patient.

## 2020-03-20 ENCOUNTER — Other Ambulatory Visit (HOSPITAL_COMMUNITY)
Admission: RE | Admit: 2020-03-20 | Discharge: 2020-03-20 | Disposition: A | Discharge status: Home / Self Care | Payer: PPO | Source: Ambulatory Visit | Attending: Surgery | Admitting: Surgery

## 2020-03-20 DIAGNOSIS — Z20822 Contact with and (suspected) exposure to covid-19: Secondary | ICD-10-CM | POA: Diagnosis not present

## 2020-03-20 DIAGNOSIS — Z01812 Encounter for preprocedural laboratory examination: Secondary | ICD-10-CM | POA: Insufficient documentation

## 2020-03-20 LAB — SARS CORONAVIRUS 2 (TAT 6-24 HRS): SARS Coronavirus 2: NEGATIVE

## 2020-03-20 NOTE — Telephone Encounter (Addendum)
Patient with diagnosis of paroxysmal atrial fibrillation on Xarelto (rivaroxaban) 20 mg daily for anticoagulation.    Procedure: Right inguinal hernia repair Date of procedure: 03/23/2020  CHADS2-VASc score of  2 (AGE, CAD)  CrCl 82 mL/min (Scr 0.97 on 09/29/2019) Platelet count 161 (09/29/2019)   Per office protocol, patient can hold Xarelto (rivaroxaban) for 2 days prior to procedure.

## 2020-03-20 NOTE — Telephone Encounter (Signed)
Pharmacy please comment on anticoagulation and then I will contact the patient for clearance.  Kerin Ransom PA-C 03/20/2020 10:06 AM

## 2020-03-20 NOTE — Telephone Encounter (Signed)
   Primary Cardiologist: Dr [name]  Chart reviewed and patient contacted by phone today as part of pre-operative protocol coverage. Given past medical history and time since last visit, based on ACC/AHA guidelines, Bradley Ferguson would be at acceptable risk for the planned procedure without further cardiovascular testing.   OK to hold Xarelto two days pre op and resume as soon as safe post op.  The patient was informed of these instructions today.   The patient was advised that if he develops new symptoms prior to surgery to contact our office to arrange for a follow-up visit, and he verbalized understanding.  I will route this recommendation to the requesting party via Epic fax function and remove from pre-op pool.  Please call with questions.  Kerin Ransom, PA-C 03/20/2020, 2:33 PM
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
## 2020-03-22 ENCOUNTER — Other Ambulatory Visit: Payer: Self-pay

## 2020-03-22 ENCOUNTER — Encounter (HOSPITAL_COMMUNITY): Payer: Self-pay | Admitting: Surgery

## 2020-03-22 MED ORDER — BUPIVACAINE LIPOSOME 1.3 % IJ SUSP
20.0000 mL | Freq: Once | INTRAMUSCULAR | Status: DC
  Filled 2020-03-22: qty 20
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
## 2020-03-22 NOTE — Progress Notes (Addendum)
Bradley Ferguson denies chest pain or shortness of breath at this time.. Patient was tested for Covid on 9/21/21and has been in quarantine since that time. Bradley Ferguson stated that he stopped ASA "a while back", due to a nose bleed; patient stopped Xarelto as instructed 2 days prior to surgery- this was approved by Kerin Ransom, PA-C and was cleared for surgery.

## 2020-03-22 NOTE — Anesthesia Preprocedure Evaluation (Addendum)
Anesthesia Evaluation  Patient identified by MRN, date of birth, ID band  Reviewed: Patient's Chart, lab work & pertinent test results  Airway Mallampati: II  TM Distance: >3 FB Neck ROM: Full    Dental  (+) Edentulous Upper, Edentulous Lower   Pulmonary shortness of breath and with exertion, COPD,  COPD inhaler, Current Smoker and Patient abstained from smoking.,  ILD, latent TB 06/21   Pulmonary exam normal        Cardiovascular hypertension, + Peripheral Vascular Disease  + dysrhythmias Atrial Fibrillation  Rhythm:Regular Rate:Normal     Neuro/Psych Depression    GI/Hepatic Neg liver ROS, Right inguinal hernia   Endo/Other  negative endocrine ROS  Renal/GU negative Renal ROS     Musculoskeletal negative musculoskeletal ROS (+)   Abdominal (+)  Abdomen: soft. Bowel sounds: normal.  Peds  Hematology negative hematology ROS (+)   Anesthesia Other Findings   Reproductive/Obstetrics negative OB ROS                           Anesthesia Physical Anesthesia Plan  ASA: III  Anesthesia Plan: General   Post-op Pain Management:    Induction: Intravenous  PONV Risk Score and Plan: 1 and Ondansetron and Dexamethasone  Airway Management Planned: Mask, LMA and Oral ETT  Additional Equipment: None  Intra-op Plan:   Post-operative Plan: Extubation in OR  Informed Consent: I have reviewed the patients History and Physical, chart, labs and discussed the procedure including the risks, benefits and alternatives for the proposed anesthesia with the patient or authorized representative who has indicated his/her understanding and acceptance.     Dental advisory given  Plan Discussed with: CRNA and Surgeon  Anesthesia Plan Comments: (See PAT note written 03/22/2020 by Myra Gianotti, PA-C. On [place] for latent TB.   ECHO 09/21: Left ventricular ejection fraction, by estimation, is 65 to 70%.  The left ventricle has hyperdynamic function. The left ventricle has no regional wall motion abnormalities. There is mild left ventricular hypertrophy. Left ventricular diastolic parameters are consistent with Grade II diastolic dysfunction (pseudonormalization). 2. Right ventricular systolic function is normal. The right ventricular size is normal. There is normal pulmonary artery systolic pressure. The estimated right ventricular systolic pressure is 57.3 mmHg. 3. Left atrial size was mildly dilated. 4. Chiari network noted. Right atrial size was mildly dilated. 5. The mitral valve is normal in structure. No evidence of mitral valve regurgitation. No evidence of mitral stenosis. Moderate mitral annular calcification. 6. The aortic valve is tricuspid. Aortic valve regurgitation is not visualized. Mild aortic valve sclerosis is present, with no evidence of aortic valve stenosis. 7. The inferior vena cava is normal in size with greater than 50% respiratory variability, suggesting right atrial pressure of 3 mmHg.  Covid-19 Nucleic Acid Test Results Lab Results      Component                Value               Date                      SARSCOV2NAA              NEGATIVE            03/20/2020                SARSCOV2NAA  NEGATIVE            02/25/2020                SARSCOV2NAA              NEGATIVE            08/26/2019                SARSCOV2NAA              Not Detected        07/18/2019          )       Anesthesia Quick Evaluation

## 2020-03-23 ENCOUNTER — Ambulatory Visit (HOSPITAL_COMMUNITY)
Admission: RE | Admit: 2020-03-23 | Discharge: 2020-03-23 | Disposition: A | Discharge status: Home / Self Care | Payer: PPO | Attending: Surgery | Admitting: Surgery

## 2020-03-23 ENCOUNTER — Encounter (HOSPITAL_COMMUNITY)
Admission: RE | Disposition: A | Discharge status: Home / Self Care | Payer: Self-pay | Source: Home / Self Care | Attending: Surgery

## 2020-03-23 ENCOUNTER — Ambulatory Visit (HOSPITAL_COMMUNITY): Payer: PPO | Admitting: Vascular Surgery

## 2020-03-23 ENCOUNTER — Encounter (HOSPITAL_COMMUNITY): Payer: Self-pay | Admitting: Surgery

## 2020-03-23 DIAGNOSIS — Z888 Allergy status to other drugs, medicaments and biological substances status: Secondary | ICD-10-CM | POA: Insufficient documentation

## 2020-03-23 DIAGNOSIS — Z8261 Family history of arthritis: Secondary | ICD-10-CM | POA: Diagnosis not present

## 2020-03-23 DIAGNOSIS — K4090 Unilateral inguinal hernia, without obstruction or gangrene, not specified as recurrent: Secondary | ICD-10-CM

## 2020-03-23 DIAGNOSIS — M069 Rheumatoid arthritis, unspecified: Secondary | ICD-10-CM | POA: Diagnosis not present

## 2020-03-23 DIAGNOSIS — I4891 Unspecified atrial fibrillation: Secondary | ICD-10-CM | POA: Insufficient documentation

## 2020-03-23 DIAGNOSIS — F17200 Nicotine dependence, unspecified, uncomplicated: Secondary | ICD-10-CM | POA: Diagnosis not present

## 2020-03-23 DIAGNOSIS — J449 Chronic obstructive pulmonary disease, unspecified: Secondary | ICD-10-CM | POA: Diagnosis not present

## 2020-03-23 DIAGNOSIS — I10 Essential (primary) hypertension: Secondary | ICD-10-CM | POA: Diagnosis not present

## 2020-03-23 HISTORY — DX: Dyspnea, unspecified: R06.00

## 2020-03-23 HISTORY — PX: INSERTION OF MESH: SHX5868

## 2020-03-23 HISTORY — DX: Cardiac arrhythmia, unspecified: I49.9

## 2020-03-23 HISTORY — PX: INGUINAL HERNIA REPAIR: SHX194

## 2020-03-23 HISTORY — DX: Systemic sclerosis, unspecified: M34.9

## 2020-03-23 HISTORY — DX: Interstitial pulmonary disease, unspecified: J84.9

## 2020-03-23 LAB — CBC
HCT: 39.9 % (ref 39.0–52.0)
Hemoglobin: 12.6 g/dL — ABNORMAL LOW (ref 13.0–17.0)
MCH: 29.9 pg (ref 26.0–34.0)
MCHC: 31.6 g/dL (ref 30.0–36.0)
MCV: 94.8 fL (ref 80.0–100.0)
Platelets: 142 10*3/uL — ABNORMAL LOW (ref 150–400)
RBC: 4.21 MIL/uL — ABNORMAL LOW (ref 4.22–5.81)
RDW: 14.9 % (ref 11.5–15.5)
WBC: 7.5 10*3/uL (ref 4.0–10.5)
nRBC: 0 % (ref 0.0–0.2)

## 2020-03-23 LAB — BASIC METABOLIC PANEL
Anion gap: 8 (ref 5–15)
BUN: 13 mg/dL (ref 8–23)
CO2: 23 mmol/L (ref 22–32)
Calcium: 8.5 mg/dL — ABNORMAL LOW (ref 8.9–10.3)
Chloride: 106 mmol/L (ref 98–111)
Creatinine, Ser: 0.92 mg/dL (ref 0.61–1.24)
GFR calc Af Amer: >60 mL/min (ref >60)
GFR calc non Af Amer: >60 mL/min (ref >60)
Glucose, Bld: 95 mg/dL (ref 70–99)
Potassium: 4.1 mmol/L (ref 3.5–5.1)
Sodium: 137 mmol/L (ref 135–145)

## 2020-03-23 SURGERY — REPAIR, HERNIA, INGUINAL, ADULT
Anesthesia: General | Site: Inguinal | Laterality: Right

## 2020-03-23 MED ORDER — BUPIVACAINE LIPOSOME 1.3 % IJ SUSP
INTRAMUSCULAR | Status: DC | PRN
  Administered 2020-03-23: 20 mL

## 2020-03-23 MED ORDER — DEXAMETHASONE SODIUM PHOSPHATE 10 MG/ML IJ SOLN
INTRAMUSCULAR | Status: DC | PRN
  Administered 2020-03-23: 10 mg via INTRAVENOUS

## 2020-03-23 MED ORDER — MIDAZOLAM HCL 5 MG/5ML IJ SOLN
INTRAMUSCULAR | Status: DC | PRN
  Administered 2020-03-23: 2 mg via INTRAVENOUS

## 2020-03-23 MED ORDER — ONDANSETRON HCL 4 MG/2ML IJ SOLN
INTRAMUSCULAR | Status: AC
  Filled 2020-03-23: qty 2

## 2020-03-23 MED ORDER — CHLORHEXIDINE GLUCONATE 0.12 % MT SOLN: 15.0000 mL | Freq: Once | OROMUCOSAL | Status: AC

## 2020-03-23 MED ORDER — ORAL CARE MOUTH RINSE: 15.0000 mL | Freq: Once | OROMUCOSAL | Status: AC

## 2020-03-23 MED ORDER — CEFAZOLIN SODIUM-DEXTROSE 2-4 GM/100ML-% IV SOLN
INTRAVENOUS | Status: AC
  Filled 2020-03-23: qty 100

## 2020-03-23 MED ORDER — SCOPOLAMINE 1 MG/3DAYS TD PT72
1.0000 | MEDICATED_PATCH | TRANSDERMAL | Status: DC
  Administered 2020-03-23: 1.5 mg via TRANSDERMAL
  Filled 2020-03-23: qty 1

## 2020-03-23 MED ORDER — ONDANSETRON HCL 4 MG/2ML IJ SOLN
INTRAMUSCULAR | Status: DC | PRN
  Administered 2020-03-23: 4 mg via INTRAVENOUS

## 2020-03-23 MED ORDER — FENTANYL CITRATE (PF) 250 MCG/5ML IJ SOLN
INTRAMUSCULAR | Status: AC
Start: 2020-03-23 — End: ?
  Filled 2020-03-23: qty 5

## 2020-03-23 MED ORDER — DEXAMETHASONE SODIUM PHOSPHATE 10 MG/ML IJ SOLN
INTRAMUSCULAR | Status: AC
  Filled 2020-03-23: qty 1

## 2020-03-23 MED ORDER — FENTANYL CITRATE (PF) 250 MCG/5ML IJ SOLN
INTRAMUSCULAR | Status: DC | PRN
Start: 2020-03-23 — End: 2020-03-23
  Administered 2020-03-23: 50 ug via INTRAVENOUS
  Administered 2020-03-23: 100 ug via INTRAVENOUS

## 2020-03-23 MED ORDER — BUPIVACAINE HCL (PF) 0.25 % IJ SOLN
INTRAMUSCULAR | Status: AC
  Filled 2020-03-23: qty 30

## 2020-03-23 MED ORDER — HYDROMORPHONE HCL 1 MG/ML IJ SOLN: 0.2500 mg | INTRAMUSCULAR | Status: DC | PRN

## 2020-03-23 MED ORDER — ACETAMINOPHEN 325 MG PO TABS: 325.0000 mg | ORAL_TABLET | ORAL | Status: DC | PRN

## 2020-03-23 MED ORDER — PROPOFOL 10 MG/ML IV BOLUS
INTRAVENOUS | Status: AC
  Filled 2020-03-23: qty 20

## 2020-03-23 MED ORDER — MIDAZOLAM HCL 2 MG/2ML IJ SOLN
INTRAMUSCULAR | Status: AC
  Filled 2020-03-23: qty 2

## 2020-03-23 MED ORDER — HYDROCODONE-ACETAMINOPHEN 5-325 MG PO TABS
1.0000 | ORAL_TABLET | Freq: Once | ORAL | Status: AC
  Administered 2020-03-23: 1 via ORAL

## 2020-03-23 MED ORDER — PHENYLEPHRINE HCL-NACL 10-0.9 MG/250ML-% IV SOLN
INTRAVENOUS | Status: DC | PRN
  Administered 2020-03-23: 40 ug/min via INTRAVENOUS

## 2020-03-23 MED ORDER — CHLORHEXIDINE GLUCONATE CLOTH 2 % EX PADS: 6.0000 | MEDICATED_PAD | Freq: Once | CUTANEOUS | Status: DC

## 2020-03-23 MED ORDER — ACETAMINOPHEN 160 MG/5ML PO SOLN: 325.0000 mg | ORAL | Status: DC | PRN

## 2020-03-23 MED ORDER — ONDANSETRON HCL 4 MG/2ML IJ SOLN: 4.0000 mg | Freq: Once | INTRAMUSCULAR | Status: DC | PRN

## 2020-03-23 MED ORDER — LACTATED RINGERS IV SOLN: INTRAVENOUS | Status: DC

## 2020-03-23 MED ORDER — STERILE WATER FOR IRRIGATION IR SOLN
Status: DC | PRN
  Administered 2020-03-23: 1000 mL

## 2020-03-23 MED ORDER — LIDOCAINE HCL (PF) 1 % IJ SOLN
INTRAMUSCULAR | Status: AC
  Filled 2020-03-23: qty 30

## 2020-03-23 MED ORDER — HYDROCODONE-ACETAMINOPHEN 5-325 MG PO TABS
ORAL_TABLET | ORAL | Status: AC
  Filled 2020-03-23: qty 1

## 2020-03-23 MED ORDER — CHLORHEXIDINE GLUCONATE 0.12 % MT SOLN
OROMUCOSAL | Status: AC
  Administered 2020-03-23: 15 mL via OROMUCOSAL
  Filled 2020-03-23: qty 15

## 2020-03-23 MED ORDER — CEFAZOLIN SODIUM-DEXTROSE 2-4 GM/100ML-% IV SOLN
2.0000 g | INTRAVENOUS | Status: AC
  Administered 2020-03-23: 2 g via INTRAVENOUS

## 2020-03-23 MED ORDER — HYDROCODONE-ACETAMINOPHEN 5-325 MG PO TABS: 1.0000 | ORAL_TABLET | Freq: Four times a day (QID) | ORAL | 0 refills | Status: DC | PRN

## 2020-03-23 MED ORDER — PROPOFOL 10 MG/ML IV BOLUS
INTRAVENOUS | Status: DC | PRN
  Administered 2020-03-23: 140 mg via INTRAVENOUS

## 2020-03-23 MED ORDER — LIDOCAINE 2% (20 MG/ML) 5 ML SYRINGE
INTRAMUSCULAR | Status: DC | PRN
  Administered 2020-03-23: 40 mg via INTRAVENOUS

## 2020-03-23 MED ORDER — 0.9 % SODIUM CHLORIDE (POUR BTL) OPTIME
TOPICAL | Status: DC | PRN
  Administered 2020-03-23: 1000 mL

## 2020-03-23 SURGICAL SUPPLY — 44 items
ADH SKN CLS APL DERMABOND .7 (GAUZE/BANDAGES/DRESSINGS) ×1
BLADE CLIPPER SURG (BLADE) ×2 IMPLANT
CANISTER SUCT 3000ML PPV (MISCELLANEOUS) ×3 IMPLANT
COVER SURGICAL LIGHT HANDLE (MISCELLANEOUS) ×3 IMPLANT
DERMABOND ADVANCED (GAUZE/BANDAGES/DRESSINGS) ×2
DERMABOND ADVANCED .7 DNX12 (GAUZE/BANDAGES/DRESSINGS) IMPLANT
DRAIN PENROSE 1/2X12 LTX STRL (WOUND CARE) ×2 IMPLANT
DRAPE LAPAROTOMY TRNSV 102X78 (DRAPES) ×3 IMPLANT
ELECT REM PT RETURN 9FT ADLT (ELECTROSURGICAL) ×3
ELECTRODE REM PT RTRN 9FT ADLT (ELECTROSURGICAL) ×1 IMPLANT
GLOVE BIO SURGEON STRL SZ 6.5 (GLOVE) ×1 IMPLANT
GLOVE BIO SURGEON STRL SZ8 (GLOVE) ×3 IMPLANT
GLOVE BIO SURGEONS STRL SZ 6.5 (GLOVE) ×1
GLOVE BIOGEL PI IND STRL 6.5 (GLOVE) IMPLANT
GLOVE BIOGEL PI INDICATOR 6.5 (GLOVE) ×2
GLOVE SURG SS PI 6.5 STRL IVOR (GLOVE) ×2 IMPLANT
GOWN STRL REUS W/ TWL LRG LVL3 (GOWN DISPOSABLE) ×1 IMPLANT
GOWN STRL REUS W/TWL 2XL LVL3 (GOWN DISPOSABLE) ×3 IMPLANT
GOWN STRL REUS W/TWL LRG LVL3 (GOWN DISPOSABLE) ×9
KIT BASIN OR (CUSTOM PROCEDURE TRAY) ×3 IMPLANT
KIT TURNOVER KIT B (KITS) ×3 IMPLANT
MESH HERNIA 3X6 (Mesh General) ×2 IMPLANT
NDL HYPO 25GX1X1/2 BEV (NEEDLE) IMPLANT
NEEDLE 22X1 1/2 (OR ONLY) (NEEDLE) ×2 IMPLANT
NEEDLE HYPO 25GX1X1/2 BEV (NEEDLE) ×3 IMPLANT
NS IRRIG 1000ML POUR BTL (IV SOLUTION) ×3 IMPLANT
PACK GENERAL/GYN (CUSTOM PROCEDURE TRAY) ×3 IMPLANT
PAD ARMBOARD 7.5X6 YLW CONV (MISCELLANEOUS) ×6 IMPLANT
PENCIL SMOKE EVACUATOR (MISCELLANEOUS) ×3 IMPLANT
SCRUB TECHNI CARE 4 OZ NO DYE (MISCELLANEOUS) ×2 IMPLANT
SUT MNCRL AB 4-0 PS2 18 (SUTURE) ×2 IMPLANT
SUT PROLENE 2 0 CT2 30 (SUTURE) ×12 IMPLANT
SUT SILK 2 0 SH (SUTURE) ×2 IMPLANT
SUT VIC AB 2-0 SH 27 (SUTURE) ×3
SUT VIC AB 2-0 SH 27X BRD (SUTURE) ×1 IMPLANT
SUT VIC AB 3-0 54X BRD REEL (SUTURE) ×1 IMPLANT
SUT VIC AB 3-0 BRD 54 (SUTURE) ×3
SUT VIC AB 3-0 SH 27 (SUTURE) ×3
SUT VIC AB 3-0 SH 27XBRD (SUTURE) IMPLANT
SUT VIC AB 4-0 SH 18 (SUTURE) ×3 IMPLANT
SUT VIC AB 5-0 PS2 18 (SUTURE) ×3 IMPLANT
SYR CONTROL 10ML LL (SYRINGE) ×2 IMPLANT
TOWEL GREEN STERILE (TOWEL DISPOSABLE) ×3 IMPLANT
TOWEL GREEN STERILE FF (TOWEL DISPOSABLE) ×3 IMPLANT

## 2020-03-23 NOTE — Interval H&P Note (Signed)
History and Physical Interval Note:  03/23/2020 7:22 AM  Bradley Ferguson  has presented today for surgery, with the diagnosis of RIGHT INGUINAL HERNIA.  The various methods of treatment have been discussed with the patient and family. After consideration of risks, benefits and other options for treatment, the patient has consented to  Procedure(s): OPEN RIGHT INGUINAL HERNIA REPAIR (Right) as a surgical intervention.  The patient's history has been reviewed, patient examined, no change in status, stable for surgery.  I have reviewed the patient's chart and labs.  Questions were answered to the patient's satisfaction.  I have discussed with the patient that Dr. Deborra Medina will be assisting me in performing his surgery.     Pedro Earls

## 2020-03-23 NOTE — Discharge Instructions (Signed)

## 2020-03-23 NOTE — Op Note (Signed)
DATE OF PROCEDURE: 03/23/2020  PREOPERATIVE DIAGNOSIS: Right inguinal hernia repair   POSTOPERATIVE DIAGNOSIS: Same   PROCEDURE PERFORMED: Open right inguinal hernia repair with mesh   SURGEONS: Johnathan Hausen, MD Daiva Huge, MD (resident)   ANESTHESIA: LMA   INDICATIONS: The patient is a 69 y.o. Male presenting with symptomatic right inguinal hernia   PROCEDURE IN DETAIL:  The patient was brought into the operative suite, placed supine. Anesthesia was administered with LMA. Patient was strapped in place. The patient was prepped and draped in the usual sterile fashion.  The anterior superior iliac spine and pubic tubercle were identified on the Right side. An incision was made 1cm above the connecting line, representative of the location of the inguinal ligament. The subcutaneous tissue was bluntly dissected, scarpa's fascia was dissected away. The external abdominal oblique fascia was identified and sharply opened down to the external inguinal ring. The conjoint tendon and inguinal ligament were identified. The cord structures and sac were dissected free of the surrounding tissue in 360 degrees. A penrose drain was used to encircle the contents. The cremasteric fibers were dissected free of the contents of the cord and hernia sac. The cord structures (vessels and vas deferens) were identified and carefully dissected away from the hernia sac. The hernia sac was dissected down to the internal inguinal ring. Preperitoneal fat was identified showing appropriate dissection. The sac was ligate it and then reduced into the preperitoneal space. A 3x6 Bard Soft mesh was then used to close the defect and reinforce the floor. The mesh was sutured to the lacunar ligament and inguinal ligament using a 2-0 prolene in running fashion. Next the superior edge of the mesh was sutured to the conjoined tendon using a 2-0 interrupted Prolene. An additional 2-0 Prolene was used to suture the tail ends of the  mesh together re-creating the deep ring. Cord structures are running in a neutral position through the mesh. Next the external abdominal oblique fascia was closed with a 2-0 Vicryl in running fashion to re-create the external inguinal ring. Scarpa's fascia was closed with 4-0 Vicryl in running fashion. Skin was closed with a 4-0 Monocryl subcuticular stitch in running fashion. Dermabond place for dressing. Patient woke from anesthesia and brought to PACU in stable condition. All counts are correct.    ESTIMATED BLOOD LOSS: Minimal   SPECIMENS: None   COMPLICATIONS: None

## 2020-03-23 NOTE — Anesthesia Postprocedure Evaluation (Signed)
Anesthesia Post Note  Patient: Bradley Ferguson  Procedure(s) Performed: OPEN RIGHT INGUINAL HERNIA REPAIR WITH MESH (Right Inguinal) INSERTION OF MESH (Right Inguinal)     Patient location during evaluation: PACU Anesthesia Type: General Level of consciousness: awake and alert Pain management: pain level controlled Vital Signs Assessment: post-procedure vital signs reviewed and stable Respiratory status: spontaneous breathing, nonlabored ventilation, respiratory function stable and patient connected to nasal cannula oxygen Cardiovascular status: blood pressure returned to baseline and stable Postop Assessment: no apparent nausea or vomiting Anesthetic complications: no   No complications documented.  Last Vitals:  Vitals:   03/23/20 0945 03/23/20 1000  BP: 116/67 119/72  Pulse: 69 65  Resp: 16 14  Temp:  36.5 C  SpO2: 95% 95%    Last Pain:  Vitals:   03/23/20 0945  TempSrc:   PainSc: 5                  Valentine Kuechle P Caelin Rosen

## 2020-03-23 NOTE — Anesthesia Procedure Notes (Signed)
Procedure Name: LMA Insertion Date/Time: 03/23/2020 7:37 AM Performed by: Kyung Rudd, CRNA Pre-anesthesia Checklist: Patient identified, Emergency Drugs available, Suction available and Patient being monitored Patient Re-evaluated:Patient Re-evaluated prior to induction Oxygen Delivery Method: Circle system utilized Preoxygenation: Pre-oxygenation with 100% oxygen Induction Type: IV induction LMA: LMA inserted LMA Size: 4.0 Number of attempts: 1 Placement Confirmation: positive ETCO2 and breath sounds checked- equal and bilateral Tube secured with: Tape Dental Injury: Teeth and Oropharynx as per pre-operative assessment

## 2020-03-23 NOTE — Transfer of Care (Signed)
Immediate Anesthesia Transfer of Care Note  Patient: Bradley Ferguson  Procedure(s) Performed: OPEN RIGHT INGUINAL HERNIA REPAIR WITH MESH (Right Inguinal) INSERTION OF MESH (Right Inguinal)  Patient Location: PACU  Anesthesia Type:General  Level of Consciousness: awake, alert  and oriented  Airway & Oxygen Therapy: Patient Spontanous Breathing and Patient connected to nasal cannula oxygen  Post-op Assessment: Report given to RN and Post -op Vital signs reviewed and stable  Post vital signs: Reviewed and stable  Last Vitals:  Vitals Value Taken Time  BP 104/80 03/23/20 0918  Temp 36.5 C 03/23/20 0915  Pulse 65 03/23/20 0923  Resp 10 03/23/20 0923  SpO2 97 % 03/23/20 0923  Vitals shown include unvalidated device data.  Last Pain:  Vitals:   03/23/20 0915  TempSrc:   PainSc: 0-No pain      Patients Stated Pain Goal: 1 (46/65/99 3570)  Complications: No complications documented.

## 2020-03-23 NOTE — OR Nursing (Signed)
Teal Exparel armband placed on patient right wrist.

## 2020-03-26 ENCOUNTER — Encounter (HOSPITAL_COMMUNITY): Payer: Self-pay | Admitting: Surgery

## 2020-04-17 ENCOUNTER — Other Ambulatory Visit: Payer: Self-pay | Admitting: Internal Medicine

## 2020-04-17 DIAGNOSIS — Z961 Presence of intraocular lens: Secondary | ICD-10-CM | POA: Diagnosis not present

## 2020-04-17 DIAGNOSIS — H40013 Open angle with borderline findings, low risk, bilateral: Secondary | ICD-10-CM | POA: Diagnosis not present

## 2020-04-17 MED ORDER — DULOXETINE HCL 30 MG PO CPEP
ORAL_CAPSULE | ORAL | 0 refills | Status: DC
Start: 2020-04-17 — End: 2020-04-30

## 2020-04-23 ENCOUNTER — Other Ambulatory Visit: Payer: Self-pay

## 2020-04-23 ENCOUNTER — Ambulatory Visit: Payer: PPO | Admitting: Infectious Disease

## 2020-04-29 ENCOUNTER — Encounter: Payer: Self-pay | Admitting: Internal Medicine

## 2020-04-29 DIAGNOSIS — I10 Essential (primary) hypertension: Secondary | ICD-10-CM | POA: Insufficient documentation

## 2020-04-29 NOTE — Patient Instructions (Signed)

## 2020-04-29 NOTE — Progress Notes (Signed)
Annual  Screening/Preventative Visit  & Comprehensive Evaluation & Examination     This very nice 69 y.o. [name]  presents for a Screening /Preventative Visit & comprehensive evaluation and management of multiple medical co-morbidities.  Patient has been followed for HTN, HLD, Prediabetes and Vitamin D Deficiency. Patient also has hx/o BipolarDisorder & Depression - currently in remission. Chest CT scan on 01/20/2019  Showed Aortic Atherosclerosis.       Patient was recently dx'd by Rheumatology (Dr Estanislado Pandy) with limited systemic sclerosis with intentions to start Imuran when he has completed 2 months of INH for a positive QuantiFERON test.  high-resolution  Chest CT in Jan found basilar fibrotic interstitial lung disease with probable UIP & patient has also been evaluated by Pulmonary by Dr Vaughan Browner.      HTN predates since  1996. Patient's BP has been controlled at home. Today's BP is at goal - 116/82. In 2011, patient  underwent an Ablation for pAfib by Dr Caryl Comes. In 2018, Patient  had PCA, Arthrotomy of a Rt Common Iliac stenosis w/Stent implanted by Dr [name].  In 2019, patient  was hospitalized w/Afib (CHADsVASc 4) and started on Tykosyn & Xarelto.    Patient denies any cardiac symptoms as chest pain, palpitations, shortness of breath, dizziness or ankle swelling.      Patient's hyperlipidemia is controlled with diet and Atorvastatin. Patient denies myalgias or other medication SE's. Last lipids were at goal:  Lab Results  Component Value Date   CHOL 129 09/29/2019   HDL 43 09/29/2019   LDLCALC 63 09/29/2019   TRIG 146 09/29/2019   CHOLHDL 3.0 09/29/2019       Patient has prediabetes (A1c 5.9% /2017) and patient denies reactive hypoglycemic symptoms, visual blurring, diabetic polys or paresthesias. Last A1c was near goal:  Lab Results  Component Value Date   HGBA1C 5.7 (H) 09/29/2019        Finally, patient has history of Vitamin D Deficiency ("28" /2017) and last vitamin D was near  goal:  Lab Results  Component Value Date   VD25OH 52 09/29/2019    Current Outpatient Medications on File Prior to Visit  Medication Sig  . ALPRAZolam  1 MG tablet Take 1 mg  daily as needed for anxiety.   Marland Kitchen aspirin EC 81 MG tablet Take  daily  . atorvastatin  80 MG tablet Take 1 tablet Daily for Cholesterol  . Cholecalciferol 10,000 U  Take 10,000 Units  daily.   Marland Kitchen TIKOSYN 500 MCG capsule TAKE ONE CAPSULE  TWO TIMES DAILY   . DULoxetine  30 MG capsule Take    1 capsule    Daily     for Depression  . NORCO 5-325 MG t Take 1 tablet every 6 (hours as needed for moderate pain.  Marland Kitchen isoniazid  300 MG tablet Take 300 mg by mouth daily.  Marland Kitchen NIZORAL 2 % cream  Apply 1 application topically daily as needed (Rash)  . VITAMIN B-6  100 MG tablet Take 100 mg by mouth daily.  Alveda Reasons 20 MG TABS tablet Take 1 tablet Daily to Prevent Blood Clots   . triamcinolone cream 0.5 % Apply 1 application topically daily as needed (rash). )    Allergies  Allergen Reactions  . Effexor [Venlafaxine]     Feels bad  . Wellbutrin [Bupropion]     sleepy  . Zoloft [Sertraline Hcl]     Felt bad   Past Medical History:  Diagnosis Date  . Anxiety   .  Atrial fibrillation ([place])   . Blood transfusion without reported diagnosis 1976  . Cataract   . Depression   . Dyspnea    climbing stairs or walking "in big store- I get short of breathe."  . Dysrhythmia   . Emphysema of lung ([place])   . Heart murmur   . Hyperlipidemia   . ILD (interstitial lung disease) ([place])   . PVD (peripheral vascular disease) ([place])   . Raynaud's syndrome   . Systemic sclerosis ([place])   . TB lung, latent 12/19/2019   Health Maintenance  Topic Date Due  . COLONOSCOPY  07/24/2019  . INFLUENZA VACCINE  01/29/2020  . TETANUS/TDAP  07/01/2023  . COVID-19 Vaccine  Completed  . Hepatitis C Screening  Completed  . PNA vac Low Risk Adult  Completed   Immunization History  Administered Date(s) Administered  . Influenza Whole 04/30/2010   . Influenza, High Dose Seasonal PF 03/20/2017, 03/29/2018, 04/06/2019  . PFIZER SARS-COV-2 Vaccination 08/04/2019, 08/29/2019  . Pneumococcal Conjugate-13 02/05/2017  . Pneumococcal Polysaccharide-23 04/30/2010, 03/29/2018  . Td 06/30/2013   Last Colon - 07/23/2018 - Dr Henrene Pastor - #20 polyps thru-out the colon - Recommended f/u Colon in 6 months.(overdue)  Past Surgical History:  Procedure Laterality Date  . ATRIAL FIBRILLATION ABLATION    . CATARACT EXTRACTION Right   . COLONOSCOPY    . EYE SURGERY    . FRACTURE SURGERY Left 1976  . INGUINAL HERNIA REPAIR Right 03/23/2020   Procedure: OPEN RIGHT INGUINAL HERNIA REPAIR WITH MESH;  Surgeon: Johnathan Hausen, MD;  Location: [facility];  Service: General;  Laterality: Right;  . INSERTION OF MESH Right 03/23/2020   Procedure: INSERTION OF MESH;  Surgeon: Johnathan Hausen, MD;  Location: [facility];  Service: General;  Laterality: Right;  . LOWER EXTREMITY INTERVENTION N/A 04/27/2017   Procedure: LOWER EXTREMITY INTERVENTION;  Surgeon: Lorretta Harp, MD;  Location: [facility];  Service: Cardiovascular;  Laterality: N/A;  . PERIPHERAL VASCULAR ATHERECTOMY Right 04/27/2017   Procedure: PERIPHERAL VASCULAR ATHERECTOMY;  Surgeon: Lorretta Harp, MD;  Location: [facility];  Service: Cardiovascular;  Laterality: Right;  Iliac  . PERIPHERAL VASCULAR INTERVENTION Right 04/27/2017   Procedure: PERIPHERAL VASCULAR INTERVENTION;  Surgeon: Lorretta Harp, MD;  Location: [facility];  Service: Cardiovascular;  Laterality: Right;  Iliac   Family History  Problem Relation Age of Onset  . Cancer Mother        small cell lung cancer  . Cancer Father        unsure type  . Colon cancer Neg Hx   . Rectal cancer Neg Hx    Social History   Socioeconomic History  . Marital status: Divorced  . Number of children: 1  Occupational History  . 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 Smoking status: Current Every Day Smoker     Packs/day: 0.50    Years: 50.00    Pack years: 25.00    Types: Cigarettes    Start date: 60  . Smokeless tobacco: Never Used  . Tobacco comment: 1/2 pack per day 02/29/20 [facility]  Vaping Use  . Vaping Use: Never used  Substance and Sexual Activity  . Alcohol use: Yes    Alcohol/week: 2.0 standard drinks    Types: 2 Standard drinks or equivalent per week  . Drug use: Never  . Sexual activity: Not on file    ROS Constitutional: Denies fever, chills, weight loss/gain, headaches, insomnia,  night sweats or  change in appetite. Does c/o fatigue. Eyes: Denies redness, blurred vision, diplopia, discharge, itchy or watery eyes.  ENT: Denies discharge, congestion, post nasal drip, epistaxis, sore throat, earache, hearing loss, dental pain, Tinnitus, Vertigo, Sinus pain or snoring.  Cardio: Denies chest pain, palpitations, irregular heartbeat, syncope, dyspnea, diaphoresis, orthopnea, PND, claudication or edema Respiratory: denies cough, dyspnea, DOE, pleurisy, hoarseness, laryngitis or wheezing.  Gastrointestinal: Denies dysphagia, heartburn, reflux, water brash, pain, cramps, nausea, vomiting, bloating, diarrhea, constipation, hematemesis, melena, hematochezia, jaundice or hemorrhoids Genitourinary: Denies dysuria, frequency, urgency, nocturia, hesitancy, discharge, hematuria or flank pain Musculoskeletal: Denies arthralgia, myalgia, stiffness, Jt. Swelling, pain, limp or strain/sprain. Denies Falls. Skin: Denies puritis, rash, hives, warts, acne, eczema or change in skin lesion Neuro: No weakness, tremor, incoordination, spasms, paresthesia or pain Psychiatric: Denies confusion, memory loss or sensory loss. Denies Depression. Endocrine: Denies change in weight, skin, hair change, nocturia, and paresthesia, diabetic polys, visual blurring or hyper / hypo glycemic episodes.  Heme/Lymph: No excessive bleeding, bruising or enlarged lymph nodes.  Physical Exam  BP 116/82   Pulse (!) 101   Temp (!)  97.2 F (36.2 C)   Resp 16   Ht 6\' 2"  (1.88 m)   Wt 181 lb 6.4 oz (82.3 kg)   SpO2 95%   BMI 23.29 kg/m   General Appearance: Well nourished and well groomed and in no apparent distress.  Eyes: PERRLA, EOMs, conjunctiva no swelling or erythema, normal fundi and vessels. Sinuses: No frontal/maxillary tenderness ENT/Mouth: EACs patent / TMs  nl. Nares clear without erythema, swelling, mucoid exudates. Oral hygiene is good. No erythema, swelling, or exudate. Tongue normal, non-obstructing. Tonsils not swollen or erythematous. Hearing normal.  Neck: Supple, thyroid not palpable. No bruits, nodes or JVD. Respiratory: Respiratory effort normal.  BS equal and clear bilateral without rales, rhonci, wheezing or stridor. Cardio: Heart sounds are normal with regular rate and rhythm and no murmurs, rubs or gallops. Peripheral pulses are normal and equal bilaterally without edema. No aortic or femoral bruits. Chest: symmetric with normal excursions and percussion.  Abdomen: Soft, with Nl bowel sounds. Nontender, no guarding, rebound, hernias, masses, or organomegaly.  Lymphatics: Non tender without lymphadenopathy.  Musculoskeletal: Full ROM all peripheral extremities, joint stability, 5/5 strength, and normal gait. Skin: Warm and dry without rashes, lesions, cyanosis, clubbing or  ecchymosis.  Neuro: Cranial nerves intact, reflexes equal bilaterally. Normal muscle tone, no cerebellar symptoms. Sensation intact.  Pysch: Alert and oriented X 3 with normal affect, insight and judgment appropriate.   Assessment and Plan  1. Annual Preventative/Screening Exam    2. Essential hypertension  - EKG 12-Lead - [place], RETROPERITNL ABD,  LTD - Urinalysis, Routine w reflex microscopic - Microalbumin / creatinine urine ratio - CBC with Differential/Platelet - COMPLETE METABOLIC PANEL WITH GFR - Magnesium - TSH  3. Hyperlipidemia, mixed  - EKG 12-Lead - [place], RETROPERITNL ABD,  LTD - Lipid panel -  TSH  4. Abnormal glucose  - EKG 12-Lead - [place], RETROPERITNL ABD,  LTD - Hemoglobin A1c - Insulin, random  5. Vitamin D deficiency  - VITAMIN D 25 Hydroxy  6. Paroxysmal atrial fibrillation (HCC)  - EKG 12-Lead - TSH  7. Aortic atherosclerosis (H[facility])  - EKG 12-Lead - [place], RETROPERITNL ABD,  LTD - Lipid panel  8. Acquired thrombophilia ([place])  - CBC with Differential/Platelet  9. Prediabetes  - EKG 12-Lead - [place], RETROPERITNL ABD,  LTD - Hemoglobin A1c - Insulin, random  10. Chronic obstructive pulmonary disease(HCC)   11. Interstitial  pulmonary disease ([place])   12. BPH with obstruction/lower urinary tract symptoms  - PSA  13. TB lung, latent   14. Screening for colorectal cancer  - POC Hemoccult Bld/Stl  15. Prostate cancer screening  - PSA  16. Screening for ischemic heart disease  - EKG 12-Lead  17. Smoker  - EKG 12-Lead - [place], RETROPERITNL ABD,  LTD  18. Screening for AAA (aortic abdominal aneurysm)  - [place], RETROPERITNL ABD,  LTD  19. Medication management  - Urinalysis, Routine w reflex microscopic - Microalbumin / creatinine urine ratio - CBC with Differential/Platelet - COMPLETE METABOLIC PANEL WITH GFR - Magnesium - Lipid panel - TSH - Hemoglobin A1c - Insulin, random - VITAMIN D 25 Hydroxy         Patient was counseled in prudent diet, weight control to achieve/maintain BMI less than 25, BP monitoring, regular exercise and medications as discussed.  Discussed med effects and SE's. Routine screening labs and tests as requested with regular follow-up as recommended. Over 40 minutes of exam, counseling, chart review and high complex critical decision making was performed   Kirtland Bouchard, MD

## 2020-04-30 ENCOUNTER — Ambulatory Visit (INDEPENDENT_AMBULATORY_CARE_PROVIDER_SITE_OTHER): Payer: PPO | Admitting: Internal Medicine

## 2020-04-30 ENCOUNTER — Other Ambulatory Visit: Payer: Self-pay

## 2020-04-30 VITALS — BP 116/82 | HR 101 | Temp 97.2°F | Resp 16 | Ht 74.0 in | Wt 181.4 lb

## 2020-04-30 DIAGNOSIS — N401 Enlarged prostate with lower urinary tract symptoms: Secondary | ICD-10-CM | POA: Diagnosis not present

## 2020-04-30 DIAGNOSIS — F329 Major depressive disorder, single episode, unspecified: Secondary | ICD-10-CM

## 2020-04-30 DIAGNOSIS — R7309 Other abnormal glucose: Secondary | ICD-10-CM

## 2020-04-30 DIAGNOSIS — E782 Mixed hyperlipidemia: Secondary | ICD-10-CM | POA: Diagnosis not present

## 2020-04-30 DIAGNOSIS — Z79899 Other long term (current) drug therapy: Secondary | ICD-10-CM | POA: Diagnosis not present

## 2020-04-30 DIAGNOSIS — Z125 Encounter for screening for malignant neoplasm of prostate: Secondary | ICD-10-CM | POA: Diagnosis not present

## 2020-04-30 DIAGNOSIS — I700 Atherosclerosis of aorta: Secondary | ICD-10-CM

## 2020-04-30 DIAGNOSIS — Z227 Latent tuberculosis: Secondary | ICD-10-CM

## 2020-04-30 DIAGNOSIS — D6869 Other thrombophilia: Secondary | ICD-10-CM

## 2020-04-30 DIAGNOSIS — R7303 Prediabetes: Secondary | ICD-10-CM | POA: Diagnosis not present

## 2020-04-30 DIAGNOSIS — I10 Essential (primary) hypertension: Secondary | ICD-10-CM | POA: Diagnosis not present

## 2020-04-30 DIAGNOSIS — Z0000 Encounter for general adult medical examination without abnormal findings: Secondary | ICD-10-CM | POA: Diagnosis not present

## 2020-04-30 DIAGNOSIS — J849 Interstitial pulmonary disease, unspecified: Secondary | ICD-10-CM

## 2020-04-30 DIAGNOSIS — N138 Other obstructive and reflux uropathy: Secondary | ICD-10-CM

## 2020-04-30 DIAGNOSIS — Z136 Encounter for screening for cardiovascular disorders: Secondary | ICD-10-CM | POA: Diagnosis not present

## 2020-04-30 DIAGNOSIS — E559 Vitamin D deficiency, unspecified: Secondary | ICD-10-CM

## 2020-04-30 DIAGNOSIS — I480 Paroxysmal atrial fibrillation: Secondary | ICD-10-CM | POA: Diagnosis not present

## 2020-04-30 DIAGNOSIS — J449 Chronic obstructive pulmonary disease, unspecified: Secondary | ICD-10-CM

## 2020-04-30 DIAGNOSIS — Z0001 Encounter for general adult medical examination with abnormal findings: Secondary | ICD-10-CM

## 2020-04-30 DIAGNOSIS — F17200 Nicotine dependence, unspecified, uncomplicated: Secondary | ICD-10-CM

## 2020-04-30 DIAGNOSIS — Z1212 Encounter for screening for malignant neoplasm of rectum: Secondary | ICD-10-CM

## 2020-04-30 DIAGNOSIS — Z1211 Encounter for screening for malignant neoplasm of colon: Secondary | ICD-10-CM

## 2020-04-30 MED ORDER — DULOXETINE HCL 60 MG PO CPEP: ORAL_CAPSULE | ORAL | 0 refills | Status: DC

## 2020-05-01 LAB — COMPLETE METABOLIC PANEL WITH GFR
AG Ratio: 1.8 (calc) (ref 1.0–2.5)
ALT: 68 U/L — ABNORMAL HIGH (ref 9–46)
AST: 50 U/L — ABNORMAL HIGH (ref 10–35)
Albumin: 4.4 g/dL (ref 3.6–5.1)
Alkaline phosphatase (APISO): 78 U/L (ref 35–144)
BUN: 15 mg/dL (ref 7–25)
CO2: 27 mmol/L (ref 20–32)
Calcium: 9.5 mg/dL (ref 8.6–10.3)
Chloride: 102 mmol/L (ref 98–110)
Creat: 1.01 mg/dL (ref 0.70–1.25)
GFR, Est African American: 88 mL/min/{1.73_m2} (ref >=60)
GFR, Est Non African American: 76 mL/min/{1.73_m2} (ref >=60)
Globulin: 2.5 g/dL (calc) (ref 1.9–3.7)
Glucose, Bld: 152 mg/dL — ABNORMAL HIGH (ref 65–99)
Potassium: 4.8 mmol/L (ref 3.5–5.3)
Sodium: 137 mmol/L (ref 135–146)
Total Bilirubin: 0.9 mg/dL (ref 0.2–1.2)
Total Protein: 6.9 g/dL (ref 6.1–8.1)

## 2020-05-01 LAB — URINALYSIS, ROUTINE W REFLEX MICROSCOPIC
Bilirubin Urine: NEGATIVE
Glucose, UA: NEGATIVE
Hgb urine dipstick: NEGATIVE
Ketones, ur: NEGATIVE
Leukocytes,Ua: NEGATIVE
Nitrite: NEGATIVE
Protein, ur: NEGATIVE
Specific Gravity, Urine: 1.011 (ref 1.001–1.03)
pH: 6 (ref 5.0–8.0)

## 2020-05-01 LAB — TSH: TSH: 1.97 mIU/L (ref 0.40–4.50)

## 2020-05-01 LAB — CBC WITH DIFFERENTIAL/PLATELET
Absolute Monocytes: 604 cells/uL (ref 200–950)
Basophils Absolute: 61 cells/uL (ref 0–200)
Basophils Relative: 1 %
Eosinophils Absolute: 201 cells/uL (ref 15–500)
Eosinophils Relative: 3.3 %
HCT: 44 % (ref 38.5–50.0)
Hemoglobin: 14.4 g/dL (ref 13.2–17.1)
Lymphs Abs: 1470 cells/uL (ref 850–3900)
MCH: 30.8 pg (ref 27.0–33.0)
MCHC: 32.7 g/dL (ref 32.0–36.0)
MCV: 94 fL (ref 80.0–100.0)
MPV: 10.8 fL (ref 7.5–12.5)
Monocytes Relative: 9.9 %
Neutro Abs: 3764 cells/uL (ref 1500–7800)
Neutrophils Relative %: 61.7 %
Platelets: 167 10*3/uL (ref 140–400)
RBC: 4.68 10*6/uL (ref 4.20–5.80)
RDW: 13.4 % (ref 11.0–15.0)
Total Lymphocyte: 24.1 %
WBC: 6.1 10*3/uL (ref 3.8–10.8)

## 2020-05-01 LAB — HEMOGLOBIN A1C
Hgb A1c MFr Bld: 5.7 % of total Hgb — ABNORMAL HIGH (ref <5.7)
Mean Plasma Glucose: 117 (calc)
eAG (mmol/L): 6.5 (calc)

## 2020-05-01 LAB — MICROALBUMIN / CREATININE URINE RATIO
Creatinine, Urine: 123 mg/dL (ref 20–320)
Microalb Creat Ratio: 5 mcg/mg creat (ref <30)
Microalb, Ur: 0.6 mg/dL

## 2020-05-01 LAB — LIPID PANEL
Cholesterol: 200 mg/dL — ABNORMAL HIGH (ref <200)
HDL: 43 mg/dL (ref >=40)
LDL Cholesterol (Calc): 131 mg/dL (calc) — ABNORMAL HIGH
Non-HDL Cholesterol (Calc): 157 mg/dL (calc) — ABNORMAL HIGH (ref <130)
Total CHOL/HDL Ratio: 4.7 (calc) (ref <5.0)
Triglycerides: 149 mg/dL (ref <150)

## 2020-05-01 LAB — INSULIN, RANDOM: Insulin: 26.9 u[IU]/mL — ABNORMAL HIGH

## 2020-05-01 LAB — PSA: PSA: 0.95 ng/mL (ref <=4.0)

## 2020-05-01 LAB — VITAMIN D 25 HYDROXY (VIT D DEFICIENCY, FRACTURES): Vit D, 25-Hydroxy: 41 ng/mL (ref 30–100)

## 2020-05-01 LAB — MAGNESIUM: Magnesium: 2.1 mg/dL (ref 1.5–2.5)

## 2020-05-01 NOTE — Progress Notes (Signed)
========================================================== -   Test results slightly outside the reference range are not unusual. If there is anything important, I will review this with you,  otherwise it is considered normal test values.  If you have further questions,  please do not hesitate to contact me at the office or via My Chart.  ==========================================================  -  PSA - Low - Great  ==========================================================  -  Glucose = 152 is elevated   And A1c = 5.7 - a little elevated, So . . . . . . .  Marland Kitchen  - Avoid Sweets, Candy & White Stuff   - Rice, Potatoes, Breads &  Pasta ==========================================================  -  Also 2 liver enzymes are slightly elevated , So . . . .  Marland Kitchen Avoid Alcohol ==========================================================  -  Total Chol = 200 - slightly elevated  (Ideal or Goal is less than 180)   - and   - LDL Chol = 131 - moderately elevated  (Ideal or Goal is less than 70) , So. . . . . . . . . Marland Kitchen  - if you are taking your Chol meds,  then you need to get on a much stricter low chol diet to  avoid having a heart attack, stroke, kidney failure or gangrene in legs.    - Cholesterol only comes from animal sources  - ie. meat, dairy, egg yolks  - Eat all the vegetables you want.  - Avoid meat, especially red meat - Beef AND Pork .  - Avoid cheese & dairy - milk & ice cream.     - Cheese is the most concentrated form of trans-fats which  is the worst thing to clog up our arteries.   - Veggie cheese is OK which can be found in the fresh  produce section at [employer] or [employer] or [employer] ==========================================================  -  All Else - CBC - Kidneys - Electrolytes - Magnesium & Thyroid    - all  Normal / OK ==========================================================                           ]

## 2020-05-12 ENCOUNTER — Ambulatory Visit: Payer: PPO | Attending: Internal Medicine

## 2020-05-12 DIAGNOSIS — Z23 Encounter for immunization: Secondary | ICD-10-CM

## 2020-05-12 NOTE — Progress Notes (Signed)
   Covid-[facility] Vaccination [facility]  Name:  JAYMEN FETCH    MRN: [id number] DOB: September 18, 1950  05/12/2020  Mr. Kingbird was observed post Covid-19 immunization for 15 minutes without incident. He was provided with Vaccine Information Sheet and instruction to access the V-Safe system.   Mr. Lagrow was instructed to call 911 with any severe reactions post vaccine: Marland Kitchen Difficulty breathing  . Swelling of face and throat  . A fast heartbeat  . A bad rash all over body  . Dizziness and weakness   Immunizations Administered    No immunizations on file.

## 2020-05-17 NOTE — Progress Notes (Signed)
Office Visit Note  Patient: Bradley Ferguson             Date of Birth: 1950/11/19           MRN: [id number]             PCP: [name], MD Referring: [name], MD Visit Date: 05/31/2020 Occupation: @GUAROCC @  Subjective:  Discuss starting on Imuran   History of Present Illness: Bradley Ferguson is a 69 y.o. male with history of limited systemic sclerosis and ILD.  He has been taking INH 300 mg daily for management of latent TB for the past 2 months.  He has been tolerating INH without any side effects.  He has not started on Imuran yet, and he reports still being apprehensive to initiate due to potential side effects.  He states he further discussed imuran with his PCP, Dr. Melford Aase at his routine follow up who recommended proceeding with imuran according to the patient.  He denies any new or worsening symptoms since his last office visit.  He states he still has telangiectasias on his hands and some skin tightness in his fingers.  He has occasional dysphagia but denies any constipation.  He experiences some discomfort in the right lower extremity with activity due to his history of claudication secondary to PVD.    Activities of Daily Living:  Patient reports morning stiffness for   none.   Patient Reports nocturnal pain.  Difficulty dressing/grooming: Denies Difficulty climbing stairs: Reports Difficulty getting out of chair: Denies Difficulty using hands for taps, buttons, cutlery, and/or writing: Reports  Review of Systems  Constitutional: Positive for fatigue. Negative for night sweats.  HENT: Negative for mouth sores, mouth dryness and nose dryness.   Eyes: Negative for redness and dryness.  Respiratory: Positive for shortness of breath. Negative for difficulty breathing.   Cardiovascular: Negative for chest pain, palpitations, hypertension, irregular heartbeat and swelling in legs/feet.  Gastrointestinal: Negative for constipation and diarrhea.  Endocrine: Positive  for cold intolerance. Negative for increased urination.  Genitourinary: Negative for painful urination.  Musculoskeletal: Positive for muscle weakness. Negative for arthralgias, joint pain, joint swelling, myalgias, morning stiffness, muscle tenderness and myalgias.  Skin: Positive for rash. Negative for color change, hair loss, nodules/bumps, skin tightness, ulcers and sensitivity to sunlight.  Allergic/Immunologic: Negative for susceptible to infections.  Neurological: Positive for weakness. Negative for dizziness, fainting, memory loss and night sweats.  Hematological: Positive for bruising/bleeding tendency. Negative for swollen glands.  Psychiatric/Behavioral: Positive for sleep disturbance. Negative for depressed mood. The patient is not nervous/anxious.     PMFS History:  Patient Active Problem List   Diagnosis Date Noted  . Essential hypertension 04/29/2020  . Right inguinal hernia 03/19/2020  . TB lung, latent 12/19/2019  . Systemic scleroses ([place]) 12/19/2019  . Centrilobular emphysema ([place]) 11/24/2019  . Interstitial pulmonary disease ([place]) 11/24/2019  . Positive ANA (antinuclear antibody) 11/24/2019  . Atherosclerosis of aorta ([place]) 06/30/2019  . Chronic obstructive pulmonary disease ([place]) 06/30/2019  . Acquired thrombophilia ([place]) 05/10/2019  . Colon polyps 12/06/2018  . Visit for monitoring Tikosyn therapy 04/27/2018  . Claudication in peripheral vascular disease ([place]) 04/16/2017  . Labile hypertension 02/29/2016  . Other abnormal glucose (prediabetes) 02/29/2016  . BPH 02/29/2016  . Hyperlipidemia, mixed 11/12/2015  . Vitamin D deficiency 11/12/2015  . Bipolar I disorder ([place]) 08/13/2015  . Atrial fibrillation ([place]) 08/13/2015  . Attention deficit disorder 08/08/2010  . Dental caries 08/08/2010  . ANXIETY DEPRESSION 06/06/2010  .  TOBACCO ABUSE 06/06/2010  . RAYNAUDS SYNDROME 06/06/2010    Past Medical History:  Diagnosis Date  . Anxiety   . Atrial fibrillation  ([place])   . Blood transfusion without reported diagnosis 1976  . Cataract   . Depression   . Dyspnea    climbing stairs or walking "in big store- I get short of breathe."  . Dysrhythmia   . Emphysema of lung ([place])   . Heart murmur   . Hyperlipidemia   . ILD (interstitial lung disease) ([place])   . PVD (peripheral vascular disease) ([place])   . Raynaud's syndrome   . Systemic sclerosis ([place])   . TB lung, latent 12/19/2019    Family History  Problem Relation Age of Onset  . Cancer Mother        small cell lung cancer  . Cancer Father        unsure type  . Colon cancer Neg Hx   . Rectal cancer Neg Hx    Past Surgical History:  Procedure Laterality Date  . ATRIAL FIBRILLATION ABLATION    . CATARACT EXTRACTION Right   . COLONOSCOPY    . EYE SURGERY    . FRACTURE SURGERY Left 1976  . INGUINAL HERNIA REPAIR Right 03/23/2020   Procedure: OPEN RIGHT INGUINAL HERNIA REPAIR WITH MESH;  Surgeon: Johnathan Hausen, MD;  Location: [facility];  Service: General;  Laterality: Right;  . INSERTION OF MESH Right 03/23/2020   Procedure: INSERTION OF MESH;  Surgeon: Johnathan Hausen, MD;  Location: [facility];  Service: General;  Laterality: Right;  . LOWER EXTREMITY INTERVENTION N/A 04/27/2017   Procedure: LOWER EXTREMITY INTERVENTION;  Surgeon: Lorretta Harp, MD;  Location: [facility];  Service: Cardiovascular;  Laterality: N/A;  . PERIPHERAL VASCULAR ATHERECTOMY Right 04/27/2017   Procedure: PERIPHERAL VASCULAR ATHERECTOMY;  Surgeon: Lorretta Harp, MD;  Location: [facility];  Service: Cardiovascular;  Laterality: Right;  Iliac  . PERIPHERAL VASCULAR INTERVENTION Right 04/27/2017   Procedure: PERIPHERAL VASCULAR INTERVENTION;  Surgeon: Lorretta Harp, MD;  Location: [facility];  Service: Cardiovascular;  Laterality: Right;  Iliac   Social History   Social History Narrative  . Not on file   Immunization History  Administered Date(s) Administered  . Influenza Whole 04/30/2010    . Influenza, High Dose Seasonal PF 03/20/2017, 03/29/2018, 04/06/2019  . Moderna SARS-COV2 Booster Vaccination 05/12/2020  . Moderna SARS-COVID-2 Vaccination 05/12/2020  . PFIZER SARS-COV-2 Vaccination 08/04/2019, 08/29/2019  . Pneumococcal Conjugate-13 02/05/2017  . Pneumococcal Polysaccharide-23 04/30/2010, 03/29/2018  . Td 06/30/2013     Objective: Vital Signs: BP (!) 106/56 (BP Location: Left Arm, Patient Position: Sitting, Cuff Size: Normal)   Pulse 81   Resp 16   Ht 6' 1.5" (1.867 m)   Wt 184 lb (83.5 kg)   BMI 23.95 kg/m    Physical Exam Vitals and nursing note reviewed.  Constitutional:      Appearance: He is well-developed.  HENT:     Head: Normocephalic and atraumatic.  Eyes:     Conjunctiva/sclera: Conjunctivae normal.     Pupils: Pupils are equal, round, and reactive to light.  Pulmonary:     Effort: Pulmonary effort is normal.  Abdominal:     Palpations: Abdomen is soft.  Musculoskeletal:     Cervical back: Normal range of motion and neck supple.  Skin:    General: Skin is warm and dry.     Capillary Refill: Capillary refill takes less than 2 seconds.  Comments: Scattered telangectasias on palms.  Sclerodactyly distal to MCPs noted.   Neurological:     Mental Status: He is alert and oriented to person, place, and time.  Psychiatric:        Behavior: Behavior normal.      Musculoskeletal Exam: C-spine limited ROM with lateral rotation.  Postural thoracic kyphosis.  Shoulder joints, elbow joints, and wrist joints good ROM. PIP and DIP thickening consistent with osteoarthritis of both hands. Hip joints good ROM with no discomfort.  Knee joints good ROM with no discomfort. Warmth of the right knee joint noted. Ankle joints good ROM with no discomfort.   CDAI Exam: CDAI Score: -- Patient Global: --; Provider Global: -- Swollen: --; Tender: -- Joint Exam 05/31/2020   No joint exam has been documented for this visit   There is currently no information  documented on the homunculus. Go to the Rheumatology activity and complete the homunculus joint exam.  Investigation: No additional findings.  Imaging: No results found.  Recent Labs: Lab Results  Component Value Date   WBC 6.1 04/30/2020   HGB 14.4 04/30/2020   PLT 167 04/30/2020   NA 137 04/30/2020   K 4.8 04/30/2020   CL 102 04/30/2020   CO2 27 04/30/2020   GLUCOSE 152 (H) 04/30/2020   BUN 15 04/30/2020   CREATININE 1.01 04/30/2020   BILITOT 0.9 04/30/2020   ALKPHOS 73 08/10/2017   AST 50 (H) 04/30/2020   ALT 68 (H) 04/30/2020   PROT 6.9 04/30/2020   ALBUMIN 4.7 08/10/2017   CALCIUM 9.5 04/30/2020   GFRAA 88 04/30/2020   QFTBGOLDPLUS POSITIVE (A) 11/24/2019    Speciality Comments: No specialty comments available.  Procedures:  No procedures performed Allergies: Effexor [venlafaxine], Wellbutrin [bupropion], and Zoloft [sertraline hcl]   Assessment / Plan:     Visit Diagnoses: Limited systemic sclerosis ([place]) - Positive ANA 1:1280 Nucleolar, nucleolar speckled, sclerodactyly, telangiectasias, positive lupus anticoagulant and Raynaud's phenomena: He has not noticed any new or worsening symptoms since his last office visit on 02/28/20.  He continues to experience symptoms of raynaud's intermittently.  No digital ulcerations or signs of gangrene were noted on exam.  No progression in sclerodactyly noted.  Scattered telangiectasias noted on palms.  No signs of inflammatory arthritis at this time.  He has ongoing SOB and intermittent symptoms of dysphagia.   The plan was to start him on Imuran 2 months after starting on INH for management of latent tuberculosis as recommended by Dr. Drucilla Schmidt.  He has taken [place] for 2 months, but he remains apprehensive to start on Imuran.  Both Dr. Estanislado Pandy and I discussed the risks and benefits of the use of Imuran in detail today.  All questions were addressed.  A second opinion with another rheumatologist was offered but declined.  He has  discussed the use of imuran with his PCP, Dr. Melford Aase, and Dr. Vaughan Browner, and he agrees to try a trial of imuran.  Lab work from 04/30/2020 was reviewed today in the office:  LFTs are elevated (AST 50 and ALT 68) and were normal prior to initiating INH. Dr. Estanislado Pandy would like to hold off on starting imuran until his LFTs have returned to [facility]  He will be contacting Dr. Tommy Medal to discuss the frequency of lab monitoring, and he will notify us once his LFTs have returned to [facility] in order to nitiate imuran.  Consent is on file.   He was advised to notify us if he develops any new or  worsening of symptoms.  He will follow up in 6 months.    Lupus anticoagulant positive -He is taking Xarelto 20 mg at bedtime. Discussed aspirin due to frequent bruising.   Raynaud's syndrome without gangrene: He has intermittent symptoms of raynaud's.  No digital ulcerations or signs or gangrene noted on exam.    ILD (interstitial lung disease) ([place]) - Pulmonary fibrosis.  He is followed by Dr. Vaughan Browner and Wyn Quaker NP.  High-resolution chest CT on 01/19/20 revealed bibasilar fibrotic ILD with probable UIP.  Patient is being followed by Dr. Vaughan Browner.  Most recent appointment was on 02/29/20. PFTs updated on 02/29/2020 which revealed moderate diffuse defect.  Echo performed on 03/08/2020 for evaluation of pulmonary hypertension: normal pulmonary artery systolic pressure noted. He continues to have SOB and a chronic cough.  He remains apprehensive to start on Imuran. Risks and benefits of Imuran use was discussed in detail. Dr. Estanislado Pandy also discuss the use of imuran in detail with the patient today.  He has agreed to start on imuran once his LFTs have returned to [facility].  Consent for imuran is on file.  Latent tuberculosis - TB gold was positive on 11/24/2019.  He was referred to Dr. Tommy Medal, and he was evaluated on 12/19/19.  He is currently taking Isoniazid 300 mg daily and is tolerating it without any side effects. He was advised to call Dr.  Drucilla Schmidt to discuss recent elevation in LFTs and if he will require more frequent lab monitoring.  I spoke with Dr. Estanislado Pandy and we are both apprehensive to start imuran until his LFTs have returned to [facility].    Paroxysmal atrial fibrillation (HCC) -He is taking xarelto 20 mg at bedtime.  He discontinued aspirin due to increased bruising.   Other medical conditions are listed as follows:   Atherosclerosis of aorta (HCC)  Acquired thrombophilia ([place])  Claudication in peripheral vascular disease (HCC)  Hx of colonic polyps  History of COPD  Vitamin D deficiency  History of hyperlipidemia  Anxiety and depression  Orders: No orders of the defined types were placed in this encounter.  No orders of the defined types were placed in this encounter.    Follow-Up Instructions: Return in 6 months (on 11/29/2020) for Limited systemic sclerosis, ILD.   Ofilia Neas, PA-C  Note - This record has been created using Dragon software.  Chart creation errors have been sought, but may not always  have been located. Such creation errors do not reflect on  the standard of medical care.

## 2020-05-18 ENCOUNTER — Other Ambulatory Visit: Payer: Self-pay | Admitting: Physician Assistant

## 2020-05-31 ENCOUNTER — Encounter: Payer: Self-pay | Admitting: Physician Assistant

## 2020-05-31 ENCOUNTER — Ambulatory Visit: Payer: PPO | Admitting: Physician Assistant

## 2020-05-31 ENCOUNTER — Other Ambulatory Visit: Payer: Self-pay

## 2020-05-31 VITALS — BP 106/56 | HR 81 | Resp 16 | Ht 73.5 in | Wt 184.0 lb

## 2020-05-31 DIAGNOSIS — I739 Peripheral vascular disease, unspecified: Secondary | ICD-10-CM | POA: Diagnosis not present

## 2020-05-31 DIAGNOSIS — R760 Raised antibody titer: Secondary | ICD-10-CM | POA: Diagnosis not present

## 2020-05-31 DIAGNOSIS — F32A Depression, unspecified: Secondary | ICD-10-CM

## 2020-05-31 DIAGNOSIS — J849 Interstitial pulmonary disease, unspecified: Secondary | ICD-10-CM

## 2020-05-31 DIAGNOSIS — I7300 Raynaud's syndrome without gangrene: Secondary | ICD-10-CM | POA: Diagnosis not present

## 2020-05-31 DIAGNOSIS — I700 Atherosclerosis of aorta: Secondary | ICD-10-CM

## 2020-05-31 DIAGNOSIS — E559 Vitamin D deficiency, unspecified: Secondary | ICD-10-CM

## 2020-05-31 DIAGNOSIS — Z86010 Personal history of colonic polyps: Secondary | ICD-10-CM | POA: Diagnosis not present

## 2020-05-31 DIAGNOSIS — I480 Paroxysmal atrial fibrillation: Secondary | ICD-10-CM | POA: Diagnosis not present

## 2020-05-31 DIAGNOSIS — Z8709 Personal history of other diseases of the respiratory system: Secondary | ICD-10-CM

## 2020-05-31 DIAGNOSIS — D6869 Other thrombophilia: Secondary | ICD-10-CM

## 2020-05-31 DIAGNOSIS — Z227 Latent tuberculosis: Secondary | ICD-10-CM | POA: Diagnosis not present

## 2020-05-31 DIAGNOSIS — M349 Systemic sclerosis, unspecified: Secondary | ICD-10-CM

## 2020-05-31 DIAGNOSIS — Z8639 Personal history of other endocrine, nutritional and metabolic disease: Secondary | ICD-10-CM

## 2020-05-31 DIAGNOSIS — F419 Anxiety disorder, unspecified: Secondary | ICD-10-CM

## 2020-06-04 ENCOUNTER — Telehealth: Payer: Self-pay

## 2020-06-04 ENCOUNTER — Other Ambulatory Visit: Payer: Self-pay | Admitting: Pharmacist

## 2020-06-04 DIAGNOSIS — Z227 Latent tuberculosis: Secondary | ICD-10-CM

## 2020-06-04 MED ORDER — ISONIAZID 300 MG PO TABS: 300.0000 mg | ORAL_TABLET | Freq: Every day | ORAL | 0 refills | Status: DC

## 2020-06-04 NOTE — Telephone Encounter (Signed)
Yes I think that is fine! Do you need me to send it in?

## 2020-06-04 NOTE — Telephone Encounter (Signed)
Patient called to ask if isoniazid could be switched to a 90 day supply. He says he doesn't use his insurance, he pays out of pocket using [employer] and that a 90 day supply would be cheaper.   Beryle Flock, RN

## 2020-06-06 ENCOUNTER — Encounter: Payer: Self-pay | Admitting: Cardiovascular Disease

## 2020-06-06 ENCOUNTER — Other Ambulatory Visit: Payer: Self-pay

## 2020-06-06 ENCOUNTER — Ambulatory Visit (INDEPENDENT_AMBULATORY_CARE_PROVIDER_SITE_OTHER): Payer: PPO | Admitting: Cardiovascular Disease

## 2020-06-06 DIAGNOSIS — E782 Mixed hyperlipidemia: Secondary | ICD-10-CM

## 2020-06-06 DIAGNOSIS — F17200 Nicotine dependence, unspecified, uncomplicated: Secondary | ICD-10-CM

## 2020-06-06 DIAGNOSIS — I480 Paroxysmal atrial fibrillation: Secondary | ICD-10-CM

## 2020-06-06 DIAGNOSIS — I739 Peripheral vascular disease, unspecified: Secondary | ICD-10-CM | POA: Diagnosis not present

## 2020-06-06 DIAGNOSIS — R0989 Other specified symptoms and signs involving the circulatory and respiratory systems: Secondary | ICD-10-CM

## 2020-06-06 NOTE — Assessment & Plan Note (Signed)
History of labile hypertension blood pressure measured today 124/62.  He is currently not on antihypertensive medications.

## 2020-06-06 NOTE — Progress Notes (Signed)
06/06/2020 Bradley Ferguson   [birth year]  [id number]  Primary Physician [name], MD Primary Cardiologist: Lorretta Harp MD [name], [place]  HPI:  Bradley Ferguson is a 69 y.o.  separated, father of 60, grandfather one grandchild who works as a Systems developer. He was referred by Dr.McKeownfor peripheral vascular evaluation because of life limiting claudication.I last saw him in the office6/05/2019.His risk factors include treated hyperlipidemia and 45 pack years of tobacco abuse continued to smoke one pack per day. Never had a heart attack or stroke. He does complain of some dyspnea on exertion probably related to COPD. He's had A. fib ablation back in 2011 by Dr. Caryl Comes but has not followed up since. He has complained of less limiting claudication over the last year which is symmetric and bilateral. He can walk one block after which she has to stop because of discomfort. He has had lower extremity arterial Doppler studies in our office 04/11/17 revealing a high-frequency signal in his right common iliac artery and a total left SFA. I performed peripheral angiography on him 04/27/17 revealing a 99% calcified eccentric proximal right common iliac artery stenosis and bilateral long segment SFA chronic total occlusions which were highly calcified. I performed direct back orbital rotational atherectomy, PTCA and cover stenting using a 7 mm x 39 mm long VBX covered stent. Excellent angiographic result. The claudication has somewhat improved as has his Doppler studies. I do not think his SFAs are easily percutaneously addressable.  Since I saw him a year ago he does complain of some continued claudication.  He continues to smoke as well.  He also complains of some shortness of breath.    He had Dopplers performed 12/21/2019 revealing ABIs of 0.7 range bilaterally with a patent right iliac stent.  I believe he would benefit from the addition of Pletal.  We will explore this with our Pharm.D. to  determine if there are any drug drug interactions.  He is also not at goal on high-dose atorvastatin and may benefit from being on a PCSK9 inhibitor as well.     Current Meds  Medication Sig  . ALPRAZolam (XANAX) 1 MG tablet Take 1/2-1 tablet 2 - 3 x /day ONLY if needed for Anxiety Attack &  limit to 5 days /week to avoid addiction (Patient taking differently: Take 1 mg by mouth daily as needed for anxiety. )  . atorvastatin (LIPITOR) 80 MG tablet Take 1 tablet Daily for Cholesterol (Patient taking differently: Take 80 mg by mouth daily. )  . Cholecalciferol 250 MCG (10000 UT) CAPS Take 10,000 Units by mouth daily.   Marland Kitchen dofetilide (TIKOSYN) 500 MCG capsule Take 1 capsule (500 mcg total) by mouth 2 (two) times daily.  Marland Kitchen isoniazid (NYDRAZID) 300 MG tablet Take 1 tablet (300 mg total) by mouth daily.  Marland Kitchen ketoconazole (NIZORAL) 2 % cream Apply to rash 1 to 2 x /day (Patient taking differently: Apply 1 application topically daily as needed (Rash). )  . pyridOXINE (VITAMIN B-6) 100 MG tablet Take 100 mg by mouth daily.  . rivaroxaban (XARELTO) 20 MG TABS tablet Take 1 tablet Daily to Prevent Blood Clots (Patient taking differently: Take 20 mg by mouth at bedtime. )  . triamcinolone cream (KENALOG) 0.5 % Apply 1 application topically 2 (two) times daily. (Patient taking differently: Apply 1 application topically daily as needed (rash). )     Allergies  Allergen Reactions  . Cymbalta [Duloxetine Hcl]   . Effexor [Venlafaxine]  Feels bad  . Wellbutrin [Bupropion]     sleepy  . Zoloft [Sertraline Hcl]     Felt bad    Social History   Socioeconomic History  . Marital status: Divorced    Spouse name: Not on file  . Number of children: 1  . Years of education: Not on file  . Highest education level: Not on file  Occupational History  . 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 Smoking status: Current Every Day Smoker    Packs/day: 0.50    Years: 50.00    Pack years: 25.00     Types: Cigarettes    Start date: 83  . Smokeless tobacco: Never Used  . Tobacco comment: 1/2 pack per day 02/29/20 [facility]  Vaping Use  . Vaping Use: Never used  Substance and Sexual Activity  . Alcohol use: Yes    Alcohol/week: 2.0 standard drinks    Types: 2 Standard drinks or equivalent per week  . Drug use: Never  . Sexual activity: Not on file  Other Topics Concern  . Not on file  Social History Narrative  . Not on file   Social Determinants of Health   Financial Resource Strain:   . Difficulty of Paying Living Expenses: Not on file  Food Insecurity:   . Worried About Charity fundraiser in the Last Year: Not on file  . Ran Out of Food in the Last Year: Not on file  Transportation Needs:   . Lack of Transportation (Medical): Not on file  . Lack of Transportation (Non-Medical): Not on file  Physical Activity:   . Days of Exercise per Week: Not on file  . Minutes of Exercise per Session: Not on file  Stress:   . Feeling of Stress : Not on file  Social Connections:   . Frequency of Communication with Friends and Family: Not on file  . Frequency of Social Gatherings with Friends and Family: Not on file  . Attends Religious Services: Not on file  . Active Member of Clubs or Organizations: Not on file  . Attends Archivist Meetings: Not on file  . Marital Status: Not on file  Intimate Partner Violence:   . Fear of Current or Ex-Partner: Not on file  . Emotionally Abused: Not on file  . Physically Abused: Not on file  . Sexually Abused: Not on file     Review of Systems: General: negative for chills, fever, night sweats or weight changes.  Cardiovascular: negative for chest pain, dyspnea on exertion, edema, orthopnea, palpitations, paroxysmal nocturnal dyspnea or shortness of breath Dermatological: negative for rash Respiratory: negative for cough or wheezing Urologic: negative for hematuria Abdominal: negative for nausea, vomiting, diarrhea, bright red blood  per rectum, melena, or hematemesis Neurologic: negative for visual changes, syncope, or dizziness All other systems reviewed and are otherwise negative except as noted above.    Blood pressure 124/62, pulse 83, height 6' 1.5" (1.867 m), weight 187 lb (84.8 kg).  General appearance: alert and no distress Neck: no adenopathy, no carotid bruit, no JVD, supple, symmetrical, trachea midline and thyroid not enlarged, symmetric, no tenderness/mass/nodules Lungs: clear to auscultation bilaterally Heart: regular rate and rhythm, S1, S2 normal, no murmur, click, rub or gallop Extremities: extremities normal, atraumatic, no cyanosis or edema Pulses: 2+ and symmetric Absent pedal pulses Skin: Skin color, texture, turgor normal. No rashes or lesions Neurologic: Alert and oriented X 3, normal strength and tone. Normal symmetric reflexes. Normal coordination and gait  EKG sinus rhythm 83 with right bundle branch block.  I personally reviewed this EKG.  ASSESSMENT AND PLAN:   TOBACCO ABUSE Ongoing tobacco abuse of 1 pack/day recalcitrant to receptor modification.  Atrial fibrillation ([place]) History of a flutter ablation back in 2011.  Multiple cardioversions since currently on Tikosyn maintaining sinus rhythm on Xarelto oral anticoagulation.  Hyperlipidemia, mixed History of hyperlipidemia on high-dose atorvastatin with lipid profile performed 04/30/2020 revealing total cholesterol of 200, LDL of 131 and HDL of 43.  I am going to refer him to our Pharm.D. to initiate Repatha therapy.  Labile hypertension History of labile hypertension blood pressure measured today 124/62.  He is currently not on antihypertensive medications.  Claudication in peripheral vascular disease ([place]) History of PAD status post orbital atherectomy, PTA and covered stenting of a 99% calcified proximal right common iliac artery stenosis by myself 04/27/2017.  He did have a 40% left common iliac artery stenosis with only a  centimeter gradient as well as occluded SFAs bilaterally with three-vessel runoff.  I did not think his left SFA for percutaneous addressable.  He does continue to complain of lifestyle limiting claudication.  Recent Doppler studies performed 12/21/2019 revealed ABIs in the 0.7 range bilaterally with a patent right common iliac artery stent.      Lorretta Harp MD FACP,FACC,FAHA, [facility] 06/06/2020 11:59 AM

## 2020-06-06 NOTE — Assessment & Plan Note (Signed)
Ongoing tobacco abuse of 1 pack/day recalcitrant to receptor modification. 

## 2020-06-06 NOTE — Assessment & Plan Note (Signed)
History of hyperlipidemia on high-dose atorvastatin with lipid profile performed 04/30/2020 revealing total cholesterol of 200, LDL of 131 and HDL of 43.  I am going to refer him to our Pharm.D. to initiate Repatha therapy.

## 2020-06-06 NOTE — Assessment & Plan Note (Signed)
History of PAD status post orbital atherectomy, PTA and covered stenting of a 99% calcified proximal right common iliac artery stenosis by myself 04/27/2017.  He did have a 40% left common iliac artery stenosis with only a centimeter gradient as well as occluded SFAs bilaterally with three-vessel runoff.  I did not think his left SFA for percutaneous addressable.  He does continue to complain of lifestyle limiting claudication.  Recent Doppler studies performed 12/21/2019 revealed ABIs in the 0.7 range bilaterally with a patent right common iliac artery stent.

## 2020-06-06 NOTE — Patient Instructions (Signed)
Medication Instructions:  Your physician recommends that you continue on your current medications as directed. Please refer to the Current Medication list given to you today.  *If you need a refill on your cardiac medications before your next appointment, please call your pharmacy*  Follow-Up: At [facility], you and your health needs are our priority.  As part of our continuing mission to provide you with exceptional heart care, we have created designated Provider Care Teams.  These Care Teams include your primary Cardiologist (physician) and Advanced Practice Providers (APPs -  Physician Assistants and Nurse Practitioners) who all work together to provide you with the care you need, when you need it.  We recommend signing up for the patient portal called "MyChart".  Sign up information is provided on this After Visit Summary.  MyChart is used to connect with patients for Virtual Visits (Telemedicine).  Patients are able to view lab/test results, encounter notes, upcoming appointments, etc.  Non-urgent messages can be sent to your provider as well.   To learn more about what you can do with MyChart, go to [email or web address].    Your next appointment:   3 month(s)  The format for your next appointment:   In Person  Provider:   Quay Burow, MD   Other Instructions We will make an appointment for you to see a pharmacist at this office to do a medication reconciliation to explore pletal and repatha.

## 2020-06-06 NOTE — Assessment & Plan Note (Signed)
History of a flutter ablation back in 2011.  Multiple cardioversions since currently on Tikosyn maintaining sinus rhythm on Xarelto oral anticoagulation.

## 2020-06-07 ENCOUNTER — Ambulatory Visit (INDEPENDENT_AMBULATORY_CARE_PROVIDER_SITE_OTHER): Payer: PPO | Admitting: Pharmacist Clinician (PhC)/ Clinical Pharmacy Specialist

## 2020-06-07 DIAGNOSIS — I739 Peripheral vascular disease, unspecified: Secondary | ICD-10-CM | POA: Diagnosis not present

## 2020-06-07 DIAGNOSIS — E782 Mixed hyperlipidemia: Secondary | ICD-10-CM

## 2020-06-07 DIAGNOSIS — F17200 Nicotine dependence, unspecified, uncomplicated: Secondary | ICD-10-CM | POA: Diagnosis not present

## 2020-06-07 MED ORDER — NICOTINE 7 MG/24HR TD PT24: 7.0000 mg | MEDICATED_PATCH | Freq: Every day | TRANSDERMAL | 0 refills | Status: DC

## 2020-06-07 MED ORDER — NICOTINE 21 MG/24HR TD PT24: 21.0000 mg | MEDICATED_PATCH | Freq: Every day | TRANSDERMAL | 0 refills | Status: DC

## 2020-06-07 MED ORDER — NICOTINE 14 MG/24HR TD PT24: 14.0000 mg | MEDICATED_PATCH | Freq: Every day | TRANSDERMAL | 0 refills | Status: DC

## 2020-06-07 NOTE — Progress Notes (Signed)
06/08/2020 Bradley Ferguson 1950/09/27 [id number]   HPI:  Bradley Ferguson is a 69 y.o. male patient of Dr Bradley Ferguson, who presents today for a lipid clinic evaluation.  See pertinent past medical history below.  Dr. Allyson Ferguson saw him in the office yesterday and would like to consider the addition of cilostazol, however was concerned about possible drug interactions.  He also was concerned about elevated LDL despite maximum dose atorvastatin.  Lastly, patient is asking about smoking cessation options available.    Patient reports compliance with his medications, but has concerns about adding anything else in for fears of drug interactions.  He currently smokes about 1 pack per day, and is interested in quitting.  He notes that previously he had been down to about 1/2 ppd, but with recent stresses, has increased back to a full pack.    Only concerning drug interaction is that cilostazol can increase AUC of dofetilide, potentially increasing QTc interval.  Patient started dofetilide in October 2019 and his QTc has been steady in the 490 range since that time.  Was 491 in the office yesterday.     Past Medical History: PAD Stent to proximal right common iliac, SFAs not easily percutaneously addressable; ABI 0.7 bilaterally  hypertension Currently well controlled  Atrial fibrillation CHADS2-VASc score 3 (age, htn, pad), on Xarelto 20, dofetilide 500 bid  Tobacco abuse Currently about 1 ppd    Current Medications: atorvastatin 80 mg; alprazolam 1 mg, dofetilide 500 mcg, isoniazid 300 mg rivaroxaban 20 mg  Cholesterol Goals: LDL < 70   Labs: 11/21: TC 200, TG 149, HDL 43, LDL 131   Current Outpatient Medications  Medication Sig Dispense Refill  . ALPRAZolam (XANAX) 1 MG tablet Take 1/2-1 tablet 2 - 3 x /day ONLY if needed for Anxiety Attack &  limit to 5 days /week to avoid addiction (Patient taking differently: Take 1 mg by mouth daily as needed for anxiety.) 90 tablet 0  . atorvastatin (LIPITOR) 80 MG  tablet Take 1 tablet Daily for Cholesterol (Patient taking differently: Take 80 mg by mouth daily.) 90 tablet 1  . Cholecalciferol 250 MCG (10000 UT) CAPS Take 10,000 Units by mouth daily.     Marland Kitchen dofetilide (TIKOSYN) 500 MCG capsule Take 1 capsule (500 mcg total) by mouth 2 (two) times daily. 60 capsule 0  . isoniazid (NYDRAZID) 300 MG tablet Take 1 tablet (300 mg total) by mouth daily. 90 tablet 0  . ketoconazole (NIZORAL) 2 % cream Apply to rash 1 to 2 x /day (Patient taking differently: Apply 1 application topically daily as needed (Rash).) 60 g 1  . nicotine (NICODERM CQ - DOSED IN MG/24 HOURS) 14 mg/24hr patch Place 1 patch (14 mg total) onto the skin daily. 28 patch 0  . nicotine (NICODERM CQ - DOSED IN MG/24 HOURS) 21 mg/24hr patch Place 1 patch (21 mg total) onto the skin daily. 42 patch 0  . nicotine (NICODERM CQ - DOSED IN MG/24 HR) 7 mg/24hr patch Place 1 patch (7 mg total) onto the skin daily. 28 patch 0  . pyridOXINE (VITAMIN B-6) 100 MG tablet Take 100 mg by mouth daily.    . rivaroxaban (XARELTO) 20 MG TABS tablet Take 1 tablet Daily to Prevent Blood Clots (Patient taking differently: Take 20 mg by mouth at bedtime. ) 90 tablet 1  . triamcinolone cream (KENALOG) 0.5 % Apply 1 application topically 2 (two) times daily. (Patient taking differently: Apply 1 application topically daily as needed (rash). ) 80 g  1   No current facility-administered medications for this visit.    Allergies  Allergen Reactions  . Cymbalta [Duloxetine Hcl]   . Effexor [Venlafaxine]     Feels bad  . Wellbutrin [Bupropion]     sleepy  . Zoloft [Sertraline Hcl]     Felt bad    Past Medical History:  Diagnosis Date  . Anxiety   . Atrial fibrillation (HCC)   . Blood transfusion without reported diagnosis 1976  . Cataract   . Depression   . Dyspnea    climbing stairs or walking "in big store- I get short of breathe."  . Dysrhythmia   . Emphysema of lung (HCC)   . Heart murmur   . Hyperlipidemia    . ILD (interstitial lung disease) (HCC)   . PVD (peripheral vascular disease) (HCC)   . Raynaud's syndrome   . Systemic sclerosis (HCC)   . TB lung, latent 12/19/2019    Blood pressure 124/68, pulse 83, resp. rate 14, height 6' 1.5" (1.867 m), weight 189 lb 6.4 oz (85.9 kg), SpO2 93 %.   Claudication in peripheral vascular disease [name] with symptomatic claudication.  Dr. Allyson Ferguson would like to start cilostazol, but concerned as patient on other medications that could be potential drug interactions (dofetilide, isoniazid).  No problems with the isoniazid, however dofetilide can show an increased AUC when mixed with cilostazol.  This could potentially increase QTc.  Patient had EKG yesterday in office, QTC interval was 491.  Will have him start with 50 mg bid, rather than 100 mg bid, and repeat EGK in 3-5 days.  (This was reviewed with Dr. Allyson Ferguson before starting).    Hyperlipidemia, mixed Patient with elevated LDL and PAD, not at goal despite compliance with atorvastatin 80 mg daily.  Patient denies compliance problems, and with LDL at 131, would not reach goal with addition of ezetimibe.  Reviewed options for lowering LDL cholesterol, including PCSK-9 inhibitors and bempedoic acid.  Discussed mechanisms of action, dosing, side effects and potential decreases in LDL cholesterol.  Answered all patient questions.  Based on this information, patient would prefer to start Repatha SureClick 140 mg.  Taught injection technique in office.  Will notify patient once prior authorization approved   TOBACCO ABUSE Patient with long history of tobacco abuse, currently smoking 1 ppd.  Currently in process of moving, as his current apartment does not allow smokers.  Would like to quit and previously was down to 1/2 ppd, but with stress has increased again.  Chantix is currently unavailable due to manufacturing issues and patient not interested in another oral medication.  Reviewed nicotine replacement options  (patches, lozenges, gum).  He would prefer to start with the patches.  Sent prescription to his pharmacy, he will compare that price with Good Rx price before picking up.     Phillips Hay PharmD CPP [facility] [address] [place], [place] [address] [phone]

## 2020-06-07 NOTE — Patient Instructions (Addendum)
Your Results:             Your most recent labs Goal  Total Cholesterol 200 < 200  Triglycerides 149 < 150  HDL (happy/good cholesterol) 43 > 40  LDL (lousy/bad cholesterol 131 < 70     Medication changes:  I will check your EKG today, and if all is good, we will call you and let you know to start the cilostazol (Good Rx price ~ $18/month, better pricing with 3 month supply)  We will start the prior authorization for Repatha 140 mg injections.  Once approved, use 1 pen every 14 days.   Nicotine patches - Good Rx  -21 mg for 42 patches (6 weeks) ~ $30.00   Because of liver issues: Acetaminophen (Tylenol) is okay to take occasionally, but not on a regular basis.  You can use Aleve or ibuprofen more frequently if needed.  Lab orders:  After you have been on the [place] for 2-3 months (about 5-6 doses), we will have you repeat your cholesterol blood work   Patient Assistance:  The [employer] foundation offers assistance to help pay for medication copays.  They will cover copays for all cholesterol lowering meds, including statins, fibrates, omega-3 oils, ezetimibe, Repatha, Praluent, Nexletol, Nexlizet.  The cards are usually good for $2,500 or 12 months, whichever comes first. 1. Go to healthwellfoundation.org 2. Click on "Apply Now" 3. Answer questions as to whom is applying (patient or representative) 4. Your disease fund will be "hypercholesterolemia - Medicare access" 5. They will ask questions about finances and which medications you are taking for cholesterol 6. When you submit, the approval is usually within minutes.  You will need to print the card information from the site 7. You will need to show this information to your pharmacy, they will bill your Medicare Part D plan first -then bill [employer] --for the copay.   You can also call them at [phone], although the hold times can be quite long.   Thank you for choosing [facility]  x

## 2020-06-08 ENCOUNTER — Other Ambulatory Visit: Payer: Self-pay | Admitting: Pharmacist Clinician (PhC)/ Clinical Pharmacy Specialist

## 2020-06-08 MED ORDER — CILOSTAZOL 50 MG PO TABS: 50.0000 mg | ORAL_TABLET | Freq: Two times a day (BID) | ORAL | 3 refills | Status: DC

## 2020-06-08 NOTE — Assessment & Plan Note (Signed)
Patient with elevated LDL and PAD, not at goal despite compliance with atorvastatin 80 mg daily.  Patient denies compliance problems, and with LDL at 131, would not reach goal with addition of ezetimibe.  Reviewed options for lowering LDL cholesterol, including PCSK-9 inhibitors and bempedoic acid.  Discussed mechanisms of action, dosing, side effects and potential decreases in LDL cholesterol.  Answered all patient questions.  Based on this information, patient would prefer to start Repatha SureClick 300 mg.  Taught injection technique in office.  Will notify patient once prior authorization approved

## 2020-06-08 NOTE — Assessment & Plan Note (Signed)
Patient with symptomatic claudication.  Dr. [name] would like to start cilostazol, but concerned as patient on other medications that could be potential drug interactions (dofetilide, isoniazid).  No problems with the isoniazid, however dofetilide can show an increased AUC when mixed with cilostazol.  This could potentially increase QTc.  Patient had EKG yesterday in office, QTC interval was 491.  Will have him start with 50 mg bid, rather than 100 mg bid, and repeat EGK in 3-5 days.  (This was reviewed with Dr. [name] before starting).

## 2020-06-08 NOTE — Assessment & Plan Note (Signed)
Patient with long history of tobacco abuse, currently smoking 1 ppd.  Currently in process of moving, as his current apartment does not allow smokers.  Would like to quit and previously was down to 1/2 ppd, but with stress has increased again.  Chantix is currently unavailable due to manufacturing issues and patient not interested in another oral medication.  Reviewed nicotine replacement options (patches, lozenges, gum).  He would prefer to start with the patches.  Sent prescription to his pharmacy, he will compare that price with Good Rx price before picking up.

## 2020-06-11 ENCOUNTER — Ambulatory Visit: Payer: PPO

## 2020-06-11 NOTE — Progress Notes (Signed)
MEDICARE ANNUAL WELLNESS VISIT AND FOLLOW UP Assessment:    Kirklin was seen today for follow-up and medicare wellness.  Diagnoses and all orders for this visit:  Encounter for Medicare annual wellness exam  Labile hypertension Continue medication Monitor blood pressure at home; call if consistently over 130/80 Continue DASH diet.   Reminder to go to the ER if any CP, SOB, nausea, dizziness, severe HA, changes vision/speech, left arm numbness and tingling and jaw pain.  Paroxysmal atrial fibrillation (HCC) Rate controlled today, doing well on tikosyn, xarelto  Has followed up with cardiology for tikosyn levels Followed by Dr. [name]   Raynaud's disease without gangrene Improved recently, continue to monitor   BPH Recently well controlled, denies symptoms  Vitamin D deficiency Continue supplementation- taking 10000 IU daily  Check vitamin D level -     VITAMIN D 25 Hydroxy (Vit-D Deficiency, Fractures)  TOBACCO ABUSE Discussed risks associated with tobacco use and advised to reduce or quit Patient is ready to do so and plans to slow taper  Declines Chantix or other medication, has nicotine patches Will follow up at the next visit Continue annual low dose CT lung screening; ? pulm may complete; currently UTD   COPD/emphysema ([place]) Stop smoking, pulm following; given breztri samples, he will follow up with pulm, consider advair/neb meds if cost prohibitive   ILD ([place]) Following pulm/rheum; pending imuran due to TB and elevated LFTs  Prediabetes Discussed disease and risks Discussed diet/exercise, weight management  Reminded to schedule appointment with ophthalmology -     Hemoglobin A1c  Hyperlipidemia, mixed Continue medications: on atorvastatin, pending repatha Continue low cholesterol diet and exercise.  Check lipid panel.  -     Lipid panel -     TSH  Claudication in peripheral vascular disease (HCC) Followed by Dr. [name], s/p stent, continued claudication,  medical treatment, - pending pletal and repatha Recommended to quit smoking, regular exercise  Bipolar I disorder ([place]) Currently stable off of medications Lifestyle discussed: diet/exerise, sleep hygiene, stress management, hydration  Attention deficit disorder, unspecified hyperactivity presence Currently stable, doing well at current job off of medications  ANXIETY DEPRESSION Well managed by current regimen; continue medications- using benzo sparingly Stress management techniques discussed, increase water, good sleep hygiene discussed, increase exercise, and increase veggies.   BMI 26.0-26.9,adult Long discussion about weight loss, diet, and exercise Recommended diet heavy in fruits and veggies and low in animal meats, cheeses, and dairy products, appropriate calorie intake Discussed appropriate weight for height Follow up at next visit  Medication management -     CBC with Differential/Platelet -     COMPLETE METABOLIC PANEL WITH GFR -     Magnesium  Colon polyps Recommended 6 month follow up colonoscopy due to numerous polyps, due 12/2018, referred back to Dr. Henrene Pastor   R foot corn Not resolving despite numeous attempts with cryo, resection, topical salicylic acid therapy Pain limiting activity levels Treatment now complicated by PAD;  Discussed and will refer to podiatry for further treatment recommendations due to QOL and risks  Elevated LFTs Benign hx, new, recheck today, negative recent hepatitis, no imaging Check GGT, Iron panel, LFTs If persistently elevated pursue abd [place] ? R/t isoniazid - if not trending up further, monitor and recheck after completion of treatment    Over 30 minutes of exam, counseling, chart review, and critical decision making was performed  Future Appointments  Date Time Provider [place]  06/12/2020  1:15 PM CVD-N[facility] NURSE [facility]  06/18/2020  3:15 PM Tommy Medal, Lavell Islam, MD [facility]  09/04/2020 11:15 AM Lorretta Harp, MD [facility]  11/12/2020 10:30 AM [name], MD [facility] None  05/08/2021  2:00 PM [name], MD [facility] None  06/12/2021 11:15 AM Liane Comber, NP [facility] None     Plan:   During the course of the visit the patient was educated and counseled about appropriate screening and preventive services including:    Pneumococcal vaccine   Influenza vaccine  Prevnar 13  Td vaccine  Screening electrocardiogram  Colorectal cancer screening  Diabetes screening  Glaucoma screening  Nutrition counseling    Subjective:  Bradley Ferguson is a 69 y.o. male who presents for Medicare Annual Wellness Visit and 3 month follow up for HTN, hyperlipidemia, prediabetes, and vitamin D Def.   Today he is concerned about painful wart/corn to R lateral foot, painful with weight bearing, persistent/recurrent despite cryo/resection, salicylic acid treatments, interested in podiatry referral today.   He does have hx of mixed bipolar type depression with ADD and anxiety though in recently years has been apparently stable on xanax, recently taking rarely, averages 1/2 tab 2-3 times a week. He reports doing well with current regimen, trying to taper off.   Patient was recently dx'd by Rheumatology (Dr Estanislado Pandy) with limited systemic sclerosis with intentions to start Imuran when he has completed 2 months of INH for a positive QuantiFERON test, however holding on due to elevated LFTs, continues with isoniazid.  High-resolution Chest CT in Jan found basilar fibrotic interstitial lung disease with probable UIP & patient has also been evaluated by Pulmonary by Dr Vaughan Browner.   he currently continues to smoke 0.75 pack a day, 50 pack year history; discussed risks associated with smoking, patient is not ready to quit, but slowly tapering down. He had CT lung 01/19/2020.   He had first colonoscopy 06/2018 with numerous adenomatous polyps of colon ~20, was recommended a 6 month  follow up colonoscopy by Dr. Henrene Pastor.   BMI is Body mass index is 24.21 kg/m., he has been working on diet, exercise limited by cardiovascular disease (PAD/claudication).  Wt Readings from Last 3 Encounters:  06/12/20 186 lb (84.4 kg)  06/07/20 189 lb 6.4 oz (85.9 kg)  06/06/20 187 lb (84.8 kg)   Hx/o pAfib s/p ablation in 2011 and was seen in 2019 for CPE and was back in Afib and started on Xarelto, and was admitted for initiation of Tikosyn with successful conversion in Oct 2019. He is followed by Dr. Rayann Heman and also Dr. [name] for PVD with claudification, peripheral angiography on  04/27/17 revealing a 99% calcified eccentric proximal right common iliac artery stenosis and bilateral long segment SFA chronic total occlusions which were highly calcified. Pt underwent direct back orbital rotational atherectomy, PTCA and cover stenting, however with persistent/recurrent sx, managed medically.   His blood pressure has been controlled at home, today their BP is BP: 126/70 He does not workout. He denies chest pain, shortness of breath, dizziness.   Chest CT scan on 01/20/2019  Showed Aortic Atherosclerosis.   He is on cholesterol medication (atorvastatin 80 mg daily) and denies myalgias. His cholesterol is not at goal. He was recently evaluated by lipid clinic for consideration of repatha, pending insurance approval at lipid clinic.The cholesterol last visit was:   Lab Results  Component Value Date   CHOL 200 (H) 04/30/2020   HDL 43 04/30/2020   LDLCALC 131 (H) 04/30/2020   TRIG 149 04/30/2020   CHOLHDL 4.7  04/30/2020   He has not been working on diet and exercise for prediabetes, and denies increased appetite, nausea, polydipsia, polyuria, visual disturbances, vomiting and weight loss. Last A1C in the office was:  Lab Results  Component Value Date   HGBA1C 5.7 (H) 04/30/2020   Last GFR Lab Results  Component Value Date   GFRNONAA 76 04/30/2020   Patient is on Vitamin D supplement, taking  10000 IU daily    Lab Results  Component Value Date   VD25OH 65 04/30/2020      New LFT elevation at last labs 04/30/2020, no hx of elevation on review, normal hepatitis panel earlier this year, no recent iron, no recent imaging for liver; recently on isoniazid due to latent TB via ID Lab Results  Component Value Date   ALT 68 (H) 04/30/2020   AST 50 (H) 04/30/2020   ALKPHOS 73 08/10/2017   BILITOT 0.9 04/30/2020   Hepatitis Latest Ref Rng & Units 11/24/2019  Hep B Surface Ag NON-REACTI NON-REACTIVE  Hep B IgM NON-REACTI NON-REACTIVE  Hep C Ab NON-REACTI NON-REACTIVE  Hep C Ab NON-REACTI NON-REACTIVE   No results found for: IRON, TIBC, FERRITIN   Medication Review:   Current Outpatient Medications (Cardiovascular):  .  atorvastatin (LIPITOR) 80 MG tablet, Take 1 tablet Daily for Cholesterol (Patient taking differently: Take 80 mg by mouth daily.) .  dofetilide (TIKOSYN) 500 MCG capsule, Take 1 capsule (500 mcg total) by mouth 2 (two) times daily.    Current Outpatient Medications (Hematological):  .  rivaroxaban (XARELTO) 20 MG TABS tablet, Take 1 tablet Daily to Prevent Blood Clots (Patient taking differently: Take 20 mg by mouth at bedtime.) .  cilostazol (PLETAL) 50 MG tablet, Take 1 tablet (50 mg total) by mouth 2 (two) times daily. (Patient not taking: Reported on 06/12/2020)  Current Outpatient Medications (Other):  Marland Kitchen  ALPRAZolam (XANAX) 1 MG tablet, Take 1/2-1 tablet 2 - 3 x /day ONLY if needed for Anxiety Attack &  limit to 5 days /week to avoid addiction (Patient taking differently: Take 1 mg by mouth daily as needed for anxiety.) .  Cholecalciferol 250 MCG (10000 UT) CAPS, Take 10,000 Units by mouth daily.  Marland Kitchen  isoniazid (NYDRAZID) 300 MG tablet, Take 1 tablet (300 mg total) by mouth daily. Marland Kitchen  ketoconazole (NIZORAL) 2 % cream, Apply to rash 1 to 2 x /day (Patient taking differently: Apply 1 application topically daily as needed (Rash).) .  pyridOXINE (VITAMIN B-6) 100  MG tablet, Take 100 mg by mouth daily. Marland Kitchen  triamcinolone cream (KENALOG) 0.5 %, Apply 1 application topically 2 (two) times daily. (Patient taking differently: Apply 1 application topically daily as needed (rash).) .  nicotine (NICODERM CQ - DOSED IN MG/24 HOURS) 14 mg/24hr patch, Place 1 patch (14 mg total) onto the skin daily. (Patient not taking: Reported on 06/12/2020) .  nicotine (NICODERM CQ - DOSED IN MG/24 HOURS) 21 mg/24hr patch, Place 1 patch (21 mg total) onto the skin daily. (Patient not taking: Reported on 06/12/2020) .  nicotine (NICODERM CQ - DOSED IN MG/24 HR) 7 mg/24hr patch, Place 1 patch (7 mg total) onto the skin daily. (Patient not taking: Reported on 06/12/2020)  Allergies: Allergies  Allergen Reactions  . Cymbalta [Duloxetine Hcl]   . Effexor [Venlafaxine]     Feels bad  . Wellbutrin [Bupropion]     sleepy  . Zoloft [Sertraline Hcl]     Felt bad    Current Problems (verified) has ANXIETY DEPRESSION; TOBACCO ABUSE; RAYNAUDS  SYNDROME; Attention deficit disorder; Dental caries; Bipolar I disorder ([place]); Atrial fibrillation ([place]); Hyperlipidemia, mixed; Vitamin D deficiency; Labile hypertension; Other abnormal glucose (prediabetes); BPH; Claudication in peripheral vascular disease ([place]); Visit for monitoring Tikosyn therapy; Colon polyps; Acquired thrombophilia ([place]); Atherosclerosis of aorta ([place]); Chronic obstructive pulmonary disease ([place]); Centrilobular emphysema ([place]); Interstitial pulmonary disease ([place]); Positive ANA (antinuclear antibody); TB lung, latent; Systemic scleroses ([place]); Right inguinal hernia; and Essential hypertension on their problem list.  Screening Tests Immunization History  Administered Date(s) Administered  . Influenza Whole 04/30/2010  . Influenza, High Dose Seasonal PF 03/20/2017, 03/29/2018, 04/06/2019, 04/30/2020  . Moderna SARS-COV2 Booster Vaccination 05/12/2020  . Moderna Sars-Covid-2 Vaccination 05/12/2020  . PFIZER SARS-COV-2  Vaccination 08/04/2019, 08/29/2019  . Pneumococcal Conjugate-13 02/05/2017  . Pneumococcal Polysaccharide-23 04/30/2010, 03/29/2018  . Td 06/30/2013    Preventative care: Last colonoscopy: 06/2018, multiple polyps, follow up 6 months, he will call Dr. Henrene Pastor  CXR: 2011, getting every 2 years through work CT lung 12/2019  Prior vaccinations: TD or Tdap: 2015  Influenza: 04/2020  Pneumococcal: 2019 Prevnar13: 2018 Shingles/Zostavax: declines  Covid 19: 3/3, 2021  Names of Other Physician/Practitioners you currently use: 1. [facility] here for primary care 2. Dr. Katy Fitch, eye doctor, last visit 2021, monitoring L cataract 3. Full dentures   Patient Care Team: [name], MD as PCP - General (Internal Medicine) Constance Haw, MD as PCP - Electrophysiology (Cardiology) Lorretta Harp, MD as Consulting Physician (Cardiology) Irene Shipper, MD as Consulting Physician (Gastroenterology) Warden Fillers, MD as Consulting Physician (Ophthalmology)  Surgical: He  has a past surgical history that includes Atrial fibrillation ablation; LOWER EXTREMITY INTERVENTION (N/A, 04/27/2017); PERIPHERAL VASCULAR INTERVENTION (Right, 04/27/2017); PERIPHERAL VASCULAR ATHERECTOMY (Right, 04/27/2017); Cataract extraction (Right); Eye surgery; Colonoscopy; Fracture surgery (Left, 1976); Inguinal hernia repair (Right, 03/23/2020); and Insertion of mesh (Right, 03/23/2020). Family His family history includes Cancer in his father and mother. Social history  He reports that he has been smoking cigarettes. He started smoking about 50 years ago. He has a 37.50 pack-year smoking history. He has never used smokeless tobacco. He reports current alcohol use of about 2.0 standard drinks of alcohol per week. He reports that he does not use drugs.  MEDICARE WELLNESS OBJECTIVES: Physical activity: Current Exercise Habits: The patient does not participate in regular  exercise at present, Exercise limited by: Other - see comments (R foot corn limiting) Cardiac risk factors: Cardiac Risk Factors include: advanced age (>52men, >61 women);dyslipidemia;hypertension;sedentary lifestyle;male gender;smoking/ tobacco exposure Depression/mood screen:   Depression screen [facility] 2/9 06/12/2020  Decreased Interest 0  Down, Depressed, Hopeless 1  PHQ - 2 Score 1  Altered sleeping 1  Tired, decreased energy 1  Change in appetite 0  Feeling bad or failure about yourself  0  Trouble concentrating 0  Moving slowly or fidgety/restless 0  Suicidal thoughts 0  PHQ-9 Score 3  Difficult doing work/chores Not difficult at all    ADLs:  In your present state of health, do you have any difficulty performing the following activities: 06/12/2020 04/29/2020  Hearing? N N  Vision? N N  Difficulty concentrating or making decisions? N N  Walking or climbing stairs? N N  Dressing or bathing? N N  Doing errands, shopping? N N  Some recent data might be hidden     Cognitive Testing  Alert? Yes  Normal Appearance?Yes  Oriented to person? Yes  Place? Yes   Time? Yes  Recall of three objects?  Yes  Can perform simple calculations? Yes  Displays appropriate judgment?Yes  Can read the correct time from a watch face?Yes  EOL planning: Does Patient Have a Medical Advance Directive?: No Does patient want to make changes to medical advance directive?: Yes (MAU/Ambulatory/Procedural Areas - Information given)   Objective:   Today's Vitals   06/12/20 1040  BP: 126/70  Pulse: 82  Temp: 97.9 F (36.6 C)  SpO2: 94%  Weight: 186 lb (84.4 kg)   Body mass index is 24.21 kg/m.  General appearance: alert, no distress, WD/WN, male HEENT: normocephalic, sclerae anicteric, TMs pearly, nares patent, no discharge or erythema, pharynx normal Oral cavity: MMM, no lesions Neck: supple, no lymphadenopathy, no thyromegaly, no masses Heart: RRR, normal S1, S2, no murmurs Lungs: CTA  bilaterally, mildly coarse over bronchioles anteriorly, no wheezes, rhonchi, or rales Abdomen: +bs, soft, non tender, non distended, no masses, no hepatomegaly, no splenomegaly Musculoskeletal: nontender, no swelling, no obvious deformity Extremities: no edema, no cyanosis, no clubbing. R foot with corn to 1st digit base and laterally  Pulses: 2+ symmetric, upper extreamities, LE eripheral pulses thready and without edema. No aortic or femoral bruits. Neurological: alert, oriented x 3, CN2-12 intact, strength normal upper extremities and lower extremities, sensation normal throughout, DTRs 2+ throughout, no cerebellar signs, gait normal Psychiatric: normal affect, behavior normal, pleasant   Medicare Attestation I have personally reviewed: The patient's medical and social history Their use of alcohol, tobacco or illicit drugs Their current medications and supplements The patient's functional ability including ADLs,fall risks, home safety risks, cognitive, and hearing and visual impairment Diet and physical activities Evidence for depression or mood disorders  The patient's weight, height, BMI, and visual acuity have been recorded in the chart.  I have made referrals, counseling, and provided education to the patient based on review of the above and I have provided the patient with a written personalized care plan for preventive services.     Izora Ribas, NP   06/12/2020

## 2020-06-12 ENCOUNTER — Encounter: Payer: Self-pay | Admitting: Adult Health

## 2020-06-12 ENCOUNTER — Telehealth: Payer: Self-pay | Admitting: Cardiovascular Disease

## 2020-06-12 ENCOUNTER — Ambulatory Visit (INDEPENDENT_AMBULATORY_CARE_PROVIDER_SITE_OTHER): Payer: PPO | Admitting: Adult Health

## 2020-06-12 ENCOUNTER — Ambulatory Visit: Payer: PPO

## 2020-06-12 ENCOUNTER — Other Ambulatory Visit: Payer: Self-pay

## 2020-06-12 VITALS — BP 126/70 | HR 82 | Temp 97.9°F | Wt 186.0 lb

## 2020-06-12 DIAGNOSIS — J432 Centrilobular emphysema: Secondary | ICD-10-CM | POA: Diagnosis not present

## 2020-06-12 DIAGNOSIS — J449 Chronic obstructive pulmonary disease, unspecified: Secondary | ICD-10-CM | POA: Diagnosis not present

## 2020-06-12 DIAGNOSIS — I480 Paroxysmal atrial fibrillation: Secondary | ICD-10-CM | POA: Diagnosis not present

## 2020-06-12 DIAGNOSIS — I10 Essential (primary) hypertension: Secondary | ICD-10-CM | POA: Diagnosis not present

## 2020-06-12 DIAGNOSIS — K029 Dental caries, unspecified: Secondary | ICD-10-CM | POA: Diagnosis not present

## 2020-06-12 DIAGNOSIS — F17200 Nicotine dependence, unspecified, uncomplicated: Secondary | ICD-10-CM

## 2020-06-12 DIAGNOSIS — J849 Interstitial pulmonary disease, unspecified: Secondary | ICD-10-CM | POA: Diagnosis not present

## 2020-06-12 DIAGNOSIS — F319 Bipolar disorder, unspecified: Secondary | ICD-10-CM

## 2020-06-12 DIAGNOSIS — I7300 Raynaud's syndrome without gangrene: Secondary | ICD-10-CM | POA: Diagnosis not present

## 2020-06-12 DIAGNOSIS — R7989 Other specified abnormal findings of blood chemistry: Secondary | ICD-10-CM | POA: Diagnosis not present

## 2020-06-12 DIAGNOSIS — I700 Atherosclerosis of aorta: Secondary | ICD-10-CM

## 2020-06-12 DIAGNOSIS — R7309 Other abnormal glucose: Secondary | ICD-10-CM

## 2020-06-12 DIAGNOSIS — M349 Systemic sclerosis, unspecified: Secondary | ICD-10-CM

## 2020-06-12 DIAGNOSIS — R6889 Other general symptoms and signs: Secondary | ICD-10-CM

## 2020-06-12 DIAGNOSIS — D126 Benign neoplasm of colon, unspecified: Secondary | ICD-10-CM

## 2020-06-12 DIAGNOSIS — I739 Peripheral vascular disease, unspecified: Secondary | ICD-10-CM | POA: Diagnosis not present

## 2020-06-12 DIAGNOSIS — Z227 Latent tuberculosis: Secondary | ICD-10-CM | POA: Diagnosis not present

## 2020-06-12 DIAGNOSIS — E782 Mixed hyperlipidemia: Secondary | ICD-10-CM

## 2020-06-12 DIAGNOSIS — Z0001 Encounter for general adult medical examination with abnormal findings: Secondary | ICD-10-CM | POA: Diagnosis not present

## 2020-06-12 DIAGNOSIS — N320 Bladder-neck obstruction: Secondary | ICD-10-CM

## 2020-06-12 DIAGNOSIS — Z0000 Encounter for general adult medical examination without abnormal findings: Secondary | ICD-10-CM

## 2020-06-12 DIAGNOSIS — L84 Corns and callosities: Secondary | ICD-10-CM

## 2020-06-12 DIAGNOSIS — F341 Dysthymic disorder: Secondary | ICD-10-CM

## 2020-06-12 DIAGNOSIS — D6869 Other thrombophilia: Secondary | ICD-10-CM | POA: Diagnosis not present

## 2020-06-12 DIAGNOSIS — E559 Vitamin D deficiency, unspecified: Secondary | ICD-10-CM

## 2020-06-12 DIAGNOSIS — F988 Other specified behavioral and emotional disorders with onset usually occurring in childhood and adolescence: Secondary | ICD-10-CM

## 2020-06-12 NOTE — Telephone Encounter (Signed)
Spoke with patient, will get medication sometime Wednesday.  Moved EKG visit to next Monday Dec 20, pt agreeable with changes

## 2020-06-12 NOTE — Telephone Encounter (Signed)
Bradley Ferguson is calling due to having to cancel his nurse visit with Erasmo Downer for today due to the medication she is wanting him to take not being available to pick up from the pharmacy until tomorrow. He is requesting a callback to reschedule. Please advise.

## 2020-06-12 NOTE — Patient Instructions (Addendum)
Bradley Ferguson , Thank you for taking time to come for your Medicare Wellness Visit. I appreciate your ongoing commitment to your health goals. Please review the following plan we discussed and let me know if I can assist you in the future.   These are the goals we discussed: Goals    . Cut out extra servings    . Increase physical activity    . Increase water intake    . Plan meals    . Quit Smoking       This is a list of the screening recommended for you and due dates:  Health Maintenance  Topic Date Due  . Colon Cancer Screening  07/24/2019  . Flu Shot  01/29/2020  . COVID-19 Vaccine (4 - Booster) 11/09/2020  . Tetanus Vaccine  07/01/2023  .  Hepatitis C: One time screening is recommended by Center for Disease Control  (CDC) for  adults born from [birth year] through 1964.   Completed  . Pneumonia vaccines  Completed       Corns and Calluses Corns are small areas of thickened skin that occur on the top, sides, or tip of a toe. They contain a cone-shaped core with a point that can press on a nerve below. This causes pain.  Calluses are areas of thickened skin that can occur anywhere on the body, including the hands, fingers, palms, soles of the feet, and heels. Calluses are usually larger than corns. What are the causes? Corns and calluses are caused by rubbing (friction) or pressure, such as from shoes that are too tight or do not fit properly. What increases the risk? Corns are more likely to develop in people who have misshapen toes (toe deformities), such as hammer toes. Calluses can occur with friction to any area of the skin. They are more likely to develop in people who:  Work with their hands.  Wear shoes that fit poorly, are too tight, or are high-heeled.  Have toe deformities. What are the signs or symptoms? Symptoms of a corn or callus include:  A hard growth on the skin.  Pain or tenderness under the skin.  Redness and swelling.  Increased discomfort while  wearing tight-fitting shoes, if your feet are affected. If a corn or callus becomes infected, symptoms may include:  Redness and swelling that gets worse.  Pain.  Fluid, blood, or pus draining from the corn or callus. How is this diagnosed? Corns and calluses may be diagnosed based on your symptoms, your medical history, and a physical exam. How is this treated? Treatment for corns and calluses may include:  Removing the cause of the friction or pressure. This may involve: ? Changing your shoes. ? Wearing shoe inserts (orthotics) or other protective layers in your shoes, such as a corn pad. ? Wearing gloves.  Applying medicine to the skin (topical medicine) to help soften skin in the hardened, thickened areas.  Removing layers of dead skin with a file to reduce the size of the corn or callus.  Removing the corn or callus with a scalpel or laser.  Taking antibiotic medicines, if your corn or callus is infected.  Having surgery, if a toe deformity is the cause. Follow these instructions at home:   Take over-the-counter and prescription medicines only as told by your health care provider.  If you were prescribed an antibiotic, take it as told by your health care provider. Do not stop taking it even if your condition starts to improve.  Wear shoes that  fit well. Avoid wearing high-heeled shoes and shoes that are too tight or too loose.  Wear any padding, protective layers, gloves, or orthotics as told by your health care provider.  Soak your hands or feet and then use a file or pumice stone to soften your corn or callus. Do this as told by your health care provider.  Check your corn or callus every day for symptoms of infection. Contact a health care provider if you:  Notice that your symptoms do not improve with treatment.  Have redness or swelling that gets worse.  Notice that your corn or callus becomes painful.  Have fluid, blood, or pus coming from your corn or  callus.  Have new symptoms. Summary  Corns are small areas of thickened skin that occur on the top, sides, or tip of a toe.  Calluses are areas of thickened skin that can occur anywhere on the body, including the hands, fingers, palms, and soles of the feet. Calluses are usually larger than corns.  Corns and calluses are caused by rubbing (friction) or pressure, such as from shoes that are too tight or do not fit properly.  Treatment may include wearing any padding, protective layers, gloves, or orthotics as told by your health care provider. This information is not intended to replace advice given to you by your health care provider. Make sure you discuss any questions you have with your health care provider. Document Revised: 10/06/2018 Document Reviewed: 04/29/2017 Elsevier Patient Education  2020 [employer].

## 2020-06-13 ENCOUNTER — Other Ambulatory Visit: Payer: Self-pay | Admitting: Adult Health

## 2020-06-13 DIAGNOSIS — R7989 Other specified abnormal findings of blood chemistry: Secondary | ICD-10-CM

## 2020-06-13 LAB — COMPLETE METABOLIC PANEL WITH GFR
AG Ratio: 1.8 (calc) (ref 1.0–2.5)
ALT: 51 U/L — ABNORMAL HIGH (ref 9–46)
AST: 45 U/L — ABNORMAL HIGH (ref 10–35)
Albumin: 4.4 g/dL (ref 3.6–5.1)
Alkaline phosphatase (APISO): 82 U/L (ref 35–144)
BUN: 14 mg/dL (ref 7–25)
CO2: 29 mmol/L (ref 20–32)
Calcium: 9.4 mg/dL (ref 8.6–10.3)
Chloride: 103 mmol/L (ref 98–110)
Creat: 0.98 mg/dL (ref 0.70–1.25)
GFR, Est African American: 91 mL/min/{1.73_m2} (ref >=60)
GFR, Est Non African American: 79 mL/min/{1.73_m2} (ref >=60)
Globulin: 2.5 g/dL (calc) (ref 1.9–3.7)
Glucose, Bld: 93 mg/dL (ref 65–99)
Potassium: 4.7 mmol/L (ref 3.5–5.3)
Sodium: 141 mmol/L (ref 135–146)
Total Bilirubin: 0.6 mg/dL (ref 0.2–1.2)
Total Protein: 6.9 g/dL (ref 6.1–8.1)

## 2020-06-13 LAB — IRON,TIBC AND FERRITIN PANEL
%SAT: 24 % (calc) (ref 20–48)
Ferritin: 63 ng/mL (ref 24–380)
Iron: 86 ug/dL (ref 50–180)
TIBC: 359 mcg/dL (calc) (ref 250–425)

## 2020-06-13 LAB — CBC WITH DIFFERENTIAL/PLATELET
Absolute Monocytes: 694 cells/uL (ref 200–950)
Basophils Absolute: 73 cells/uL (ref 0–200)
Basophils Relative: 1 %
Eosinophils Absolute: 190 cells/uL (ref 15–500)
Eosinophils Relative: 2.6 %
HCT: 43.2 % (ref 38.5–50.0)
Hemoglobin: 14.4 g/dL (ref 13.2–17.1)
Lymphs Abs: 1533 cells/uL (ref 850–3900)
MCH: 31.1 pg (ref 27.0–33.0)
MCHC: 33.3 g/dL (ref 32.0–36.0)
MCV: 93.3 fL (ref 80.0–100.0)
MPV: 10.8 fL (ref 7.5–12.5)
Monocytes Relative: 9.5 %
Neutro Abs: 4811 cells/uL (ref 1500–7800)
Neutrophils Relative %: 65.9 %
Platelets: 162 10*3/uL (ref 140–400)
RBC: 4.63 10*6/uL (ref 4.20–5.80)
RDW: 12.7 % (ref 11.0–15.0)
Total Lymphocyte: 21 %
WBC: 7.3 10*3/uL (ref 3.8–10.8)

## 2020-06-13 LAB — GAMMA GT: GGT: 27 U/L (ref 3–70)

## 2020-06-18 ENCOUNTER — Encounter: Payer: Self-pay | Admitting: Infectious Disease

## 2020-06-18 ENCOUNTER — Other Ambulatory Visit: Payer: Self-pay

## 2020-06-18 ENCOUNTER — Ambulatory Visit: Payer: PPO | Admitting: Podiatry

## 2020-06-18 ENCOUNTER — Encounter: Payer: Self-pay | Admitting: Podiatry

## 2020-06-18 ENCOUNTER — Ambulatory Visit: Payer: PPO

## 2020-06-18 ENCOUNTER — Ambulatory Visit: Payer: PPO | Admitting: Infectious Disease

## 2020-06-18 VITALS — BP 130/70 | HR 102 | Temp 98.4°F | Resp 16 | Ht 73.5 in | Wt 187.4 lb

## 2020-06-18 DIAGNOSIS — R7401 Elevation of levels of liver transaminase levels: Secondary | ICD-10-CM

## 2020-06-18 DIAGNOSIS — Z227 Latent tuberculosis: Secondary | ICD-10-CM

## 2020-06-18 DIAGNOSIS — I7300 Raynaud's syndrome without gangrene: Secondary | ICD-10-CM

## 2020-06-18 DIAGNOSIS — L84 Corns and callosities: Secondary | ICD-10-CM | POA: Diagnosis not present

## 2020-06-18 DIAGNOSIS — I999 Unspecified disorder of circulatory system: Secondary | ICD-10-CM

## 2020-06-18 DIAGNOSIS — I480 Paroxysmal atrial fibrillation: Secondary | ICD-10-CM | POA: Diagnosis not present

## 2020-06-18 DIAGNOSIS — M21621 Bunionette of right foot: Secondary | ICD-10-CM | POA: Diagnosis not present

## 2020-06-18 DIAGNOSIS — D689 Coagulation defect, unspecified: Secondary | ICD-10-CM

## 2020-06-18 DIAGNOSIS — M349 Systemic sclerosis, unspecified: Secondary | ICD-10-CM | POA: Diagnosis not present

## 2020-06-18 DIAGNOSIS — M779 Enthesopathy, unspecified: Secondary | ICD-10-CM

## 2020-06-18 DIAGNOSIS — R7989 Other specified abnormal findings of blood chemistry: Secondary | ICD-10-CM

## 2020-06-18 HISTORY — DX: Elevation of levels of liver transaminase levels: R74.01

## 2020-06-18 NOTE — Progress Notes (Signed)
Subjective:   Patient ID: Bradley Ferguson, male   DOB: 69 y.o.   MRN: [id number]   HPI Patient presents with history of vascular disease right and referred by vascular doctor with chronic lesion subfifth metatarsal head right that is been very painful with history of having had it trimmed and work on without relief and admits he still smoking three quarters a pack of cigarettes per day due to stress and he would like to reduce but has not been able to.  He is not able to be active due to pain   Review of Systems  All other systems reviewed and are negative.       Objective:  Physical Exam Vitals and nursing note reviewed.  Constitutional:      Appearance: He is well-developed and well-nourished.  Cardiovascular:     Pulses: Intact distal pulses.  Pulmonary:     Effort: Pulmonary effort is normal.  Musculoskeletal:        General: Normal range of motion.  Skin:    General: Skin is warm.  Neurological:     Mental Status: He is alert.     Vascular status found to be diminished both sharp dull and vibratory and also DP and PT pulses nonpresent.  There is found to be diminished capillary fill time bilateral and on the plantar aspect of the right there is a significant keratotic lesion with inflammation fluid buildup that is very painful when pressed and makes it very hard for him to walk.  It is not ulcerated or broken down but it is irritated to a high degree     Assessment:  Significant at risk patient with severe vascular disease with chronic inflammation capsulitis fifth MPJ right with lesion formation that is painful     Plan:  H&P reviewed condition and treatments at great length.  Surgery is not an option due to his lack of circulation which cannot be fixed and we will get a try to do everything we can locally to keep it from breaking down even though that is a possibility long-term.  I did do a careful block of the area 3 mg dexamethasone half a milligram Kenalog 5 mg  Xylocaine and then I went ahead and I debrided the area and advised on offloading and recommended orthotics to try to offload all weight off this area so that he hopefully will not ulcerate or have other problems.  Understands ultimately that some form of surgery may be necessary or amputation due to his severe vascular disease signed visit

## 2020-06-18 NOTE — Progress Notes (Signed)
Subjective:   Chief complaint: Concern that is latent TB will have been treated adequately and also concerned about his liver function tests have been elevated   Patient ID: Bradley Ferguson, male    DOB: 1951-03-12, 69 y.o.   MRN: [id number]  HPI   Bradley Ferguson is a 69 year old Caucasian man with a history significant for COPD, smoking peripheral vascular disease and interstitial lung disease thought to be due to systemic sclerosis.  He tested positive for QuantiFERON gold consistent with his prior positive TB skin test 25 years ago  Started him on isoniazid and he is now 6 months into a 22-month course.  He did have slight elevation of his transaminases when checked by rheumatology.  They have not yet initiated potent immunosuppressive therapy yet for systemic sclerosis.  Question the patient he does endorse drinking alcohol 1 night a week when he gets together with friends.  The LFT pattern though is not terribly consistent with alcohol consumption itself.  I will asked him to be cautious about alcohol though I also suspect that isoniazid may be causing a bit of mild LFT elevation.    Past Medical History:  Diagnosis Date  . Anxiety   . Atrial fibrillation ([place])   . Blood transfusion without reported diagnosis 1976  . Cataract   . Depression   . Dyspnea    climbing stairs or walking "in big store- I get short of breathe."  . Dysrhythmia   . Emphysema of lung ([place])   . Heart murmur   . Hyperlipidemia   . ILD (interstitial lung disease) ([place])   . PVD (peripheral vascular disease) ([place])   . Raynaud's syndrome   . Systemic sclerosis ([place])   . TB lung, latent 12/19/2019    Past Surgical History:  Procedure Laterality Date  . ATRIAL FIBRILLATION ABLATION    . CATARACT EXTRACTION Right   . COLONOSCOPY    . EYE SURGERY    . FRACTURE SURGERY Left 1976  . INGUINAL HERNIA REPAIR Right 03/23/2020   Procedure: OPEN RIGHT INGUINAL HERNIA REPAIR WITH MESH;  Surgeon: Johnathan Hausen, MD;   Location: [facility];  Service: General;  Laterality: Right;  . INSERTION OF MESH Right 03/23/2020   Procedure: INSERTION OF MESH;  Surgeon: Johnathan Hausen, MD;  Location: [place];  Service: General;  Laterality: Right;  . LOWER EXTREMITY INTERVENTION N/A 04/27/2017   Procedure: LOWER EXTREMITY INTERVENTION;  Surgeon: Lorretta Harp, MD;  Location: [facility];  Service: Cardiovascular;  Laterality: N/A;  . PERIPHERAL VASCULAR ATHERECTOMY Right 04/27/2017   Procedure: PERIPHERAL VASCULAR ATHERECTOMY;  Surgeon: Lorretta Harp, MD;  Location: [facility];  Service: Cardiovascular;  Laterality: Right;  Iliac  . PERIPHERAL VASCULAR INTERVENTION Right 04/27/2017   Procedure: PERIPHERAL VASCULAR INTERVENTION;  Surgeon: Lorretta Harp, MD;  Location: [facility];  Service: Cardiovascular;  Laterality: Right;  Iliac    Family History  Problem Relation Age of Onset  . Cancer Mother        small cell lung cancer  . Cancer Father        unsure type  . Colon cancer Neg Hx   . Rectal cancer Neg Hx       Social History   Socioeconomic History  . Marital status: Divorced    Spouse name: Not on file  . Number of children: 1  . Years of education: Not on file  . Highest education level: Not on file  Occupational History  .  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 Smoking status: Current Every Day Smoker    Packs/day: 0.75    Years: 50.00    Pack years: 37.50    Types: Cigarettes    Start date: 66  . Smokeless tobacco: Never Used  Vaping Use  . Vaping Use: Never used  Substance and Sexual Activity  . Alcohol use: Yes    Alcohol/week: 2.0 standard drinks    Types: 2 Standard drinks or equivalent per week  . Drug use: Never  . Sexual activity: Not on file  Other Topics Concern  . Not on file  Social History Narrative  . Not on file   Social Determinants of Health   Financial Resource Strain: Not on file  Food Insecurity: Not on file   Transportation Needs: Not on file  Physical Activity: Not on file  Stress: Not on file  Social Connections: Not on file    Allergies  Allergen Reactions  . Cymbalta [Duloxetine Hcl]   . Effexor [Venlafaxine]     Feels bad  . Wellbutrin [Bupropion]     sleepy  . Zoloft [Sertraline Hcl]     Felt bad     Current Outpatient Medications:  .  ALPRAZolam (XANAX) 1 MG tablet, Take 1/2-1 tablet 2 - 3 x /day ONLY if needed for Anxiety Attack &  limit to 5 days /week to avoid addiction (Patient taking differently: Take 1 mg by mouth daily as needed for anxiety.), Disp: 90 tablet, Rfl: 0 .  atorvastatin (LIPITOR) 80 MG tablet, Take 1 tablet Daily for Cholesterol (Patient taking differently: Take 80 mg by mouth daily.), Disp: 90 tablet, Rfl: 1 .  Cholecalciferol 250 MCG (10000 UT) CAPS, Take 10,000 Units by mouth daily. , Disp: , Rfl:  .  cilostazol (PLETAL) 50 MG tablet, Take 1 tablet (50 mg total) by mouth 2 (two) times daily., Disp: 60 tablet, Rfl: 3 .  dofetilide (TIKOSYN) 500 MCG capsule, Take 1 capsule (500 mcg total) by mouth 2 (two) times daily., Disp: 60 capsule, Rfl: 0 .  isoniazid (NYDRAZID) 300 MG tablet, Take 1 tablet (300 mg total) by mouth daily., Disp: 90 tablet, Rfl: 0 .  ketoconazole (NIZORAL) 2 % cream, Apply to rash 1 to 2 x /day (Patient taking differently: Apply 1 application topically daily as needed (Rash).), Disp: 60 g, Rfl: 1 .  pyridOXINE (VITAMIN B-6) 100 MG tablet, Take 100 mg by mouth daily., Disp: , Rfl:  .  rivaroxaban (XARELTO) 20 MG TABS tablet, Take 1 tablet Daily to Prevent Blood Clots (Patient taking differently: Take 20 mg by mouth at bedtime.), Disp: 90 tablet, Rfl: 1 .  triamcinolone cream (KENALOG) 0.5 %, Apply 1 application topically 2 (two) times daily. (Patient taking differently: Apply 1 application topically daily as needed (rash).), Disp: 80 g, Rfl: 1   Review of Systems  Constitutional: Negative for activity change, appetite change, chills,  diaphoresis, fatigue, fever and unexpected weight change.  HENT: Negative for congestion, rhinorrhea, sinus pressure, sneezing, sore throat and trouble swallowing.   Eyes: Negative for photophobia and visual disturbance.  Respiratory: Negative for cough, chest tightness, shortness of breath, wheezing and stridor.   Cardiovascular: Negative for chest pain, palpitations and leg swelling.  Gastrointestinal: Negative for abdominal distention, abdominal pain, anal bleeding, blood in stool, constipation, diarrhea, nausea and vomiting.  Genitourinary: Negative for difficulty urinating, dysuria, flank pain and hematuria.  Musculoskeletal: Negative for arthralgias, back pain, gait problem, joint swelling and myalgias.  Skin: Negative for  color change, pallor, rash and wound.  Neurological: Negative for dizziness, tremors, weakness and light-headedness.  Hematological: Negative for adenopathy. Does not bruise/bleed easily.  Psychiatric/Behavioral: Negative for agitation, behavioral problems, confusion, decreased concentration, dysphoric mood and sleep disturbance.       Objective:   Physical Exam Constitutional:      General: He is not in acute distress.    Appearance: Normal appearance. He is well-developed. He is not ill-appearing or diaphoretic.  HENT:     Head: Normocephalic and atraumatic.     Right Ear: Hearing and external ear normal.     Left Ear: Hearing and external ear normal.     Nose: No nasal deformity or rhinorrhea.  Eyes:     General: No scleral icterus.    Conjunctiva/sclera: Conjunctivae normal.     Right eye: Right conjunctiva is not injected.     Left eye: Left conjunctiva is not injected.  Neck:     Vascular: No JVD.  Cardiovascular:     Rate and Rhythm: Normal rate and regular rhythm.     Heart sounds: S1 normal and S2 normal.  Pulmonary:     Effort: Pulmonary effort is normal. No respiratory distress.     Breath sounds: No wheezing or rales.  Abdominal:      General: Bowel sounds are normal. There is no distension.     Palpations: Abdomen is soft.     Tenderness: There is no abdominal tenderness.  Musculoskeletal:        General: Normal range of motion.     Right shoulder: Normal.     Left shoulder: Normal.     Cervical back: Normal range of motion and neck supple.     Right hip: Normal.     Left hip: Normal.     Right knee: Normal.     Left knee: Normal.  Lymphadenopathy:     Head:     Right side of head: No submandibular, preauricular or posterior auricular adenopathy.     Left side of head: No submandibular, preauricular or posterior auricular adenopathy.     Cervical: No cervical adenopathy.     Right cervical: No superficial or deep cervical adenopathy.    Left cervical: No superficial or deep cervical adenopathy.  Skin:    General: Skin is warm and dry.     Coloration: Skin is not pale.     Findings: No abrasion, bruising, ecchymosis, erythema, lesion or rash.     Nails: There is no clubbing.  Neurological:     Mental Status: He is alert and oriented to person, place, and time.     Sensory: No sensory deficit.     Coordination: Coordination normal.     Gait: Gait normal.  Psychiatric:        Attention and Perception: He is attentive.        Mood and Affect: Mood normal.        Speech: Speech normal.        Behavior: Behavior normal. Behavior is cooperative.        Thought Content: Thought content normal.        Judgment: Judgment normal.           Assessment & Plan:  Latent TB: Complete 3 more months of isoniazid  Exam and-itis: Not at the level where one would need to monitor therapy.  They were actually improving last on they were checked  Systemic sclerosis: Follow-up with rheumatology Atrial fibrillation on anticoagulation  Claudication initiated  on cilostazol He can come to our clinic as needed

## 2020-06-19 ENCOUNTER — Ambulatory Visit (INDEPENDENT_AMBULATORY_CARE_PROVIDER_SITE_OTHER): Payer: PPO | Admitting: Pharmacist Clinician (PhC)/ Clinical Pharmacy Specialist

## 2020-06-19 ENCOUNTER — Telehealth: Payer: Self-pay | Admitting: Cardiology

## 2020-06-19 DIAGNOSIS — I480 Paroxysmal atrial fibrillation: Secondary | ICD-10-CM

## 2020-06-19 MED ORDER — DOFETILIDE 500 MCG PO CAPS: 500.0000 ug | ORAL_CAPSULE | Freq: Two times a day (BID) | ORAL | 0 refills | Status: DC

## 2020-06-19 NOTE — Progress Notes (Signed)
Patient was in office for EKG.  Started pletal about 1 week ago.  Already taking Tikosyn 500 mcg bid.    Pletal has potential to increase tikosyn blood concentration, thus causing increase in QTc.   EKG done on Dec 8 shows QTc at 491.  Today is at 553.    Reviewed with Dr. Audie Box in the office today.  Will have patient discontinue pletal.  Will send to Dr. [name] for final review.

## 2020-06-19 NOTE — Telephone Encounter (Signed)
Pt's medication was sent to pt's pharmacy as requested. Confirmation received.  °

## 2020-06-19 NOTE — Telephone Encounter (Signed)
*  STAT* If patient is at the pharmacy, call can be transferred to refill team.   1. Which medications need to be refilled? (please list name of each medication and dose if known)  dofetilide (TIKOSYN) 500 MCG capsule  2. Which pharmacy/location (including street and city if local pharmacy) is medication to be sent to? [facility] [address], [place]  3. Do they need a 30 day or 90 day supply? 90 with refills   Patient said it is cheaper for him to get a 90 day supply than a 30 day supply

## 2020-06-21 ENCOUNTER — Ambulatory Visit (INDEPENDENT_AMBULATORY_CARE_PROVIDER_SITE_OTHER): Payer: PPO | Admitting: Orthotics

## 2020-06-21 ENCOUNTER — Ambulatory Visit: Payer: PPO | Admitting: Student

## 2020-06-21 ENCOUNTER — Encounter: Payer: Self-pay | Admitting: Student

## 2020-06-21 ENCOUNTER — Other Ambulatory Visit: Payer: Self-pay

## 2020-06-21 VITALS — BP 134/64 | HR 88 | Ht 73.5 in | Wt 184.0 lb

## 2020-06-21 DIAGNOSIS — I480 Paroxysmal atrial fibrillation: Secondary | ICD-10-CM

## 2020-06-21 DIAGNOSIS — I739 Peripheral vascular disease, unspecified: Secondary | ICD-10-CM | POA: Diagnosis not present

## 2020-06-21 DIAGNOSIS — F17200 Nicotine dependence, unspecified, uncomplicated: Secondary | ICD-10-CM | POA: Diagnosis not present

## 2020-06-21 DIAGNOSIS — M779 Enthesopathy, unspecified: Secondary | ICD-10-CM

## 2020-06-21 DIAGNOSIS — I4819 Other persistent atrial fibrillation: Secondary | ICD-10-CM | POA: Diagnosis not present

## 2020-06-21 DIAGNOSIS — E782 Mixed hyperlipidemia: Secondary | ICD-10-CM

## 2020-06-21 DIAGNOSIS — L84 Corns and callosities: Secondary | ICD-10-CM

## 2020-06-21 DIAGNOSIS — M21621 Bunionette of right foot: Secondary | ICD-10-CM

## 2020-06-21 MED ORDER — DOFETILIDE 500 MCG PO CAPS: 500.0000 ug | ORAL_CAPSULE | Freq: Two times a day (BID) | ORAL | 1 refills | Status: DC

## 2020-06-21 NOTE — Progress Notes (Signed)
Patient was seen today for offloading painful plantar fibromas/keratomas.  Area of concerned was marked and patient was scanned/cast to offload the keratoma/fibroma.  A LW accomodative device will be fabricated for the patient with appropriate offloads.  

## 2020-06-21 NOTE — Patient Instructions (Addendum)
Medication Instructions:  *If you need a refill on your cardiac medications before your next appointment, please call your pharmacy*  Follow-Up: At [facility], you and your health needs are our priority.  As part of our continuing mission to provide you with exceptional heart care, we have created designated Provider Care Teams.  These Care Teams include your primary Cardiologist (physician) and Advanced Practice Providers (APPs -  Physician Assistants and Nurse Practitioners) who all work together to provide you with the care you need, when you need it.  We recommend signing up for the patient portal called "MyChart".  Sign up information is provided on this After Visit Summary.  MyChart is used to connect with patients for Virtual Visits (Telemedicine).  Patients are able to view lab/test results, encounter notes, upcoming appointments, etc.  Non-urgent messages can be sent to your provider as well.   To learn more about what you can do with MyChart, go to [email or web address].    Your next appointment:   Your physician recommends that you schedule a follow-up appointment in: 6 MONTHS with Dr. Curt Bears. -- Friday 12/07/20 at 10:30 am  The format for your next appointment:   In Person with Allegra Lai, MD      4

## 2020-06-21 NOTE — Progress Notes (Signed)
PCP:  [name], MD Primary Cardiologist: No primary care provider on file. Electrophysiologist: [name], MD   Bradley Ferguson is a 69 y.o. male seen today for [name], MD for routine electrophysiology followup.  Since last being seen in our clinic the patient reports doing OK. He continues to smoke > 0.5 ppd. Now being followed by Dr. [name] for symptomatic PVD. He has mild SOB with moderate exertion, but is more limited from his legs and feet. he denies chest pain, palpitations, PND, orthopnea, nausea, vomiting, dizziness, syncope, edema, weight gain, or early satiety.  Past Medical History:  Diagnosis Date  . Anxiety   . Atrial fibrillation ([place])   . Blood transfusion without reported diagnosis 1976  . Cataract   . Depression   . Dyspnea    climbing stairs or walking "in big store- I get short of breathe."  . Dysrhythmia   . Emphysema of lung ([place])   . Heart murmur   . Hyperlipidemia   . ILD (interstitial lung disease) ([place])   . PVD (peripheral vascular disease) ([place])   . Raynaud's syndrome   . Systemic sclerosis ([place])   . TB lung, latent 12/19/2019  . Transaminitis 06/18/2020   Past Surgical History:  Procedure Laterality Date  . ATRIAL FIBRILLATION ABLATION    . CATARACT EXTRACTION Right   . COLONOSCOPY    . EYE SURGERY    . FRACTURE SURGERY Left 1976  . INGUINAL HERNIA REPAIR Right 03/23/2020   Procedure: OPEN RIGHT INGUINAL HERNIA REPAIR WITH MESH;  Surgeon: Johnathan Hausen, MD;  Location: [facility];  Service: General;  Laterality: Right;  . INSERTION OF MESH Right 03/23/2020   Procedure: INSERTION OF MESH;  Surgeon: Johnathan Hausen, MD;  Location: [facility];  Service: General;  Laterality: Right;  . LOWER EXTREMITY INTERVENTION N/A 04/27/2017   Procedure: LOWER EXTREMITY INTERVENTION;  Surgeon: Lorretta Harp, MD;  Location: [facility];  Service: Cardiovascular;  Laterality: N/A;  . PERIPHERAL VASCULAR ATHERECTOMY Right 04/27/2017    Procedure: PERIPHERAL VASCULAR ATHERECTOMY;  Surgeon: Lorretta Harp, MD;  Location: [facility];  Service: Cardiovascular;  Laterality: Right;  Iliac  . PERIPHERAL VASCULAR INTERVENTION Right 04/27/2017   Procedure: PERIPHERAL VASCULAR INTERVENTION;  Surgeon: Lorretta Harp, MD;  Location: [facility];  Service: Cardiovascular;  Laterality: Right;  Iliac    Current Outpatient Medications  Medication Sig Dispense Refill  . ALPRAZolam (XANAX) 1 MG tablet Take 1/2-1 tablet 2 - 3 x /day ONLY if needed for Anxiety Attack &  limit to 5 days /week to avoid addiction (Patient taking differently: Take 1/2-1 tablet 2 - 3 x /day ONLY if needed for Anxiety Attack &  limit to 5 days /week to avoid addiction) 90 tablet 0  . atorvastatin (LIPITOR) 80 MG tablet Take 1 tablet Daily for Cholesterol 90 tablet 1  . Cholecalciferol 250 MCG (10000 UT) CAPS Take 10,000 Units by mouth daily.     Marland Kitchen dofetilide (TIKOSYN) 500 MCG capsule Take 1 capsule (500 mcg total) by mouth 2 (two) times daily. Please make overdue appt with Dr. Curt Bears before anymore refills. Thank you 2nd attempt 30 capsule 0  . isoniazid (NYDRAZID) 300 MG tablet Take 1 tablet (300 mg total) by mouth daily. 90 tablet 0  . ketoconazole (NIZORAL) 2 % cream Apply to rash 1 to 2 x /day 60 g 1  . pyridOXINE (VITAMIN B-6) 100 MG tablet Take 100 mg by mouth daily.    Marland Kitchen  rivaroxaban (XARELTO) 20 MG TABS tablet Take 1 tablet Daily to Prevent Blood Clots 90 tablet 1  . triamcinolone cream (KENALOG) 0.5 % Apply 1 application topically 2 (two) times daily. (Patient taking differently: Apply 1 application topically daily as needed (rash).) 80 g 1   No current facility-administered medications for this visit.    Allergies  Allergen Reactions  . Cymbalta [Duloxetine Hcl]   . Effexor [Venlafaxine]     Feels bad  . Wellbutrin [Bupropion]     sleepy  . Zoloft [Sertraline Hcl]     Felt bad    Social History   Socioeconomic History  .  Marital status: Divorced    Spouse name: Not on file  . Number of children: 1  . Years of education: Not on file  . Highest education level: Not on file  Occupational History  . 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 Smoking status: Current Every Day Smoker    Packs/day: 0.75    Years: 50.00    Pack years: 37.50    Types: Cigarettes    Start date: 79  . Smokeless tobacco: Never Used  Vaping Use  . Vaping Use: Never used  Substance and Sexual Activity  . Alcohol use: Yes    Alcohol/week: 2.0 standard drinks    Types: 2 Standard drinks or equivalent per week  . Drug use: Never  . Sexual activity: Not on file  Other Topics Concern  . Not on file  Social History Narrative  . Not on file   Social Determinants of Health   Financial Resource Strain: Not on file  Food Insecurity: Not on file  Transportation Needs: Not on file  Physical Activity: Not on file  Stress: Not on file  Social Connections: Not on file  Intimate Partner Violence: Not on file     Review of Systems: General: No chills, fever, night sweats or weight changes  Cardiovascular:  No chest pain, dyspnea on exertion, edema, orthopnea, palpitations, paroxysmal nocturnal dyspnea Dermatological: No rash, lesions or masses Respiratory: No cough, dyspnea Urologic: No hematuria, dysuria Abdominal: No nausea, vomiting, diarrhea, bright red blood per rectum, melena, or hematemesis Neurologic: No visual changes, weakness, changes in mental status All other systems reviewed and are otherwise negative except as noted above.  Physical Exam: Vitals:   06/21/20 1216  BP: 134/64  SpO2: 98%  Weight: 184 lb (83.5 kg)  Height: 6' 1.5" (1.867 m)    GEN- The patient is well appearing, alert and oriented x 3 today.   HEENT: normocephalic, atraumatic; sclera clear, conjunctiva pink; hearing intact; oropharynx clear; neck supple, no JVP Lymph- no cervical lymphadenopathy Lungs- Clear to ausculation  bilaterally, normal work of breathing.  No wheezes, rales, rhonchi Heart- Regular rate and rhythm, no murmurs, rubs or gallops, PMI not laterally displaced GI- soft, non-tender, non-distended, bowel sounds present, no hepatosplenomegaly Extremities- no clubbing, cyanosis, or edema; DP/PT/radial pulses 2+ bilaterally MS- no significant deformity or atrophy Skin- warm and dry, no rash or lesion Psych- euthymic mood, full affect Neuro- strength and sensation are intact  EKG is ordered. Personal review of EKG from today shows NSR at 98 bpm, QTc ~ 480 when measured manually and corrected for rate and QRS.  Additional studies reviewed include: Recent office notes, previous EP and AF clinic notes.   Assessment and Plan:  1. Persistent Atrial fibrillation EKG today shows QTc has improved off pletal.  Continue tikosyn 500 mcg BID Continue Xarelto for CHA2DS2VASC of at least 2.  Avoid QT prolonging agents.   2. PAD Smoking cessation advised.  Failed pletal due to QT prolongation  3. HLD Continue statin  4. HTN Continue current regimen  5. Tobacco abuse Smoking cessation advised. Still smoking > 1/2 ppd.   Needs EKG, BMET, and Mg at least every 6 months.   Shirley Friar, PA-C  06/21/20 12:23 PM

## 2020-07-05 ENCOUNTER — Telehealth: Payer: Self-pay

## 2020-07-05 ENCOUNTER — Encounter: Payer: Self-pay | Admitting: Nurse Practitioner

## 2020-07-05 ENCOUNTER — Encounter: Payer: Self-pay | Admitting: Adult Health

## 2020-07-05 ENCOUNTER — Ambulatory Visit
Admission: RE | Admit: 2020-07-05 | Discharge: 2020-07-05 | Disposition: A | Discharge status: Home / Self Care | Payer: PPO | Source: Ambulatory Visit | Attending: Adult Health | Admitting: Adult Health

## 2020-07-05 ENCOUNTER — Ambulatory Visit: Payer: PPO | Admitting: Nurse Practitioner

## 2020-07-05 VITALS — BP 116/58 | HR 76 | Ht 73.5 in | Wt 180.5 lb

## 2020-07-05 DIAGNOSIS — R7989 Other specified abnormal findings of blood chemistry: Secondary | ICD-10-CM

## 2020-07-05 DIAGNOSIS — K7689 Other specified diseases of liver: Secondary | ICD-10-CM | POA: Diagnosis not present

## 2020-07-05 DIAGNOSIS — K760 Fatty (change of) liver, not elsewhere classified: Secondary | ICD-10-CM | POA: Insufficient documentation

## 2020-07-05 DIAGNOSIS — J449 Chronic obstructive pulmonary disease, unspecified: Secondary | ICD-10-CM

## 2020-07-05 DIAGNOSIS — I480 Paroxysmal atrial fibrillation: Secondary | ICD-10-CM

## 2020-07-05 DIAGNOSIS — D126 Benign neoplasm of colon, unspecified: Secondary | ICD-10-CM

## 2020-07-05 MED ORDER — SUTAB 1479-225-188 MG PO TABS: 1.0000 | ORAL_TABLET | ORAL | 0 refills | Status: DC

## 2020-07-05 NOTE — Patient Instructions (Signed)
If you are age 69 or older, your body mass index should be between 23-30. Your Body mass index is 23.49 kg/m. If this is out of the aforementioned range listed, please consider follow up with your Primary Care Provider.  If you are age 48 or younger, your body mass index should be between 19-25. Your Body mass index is 23.49 kg/m. If this is out of the aformentioned range listed, please consider follow up with your Primary Care Provider.    You have been scheduled for a colonoscopy. Please follow written instructions given to you at your visit today.  Please pick up your prep supplies at the pharmacy within the next 1-3 days. If you use inhalers (even only as needed), please bring them with you on the day of your procedure.  You will be contacted by our office prior to your procedure for directions on holding your Xarelto.  If you do not hear from our office 1 week prior to your scheduled procedure, please call [phone] to discuss.    Please follow up with your primary care provider regarding COPD prior to your colonoscopy.  Smoking cessation is recommended.  It was great seeing you today!  Thank you for entrusting me with your care and choosing [facility].  Alcide Evener, NP

## 2020-07-05 NOTE — Progress Notes (Signed)
07/05/2020 Bradley Ferguson [id number] 1950/07/14   Chief Complaint:  Schedule a colonoscopy   History of Present Illness: Bradley Ferguson is a 70 year old male with a past medical history of anxiety, depression, bipolar disorder, attention deficit disorder, atrial fibrillation on Xarelto, peripheral vascular disease status post right common iliac atherectomy and right common iliac stent with covered stent in October 2018, Raynaud's syndrome, systemic sclerosis, Latent TB on Isoniazid, COPD and colon polyps.  He presents to our office today to schedule a follow-up colonoscopy.  He underwent a colonoscopy by Dr. Henrene Pastor on 07/23/2018 and twenty 2 to 18mm tubular adenomatous polyps were removed from the rectum, rectosigmoid colon, descending, transverse colon, ascending colon and in the cecum.  Repeat colonoscopy in 6 months was recommended but was not done.  No family history of colon cancer.  Genetic testing has not been done.  He denies having any upper or lower abdominal pain.  He acknowledges when he eats a healthier higher fiber diet he passes normal formed brown bowel movements.  He has looser stools when he eats prepared foods or more carbohydrates.  He occasionally sees a spot of bright red blood on the toilet tissue which occurs once or twice monthly.  He continues to smoke cigarettes, he is down to 1/2 pack of cigarettes daily and reports smoking for 40 years.  He has COPD with a smoker's cough.  He does not require home oxygen.   CBC Latest Ref Rng & Units 06/12/2020 04/30/2020 03/23/2020  WBC 3.8 - 10.8 Thousand/uL 7.3 6.1 7.5  Hemoglobin 13.2 - 17.1 g/dL 14.4 14.4 12.6(L)  Hematocrit 38.5 - 50.0 % 43.2 44.0 39.9  Platelets 140 - 400 Thousand/uL 162 167 142(L)   CMP Latest Ref Rng & Units 06/12/2020 04/30/2020 03/23/2020  Glucose 65 - 99 mg/dL 93 152(H) 95  BUN 7 - 25 mg/dL 14 15 13   Creatinine 0.70 - 1.25 mg/dL 0.98 1.01 0.92  Sodium 135 - 146 mmol/L 141 137 137  Potassium 3.5 - 5.3  mmol/L 4.7 4.8 4.1  Chloride 98 - 110 mmol/L 103 102 106  CO2 20 - 32 mmol/L 29 27 23   Calcium 8.6 - 10.3 mg/dL 9.4 9.5 8.5(L)  Total Protein 6.1 - 8.1 g/dL 6.9 6.9 -  Total Bilirubin 0.2 - 1.2 mg/dL 0.6 0.9 -  Alkaline Phos 39 - 117 IU/L - - -  AST 10 - 35 U/L 45(H) 50(H) -  ALT 9 - 46 U/L 51(H) 68(H) -   Abdominal ultrasound 07/05/2020: Negative for gallstones or biliary dilatation. Increased echogenicity liver suggesting chronic liver disease/hepatic steatosis.  Colonoscopy 07/23/2018: - TWENTY 2 to 25 mm polyps in the rectum, at the recto-sigmoid colon, in the descending colon, in the transverse colon, in the ascending colon and in the cecum, removed with cold and hot snare techniques as described. Resected and retrieved. Tattooed in 1 area between large polyps in the transverse colon. - Diverticulosis in the left colon.  Pulmonary function test 02/29/2020: Post-Test Comments: Good patient effort. The results of this test meets ATS standards for acceptability and repeatability. Hgb default value of 14.6 used. The FEV1 and FEV1/FVC ratio are reduced. The reduced diffusing capacity indicates a mild loss of functional alveolar capillary surface.  ECHO 03/08/2020: 1. Left ventricular ejection fraction, by estimation, is 65 to 70%. The left ventricle has hyperdynamic function. The left ventricle has no regional wall motion abnormalities. There is mild left ventricular hypertrophy. Left ventricular diastolic parameters are consistent with Grade II  diastolic dysfunction (pseudonormalization). 2. Right ventricular systolic function is normal. The right ventricular size is normal. There is normal pulmonary artery systolic pressure. The estimated right ventricular systolic pressure is 26.4 mmHg. 3. Left atrial size was mildly dilated. 4. Chiari network noted. Right atrial size was mildly dilated. 5. The mitral valve is normal in structure. No evidence of mitral valve regurgitation.  No evidence of mitral stenosis. Moderate mitral annular calcification. 6. The aortic valve is tricuspid. Aortic valve regurgitation is not visualized. Mild aortic valve sclerosis is present, with no evidence of aortic valve stenosis. 7. The inferior vena cava is normal in size with greater than 50% respiratory variability, suggesting right atrial pressure of 3 mmHg.  Current Outpatient Medications on File Prior to Visit  Medication Sig Dispense Refill  . ALPRAZolam (XANAX) 1 MG tablet Take 1/2-1 tablet 2 - 3 x /day ONLY if needed for Anxiety Attack &  limit to 5 days /week to avoid addiction (Patient taking differently: Take 1/2-1 tablet 2 - 3 x /day ONLY if needed for Anxiety Attack &  limit to 5 days /week to avoid addiction) 90 tablet 0  . atorvastatin (LIPITOR) 80 MG tablet Take 1 tablet Daily for Cholesterol 90 tablet 1  . Cholecalciferol 250 MCG (10000 UT) CAPS Take 10,000 Units by mouth daily.     Marland Kitchen dofetilide (TIKOSYN) 500 MCG capsule Take 1 capsule (500 mcg total) by mouth 2 (two) times daily. 180 capsule 1  . isoniazid (NYDRAZID) 300 MG tablet Take 1 tablet (300 mg total) by mouth daily. 90 tablet 0  . ketoconazole (NIZORAL) 2 % cream Apply to rash 1 to 2 x /day 60 g 1  . pyridOXINE (VITAMIN B-6) 100 MG tablet Take 100 mg by mouth daily.    . rivaroxaban (XARELTO) 20 MG TABS tablet Take 1 tablet Daily to Prevent Blood Clots 90 tablet 1  . triamcinolone cream (KENALOG) 0.5 % Apply 1 application topically 2 (two) times daily. (Patient taking differently: Apply 1 application topically daily as needed (rash).) 80 g 1   No current facility-administered medications on file prior to visit.   Allergies  Allergen Reactions  . Cymbalta [Duloxetine Hcl]   . Effexor [Venlafaxine]     Feels bad  . Wellbutrin [Bupropion]     sleepy  . Zoloft [Sertraline Hcl]     Felt bad    Current Medications, Allergies, Past Medical History, Past Surgical History, Family History and Social History were  reviewed in [employer] record.   Review of Systems:   Constitutional: Negative for fever, sweats, chills or weight loss.  Respiratory: Negative for shortness of breath.   Cardiovascular: Negative for chest pain, palpitations and leg swelling.  Gastrointestinal: See HPI.  Musculoskeletal: Negative for back pain or muscle aches.  Neurological: Negative for dizziness, headaches or paresthesias.    Physical Exam: BP (!) 116/58 (BP Location: Left Arm, Patient Position: Sitting, Cuff Size: Normal)   Pulse 76   Ht 6' 1.5" (1.867 m)   Wt 180 lb 8 oz (81.9 kg)   BMI 23.49 kg/m  General: 70 year old male in no acute distress. Head: Normocephalic and atraumatic. Eyes: No scleral icterus. Conjunctiva pink . Ears: Normal auditory acuity. Mouth: Upper and lower dentures. No ulcers or lesions.  Lungs: Inspiratory and expiratory wheezes bilaterally. Heart: Regular rate and rhythm, no murmur. Abdomen: Soft, nontender and nondistended. No masses or hepatomegaly. Normal bowel sounds x 4 quadrants.  Rectal: Deferred. Musculoskeletal: Symmetrical with no gross deformities.  Extremities: No edema. Venous stasis discoloration to his lower extremities. Neurological: Alert oriented x 4. No focal deficits.  Psychological: Alert and cooperative. Normal mood and affect  Assessment and Recommendations:  69.  70 year old male with a history of 20 tubular adenomatous polyps removed throughout the colon and rectum at the time of his colonoscopy 07/19/2018. -Colonoscopy benefits and risks discussed including risk with sedation, risk of bleeding, perforation and infection  -Our office will contact his cardiologist to verify Xarelto instructions prior to proceeding with a colonoscopy -The patient was advised his COPD/emphysema status must be stable prior to proceeding with sedation for a colonoscopy.  He had bilateral inspiratory and expiratory wheezes on exam.  I advised the patient to  follow-up with his PCP and pulmonologist for further management -Follow-up to be determined after colonoscopy completed  2.  Atrial fibrillation on Xarelto  3.  Peripheral arterial disease s/ p right common iliac atherectomy and right common iliac stent with covered stent in October 2018 on Xarelto  4.  COPD/interstitial lung disease scattered inspiratory and expiratory wheezes on exam.  Prior COPD medications were not affordable. -See plan in #1 -Smoking cessation encouraged  5.  Elevated LFTs possible due to Isoniazid in setting of hepatic steatosis. Abdominal  ultrasound earlier today showed increased echogenicity of the liver suggesting chronic liver disease/hepatic steatosis. -ID and rheumatology following LFTs -Recommend further follow-up in our office if his LFTs remain elevated  6.  History of prior positive TB skin test with positive Quantiferon gold level. Currently on Isoniazid per ID, to complete 9 month tx 08/2020  7.  Systemic sclerosis followed by rheumatology

## 2020-07-05 NOTE — Telephone Encounter (Signed)
Patient with diagnosis of atrial fibrillation on Xarelto for anticoagulation.    Procedure: colonoscopy Date of procedure: 08/21/20    CHA2DS2-VASc Score = 3  This indicates a 3.2% annual risk of stroke. The patient's score is based upon: CHF History: No HTN History: Yes Diabetes History: No Stroke History: No Vascular Disease History: Yes Age Score: 1 Gender Score: 0   CrCl 83.6 Platelet count 162  Per office protocol, patient can hold Xarelto for 2 days prior to procedure.    Patient will not need bridging with Lovenox (enoxaparin) around procedure.

## 2020-07-05 NOTE — Progress Notes (Signed)
Assessment and plan noted ?

## 2020-07-05 NOTE — Telephone Encounter (Signed)
Request for surgical clearance:     Endoscopy Procedure  What type of surgery is being performed?     Colonoscopy  When is this surgery scheduled?     08/01/20  What type of clearance is required ?   Pharmacy  Are there any medications that need to be held prior to surgery and how long? Xarelto 3-5 day hold  Practice name and name of physician performing surgery?      [facility]  What is your office phone and fax number?      Phone- [phone]  [phone]  Anesthesia type (None, local, MAC, general) ?       [facility]

## 2020-07-05 NOTE — Telephone Encounter (Signed)
   Primary Cardiologist: No primary care provider on file.  Chart reviewed as part of pre-operative protocol coverage. We have been contacted to provide pharmacy clearance prior to procedure. Per our pharmacy team:   Patient with diagnosis of atrial fibrillation on Xarelto for anticoagulation.    Procedure: colonoscopy Date of procedure: 08/21/20  CHA2DS2-VASc Score = 3  This indicates a 3.2% annual risk of stroke. The patient's score is based upon: CHF History: No HTN History: Yes Diabetes History: No Stroke History: No Vascular Disease History: Yes Age Score: 1 Gender Score: 0   CrCl 83.6 Platelet count 162  Per office protocol, patient can hold Xarelto for 2 days prior to procedure.    Patient will not need bridging with Lovenox (enoxaparin) around procedure.  I will route this recommendation to the requesting party via Epic fax function and remove from pre-op pool.  Please call with questions.  Georgie Chard, NP 07/05/2020, 2:36 PM

## 2020-07-19 ENCOUNTER — Encounter: Payer: PPO | Admitting: Orthotics

## 2020-07-20 NOTE — Progress Notes (Signed)
Assessment and Plan:  Tyson was seen today for acute visit.  Diagnoses and all orders for this visit:  Chronic obstructive pulmonary disease, unspecified COPD type ([place]) COPD appears well controlled at this time with breztri samples that were provided; Appropriate use (1 puff BID, rinse mouth after) reviewed and given additional 1 month supply  He is due for recommended pulm follow up; encouraged to schedule However currently well controlled COPD without breakthrough; will clear for routine colonoscopy with Dr. Henrene Pastor  -     Budeson-Glycopyrrol-Formoterol (BREZTRI AEROSPHERE) 160-9-4.8 MCG/ACT AERO; Inhale 1 puff into the lungs in the morning and at bedtime.  Interstitial pulmonary disease (HCC) Pulm Dr. Vaughan Browner following; Reum Dr. Estanislado Pandy pending imuran He is due to follow up with pulm for CT for monitoring and follow up this month per their last note Encouarged him to call and schedule as recommended  TB lung, latent ID following; continue isoniazid  Systemic scleroses ([place]) Dr. Estanislado Pandy following; pending possible imuran He has extensive questions and concerns; discussed at length today, but encouraged him to follow up with Dr. Estanislado Pandy for more complete discussion of her recommendations, risks/benefits  Hepatic steatosis Elevated LFTs Last LFTs trending down; US showed likely hepatic steatosis Discussed reducing processed carbohydrate, excessive fatty foods; increase whole plants; encouraged regular exercise and maintenance of healthy weight Avoid alcohol and excess tylenol Last LFTs were trending down; per his request will forward to Dr. Estanislado Pandy for review, was deferring imuran due to elevations  Further disposition pending results of labs. Discussed med's effects and SE's.   Over 30 minutes of exam, counseling, chart review, and critical decision making was performed.   Future Appointments  Date Time Provider [place]  07/25/2020  4:00 PM Velora Heckler [facility]  08/01/2020  3:00 PM Irene Shipper, MD [facility]  09/04/2020 11:15 AM Lorretta Harp, MD [facility]  11/12/2020 10:30 AM [name], MD [facility] None  12/07/2020 10:30 AM Constance Haw, MD [facility]  05/08/2021  2:00 PM [name], MD [facility] None  06/12/2021 11:15 AM Liane Comber, NP [facility] None    ------------------------------------------------------------------------------------------------------------------   HPI 70 y.o.male presents requesting that we listen to his lungs prior to upcoming colonoscopy on 08/01/2020 by Dr. Henrene Pastor.   Patient was recently dx'd by Rheumatology (Dr Estanislado Pandy) with limitedsystemic sclerosis with intentions to start Imuran when he has completed 2 months of INH for a positive QuantiFERON test, however holding on due to elevated LFTs, continues with isoniazid.  High-resolution Chest CT in Jan found basilar fibrotic interstitial lung disease with probable UIP & patient has also been evaluated by Pulmonary by Dr Vaughan Browner. Trellegy inhaler was prescribed but without perceived benefit and cost prohibitive; last OV he was given breztri samples and today reports significantly improved resp sx, denies dyspnea, wheezing, secretions. Per Dr. Matilde Bash last note, plan for repeat CT this month with follow up.    he currently continues to smoke, 50 pack year history; discussed risks associated with smoking, patient is slowly tapering down, was down to 0.5 pack but admits smoking more recently due to roommate stress. . He had CT lung 01/19/2020.   Cardiology cleared 07/05/2020 with instructions to hold xarelto 2 days prior to procedure.   Past Medical History:  Diagnosis Date  . Anxiety   . Atrial fibrillation ([place])   . Blood transfusion without reported diagnosis 1976  . Cataract   . Depression   . Dyspnea    climbing stairs or walking "in big store-  I get short of breathe."  . Dysrhythmia   . Emphysema  of lung ([place])   . Heart murmur   . Hyperlipidemia   . ILD (interstitial lung disease) ([place])   . PVD (peripheral vascular disease) ([place])   . Raynaud's syndrome   . Systemic sclerosis ([place])   . TB lung, latent 12/19/2019  . Transaminitis 06/18/2020     Allergies  Allergen Reactions  . Cymbalta [Duloxetine Hcl]   . Effexor [Venlafaxine]     Feels bad  . Wellbutrin [Bupropion]     sleepy  . Zoloft [Sertraline Hcl]     Felt bad    Current Outpatient Medications on File Prior to Visit  Medication Sig  . ALPRAZolam (XANAX) 1 MG tablet Take 1/2-1 tablet 2 - 3 x /day ONLY if needed for Anxiety Attack &  limit to 5 days /week to avoid addiction (Patient taking differently: Take 1/2-1 tablet 2 - 3 x /day ONLY if needed for Anxiety Attack &  limit to 5 days /week to avoid addiction)  . atorvastatin (LIPITOR) 80 MG tablet Take 1 tablet Daily for Cholesterol  . Cholecalciferol 250 MCG (10000 UT) CAPS Take 10,000 Units by mouth daily.   Marland Kitchen dofetilide (TIKOSYN) 500 MCG capsule Take 1 capsule (500 mcg total) by mouth 2 (two) times daily.  Marland Kitchen isoniazid (NYDRAZID) 300 MG tablet Take 1 tablet (300 mg total) by mouth daily.  Marland Kitchen ketoconazole (NIZORAL) 2 % cream Apply to rash 1 to 2 x /day  . pyridOXINE (VITAMIN B-6) 100 MG tablet Take 100 mg by mouth daily.  . rivaroxaban (XARELTO) 20 MG TABS tablet Take 1 tablet Daily to Prevent Blood Clots  . triamcinolone cream (KENALOG) 0.5 % Apply 1 application topically 2 (two) times daily. (Patient taking differently: Apply 1 application topically daily as needed (rash).)  . Sodium Sulfate-Mag Sulfate-KCl (SUTAB) [phone] MG TABS Take 1 kit by mouth as directed. MANUFACTURER CODES!! BIN: [id number] PCN: CN GROUP: [id number] MEMBER ID: [id number];KGY AS CASH;NO PRIOR AUTHORIZATION   No current facility-administered medications on file prior to visit.    ROS: all negative except above.   Physical Exam:  BP 120/68   Pulse 87   Temp (!) 97.5 F (36.4 C)   Ht  6' 1.5" (1.867 m)   Wt 181 lb (82.1 kg)   SpO2 96%   BMI 23.56 kg/m   General Appearance: Well nourished, in no apparent distress. Eyes: PERRLA, EOMs, conjunctiva no swelling or erythema Sinuses: No Frontal/maxillary tenderness ENT/Mouth: Ext aud canals clear, TMs without erythema, bulging. No erythema, swelling, or exudate on post pharynx.  Tonsils not swollen or erythematous. Hearing normal.  Neck: Supple, thyroid normal.  Respiratory: Respiratory effort normal, BS equal bilaterally without rales/rhonchi; he does have mild coarse expiratory sounds RUL, LLL without wheezing or stridor.  Cardio: RRR with no MRGs. Brisk peripheral pulses without edema.  Abdomen: Soft, + BS.  Non tender, no guarding, rebound, hernias, masses. Lymphatics: Non tender without lymphadenopathy.  Musculoskeletal: Full ROM, 5/5 strength, normal gait.  Skin: Warm, dry without rashes, lesions, ecchymosis.  Neuro: Cranial nerves intact. Normal muscle tone, no cerebellar symptoms. Sensation intact.  Psych: Awake and oriented X 3, normal affect, Insight and Judgment appropriate.     Izora Ribas, NP 1:30 PM [facility]

## 2020-07-23 ENCOUNTER — Encounter: Payer: Self-pay | Admitting: Adult Health

## 2020-07-23 ENCOUNTER — Other Ambulatory Visit: Payer: Self-pay

## 2020-07-23 ENCOUNTER — Ambulatory Visit (INDEPENDENT_AMBULATORY_CARE_PROVIDER_SITE_OTHER): Payer: PPO | Admitting: Adult Health

## 2020-07-23 VITALS — BP 120/68 | HR 87 | Temp 97.5°F | Ht 73.5 in | Wt 181.0 lb

## 2020-07-23 DIAGNOSIS — M349 Systemic sclerosis, unspecified: Secondary | ICD-10-CM

## 2020-07-23 DIAGNOSIS — R7989 Other specified abnormal findings of blood chemistry: Secondary | ICD-10-CM | POA: Diagnosis not present

## 2020-07-23 DIAGNOSIS — Z227 Latent tuberculosis: Secondary | ICD-10-CM | POA: Diagnosis not present

## 2020-07-23 DIAGNOSIS — J849 Interstitial pulmonary disease, unspecified: Secondary | ICD-10-CM

## 2020-07-23 DIAGNOSIS — J449 Chronic obstructive pulmonary disease, unspecified: Secondary | ICD-10-CM | POA: Diagnosis not present

## 2020-07-23 DIAGNOSIS — K760 Fatty (change of) liver, not elsewhere classified: Secondary | ICD-10-CM

## 2020-07-23 MED ORDER — BREZTRI AEROSPHERE 160-9-4.8 MCG/ACT IN AERO: 1.0000 | INHALATION_SPRAY | Freq: Two times a day (BID) | RESPIRATORY_TRACT | 2 refills | Status: DC

## 2020-07-23 NOTE — Patient Instructions (Signed)
    Could try voltaren gel or aspercreme, can do 3-4 times a day   Follow up with podiatry for thick

## 2020-07-24 ENCOUNTER — Other Ambulatory Visit: Payer: Self-pay

## 2020-07-24 DIAGNOSIS — J449 Chronic obstructive pulmonary disease, unspecified: Secondary | ICD-10-CM

## 2020-07-24 MED ORDER — BREZTRI AEROSPHERE 160-9-4.8 MCG/ACT IN AERO: 1.0000 | INHALATION_SPRAY | Freq: Two times a day (BID) | RESPIRATORY_TRACT | 2 refills | Status: DC

## 2020-07-25 ENCOUNTER — Encounter: Payer: PPO | Admitting: Orthotics

## 2020-07-26 ENCOUNTER — Other Ambulatory Visit: Payer: Self-pay

## 2020-07-26 ENCOUNTER — Encounter: Payer: PPO | Admitting: Orthotics

## 2020-08-01 ENCOUNTER — Ambulatory Visit (AMBULATORY_SURGERY_CENTER): Payer: PPO | Admitting: Internal Medicine

## 2020-08-01 ENCOUNTER — Encounter: Payer: Self-pay | Admitting: Internal Medicine

## 2020-08-01 ENCOUNTER — Other Ambulatory Visit: Payer: Self-pay

## 2020-08-01 ENCOUNTER — Encounter: Payer: PPO | Admitting: Internal Medicine

## 2020-08-01 VITALS — BP 120/68 | HR 76 | Temp 98.2°F | Resp 20 | Ht 73.0 in | Wt 180.0 lb

## 2020-08-01 DIAGNOSIS — Z86010 Personal history of colonic polyps: Secondary | ICD-10-CM

## 2020-08-01 DIAGNOSIS — K6389 Other specified diseases of intestine: Secondary | ICD-10-CM | POA: Diagnosis not present

## 2020-08-01 DIAGNOSIS — K5289 Other specified noninfective gastroenteritis and colitis: Secondary | ICD-10-CM

## 2020-08-01 DIAGNOSIS — D126 Benign neoplasm of colon, unspecified: Secondary | ICD-10-CM

## 2020-08-01 DIAGNOSIS — D122 Benign neoplasm of ascending colon: Secondary | ICD-10-CM

## 2020-08-01 DIAGNOSIS — K635 Polyp of colon: Secondary | ICD-10-CM | POA: Diagnosis not present

## 2020-08-01 MED ORDER — SODIUM CHLORIDE 0.9 % IV SOLN: 500.0000 mL | Freq: Once | INTRAVENOUS | Status: DC

## 2020-08-01 NOTE — Progress Notes (Signed)
Called to room to assist during endoscopic procedure.  Patient ID and intended procedure confirmed with present staff. Received instructions for my participation in the procedure from the performing physician.  

## 2020-08-01 NOTE — Progress Notes (Signed)
Pt Drowsy. VSS. To PACU, report to RN. No anesthetic complications noted.  

## 2020-08-01 NOTE — Op Note (Signed)
[name] Patient Name: Bradley Ferguson Procedure Date: 08/01/2020 1:49 PM MRN: [id number] Endoscopist: Docia Chuck. Henrene Pastor , MD Age: 70 Referring MD:  Date of Birth: Nov 13, 1950 Gender: Male Account #: [id number] Procedure:                Colonoscopy with cold snare polypectomy x 2; with                            biopsies Indications:              High risk colon cancer surveillance: Personal                            history of adenoma (10 mm or greater in size), High                            risk colon cancer surveillance: Personal history of                            multiple (3 or more) adenomas. Previous examination                            January 2020 (20 polyps that were adenomatous                            including large adenomas). Overdue for follow-up Medicines:                Monitored Anesthesia Care Procedure:                Pre-Anesthesia Assessment:                           - Prior to the procedure, a History and Physical                            was performed, and patient medications and                            allergies were reviewed. The patient's tolerance of                            previous anesthesia was also reviewed. The risks                            and benefits of the procedure and the sedation                            options and risks were discussed with the patient.                            All questions were answered, and informed consent                            was obtained. Prior Anticoagulants: The patient has  taken Xarelto (rivaroxaban), last dose was 3 days                            prior to procedure. ASA Grade Assessment: III - A                            patient with severe systemic disease. After                            reviewing the risks and benefits, the patient was                            deemed in satisfactory condition to undergo the                            procedure.                            After obtaining informed consent, the colonoscope                            was passed under direct vision. Throughout the                            procedure, the patient's blood pressure, pulse, and                            oxygen saturations were monitored continuously. The                            Olympus CF-HQ190L ([id number]) Colonoscope was                            introduced through the anus and advanced to the the                            cecum, identified by appendiceal orifice and                            ileocecal valve. The ileocecal valve, appendiceal                            orifice, and rectum were photographed. The quality                            of the bowel preparation was excellent. The                            colonoscopy was performed without difficulty. The                            patient tolerated the procedure well. The bowel  preparation used was SUPREP via split dose                            instruction. Scope In: 1:54:00 PM Scope Out: 2:10:53 PM Scope Withdrawal Time: 0 hours 13 minutes 32 seconds  Total Procedure Duration: 0 hours 16 minutes 53 seconds  Findings:                 Two polyps were found in the ascending colon. The                            polyps were 2 to 3 mm in size. These polyps were                            removed with a cold snare. Resection and retrieval                            were complete.                           Multiple diverticula were found in the entire colon.                           The previously placed marking tattoo was located in                            the distal transverse colon. There was no obvious                            residual adenoma. Biopsies were taken from the                            polypectomy scar site. The exam was otherwise                            without abnormality on direct and retroflexion                             views. Complications:            No immediate complications. Estimated blood loss:                            None. Estimated Blood Loss:     Estimated blood loss: none. Impression:               - Two 2 to 3 mm polyps in the ascending colon,                            removed with a cold snare. Resected and retrieved.                           - Diverticulosis in the entire examined colon.                           -  No obvious residual adenoma at the previous                            excision site adjacent to the marking tattoo.                            Status post biopsies                           - The examination was otherwise normal on direct                            and retroflexion views. Recommendation:           - Repeat colonoscopy in 3 years for surveillance.                           - Resume Xarelto (rivaroxaban) today at prior dose.                           - Patient has a contact number available for                            emergencies. The signs and symptoms of potential                            delayed complications were discussed with the                            patient. Return to normal activities tomorrow.                            Written discharge instructions were provided to the                            patient.                           - Resume previous diet.                           - Continue present medications.                           - Await pathology results. Docia Chuck. Henrene Pastor, MD 08/01/2020 2:20:26 PM This report has been signed electronically.

## 2020-08-01 NOTE — Progress Notes (Signed)
VS- JD 

## 2020-08-01 NOTE — Patient Instructions (Signed)
YOU HAD AN ENDOSCOPIC PROCEDURE TODAY AT THE [facility]:   Refer to the procedure report that was given to you for any specific questions about what was found during the examination.  If the procedure report does not answer your questions, please call your gastroenterologist to clarify.  If you requested that your care partner not be given the details of your procedure findings, then the procedure report has been included in a sealed envelope for you to review at your convenience later.  YOU SHOULD EXPECT: Some feelings of bloating in the abdomen. Passage of more gas than usual.  Walking can help get rid of the air that was put into your GI tract during the procedure and reduce the bloating. If you had a lower endoscopy (such as a colonoscopy or flexible sigmoidoscopy) you may notice spotting of blood in your stool or on the toilet paper. If you underwent a bowel prep for your procedure, you may not have a normal bowel movement for a few days.  Please Note:  You might notice some irritation and congestion in your nose or some drainage.  This is from the oxygen used during your procedure.  There is no need for concern and it should clear up in a day or so.  SYMPTOMS TO REPORT IMMEDIATELY:   Following lower endoscopy (colonoscopy or flexible sigmoidoscopy):  Excessive amounts of blood in the stool  Significant tenderness or worsening of abdominal pains  Swelling of the abdomen that is new, acute  Fever of 100F or higher  For urgent or emergent issues, a gastroenterologist can be reached at any hour by calling [phone]. Do not use MyChart messaging for urgent concerns.    DIET:  We do recommend a small meal at first, but then you may proceed to your regular diet.  Drink plenty of fluids but you should avoid alcoholic beverages for 24 hours.  ACTIVITY:  You should plan to take it easy for the rest of today and you should NOT DRIVE or use heavy machinery until tomorrow (because  of the sedation medicines used during the test).    FOLLOW UP: Our staff will call the number listed on your records 48-72 hours following your procedure to check on you and address any questions or concerns that you may have regarding the information given to you following your procedure. If we do not reach you, we will leave a message.  We will attempt to reach you two times.  During this call, we will ask if you have developed any symptoms of COVID 19. If you develop any symptoms (ie: fever, flu-like symptoms, shortness of breath, cough etc.) before then, please call [phone].  If you test positive for Covid 19 in the 2 weeks post procedure, please call and report this information to us.    If any biopsies were taken you will be contacted by phone or by letter within the next 1-3 weeks.  Please call us at [phone] if you have not heard about the biopsies in 3 weeks.    SIGNATURES/CONFIDENTIALITY: You and/or your care partner have signed paperwork which will be entered into your electronic medical record.  These signatures attest to the fact that that the information above on your After Visit Summary has been reviewed and is understood.  Full responsibility of the confidentiality of this discharge information lies with you and/or your care-partner. 

## 2020-08-03 ENCOUNTER — Telehealth: Payer: Self-pay | Admitting: *Deleted

## 2020-08-03 NOTE — Telephone Encounter (Signed)
  Follow up Call-  Call back number 08/01/2020 07/23/2018  Post procedure Call Back phone  # [phone]  Permission to leave phone message Yes Yes  Some recent data might be hidden     Patient questions:  Do you have a fever, pain , or abdominal swelling? No. Pain Score  0 *  Have you tolerated food without any problems? Yes.    Have you been able to return to your normal activities? Yes.    Do you have any questions about your discharge instructions: Diet   No. Medications  No. Follow up visit  No.  Do you have questions or concerns about your Care? No.  Actions: * If pain score is 4 or above: 1. No action needed, pain <4.Have you developed a fever since your procedure? no  2.   Have you had an respiratory symptoms (SOB or cough) since your procedure? no  3.   Have you tested positive for COVID 19 since your procedure no  4.   Have you had any family members/close contacts diagnosed with the COVID 19 since your procedure?  no   If yes to any of these questions please route to Joylene John, RN and Joella Prince, RN

## 2020-08-06 ENCOUNTER — Ambulatory Visit: Payer: PPO | Admitting: Adult Health

## 2020-08-09 ENCOUNTER — Encounter: Payer: Self-pay | Admitting: Internal Medicine

## 2020-09-04 ENCOUNTER — Encounter: Payer: Self-pay | Admitting: Cardiovascular Disease

## 2020-09-04 ENCOUNTER — Ambulatory Visit: Payer: PPO | Admitting: Cardiovascular Disease

## 2020-09-04 ENCOUNTER — Other Ambulatory Visit: Payer: Self-pay

## 2020-09-04 VITALS — BP 135/62 | HR 77 | Ht 73.0 in | Wt 173.0 lb

## 2020-09-04 DIAGNOSIS — R0989 Other specified symptoms and signs involving the circulatory and respiratory systems: Secondary | ICD-10-CM

## 2020-09-04 DIAGNOSIS — I480 Paroxysmal atrial fibrillation: Secondary | ICD-10-CM | POA: Diagnosis not present

## 2020-09-04 DIAGNOSIS — E782 Mixed hyperlipidemia: Secondary | ICD-10-CM

## 2020-09-04 DIAGNOSIS — I739 Peripheral vascular disease, unspecified: Secondary | ICD-10-CM

## 2020-09-04 DIAGNOSIS — F17200 Nicotine dependence, unspecified, uncomplicated: Secondary | ICD-10-CM

## 2020-09-04 NOTE — Patient Instructions (Addendum)
Medication Instructions:  Your physician recommends that you continue on your current medications as directed. Please refer to the Current Medication list given to you today.  *If you need a refill on your cardiac medications before your next appointment, please call your pharmacy*   Testing/Procedures: Your physician has requested that you have a lower extremity arterial duplex. This test is an ultrasound of the arteries in the legs. It looks at arterial blood flow in the legs. Allow one hour for Lower Arterial scans. There are no restrictions or special instructions  Your physician has requested that you have an abdominal aorta duplex. During this test, an ultrasound is used to evaluate the aorta. Allow 30 minutes for this exam. Do not eat after midnight the day before and avoid carbonated beverages  Your physician has requested that you have an ankle brachial index (ABI). During this test an ultrasound and blood pressure cuff are used to evaluate the arteries that supply the arms and legs with blood. Allow thirty minutes for this exam. There are no restrictions or special instructions.  These procedures are done at [facility]. 2nd Floor. To be done in June 2022.   Follow-Up: At [facility], you and your health needs are our priority.  As part of our continuing mission to provide you with exceptional heart care, we have created designated Provider Care Teams.  These Care Teams include your primary Cardiologist (physician) and Advanced Practice Providers (APPs -  Physician Assistants and Nurse Practitioners) who all work together to provide you with the care you need, when you need it.  We recommend signing up for the patient portal called "MyChart".  Sign up information is provided on this After Visit Summary.  MyChart is used to connect with patients for Virtual Visits (Telemedicine).  Patients are able to view lab/test results, encounter notes, upcoming appointments, etc.   Non-urgent messages can be sent to your provider as well.   To learn more about what you can do with MyChart, go to [email or web address].    Your next appointment:   12 month(s)  The format for your next appointment:   In Person  Provider:   Quay Burow, MD   Other Instructions Referral made to see Dr. Debara Pickett in the Advanced lipid clinic.

## 2020-09-04 NOTE — Assessment & Plan Note (Signed)
History of A. fib ablation by Dr. Caryl Comes back in 2011.  He is on Tikosyn and Xarelto remaining in sinus rhythm.  He does complain of some epistaxis and has been taking the Xarelto every other day which I cautioned him against.

## 2020-09-04 NOTE — Progress Notes (Signed)
09/04/2020 Bradley Ferguson   04-22-1951  [id number]  Primary Physician [name], MD Primary Cardiologist: Lorretta Harp MD [name], [place]  HPI:  Bradley Ferguson is a 70 y.o.  separated, father of 59, grandfather one grandchild who works as a Systems developer. He was referred by Dr.McKeownfor peripheral vascular evaluation because of life limiting claudication.I last saw him in the office 06/06/2020.Marland KitchenHis risk factors include treated hyperlipidemia and 45 pack years of tobacco abuse continued to smoke one pack per day. Never had a heart attack or stroke. He does complain of some dyspnea on exertion probably related to COPD. He's had A. fib ablation back in 2011 by Dr. Caryl Comes but has not followed up since. He has complained of less limiting claudication over the last year which is symmetric and bilateral. He can walk one block after which she has to stop because of discomfort. He has had lower extremity arterial Doppler studies in our office 04/11/17 revealing a high-frequency signal in his right common iliac artery and a total left SFA.  I performed peripheral angiography on him 04/27/17 revealing a 99% calcified eccentric proximal right common iliac artery stenosis and bilateral long segment SFA chronic total occlusions which were highly calcified. I performed direct back orbital rotational atherectomy, PTCA and cover stenting using a 7 mm x 39 mm long VBX covered stent. Excellent angiographic result. The claudication has somewhat improved as has his Doppler studies. I do not think his SFAs are easily percutaneously addressable.  He continues to complain of some continued claudication. He continues to smoke as well. He also complains of some shortness of breath.   He had Dopplers performed 12/21/2019 revealing ABIs of 0.7 range bilaterally with a patent right iliac stent.  I believe he would benefit from the addition of Pletal.  We will explore this with our Pharm.D. to determine if  there are any drug drug interactions.  He is also not at goal on high-dose atorvastatin and may benefit from being on a PCSK9 inhibitor as well.    Since I saw him 3 months ago his Pletal was discontinued because of QT interval prolongation.  He did however derive some benefit from this.  He is currently smoking 1/2 pack/day.  He denies chest pain or shortness of breath.   Current Meds  Medication Sig   ALPRAZolam (XANAX) 1 MG tablet Take 1/2-1 tablet 2 - 3 x /day ONLY if needed for Anxiety Attack &  limit to 5 days /week to avoid addiction (Patient taking differently: Take 1/2-1 tablet 2 - 3 x /day ONLY if needed for Anxiety Attack &  limit to 5 days /week to avoid addiction)   aspirin EC 81 MG tablet Take 81 mg by mouth daily. Swallow whole.   atorvastatin (LIPITOR) 80 MG tablet Take 1 tablet Daily for Cholesterol   Budeson-Glycopyrrol-Formoterol (BREZTRI AEROSPHERE) 160-9-4.8 MCG/ACT AERO Inhale 1 puff into the lungs in the morning and at bedtime.   Cholecalciferol 250 MCG (10000 UT) CAPS Take 10,000 Units by mouth daily.    dofetilide (TIKOSYN) 500 MCG capsule Take 1 capsule (500 mcg total) by mouth 2 (two) times daily.   isoniazid (NYDRAZID) 300 MG tablet Take 1 tablet (300 mg total) by mouth daily.   ketoconazole (NIZORAL) 2 % cream Apply to rash 1 to 2 x /day   pyridOXINE (VITAMIN B-6) 100 MG tablet Take 100 mg by mouth daily.   rivaroxaban (XARELTO) 20 MG TABS tablet Take 1 tablet Daily  to Prevent Blood Clots   triamcinolone cream (KENALOG) 0.5 % Apply 1 application topically 2 (two) times daily. 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  Tobacco Use   Smoking status: Current Every Day Smoker    Packs/day: 0.75    Years: 50.00    Pack years: 37.50    Types: Cigarettes    Start date: 83   Smokeless tobacco: Never Used  Scientific laboratory technician Use: Never used  Substance and Sexual Activity   Alcohol use: Yes    Alcohol/week: 2.0 standard drinks    Types: 2 Standard drinks or equivalent per week    Comment: 2 drinks per week    Drug use: Never   Sexual activity: Not on file  Other Topics Concern   Not on file  Social History Narrative   Not on file   Social Determinants of Health   Financial Resource Strain: Not on file  Food Insecurity: Not on file  Transportation Needs: Not on file  Physical Activity: Not on file  Stress: Not on file  Social Connections: Not on file  Intimate Partner Violence: Not on file     Review of Systems: General: negative for chills, fever, night sweats or weight changes.  Cardiovascular: negative for chest pain, dyspnea on exertion, edema, orthopnea, palpitations, paroxysmal nocturnal dyspnea or shortness of breath Dermatological: negative for rash Respiratory: negative for cough or wheezing Urologic: negative for hematuria Abdominal: negative for nausea, vomiting, diarrhea, bright red blood per rectum, melena, or hematemesis Neurologic: negative for visual changes, syncope, or dizziness All other systems reviewed and are otherwise negative except as noted above.    Blood pressure 135/62, pulse 77, height 6\' 1"  (1.854 m), weight 173 lb (78.5 kg), SpO2 98 %.  General appearance: alert and no distress Neck: no adenopathy, no carotid bruit, no JVD, supple, symmetrical, trachea midline and thyroid not enlarged, symmetric, no tenderness/mass/nodules Lungs: clear to auscultation bilaterally Heart: regular rate and rhythm,  S1, S2 normal, no murmur, click, rub or gallop Extremities: extremities normal, atraumatic, no cyanosis or edema Pulses: 2+ and symmetric Skin: Skin color, texture, turgor normal. No rashes or lesions Neurologic: Alert and oriented X 3, normal strength and tone. Normal symmetric reflexes. Normal coordination and gait  EKG sinus rhythm at 77 with right bundle branch block.  I personally reviewed this EKG.  ASSESSMENT AND PLAN:   TOBACCO ABUSE History of ongoing tobacco abuse of 1/2 pack/day down from 1 pack/day.  Atrial fibrillation ([place]) History of A. fib ablation by Dr. Caryl Comes back in 2011.  He is on Tikosyn and Xarelto remaining in sinus rhythm.  He does complain of some epistaxis and has been taking the Xarelto every other day which I cautioned him against.  Hyperlipidemia, mixed History of hyperlipidemia on statin therapy with lipid profile performed 04/30/2020 revealing total cholesterol 200, LDL 131 and HDL 43, not at goal for secondary  prevention.  He may be a candidate for PCSK9.  I am referring him to Dr. Debara Pickett for further evaluation and treatment.  Labile hypertension History of labile hypertension blood pressure measured today 135/62.  He is not on antihypertensive medications.  Claudication in peripheral vascular disease ([place]) History of peripheral arterial disease with lifestyle limiting claudication status post orbital atherectomy, VBX covered stenting about 99% calcified proximal right common iliac artery stenosis 04/27/2017.  He had 40% left common iliac artery stenosis with a 10 mm gradient, occluded SFAs bilaterally with three-vessel runoff.  I do not think his SFAs were percutaneously addressable.  His most recent Doppler studies performed 12/21/2019 revealed a right ABI of 0.71 and a left of 0.73 with patent iliac stent.  I did try him on Pletal which afforded him some benefit but had to be stopped because of daily interval prolongation.      Lorretta Harp MD  FACP,FACC,FAHA, [facility] 09/04/2020 11:53 AM

## 2020-09-04 NOTE — Assessment & Plan Note (Signed)
History of hyperlipidemia on statin therapy with lipid profile performed 04/30/2020 revealing total cholesterol 200, LDL 131 and HDL 43, not at goal for secondary prevention.  He may be a candidate for PCSK9.  I am referring him to Dr. Debara Pickett for further evaluation and treatment.

## 2020-09-04 NOTE — Assessment & Plan Note (Signed)
History of ongoing tobacco abuse of 1/2 pack/day down from 1 pack/day.

## 2020-09-04 NOTE — Assessment & Plan Note (Signed)
History of peripheral arterial disease with lifestyle limiting claudication status post orbital atherectomy, VBX covered stenting about 99% calcified proximal right common iliac artery stenosis 04/27/2017.  He had 40% left common iliac artery stenosis with a 10 mm gradient, occluded SFAs bilaterally with three-vessel runoff.  I do not think his SFAs were percutaneously addressable.  His most recent Doppler studies performed 12/21/2019 revealed a right ABI of 0.71 and a left of 0.73 with patent iliac stent.  I did try him on Pletal which afforded him some benefit but had to be stopped because of daily interval prolongation.

## 2020-09-04 NOTE — Assessment & Plan Note (Signed)
History of labile hypertension blood pressure measured today 135/62.  He is not on antihypertensive medications.

## 2020-09-19 ENCOUNTER — Encounter: Payer: Self-pay | Admitting: Internal Medicine

## 2020-09-19 ENCOUNTER — Other Ambulatory Visit: Payer: Self-pay

## 2020-09-19 ENCOUNTER — Ambulatory Visit (INDEPENDENT_AMBULATORY_CARE_PROVIDER_SITE_OTHER): Payer: PPO | Admitting: Internal Medicine

## 2020-09-19 VITALS — BP 114/72 | HR 80 | Temp 97.9°F | Resp 16 | Ht 74.0 in | Wt 171.2 lb

## 2020-09-19 DIAGNOSIS — H00012 Hordeolum externum right lower eyelid: Secondary | ICD-10-CM | POA: Diagnosis not present

## 2020-09-19 DIAGNOSIS — H0100A Unspecified blepharitis right eye, upper and lower eyelids: Secondary | ICD-10-CM

## 2020-09-19 MED ORDER — NEOMYCIN-POLYMYXIN-DEXAMETH 3.5-10000-0.1 OP SUSP: OPHTHALMIC | 1 refills | Status: DC

## 2020-09-19 MED ORDER — DOXYCYCLINE HYCLATE 100 MG PO CAPS: ORAL_CAPSULE | ORAL | 0 refills | Status: DC

## 2020-09-19 NOTE — Patient Instructions (Signed)
Bacterial Conjunctivitis, Adult Bacterial conjunctivitis is an infection of the clear membrane that covers the white part of your eye and the inner surface of your eyelid (conjunctiva). When the blood vessels in your conjunctiva become inflamed, your eye becomes red or pink, and it will probably feel itchy. Bacterial conjunctivitis spreads very easily from person to person (is contagious). It also spreads easily from one eye to the other eye. What are the causes? This condition is caused by bacteria. You may get the infection if you come into close contact with:  A person who is infected with the bacteria.  Items that are contaminated with the bacteria, such as a face towel, contact lens solution, or eye makeup. What increases the risk? You are more likely to develop this condition if you:  Are exposed to other people who have the infection.  Wear contact lenses.  Have a sinus infection.  Have had a recent eye injury or surgery.  Have a weak body defense system (immune system).  Have a medical condition that causes dry eyes. What are the signs or symptoms? Symptoms of this condition include:  Thick, yellowish discharge from the eye. This may turn into a crust on the eyelid overnight and cause your eyelids to stick together.  Tearing or watery eyes.  Itchy eyes.  Burning feeling in your eyes.  Eye redness.  Swollen eyelids.  Blurred vision.   How is this diagnosed? This condition is diagnosed based on your symptoms and medical history. Your health care provider may also take a sample of discharge from your eye to find the cause of your infection. This is rarely done. How is this treated? This condition may be treated with:  Antibiotic eye drops or ointment to clear the infection more quickly and prevent the spread of infection to others.  Oral antibiotic medicines to treat infections that do not respond to drops or ointments or that last longer than 10 days.  Cool, wet  cloths (cool compresses) placed on the eyes.  Artificial tears applied 2-6 times a day.   Follow these instructions at home: Medicines  Take or apply your antibiotic medicine as told by your health care provider. Do not stop taking or applying the antibiotic even if you start to feel better.  Take or apply over-the-counter and prescription medicines only as told by your health care provider.  Be very careful to avoid touching the edge of your eyelid with the eye-drop bottle or the ointment tube when you apply medicines to the affected eye. This will keep you from spreading the infection to your other eye or to other people. Managing discomfort  Gently wipe away any drainage from your eye with a warm, wet washcloth or a cotton ball.  Apply a clean, cool compress to your eye for 10-20 minutes, 3-4 times a day. General instructions  Do not wear contact lenses until the inflammation is gone and your health care provider says it is safe to wear them again. Ask your health care provider how to sterilize or replace your contact lenses before you use them again. Wear glasses until you can resume wearing contact lenses.  Avoid wearing eye makeup until the inflammation is gone. Throw away any old eye cosmetics that may be contaminated.  Change or wash your pillowcase every day.  Do not share towels or washcloths. This may spread the infection.  Wash your hands often with soap and water. Use paper towels to dry your hands.  Avoid touching or rubbing your  eyes.  Do not drive or use heavy machinery if your vision is blurred. Contact a health care provider if:  You have a fever.  Your symptoms do not get better after 10 days. Get help right away if you have:  A fever and your symptoms suddenly get worse.  Severe pain when you move your eye.  Facial pain, redness, or swelling.  Sudden loss of vision. Summary  Bacterial conjunctivitis is an infection of the clear membrane that covers  the white part of your eye and the inner surface of your eyelid (conjunctiva).  Bacterial conjunctivitis spreads very easily from person to person (is contagious).  Wash your hands often with soap and water. Use paper towels to dry your hands.  Take or apply your antibiotic medicine as told by your health care provider. Do not stop taking or applying the antibiotic even if you start to feel better.  Contact a health care provider if you have a fever or your symptoms do not get better after 10 days. This information is not intended to replace advice given to you by your health care provider. Make sure you discuss any questions you have with your health care provider. Document Revised: 10/05/2018 Document Reviewed: 01/20/2018 Elsevier Patient Education  2021 [place], also known as a hordeolum, is a bump that forms on an eyelid. It may look like a pimple next to the eyelash. A stye can form inside the eyelid (internal stye) or outside the eyelid (external stye). A stye can cause redness, swelling, and pain on the eyelid. Styes are very common. Anyone can get them at any age. They usually occur in just one eye, but you may have more than one in either eye. What are the causes? A stye is caused by an infection. The infection is almost always caused by bacteria called Staphylococcus aureus. This is a common type of bacteria that lives on the skin. An internal stye may result from an infected oil-producing gland inside the eyelid. An external stye may be caused by an infection at the base of the eyelash (hair follicle). What increases the risk? You are more likely to develop a stye if:  You have had a stye before.  You have any of these conditions: ? Diabetes. ? Red, itchy, inflamed eyelids (blepharitis). ? A skin condition such as seborrheic dermatitis or rosacea. ? High fat levels in your blood (lipids). What are the signs or symptoms? The most common symptom of a stye is  eyelid pain. Internal styes are more painful than external styes. Other symptoms may include:  Painful swelling of your eyelid.  A scratchy feeling in your eye.  Tearing and redness of your eye.  Pus draining from the stye.   How is this diagnosed? Your health care provider may be able to diagnose a stye just by examining your eye. The health care provider may also check to make sure:  You do not have a fever or other signs of a more serious infection.  The infection has not spread to other parts of your eye or areas around your eye. How is this treated? Most styes will clear up in a few days without treatment or with warm compresses applied to the area. You may need to use antibiotic drops or ointment to treat an infection. In some cases, if your stye does not heal with routine treatment, your health care provider may drain pus from the stye using a thin blade or needle. This  may be done if the stye is large, causing a lot of pain, or affecting your vision. Follow these instructions at home:  Take over-the-counter and prescription medicines only as told by your health care provider. This includes eye drops or ointments.  If you were prescribed an antibiotic medicine, apply or use it as told by your health care provider. Do not stop using the antibiotic even if your condition improves.  Apply a warm, wet cloth (warm compress) to your eye for 5-10 minutes, 4 times a day.  Clean the affected eyelid as directed by your health care provider.  Do not wear contact lenses or eye makeup until your stye has healed.  Do not try to pop or drain the stye.  Do not rub your eye. Contact a health care provider if:  You have chills or a fever.  Your stye does not go away after several days.  Your stye affects your vision.  Your eyeball becomes swollen, red, or painful. Get help right away if:  You have pain when moving your eye around. Summary  A stye is a bump that forms on an eyelid.  It may look like a pimple next to the eyelash.  A stye can form inside the eyelid (internal stye) or outside the eyelid (external stye). A stye can cause redness, swelling, and pain on the eyelid.  Your health care provider may be able to diagnose a stye just by examining your eye.  Apply a warm, wet cloth (warm compress) to your eye for 5-10 minutes, 4 times a day. This information is not intended to replace advice given to you by your health care provider. Make sure you discuss any questions you have with your health care provider. Document Revised: 02/23/2020 Document Reviewed: 02/23/2020 Elsevier Patient Education  [place].

## 2020-09-19 NOTE — Progress Notes (Signed)
History of Present Illness:     Patient is a very nice 70 yo [name] with HTN, ASHD/pAfib, ASPVD, HLD, Prediabetes,Vitamin D Deficiency, BipolarDisorder /Depression,  Systemic Sclerosis and  Interstitial Pulm Fibrosis who presents today with c/o of a  (Rt) "red" eye.   Medications  .  atorvastatin (LIPITOR) 80 MG, Take 1 tablet Daily   .  dofetilide (TIKOSYN) 500 MCG , Take 1 capsule  2 times daily  .  BREZTRI AEROSPHERE , Inhale 1 puff into the lungs in the morning and at bedtime.  Marland Kitchen  aspirin EC 81 MG , Take  daily  .  rivaroxaban (XARELTO) 20 MG , Take 1 tablet Daily  .  ALPRAZolam  1 MG tablet, Take 1/2-1 tablet 2 - 3 x /day ONLY if needed   .  Cholecalciferol 10,000 u, Take  daily.   .  Isoniazid  300 MG tablet, Take 1 tablet  Daily.  Marland Kitchen  ketoconazole \ 2 % cream, Apply to rash 1 to 2 x /day  .  neomycin-polymyxin b-dexamethasone \ SUSP, Use  1 to 2 drops  to affected eye  4 x /day  . VITAMIN B-6   100 MG tablet, Take daily.  Marland Kitchen  triamcinolone crm 0.5 %, Apply 1 application topically daily as needed (rash).)  Problem list He has ANXIETY DEPRESSION; TOBACCO ABUSE; RAYNAUDS SYNDROME; Attention deficit disorder; Dental caries; Bipolar I disorder ([place]); Atrial fibrillation ([place]); Hyperlipidemia, mixed; Vitamin D deficiency; Labile hypertension; Other abnormal glucose (prediabetes); BPH; Claudication in peripheral vascular disease ([place]); Visit for monitoring Tikosyn therapy; Colon polyps; Acquired thrombophilia ([place]); Atherosclerosis of aorta ([place]); Chronic obstructive pulmonary disease ([place]); Centrilobular emphysema ([place]); Interstitial pulmonary disease ([place]); Positive ANA (antinuclear antibody); TB lung, latent; Systemic scleroses ([place]); Right inguinal hernia; Essential hypertension; Corn of foot; Elevated LFTs; Transaminitis; and Hepatic steatosis on their problem list.   Observations/Objective:   BP 114/72   Pulse 80   Temp 97.9 F (36.6 C)   Resp 16   Ht 6\' 2"  (1.88 m)    Wt 171 lb 3.2 oz (77.7 kg)   BMI 21.98 kg/m   HEENT -  EAC's /TM's - both Nl. EOM's conjugate. PERRLA .  Rt conjunctiva injected 2-3 + and there is a sty of the mid lower Lt lid Neck - supple.  Chest - Clear equal BS. Cor - Nl HS. RRR w/o sig MGR. PP 1(+). No edema. MS- FROM w/o deformities.  Gait Nl. Neuro -  Nl w/o focal abnormalities.  Assessment and Plan:   1. Hordeolum externum of right lower eyelid  - neomycin-polymyxin b-dexamethasone (MAXITROL)  SUSP; Use  1 to 2 drops  to affected eye  4 x /day  Disp: 5 mL; Refill: 1  - doxycycline (VIBRAMYCIN) 100 MG capsule; Take 1 capsule 2 x /day with meals for Infection  Dispense: 60 capsule; Refill: 0  2. Blepharitis of both upper and lower eyelid of right eye, unspecified type  - neomycin-polymyxin b-dexamethasone (MAXITROL) 3.5-10000-0.1 SUSP; Use  1 to 2 drops  to affected eye  4 x /day  Dispense: 5 mL; Refill: 1  - doxycycline (VIBRAMYCIN) 100 MG capsule; Take 1 capsule 2 x /day with meals for Infection  Dispense: 60 capsule; Refill: 0     Follow Up Instructions:       I discussed the assessment and treatment plan with the patient. The patient was provided an opportunity to ask questions and all were answered. The patient agreed with the plan and demonstrated  an understanding of the instructions.       The patient was advised to call back or seek an in-person evaluation if the symptoms worsen or if the condition fails to improve as anticipated.  Kirtland Bouchard, MD

## 2020-09-25 ENCOUNTER — Telehealth: Payer: Self-pay | Admitting: Internal Medicine

## 2020-09-25 NOTE — Telephone Encounter (Signed)
patient called to request ENT referral. Patient states he has a history of nose bleeds of right nostril when coughing, sneezing, or sometimes when smoking. More recent nose bleeds while in bed while at rest.  Referral sent to ENT per Dr Melford Aase.  Also reports dark brown urination stream for a few seconds this morning then turning normal light yellow at first morning urination. Advised Dr [name].

## 2020-09-26 ENCOUNTER — Other Ambulatory Visit: Payer: Self-pay | Admitting: Internal Medicine

## 2020-09-26 DIAGNOSIS — E782 Mixed hyperlipidemia: Secondary | ICD-10-CM

## 2020-09-26 DIAGNOSIS — I480 Paroxysmal atrial fibrillation: Secondary | ICD-10-CM

## 2020-09-26 MED ORDER — ATORVASTATIN CALCIUM 80 MG PO TABS
ORAL_TABLET | ORAL | 3 refills | Status: DC
Start: 2020-09-26 — End: 2022-02-06

## 2020-09-26 MED ORDER — RIVAROXABAN 20 MG PO TABS
ORAL_TABLET | ORAL | 3 refills | Status: DC
Start: 2020-09-26 — End: 2022-02-06

## 2020-09-27 NOTE — Progress Notes (Signed)
Future Appointments  Date Time Provider [place]  09/28/2020 11:30 AM [name], MD [facility] None  10/10/2020  9:00 AM Pixie Casino, MD [facility]  11/12/2020 10:30 AM [name], MD [facility] None  12/07/2020 10:30 AM Constance Haw, MD [facility]  12/20/2020  9:00 AM [facility] VASC 4 [facility]  12/20/2020 10:00 AM [facility] NL VASC 4 [facility]  05/08/2021  2:00 PM [name], MD [facility] None  06/12/2021 11:15 AM Liane Comber, NP [facility] None    History of Present Illness:                                         Patient is a very nice 70 yo [name] with HTN, ASHD/pAfib, (on Xarelto), ASPVD, HLD, Prediabetes,Vitamin D Deficiency, BipolarDisorder /Depression,  Systemic Sclerosis and  Interstitial Pulm Fibrosis who presents with c/o blood in Urine.  Patient had recent epistaxis & is apparently scheduled to see an ENT Dr.  He denies any GU pain, dysuria or LUTS.  No pains to suggest Kidney stones.     Medications  .  atorvastatin (LIPITOR) 80 MG tablet, Take 1 tablet Daily for Cholesterol .  dofetilide (TIKOSYN) 500 MCG capsule, Take 1 capsule (500 mcg total) by mouth 2 (two) times daily.  Marland Kitchen  BREZTRI AEROSPHERE) 160-9-4.8 MCG/ACT AERO, Inhale 1 puff into the lungs in the morning and at bedtime.  Marland Kitchen  aspirin EC 81 MG tablet, Take 81 mg by mouth daily. Swallow whole.  .  rivaroxaban (XARELTO) 20 MG TABS tablet, Take 1 tablet Daily to Prevent Blood Clots  Current Outpatient Medications (Other):  Marland Kitchen  ALPRAZolam (XANAX) 1 MG tablet, Take 1/2-1 tablet 2 - 3 x /day ONLY if needed for Anxiety Attack &  limit to 5 days /week to avoid addiction (Patient taking differently: Take 1/2-1 tablet 2 - 3 x /day ONLY if needed for Anxiety Attack &  limit to 5 days /week to avoid addiction) .  Cholecalciferol 250 MCG (10000 UT) CAPS, Take 10,000 Units by mouth daily.  Marland Kitchen  doxycycline (VIBRAMYCIN) 100 MG capsule, Take 1 capsule 2 x /day  with meals for Infection .  isoniazid (NYDRAZID) 300 MG tablet, Take 1 tablet (300 mg total) by mouth daily. Marland Kitchen  ketoconazole (NIZORAL) 2 % cream, Apply to rash 1 to 2 x /day .  neomycin-polymyxin b-dexamethasone (MAXITROL) 3.5-10000-0.1 SUSP, Use  1 to 2 drops  to affected eye  4 x /day .  pyridOXINE (VITAMIN B-6) 100 MG tablet, Take 100 mg by mouth daily. Marland Kitchen  triamcinolone cream (KENALOG) 0.5 %, Apply 1 application topically 2 (two) times daily. (Patient taking differently: Apply 1 application topically daily as needed (rash).)  Problem list He has ANXIETY DEPRESSION; TOBACCO ABUSE; RAYNAUDS SYNDROME; Attention deficit disorder; Dental caries; Bipolar I disorder ([place]); Atrial fibrillation ([place]); Hyperlipidemia, mixed; Vitamin D deficiency; Labile hypertension; Other abnormal glucose (prediabetes); BPH; Claudication in peripheral vascular disease ([place]); Visit for monitoring Tikosyn therapy; Colon polyps; Acquired thrombophilia ([place]); Atherosclerosis of aorta ([place]); Chronic obstructive pulmonary disease ([place]); Centrilobular emphysema ([place]); Interstitial pulmonary disease ([place]); Positive ANA (antinuclear antibody); TB lung, latent; Systemic scleroses ([place]); Right inguinal hernia; Essential hypertension; Corn of foot; Elevated LFTs; and Hepatic steatosis on their problem list.   Observations/Objective:  BP 126/64   Pulse 82   Temp 98.1 F (36.7 C)   Resp 16   Ht 6\' 2"  (  1.88 m)   Wt 168 lb 12.8 oz (76.6 kg)   SpO2 99%   BMI 21.67 kg/m   HEENT - WNL. Neck - supple.  Chest - Clear equal BS. Cor - Nl HS. RRR w/o sig MGR. PP 1(+). No edema. Abd - Soft, benign w/o masses or tenderness.    GU - Prostate 1-2 + enlarged. Slightly boggy. Stool - hemoccult Negative.                                 MS- FROM w/o deformities.  Gait Nl. Neuro -  Nl w/o focal abnormalities.  Urine appears grossly bloody.  Assessment and Plan:  1.  Gros Hematuria (Painless)   - Urinalysis, Routine w reflex  microscopic - Urine Culture  - Disposition pending results of above.   Follow Up Instructions:        I discussed the assessment and treatment plan with the patient. The patient was provided an opportunity to ask questions and all were answered. The patient agreed with the plan and demonstrated an understanding of the instructions.       The patient was advised to call back or seek an in-person evaluation if the symptoms worsen or if the condition fails to improve as anticipated.   Kirtland Bouchard, MD

## 2020-09-28 ENCOUNTER — Ambulatory Visit (INDEPENDENT_AMBULATORY_CARE_PROVIDER_SITE_OTHER): Payer: PPO | Admitting: Internal Medicine

## 2020-09-28 ENCOUNTER — Other Ambulatory Visit: Payer: Self-pay

## 2020-09-28 ENCOUNTER — Encounter: Payer: Self-pay | Admitting: Internal Medicine

## 2020-09-28 VITALS — BP 126/64 | HR 82 | Temp 98.1°F | Resp 16 | Ht 74.0 in | Wt 168.8 lb

## 2020-09-28 DIAGNOSIS — R310 Gross hematuria: Secondary | ICD-10-CM

## 2020-09-28 NOTE — Patient Instructions (Signed)

## 2020-09-30 ENCOUNTER — Other Ambulatory Visit: Payer: Self-pay | Admitting: Internal Medicine

## 2020-09-30 DIAGNOSIS — R310 Gross hematuria: Secondary | ICD-10-CM

## 2020-09-30 LAB — URINALYSIS, ROUTINE W REFLEX MICROSCOPIC
Bacteria, UA: NONE SEEN /HPF
Bilirubin Urine: NEGATIVE
Glucose, UA: NEGATIVE
Hyaline Cast: NONE SEEN /LPF
Ketones, ur: NEGATIVE
Nitrite: NEGATIVE
RBC / HPF: >=60 /HPF — AB (ref 0–2)
Specific Gravity, Urine: 1.017 (ref 1.001–1.03)
Squamous Epithelial / HPF: NONE SEEN /HPF (ref <=5)
pH: 5.5 (ref 5.0–8.0)

## 2020-09-30 LAB — URINE CULTURE
MICRO NUMBER:: 11725488
Result:: NO GROWTH
SPECIMEN QUALITY:: ADEQUATE

## 2020-09-30 LAB — MICROSCOPIC MESSAGE

## 2020-09-30 NOTE — Progress Notes (Signed)
============================================================ ============================================================  -     Urine Culture is Negative - No Infection.                                              So requested consult with [facility] ============================================================ ============================================================

## 2020-10-10 ENCOUNTER — Other Ambulatory Visit: Payer: PPO

## 2020-10-10 ENCOUNTER — Telehealth (INDEPENDENT_AMBULATORY_CARE_PROVIDER_SITE_OTHER): Payer: PPO | Admitting: Internal Medicine

## 2020-10-10 ENCOUNTER — Encounter: Payer: Self-pay | Admitting: Internal Medicine

## 2020-10-10 ENCOUNTER — Other Ambulatory Visit: Payer: Self-pay

## 2020-10-10 ENCOUNTER — Other Ambulatory Visit: Payer: Self-pay | Admitting: Internal Medicine

## 2020-10-10 DIAGNOSIS — R945 Abnormal results of liver function studies: Secondary | ICD-10-CM

## 2020-10-10 DIAGNOSIS — E782 Mixed hyperlipidemia: Secondary | ICD-10-CM | POA: Diagnosis not present

## 2020-10-10 DIAGNOSIS — R310 Gross hematuria: Secondary | ICD-10-CM

## 2020-10-10 DIAGNOSIS — F17200 Nicotine dependence, unspecified, uncomplicated: Secondary | ICD-10-CM | POA: Diagnosis not present

## 2020-10-10 DIAGNOSIS — R972 Elevated prostate specific antigen [PSA]: Secondary | ICD-10-CM | POA: Diagnosis not present

## 2020-10-10 DIAGNOSIS — Z79899 Other long term (current) drug therapy: Secondary | ICD-10-CM

## 2020-10-10 DIAGNOSIS — I739 Peripheral vascular disease, unspecified: Secondary | ICD-10-CM | POA: Diagnosis not present

## 2020-10-10 DIAGNOSIS — R7989 Other specified abnormal findings of blood chemistry: Secondary | ICD-10-CM

## 2020-10-10 NOTE — Progress Notes (Signed)
Virtual Visit via Video Note   This visit type was conducted due to national recommendations for restrictions regarding the COVID-19 Pandemic (e.g. social distancing) in an effort to limit this patient's exposure and mitigate transmission in our community.  Due to his co-morbid illnesses, this patient is at least at moderate risk for complications without adequate follow up.  This format is felt to be most appropriate for this patient at this time.  All issues noted in this document were discussed and addressed.  A limited physical exam was performed with this format.  Please refer to the patient's chart for his consent to telehealth for [facility].  Video Connection Lost Video connection was lost at < 50% of the duration of this visit, at which time the remainder of the visit was completed via audio only.    Date:  10/10/2020   ID:  Bradley Ferguson, DOB 01/01/1951, MRN [id number] The patient was identified using 2 identifiers.  Evaluation Performed:  New Patient Evaluation  Patient Location:  [place] [address]  Provider location:   [address], [address] [place], [place] [address]  PCP:  [name], MD  Cardiologist:  Quay Burow, MD Electrophysiologist:  [name], MD   Chief Complaint:  Manage dyslipidemia  History of Present Illness:    Bradley Ferguson is a 70 y.o. male who presents via audio/video conferencing for a telehealth visit today.  This is a 78-year-old male with a history of PAD and dyslipidemia.  He is actually had well-controlled lipids on 80 mg atorvastatin for several years.  This past November for some reason his LDL more than doubled up to 131.  Typically this is related to discontinuing the medicine however he says he continue to take the medicine and does not seem to have any side effects with it.  He was subsequently referred to see Tommy Medal, PharmD in our lipid clinic who had discussed starting a PCSK9 inhibitor  and was going to pursue prior authorization however it appears that that never happened.  He has not gotten the medication.  He has not had repeat lipid testing since then.  The patient does not have symptoms concerning for COVID-19 infection (fever, chills, cough, or new SHORTNESS OF BREATH).    Prior CV studies:   The following studies were reviewed today:  Chart reviewed, lab work  PMHx:  Past Medical History:  Diagnosis Date  . Anxiety   . Atrial fibrillation ([place])   . Blood transfusion without reported diagnosis 1976  . Cataract    removed bilaterally  . Depression   . Dysrhythmia   . Heart murmur   . PVD (peripheral vascular disease) ([place])   . Raynaud's syndrome   . Systemic sclerosis ([place])   . TB lung, latent 12/19/2019  . Transaminitis 06/18/2020    Past Surgical History:  Procedure Laterality Date  . ATRIAL FIBRILLATION ABLATION    . CATARACT EXTRACTION Right   . COLONOSCOPY    . EYE SURGERY    . FRACTURE SURGERY Left 1976  . INGUINAL HERNIA REPAIR Right 03/23/2020   Procedure: OPEN RIGHT INGUINAL HERNIA REPAIR WITH MESH;  Surgeon: Johnathan Hausen, MD;  Location: [place];  Service: General;  Laterality: Right;  . INSERTION OF MESH Right 03/23/2020   Procedure: INSERTION OF MESH;  Surgeon: Johnathan Hausen, MD;  Location: [place];  Service: General;  Laterality: Right;  . LOWER EXTREMITY INTERVENTION N/A 04/27/2017   Procedure: LOWER EXTREMITY INTERVENTION;  Surgeon:  Lorretta Harp, MD;  Location: [place] [facility];  Service: Cardiovascular;  Laterality: N/A;  . PERIPHERAL VASCULAR ATHERECTOMY Right 04/27/2017   Procedure: PERIPHERAL VASCULAR ATHERECTOMY;  Surgeon: Lorretta Harp, MD;  Location: [place] [facility];  Service: Cardiovascular;  Laterality: Right;  Iliac  . PERIPHERAL VASCULAR INTERVENTION Right 04/27/2017   Procedure: PERIPHERAL VASCULAR INTERVENTION;  Surgeon: Lorretta Harp, MD;  Location: [place] [facility];  Service: Cardiovascular;  Laterality:  Right;  Iliac    FAMHx:  Family History  Problem Relation Age of Onset  . Cancer Mother        small cell lung cancer  . Cancer Father        unsure type  . Colon cancer Neg Hx   . Rectal cancer Neg Hx   . Heart disease Neg Hx   . Pancreatic cancer Neg Hx   . Stomach cancer Neg Hx   . Esophageal cancer Neg Hx     SOCHx:   reports that he has been smoking cigarettes. He started smoking about 51 years ago. He has a 37.50 pack-year smoking history. He has never used smokeless tobacco. He reports current alcohol use of about 2.0 standard drinks of alcohol per week. He reports that he does not use drugs.  ALLERGIES:  Allergies  Allergen Reactions  . Cymbalta [Duloxetine Hcl]   . Effexor [Venlafaxine]     Feels bad  . Wellbutrin [Bupropion]     sleepy  . Zoloft [Sertraline Hcl]     Felt bad    MEDS:  Current Meds  Medication Sig  . ALPRAZolam (XANAX) 1 MG tablet Take 1/2-1 tablet 2 - 3 x /day ONLY if needed for Anxiety Attack &  limit to 5 days /week to avoid addiction (Patient taking differently: Take 1/2-1 tablet 2 - 3 x /day ONLY if needed for Anxiety Attack &  limit to 5 days /week to avoid addiction)  . aspirin EC 81 MG tablet Take 81 mg by mouth daily. Swallow whole.  Marland Kitchen atorvastatin (LIPITOR) 80 MG tablet Take 1 tablet Daily for Cholesterol  . Budeson-Glycopyrrol-Formoterol (BREZTRI AEROSPHERE) 160-9-4.8 MCG/ACT AERO Inhale 1 puff into the lungs in the morning and at bedtime.  . Cholecalciferol 250 MCG (10000 UT) CAPS Take 10,000 Units by mouth daily.   Marland Kitchen dofetilide (TIKOSYN) 500 MCG capsule Take 1 capsule (500 mcg total) by mouth 2 (two) times daily.  Marland Kitchen doxycycline (VIBRAMYCIN) 100 MG capsule Take 1 capsule 2 x /day with meals for Infection  . isoniazid (NYDRAZID) 300 MG tablet Take 1 tablet (300 mg total) by mouth daily.  Marland Kitchen ketoconazole (NIZORAL) 2 % cream Apply to rash 1 to 2 x /day  . neomycin-polymyxin b-dexamethasone (MAXITROL) 3.5-10000-0.1 SUSP Use  1 to 2 drops   to affected eye  4 x /day  . pyridOXINE (VITAMIN B-6) 100 MG tablet Take 100 mg by mouth daily.  . rivaroxaban (XARELTO) 20 MG TABS tablet Take 1 tablet Daily to Prevent Blood Clots  . triamcinolone cream (KENALOG) 0.5 % Apply 1 application topically 2 (two) times daily. (Patient taking differently: Apply 1 application topically daily as needed (rash).)     ROS: Pertinent items noted in HPI and remainder of comprehensive ROS otherwise negative.  Labs/Other Tests and Data Reviewed:    Recent Labs: 04/30/2020: Magnesium 2.1; TSH 1.97 06/12/2020: ALT 51; BUN 14; Creat 0.98; Hemoglobin 14.4; Platelets 162; Potassium 4.7; Sodium 141   Recent Lipid Panel Lab Results  Component Value Date/Time  CHOL 200 (H) 04/30/2020 02:05 PM   CHOL 132 08/10/2017 03:45 PM   TRIG 149 04/30/2020 02:05 PM   HDL 43 04/30/2020 02:05 PM   HDL 44 08/10/2017 03:45 PM   CHOLHDL 4.7 04/30/2020 02:05 PM   LDLCALC 131 (H) 04/30/2020 02:05 PM    Wt Readings from Last 3 Encounters:  09/28/20 168 lb 12.8 oz (76.6 kg)  09/19/20 171 lb 3.2 oz (77.7 kg)  09/04/20 173 lb (78.5 kg)     Exam:    Vital Signs:  There were no vitals taken for this visit.   General appearance: alert, no distress and appears stated age Lungs: no visual respiratory difficult Abdomen: thin Extremities: extremities normal, atraumatic, no cyanosis or edema Skin: Skin color, texture, turgor normal. No rashes or lesions Neurologic: Grossly normal Psych: Pleasant  ASSESSMENT & PLAN:    1. Mixed dyslipidemia, goal LDL <70 2. PAD  Mr. Badal has a mixed dyslipidemia but was above target LDL based on last labs in November.  He reports compliance with atorvastatin but had very good control over his lipids for many years and all of a sudden had doubling of his LDL last fall.  He cannot figure out why there was an increase.  He reports he was compliant with the medicine.  No significant dietary changes.  He has had a number of new medications  and some issues with bleeding but none of which are noted to be significant to interact with his atorvastatin.  We will reach out to his pharmacy to see if he has been compliant with filling his prescriptions.  I would also recommend repeating his labs at this point.  If he remains above target, we could again pursue Repatha which was contemplated in the past but never pursued for prior authorization.  COVID-19 Education: The signs and symptoms of COVID-19 were discussed with the patient and how to seek care for testing (follow up with PCP or arrange E-visit).  The importance of social distancing was discussed today.  Patient Risk:   After full review of this patients clinical status, I feel that they are at least moderate risk at this time.  Time:   Today, I have spent 25 minutes with the patient with telehealth technology discussing dyslipidemia, PAD, goal LDL.     Medication Adjustments/Labs and Tests Ordered: Current medicines are reviewed at length with the patient today.  Concerns regarding medicines are outlined above.   Tests Ordered: No orders of the defined types were placed in this encounter.   Medication Changes: No orders of the defined types were placed in this encounter.   Disposition:  in 4 month(s)  Pixie Casino, MD, [facility], [place] Director of the Advanced Lipid Disorders &  Cardiovascular Risk Reduction Clinic Diplomate of the American Board of Clinical Lipidology Attending Cardiologist  Direct Dial: [phone]  Fax: [phone]  Website:  [email or web address]  Pixie Casino, MD  10/10/2020 9:45 AM

## 2020-10-10 NOTE — Patient Instructions (Signed)
Medication Instructions:  Your physician recommends that you continue on your current medications as directed. Please refer to the Current Medication list given to you today.  *If you need a refill on your cardiac medications before your next appointment, please call your pharmacy*   Lab Work: FASTING lipid panel as soon as possible  FASTING lipid panel in 3-4 months (before next visit) to check cholesterol   If you have labs (blood work) drawn today and your tests are completely normal, you will receive your results only by: Marland Kitchen MyChart Message (if you have MyChart) OR . A paper copy in the mail If you have any lab test that is abnormal or we need to change your treatment, we will call you to review the results.   Testing/Procedures: NONE   Follow-Up: At [facility], you and your health needs are our priority.  As part of our continuing mission to provide you with exceptional heart care, we have created designated Provider Care Teams.  These Care Teams include your primary Cardiologist (physician) and Advanced Practice Providers (APPs -  Physician Assistants and Nurse Practitioners) who all work together to provide you with the care you need, when you need it.  We recommend signing up for the patient portal called "MyChart".  Sign up information is provided on this After Visit Summary.  MyChart is used to connect with patients for Virtual Visits (Telemedicine).  Patients are able to view lab/test results, encounter notes, upcoming appointments, etc.  Non-urgent messages can be sent to your provider as well.   To learn more about what you can do with MyChart, go to [email or web address].    Your next appointment:   4 month(s) - lipid clinic  The format for your next appointment:   In Person or Virtual  [facility] [place]   Provider:   K. Mali Hilty, MD   Other Instructions

## 2020-10-11 LAB — URINALYSIS, ROUTINE W REFLEX MICROSCOPIC
Bacteria, UA: NONE SEEN /HPF
Bilirubin Urine: NEGATIVE
Glucose, UA: NEGATIVE
Hyaline Cast: NONE SEEN /LPF
Ketones, ur: NEGATIVE
Leukocytes,Ua: NEGATIVE
Nitrite: NEGATIVE
Protein, ur: NEGATIVE
Specific Gravity, Urine: 1.013 (ref 1.001–1.03)
Squamous Epithelial / HPF: NONE SEEN /HPF (ref <=5)
WBC, UA: NONE SEEN /HPF (ref 0–5)
pH: 6.5 (ref 5.0–8.0)

## 2020-10-11 LAB — CBC WITH DIFFERENTIAL/PLATELET
Absolute Monocytes: 795 cells/uL (ref 200–950)
Basophils Absolute: 90 cells/uL (ref 0–200)
Basophils Relative: 1.1 %
Eosinophils Absolute: 344 cells/uL (ref 15–500)
Eosinophils Relative: 4.2 %
HCT: 41 % (ref 38.5–50.0)
Hemoglobin: 13.5 g/dL (ref 13.2–17.1)
Lymphs Abs: 2222 cells/uL (ref 850–3900)
MCH: 30.2 pg (ref 27.0–33.0)
MCHC: 32.9 g/dL (ref 32.0–36.0)
MCV: 91.7 fL (ref 80.0–100.0)
MPV: 11.4 fL (ref 7.5–12.5)
Monocytes Relative: 9.7 %
Neutro Abs: 4748 cells/uL (ref 1500–7800)
Neutrophils Relative %: 57.9 %
Platelets: 161 10*3/uL (ref 140–400)
RBC: 4.47 10*6/uL (ref 4.20–5.80)
RDW: 13.2 % (ref 11.0–15.0)
Total Lymphocyte: 27.1 %
WBC: 8.2 10*3/uL (ref 3.8–10.8)

## 2020-10-11 LAB — COMPLETE METABOLIC PANEL WITH GFR
AG Ratio: 1.7 (calc) (ref 1.0–2.5)
ALT: 23 U/L (ref 9–46)
AST: 33 U/L (ref 10–35)
Albumin: 4 g/dL (ref 3.6–5.1)
Alkaline phosphatase (APISO): 125 U/L (ref 35–144)
BUN: 15 mg/dL (ref 7–25)
CO2: 28 mmol/L (ref 20–32)
Calcium: 9.2 mg/dL (ref 8.6–10.3)
Chloride: 103 mmol/L (ref 98–110)
Creat: 0.9 mg/dL (ref 0.70–1.25)
GFR, Est African American: 101 mL/min/{1.73_m2} (ref >=60)
GFR, Est Non African American: 87 mL/min/{1.73_m2} (ref >=60)
Globulin: 2.4 g/dL (calc) (ref 1.9–3.7)
Glucose, Bld: 105 mg/dL — ABNORMAL HIGH (ref 65–99)
Potassium: 4.6 mmol/L (ref 3.5–5.3)
Sodium: 139 mmol/L (ref 135–146)
Total Bilirubin: 0.6 mg/dL (ref 0.2–1.2)
Total Protein: 6.4 g/dL (ref 6.1–8.1)

## 2020-10-11 LAB — URINE CULTURE
MICRO NUMBER:: 11767107
Result:: NO GROWTH
SPECIMEN QUALITY:: ADEQUATE

## 2020-10-11 LAB — LIPID PANEL
Cholesterol: 128 mg/dL (ref <200)
HDL: 44 mg/dL (ref >=40)
LDL Cholesterol (Calc): 57 mg/dL (calc)
Non-HDL Cholesterol (Calc): 84 mg/dL (calc) (ref <130)
Total CHOL/HDL Ratio: 2.9 (calc) (ref <5.0)
Triglycerides: 197 mg/dL — ABNORMAL HIGH (ref <150)

## 2020-10-11 LAB — MICROSCOPIC MESSAGE

## 2020-10-11 LAB — PSA: PSA: 1.14 ng/mL (ref <=4.0)

## 2020-10-11 NOTE — Progress Notes (Signed)
============================================================ -   Test results slightly outside the reference range are not unusual. If there is anything important, I will review this with you,  otherwise it is considered normal test values.  If you have further questions,  please do not hesitate to contact me at the office or via My Chart.  ============================================================ ============================================================  -  U/A  appears better - - - -  > > > culture pending   - PSA - Normal  & OK   - All Else - CBC - Kidneys - Electrolytes - Liver   - all  Normal / OK ============================================================ ============================================================

## 2020-10-12 NOTE — Progress Notes (Signed)
============================================================ ============================================================  -    Urine culture - OK - N0 infection                                        - but U/A still shows a trace of blood in urine   - So - Suggest keep your app't with Urologist. ============================================================ ============================================================

## 2020-10-17 DIAGNOSIS — F17210 Nicotine dependence, cigarettes, uncomplicated: Secondary | ICD-10-CM | POA: Diagnosis not present

## 2020-10-17 DIAGNOSIS — Z7901 Long term (current) use of anticoagulants: Secondary | ICD-10-CM | POA: Diagnosis not present

## 2020-10-17 DIAGNOSIS — J342 Deviated nasal septum: Secondary | ICD-10-CM | POA: Diagnosis not present

## 2020-10-17 DIAGNOSIS — R040 Epistaxis: Secondary | ICD-10-CM | POA: Diagnosis not present

## 2020-10-17 DIAGNOSIS — J3489 Other specified disorders of nose and nasal sinuses: Secondary | ICD-10-CM | POA: Diagnosis not present

## 2020-10-22 ENCOUNTER — Telehealth: Payer: Self-pay | Admitting: Student

## 2020-10-24 NOTE — Telephone Encounter (Signed)
*  STAT* If patient is at the pharmacy, call can be transferred to refill team.   1. Which medications need to be refilled? (please list name of each medication and dose if known)   dofetilide (TIKOSYN) 500 MCG capsule [[id number]]   2. Which pharmacy/location (including street and city if local pharmacy) is medication to be sent to? [facility] at [address]   3. Do they need a 30 day or 90 day supply? 90  Pt only has 2 pills left

## 2020-10-25 NOTE — Telephone Encounter (Signed)
Pt called in and stated they [facility] did not rec this med yesterday.  I called [facility] and spoke to tech.  He stated and confirmed that they did not rec it yesterday.  He would like to know if it can be resent.  Best number    [phone]

## 2020-10-26 ENCOUNTER — Other Ambulatory Visit (HOSPITAL_COMMUNITY): Payer: Self-pay | Admitting: *Deleted

## 2020-10-26 ENCOUNTER — Telehealth: Payer: Self-pay | Admitting: Student

## 2020-10-26 NOTE — Telephone Encounter (Signed)
Pt's medication was already sent to pt's pharmacy as requested. Confirmation received.  

## 2020-10-26 NOTE — Telephone Encounter (Signed)
New message     *STAT* If patient is at the pharmacy, call can be transferred to refill team.   1. Which medications need to be refilled? (please list name of each medication and dose if known)  tikosyn 500 mg  2. Which pharmacy/location (including street and city if local pharmacy) is medication to be sent to? [facility] on [address]  3. Do they need a 30 day or 90 day supply?  Pt states that as of yesterday, [facility] still did not have presc.  The pharmacy dept will open at 10am, please resend the presc.  Patient is worried that he will not get the refill and will be without medication

## 2020-11-01 DIAGNOSIS — J3489 Other specified disorders of nose and nasal sinuses: Secondary | ICD-10-CM | POA: Diagnosis not present

## 2020-11-01 DIAGNOSIS — Z7982 Long term (current) use of aspirin: Secondary | ICD-10-CM | POA: Diagnosis not present

## 2020-11-01 DIAGNOSIS — Z7901 Long term (current) use of anticoagulants: Secondary | ICD-10-CM | POA: Diagnosis not present

## 2020-11-01 DIAGNOSIS — J343 Hypertrophy of nasal turbinates: Secondary | ICD-10-CM | POA: Diagnosis not present

## 2020-11-01 DIAGNOSIS — F17210 Nicotine dependence, cigarettes, uncomplicated: Secondary | ICD-10-CM | POA: Diagnosis not present

## 2020-11-01 DIAGNOSIS — J342 Deviated nasal septum: Secondary | ICD-10-CM | POA: Diagnosis not present

## 2020-11-01 DIAGNOSIS — R040 Epistaxis: Secondary | ICD-10-CM | POA: Diagnosis not present

## 2020-11-07 DIAGNOSIS — R310 Gross hematuria: Secondary | ICD-10-CM | POA: Diagnosis not present

## 2020-11-11 NOTE — Progress Notes (Signed)
Future Appointments  Date Time Provider [place]  11/12/2020 10:30 AM [name], MD [facility] None  12/07/2020 10:30 AM Constance Haw, MD [facility]  05/08/2021  2:00 PM [name], MD [facility] None  06/12/2021 11:00 AM Liane Comber, NP [facility] None    History of Present Illness:       This very nice 70 y.o. [name]  presents for 6  month follow up with HTN, HLD, Pre-Diabetes and Vitamin D Deficiency.  Patient is followed by Dr Cy Blamer for limited systemic sclerosis.   Patient is followed by Dr Vaughan Browner for fibrotic Interstitial Lung Disease (probable UIP). Chest CT scan on 01/19/2020  Also showed Aortic Atherosclerosis.       Patient is treated for HTN (1996) & BP has been controlled at home. Today's BP is at goal - 110/62. In 1996, patient underwent an Ablation for pAfib by Dr Caryl Comes.  Then in 2018, he had PCA  & Arthrotomy of a Rt Common Iliac stenosis w/Stent implanted by Dr [name].  In 2019, he was hospitalized w/Afib (CHADsVASc 4) and started on Tykosyn &Xarelto.    Patient has had no complaints of any cardiac type chest pain, palpitations, dyspnea / orthopnea / PND, dizziness, claudication, or dependent edema.       Hyperlipidemia is controlled with diet & Atorvastatin. Patient denies myalgias or other med SE's. Last Lipids were  Lab Results  Component Value Date   CHOL 128 10/10/2020   HDL 44 10/10/2020   LDLCALC 57 10/10/2020   TRIG 197 (H) 10/10/2020   CHOLHDL 2.9 10/10/2020    Also, the patient has history of PreDiabetes (A1c 5.9% /2017) and has had no symptoms of reactive hypoglycemia, diabetic polys, paresthesias or visual blurring.  Last A1c was near goal:  Lab Results  Component Value Date   HGBA1C 5.7 (H) 04/30/2020           Further, the patient also has history of Vitamin D Deficiency ("28" /2017) and supplements vitamin D without any suspected side-effects. Last vitamin D was still low:  Lab Results   Component Value Date   VD25OH 76 04/30/2020    Current Outpatient Medications on File Prior to Visit  Medication Sig  . ALPRAZolam  1 MG tablet  Take 1/2-1 tablet 2 - 3 x /day ONLY if needed   . aspirin EC 81 MG tablet Take daily  . atorvastatin  80 MG tablet Take 1 tablet Daily for Cholesterol  . BREZTRI AEROSPHERE 160-9-4.8  Inhale 1 puff i in the morning and at bedtime.  . Vitamin D  10,000 u Take 10,000 Units by mouth daily.   Marland Kitchen TIKOSYN 500 MCG capsule TAKE ONE CAPSULE TWICE DAILY  . doxycycline  100 MG capsule Take 1 capsule 2 x /day with meals for Infection  . Isoniazid  300 MG tablet Take 1 tablet  daily.  Marland Kitchen NIZORAL  2 % cream Apply to rash 1 to 2 x /day  . MAXITROL SUSP Use  1 to 2 drops  to affected eye  4 x /day  . VITAMIN B-6 100 MG tablet Take  daily.  Alveda Reasons 20 MG  Take 1 tablet Daily to Prevent Blood Clots  . triamcinolone cream 0.5 % Apply 1 application topically daily as needed (rash).)    Allergies  Allergen Reactions  . Cymbalta [Duloxetine Hcl]   . Effexor [Venlafaxine]     Feels bad  . Wellbutrin [Bupropion]     sleepy  .  Zoloft [Sertraline Hcl]     Felt bad    PMHx:   Past Medical History:  Diagnosis Date  . Anxiety   . Atrial fibrillation ([place])   . Depression   . PVD (peripheral vascular disease) ([place])   . Raynaud's syndrome   . Systemic sclerosis ([place])   . TB lung, latent 12/19/2019  . Transaminitis 06/18/2020    Immunization History  Administered Date(s) Administered  . Influenza Whole 04/30/2010  . Influenza, High Dose Seasonal PF 03/20/2017, 03/29/2018, 04/06/2019, 04/30/2020  . Moderna Sars-Covid-2 Vaccination 05/12/2020  . PFIZER(Purple Top)SARS-COV-2 Vaccination 08/04/2019, 08/29/2019  . Pneumococcal Conjugate-13 02/05/2017  . Pneumococcal Polysaccharide-23 04/30/2010, 03/29/2018  . Td 06/30/2013    Past Surgical History:  Procedure Laterality Date  . ATRIAL FIBRILLATION ABLATION    . CATARACT EXTRACTION Right   .  COLONOSCOPY    . EYE SURGERY    . FRACTURE SURGERY Left 1976  . INGUINAL HERNIA REPAIR Right 03/23/2020   Procedure: OPEN RIGHT INGUINAL HERNIA REPAIR WITH MESH;  Surgeon: Johnathan Hausen, MD;  Location: [facility];  Service: General;  Laterality: Right;  . INSERTION OF MESH Right 03/23/2020   Procedure: INSERTION OF MESH;  Surgeon: Johnathan Hausen, MD;  Location: [facility];  Service: General;  Laterality: Right;  . LOWER EXTREMITY INTERVENTION N/A 04/27/2017   Procedure: LOWER EXTREMITY INTERVENTION;  Surgeon: Lorretta Harp, MD;  Location: [place] [facility];  Service: Cardiovascular;  Laterality: N/A;  . PERIPHERAL VASCULAR ATHERECTOMY Right 04/27/2017   Procedure: PERIPHERAL VASCULAR ATHERECTOMY;  Surgeon: Lorretta Harp, MD;  Location: [facility];  Service: Cardiovascular;  Laterality: Right;  Iliac  . PERIPHERAL VASCULAR INTERVENTION Right 04/27/2017   Procedure: PERIPHERAL VASCULAR INTERVENTION;  Surgeon: Lorretta Harp, MD;  Location: [place] [facility];  Service: Cardiovascular;  Laterality: Right;  Iliac    FHx:    Reviewed / unchanged  SHx:    Reviewed / unchanged   Systems Review:  Constitutional: Denies fever, chills, wt changes, headaches, insomnia, fatigue, night sweats, change in appetite. Eyes: Denies redness, blurred vision, diplopia, discharge, itchy, watery eyes.  ENT: Denies discharge, congestion, post nasal drip, epistaxis, sore throat, earache, hearing loss, dental pain, tinnitus, vertigo, sinus pain, snoring.  CV: Denies chest pain, palpitations, irregular heartbeat, syncope, dyspnea, diaphoresis, orthopnea, PND, claudication or edema. Respiratory: denies cough, dyspnea, DOE, pleurisy, hoarseness, laryngitis, wheezing.  Gastrointestinal: Denies dysphagia, odynophagia, heartburn, reflux, water brash, abdominal pain or cramps, nausea, vomiting, bloating, diarrhea, constipation, hematemesis, melena, hematochezia  or hemorrhoids. Genitourinary: Denies dysuria,  frequency, urgency, nocturia, hesitancy, discharge, hematuria or flank pain. Musculoskeletal: Denies arthralgias, myalgias, stiffness, jt. swelling, pain, limping or strain/sprain.  Skin: Denies pruritus, rash, hives, warts, acne, eczema or change in skin lesion(s). Neuro: No weakness, tremor, incoordination, spasms, paresthesia or pain. Psychiatric: Denies confusion, memory loss or sensory loss. Endo: Denies change in weight, skin or hair change.  Heme/Lymph: No excessive bleeding, bruising or enlarged lymph nodes.  Physical Exam  BP 110/62   Pulse 74   Temp (!) 97.3 F (36.3 C)   Resp 12   Ht 6\' 3"  (1.905 m)   Wt 173 lb (78.5 kg)   SpO2 98%   BMI 21.62 kg/m   Appears  well nourished, well groomed  and in no distress.  Eyes: PERRLA, EOMs, conjunctiva no swelling or erythema. Sinuses: No frontal/maxillary tenderness ENT/Mouth: EAC's clear, TM's nl w/o erythema, bulging. Nares clear w/o erythema, swelling, exudates. Oropharynx clear without erythema or exudates. Oral hygiene is  good. Tongue normal, non obstructing. Hearing intact.  Neck: Supple. Thyroid not palpable. Car 2+/2+ without bruits, nodes or JVD. Chest: Respirations nl with BS clear & equal w/o rales, rhonchi, wheezing or stridor.  Cor: Heart sounds normal w/ regular rate and rhythm without sig. murmurs, gallops, clicks or rubs. Peripheral pulses normal and equal  without edema.  Abdomen: Soft & bowel sounds normal. Non-tender w/o guarding, rebound, hernias, masses or organomegaly.  Lymphatics: Unremarkable.  Musculoskeletal: Full ROM all peripheral extremities, joint stability, 5/5 strength and normal gait.  Skin: Warm, dry without exposed rashes, lesions or ecchymosis apparent.  Neuro: Cranial nerves intact, reflexes equal bilaterally. Sensory-motor testing grossly intact. Tendon reflexes grossly intact.  Pysch: Alert & oriented x 3.  Insight and judgement nl & appropriate. No ideations.  Assessment and Plan:  1.  Essential hypertension  - Continue medication, monitor blood pressure at home.  - Continue DASH diet.  Reminder to go to the ER if any CP,  SOB, nausea, dizziness, severe HA, changes vision/speech.  - CBC with Differential/Platelet - COMPLETE METABOLIC PANEL WITH GFR - Magnesium - TSH  2. Hyperlipidemia, mixed  - Continue diet/meds, exercise,& lifestyle modifications.  - Continue monitor periodic cholesterol/liver & renal functions   - Lipid panel - TSH  3. Abnormal glucose  - Continue diet, exercise  - Lifestyle modifications.  - Monitor appropriate labs  - Hemoglobin A1c - Insulin, random  4. Vitamin D deficiency  - Continue supplementation.  - VITAMIN D 25 Hydroxy  5. Paroxysmal atrial fibrillation (HCC)  - TSH  6. Atherosclerosis of aorta ([place]) by Chest CT scan on 01/19/2020  - Lipid panel  7. Medication management  - CBC with Differential/Platelet - COMPLETE METABOLIC PANEL WITH GFR - Magnesium - Lipid panel - TSH - Hemoglobin A1c - Insulin, random - VITAMIN D 25 Hydroxy         Discussed  regular exercise, BP monitoring, weight control to achieve/maintain BMI less than 25 and discussed med and SE's. Recommended labs to assess and monitor clinical status with further disposition pending results of labs.  I discussed the assessment and treatment plan with the patient. The patient was provided an opportunity to ask questions and all were answered. The patient agreed with the plan and demonstrated an understanding of the instructions.  I provided over 30 minutes of exam, counseling, chart review and  complex critical decision making.         The patient was advised to call back or seek an in-person evaluation if the symptoms worsen or if the condition fails to improve as anticipated.   Kirtland Bouchard, MD

## 2020-11-12 ENCOUNTER — Encounter: Payer: Self-pay | Admitting: Internal Medicine

## 2020-11-12 ENCOUNTER — Ambulatory Visit (INDEPENDENT_AMBULATORY_CARE_PROVIDER_SITE_OTHER): Payer: PPO | Admitting: Internal Medicine

## 2020-11-12 ENCOUNTER — Other Ambulatory Visit: Payer: Self-pay

## 2020-11-12 VITALS — BP 110/62 | HR 74 | Temp 97.3°F | Resp 12 | Ht 75.0 in | Wt 173.0 lb

## 2020-11-12 DIAGNOSIS — I10 Essential (primary) hypertension: Secondary | ICD-10-CM | POA: Diagnosis not present

## 2020-11-12 DIAGNOSIS — Z79899 Other long term (current) drug therapy: Secondary | ICD-10-CM | POA: Diagnosis not present

## 2020-11-12 DIAGNOSIS — E559 Vitamin D deficiency, unspecified: Secondary | ICD-10-CM | POA: Diagnosis not present

## 2020-11-12 DIAGNOSIS — E782 Mixed hyperlipidemia: Secondary | ICD-10-CM | POA: Diagnosis not present

## 2020-11-12 DIAGNOSIS — I700 Atherosclerosis of aorta: Secondary | ICD-10-CM

## 2020-11-12 DIAGNOSIS — R7309 Other abnormal glucose: Secondary | ICD-10-CM | POA: Diagnosis not present

## 2020-11-12 DIAGNOSIS — I480 Paroxysmal atrial fibrillation: Secondary | ICD-10-CM

## 2020-11-12 NOTE — Patient Instructions (Signed)

## 2020-11-12 NOTE — Addendum Note (Signed)
Addended by: [name] on: 11/12/2020 03:54 PM   Modules accepted: Orders

## 2020-11-13 LAB — COMPLETE METABOLIC PANEL WITH GFR
AG Ratio: 1.6 (calc) (ref 1.0–2.5)
ALT: 16 U/L (ref 9–46)
AST: 24 U/L (ref 10–35)
Albumin: 4.1 g/dL (ref 3.6–5.1)
Alkaline phosphatase (APISO): 74 U/L (ref 35–144)
BUN: 16 mg/dL (ref 7–25)
CO2: 28 mmol/L (ref 20–32)
Calcium: 9.3 mg/dL (ref 8.6–10.3)
Chloride: 105 mmol/L (ref 98–110)
Creat: 1.07 mg/dL (ref 0.70–1.25)
GFR, Est African American: 82 mL/min/{1.73_m2} (ref >=60)
GFR, Est Non African American: 70 mL/min/{1.73_m2} (ref >=60)
Globulin: 2.5 g/dL (calc) (ref 1.9–3.7)
Glucose, Bld: 131 mg/dL — ABNORMAL HIGH (ref 65–99)
Potassium: 4.6 mmol/L (ref 3.5–5.3)
Sodium: 142 mmol/L (ref 135–146)
Total Bilirubin: 0.5 mg/dL (ref 0.2–1.2)
Total Protein: 6.6 g/dL (ref 6.1–8.1)

## 2020-11-13 LAB — CBC WITH DIFFERENTIAL/PLATELET
Absolute Monocytes: 466 cells/uL (ref 200–950)
Basophils Absolute: 69 cells/uL (ref 0–200)
Basophils Relative: 1.1 %
Eosinophils Absolute: 271 cells/uL (ref 15–500)
Eosinophils Relative: 4.3 %
HCT: 40.2 % (ref 38.5–50.0)
Hemoglobin: 13 g/dL — ABNORMAL LOW (ref 13.2–17.1)
Lymphs Abs: 1562 cells/uL (ref 850–3900)
MCH: 30.2 pg (ref 27.0–33.0)
MCHC: 32.3 g/dL (ref 32.0–36.0)
MCV: 93.5 fL (ref 80.0–100.0)
MPV: 11 fL (ref 7.5–12.5)
Monocytes Relative: 7.4 %
Neutro Abs: 3931 cells/uL (ref 1500–7800)
Neutrophils Relative %: 62.4 %
Platelets: 143 10*3/uL (ref 140–400)
RBC: 4.3 10*6/uL (ref 4.20–5.80)
RDW: 13.4 % (ref 11.0–15.0)
Total Lymphocyte: 24.8 %
WBC: 6.3 10*3/uL (ref 3.8–10.8)

## 2020-11-13 LAB — VITAMIN D 25 HYDROXY (VIT D DEFICIENCY, FRACTURES): Vit D, 25-Hydroxy: 50 ng/mL (ref 30–100)

## 2020-11-13 LAB — INSULIN, RANDOM: Insulin: 33.8 u[IU]/mL — ABNORMAL HIGH

## 2020-11-13 LAB — MAGNESIUM: Magnesium: 2 mg/dL (ref 1.5–2.5)

## 2020-11-13 LAB — TSH: TSH: 1.47 mIU/L (ref 0.40–4.50)

## 2020-11-13 NOTE — Progress Notes (Signed)
============================================================ -   Test results slightly outside the reference range are not unusual. If there is anything important, I will review this with you,  otherwise it is considered normal test values.  If you have further questions,  please do not hesitate to contact me at the office or via My Chart.  ============================================================ ============================================================  -  Vitamin D = 50  - is "OK' - is a little low                  (Ideal or goal is between 70-100)   -  Be sure taking 10,000 units   / day   All Else - CBC - Kidneys - Electrolytes - Liver - Magnesium & Thyroid    - all  Normal / OK ============================================================ ============================================================

## 2020-11-16 DIAGNOSIS — R310 Gross hematuria: Secondary | ICD-10-CM | POA: Diagnosis not present

## 2020-11-22 DIAGNOSIS — F17210 Nicotine dependence, cigarettes, uncomplicated: Secondary | ICD-10-CM | POA: Diagnosis not present

## 2020-11-22 DIAGNOSIS — Z7901 Long term (current) use of anticoagulants: Secondary | ICD-10-CM | POA: Diagnosis not present

## 2020-11-22 DIAGNOSIS — J342 Deviated nasal septum: Secondary | ICD-10-CM | POA: Diagnosis not present

## 2020-11-22 DIAGNOSIS — R040 Epistaxis: Secondary | ICD-10-CM | POA: Diagnosis not present

## 2020-11-22 DIAGNOSIS — J343 Hypertrophy of nasal turbinates: Secondary | ICD-10-CM | POA: Diagnosis not present

## 2020-12-06 ENCOUNTER — Telehealth: Payer: Self-pay | Admitting: Cardiology

## 2020-12-06 NOTE — Telephone Encounter (Signed)
Spoke with patient and let him know the visit tomorrow is a 6 month office visit follow up with the doctor and I advised him to keep this appointment.  Patient stated he would keep the appointment.

## 2020-12-06 NOTE — Telephone Encounter (Signed)
Patietn called to see what tomorrow visit is about rather he actually need or not. He wants to know if this is for a cholesterol shot. Please advise

## 2020-12-07 ENCOUNTER — Encounter: Payer: Self-pay | Admitting: Cardiology

## 2020-12-07 ENCOUNTER — Other Ambulatory Visit: Payer: Self-pay

## 2020-12-07 ENCOUNTER — Ambulatory Visit: Payer: PPO | Admitting: Cardiology

## 2020-12-07 VITALS — BP 124/60 | HR 81 | Ht 75.0 in | Wt 168.8 lb

## 2020-12-07 DIAGNOSIS — I4819 Other persistent atrial fibrillation: Secondary | ICD-10-CM

## 2020-12-07 NOTE — Progress Notes (Signed)
Electrophysiology Office Note   Date:  12/07/2020   ID:  Bradley Ferguson, DOB [birth year], MRN [id number]  PCP:  [name], MD  Cardiologist:  [name] Primary Electrophysiologist:  Constance Haw, MD    No chief complaint on file.    History of Present Illness: Bradley Ferguson is a 70 y.o. male who is being seen today for the evaluation of atrial fibrillation at the request of [name], MD. Presenting today for electrophysiology evaluation.    He has a history of peripheral arterial disease, hyperlipidemia, 45-year tobacco history, COPD.  He had an ablation in 2011 which she feels was due to atrial fibrillation.  He presented to his primary physician who found him to be in atrial fibrillation.  He is now status post admission for dofetilide.  Today, denies symptoms of palpitations, chest pain, shortness of breath, orthopnea, PND, lower extremity edema, claudication, dizziness, presyncope, syncope, or neurologic sequela. The patient is tolerating medications without difficulties.  He has been having nosebleeds.  He attributes this to his Xarelto.  He was initially taking Xarelto on an every other day basis, but was told that it is a once a day medicine.  He has had silver nitrate therapy to his nose which is improved his nosebleeding.   Past Medical History:  Diagnosis Date   Anxiety    Atrial fibrillation ([place])    Depression    PVD (peripheral vascular disease) ([place])    Raynaud's syndrome    Systemic sclerosis ([place])    TB lung, latent 12/19/2019   Transaminitis 06/18/2020   Past Surgical History:  Procedure Laterality Date   ATRIAL FIBRILLATION ABLATION     CATARACT EXTRACTION Right    COLONOSCOPY     EYE SURGERY     FRACTURE SURGERY Left 1976   INGUINAL HERNIA REPAIR Right 03/23/2020   Procedure: OPEN RIGHT INGUINAL HERNIA REPAIR WITH MESH;  Surgeon: Johnathan Hausen, MD;  Location: [facility];  Service: General;  Laterality: Right;   INSERTION OF MESH Right  03/23/2020   Procedure: INSERTION OF MESH;  Surgeon: Johnathan Hausen, MD;  Location: [facility];  Service: General;  Laterality: Right;   LOWER EXTREMITY INTERVENTION N/A 04/27/2017   Procedure: LOWER EXTREMITY INTERVENTION;  Surgeon: Lorretta Harp, MD;  Location: [facility];  Service: Cardiovascular;  Laterality: N/A;   PERIPHERAL VASCULAR ATHERECTOMY Right 04/27/2017   Procedure: PERIPHERAL VASCULAR ATHERECTOMY;  Surgeon: Lorretta Harp, MD;  Location: [facility];  Service: Cardiovascular;  Laterality: Right;  Iliac   PERIPHERAL VASCULAR INTERVENTION Right 04/27/2017   Procedure: PERIPHERAL VASCULAR INTERVENTION;  Surgeon: Lorretta Harp, MD;  Location: [facility];  Service: Cardiovascular;  Laterality: Right;  Iliac     Current Outpatient Medications  Medication Sig Dispense Refill   ALPRAZolam (XANAX) 1 MG tablet Take 1/2-1 tablet 2 - 3 x /day ONLY if needed for Anxiety Attack &  limit to 5 days /week to avoid addiction (Patient taking differently: Take 1/2-1 tablet 2 - 3 x /day ONLY if needed for Anxiety Attack &  limit to 5 days /week to avoid addiction) 90 tablet 0   aspirin EC 81 MG tablet Take 81 mg by mouth daily. Swallow whole.     atorvastatin (LIPITOR) 80 MG tablet Take 1 tablet Daily for Cholesterol 90 tablet 3   Cholecalciferol 250 MCG (10000 UT) CAPS Take 10,000 Units by mouth daily.      dofetilide (TIKOSYN) 500 MCG capsule TAKE ONE CAPSULE  BY MOUTH TWICE DAILY 180 capsule 2   ketoconazole (NIZORAL) 2 % cream Apply to rash 1 to 2 x /day 60 g 1   pyridOXINE (VITAMIN B-6) 100 MG tablet Take 100 mg by mouth daily.     rivaroxaban (XARELTO) 20 MG TABS tablet Take 1 tablet Daily to Prevent Blood Clots 90 tablet 3   triamcinolone cream (KENALOG) 0.5 % Apply 1 application topically 2 (two) times daily. 80 g 1   No current facility-administered medications for this visit.    Allergies:   Cymbalta [duloxetine hcl], Effexor [venlafaxine], Wellbutrin  [bupropion], and Zoloft [sertraline hcl]   Social History:  The patient  reports that he has been smoking cigarettes. He started smoking about 51 years ago. He has a 37.50 pack-year smoking history. He has never used smokeless tobacco. He reports current alcohol use of about 2.0 standard drinks of alcohol per week. He reports that he does not use drugs.   Family History:  The patient's family history includes Cancer in his father and mother.   ROS:  Please see the history of present illness.   Otherwise, review of systems is positive for none.   All other systems are reviewed and negative.   PHYSICAL EXAM: VS:  BP 124/60   Pulse 81   Ht 6\' 3"  (1.905 m)   Wt 168 lb 12.8 oz (76.6 kg)   SpO2 97%   BMI 21.10 kg/m  , BMI Body mass index is 21.1 kg/m. GEN: Well nourished, well developed, in no acute distress  HEENT: normal  Neck: no JVD, carotid bruits, or masses Cardiac: RRR; no murmurs, rubs, or gallops,no edema  Respiratory:  clear to auscultation bilaterally, normal work of breathing GI: soft, nontender, nondistended, + BS MS: no deformity or atrophy  Skin: warm and dry Neuro:  Strength and sensation are intact Psych: euthymic mood, full affect  EKG:  EKG is ordered today. Personal review of the ekg ordered shows sinus rhythm, rate 81, right bundle branch block  Recent Labs: 11/12/2020: ALT 16; BUN 16; Creat 1.07; Hemoglobin 13.0; Magnesium 2.0; Platelets 143; Potassium 4.6; Sodium 142; TSH 1.47    Lipid Panel     Component Value Date/Time   CHOL 128 10/10/2020 1509   CHOL 132 08/10/2017 1545   TRIG 197 (H) 10/10/2020 1509   HDL 44 10/10/2020 1509   HDL 44 08/10/2017 1545   CHOLHDL 2.9 10/10/2020 1509   VLDL 35 (H) 02/05/2017 1234   LDLCALC 57 10/10/2020 1509     Wt Readings from Last 3 Encounters:  12/07/20 168 lb 12.8 oz (76.6 kg)  11/12/20 173 lb (78.5 kg)  09/28/20 168 lb 12.8 oz (76.6 kg)      Other studies Reviewed: Additional studies/ records that were  reviewed today include: TTE 03/08/20  1. Left ventricular ejection fraction, by estimation, is 65 to 70%. The  left ventricle has hyperdynamic function. The left ventricle has no  regional wall motion abnormalities. There is mild left ventricular  hypertrophy. Left ventricular diastolic  parameters are consistent with Grade II diastolic dysfunction  (pseudonormalization).   2. Right ventricular systolic function is normal. The right ventricular  size is normal. There is normal pulmonary artery systolic pressure. The  estimated right ventricular systolic pressure is 09.3 mmHg.   3. Left atrial size was mildly dilated.   4. Chiari network noted. Right atrial size was mildly dilated.   5. The mitral valve is normal in structure. No evidence of mitral valve  regurgitation. No  evidence of mitral stenosis. Moderate mitral annular  calcification.   6. The aortic valve is tricuspid. Aortic valve regurgitation is not  visualized. Mild aortic valve sclerosis is present, with no evidence of  aortic valve stenosis.   7. The inferior vena cava is normal in size with greater than 50%  respiratory variability, suggesting right atrial pressure of 3 mmHg.   ASSESSMENT AND PLAN:  . 1.  Persistent atrial fibrillation: Currently on Xarelto and dofetilide.  High risk medication monitoring.  QTC is not overly prolonged on ECG.  CHA2DS2-VASc of 4.  He fortunately remains in sinus rhythm.  We Bradley Ferguson continue with current management.  He has been having nosebleeds on Xarelto.  He has had silver nitrate treatment in the past.  I did discuss watchman with him to be able to stop Xarelto, though he is not quite ready for this.  He Bradley Ferguson call us back if he changes his mind.  2.  Hyperlipidemia: Continue atorvastatin per primary cardiology  3.  Hypertension: Currently well controlled  4.  Peripheral arterial disease: Status post angiography in 2018 by Dr. Alvester Chou.  Work-up by primary cardiology.    Current medicines are  reviewed at length with the patient today.   The patient does not have concerns regarding his medicines.  The following changes were made today: None  Labs/ tests ordered today include:  Orders Placed This Encounter  Procedures   EKG 12-Lead     Disposition:   FU with Philamena Kramar 6 months  Signed, Arryanna Holquin Meredith Leeds, MD  12/07/2020 10:51 AM     [address] [place] [address] [phone] (office) [phone] (fax)

## 2020-12-07 NOTE — Patient Instructions (Signed)
Medication Instructions:  °Your physician recommends that you continue on your current medications as directed. Please refer to the Current Medication list given to you today. ° °*If you need a refill on your cardiac medications before your next appointment, please call your pharmacy* ° ° °Lab Work: °None ordered ° ° °Testing/Procedures: °None ordered ° ° °Follow-Up: °At [facility], you and your health needs are our priority.  As part of our continuing mission to provide you with exceptional heart care, we have created designated Provider Care Teams.  These Care Teams include your primary Cardiologist (physician) and Advanced Practice Providers (APPs -  Physician Assistants and Nurse Practitioners) who all work together to provide you with the care you need, when you need it. ° °Your next appointment:   °6 month(s) ° °The format for your next appointment:   °In Person ° °Provider:   °Will Camnitz, MD ° ° ° °Thank you for choosing [facility]!! ° ° °Jamieson Hetland, RN °[phone] °  °

## 2020-12-17 DIAGNOSIS — R310 Gross hematuria: Secondary | ICD-10-CM | POA: Diagnosis not present

## 2020-12-20 ENCOUNTER — Other Ambulatory Visit (HOSPITAL_COMMUNITY): Payer: Self-pay | Admitting: Cardiovascular Disease

## 2020-12-20 ENCOUNTER — Ambulatory Visit (HOSPITAL_COMMUNITY)
Admission: RE | Admit: 2020-12-20 | Discharge: 2020-12-20 | Disposition: A | Discharge status: Home / Self Care | Payer: PPO | Source: Ambulatory Visit | Attending: Cardiovascular Disease | Admitting: Cardiovascular Disease

## 2020-12-20 ENCOUNTER — Ambulatory Visit (HOSPITAL_BASED_OUTPATIENT_CLINIC_OR_DEPARTMENT_OTHER)
Admission: RE | Admit: 2020-12-20 | Discharge: 2020-12-20 | Disposition: A | Discharge status: Home / Self Care | Payer: PPO | Source: Ambulatory Visit | Attending: Cardiovascular Disease | Admitting: Cardiovascular Disease

## 2020-12-20 ENCOUNTER — Other Ambulatory Visit: Payer: Self-pay

## 2020-12-20 DIAGNOSIS — I739 Peripheral vascular disease, unspecified: Secondary | ICD-10-CM

## 2020-12-20 DIAGNOSIS — Z95828 Presence of other vascular implants and grafts: Secondary | ICD-10-CM | POA: Diagnosis not present

## 2020-12-21 ENCOUNTER — Other Ambulatory Visit: Payer: Self-pay

## 2020-12-25 ENCOUNTER — Other Ambulatory Visit: Payer: Self-pay

## 2020-12-25 DIAGNOSIS — I739 Peripheral vascular disease, unspecified: Secondary | ICD-10-CM

## 2020-12-25 DIAGNOSIS — Z95828 Presence of other vascular implants and grafts: Secondary | ICD-10-CM

## 2020-12-25 NOTE — Progress Notes (Signed)
vas 

## 2021-04-17 DIAGNOSIS — H40013 Open angle with borderline findings, low risk, bilateral: Secondary | ICD-10-CM | POA: Diagnosis not present

## 2021-04-17 DIAGNOSIS — Z961 Presence of intraocular lens: Secondary | ICD-10-CM | POA: Diagnosis not present

## 2021-04-26 ENCOUNTER — Other Ambulatory Visit: Payer: Self-pay | Admitting: *Deleted

## 2021-04-26 MED ORDER — DOFETILIDE 500 MCG PO CAPS: 500.0000 ug | ORAL_CAPSULE | Freq: Two times a day (BID) | ORAL | 2 refills | Status: DC

## 2021-05-06 NOTE — Progress Notes (Signed)
MEDICARE ANNUAL WELLNESS VISIT AND FOLLOW UP Assessment:    Bradley Ferguson was seen today for follow-up and medicare wellness.  Diagnoses and all orders for this visit:  Encounter for Medicare annual wellness exam Due Annually Health Maintenance Reviewed Healthy lifestyle reviewed and goals set  Labile hypertension Continue medication Monitor blood pressure at home; call if consistently over 130/80 Continue DASH diet.   Reminder to go to the ER if any CP, SOB, nausea, dizziness, severe HA, changes vision/speech, left arm numbness and tingling and jaw pain.  Paroxysmal atrial fibrillation (HCC) Rate controlled today, doing well on tikosyn, xarelto  Has followed up with cardiology for tikosyn levels Followed by Dr. [name]   Raynaud's disease without gangrene Improved recently, continue to monitor   BPH Recently well controlled, denies symptoms  Vitamin D deficiency Continue supplementation- taking 10000 IU daily  Check vitamin D level -     VITAMIN D 25 Hydroxy (Vit-D Deficiency, Fractures)  TOBACCO ABUSE Discussed risks associated with tobacco use and advised to reduce or quit Patient is ready to do so and plans to slow taper  Declines Chantix or other medication, has nicotine patches Will follow up at the next visit Continue annual low dose CT lung screening; ? pulm may complete; currently UTD   COPD/emphysema ([place]) Stop smoking, pulm following; given breztri samples, he will follow up with pulm, consider advair/neb meds if cost prohibitive   ILD ([place]) Following pulm/rheum; pending imuran due to TB and elevated LFTs  Prediabetes Discussed disease and risks Discussed diet/exercise, weight management  Reminded to schedule appointment with ophthalmology -     Hemoglobin A1c  Hyperlipidemia, mixed Continue medications: on atorvastatin, pending repatha Continue low cholesterol diet and exercise.  Check lipid panel.  -     Lipid panel -     TSH  Claudication in peripheral  vascular disease (HCC) Followed by Dr. [name], s/p stent, continued claudication, medical treatment, - pending pletal and repatha Recommended to quit smoking, regular exercise  Attention deficit disorder, unspecified hyperactivity presence Currently stable, doing well at current job off of medications  ANXIETY DEPRESSION Well managed by current regimen; continue medications- using benzo sparingly Stress management techniques discussed, increase water, good sleep hygiene discussed, increase exercise, and increase veggies.   BMI 26.0-26.9,adult Long discussion about weight loss, diet, and exercise Recommended diet heavy in fruits and veggies and low in animal meats, cheeses, and dairy products, appropriate calorie intake Discussed appropriate weight for height Follow up at next visit  Medication management -     CBC with Differential/Platelet -     COMPLETE METABOLIC PANEL WITH GFR -     Magnesium  Colon polyps Recommended 6 month follow up colonoscopy due to numerous polyps, due 12/2018, referred back to Dr. Henrene Pastor   R foot corn Not resolving despite numeous attempts with cryo, resection, topical salicylic acid therapy Pain limiting activity levels Treatment now complicated by PAD;  Discussed and will refer to podiatry for further treatment recommendations due to QOL and risks  Elevated LFTs Benign hx, new, recheck today, negative recent hepatitis, no imaging Check GGT, Iron panel, LFTs If persistently elevated pursue abd [place] ? R/t isoniazid - if not trending up further, monitor and recheck after completion of treatment    Over 30 minutes of exam, counseling, chart review, and critical decision making was performed  Future Appointments  Date Time Provider [place]  05/20/2021 11:30 AM Newton Pigg, [facility] None  11/07/2021 11:00 AM Magda Bernheim, NP [facility] None  Plan:   During the course of the visit the patient was educated and counseled about  appropriate screening and preventive services including:   Pneumococcal vaccine  Influenza vaccine Prevnar 13 Td vaccine Screening electrocardiogram Colorectal cancer screening Diabetes screening Glaucoma screening Nutrition counseling    Subjective:  Bradley Ferguson is a 70 y.o. male who presents for Medicare Annual Wellness Visit and 3 month follow up for HTN, hyperlipidemia, prediabetes, and vitamin D Def.   Today he is concerned about painful wart/corn to R lateral foot, painful with weight bearing, persistent/recurrent despite cryo/resection, salicylic acid treatments, interested in podiatry referral today.   He does have hx of mixed bipolar type depression with ADD and anxiety though in recently years has been apparently stable on xanax, recently taking rarely, averages 1/2 tab 2-3 times a week. He reports doing well with current regimen, trying to taper off.   Patient was recently dx'd by Rheumatology (Dr Estanislado Pandy) with limited systemic sclerosis with intentions to start Imuran when he has completed 2 months of INH for a positive QuantiFERON test, however holding on due to elevated LFTs, continues with isoniazid.  High-resolution Chest CT in Jan found basilar fibrotic interstitial lung disease with probable UIP & patient has also been evaluated by Pulmonary by Dr Vaughan Browner.   he currently continues to smoke 0.75 pack a day, 50 pack year history; discussed risks associated with smoking, patient is not ready to quit, but slowly tapering down. He had CT lung 01/19/2020, due to have repeat now.  Colonoscopy 08/01/20 several more polyps removed, next colonoscopy due 2025  BMI is Body mass index is 22.32 kg/m., he has been working on diet, exercise limited by cardiovascular disease (PAD/claudication).  Wt Readings from Last 3 Encounters:  05/08/21 178 lb 9.6 oz (81 kg)  12/07/20 168 lb 12.8 oz (76.6 kg)  11/12/20 173 lb (78.5 kg)   Hx/o pAfib s/p ablation in 2011 and was seen in 2019 for  CPE and was back in Afib and started on Xarelto, and was admitted for initiation of Tikosyn with successful conversion in Oct 2019. He is followed by Dr. Rayann Heman and also Dr. [name] for PVD with claudification, peripheral angiography on  04/27/17 revealing a 99% calcified eccentric proximal right common iliac artery stenosis and bilateral long segment SFA chronic total occlusions which were highly calcified. Pt underwent direct back orbital rotational atherectomy, PTCA and cover stenting, however with persistent/recurrent sx, managed medically.   His blood pressure has been controlled at home, today their BP is BP: 134/78 He does not workout. He denies chest pain, shortness of breath, dizziness.   He continues to smoke 3/4 ppd. Chest CT scan on 01/19/20  Showed Aortic Atherosclerosis and chronic Interstitial ung disease- 12 month follow up is recommended.  He is on cholesterol medication (atorvastatin 80 mg daily) and denies myalgias. His cholesterol is not at goal. He was previously evaluated by lipid clinic for consideration of repatha, pending insurance approval at lipid clinic.The cholesterol last visit was:   Lab Results  Component Value Date   CHOL 128 10/10/2020   HDL 44 10/10/2020   LDLCALC 57 10/10/2020   TRIG 197 (H) 10/10/2020   CHOLHDL 2.9 10/10/2020   He has not been working on diet and exercise for prediabetes, and denies increased appetite, nausea, polydipsia, polyuria, visual disturbances, vomiting and weight loss. Last A1C in the office was:  Lab Results  Component Value Date   HGBA1C 5.7 (H) 04/30/2020   Last GFR Lab Results  Component Value Date   GFRNONAA 17 11/12/2020   Patient is on Vitamin D supplement, taking 10000 IU daily    Lab Results  Component Value Date   VD25OH 5 11/12/2020     Does question his memory, trouble remembering names.  Believes it is getting worse.  MMSE 29/30 today.       Medication Review:   Current Outpatient Medications  (Cardiovascular):    atorvastatin (LIPITOR) 80 MG tablet, Take 1 tablet Daily for Cholesterol   dofetilide (TIKOSYN) 500 MCG capsule, Take 1 capsule (500 mcg total) by mouth 2 (two) times daily.   Current Outpatient Medications (Analgesics):    aspirin EC 81 MG tablet, Take 81 mg by mouth daily. Swallow whole. (Patient not taking: Reported on 05/08/2021)  Current Outpatient Medications (Hematological):    rivaroxaban (XARELTO) 20 MG TABS tablet, Take 1 tablet Daily to Prevent Blood Clots  Current Outpatient Medications (Other):    ALPRAZolam (XANAX) 1 MG tablet, Take 1/2-1 tablet 2 - 3 x /day ONLY if needed for Anxiety Attack &  limit to 5 days /week to avoid addiction (Patient taking differently: Take 1/2-1 tablet 2 - 3 x /day ONLY if needed for Anxiety Attack &  limit to 5 days /week to avoid addiction)   Cholecalciferol 250 MCG (10000 UT) CAPS, Take 10,000 Units by mouth daily.    pyridOXINE (VITAMIN B-6) 100 MG tablet, Take 100 mg by mouth daily.   triamcinolone cream (KENALOG) 0.5 %, Apply 1 application topically 2 (two) times daily.   ketoconazole (NIZORAL) 2 % cream, Apply to rash 1 to 2 x /day (Patient not taking: Reported on 05/08/2021)  Allergies: Allergies  Allergen Reactions   Cymbalta [Duloxetine Hcl]    Effexor [Venlafaxine]     Feels bad   Wellbutrin [Bupropion]     sleepy   Zoloft [Sertraline Hcl]     Felt bad    Current Problems (verified) has ANXIETY DEPRESSION; TOBACCO ABUSE; RAYNAUDS SYNDROME; Attention deficit disorder; Dental caries; Bipolar I disorder ([place]); Atrial fibrillation ([place]); Hyperlipidemia, mixed; Vitamin D deficiency; Labile hypertension; BPH; Claudication in peripheral vascular disease ([place]); Visit for monitoring Tikosyn therapy; Colon polyps; Acquired thrombophilia ([place]); Atherosclerosis of aorta ([place]) by Chest CT scan on 01/19/2020; Chronic obstructive pulmonary disease ([place]); Centrilobular emphysema ([place]); Interstitial pulmonary disease ([place]);  Positive ANA (antinuclear antibody); TB lung, latent; Systemic scleroses ([place]); Right inguinal hernia; Essential hypertension; Corn of foot; Elevated LFTs; and Hepatic steatosis on their problem list.  Screening Tests Immunization History  Administered Date(s) Administered   Influenza Whole 04/30/2010   Influenza, High Dose Seasonal PF 03/20/2017, 03/29/2018, 04/06/2019, 04/30/2020, 05/08/2021   Moderna Sars-Covid-2 Vaccination 05/12/2020   PFIZER(Purple Top)SARS-COV-2 Vaccination 08/04/2019, 08/29/2019   Pneumococcal Conjugate-13 02/05/2017   Pneumococcal Polysaccharide-23 04/30/2010, 03/29/2018   Td 06/30/2013    Preventative care: Last colonoscopy: 07/2020, repeat 3 years CXR: 2011, getting every 2 years through work CT lung 12/2019 due, ordered today  Prior vaccinations: TD or Tdap: 2015  Influenza: 05/08/21  Pneumococcal: 2019 Prevnar13: 2018 Shingles/Zostavax: declines  Covid 19: 3/3, 2021  Names of Other Physician/Practitioners you currently use: 1. [facility] here for primary care 2. Dr. Katy Fitch, eye doctor, last visit 03/2021, monitoring L cataract 3. Full dentures   Patient Care Team: [name], MD as PCP - General (Internal Medicine) Constance Haw, MD as PCP - Electrophysiology (Cardiology) Lorretta Harp, MD as PCP - Cardiology (Cardiology) Lorretta Harp, MD as Consulting Physician (Cardiology) Irene Shipper,  MD as Consulting Physician (Gastroenterology) Warden Fillers, MD as Consulting Physician (Ophthalmology) Newton Pigg, [facility] as Pharmacist (Pharmacist)  Surgical: He  has a past surgical history that includes Atrial fibrillation ablation; LOWER EXTREMITY INTERVENTION (N/A, 04/27/2017); PERIPHERAL VASCULAR INTERVENTION (Right, 04/27/2017); PERIPHERAL VASCULAR ATHERECTOMY (Right, 04/27/2017); Cataract extraction (Right); Eye surgery; Colonoscopy; Fracture surgery (Left, 1976); Inguinal hernia repair  (Right, 03/23/2020); and Insertion of mesh (Right, 03/23/2020). Family His family history includes Cancer in his father and mother. Social history  He reports that he has been smoking cigarettes. He started smoking about 51 years ago. He has a 37.50 pack-year smoking history. He has never used smokeless tobacco. He reports current alcohol use of about 2.0 standard drinks per week. He reports that he does not use drugs.  MEDICARE WELLNESS OBJECTIVES: Physical activity: Current Exercise Habits: The patient does not participate in regular exercise at present, Exercise limited by: orthopedic condition(s);respiratory conditions(s);cardiac condition(s) Cardiac risk factors: Cardiac Risk Factors include: advanced age (>27men, >47 women);dyslipidemia;hypertension;male gender;smoking/ tobacco exposure;sedentary lifestyle Depression/mood screen:   Depression screen [facility] 2/9 05/08/2021  Decreased Interest 1  Down, Depressed, Hopeless 1  PHQ - 2 Score 2  Altered sleeping 0  Tired, decreased energy 1  Change in appetite 0  Feeling bad or failure about yourself  2  Trouble concentrating 0  Moving slowly or fidgety/restless 0  Suicidal thoughts 0  PHQ-9 Score 5  Difficult doing work/chores Not difficult at all    ADLs:  In your present state of health, do you have any difficulty performing the following activities: 05/08/2021 06/12/2020  Hearing? N N  Vision? N N  Difficulty concentrating or making decisions? N N  Walking or climbing stairs? N N  Dressing or bathing? N N  Doing errands, shopping? N N  Some recent data might be hidden     Cognitive Testing  Alert? Yes  Normal Appearance?Yes  Oriented to person? Yes  Place? Yes   Time? Yes  Recall of three objects?  Yes  Can perform simple calculations? Yes  Displays appropriate judgment?Yes  Can read the correct time from a watch face?Yes  EOL planning: Does Patient Have a Medical Advance Directive?: No Would patient like information on  creating a medical advance directive?: No - Patient declined   Objective:   Today's Vitals   05/08/21 1446  BP: 134/78  Pulse: 85  Resp: 16  Temp: 97.7 F (36.5 C)  SpO2: 98%  Weight: 178 lb 9.6 oz (81 kg)  Height: 6\' 3"  (1.905 m)    Body mass index is 22.32 kg/m.  General appearance: alert, no distress, WD/WN, male HEENT: normocephalic, sclerae anicteric, TMs pearly, nares patent, no discharge or erythema, pharynx normal Oral cavity: MMM, no lesions Neck: supple, no lymphadenopathy, no thyromegaly, no masses Heart: RRR, normal S1, S2, no murmurs Lungs: CTA bilaterally, mildly coarse over bronchioles anteriorly, no wheezes, rhonchi, or rales Abdomen: +bs, soft, non tender, non distended, no masses, no hepatomegaly, no splenomegaly Musculoskeletal: nontender, no swelling, no obvious deformity Extremities: no edema, no cyanosis, no clubbing. R foot with corn to 1st digit base and laterally  Pulses: 2+ symmetric, upper extreamities, LE eripheral pulses thready and without edema. No aortic or femoral bruits. Neurological: alert, oriented x 3, CN2-12 intact, strength normal upper extremities and lower extremities, sensation normal throughout, DTRs 2+ throughout, no cerebellar signs, gait normal Psychiatric: normal affect, behavior normal, pleasant   Medicare Attestation I have personally reviewed: The patient's medical and social history Their use of alcohol, tobacco or  illicit drugs Their current medications and supplements The patient's functional ability including ADLs,fall risks, home safety risks, cognitive, and hearing and visual impairment Diet and physical activities Evidence for depression or mood disorders  The patient's weight, height, BMI, and visual acuity have been recorded in the chart.  I have made referrals, counseling, and provided education to the patient based on review of the above and I have provided the patient with a written personalized care plan for  preventive services.     Magda Bernheim, NP   05/08/2021

## 2021-05-08 ENCOUNTER — Encounter: Payer: Self-pay | Admitting: Nurse Practitioner

## 2021-05-08 ENCOUNTER — Other Ambulatory Visit: Payer: Self-pay

## 2021-05-08 ENCOUNTER — Telehealth: Payer: Self-pay | Admitting: Internal Medicine

## 2021-05-08 ENCOUNTER — Ambulatory Visit (INDEPENDENT_AMBULATORY_CARE_PROVIDER_SITE_OTHER): Payer: PPO | Admitting: Nurse Practitioner

## 2021-05-08 VITALS — BP 134/78 | HR 85 | Temp 97.7°F | Resp 16 | Ht 75.0 in | Wt 178.6 lb

## 2021-05-08 DIAGNOSIS — F341 Dysthymic disorder: Secondary | ICD-10-CM | POA: Diagnosis not present

## 2021-05-08 DIAGNOSIS — J849 Interstitial pulmonary disease, unspecified: Secondary | ICD-10-CM | POA: Diagnosis not present

## 2021-05-08 DIAGNOSIS — J449 Chronic obstructive pulmonary disease, unspecified: Secondary | ICD-10-CM

## 2021-05-08 DIAGNOSIS — F17200 Nicotine dependence, unspecified, uncomplicated: Secondary | ICD-10-CM

## 2021-05-08 DIAGNOSIS — Z23 Encounter for immunization: Secondary | ICD-10-CM | POA: Diagnosis not present

## 2021-05-08 DIAGNOSIS — I480 Paroxysmal atrial fibrillation: Secondary | ICD-10-CM | POA: Diagnosis not present

## 2021-05-08 DIAGNOSIS — I739 Peripheral vascular disease, unspecified: Secondary | ICD-10-CM | POA: Diagnosis not present

## 2021-05-08 DIAGNOSIS — E782 Mixed hyperlipidemia: Secondary | ICD-10-CM | POA: Diagnosis not present

## 2021-05-08 DIAGNOSIS — E559 Vitamin D deficiency, unspecified: Secondary | ICD-10-CM

## 2021-05-08 DIAGNOSIS — J432 Centrilobular emphysema: Secondary | ICD-10-CM

## 2021-05-08 DIAGNOSIS — R7309 Other abnormal glucose: Secondary | ICD-10-CM

## 2021-05-08 DIAGNOSIS — I10 Essential (primary) hypertension: Secondary | ICD-10-CM | POA: Diagnosis not present

## 2021-05-08 DIAGNOSIS — R7989 Other specified abnormal findings of blood chemistry: Secondary | ICD-10-CM | POA: Diagnosis not present

## 2021-05-08 DIAGNOSIS — Z0001 Encounter for general adult medical examination with abnormal findings: Secondary | ICD-10-CM

## 2021-05-08 DIAGNOSIS — Z79899 Other long term (current) drug therapy: Secondary | ICD-10-CM

## 2021-05-08 DIAGNOSIS — I700 Atherosclerosis of aorta: Secondary | ICD-10-CM

## 2021-05-08 DIAGNOSIS — I7300 Raynaud's syndrome without gangrene: Secondary | ICD-10-CM | POA: Diagnosis not present

## 2021-05-08 DIAGNOSIS — F988 Other specified behavioral and emotional disorders with onset usually occurring in childhood and adolescence: Secondary | ICD-10-CM

## 2021-05-08 DIAGNOSIS — Z0000 Encounter for general adult medical examination without abnormal findings: Secondary | ICD-10-CM

## 2021-05-08 DIAGNOSIS — D6869 Other thrombophilia: Secondary | ICD-10-CM | POA: Diagnosis not present

## 2021-05-08 DIAGNOSIS — R6889 Other general symptoms and signs: Secondary | ICD-10-CM | POA: Diagnosis not present

## 2021-05-08 DIAGNOSIS — D126 Benign neoplasm of colon, unspecified: Secondary | ICD-10-CM

## 2021-05-08 NOTE — Patient Instructions (Signed)

## 2021-05-08 NOTE — Chronic Care Management (AMB) (Signed)
  Chronic Care Management   Note  05/08/2021 Name: Bradley Ferguson MRN: [id number] DOB: [birth year]  Bradley Ferguson is a 70 y.o. year old male who is a primary care patient of [name], MD. I reached out to [name] by phone today in response to a referral sent by Mr. Sheliah Hatch PCP, [name], MD.   Mr. Sia was given information about Chronic Care Management services today including:  CCM service includes personalized support from designated clinical staff supervised by his physician, including individualized plan of care and coordination with other care providers 24/7 contact phone numbers for assistance for urgent and routine care needs. Service will only be billed when office clinical staff spend 20 minutes or more in a month to coordinate care. Only one practitioner may furnish and bill the service in a calendar month. The patient may stop CCM services at any time (effective at the end of the month) by phone call to the office staff.   Patient agreed to services and verbal consent obtained.   Follow up plan:   [place] Secretary/administrator

## 2021-05-09 LAB — COMPLETE METABOLIC PANEL WITH GFR
AG Ratio: 1.9 (calc) (ref 1.0–2.5)
ALT: 14 U/L (ref 9–46)
AST: 21 U/L (ref 10–35)
Albumin: 4.3 g/dL (ref 3.6–5.1)
Alkaline phosphatase (APISO): 68 U/L (ref 35–144)
BUN: 14 mg/dL (ref 7–25)
CO2: 25 mmol/L (ref 20–32)
Calcium: 9.3 mg/dL (ref 8.6–10.3)
Chloride: 106 mmol/L (ref 98–110)
Creat: 1.03 mg/dL (ref 0.70–1.35)
Globulin: 2.3 g/dL (calc) (ref 1.9–3.7)
Glucose, Bld: 82 mg/dL (ref 65–99)
Potassium: 4.7 mmol/L (ref 3.5–5.3)
Sodium: 141 mmol/L (ref 135–146)
Total Bilirubin: 0.8 mg/dL (ref 0.2–1.2)
Total Protein: 6.6 g/dL (ref 6.1–8.1)
eGFR: 79 mL/min/{1.73_m2} (ref >=60)

## 2021-05-09 LAB — HEMOGLOBIN A1C
Hgb A1c MFr Bld: 5.5 % of total Hgb (ref <5.7)
Mean Plasma Glucose: 111 mg/dL
eAG (mmol/L): 6.2 mmol/L

## 2021-05-09 LAB — CBC WITH DIFFERENTIAL/PLATELET
Absolute Monocytes: 648 cells/uL (ref 200–950)
Basophils Absolute: 58 cells/uL (ref 0–200)
Basophils Relative: 0.8 %
Eosinophils Absolute: 310 cells/uL (ref 15–500)
Eosinophils Relative: 4.3 %
HCT: 41 % (ref 38.5–50.0)
Hemoglobin: 13.8 g/dL (ref 13.2–17.1)
Lymphs Abs: 1994 cells/uL (ref 850–3900)
MCH: 31.5 pg (ref 27.0–33.0)
MCHC: 33.7 g/dL (ref 32.0–36.0)
MCV: 93.6 fL (ref 80.0–100.0)
MPV: 11.6 fL (ref 7.5–12.5)
Monocytes Relative: 9 %
Neutro Abs: 4190 cells/uL (ref 1500–7800)
Neutrophils Relative %: 58.2 %
Platelets: 153 10*3/uL (ref 140–400)
RBC: 4.38 10*6/uL (ref 4.20–5.80)
RDW: 13.1 % (ref 11.0–15.0)
Total Lymphocyte: 27.7 %
WBC: 7.2 10*3/uL (ref 3.8–10.8)

## 2021-05-09 LAB — TSH: TSH: 1.54 mIU/L (ref 0.40–4.50)

## 2021-05-09 LAB — LIPID PANEL
Cholesterol: 155 mg/dL (ref <200)
HDL: 39 mg/dL — ABNORMAL LOW (ref >=40)
LDL Cholesterol (Calc): 85 mg/dL (calc)
Non-HDL Cholesterol (Calc): 116 mg/dL (calc) (ref <130)
Total CHOL/HDL Ratio: 4 (calc) (ref <5.0)
Triglycerides: 211 mg/dL — ABNORMAL HIGH (ref <150)

## 2021-05-09 LAB — MICROALBUMIN / CREATININE URINE RATIO
Creatinine, Urine: 151 mg/dL (ref 20–320)
Microalb Creat Ratio: 7 mcg/mg creat (ref <30)
Microalb, Ur: 1.1 mg/dL

## 2021-05-20 ENCOUNTER — Ambulatory Visit: Payer: PPO | Admitting: Pharmacist

## 2021-05-20 ENCOUNTER — Other Ambulatory Visit: Payer: Self-pay

## 2021-05-20 ENCOUNTER — Other Ambulatory Visit: Payer: Self-pay | Admitting: Internal Medicine

## 2021-05-20 DIAGNOSIS — E782 Mixed hyperlipidemia: Secondary | ICD-10-CM

## 2021-05-20 DIAGNOSIS — F17200 Nicotine dependence, unspecified, uncomplicated: Secondary | ICD-10-CM

## 2021-05-20 DIAGNOSIS — I10 Essential (primary) hypertension: Secondary | ICD-10-CM

## 2021-05-20 DIAGNOSIS — J449 Chronic obstructive pulmonary disease, unspecified: Secondary | ICD-10-CM

## 2021-05-20 DIAGNOSIS — R0989 Other specified symptoms and signs involving the circulatory and respiratory systems: Secondary | ICD-10-CM

## 2021-05-20 DIAGNOSIS — F341 Dysthymic disorder: Secondary | ICD-10-CM

## 2021-05-20 MED ORDER — ALBUTEROL SULFATE HFA 108 (90 BASE) MCG/ACT IN AERS: INHALATION_SPRAY | RESPIRATORY_TRACT | 3 refills | Status: DC
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
## 2021-05-29 ENCOUNTER — Telehealth: Payer: Self-pay

## 2021-05-29 DIAGNOSIS — E782 Mixed hyperlipidemia: Secondary | ICD-10-CM | POA: Diagnosis not present

## 2021-05-29 DIAGNOSIS — J449 Chronic obstructive pulmonary disease, unspecified: Secondary | ICD-10-CM | POA: Diagnosis not present

## 2021-05-29 DIAGNOSIS — I10 Essential (primary) hypertension: Secondary | ICD-10-CM | POA: Diagnosis not present

## 2021-05-29 DIAGNOSIS — F341 Dysthymic disorder: Secondary | ICD-10-CM | POA: Diagnosis not present

## 2021-05-29 NOTE — Progress Notes (Signed)
Care plan printed and mailed to patient  Bradley Ferguson, Health Concierge 

## 2021-06-04 ENCOUNTER — Ambulatory Visit
Admission: RE | Admit: 2021-06-04 | Discharge: 2021-06-04 | Disposition: A | Discharge status: Home / Self Care | Payer: PPO | Source: Ambulatory Visit | Attending: Nurse Practitioner | Admitting: Nurse Practitioner

## 2021-06-04 ENCOUNTER — Other Ambulatory Visit: Payer: Self-pay

## 2021-06-04 DIAGNOSIS — F17200 Nicotine dependence, unspecified, uncomplicated: Secondary | ICD-10-CM

## 2021-06-04 DIAGNOSIS — J449 Chronic obstructive pulmonary disease, unspecified: Secondary | ICD-10-CM

## 2021-06-04 DIAGNOSIS — F17210 Nicotine dependence, cigarettes, uncomplicated: Secondary | ICD-10-CM | POA: Diagnosis not present

## 2021-06-04 DIAGNOSIS — J432 Centrilobular emphysema: Secondary | ICD-10-CM

## 2021-06-12 ENCOUNTER — Ambulatory Visit: Payer: PPO | Admitting: Adult Health

## 2021-08-05 ENCOUNTER — Telehealth: Payer: Self-pay

## 2021-08-05 NOTE — Telephone Encounter (Signed)
Called patient in regards to PAP for Breztri inhaler, Xarelto, and Tikosyn.Told patient to bring proof of income and proof of address to the office to start the process. Patient verbalized agreement. Told patient the pharmacists is only in office on Tuesdays, and he would need to come when she is there to sign the forms. Patient agreed. Gave patient my number for any questions/concerns.  Total time spent:

## 2021-08-12 ENCOUNTER — Telehealth: Payer: Self-pay

## 2021-08-12 NOTE — Telephone Encounter (Signed)
Called patient to complete PHQ-9. Pt. politely declined depression assessment for right now. Pt. stated "Now is not a good time." Pt. prefers me to reach out to him in next week. Will reach out to pt. next week to complete assessment. Pt. stated he has not found any counseling service and is not interested at this time d/t financial problems.  Total time spent: [facility], [facility]

## 2021-08-22 ENCOUNTER — Telehealth: Payer: Self-pay

## 2021-08-22 NOTE — Telephone Encounter (Signed)
LM-08/22/21-Reached out to patient again (2nd time) to do depression assessment, pt. politely declined at this time. Gave patient our patient assistance line number to pt. incase he has any concerns or would like to complete the assessment. Pt. verbalized understanding and agreed.   Total time spent: [place], [facility]

## 2021-08-27 DIAGNOSIS — E782 Mixed hyperlipidemia: Secondary | ICD-10-CM | POA: Diagnosis not present

## 2021-08-27 DIAGNOSIS — J449 Chronic obstructive pulmonary disease, unspecified: Secondary | ICD-10-CM | POA: Diagnosis not present

## 2021-08-27 DIAGNOSIS — I10 Essential (primary) hypertension: Secondary | ICD-10-CM | POA: Diagnosis not present

## 2021-08-27 DIAGNOSIS — F341 Dysthymic disorder: Secondary | ICD-10-CM | POA: Diagnosis not present

## 2021-10-24 ENCOUNTER — Encounter: Payer: Self-pay | Admitting: Cardiovascular Disease

## 2021-10-24 ENCOUNTER — Ambulatory Visit: Payer: PPO | Admitting: Cardiovascular Disease

## 2021-10-24 VITALS — BP 130/68 | HR 90 | Ht 75.0 in | Wt 175.0 lb

## 2021-10-24 DIAGNOSIS — I480 Paroxysmal atrial fibrillation: Secondary | ICD-10-CM | POA: Diagnosis not present

## 2021-10-24 DIAGNOSIS — I739 Peripheral vascular disease, unspecified: Secondary | ICD-10-CM | POA: Diagnosis not present

## 2021-10-24 DIAGNOSIS — F17200 Nicotine dependence, unspecified, uncomplicated: Secondary | ICD-10-CM

## 2021-10-24 DIAGNOSIS — R0989 Other specified symptoms and signs involving the circulatory and respiratory systems: Secondary | ICD-10-CM | POA: Diagnosis not present

## 2021-10-24 MED ORDER — DOFETILIDE 500 MCG PO CAPS: 500.0000 ug | ORAL_CAPSULE | Freq: Two times a day (BID) | ORAL | 2 refills | Status: DC

## 2021-10-24 NOTE — Assessment & Plan Note (Signed)
History of ongoing tobacco abuse of 1/2 pack/day recalcitrant to resector modification. ?

## 2021-10-24 NOTE — Assessment & Plan Note (Signed)
History of hyperlipidemia on high-dose statin therapy with lipid profile performed 05/08/2021 revealing total cholesterol 155, LDL of 85 and HDL 39. ?

## 2021-10-24 NOTE — Assessment & Plan Note (Signed)
History of labile hypertension a blood pressure measured today 130/68.  He is not on antihypertensive medications. ?

## 2021-10-24 NOTE — Assessment & Plan Note (Signed)
History of peripheral arterial disease status post peripheral angiography which I performed 04/27/2017 revealing a 99% calcified eccentric proximal right common iliac artery stenosis, 40% ostial left common iliac artery stenosis with with a 10 mm gradient and long bilateral calcified SFA occlusions which I do not think are percutaneously addressable.  I stented his right common iliac artery with a 7 mm x 39 mm long VBX covered stent.  He still has claudication.  His most recent Dopplers however performed 12/20/2020 revealed ABIs in the 0.6 range bilaterally with patent iliac stent on the right. ?

## 2021-10-24 NOTE — Progress Notes (Signed)
? ? ? ?10/24/2021 ?Bradley Ferguson   ?07-19-1950  ?[id number] ? ?Primary Physician [name], MD ?Primary Cardiologist: Lorretta Harp MD Bradley Ferguson, [place] ? ?HPI:  Bradley Ferguson is a 71 y.o.  separated, father of 53, grandfather one grandchild who works as a Systems developer. He was referred by Dr. Melford Aase for peripheral vascular evaluation because of life limiting claudication. I last saw him in the office 09/04/2020.Marland Kitchen His risk factors include treated hyperlipidemia and 45 pack years of tobacco abuse continued to smoke one pack per day. Never had a heart attack or stroke. He does complain of some dyspnea on exertion probably related to COPD. He's had A. fib ablation back in 2011 by Dr. Caryl Comes but has not followed up since. He has complained of less limiting claudication over the last year which is symmetric and bilateral. He can walk one block after which she has to stop because of discomfort. He has had lower extremity arterial Doppler studies in our office 04/11/17 revealing a high-frequency signal in his right common iliac artery and a total left SFA. ?  ?I performed peripheral angiography on him 04/27/17 revealing a 99% calcified eccentric proximal right common iliac artery stenosis and bilateral long segment SFA chronic total occlusions which were highly calcified. I performed diamondback orbital rotational atherectomy, PTCA and cover stenting using a 7 mm x 39 mm long VBX covered stent with an excellent angiographic result. The claudication had somewhat improved as has his Doppler studies. I do not think his SFAs are easily percutaneously addressable. ?  ?He continues to complain of some continued claudication.  He continues to smoke as well.  He also complains of some shortness of breath.    He had Dopplers performed 12/21/2019 revealing ABIs of 0.7 range bilaterally with a patent right iliac stent.  I believe he would benefit from the addition of Pletal.  We will explore this with our Pharm.D. to  determine if there are any drug drug interactions.  He is also not at goal on high-dose atorvastatin and may benefit from being on a PCSK9 inhibitor as well.   ?  ?Since I saw him a year ago he has remained stable.  He denies chest pain or shortness of breath.  He did have a Tikosyn load and has maintained sinus rhythm on Xarelto oral anticoagulation.  He had followed up with Dr. Curt Bears for this.  His claudication has remained stable.  He had Dopplers performed 12/20/2020 [name] revealing a patent right iliac stent without progression of disease on the left. ? ? ?Current Meds  ?Medication Sig  ? albuterol (VENTOLIN HFA) 108 (90 Base) MCG/ACT inhaler Use  2 inhalations  15 minutes apart  every 4 hours  to rescue Asthma Attack  ? ALPRAZolam (XANAX) 1 MG tablet Take 1/2-1 tablet 2 - 3 x /day ONLY if needed for Anxiety Attack &  limit to 5 days /week to avoid addiction (Patient taking differently: Take 1/2-1 tablet 2 - 3 x /day ONLY if needed for Anxiety Attack &  limit to 5 days /week to avoid addiction)  ? atorvastatin (LIPITOR) 80 MG tablet Take 1 tablet Daily for Cholesterol  ? Cholecalciferol 250 MCG (10000 UT) CAPS Take 10,000 Units by mouth daily.   ? pyridOXINE (VITAMIN B-6) 100 MG tablet Take 100 mg by mouth daily.  ? rivaroxaban (XARELTO) 20 MG TABS tablet Take 1 tablet Daily to Prevent Blood Clots  ? [DISCONTINUED] dofetilide (TIKOSYN) 500 MCG capsule Take 1 capsule (500  mcg total) by mouth 2 (two) times daily.  ?  ? ?Allergies  ?Allergen Reactions  ? Cymbalta [Duloxetine Hcl]   ? Effexor [Venlafaxine]   ?  Feels bad  ? Wellbutrin [Bupropion]   ?  sleepy  ? Zoloft [Sertraline Hcl]   ?  Felt bad  ? ? ?Social History  ? ?Socioeconomic History  ? Marital status: Divorced  ?  Spouse name: Not on file  ? Number of children: 1  ? Years of education: Not on file  ? Highest education level: Not on file  ?Occupational History  ? 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 Smoking status: Every Day  ?   Packs/day: 0.75  ?  Years: 50.00  ?  Pack years: 37.50  ?  Types: Cigarettes  ?  Start date: 64  ? Smokeless tobacco: Never  ?Vaping Use  ? Vaping Use: Never used  ?Substance and Sexual Activity  ? Alcohol use: Yes  ?  Alcohol/week: 2.0 standard drinks  ?  Types: 2 Standard drinks or equivalent per week  ?  Comment: 2 drinks per week   ? Drug use: Never  ? Sexual activity: Not on file  ?Other Topics Concern  ? Not on file  ?Social History Narrative  ? Not on file  ? ?Social Determinants of Health  ? ?Financial Resource Strain: Not on file  ?Food Insecurity: Food Insecurity Present  ? Worried About Charity fundraiser in the Last Year: Sometimes true  ? Ran Out of Food in the Last Year: Never true  ?Transportation Needs: Not on file  ?Physical Activity: Not on file  ?Stress: Not on file  ?Social Connections: Not on file  ?Intimate Partner Violence: Not on file  ?  ? ?Review of Systems: ?General: negative for chills, fever, night sweats or weight changes.  ?Cardiovascular: negative for chest pain, dyspnea on exertion, edema, orthopnea, palpitations, paroxysmal nocturnal dyspnea or shortness of breath ?Dermatological: negative for rash ?Respiratory: negative for cough or wheezing ?Urologic: negative for hematuria ?Abdominal: negative for nausea, vomiting, diarrhea, bright red blood per rectum, melena, or hematemesis ?Neurologic: negative for visual changes, syncope, or dizziness ?All other systems reviewed and are otherwise negative except as noted above. ? ? ? ?Blood pressure 130/68, pulse 90, height '6\' 3"'$  (1.905 m), weight 175 lb (79.4 kg), SpO2 98 %.  ?General appearance: alert and no distress ?Neck: no adenopathy, no carotid bruit, no JVD, supple, symmetrical, trachea midline, and thyroid not enlarged, symmetric, no tenderness/mass/nodules ?Lungs: clear to auscultation bilaterally ?Heart: regular rate and rhythm, S1, S2 normal, no murmur, click, rub or gallop ?Extremities: extremities normal, atraumatic, no  cyanosis or edema ?Pulses: Absent pedal pulses ?Skin: Skin color, texture, turgor normal. No rashes or lesions ?Neurologic: Grossly normal ? ?EKG sinus rhythm at 90 with right bundle branch block.  I personally reviewed this EKG. ? ?ASSESSMENT AND PLAN:  ? ?TOBACCO ABUSE ?History of ongoing tobacco abuse of 1/2 pack/day recalcitrant to resector modification. ? ?Atrial fibrillation ([place]) ?History of PAF status post A-fib ablation by Dr. Caryl Comes in 2011.  He did have a Tikosyn load and has followed up with Dr. Curt Bears last year.  He is on Xarelto oral anticoagulation and is maintaining sinus rhythm. ? ?Hyperlipidemia, mixed ?History of hyperlipidemia on high-dose statin therapy with lipid profile performed 05/08/2021 revealing total cholesterol 155, LDL of 85 and HDL 39. ? ?Labile hypertension ?History of labile hypertension a blood pressure measured today 130/68.  He is not on antihypertensive  medications. ? ?Claudication in peripheral vascular disease ([place]) ?History of peripheral arterial disease status post peripheral angiography which I performed 04/27/2017 revealing a 99% calcified eccentric proximal right common iliac artery stenosis, 40% ostial left common iliac artery stenosis with with a 10 mm gradient and long bilateral calcified SFA occlusions which I do not think are percutaneously addressable.  I stented his right common iliac artery with a 7 mm x 39 mm long VBX covered stent.  He still has claudication.  His most recent Dopplers however performed 12/20/2020 revealed ABIs in the 0.6 range bilaterally with patent iliac stent on the right. ? ? ? ? ?Lorretta Harp MD FACP,FACC,FAHA, FSCAI ?10/24/2021 ?10:34 AM ?

## 2021-10-24 NOTE — Patient Instructions (Signed)
Medication Instructions:  ?Your physician recommends that you continue on your current medications as directed. Please refer to the Current Medication list given to you today.  ?*If you need a refill on your cardiac medications before your next appointment, please call your pharmacy* ? ? ?Lab Work: ?None ?If you have labs (blood work) drawn today and your tests are completely normal, you will receive your results only by: ?MyChart Message (if you have MyChart) OR ?A paper copy in the mail ?If you have any lab test that is abnormal or we need to change your treatment, we will call you to review the results. ? ? ?Testing/Procedures: ?Your physician has requested that you have a lower extremity arterial duplex. This test is an ultrasound of the arteries in the legs . It looks at arterial blood flow in the legs . Allow one hour for Lower Arterial scans. There are no restrictions or special instructions ? ? ? ?Follow-Up: ?At [facility], you and your health needs are our priority.  As part of our continuing mission to provide you with exceptional heart care, we have created designated Provider Care Teams.  These Care Teams include your primary Cardiologist (physician) and Advanced Practice Providers (APPs -  Physician Assistants and Nurse Practitioners) who all work together to provide you with the care you need, when you need it. ? ?We recommend signing up for the patient portal called "MyChart".  Sign up information is provided on this After Visit Summary.  MyChart is used to connect with patients for Virtual Visits (Telemedicine).  Patients are able to view lab/test results, encounter notes, upcoming appointments, etc.  Non-urgent messages can be sent to your provider as well.   ?To learn more about what you can do with MyChart, go to [email or web address].   ? ?Your next appointment:   ?1 year(s) ? ?The format for your next appointment:   ?In Person ? ?Provider:   ?Quay Burow, MD   ? ? ?Other  Instructions ? ? ?Important Information About Sugar ? ? ? ? ?  ?

## 2021-10-24 NOTE — Assessment & Plan Note (Signed)
History of PAF status post A-fib ablation by Dr. Caryl Comes in 2011.  He did have a Tikosyn load and has followed up with Dr. Curt Bears last year.  He is on Xarelto oral anticoagulation and is maintaining sinus rhythm. ?

## 2021-11-06 NOTE — Progress Notes (Addendum)
MEDICARE ANNUAL WELLNESS VISIT AND FOLLOW UP ?Assessment:  ? ? ?Bradley Ferguson was seen today for follow-up and medicare wellness. ? ?Diagnoses and all orders for this visit: ? ?Encounter for Medicare annual wellness exam ?Due Annually ?Health Maintenance Reviewed ?Healthy lifestyle reviewed and goals set ? ?Labile hypertension ?Continue medication ?Monitor blood pressure at home; call if consistently over 130/80 ?Continue DASH diet.   ?Reminder to go to the ER if any CP, SOB, nausea, dizziness, severe HA, changes vision/speech, left arm numbness and tingling and jaw pain. ? ?Paroxysmal atrial fibrillation ([place]) ?Rate controlled today, doing well on tikosyn, xarelto  ?Has followed up with cardiology for tikosyn levels ?Followed by Dr. [name]  ? ?Systemic scleroses ([place]) ?Dr. Estanislado Pandy following; pending possible imuran ?He has extensive questions and concerns; discussed at length today, but encouraged him to follow up with Dr. Estanislado Pandy for more complete discussion of her recommendations, risks/benefits ? ?Raynaud's disease without gangrene ?Improved recently, continue to monitor  ? ?BPH ?Recently well controlled, denies symptoms ? ?Vitamin D deficiency ?Continue supplementation- taking 10000 IU daily  ?Check vitamin D level ?-     VITAMIN D 25 Hydroxy (Vit-D Deficiency, Fractures) ? ?TOBACCO ABUSE ?Discussed risks associated with tobacco use and advised to reduce or quit ?Patient is ready to do so and plans to slow taper  ?Declines Chantix or other medication, has nicotine patches ?Will follow up at the next visit ?Continue annual low dose CT lung screening; UTD last one 06/04/21 ? ?COPD/emphysema ([place]) ?Stop smoking, pulm following; given trelegy samples, he will follow up with pulm, consider advair/neb meds if cost prohibitive  ? ?ILD ([place]) ?Referred back to Dr. Vaughan Browner for reevaluation ?Continue inhalers ? ?Prediabetes ?Discussed disease and risks ?Discussed diet/exercise, weight management  ?Reminded to schedule  appointment with ophthalmology ?-     Hemoglobin A1c ? ?Hyperlipidemia, mixed ?Continue medications: on atorvastatin, pending repatha ?Continue low cholesterol diet and exercise.  ?Check lipid panel.  ?-     Lipid panel ?-     TSH ? ?Bipolar I disorder ([place]) ?Depression has increased but refuses medication ?Lifestyle discussed: diet/exerise, sleep hygiene, stress management, hydration ?Monitor ?Instructed patient to contact office or on-call physician promptly should condition worsen or any new symptoms appear. IF THE PATIENT HAS ANY SUICIDAL OR HOMICIDAL IDEATIONS, CALL THE OFFICE, DISCUSS WITH A SUPPORT MEMBER, OR GO TO THE ER IMMEDIATELY. Patient was agreeable with this plan.  ? ?Claudication in peripheral vascular disease ([place]) ?Followed by Dr. [name], s/p stent, continued claudication, medical treatment, - pending pletal and repatha ?Recommended to quit smoking, regular exercise ? ?Acquired Thrombophilia([facility]) ?Continue Xarelto daily ?Monitor for bruising and bleeding ? ?Attention deficit disorder, unspecified hyperactivity presence ?Currently stable, doing well at current job off of medications ? ?ANXIETY DEPRESSION ?Well managed by current regimen; continue medications- using benzo sparingly ?Stress management techniques discussed, increase water, good sleep hygiene discussed, increase exercise, and increase veggies.  ? ?BMI 22 ?Long discussion about weight loss, diet, and exercise ?Recommended diet heavy in fruits and veggies and low in animal meats, cheeses, and dairy products, appropriate calorie intake ?Discussed appropriate weight for height ?Follow up at next visit ? ?Medication management ?-     CBC with Differential/Platelet ?-     COMPLETE METABOLIC PANEL WITH GFR ?-     Magnesium ? ?Colon polyps ?Colonoscopy UTD last 08/01/20, due 2025 ? ? ?Elevated LFTs ?Last LFT's have been in normal range ?-CMP ? ?Medication Management ?Continued ? ? ?Over 30 minutes of exam, counseling, chart review, and  critical  decision making was performed ? ?Future Appointments  ?Date Time Provider [place]  ?12/10/2021 11:30 AM Bel[place], Osa Craver, RPH [facility] None  ?12/23/2021  1:00 PM [facility] VASC 3 [facility]  ?11/10/2022 11:00 AM Magda Bernheim, NP [facility] None  ? ? ? ?Plan:  ? ?During the course of the visit the patient was educated and counseled about appropriate screening and preventive services including:  ? ?Pneumococcal vaccine  ?Influenza vaccine ?Prevnar 13 ?Td vaccine ?Screening electrocardiogram ?Colorectal cancer screening ?Diabetes screening ?Glaucoma screening ?Nutrition counseling  ? ? ?Subjective:  ?Bradley Ferguson is a 71 y.o. male who presents for Medicare Annual Wellness Visit and 3 month follow up for HTN, hyperlipidemia, prediabetes, and vitamin D Def.  ? ?He has seen podiatry 05/2020 about wart on bottom of right foot- he did have an insole made. He continues to have pain in the right foot.  ? ?He does have hx of mixed bipolar type depression with ADD and anxiety though in recently years has been apparently stable on xanax, recently taking rarely, averages 1/2 tab 2-3 times a week. He reports doing well with current regimen, trying to taper off.  Having more stress, wife he was separated from died several weeks ago.  ? ?Patient was recently dx'd by Rheumatology (Dr Estanislado Pandy) with limited systemic sclerosis with intentions to start Imuran when he has completed 2 months of INH for a positive QuantiFERON test, however holding on due to elevated LFTs, continues with isoniazid.  High-resolution Chest CT in Jan found basilar fibrotic interstitial lung disease with probable UIP & patient has also been evaluated by Pulmonary by Dr Vaughan Browner.  He never did start the Imuran, last visit with her was over 2 years.  Gets shortness of breath easily. ? ?he currently continues to smoke 1/2-3/4 pack a day, 50 pack year history; discussed risks associated with smoking, patient is not ready to quit, but slowly tapering  down. He had low dose CT lung cancer screening 06/04/21. He does have some pain in lower lobes of lungs , has not had visit with Dr. Vaughan Browner in 2 years. ? ?Colonoscopy 08/01/20 several more polyps removed, next colonoscopy due 2025 ? ?BMI is Body mass index is 22.5 kg/m?., he has been working on diet, exercise limited by cardiovascular disease (PAD/claudication).  ?Wt Readings from Last 3 Encounters:  ?11/07/21 180 lb (81.6 kg)  ?10/24/21 175 lb (79.4 kg)  ?05/08/21 178 lb 9.6 oz (81 kg)  ? ?Hx/o pAfib s/p ablation in 2011 and was seen in 2019 for CPE and was back in Afib and started on Xarelto, and was admitted for initiation of Tikosyn with successful conversion in Oct 2019. He is followed by Dr. Rayann Heman and also Dr. [name] for PVD with claudification, peripheral angiography on  04/27/17 revealing a 99% calcified eccentric proximal right common iliac artery stenosis and bilateral long segment SFA chronic total occlusions which were highly calcified. Pt underwent direct back orbital rotational atherectomy, PTCA and cover stenting, however with persistent/recurrent sx, managed medically.  ? ?His blood pressure has been controlled at home, today their BP is BP: 122/60 ?He does not workout. He denies chest pain, shortness of breath, dizziness.  ? ?He continues to smoke 3/4 ppd. Chest CT scan on 01/19/20  Showed Aortic Atherosclerosis and chronic Interstitial ung disease- 12 month follow up is recommended. ? ?He is on cholesterol medication (atorvastatin 80 mg daily) and denies myalgias. His cholesterol is not at goal. He was previously evaluated by lipid  clinic for consideration of repatha, pending insurance approval at lipid clinic.The cholesterol last visit was:   ?Lab Results  ?Component Value Date  ? CHOL 155 05/08/2021  ? HDL 39 (L) 05/08/2021  ? [place] 85 05/08/2021  ? TRIG 211 (H) 05/08/2021  ? CHOLHDL 4.0 05/08/2021  ? ?He has not been working on diet and exercise for prediabetes, and denies increased appetite,  nausea, polydipsia, polyuria, visual disturbances, vomiting and weight loss. Last A1C in the office was:  ?Lab Results  ?Component Value Date  ? HGBA1C 5.5 05/08/2021  ? ?Last GFR ?Lab Results  ?Component Value Date  ?

## 2021-11-07 ENCOUNTER — Ambulatory Visit (INDEPENDENT_AMBULATORY_CARE_PROVIDER_SITE_OTHER): Payer: PPO | Admitting: Nurse Practitioner

## 2021-11-07 ENCOUNTER — Encounter: Payer: Self-pay | Admitting: Nurse Practitioner

## 2021-11-07 VITALS — BP 122/60 | HR 84 | Temp 97.7°F | Wt 180.0 lb

## 2021-11-07 DIAGNOSIS — F319 Bipolar disorder, unspecified: Secondary | ICD-10-CM | POA: Diagnosis not present

## 2021-11-07 DIAGNOSIS — I700 Atherosclerosis of aorta: Secondary | ICD-10-CM

## 2021-11-07 DIAGNOSIS — F17200 Nicotine dependence, unspecified, uncomplicated: Secondary | ICD-10-CM

## 2021-11-07 DIAGNOSIS — R0989 Other specified symptoms and signs involving the circulatory and respiratory systems: Secondary | ICD-10-CM | POA: Diagnosis not present

## 2021-11-07 DIAGNOSIS — I7300 Raynaud's syndrome without gangrene: Secondary | ICD-10-CM

## 2021-11-07 DIAGNOSIS — E782 Mixed hyperlipidemia: Secondary | ICD-10-CM

## 2021-11-07 DIAGNOSIS — R7309 Other abnormal glucose: Secondary | ICD-10-CM

## 2021-11-07 DIAGNOSIS — R6889 Other general symptoms and signs: Secondary | ICD-10-CM | POA: Diagnosis not present

## 2021-11-07 DIAGNOSIS — E559 Vitamin D deficiency, unspecified: Secondary | ICD-10-CM

## 2021-11-07 DIAGNOSIS — Z79899 Other long term (current) drug therapy: Secondary | ICD-10-CM

## 2021-11-07 DIAGNOSIS — J432 Centrilobular emphysema: Secondary | ICD-10-CM | POA: Diagnosis not present

## 2021-11-07 DIAGNOSIS — M349 Systemic sclerosis, unspecified: Secondary | ICD-10-CM

## 2021-11-07 DIAGNOSIS — I480 Paroxysmal atrial fibrillation: Secondary | ICD-10-CM

## 2021-11-07 DIAGNOSIS — D6869 Other thrombophilia: Secondary | ICD-10-CM

## 2021-11-07 DIAGNOSIS — J849 Interstitial pulmonary disease, unspecified: Secondary | ICD-10-CM | POA: Diagnosis not present

## 2021-11-07 DIAGNOSIS — Z0000 Encounter for general adult medical examination without abnormal findings: Secondary | ICD-10-CM

## 2021-11-07 DIAGNOSIS — Z0001 Encounter for general adult medical examination with abnormal findings: Secondary | ICD-10-CM | POA: Diagnosis not present

## 2021-11-07 DIAGNOSIS — I739 Peripheral vascular disease, unspecified: Secondary | ICD-10-CM

## 2021-11-08 LAB — TSH: TSH: 2.23 mIU/L (ref 0.40–4.50)

## 2021-11-08 LAB — CBC WITH DIFFERENTIAL/PLATELET
Absolute Monocytes: 543 cells/uL (ref 200–950)
Basophils Absolute: 74 cells/uL (ref 0–200)
Basophils Relative: 1.1 %
Eosinophils Absolute: 295 cells/uL (ref 15–500)
Eosinophils Relative: 4.4 %
HCT: 41.9 % (ref 38.5–50.0)
Hemoglobin: 14.2 g/dL (ref 13.2–17.1)
Lymphs Abs: 1621 cells/uL (ref 850–3900)
MCH: 31.6 pg (ref 27.0–33.0)
MCHC: 33.9 g/dL (ref 32.0–36.0)
MCV: 93.1 fL (ref 80.0–100.0)
MPV: 12 fL (ref 7.5–12.5)
Monocytes Relative: 8.1 %
Neutro Abs: 4167 cells/uL (ref 1500–7800)
Neutrophils Relative %: 62.2 %
Platelets: 144 10*3/uL (ref 140–400)
RBC: 4.5 10*6/uL (ref 4.20–5.80)
RDW: 12.7 % (ref 11.0–15.0)
Total Lymphocyte: 24.2 %
WBC: 6.7 10*3/uL (ref 3.8–10.8)

## 2021-11-08 LAB — COMPLETE METABOLIC PANEL WITH GFR
AG Ratio: 1.8 (calc) (ref 1.0–2.5)
ALT: 12 U/L (ref 9–46)
AST: 20 U/L (ref 10–35)
Albumin: 4.4 g/dL (ref 3.6–5.1)
Alkaline phosphatase (APISO): 70 U/L (ref 35–144)
BUN: 15 mg/dL (ref 7–25)
CO2: 25 mmol/L (ref 20–32)
Calcium: 9.3 mg/dL (ref 8.6–10.3)
Chloride: 106 mmol/L (ref 98–110)
Creat: 0.95 mg/dL (ref 0.70–1.28)
Globulin: 2.4 g/dL (calc) (ref 1.9–3.7)
Glucose, Bld: 131 mg/dL — ABNORMAL HIGH (ref 65–99)
Potassium: 4.2 mmol/L (ref 3.5–5.3)
Sodium: 140 mmol/L (ref 135–146)
Total Bilirubin: 0.6 mg/dL (ref 0.2–1.2)
Total Protein: 6.8 g/dL (ref 6.1–8.1)
eGFR: 86 mL/min/{1.73_m2} (ref >=60)

## 2021-11-08 LAB — LIPID PANEL
Cholesterol: 138 mg/dL (ref <200)
HDL: 45 mg/dL (ref >=40)
LDL Cholesterol (Calc): 74 mg/dL (calc)
Non-HDL Cholesterol (Calc): 93 mg/dL (calc) (ref <130)
Total CHOL/HDL Ratio: 3.1 (calc) (ref <5.0)
Triglycerides: 105 mg/dL (ref <150)

## 2021-11-08 LAB — VITAMIN D 25 HYDROXY (VIT D DEFICIENCY, FRACTURES): Vit D, 25-Hydroxy: 47 ng/mL (ref 30–100)

## 2021-12-06 ENCOUNTER — Telehealth: Payer: Self-pay

## 2021-12-06 NOTE — Telephone Encounter (Signed)
LM-12/06/21-Calling pt. To confirm CP visit and complete precall questions.  Total time spent: 7 min.

## 2021-12-10 ENCOUNTER — Ambulatory Visit: Payer: PPO | Admitting: Pharmacy Technician

## 2021-12-10 ENCOUNTER — Telehealth: Payer: Self-pay

## 2021-12-10 DIAGNOSIS — J449 Chronic obstructive pulmonary disease, unspecified: Secondary | ICD-10-CM

## 2021-12-10 DIAGNOSIS — I4891 Unspecified atrial fibrillation: Secondary | ICD-10-CM

## 2021-12-10 DIAGNOSIS — F17200 Nicotine dependence, unspecified, uncomplicated: Secondary | ICD-10-CM

## 2021-12-10 NOTE — Telephone Encounter (Signed)
LM-12/10/21-Searching for [employer] resources for pt. Calling [employer] for pt. Unable to reach. Office is closed, [place].   Total time spent: 5 min.  LM-12/12/21- Calling pt. To schedule CP f/u for 8/1 at 12:45pm phone visit and inform pt. I spoke w/ Ubaldo Glassing w/ [employer] was contacted and let pt. Know a case worker will be giving him a call to check for eligibility. Pt. Verbalized understanding and agreed.  Total time spent: 10 min.

## 2021-12-10 NOTE — Progress Notes (Signed)
Pharmacist Visit  Bradley Ferguson, Aboud [id number] 71 years, Male  DOB: Nov 26, 1950  M: [id number] Care Team: Ander Gaster, Debbra Riding  __________________________________________________ Chronic Conditions Patient's Chronic Conditions: Atrial Fibrillation, Hypertension (HTN), Chronic Obstructive Pulmonary Disease (COPD), Anxiety, Benign Prostatic Hyperplasia (BPH), Depression, Hyperlipidemia/Dyslipidemia (HLD), Cardiovascular Disease (CVD), Vitamin D deficiency, Raynauds syndrome, Centrilobular emphysema, Interstitial pulmonary disease, Dental caries, Colon polyps, Hepatic steatosis, Corn of foot, Acquired thrombophilia, Tobacco abuse, ADD, Bipolar I disorder, Claudication in peripheral vascular disease, Systemic scleroses, Right inguinal hernia, Elevated LFTs  Summary for PCP:  1. Continues to smoke. Not ready to quit. 2. Sees pulmonology 12/18/21 and has vascular study 12/23/21. 3. Last PFTs in 2021 but states breathing has gotten worse. PCP consider maintenance inhaler or referral for new PFTs. 4. Has issues affording Xarelto in donut hole. Patient to ask Cardiology for samples.  Disease Assessments Current BP: 122/60 Current HR: 84 taken on: 11/07/2021 Weight: 180 BMI: 22.50 Last GFR: 86 taken on: 11/07/2021 Visit Completed on: 12/10/2021 Why did the patient present?: CCM Follow up What does the patient do during the day?: Limited by money and declining health Who does the patient spend their time with and what do they do?: Lives with 4 roommates Lifestyle habits such as diet and exercise?: No exercise. Limited to convenience foods due to lack of money and cooking utensils Alcohol, tobacco, and illicit drug usage?: Current smoker 1/2 PPD Factors that may affect medication adherence?: Financial hardship (medication copays), Pill burden, Perceived lack of benefit of therapy Is Patient using [facility] pharmacy?: No Name and location of Current pharmacy: [facility] Current Rx  insurance plan: HTA Are meds delivered by current pharmacy?: Yes - by mail order pharmacy Would patient benefit from direct intervention of clinical lead in dispensing process to optimize clinical outcomes?: No Are [facility] pharmacy services available where patient lives?: Yes Is patient disadvantaged to use [facility]?: Yes Any additional demeanor/mood notes?: Visit conducted via phone today with patient. Complaints today include declining health, leg pain/weakness, and copay of Xarelto. Uses a pill box for organization. Has trouble affording Xarelto during donut hole.  SDOH: Accountable Health Communities Health-Related Social Needs Screening Tool ([email or web address]) SDOH questions were documented and reviewed (EMR or [employer]) within the past 3 months?: No What is your living situation today? (ref #1): I have a steady place to live Think about the place you live. Do you have problems with any of the following? (ref #2): None of the above Within the past 12 months, you worried that your food would run out before you got money to buy more (ref #3): Sometimes true Within the past 12 months, the food you bought just didn't last and you didn't have money to get more (ref #4): Sometimes true In the past 12 months, has lack of reliable transportation kept you from medical appointments, meetings, work or from getting things needed for daily living? (ref #5): Yes In the past 12 months, has the electric, gas, oil, or water company threatened to shut off services in your home? (ref #6): No How often does anyone, including family and friends, physically hurt you? (ref #7): Never (1) How often does anyone, including family and friends, insult or talk down to you? (ref #8): Sometimes (3) How often does anyone, including friends and family, threaten you with harm? (ref #9): Never (1) How often does anyone, including family and friends, scream or  curse at you? (ref #10): Rarely (2)  Hyperlipidemia/Dyslipidemia (HLD) Last Lipid panel on: 11/07/2021 TC (  Goal<200): 138 LDL: 74 HDL (Goal>40): 45 TG (Goal<150): 105 ASCVD 10-year risk?is:: Intermediate (7.5%-20%) ASCVD Risk Score: 17.7% Discussed with patient today?: No Drug: Lipitor '80mg'$  daily Assessment: Appropriate, Query Effectiveness  Chronic Obstructive Pulmonary Disease (COPD) Current FEV1/FVC: 96 Current FEV1: 4 taken on: 08/29/2019 Current Eosinophils: 295 taken on: 11/07/2021 CAT Score: Unknown Discussed with patient today?: Yes Gold grade: 1 (FEV1 > 80 %) Gold group: A (low sx, < 2 exacerbations / yr) Exacerbations since last visit with pharmacist?: No Has there been change in patients smoking/vaping habit since last visit with pharmacist?: No Home oxygen therapy: No Frequency of SABA/SAMA use: 1-2 times per week CAT Assessment: Done How often are you coughing?: 3 How much mucus are you feeling in your chest?: 2 How much chest tightness are you having?: 1 How is your breath when you walk up a hill or flight of stairs?: 5 (When I walk up a hill or a flight of stairs I am completely out of breath) How are your limitations to doing activities at home?: 3 How would you rate your confidence in leaving your home due to your lung condition?: 2 How are you sleeping?: 2 How is your energy level?: 4 CAT Score: 22 Last low-dose Chest CT (if past or current smoker): 04/2021 We discussed: Smoking cessation, Inhaler technique, Plan for acute exacerbations, COPD pathophysiology, treatment goals, management, identifying signs of acute exacerbations and exacerbation prevention methods with patient for 10-15 minutes., The impact of anxiety on COPD symptoms Assessment:: Uncontrolled Drug: Albuterol HFA  Assessment: Query Appropriateness Additional Info: Seeing Pulmonology 12/18/21 Plan to Start: Consider maintenance inhaler Plan to Order: PFTs Plan to Review: Notes from visit  on 12/18/21 Plan to Counsel: Weight loss Pharmacist Follow up: F/U 01/28/22  BPH Assessment Current PSA: 1.14 taken on: 10/10/2020 Discussed with patient today?: No  Atrial Fibrillation Current INR: 1.0 taken on: 04/16/2017 Previous INR: 3.2 taken on: 06/06/2010 Discussed with patient today?: Yes CHADS2-VASc: Hypertension (1), Vascular Disease (1), Age 6 to 59 years (1) CHADS2-VASc Score: 3 Type AF: Persistent Type of control: Rhythm controlled We discussed: Signs and symptoms of bleeding associated with anticoagulant use, Importance of adherence with anticoagulant therapy and regular monitoring, Risks of atrial fibrillation (CVA, MI, heart failure) Assessment:: Controlled Drug: Xarelto '20mg'$  daily Assessment: Appropriate, Effective, Safe, Accessible Drug: Tikosyn '500mg'$  BID Assessment: Appropriate, Effective, Safe, Accessible Additional Info: No issues noted Pharmacist Follow up: Stable  Tobacco Use Disorder Discussed with patient today?: Yes Has there been change in patients smoking/vaping habit since last visit with pharmacist?: No Is patient currently Smoking or Vaping?: Yes How soon after you wake up do you smoke your first cigarette?: After 30 minutes (1 points) Do you find it difficult to refrain from smoking in places where it is forbidden (church, Art therapist, Archivist, restaurants)?: No (0 points) Which cigarette would you hate most to give up?: After a hot drink (0 points) How many cigarettes per day do you smoke?: 21 to 30 (2 points) Do you smoke more during the morning hours than during the rest of the day?: Yes (1 point) Faegerstrom score:: 4 What health problems has your smoking contributed to?: Lung disease, vascular disease, HLD How motivated are you to quit smoking in the next 30 days (scale 1-10)?: 3 How confident are you that you can quit smoking (scale 1-10)?: 3 Is patient ready to quit smoking?: No What do you like about smoking?: Habit, helps nerves We  discussed: Annual Low-dose chest CT, The potential for increase effects from  caffeine after quitting smoking and to reduce caffeine if needed., Spent 10-15 minutes educating on risk of tobacco and nicotine use and discussing cessation methods Patient has tried and failed: Wellbutrin Assessment:: Uncontrolled Additional Info: Patient knows he should quit and he would have more money but he says "If its not going to reverse the problems its already caused why stop now?" Plan to Review: At each CCM visit Plan to Counsel: Counseled on smoking cessation classes and counseling Pharmacist Follow up: 01/28/22  Exercise, Diet and Non-Drug Coordination Needs Additional exercise counseling points. We discussed: aiming to lose 5 to 10% of body weight through lifestyle modifications, decreasing sedentary behavior Additional diet counseling points. We discussed: key components of the DASH diet, key components of a low-carb eating plan, aiming to consume at least 8 cups of water day Discussed Non-Drug Care Coordination Needs: Yes Does Patient have Medication financial barriers?: Yes Patient has insurance  Designer, fashion/clothing): HTA and has trouble affording medications  (list number): Xarelto Goal: (pharmacist to fill in plan to resolve) i.e. change medications, samples, PAP.: Samples  CPP Prep: 41mn CPP OV: 353m CPP Doc: 2176mCPP Care Plan: [id number] Summary Patient Risk: Moderate Next CCM Follow Up: 01/28/22 Next AWV: 11/10/22 Next PCP Visit: 05/20/22  AverMarda StalkerarmD Clinical Pharmacist AverNaida Sleightton'@[facility]'$ .care [phone]

## 2021-12-18 ENCOUNTER — Encounter: Payer: Self-pay | Admitting: Pulmonary Disease

## 2021-12-18 ENCOUNTER — Ambulatory Visit: Payer: PPO | Admitting: Pulmonary Disease

## 2021-12-18 VITALS — BP 104/72 | HR 103 | Temp 98.2°F | Ht 73.0 in | Wt 180.2 lb

## 2021-12-18 DIAGNOSIS — J849 Interstitial pulmonary disease, unspecified: Secondary | ICD-10-CM

## 2021-12-18 NOTE — Patient Instructions (Signed)
We will order high-resolution CT and PFTs for evaluation of the lung Referral back to Dr. Estanislado Pandy, rheumatology to reestablish care Continue to work on smoking cessation  Follow-up in 2 to 3 months.

## 2021-12-18 NOTE — Progress Notes (Signed)
TALVIN CHRISTIANSON    [id number]    1951/02/26  Primary Care Physician:McKeown, Gwyndolyn Saxon, MD  Referring Physician: Alycia Rossetti, NP [facility] [place],  [place] [address]  Chief complaint: Follow-up for interstitial lung disease secondary to limited systemic sclerosis, emphysema  HPI: 71 year old smoker smoker with history of hyperlipidemia, emphysema, atrial fibrillation, peripheral artery disease.  Referred for evaluation of emphysema and abnormal CT showing pulmonary fibrosis  Chief complaint is dyspnea on exertion which is slowly worsening over the past 3 years.  This was initially attributed to severe peripheral vascular disease.  Also has cough, minimal sputum production.  Denies any fevers, chills.  Has back pain attributed to arthritis.  Also reports Raynaud's phenomena, occasional difficulty swallowing.  No dry mouth or dry eyes  Pets: Has 2 dogs.  No cats, birds, farm animals Occupation: Used to work in [employer], as a Research officer, trade union and odd jobs. Exposures: May have been exposed to asbestos.  Reports that his previous has had mold in the basement.  He moved out of it 2 years ago.  He also had feather pillows and comforters but got rid of them 2 years ago Smoking history: 45-pack-year smoker.  Continues to smoke up to 1 pack/day Travel history: No significant travel history Relevant family history: No significant family history of lung disease.  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  His LFTs were then elevated and Imuran initiation was held off  He has not been seen by pulmonary or rheumatology since 2021 as he did not keep up with his appointments. Here for reevaluation now.  States that he has dyspnea on exertion and limited exercise capacity mainly due to peripheral vascular  disease and is being evaluated by vascular surgery.  Continues to smoke  Outpatient Encounter Medications as of 12/18/2021  Medication Sig   albuterol (VENTOLIN HFA) 108 (90 Base) MCG/ACT inhaler Use  2 inhalations  15 minutes apart  every 4 hours  to rescue Asthma Attack   ALPRAZolam (XANAX) 1 MG tablet Take 1/2-1 tablet 2 - 3 x /day ONLY if needed for Anxiety Attack &  limit to 5 days /week to avoid addiction (Patient taking differently: Take 1/2-1 tablet 2 - 3 x /day ONLY if needed for Anxiety Attack &  limit to 5 days /week to avoid addiction)   aspirin EC 81 MG tablet Take 81 mg by mouth daily. Swallow whole.   atorvastatin (LIPITOR) 80 MG tablet Take 1 tablet Daily for Cholesterol   Cholecalciferol 250 MCG (10000 UT) CAPS Take 10,000 Units by mouth daily.    dofetilide (TIKOSYN) 500 MCG capsule Take 1 capsule (500 mcg total) by mouth 2 (two) times daily.   pyridOXINE (VITAMIN B-6) 100 MG tablet Take 100 mg by mouth daily.   rivaroxaban (XARELTO) 20 MG TABS tablet Take 1 tablet Daily to Prevent Blood Clots   No facility-administered encounter medications on file as of 12/18/2021.   Physical Exam: Gen:      No acute distress HEENT:  EOMI, sclera anicteric Neck:     No masses; no thyromegaly Lungs:    Bibasal crackles CV:         Regular rate and rhythm; no murmurs Abd:      + bowel sounds; soft, non-tender; no palpable masses, no distension Ext:  No edema; adequate peripheral perfusion Skin:      Warm and dry; no rash Neuro: alert and oriented x 3 Psych: normal mood and affect   Data Reviewed: Imaging: Low-dose screening CT 01/06/19-small pulmonary nodules measuring up to 4 mm.  Peripheral groundglass attenuation with reticulation, traction bronchiectasis with basal gradient.  No honeycombing.  CT high-resolution 07/28/2019-basilar predominant fibrotic interstitial lung disease with no honeycombing.  There is subtle subpleural sparing.  Probable UIP versus NSIP.  CT lung cancer  screening 06/04/2021-emphysema, stable appearance of basilar predominant fibrotic interstitial lung disease I have reviewed the images personally  PFTs: 02/29/2020 FVC 4.23 [81%), FEV1 3.04 [78%], F/F 72, DLCO 20.04 [67%] Moderate diffusion defect.  Labs: CBC 06/20/2019-WBC 7.1, eos 6.9%, absolute eosinophil count 490  CTD serologies 07/07/2019-positive ANA 1:320 [nuclear, speckled], 1:1280 [nuclear, nucleolar]  Cardiac: Echocardiogram 03/08/2020-LVEF 60-65%, mild LVH, grade 2 diastolic dysfunction, normal PA systolic pressure, estimated RVSP 26.4  Assessment:  Interstitial lung disease, pulmonary fibrosis Limited systemic sclerosis High-res CT reviewed with probable UIP versus NSIP pattern. He does report exposure to mold, down comforters and possible asbestos in the past. Probably has connective tissue disease with symptoms of Raynaud's and difficulty swallowing and significant ANA elevation.  Has been evaluated by rheumatology and given a diagnosis of limited systemic sclerosis Imuran recommended but has not been initiated since he needed to complete INH therapy for positive QuantiFERON test  We discussed starting therapy as planned for his interstitial lung disease but he is not thrilled about taking additional medications.  He has agreed to see rheumatology again I will get high-res CT and PFTs for reevaluation of his lungs  Active smoker Smoking cessation discussed with patient.  He is not ready to quit yet.  Will reassess at return visit.  Time spent counseling- 5 mins Continue low-dose screening CT of the chest  He was on Trelegy inhaler but it was stopped as it was expensive and was not helping with his breathing.  Reassess on PFTs  Plan/Recommendations: - High-resolution CT, PFTs - Refer back to Dr. Estanislado Pandy, rheumatology - Smoking cessation  Marshell Garfinkel MD [facility] 12/18/2021, 3:05 PM  CC: Alycia Rossetti, NP

## 2021-12-18 NOTE — Addendum Note (Signed)
Addended by: Lonzo Cloud on: 12/18/2021 03:45 PM   Modules accepted: Orders

## 2021-12-23 ENCOUNTER — Ambulatory Visit (HOSPITAL_COMMUNITY)
Admission: RE | Admit: 2021-12-23 | Discharge: 2021-12-23 | Disposition: A | Discharge status: Home / Self Care | Payer: PPO | Source: Ambulatory Visit | Attending: Cardiology | Admitting: Cardiology

## 2021-12-23 ENCOUNTER — Ambulatory Visit (HOSPITAL_BASED_OUTPATIENT_CLINIC_OR_DEPARTMENT_OTHER)
Admission: RE | Admit: 2021-12-23 | Discharge: 2021-12-23 | Disposition: A | Discharge status: Home / Self Care | Payer: PPO | Source: Ambulatory Visit | Attending: Cardiovascular Disease | Admitting: Cardiovascular Disease

## 2021-12-23 DIAGNOSIS — Z95828 Presence of other vascular implants and grafts: Secondary | ICD-10-CM | POA: Insufficient documentation

## 2021-12-23 DIAGNOSIS — I739 Peripheral vascular disease, unspecified: Secondary | ICD-10-CM

## 2021-12-27 DIAGNOSIS — F17200 Nicotine dependence, unspecified, uncomplicated: Secondary | ICD-10-CM | POA: Diagnosis not present

## 2021-12-27 DIAGNOSIS — I4891 Unspecified atrial fibrillation: Secondary | ICD-10-CM | POA: Diagnosis not present

## 2021-12-27 DIAGNOSIS — J449 Chronic obstructive pulmonary disease, unspecified: Secondary | ICD-10-CM | POA: Diagnosis not present

## 2022-01-16 ENCOUNTER — Other Ambulatory Visit: Payer: PPO

## 2022-01-16 NOTE — Progress Notes (Deleted)
Office Visit Note  Patient: Bradley Ferguson             Date of Birth: 1950/11/25           MRN: [id number]             PCP: Bradley Pinto, MD Referring: Bradley Pinto, MD Visit Date: 01/30/2022 Occupation: '@GUAROCC'$ @  Subjective:  No chief complaint on file.   History of Present Illness: Bradley Ferguson is a 71 y.o. male ***   Activities of Daily Living:  Patient reports morning stiffness for *** {minute/hour:19697}.   Patient {ACTIONS;DENIES/REPORTS:21021675::"Denies"} nocturnal pain.  Difficulty dressing/grooming: {ACTIONS;DENIES/REPORTS:21021675::"Denies"} Difficulty climbing stairs: {ACTIONS;DENIES/REPORTS:21021675::"Denies"} Difficulty getting out of chair: {ACTIONS;DENIES/REPORTS:21021675::"Denies"} Difficulty using hands for taps, buttons, cutlery, and/or writing: {ACTIONS;DENIES/REPORTS:21021675::"Denies"}  No Rheumatology ROS completed.   PMFS History:  Patient Active Problem List   Diagnosis Date Noted  . Hepatic steatosis 07/05/2020  . Corn of foot 06/12/2020  . Elevated LFTs 06/12/2020  . Essential hypertension 04/29/2020  . Right inguinal hernia 03/19/2020  . TB lung, latent 12/19/2019  . 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 Centrilobular emphysema ([place]) 11/24/2019  . Interstitial pulmonary disease ([place]) 11/24/2019  . Positive ANA (antinuclear antibody) 11/24/2019  . Atherosclerosis of aorta ([place]) by Chest CT scan on 01/19/2020 06/30/2019  . Chronic obstructive pulmonary disease ([place]) 06/30/2019  . Acquired thrombophilia ([place]) 05/10/2019  . Colon polyps 12/06/2018  . Visit for monitoring Tikosyn therapy 04/27/2018  . Claudication in peripheral vascular disease ([place]) 04/16/2017  . Labile hypertension 02/29/2016  . BPH 02/29/2016  . Hyperlipidemia, mixed 11/12/2015  . Vitamin D deficiency 11/12/2015  . Bipolar I disorder ([place]) 08/13/2015  . Atrial fibrillation ([place]) 08/13/2015  . Attention deficit disorder 08/08/2010  . Dental caries 08/08/2010   . ANXIETY DEPRESSION 06/06/2010  . TOBACCO ABUSE 06/06/2010  . RAYNAUDS SYNDROME 06/06/2010    Past Medical History:  Diagnosis Date  . Anxiety   . Atrial fibrillation ([place])   . Depression   . PVD (peripheral vascular disease) ([place])   . Raynaud's syndrome   . Systemic sclerosis ([place])   . TB lung, latent 12/19/2019  . Transaminitis 06/18/2020    Family History  Problem Relation Age of Onset  . Cancer Mother        small cell lung cancer  . Cancer Father        unsure type  . Colon cancer Neg Hx   . Rectal cancer Neg Hx   . Heart disease Neg Hx   . Pancreatic cancer Neg Hx   . Stomach cancer Neg Hx   . Esophageal cancer Neg Hx    Past Surgical History:  Procedure Laterality Date  . ATRIAL FIBRILLATION ABLATION    . CATARACT EXTRACTION Right   . COLONOSCOPY    . EYE SURGERY    . FRACTURE SURGERY Left 1976  . INGUINAL HERNIA REPAIR Right 03/23/2020   Procedure: OPEN RIGHT INGUINAL HERNIA REPAIR WITH MESH;  Surgeon: Bradley Hausen, MD;  Location: [facility];  Service: General;  Laterality: Right;  . INSERTION OF MESH Right 03/23/2020   Procedure: INSERTION OF MESH;  Surgeon: Bradley Hausen, MD;  Location: [facility];  Service: General;  Laterality: Right;  . LOWER EXTREMITY INTERVENTION N/A 04/27/2017   Procedure: LOWER EXTREMITY INTERVENTION;  Surgeon: Bradley Harp, MD;  Location: [facility];  Service: Cardiovascular;  Laterality: N/A;  . PERIPHERAL VASCULAR ATHERECTOMY Right 04/27/2017   Procedure: PERIPHERAL VASCULAR ATHERECTOMY;  Surgeon: Bradley Harp, MD;  Location:  [facility];  Service: Cardiovascular;  Laterality: Right;  Iliac  . PERIPHERAL VASCULAR INTERVENTION Right 04/27/2017   Procedure: PERIPHERAL VASCULAR INTERVENTION;  Surgeon: Bradley Harp, MD;  Location: [facility];  Service: Cardiovascular;  Laterality: Right;  Iliac   Social History   Social History Narrative  . Not on file   Immunization History  Administered Date(s)  Administered  . Influenza Whole 04/30/2010  . Influenza, High Dose Seasonal PF 03/20/2017, 03/29/2018, 04/06/2019, 04/30/2020, 05/08/2021  . Moderna Sars-Covid-2 Vaccination 05/12/2020  . PFIZER(Purple Top)SARS-COV-2 Vaccination 08/04/2019, 08/29/2019  . Pneumococcal Conjugate-13 02/05/2017  . Pneumococcal Polysaccharide-23 04/30/2010, 03/29/2018  . Td 06/30/2013     Objective: Vital Signs: There were no vitals taken for this visit.   Physical Exam   Musculoskeletal Exam: ***  CDAI Exam: CDAI Score: -- Patient Global: --; Provider Global: -- Swollen: --; Tender: -- Joint Exam 01/30/2022   No joint exam has been documented for this visit   There is currently no information documented on the homunculus. Go to the Rheumatology activity and complete the homunculus joint exam.  Investigation: No additional findings.  Imaging: VAS [place] ABI WITH/WO TBI  Result Date: 12/23/2021  LOWER EXTREMITY DOPPLER STUDY Patient Name:  Bradley Ferguson  Date of Exam:   12/23/2021 Medical Rec #: [id number]       Accession #:    [id number] Date of Birth: [birth year]       Patient Gender: M Patient Age:   15 years Exam Location:  [facility] Procedure:      VAS [place] ABI WITH/WO TBI Referring Phys: Bradley Ferguson --------------------------------------------------------------------------------  Indications: Peripheral artery disease, and h/o bilateral SFA occlusions and              right common iliac artery stent placement. Patient reports              continued bilateral lower extremity claudication symptoms after              walking about 1-2 minutes. These symptoms have worsened since prior              exam. He also reports continued right cold foot. High Risk Factors: Hypertension, hyperlipidemia, current smoker.  Vascular Interventions: Successful diamondback orbital rotation arthrectomy, PT                         and covered stent at a subtotal calcified proximal right                         common iliac  artery stenosis on 04/27/2017. Comparison Study: In 11/2020, a lower arterial Doppler showed an ABI of .59 on                   the right and .65 on the left. Performing Technologist: Sharlett Iles RVT  Examination Guidelines: A complete evaluation includes at minimum, Doppler waveform signals and systolic blood pressure reading at the level of bilateral brachial, anterior tibial, and posterior tibial arteries, when vessel segments are accessible. Bilateral testing is considered an integral part of a complete examination. Photoelectric Plethysmograph (PPG) waveforms and toe systolic pressure readings are included as required and additional duplex testing as needed. Limited examinations for reoccurring indications may be performed as noted.  ABI Findings: +---------+------------------+-----+----------+---------+ Right    Rt Pressure (mmHg)IndexWaveform  Comment   +---------+------------------+-----+----------+---------+ Brachial 135                                        +---------+------------------+-----+----------+---------+  PTA      78                0.58 monophasic          +---------+------------------+-----+----------+---------+ PERO                            absent    inaudible +---------+------------------+-----+----------+---------+ DP       65                0.48 monophasic          +---------+------------------+-----+----------+---------+ Great Toe69                0.51 Abnormal            +---------+------------------+-----+----------+---------+ +---------+------------------+-----+----------+---------+ Left     Lt Pressure (mmHg)IndexWaveform  Comment   +---------+------------------+-----+----------+---------+ Brachial 126                                        +---------+------------------+-----+----------+---------+ PTA      73                0.54 monophasic          +---------+------------------+-----+----------+---------+ PERO                             absent    inaudible +---------+------------------+-----+----------+---------+ DP       65                0.48 monophasic          +---------+------------------+-----+----------+---------+ Great Toe58                0.43 Abnormal            +---------+------------------+-----+----------+---------+ +-------+-----------+-----------+------------+------------+ ABI/TBIToday's ABIToday's TBIPrevious ABIPrevious TBI +-------+-----------+-----------+------------+------------+ Right  .58        .51        .59         .39          +-------+-----------+-----------+------------+------------+ Left   .54        .43        .65         .52          +-------+-----------+-----------+------------+------------+  Bilateral ABIs and TBIs appear essentially unchanged compared to prior study on 12/20/2020.  Summary: Right: Resting right ankle-brachial index indicates moderate right lower extremity arterial disease. The right toe-brachial index is abnormal. Left: Resting left ankle-brachial index indicates moderate left lower extremity arterial disease. The left toe-brachial index is abnormal. *See table(s) above for measurements and observations. See Aortoiliac duplex report. Suggest follow up study in 12 months. Electronically signed by Quay Burow MD on 12/23/2021 at 8:05:18 PM.    Final    VAS US AORTA/IVC/ILIACS  Result Date: 12/23/2021 ABDOMINAL AORTA STUDY Patient Name:  RONEN BROMWELL  Date of Exam:   12/23/2021 Medical Rec #: [id number]       Accession #:    [id number] Date of Birth: October 16, 1950       Patient Gender: M Patient Age:   27 years Exam Location:  [facility] Procedure:      VAS US AORTA/IVC/ILIACS Referring Phys: Quay Burow --------------------------------------------------------------------------------  Indications: Peripheral arterial disease, and h/o bilateral SFA occlusions and              right common iliac  artery stent placement. Patient reports               continued bilateral lower extremity claudication symptoms after              walking about 1-2 minutes. These symptoms have worsened since prior              exam. He also reports continued right cold foot.               Today's ABIs are .58 on the right and .54 on the left. Risk Factors: Hypertension, hyperlipidemia, current smoker. Vascular Interventions: Successful diamondback orbital rotation arthrectomy, PT                         and covered stent at a subtotal calcified proximal right                         common iliac artery stenosis on 04/27/2017. Limitations: Air/bowel gas.  Comparison Study: In 11/2020, an aortoiliac duplex showed showed an ectatic                   infrarenal mid abdominal aorta, measuring 2.8 cm. Widely                   patent right common iliac artery stent without evidence of                   stenosis and >50% stenosis in the left common and external                   iliac arteries. Performing Technologist: Sharlett Iles RVT  Examination Guidelines: A complete evaluation includes B-mode imaging, spectral Doppler, color Doppler, and power Doppler as needed of all accessible portions of each vessel. Bilateral testing is considered an integral part of a complete examination. Limited examinations for reoccurring indications may be performed as noted.  Abdominal Aorta Findings: +-------------+-------+----------+----------+----------------+--------+--------+ Location     AP (cm)Trans (cm)PSV (cm/s)Waveform        ThrombusComments +-------------+-------+----------+----------+----------------+--------+--------+ Proximal     2.10   2.10      84                                         +-------------+-------+----------+----------+----------------+--------+--------+ Mid          3.00   3.00      29                                fusiform +-------------+-------+----------+----------+----------------+--------+--------+ Distal       2.30   2.40      25                                          +-------------+-------+----------+----------+----------------+--------+--------+ RT CIA Prox                   140       triphasic                        +-------------+-------+----------+----------+----------------+--------+--------+ RT EIA Prox  204       biphasic                         +-------------+-------+----------+----------+----------------+--------+--------+ RT EIA Mid                    100       biphasic                         +-------------+-------+----------+----------+----------------+--------+--------+ RT EIA Distal                 93        biphasic to                                                              monophasic                       +-------------+-------+----------+----------+----------------+--------+--------+ LT CIA Prox                   103       monophasic                       +-------------+-------+----------+----------+----------------+--------+--------+ LT CIA Mid                    228       monophasic                       +-------------+-------+----------+----------+----------------+--------+--------+ LT CIA Distal                 315       monophasic                       +-------------+-------+----------+----------+----------------+--------+--------+ LT EIA Prox                   298       monophasic                       +-------------+-------+----------+----------+----------------+--------+--------+ LT EIA Mid                    60        monophasic                       +-------------+-------+----------+----------+----------------+--------+--------+ LT EIA Distal                 50        monophasic                       +-------------+-------+----------+----------+----------------+--------+--------+ IVC/Iliac Findings: +--------+------+--------+--------+   IVC   PatentThrombusComments +--------+------+--------+--------+ IVC  Proxpatent                 +--------+------+--------+--------+   Right Stent(s): +-------------------+--------+--------+----------------------+--------+ Common Iliac ArteryPSV cm/sStenosisWaveform              Comments +-------------------+--------+--------+----------------------+--------+ Prox to Stent      128             biphasic                       +-------------------+--------+--------+----------------------+--------+  Proximal Stent     116             biphasic                       +-------------------+--------+--------+----------------------+--------+ Mid Stent          117             biphasic                       +-------------------+--------+--------+----------------------+--------+ Distal Stent       113             biphasic to monophasic         +-------------------+--------+--------+----------------------+--------+ Distal to Stent    109             biphasic                       +-------------------+--------+--------+----------------------+--------+ Patent right common iliac artery stent without evidence of stenosis.  Summary: Abdominal Aorta: Aortoiliac atherosclerosis. There is evidence of abnormal dilatation of the mid Abdominal aorta. The largest aortic measurement is 3.0 cm. The largest aortic diameter has increased compared to prior exam. Previous diameter measurement was 2.8 cm obtained on 12/20/2020. Stenosis: +--------------------+-------------+-----------+-----------------------+ Location            Stenosis     Stent      Comments                +--------------------+-------------+-----------+-----------------------+ Right Common Iliac               no stenosis                        +--------------------+-------------+-----------+-----------------------+ Left Common Iliac   >50% stenosis           mid and distal segments +--------------------+-------------+-----------+-----------------------+ Right External Iliac>50% stenosis            ostial/proximal segment +--------------------+-------------+-----------+-----------------------+ Left External Iliac >50% stenosis           ostium                  +--------------------+-------------+-----------+-----------------------+ Patent right common iliac artery stent without evidence of stenosis. Ostial/proximal right external iliac artery is now >50% stenosed, low end range. >50% stenosis in the mid left common iliac artery. >50% stenosis in the distal left common iliac artery/ostium left external iliac artery. IVC/Iliac: Patent IVC.  *See table(s) above for measurements and observations. See ABI report. Suggest follow up study in 12 months.  Electronically signed by Quay Burow MD on 12/23/2021 at 8:03:24 PM.    Final     Recent Labs: Lab Results  Component Value Date   WBC 6.7 11/07/2021   HGB 14.2 11/07/2021   PLT 144 11/07/2021   NA 140 11/07/2021   K 4.2 11/07/2021   CL 106 11/07/2021   CO2 25 11/07/2021   GLUCOSE 131 (H) 11/07/2021   BUN 15 11/07/2021   CREATININE 0.95 11/07/2021   BILITOT 0.6 11/07/2021   ALKPHOS 73 08/10/2017   AST 20 11/07/2021   ALT 12 11/07/2021   PROT 6.8 11/07/2021   ALBUMIN 4.7 08/10/2017   CALCIUM 9.3 11/07/2021   GFRAA 82 11/12/2020   QFTBGOLDPLUS POSITIVE (A) 11/24/2019    Speciality Comments: No specialty comments available.  Procedures:  No procedures performed Allergies: Cymbalta [duloxetine hcl], Effexor [venlafaxine], Wellbutrin [bupropion], and Zoloft [sertraline hcl]   Assessment /  Plan:     Visit Diagnoses: No diagnosis found.  Orders: No orders of the defined types were placed in this encounter.  No orders of the defined types were placed in this encounter.   Face-to-face time spent with patient was *** minutes. Greater than 50% of time was spent in counseling and coordination of care.  Follow-Up Instructions: No follow-ups on file.   Earnestine Mealing, CMA  Note - This record has been created using  Editor, commissioning.  Chart creation errors have been sought, but may not always  have been located. Such creation errors do not reflect on  the standard of medical care.

## 2022-01-27 ENCOUNTER — Telehealth: Payer: Self-pay

## 2022-01-27 NOTE — Telephone Encounter (Signed)
LM-01/27/22-Called pt. And confirmed CP f/u for 8/1 at 12:45PM. Precall questions completed. Pt. Mentioned he has not received a call from [employer]. Apologized to pt. And informed him I will look into it and will also search other options for food insecurity as well as PAP for his Tikosyn and Xarelto.  Total time spent: 10 min.

## 2022-01-27 NOTE — Telephone Encounter (Signed)
LM-01/27/22-Chart Prep started, Reviewing OV, Consults, Hospital visits, Labs and medication changes. Chart prep completed.  Total time spent: 36 min

## 2022-01-28 ENCOUNTER — Ambulatory Visit: Payer: PPO | Admitting: Pharmacy Technician

## 2022-01-28 DIAGNOSIS — Z79899 Other long term (current) drug therapy: Secondary | ICD-10-CM

## 2022-01-29 ENCOUNTER — Telehealth: Payer: Self-pay

## 2022-01-29 NOTE — Progress Notes (Signed)
Pharmacist Visit   Bradley Ferguson, Bradley Ferguson 37 years, Male  DOB: 29-Nov-1950  M: [phone]  __________________________________________________ Chronic Conditions Patient's Chronic Conditions: Atrial Fibrillation, Chronic Obstructive Pulmonary Disease (COPD), Hypertension (HTN), Benign Prostatic Hyperplasia (BPH), Anxiety, Depression, Cardiovascular Disease (CVD), Tobacco Use Disorder, Other, Hyperlipidemia/Dyslipidemia (HLD), Vitamins/Supplements List Other Conditions (separated by comma): Raynauds, Centrilobular emphysema, Interstitial pulmonary disease, Dental caries, Colon polyps, Hepatic steatosis, Corn of foot, Acquired thrombophilia, ADD, Bipolar 1, Vitamin D deficiency, Claudication in   Summary for PCP:  1. Will review PAP for Xarelto for patient. 2. Will get patient transportation resources.  Disease Assessments Visit Completed on: 01/28/2022 SDOH: Accountable Health Communities Health-Related Social Needs Screening Tool (BloggerBowl.es) SDOH questions were documented and reviewed (EMR or [employer]) within the past 12 months or since hospitalization?: No What is your living situation today? (ref #1): I have a steady place to live Think about the place you live. Do you have problems with any of the following? (ref #2): None of the above Within the past 12 months, you worried that your food would run out before you got money to buy more (ref #3): Often true Within the past 12 months, the food you bought just didn't last and you didn't have money to get more (ref #4): Often true In the past 12 months, has lack of reliable transportation kept you from medical appointments, meetings, work or from getting things needed for daily living? (ref #5): Yes In the past 12 months, has the electric, gas, oil, or water company threatened to shut off services in your home? (ref #6): No How often does anyone, including family and friends, physically  hurt you? (ref #7): Never (1) How often does anyone, including family and friends, insult or talk down to you? (ref #8): Never (1) How often does anyone, including friends and family, threaten you with harm? (ref #9): Never (1) How often does anyone, including family and friends, scream or curse at you? (ref #10): Never (1)  Medication Adherence Does the [facility] have access to medication refill history?: Yes Medication adherence rates for the STAR metric medications:  Atorvastatin '80mg'$ - 04/29/21 (90DS), 09/19/21 (90DS)  Medication adherence rates for non-STAR metric medications:  Dofetilide 500MCG- 07/08/21 (90DS), 10/30/21 DS not listed Xarelto '20mg'$ - 04/29/21 (90DS), 07/08/21 (90DS)  Factors that may affect medication adherence?: Low literacy / Education level, Financial hardship (medication copays), Lack of understanding / insight of condition, Low motivation Is Patient using [facility] pharmacy?: No Name and location of Current pharmacy: [facility] Pharmacy Current Rx insurance plan: HTA Are meds synced by current pharmacy?: No Are meds delivered by current pharmacy?: No - delivery not available Would patient benefit from direct intervention of clinical lead in dispensing process to optimize clinical outcomes?: No Are [facility] pharmacy services available where patient lives?: Yes Is patient disadvantaged to use [facility]?: No [facility] services reviewed with patient and patient wishes to change pharmacy?: No Select reason patient declined to change pharmacies: Ability to purchase non-pharmacy items at the same time Does patient experience delays in picking up medications due to transportation concerns (getting to pharmacy)?: No Medication organization: [employer] We discussed: Investigating patient assistance options (PAP, grants, samples) and will have  HC reach out with additional details Assessment:: Potentially Non-adherent Plan/Follow up: PAP exploration for Xarelto  Atrial  Fibrillation Most recent INR: 1.0 taken on: 04/16/2017 Previous INR: 3.2 taken on: 06/06/2010 Assessed today?: No Drug: Aspirin '81mg'$  1 tab  QD Drug: Tikosyn 500MCG- 1 cap BID Pharmacist Assessment: Appropriate, Effective, Safe, Query  Accessibility Drug: Xarelto '20mg'$ - 1 tab QD Pharmacist Assessment: Appropriate, Effective, Safe, Query Accessibility Plan/Follow up: PAP for Xarelto  Vitamins / Supplements Additional Info:  Cholecalciferol 250MCG (1000U) 1 cap daily Vitamin B-6 '100mg'$  QD  Preventative Health Care Gap: Colorectal cancer screening: Addressed Care Gap: Breast cancer screening: Patient excluded from population (Age > 52, hx of bilateral mastectomy, frailty, hospice services) Care Gap: Annual Wellness Visit (AWV): Addressed Immunizations needed: Zoster Additional exercise counseling points. We discussed: aiming to lose 5 to 10% of body weight through lifestyle modifications, targeting at least 151 minutes per week of moderate-intensity aerobic exercise., encouraging participation in insurance-covered exercise program through local gym (i.e. [employer]), decreasing sedentary behavior Additional diet counseling points. We discussed: key components of the DASH diet, key components of a low-carb eating plan, aiming to consume at least 8 cups of water day  CPP Televisit: 61mn CPP OV: 180m  Clinical Summary Next CCM Follow Up: 1 year Next AWV: 11/10/22 11:00AM Next PCP Visit: 05/20/22 10:30AM  Pharmacy Interventions Pharmacist Interventions discussed: Yes Accessibility: Financial assistance SDOH: Transportation referrals, Food insecurity referrals  AvMarda StalkerPharmD Clinical Pharmacist AvNaida Sleightelton'[email or web address] .care [phone]

## 2022-01-29 NOTE — Telephone Encounter (Signed)
LM-02/04/22-Searched for PAP for patients Xarelto and Tikosyn.Application completed online on [email or web address] for Xarelto. Searched PAP for Tikosyn and found assistance through [place] but was unable to complete application online. Called pt. And informed him to call [facility] [phone] to inquire assistance for Tikosyn, and informed patient I submitted PAP application for Xarelto through [employer] and provided patient w/ contact number if needed at [phone]. Pt. Verbalized understanding and agreed. Asked pt. If he was contacted by [employer], and the [employer], and pt. Informed me he did not qualify. Asked pt. If he'd like me to search for other available options but pt. Declined. Pt. Stated he has a car now and is able to travel, it is just difficult for him because his car is not reliable. Encouraged patient to reach out if he needs further assistance. Pt. Verbalized understanding and agreed.  Total time spent: 37 min.    LM-01/29/22-Calling [facility] w/ [place]. Was informed pt. Is on waiting list and that it will be months before pt. Will be contacted. Searching for other options.  Will call patient with an update as soon as possible. Searching for transportation assistance. Contacted [place] [employer] to put pt. On list for assistance. Waiting to hear back. Will call patient once I have more information.   Total time spent: 17 min.    LM-01/29/22-Downloaded pts. CP visit notes and uploaded to pts.documents.  Total time spent: 5 min.

## 2022-01-30 ENCOUNTER — Ambulatory Visit: Payer: PPO | Admitting: Rheumatology

## 2022-01-30 DIAGNOSIS — I700 Atherosclerosis of aorta: Secondary | ICD-10-CM

## 2022-01-30 DIAGNOSIS — D6869 Other thrombophilia: Secondary | ICD-10-CM

## 2022-01-30 DIAGNOSIS — Z8709 Personal history of other diseases of the respiratory system: Secondary | ICD-10-CM

## 2022-01-30 DIAGNOSIS — F32A Depression, unspecified: Secondary | ICD-10-CM

## 2022-01-30 DIAGNOSIS — Z227 Latent tuberculosis: Secondary | ICD-10-CM

## 2022-01-30 DIAGNOSIS — R760 Raised antibody titer: Secondary | ICD-10-CM

## 2022-01-30 DIAGNOSIS — F419 Anxiety disorder, unspecified: Secondary | ICD-10-CM

## 2022-01-30 DIAGNOSIS — Z86010 Personal history of colonic polyps: Secondary | ICD-10-CM

## 2022-01-30 DIAGNOSIS — J849 Interstitial pulmonary disease, unspecified: Secondary | ICD-10-CM

## 2022-01-30 DIAGNOSIS — I7300 Raynaud's syndrome without gangrene: Secondary | ICD-10-CM

## 2022-01-30 DIAGNOSIS — E559 Vitamin D deficiency, unspecified: Secondary | ICD-10-CM

## 2022-01-30 DIAGNOSIS — M349 Systemic sclerosis, unspecified: Secondary | ICD-10-CM

## 2022-01-30 DIAGNOSIS — I739 Peripheral vascular disease, unspecified: Secondary | ICD-10-CM

## 2022-01-30 DIAGNOSIS — Z8639 Personal history of other endocrine, nutritional and metabolic disease: Secondary | ICD-10-CM

## 2022-01-30 DIAGNOSIS — I480 Paroxysmal atrial fibrillation: Secondary | ICD-10-CM

## 2022-02-06 ENCOUNTER — Other Ambulatory Visit: Payer: Self-pay

## 2022-02-06 DIAGNOSIS — E782 Mixed hyperlipidemia: Secondary | ICD-10-CM

## 2022-02-06 DIAGNOSIS — I480 Paroxysmal atrial fibrillation: Secondary | ICD-10-CM

## 2022-02-06 MED ORDER — ATORVASTATIN CALCIUM 80 MG PO TABS: ORAL_TABLET | ORAL | 3 refills | Status: DC

## 2022-02-06 MED ORDER — RIVAROXABAN 20 MG PO TABS: ORAL_TABLET | ORAL | 3 refills | Status: DC

## 2022-02-06 NOTE — Telephone Encounter (Signed)
LM-02/06/22-Pt. Called requesting refills on his Xarelto and Atorvastatin prescriptions. Informed pt. Dr. Melford Aase would have to call that in, because they are  prescribed by Dr. Melford Aase. Assisted patient by getting him connected w/ [facility] to request refill. Pt. Verbalized understanding and agreed.   Total time spent: 6 min.

## 2022-02-14 NOTE — Telephone Encounter (Signed)
LM-02/14/22-Called pt. In regards to PAP program. I was emailed by [email or web address] in regards to PAP for Xarelto stating that they have reached out to pt. W/ no response. Called pt. And he stated his Xarelto is less expensive and affordable now. Tried to provide pt. Number in case he needed assistance, but pt. Declined. Asked pt. If he heard anything back from the [employer] for assistance for Tikosyn and pt. Stated he called but never heard back from them. Informed pt. I will reach out to inquire an update for pts. Tikosyn. Informed pt. I would call back when I get an update. Pt. Verbalized understanding and agreed.  Total time spent: 11 min.

## 2022-02-18 ENCOUNTER — Ambulatory Visit: Payer: PPO | Admitting: Pulmonary Disease

## 2022-02-27 DIAGNOSIS — F17200 Nicotine dependence, unspecified, uncomplicated: Secondary | ICD-10-CM | POA: Diagnosis not present

## 2022-02-27 DIAGNOSIS — I4891 Unspecified atrial fibrillation: Secondary | ICD-10-CM | POA: Diagnosis not present

## 2022-02-27 DIAGNOSIS — J449 Chronic obstructive pulmonary disease, unspecified: Secondary | ICD-10-CM | POA: Diagnosis not present

## 2022-04-21 DIAGNOSIS — Z961 Presence of intraocular lens: Secondary | ICD-10-CM | POA: Diagnosis not present

## 2022-04-21 DIAGNOSIS — H40013 Open angle with borderline findings, low risk, bilateral: Secondary | ICD-10-CM | POA: Diagnosis not present

## 2022-04-21 DIAGNOSIS — H35371 Puckering of macula, right eye: Secondary | ICD-10-CM | POA: Diagnosis not present

## 2022-05-19 ENCOUNTER — Encounter: Payer: Self-pay | Admitting: Internal Medicine

## 2022-05-19 NOTE — Patient Instructions (Signed)

## 2022-05-19 NOTE — Progress Notes (Unsigned)
Future Appointments  Date Time Provider Department  05/20/2022 10:30 AM [name], MD [name]  Alycia Rossetti, NP [facility]    History of Present Illness:       This very nice 71 y.o. [name] with HTN, ASHD/pAfib, ASPVD, HLD, Prediabetes,Vitamin D Deficiency, BipolarDisorder /Depression,  Systemic Sclerosis and  Interstitial Pulm Fibrosis presents for 3 month follow up with HTN, HLD, Pre-Diabetes and Vitamin D Deficiency.  CT scan in 2021 showed Aortic Atherosclerosis.        Patient is treated for HTN  since  1996 & BP has been controlled at home. Today's BP kis at goal - 136/76.   In 1996 , patient underwent an ablation for Afib by Dr Caryl Comes.  In 2018, he had PCA  & Arthrotomy of a Rt Common Iliac stenosis w/Stent implanted by Dr [name].  In 2019, he was hospitalized w/Afib ([facility]ADsVASc 4) and started on Tykosyn & Xarelto.    Patient has had no complaints of any cardiac type chest pain, palpitations, dyspnea / orthopnea / PND, dizziness, claudication, or dependent edema.       Hyperlipidemia is controlled with diet & meds. Patient denies myalgias or other med SE's. Last Lipids were  Lab Results  Component Value Date   CHOL 138 11/07/2021   HDL 45 11/07/2021   LDLCALC 74 11/07/2021   TRIG 105 11/07/2021   CHOLHDL 3.1 11/07/2021     Also, the patient has history of PreDiabetes (A1c 5.9% /2017)  and has had no symptoms of reactive hypoglycemia, diabetic polys, paresthesias or visual blurring.  Last A1c was Normal & at goal :  Lab Results  Component Value Date   HGBA1C 5.5 05/08/2021        Further, the patient also has history of Vitamin D Deficiency (A1c 5.9% /2017)  and supplements vitamin D . Last vitamin D was sl low  (goal 70-100) :   Lab Results  Component Value Date   VD25OH 47 11/07/2021     Current Outpatient Medications on File Prior to Visit  Medication Sig   albuterol HFA  inhaler Use  2 inhalations  15 minutes apart  every 4 hours  to rescue Asthma  Attack   ALPRAZolam (XANAX) 1 MG tablet Take 1/2-1 tablet 2 - 3 x /day ONLY if needed    aspirin EC 81 MG tablet Take daily   atorvastatin (LIPITOR) 80 MG tablet Take 1 tablet Daily   Cholecalciferol 10,000 u Take 10,000 Units daily.    dofetilide (TIKOSYN) 500 MCG capsule Take 1 capsule  2 (two) times daily.   pyridOXINE (VITAMIN B-6) 100 MG tablet Take \ daily.   rivaroxaban (XARELTO) 20 MG TABS tablet Take 1 tablet Daily\     Allergies  Allergen Reactions   Cymbalta [Duloxetine Hcl]    Effexor [Venlafaxine]     Feels bad   Wellbutrin [Bupropion]     sleepy   Zoloft [Sertraline Hcl]     Felt bad     PMHx:   Past Medical History:  Diagnosis Date   Anxiety    Atrial fibrillation ([place])    Depression    PVD (peripheral vascular disease) ([place])    Raynaud's syndrome    Systemic sclerosis ([place])    TB lung, latent 12/19/2019   Transaminitis 06/18/2020     Immunization History  Administered Date(s) Administered   Influenza Whole 04/30/2010   Influenza, High Dose Seasonal PF 03/20/2017, 03/29/2018, 04/06/2019, 04/30/2020, 05/08/2021   Moderna Sars-Covid-2 Vaccination 05/12/2020  PFIZER(Purple Top)SARS-COV-2 Vaccination 08/04/2019, 08/29/2019   Pneumococcal Conjugate-13 02/05/2017   Pneumococcal Polysaccharide-23 04/30/2010, 03/29/2018   Td 06/30/2013     Past Surgical History:  Procedure Laterality Date   ATRIAL FIBRILLATION ABLATION     CATARACT EXTRACTION Right    COLONOSCOPY     EYE SURGERY     FRACTURE SURGERY Left 1976   INGUINAL HERNIA REPAIR Right 03/23/2020   Procedure: OPEN RIGHT INGUINAL HERNIA REPAIR WITH MESH;  Surgeon: Johnathan Hausen, MD;  Location: [facility];  Service: General;  Laterality: Right;   INSERTION OF MESH Right 03/23/2020   Procedure: INSERTION OF MESH;  Surgeon: Johnathan Hausen, MD;  Location: [facility];  Service: General;  Laterality: Right;   LOWER EXTREMITY INTERVENTION N/A 04/27/2017   Procedure: LOWER EXTREMITY INTERVENTION;  Surgeon: Lorretta Harp, MD;  Location: [facility];  Service: Cardiovascular;  Laterality: N/A;   PERIPHERAL VASCULAR ATHERECTOMY Right 04/27/2017   Procedure: PERIPHERAL VASCULAR ATHERECTOMY;  Surgeon: Lorretta Harp, MD;  Location: [facility];  Service: Cardiovascular;  Laterality: Right;  Iliac   PERIPHERAL VASCULAR INTERVENTION Right 04/27/2017   Procedure: PERIPHERAL VASCULAR INTERVENTION;  Surgeon: Lorretta Harp, MD;  Location: [facility];  Service: Cardiovascular;  Laterality: Right;  Iliac    FHx:    Reviewed / unchanged  SHx:    Reviewed / unchanged   Systems Review:  Constitutional: Denies fever, chills, wt changes, headaches, insomnia, fatigue, night sweats, change in appetite. Eyes: Denies redness, blurred vision, diplopia, discharge, itchy, watery eyes.  ENT: Denies discharge, congestion, post nasal drip, epistaxis, sore throat, earache, hearing loss, dental pain, tinnitus, vertigo, sinus pain, snoring.  CV: Denies chest pain, palpitations, irregular heartbeat, syncope, dyspnea, diaphoresis, orthopnea, PND, claudication or edema. Respiratory: denies cough, dyspnea, DOE, pleurisy, hoarseness, laryngitis, wheezing.  Gastrointestinal: Denies dysphagia, odynophagia, heartburn, reflux, water brash, abdominal pain or cramps, nausea, vomiting, bloating, diarrhea, constipation, hematemesis, melena, hematochezia  or hemorrhoids. Genitourinary: Denies dysuria, frequency, urgency, nocturia, hesitancy, discharge, hematuria or flank pain. Musculoskeletal: Denies arthralgias, myalgias, stiffness, jt. swelling, pain, limping or strain/sprain.  Skin: Denies pruritus, rash, hives, warts, acne, eczema or change in skin lesion(s). Neuro: No weakness, tremor, incoordination, spasms, paresthesia or pain. Psychiatric: Denies confusion, memory loss or sensory loss. Endo: Denies change in weight, skin or hair change.  Heme/Lymph: No excessive bleeding, bruising or enlarged lymph  nodes.  Physical Exam  BP 136/76   Pulse 100   Temp 97.8 F (36.6 C)   Resp 16   Ht '6\' 1"'$  (1.854 m)   Wt 191 lb (86.6 kg)   SpO2 97%   BMI 25.20 kg/m   Appears  well nourished, well groomed  and in no distress.  Eyes: PERRLA, EOMs, conjunctiva no swelling or erythema. Sinuses: No frontal/maxillary tenderness ENT/Mouth: EAC's clear, TM's nl w/o erythema, bulging. Nares clear w/o erythema, swelling, exudates. Oropharynx clear without erythema or exudates. Oral hygiene is good. Tongue normal, non obstructing. Hearing intact.  Neck: Supple. Thyroid not palpable. Car 2+/2+ without bruits, nodes or JVD. Chest: Respirations nl with BS clear & equal w/o rales, rhonchi, wheezing or stridor.  Cor: Heart sounds normal w/ regular rate and rhythm without sig. murmurs, gallops, clicks or rubs. Peripheral pulses normal and equal  without edema.  Abdomen: Soft & bowel sounds normal. Non-tender w/o guarding, rebound, hernias, masses or organomegaly.  Lymphatics: Unremarkable.  Musculoskeletal: Full ROM all peripheral extremities, joint stability, 5/5 strength and normal gait.  Skin: Warm, dry without exposed  rashes, lesions or ecchymosis apparent.  Neuro: Cranial nerves intact, reflexes equal bilaterally. Sensory-motor testing grossly intact. Tendon reflexes grossly intact.  Pysch: Alert & oriented x 3.  Insight and judgement nl & appropriate. No ideations.  Assessment and Plan:  1. Essential hypertension  - Continue medication, monitor blood pressure at home.  - Continue DASH diet.  Reminder to go to the ER if any CP,  SOB, nausea, dizziness, severe HA, changes vision/speech.   - COMPLETE METABOLIC PANEL WITH GFR - Magnesium - TSH - CBC with Differential/Platelet   2. Hyperlipidemia, mixed  - Continue diet/meds, exercise,& lifestyle modifications.  - Continue monitor periodic cholesterol/liver & renal functions    - Lipid panel - TSH   3. Atrial fibrillation (HCC)  - COMPLETE  METABOLIC PANEL WITH GFR - TSH   4. Abnormal glucose  - Continue diet, exercise  - Lifestyle modifications.  - Monitor appropriate labs   - Hemoglobin A1c - Insulin, random   5. Vitamin D deficiency  - Continue supplementation   - VITAMIN D 25 Hydroxy   6. Atherosclerosis of aorta ([place]) by Chest CT scan on 01/19/2020  - Lipid panel   7. Medication management  - COMPLETE METABOLIC PANEL WITH GFR - Magnesium - Lipid panel - TSH - Hemoglobin A1c - Insulin, random - VITAMIN D 25 Hydroxy - CBC with Differential/Platelet          Discussed  regular exercise, BP monitoring, weight control to achieve/maintain BMI less than 25 and discussed med and SE's. Recommended labs to assess /monitor clinical status .  I discussed the assessment and treatment plan with the patient. The patient was provided an opportunity to ask questions and all were answered. The patient agreed with the plan and demonstrated an understanding of the instructions.  I provided over 30 minutes of exam, counseling, chart review and  complex critical decision making.        The patient was advised to call back or seek an in-person evaluation if the symptoms worsen or if the condition fails to improve as anticipated.   Kirtland Bouchard, MD .

## 2022-05-20 ENCOUNTER — Encounter: Payer: Self-pay | Admitting: Internal Medicine

## 2022-05-20 ENCOUNTER — Ambulatory Visit (INDEPENDENT_AMBULATORY_CARE_PROVIDER_SITE_OTHER): Payer: PPO | Admitting: Internal Medicine

## 2022-05-20 DIAGNOSIS — E559 Vitamin D deficiency, unspecified: Secondary | ICD-10-CM

## 2022-05-20 DIAGNOSIS — I4891 Unspecified atrial fibrillation: Secondary | ICD-10-CM

## 2022-05-20 DIAGNOSIS — I10 Essential (primary) hypertension: Secondary | ICD-10-CM | POA: Diagnosis not present

## 2022-05-20 DIAGNOSIS — R7309 Other abnormal glucose: Secondary | ICD-10-CM

## 2022-05-20 DIAGNOSIS — I480 Paroxysmal atrial fibrillation: Secondary | ICD-10-CM

## 2022-05-20 DIAGNOSIS — I700 Atherosclerosis of aorta: Secondary | ICD-10-CM

## 2022-05-20 DIAGNOSIS — Z79899 Other long term (current) drug therapy: Secondary | ICD-10-CM

## 2022-05-20 DIAGNOSIS — E782 Mixed hyperlipidemia: Secondary | ICD-10-CM

## 2022-05-20 MED ORDER — ATORVASTATIN CALCIUM 80 MG PO TABS: ORAL_TABLET | ORAL | 3 refills | Status: DC

## 2022-05-20 MED ORDER — GABAPENTIN 300 MG PO CAPS: ORAL_CAPSULE | ORAL | 3 refills | Status: DC

## 2022-05-20 MED ORDER — RIVAROXABAN 20 MG PO TABS: ORAL_TABLET | ORAL | 3 refills | Status: DC

## 2022-05-21 LAB — CBC WITH DIFFERENTIAL/PLATELET
Absolute Monocytes: 470 {cells}/uL (ref 200–950)
Basophils Absolute: 73 {cells}/uL (ref 0–200)
Basophils Relative: 0.9 %
Eosinophils Absolute: 470 {cells}/uL (ref 15–500)
Eosinophils Relative: 5.8 %
HCT: 41.9 % (ref 38.5–50.0)
Hemoglobin: 14.2 g/dL (ref 13.2–17.1)
Lymphs Abs: 2446 {cells}/uL (ref 850–3900)
MCH: 31.3 pg (ref 27.0–33.0)
MCHC: 33.9 g/dL (ref 32.0–36.0)
MCV: 92.5 fL (ref 80.0–100.0)
MPV: 11.3 fL (ref 7.5–12.5)
Monocytes Relative: 5.8 %
Neutro Abs: 4641 {cells}/uL (ref 1500–7800)
Neutrophils Relative %: 57.3 %
Platelets: 168 Thousand/uL (ref 140–400)
RBC: 4.53 Million/uL (ref 4.20–5.80)
RDW: 12.9 % (ref 11.0–15.0)
Total Lymphocyte: 30.2 %
WBC: 8.1 Thousand/uL (ref 3.8–10.8)

## 2022-05-21 LAB — COMPLETE METABOLIC PANEL WITH GFR
AG Ratio: 1.6 (calc) (ref 1.0–2.5)
ALT: 20 U/L (ref 9–46)
AST: 23 U/L (ref 10–35)
Albumin: 4.4 g/dL (ref 3.6–5.1)
Alkaline phosphatase (APISO): 71 U/L (ref 35–144)
BUN: 14 mg/dL (ref 7–25)
CO2: 27 mmol/L (ref 20–32)
Calcium: 9.4 mg/dL (ref 8.6–10.3)
Chloride: 104 mmol/L (ref 98–110)
Creat: 1.01 mg/dL (ref 0.70–1.28)
Globulin: 2.8 g/dL (calc) (ref 1.9–3.7)
Glucose, Bld: 184 mg/dL — ABNORMAL HIGH (ref 65–99)
Potassium: 4.3 mmol/L (ref 3.5–5.3)
Sodium: 140 mmol/L (ref 135–146)
Total Bilirubin: 0.6 mg/dL (ref 0.2–1.2)
Total Protein: 7.2 g/dL (ref 6.1–8.1)
eGFR: 80 mL/min/{1.73_m2} (ref >=60)

## 2022-05-21 LAB — INSULIN, RANDOM: Insulin: 152.6 u[IU]/mL — ABNORMAL HIGH

## 2022-05-21 LAB — MAGNESIUM: Magnesium: 2 mg/dL (ref 1.5–2.5)

## 2022-05-21 LAB — HEMOGLOBIN A1C
Hgb A1c MFr Bld: 6 % of total Hgb — ABNORMAL HIGH (ref <5.7)
Mean Plasma Glucose: 126 mg/dL
eAG (mmol/L): 7 mmol/L

## 2022-05-21 LAB — TSH: TSH: 1.63 mIU/L (ref 0.40–4.50)

## 2022-05-21 LAB — LIPID PANEL
Cholesterol: 123 mg/dL (ref <200)
HDL: 44 mg/dL (ref >=40)
LDL Cholesterol (Calc): 53 mg/dL (calc)
Non-HDL Cholesterol (Calc): 79 mg/dL (calc) (ref <130)
Total CHOL/HDL Ratio: 2.8 (calc) (ref <5.0)
Triglycerides: 192 mg/dL — ABNORMAL HIGH (ref <150)

## 2022-05-21 LAB — VITAMIN D 25 HYDROXY (VIT D DEFICIENCY, FRACTURES): Vit D, 25-Hydroxy: 50 ng/mL (ref 30–100)

## 2022-05-23 NOTE — Progress Notes (Signed)
<><><><><><><><><><><><><><><><><><><><><><><><><><><><><><><><><> <><><><><><><><><><><><><><><><><><><><><><><><><><><><><><><><><> - Test results slightly outside the reference range are not unusual. If there is anything important, I will review this with you,  otherwise it is considered normal test values.  If you have further questions,  please do not hesitate to contact me at the office or via My Chart.  <><><><><><><><><><><><><><><><><><><><><><><><><><><><><><><><><> <><><><><><><><><><><><><><><><><><><><><><><><><><><><><><><><><>  -  Glucose = 184 mg% - is Very High  !  ( Ideal or Goal is less than 120 mg% )  A1c is up from 1 year ago Normal 5.5% to now 6.0%                                   (  Ideal or goal is less than 5.7% )   Blood sugar and A1c are elevated in the borderline and  early or pre-diabetes range which has the same   300% increased risk for heart attack, stroke, cancer and   alzheimer- type vascular dementia as full blown diabetes.   But the good news is that diet, exercise with  weight loss can cure the early diabetes at this point. <><><><><><><><><><><><><><><><><><><><><><><><><><><><><><><><><>  -  It is very important that you work harder with diet by                                    avoiding all foods that are white except chicken, fish & calliflower.  - Avoid white rice  (brown & wild rice is OK),   - Avoid white potatoes  (sweet potatoes in moderation is OK),   White bread or wheat bread or anything made out of   white flour like bagels, donuts, rolls, buns, biscuits, cakes,  - pastries, cookies, pizza crust, and pasta (made from white flour & egg whites)   - vegetarian pasta or spinach or wheat pasta is OK.  - Multigrain breads like Arnold's, Pepperidge Farm or   multigrain sandwich thins or high fiber breads like   Eureka bread or "Dave's Killer" breads that are                                                                            4 to 5 grams fiber per slice !  are best.    Diet, exercise and weight loss can reverse and cure                                                                                         diabetes in the early stages.    - Diet, exercise and weight loss is very important in the   control and prevention of complications of diabetes which  affects every system in your body, ie.   -Brain - dementia/stroke,  - eyes - glaucoma/blindness,  - heart - heart attack/heart failure,  - kidneys - dialysis,  - stomach - gastric paralysis,  - intestines - malabsorption,  - nerves - severe painful neuritis,  - circulation - gangrene & loss of a leg(s)  - and finally  . . . . . . . . . . . . . . . . . .    - cancer and Alzheimers. <><><><><><><><><><><><><><><><><><><><><><><><><><><><><><><><><> <><><><><><><><><><><><><><><><><><><><><><><><><><><><><><><><><>  -  Insulin = 152.6 is very high                                    (  Ideal or Goal is less than 20 )                                   &  shows insulin resistance - a sign of early diabetes and                            associated with a 300 % greater risk for heart attacks, strokes,             cancer & Alzheimer type vascular dementia   - All this can be cured  and prevented with losing weight   - get Dr Fara Olden Fuhrman's book 'the End of Diabetes" and "the End of Dieting"    - and add many years of good health to your life. <><><><><><><><><><><><><><><><><><><><><><><><><><><><><><><><><> <><><><><><><><><><><><><><><><><><><><><><><><><><><><><><><><><>  -  Vitamin D = 50 is Low  !   - Vitamin D goal is between 70-100.   - Please make sure that you are taking your Vitamin D 10,000 units /day   as directed.   - It is very important as a natural anti-inflammatory and helping the                                    immune system protect against viral  infections, like the Covid-19    helping hair, skin, and nails, as well as reducing stroke and heart attack risk.   - It helps your bones and helps with mood.  - It also decreases numerous cancer risks so please                                                                                         take it as directed.   - Low Vit D is associated with a 200-300% higher risk for CANCER   and 200-300% higher risk for HEART   ATTACK  &  STROKE.    - It is also associated with higher death rate at younger ages,   autoimmune diseases like Rheumatoid arthritis, Lupus,  Multiple Sclerosis.     - Also many other serious conditions, like depression, Alzheimer's  Dementia, muscle aches, fatigue, fibromyalgia   <><><><><><><><><><><><><><><><><><><><><><><><><><><><><><><><><>  -

## 2022-08-11 ENCOUNTER — Other Ambulatory Visit: Payer: Self-pay | Admitting: Cardiovascular Disease

## 2022-08-25 NOTE — Progress Notes (Signed)
MEDICARE ANNUAL WELLNESS VISIT AND FOLLOW UP Assessment:    [name] was seen today for follow-up and medicare wellness.  Diagnoses and all orders for this visit:  Encounter for Medicare annual wellness exam Due Annually Health Maintenance Reviewed Healthy lifestyle reviewed and goals set  Labile hypertension Continue medication Monitor blood pressure at home; call if consistently over 130/80 Continue DASH diet.   Reminder to go to the ER if any CP, SOB, nausea, dizziness, severe HA, changes vision/speech, left arm numbness and tingling and jaw pain.  Paroxysmal atrial fibrillation (HCC) Rate controlled today, doing well on tikosyn, xarelto  He has been getting his meds refilled by Dr. Allyson Sabal but has not been to see him in since was 10/24/21- strongly encouraged to schedule Appointment Followed by Dr. Allyson Sabal   Systemic scleroses [facility]) Dr. Corliss Skains following;strongly encouraged to schedule appointment , has not been seen x several years He has extensive questions and concerns; discussed at length today, but encouraged him to follow up with Dr. Corliss Skains for more complete discussion of her recommendations, risks/benefits  Raynaud's disease without gangrene Improved recently, continue to monitor   BPH Recently well controlled, denies symptoms  Vitamin D deficiency Continue supplementation- taking 40981 IU daily    TOBACCO ABUSE Discussed risks associated with tobacco use and advised to reduce or quit Continue to smoke 1/2-3/4 ppd Patient is ready to do so and plans to slow taper  Declines Chantix or other medication, has nicotine patches Will follow up at the next visit Continue annual low dose CT lung screening; UTD last one 06/04/21  COPD/emphysema (HCC) Stop smoking, pulm following; given trelegy samples, he will follow up with pulm, consider advair/neb meds if cost prohibitive  Continue to follow with Dr Isaiah Serge Completed handicap placard application for severe shortness of  breath  ILD (HCC) Referred back to Dr. Isaiah Serge for reevaluation Continue inhalers  Abnormal glucose Discussed disease and risks Discussed diet/exercise, weight management  Reminded to schedule appointment with ophthalmology -     Hemoglobin A1c  Hyperlipidemia, mixed Continue medications: on atorvastatin, pending repatha Continue low cholesterol diet and exercise.  Check lipid panel.  -     Lipid panel -     TSH  Bipolar I disorder (HCC) Depression has increased but refuses medication Lifestyle discussed: diet/exerise, sleep hygiene, stress management, hydration Monitor Instructed patient to contact office or on-call physician promptly should condition worsen or any new symptoms appear. IF THE PATIENT HAS ANY SUICIDAL OR HOMICIDAL IDEATIONS, CALL THE OFFICE, DISCUSS WITH A SUPPORT MEMBER, OR GO TO THE ER IMMEDIATELY. Patient was agreeable with this plan.   Claudication in peripheral vascular disease (HCC) Followed by Dr. Allyson Sabal, s/p stent, continued claudication, medical treatment, - pending pletal and repatha but was not started Recommended to quit smoking, regular exercise  Acquired Thrombophilia(HCC) Continue Xarelto daily Monitor for bruising and bleeding  Attention deficit disorder, unspecified hyperactivity presence Currently stable, doing well at current job off of medications  ANXIETY DEPRESSION Well managed by current regimen; continue medications- using benzo sparingly Stress management techniques discussed, increase water, good sleep hygiene discussed, increase exercise, and increase veggies.   BMI 22 Long discussion about weight loss, diet, and exercise Recommended diet heavy in fruits and veggies and low in animal meats, cheeses, and dairy products, appropriate calorie intake Discussed appropriate weight for height Follow up at next visit  Medication management -     CBC with Differential/Platelet -     COMPLETE METABOLIC PANEL WITH GFR -  TSH  Colon  polyps Colonoscopy UTD last 08/01/20, due 2025   Elevated LFTs Last LFT's have been in normal range -CMP  Medication Management Continued   Over 30 minutes of exam, counseling, chart review, and critical decision making was performed  Future Appointments  Date Time Provider Department Center  12/25/2022 11:00 AM [name], MD [facility] None  08/28/2023 11:00 AM [name], NP [facility] None     Plan:   During the course of the visit the patient was educated and counseled about appropriate screening and preventive services including:   Pneumococcal vaccine  Influenza vaccine Prevnar 13 Td vaccine Screening electrocardiogram Colorectal cancer screening Diabetes screening Glaucoma screening Nutrition counseling    Subjective:  Bradley Ferguson is a 72 y.o. male who presents for Medicare Annual Wellness Visit and 3 month follow up for HTN, hyperlipidemia, prediabetes, and vitamin D Def.   He has seen podiatry 05/2020 about wart on bottom of right foot- he did have an insole made. He continues to have pain in the right foot. He still has the callus are on lateral right foot on bottom  He does have hx of mixed bipolar type depression with ADD and anxiety though in recently years has been apparently stable on xanax, recently on no medication.  Patient was recently dx'd by Rheumatology (Dr Corliss Skains) with limited systemic sclerosis with intentions to start Imuran has not followed up with  their office.   High-resolution Chest CT in Jan found basilar fibrotic interstitial lung disease with probable UIP & patient has also been evaluated by Pulmonary by Dr Isaiah Serge.  He never did start the Imuran, last visit with her was 12/18/21 was offered therapy for interstitial lung disease but he declined.  Gets shortness of breath easily.  he currently continues to smoke 1/2-3/4 pack a day, 50 pack year history; discussed risks associated with smoking, patient is not ready to quit,  but slowly tapering down. He had low dose CT lung cancer screening 06/04/21. CT chest high resolution has been ordered by Dr. Isaiah Serge but pt has not had completed.   Colonoscopy 08/01/20 several more polyps removed, next colonoscopy due 2025  BMI is Body mass index is 25.12 kg/m., he has been working on diet, exercise limited by cardiovascular disease (PAD/claudication).  Wt Readings from Last 3 Encounters:  08/26/22 190 lb 6.4 oz (86.4 kg)  05/20/22 191 lb (86.6 kg)  12/18/21 180 lb 3.2 oz (81.7 kg)   Hx/o pAfib s/p ablation in 2011 and was seen in 2019 for CPE and was back in Afib and started on Xarelto, and was admitted for initiation of Tikosyn with successful conversion in Oct 2019. He is followed by Dr. Johney Frame and also Dr. Allyson Sabal for PVD with claudification, peripheral angiography on  04/27/17 revealing a 99% calcified eccentric proximal right common iliac artery stenosis and bilateral long segment SFA chronic total occlusions which were highly calcified. Pt underwent direct back orbital rotational atherectomy, PTCA and cover stenting, however with persistent/recurrent sx, managed medically.   His blood pressure has been controlled at home, today their BP is BP: 128/68  BP Readings from Last 3 Encounters:  08/26/22 128/68  05/20/22 136/76  12/18/21 104/72    He does not workout. He denies chest pain, shortness of breath, dizziness.   He continues to smoke 3/4 ppd. Chest CT scan on 01/19/20  Showed Aortic Atherosclerosis and chronic Interstitial ung disease- 12 month follow up is recommended.  He is on cholesterol medication (atorvastatin 80 mg  daily) and denies myalgias. His cholesterol is not at goal. He was previously evaluated by lipid clinic for consideration of repatha, pending insurance approval at lipid clinic.The cholesterol last visit was:   Lab Results  Component Value Date   CHOL 123 05/20/2022   HDL 44 05/20/2022   LDLCALC 53 05/20/2022   TRIG 192 (H) 05/20/2022   CHOLHDL  2.8 05/20/2022   He has not been working on diet and exercise for prediabetes, and denies increased appetite, nausea, polydipsia, polyuria, visual disturbances, vomiting and weight loss. Last A1C in the office was:  Lab Results  Component Value Date   HGBA1C 6.0 (H) 05/20/2022   Last GFR Lab Results  Component Value Date   EGFR 80 05/20/2022    Patient is on Vitamin D supplement, taking 25366 IU daily    Lab Results  Component Value Date   VD25OH 50 05/20/2022          Medication Review:   Current Outpatient Medications (Cardiovascular):    atorvastatin (LIPITOR) 80 MG tablet, Take 1 tablet Daily for Cholesterol   dofetilide (TIKOSYN) 500 MCG capsule, TAKE ONE CAPSULE BY MOUTH TWICE A DAY  Current Outpatient Medications (Respiratory):    albuterol (VENTOLIN HFA) 108 (90 Base) MCG/ACT inhaler, Use  2 inhalations  15 minutes apart  every 4 hours  to rescue Asthma Attack (Patient not taking: Reported on 08/26/2022)  Current Outpatient Medications (Analgesics):    aspirin EC 81 MG tablet, Take 81 mg by mouth daily. Swallow whole. (Patient not taking: Reported on 08/26/2022)  Current Outpatient Medications (Hematological):    rivaroxaban (XARELTO) 20 MG TABS tablet, Take 1 tablet Daily to Prevent Blood Clots  Current Outpatient Medications (Other):    Cholecalciferol 250 MCG (10000 UT) CAPS, Take 10,000 Units by mouth daily.    gabapentin (NEURONTIN) 300 MG capsule, Take 1 capsule 3 x /day for Neuropathy Pain   pyridOXINE (VITAMIN B-6) 100 MG tablet, Take 100 mg by mouth daily.  Allergies: Allergies  Allergen Reactions   Cymbalta [Duloxetine Hcl]    Effexor [Venlafaxine]     Feels bad   Wellbutrin [Bupropion]     sleepy   Zoloft [Sertraline Hcl]     Felt bad    Current Problems (verified) has ANXIETY DEPRESSION; TOBACCO ABUSE; RAYNAUDS SYNDROME; Attention deficit disorder; Dental caries; Bipolar I disorder (HCC); Atrial fibrillation (HCC); Hyperlipidemia, mixed;  Vitamin D deficiency; Labile hypertension; BPH; Claudication in peripheral vascular disease (HCC); Visit for monitoring Tikosyn therapy; Colon polyps; Acquired thrombophilia (HCC); Atherosclerosis of aorta (HCC) by Chest CT scan on 01/19/2020; Chronic obstructive pulmonary disease (HCC); Centrilobular emphysema (HCC); Interstitial pulmonary disease (HCC); Positive ANA (antinuclear antibody); TB lung, latent; Systemic scleroses (HCC); Right inguinal hernia; Essential hypertension; Corn of foot; Elevated LFTs; and Hepatic steatosis on their problem list.  Screening Tests Immunization History  Administered Date(s) Administered   Influenza Whole 04/30/2010   Influenza, High Dose Seasonal PF 03/20/2017, 03/29/2018, 04/06/2019, 04/30/2020, 05/08/2021   Moderna SARS-COV2 Booster Vaccination 05/12/2020   Moderna Sars-Covid-2 Vaccination 05/12/2020   PFIZER Comirnaty(Gray Top)Covid-19 Tri-Sucrose Vaccine 03/20/2022   PFIZER(Purple Top)SARS-COV-2 Vaccination 08/04/2019, 08/29/2019   Pfizer Covid-19 Vaccine Bivalent Booster 30yrs & up 05/11/2021   Pneumococcal Conjugate-13 02/05/2017   Pneumococcal Polysaccharide-23 04/30/2010, 03/29/2018   Td 06/30/2013    Health Maintenance  Topic Date Due   Lung Cancer Screening  06/04/2022   COVID-19 Vaccine (6 - 2023-24 season) 09/11/2022 (Originally 05/15/2022)   INFLUENZA VACCINE  09/28/2022 (Originally 01/28/2022)   Zoster Vaccines- Shingrix (1  of 2) 11/24/2022 (Originally 08/20/1969)   DTaP/Tdap/Td (2 - Tdap) 07/01/2023   COLONOSCOPY (Pts 45-7yrs Insurance coverage will need to be confirmed)  08/02/2023   Medicare Annual Wellness (AWV)  08/27/2023   Pneumonia Vaccine 74+ Years old  Completed   Hepatitis C Screening  Completed   HPV VACCINES  Aged Out     Names of Other Physician/Practitioners you currently use: 1. [facility] here for primary care 2. Dr. Dione Booze, eye doctor, last visit 2023,   3. Full dentures    Patient Care Team: [name], MD as PCP - General (Internal Medicine) Regan Lemming, MD as PCP - Electrophysiology (Cardiology) Runell Gess, MD as PCP - Cardiology (Cardiology) Runell Gess, MD as Consulting Physician (Cardiology) Hilarie Fredrickson, MD as Consulting Physician (Gastroenterology) Sallye Lat, MD as Consulting Physician (Ophthalmology) Charlett Nose, [facility] (Inactive) as Pharmacist (Pharmacist)  Surgical: He  has a past surgical history that includes Atrial fibrillation ablation; LOWER EXTREMITY INTERVENTION (N/A, 04/27/2017); PERIPHERAL VASCULAR INTERVENTION (Right, 04/27/2017); PERIPHERAL VASCULAR ATHERECTOMY (Right, 04/27/2017); Cataract extraction (Right); Eye surgery; Colonoscopy; Fracture surgery (Left, 1976); Inguinal hernia repair (Right, 03/23/2020); and Insertion of mesh (Right, 03/23/2020). Family His family history includes Cancer in his father and mother. Social history  He reports that he has been smoking cigarettes. He started smoking about 53 years ago. He has a 25.00 pack-year smoking history. He has never used smokeless tobacco. He reports current alcohol use of about 2.0 standard drinks of alcohol per week. He reports that he does not use drugs.  MEDICARE WELLNESS OBJECTIVES: Physical activity: Current Exercise Habits: The patient does not participate in regular exercise at present, Exercise limited by: respiratory conditions(s);orthopedic condition(s);cardiac condition(s) Cardiac risk factors: Cardiac Risk Factors include: advanced age (>27men, >57 women);dyslipidemia;male gender;smoking/ tobacco exposure;sedentary lifestyle Depression/mood screen:      08/26/2022   12:28 PM  Depression screen PHQ 2/9  Decreased Interest 2  Down, Depressed, Hopeless 2  PHQ - 2 Score 4  Altered sleeping 1  Tired, decreased energy 1  Change in appetite 1  Feeling bad or failure about yourself  1  Trouble concentrating 0  Moving slowly or  fidgety/restless 0  Suicidal thoughts 0  PHQ-9 Score 8  Difficult doing work/chores Very difficult    ADLs:     08/26/2022   12:31 PM 05/19/2022   11:05 PM  In your present state of health, do you have any difficulty performing the following activities:  Hearing? 0 0  Vision? 0 0  Difficulty concentrating or making decisions? 0 0  Walking or climbing stairs? 1 0  Dressing or bathing? 0 0  Doing errands, shopping? 1 0     Cognitive Testing  Alert? Yes  Normal Appearance?Yes  Oriented to person? Yes  Place? Yes   Time? Yes  Recall of three objects?  Yes  Can perform simple calculations? Yes  Displays appropriate judgment?Yes  Can read the correct time from a watch face?Yes  EOL planning: Does Patient Have a Medical Advance Directive?: No Would patient like information on creating a medical advance directive?: Yes (MAU/Ambulatory/Procedural Areas - Information given)   Objective:   Today's Vitals   08/26/22 1146  BP: 128/68  Pulse: 88  Temp: 98.1 F (36.7 C)  SpO2: 98%  Weight: 190 lb 6.4 oz (86.4 kg)  Height: 6\' 1"  (1.854 m)      Body mass index is 25.12 kg/m.  General appearance: alert, no distress, WD/WN, male HEENT: normocephalic, sclerae  anicteric, TMs pearly, nares patent, no discharge or erythema, pharynx normal Oral cavity: MMM, no lesions Neck: supple, no lymphadenopathy, no thyromegaly, no masses Heart: RRR, normal S1, S2, no murmurs Lungs: CTA bilaterally, coarse breath sounds and wheezing in lower lobes Abdomen: +bs, soft, non tender, non distended, no masses, no hepatomegaly, no splenomegaly Musculoskeletal: nontender, no swelling, no obvious deformity Extremities: no edema, no cyanosis, no clubbing. R foot with corn to 1st digit base and laterally  Pulses: 2+ symmetric, upper extreamities, LE eripheral pulses thready and without edema. No aortic or femoral bruits. Neurological: alert, oriented x 3, CN2-12 intact, strength normal upper extremities  and lower extremities, sensation normal throughout, DTRs 2+ throughout, no cerebellar signs, gait normal Skin: 1 cm callus on bottom of right foot on lateral side Psychiatric: flat affect, behavior normal, judgement intact  Medicare Attestation I have personally reviewed: The patient's medical and social history Their use of alcohol, tobacco or illicit drugs Their current medications and supplements The patient's functional ability including ADLs,fall risks, home safety risks, cognitive, and hearing and visual impairment Diet and physical activities Evidence for depression or mood disorders  The patient's weight, height, BMI, and visual acuity have been recorded in the chart.  I have made referrals, counseling, and provided education to the patient based on review of the above and I have provided the patient with a written personalized care plan for preventive services.     [name], NP   08/26/2022

## 2022-08-26 ENCOUNTER — Encounter: Payer: Self-pay | Admitting: Nurse Practitioner

## 2022-08-26 ENCOUNTER — Ambulatory Visit (INDEPENDENT_AMBULATORY_CARE_PROVIDER_SITE_OTHER): Payer: PPO | Admitting: Nurse Practitioner

## 2022-08-26 VITALS — BP 128/68 | HR 88 | Temp 98.1°F | Ht 73.0 in | Wt 190.4 lb

## 2022-08-26 DIAGNOSIS — I10 Essential (primary) hypertension: Secondary | ICD-10-CM

## 2022-08-26 DIAGNOSIS — J849 Interstitial pulmonary disease, unspecified: Secondary | ICD-10-CM

## 2022-08-26 DIAGNOSIS — E559 Vitamin D deficiency, unspecified: Secondary | ICD-10-CM

## 2022-08-26 DIAGNOSIS — Z79899 Other long term (current) drug therapy: Secondary | ICD-10-CM | POA: Diagnosis not present

## 2022-08-26 DIAGNOSIS — R7309 Other abnormal glucose: Secondary | ICD-10-CM | POA: Diagnosis not present

## 2022-08-26 DIAGNOSIS — F341 Dysthymic disorder: Secondary | ICD-10-CM

## 2022-08-26 DIAGNOSIS — D6869 Other thrombophilia: Secondary | ICD-10-CM

## 2022-08-26 DIAGNOSIS — F988 Other specified behavioral and emotional disorders with onset usually occurring in childhood and adolescence: Secondary | ICD-10-CM

## 2022-08-26 DIAGNOSIS — M349 Systemic sclerosis, unspecified: Secondary | ICD-10-CM

## 2022-08-26 DIAGNOSIS — D126 Benign neoplasm of colon, unspecified: Secondary | ICD-10-CM

## 2022-08-26 DIAGNOSIS — F17200 Nicotine dependence, unspecified, uncomplicated: Secondary | ICD-10-CM

## 2022-08-26 DIAGNOSIS — F319 Bipolar disorder, unspecified: Secondary | ICD-10-CM | POA: Diagnosis not present

## 2022-08-26 DIAGNOSIS — I700 Atherosclerosis of aorta: Secondary | ICD-10-CM

## 2022-08-26 DIAGNOSIS — I4891 Unspecified atrial fibrillation: Secondary | ICD-10-CM

## 2022-08-26 DIAGNOSIS — I739 Peripheral vascular disease, unspecified: Secondary | ICD-10-CM | POA: Diagnosis not present

## 2022-08-26 DIAGNOSIS — R6889 Other general symptoms and signs: Secondary | ICD-10-CM

## 2022-08-26 DIAGNOSIS — J449 Chronic obstructive pulmonary disease, unspecified: Secondary | ICD-10-CM

## 2022-08-26 DIAGNOSIS — Z0001 Encounter for general adult medical examination with abnormal findings: Secondary | ICD-10-CM

## 2022-08-26 DIAGNOSIS — Z6822 Body mass index (BMI) 22.0-22.9, adult: Secondary | ICD-10-CM

## 2022-08-26 DIAGNOSIS — I7300 Raynaud's syndrome without gangrene: Secondary | ICD-10-CM

## 2022-08-26 DIAGNOSIS — E782 Mixed hyperlipidemia: Secondary | ICD-10-CM | POA: Diagnosis not present

## 2022-08-26 DIAGNOSIS — Z0000 Encounter for general adult medical examination without abnormal findings: Secondary | ICD-10-CM

## 2022-08-26 NOTE — Patient Instructions (Signed)
Need to follow up with Dr. [name]   GENERAL HEALTH GOALS   Know what a healthy weight is for you (roughly BMI <25) and aim to maintain this   Aim for 7+ servings of fruits and vegetables daily   70-80+ fluid ounces of water or unsweet tea for healthy kidneys   Limit to max 1 drink of alcohol per day; avoid smoking/tobacco   Limit animal fats in diet for cholesterol and heart health - choose grass fed whenever available   Avoid highly processed foods, and foods high in saturated/trans fats   Aim for low stress - take time to unwind and care for your mental health   Aim for 150 min of moderate intensity exercise weekly for heart health, and weights twice weekly for bone health   Aim for 7-9 hours of sleep daily

## 2022-08-27 LAB — CBC WITH DIFFERENTIAL/PLATELET
Absolute Monocytes: 669 cells/uL (ref 200–950)
Basophils Absolute: 62 cells/uL (ref 0–200)
Basophils Relative: 0.7 %
Eosinophils Absolute: 273 cells/uL (ref 15–500)
Eosinophils Relative: 3.1 %
HCT: 44.1 % (ref 38.5–50.0)
Hemoglobin: 15 g/dL (ref 13.2–17.1)
Lymphs Abs: 1839 cells/uL (ref 850–3900)
MCH: 30.9 pg (ref 27.0–33.0)
MCHC: 34 g/dL (ref 32.0–36.0)
MCV: 90.7 fL (ref 80.0–100.0)
MPV: 11.7 fL (ref 7.5–12.5)
Monocytes Relative: 7.6 %
Neutro Abs: 5958 cells/uL (ref 1500–7800)
Neutrophils Relative %: 67.7 %
Platelets: 157 10*3/uL (ref 140–400)
RBC: 4.86 10*6/uL (ref 4.20–5.80)
RDW: 13.1 % (ref 11.0–15.0)
Total Lymphocyte: 20.9 %
WBC: 8.8 10*3/uL (ref 3.8–10.8)

## 2022-08-27 LAB — LIPID PANEL
Cholesterol: 134 mg/dL (ref <200)
HDL: 47 mg/dL (ref >=40)
LDL Cholesterol (Calc): 66 mg/dL (calc)
Non-HDL Cholesterol (Calc): 87 mg/dL (calc) (ref <130)
Total CHOL/HDL Ratio: 2.9 (calc) (ref <5.0)
Triglycerides: 130 mg/dL (ref <150)

## 2022-08-27 LAB — COMPLETE METABOLIC PANEL WITH GFR
AG Ratio: 1.6 (calc) (ref 1.0–2.5)
ALT: 19 U/L (ref 9–46)
AST: 21 U/L (ref 10–35)
Albumin: 4.6 g/dL (ref 3.6–5.1)
Alkaline phosphatase (APISO): 80 U/L (ref 35–144)
BUN: 17 mg/dL (ref 7–25)
CO2: 28 mmol/L (ref 20–32)
Calcium: 9.8 mg/dL (ref 8.6–10.3)
Chloride: 103 mmol/L (ref 98–110)
Creat: 0.9 mg/dL (ref 0.70–1.28)
Globulin: 2.8 g/dL (calc) (ref 1.9–3.7)
Glucose, Bld: 91 mg/dL (ref 65–99)
Potassium: 4.5 mmol/L (ref 3.5–5.3)
Sodium: 140 mmol/L (ref 135–146)
Total Bilirubin: 0.7 mg/dL (ref 0.2–1.2)
Total Protein: 7.4 g/dL (ref 6.1–8.1)
eGFR: 91 mL/min/{1.73_m2} (ref >=60)

## 2022-08-27 LAB — TSH: TSH: 2.08 mIU/L (ref 0.40–4.50)

## 2022-11-10 ENCOUNTER — Ambulatory Visit: Payer: PPO | Admitting: Nurse Practitioner

## 2022-12-24 NOTE — Progress Notes (Signed)
Annual  Screening/Preventative Visit  & Comprehensive Evaluation & Examination  Future Appointments  Date Time Provider Department  12/25/2022                      cpe 11:00 AM [name], MD [facility]  08/28/2023                      wellness 11:00 AM [name], NP [facility]  01/05/2024                        cpe 11:00 AM [name], MD [facility]        This very nice 72 y.o. [name]  presents for a Screening /Preventative Visit & comprehensive evaluation and management of multiple medical co-morbidities.  Patient has been followed for HTN, HLD, Prediabetes and Vitamin D Deficiency. Patient also has hx/o BipolarDisorder & Depression - currently in remission. Chest CT scan on 01/20/2019  Showed Aortic Atherosclerosis.       Patient was dx'd by Rheumatology (Dr Corliss Skains) with limited systemic sclerosis with intentions to start Imuran when he has completed 2 months of INH for a positive QuantiFERON test.   A high-resolution  Chest CT in Jan  2022 , found basilar fibrotic interstitial lung disease with probable UIP & patient has also been evaluated by Pulmonary by Dr Isaiah Serge in 2022.       HTN predates since  1996. Patient's BP has been controlled at home. Today's BP is at goal - 116/82. In 2011, patient  underwent an Ablation for pAfib by Dr Graciela Husbands. In 2018, Patient  had PCA, Arthrotomy of a Rt Common Iliac stenosis w/Stent implanted by Dr Allyson Sabal.  In 2019, patient  was hospitalized w/Afib (CHADsVASc 4) and started on Tykosyn & Xarelto.  Patient continues f/u with Dr Allyson Sabal.   Patient denies any cardiac symptoms as chest pain, palpitations, shortness of breath, dizziness or ankle swelling.      Patient's hyperlipidemia is controlled with diet and Atorvastatin. Patient denies myalgias or other medication SE's. Last lipids were at goal:  Lab Results  Component Value Date   CHOL 129 09/29/2019   HDL 43 09/29/2019   LDLCALC 63 09/29/2019   TRIG 146 09/29/2019   CHOLHDL 3.0  09/29/2019       Patient has prediabetes (A1c 5.9% /2017) and patient denies reactive hypoglycemic symptoms, visual blurring, diabetic polys or paresthesias. Last A1c was near goal:  Lab Results  Component Value Date   HGBA1C 5.7 (H) 09/29/2019        Finally, patient has history of Vitamin D Deficiency ("28" /2017) and last vitamin D was near goal:  Lab Results  Component Value Date   VD25OH 75 05/20/2022    Current Outpatient Medications  Medication Instructions   Albuterol  HFA inhaler Use  2 inhalations  every 4 hours  to rescue Asthma    aspirin EC   81 mg Daily   atorvastatin   80 MG  Take 1 tablet Daily    Cholecalciferol    10,000 Units Daily   TIKOSYN  500 mcg 2 times daily   gabapentin  300 MG  Take 1 capsule 3 x /day    pyridOXINE (VIT  B6)   100 mg Daily   XARELTO 20 MG  Take 1 tablet Daily      Allergies  Allergen Reactions   Effexor [Venlafaxine]     Feels bad  Wellbutrin [Bupropion]     sleepy   Zoloft [Sertraline Hcl]     Felt bad   Past Medical History:  Diagnosis Date   Anxiety    Atrial fibrillation (HCC)    Blood transfusion without reported diagnosis 1976   Cataract    Depression    Dyspnea    climbing stairs or walking "in big store- I get short of breathe."   Dysrhythmia    Emphysema of lung (HCC)    Heart murmur    Hyperlipidemia    ILD (interstitial lung disease) (HCC)    PVD (peripheral vascular disease) (HCC)    Raynaud's syndrome    Systemic sclerosis (HCC)    TB lung, latent 12/19/2019   Health Maintenance  Topic Date Due   COLONOSCOPY  07/24/2019   INFLUENZA VACCINE  01/29/2020   TETANUS/TDAP  07/01/2023   COVID-19 Vaccine  Completed   Hepatitis C Screening  Completed   PNA vac Low Risk Adult  Completed   Immunization History  Administered Date(s) Administered   Influenza Whole 04/30/2010   Influenza, High Dose  03/20/2017, 03/29/2018, 04/06/2019   PFIZER SARS-COV-2 Vacc 08/04/2019, 08/29/2019   Pneumococcal -13  02/05/2017   Pneumococcal -23 04/30/2010, 03/29/2018   Td 06/30/2013   Last Colon - 07/2020 - Dr Marina Goodell - Recommended f/u Colon in 3 years - due Feb 2025  Past Surgical History:  Procedure Laterality Date   ATRIAL FIBRILLATION ABLATION     CATARACT EXTRACTION Right    COLONOSCOPY     EYE SURGERY     FRACTURE SURGERY Left 1976   INGUINAL HERNIA REPAIR Right 03/23/2020   Procedure: OPEN RIGHT INGUINAL HERNIA REPAIR WITH MESH;  Surgeon: Luretha Murphy, MD;  Location: [facility] OR;  Service: General;  Laterality: Right;   INSERTION OF MESH Right 03/23/2020   Procedure: INSERTION OF MESH;  Surgeon: Luretha Murphy, MD;  Location: [facility] OR;  Service: General;  Laterality: Right;   LOWER EXTREMITY INTERVENTION N/A 04/27/2017   Procedure: LOWER EXTREMITY INTERVENTION;  Surgeon: Runell Gess, MD;  Location: [facility] INVASIVE CV LAB;  Service: Cardiovascular;  Laterality: N/A;   PERIPHERAL VASCULAR ATHERECTOMY Right 04/27/2017   Procedure: PERIPHERAL VASCULAR ATHERECTOMY;  Surgeon: Runell Gess, MD;  Location: [facility] INVASIVE CV LAB;  Service: Cardiovascular;  Laterality: Right;  Iliac   PERIPHERAL VASCULAR INTERVENTION Right 04/27/2017   Procedure: PERIPHERAL VASCULAR INTERVENTION;  Surgeon: Runell Gess, MD;  Location: [facility] INVASIVE CV LAB;  Service: Cardiovascular;  Laterality: Right;  Iliac   Family History  Problem Relation Age of Onset   Cancer Mother        small cell lung cancer   Cancer Father        unsure type   Colon cancer Neg Hx    Rectal cancer Neg Hx    Social History   Socioeconomic History   Marital status: Divorced   Number of children: 1  Occupational History   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 Does c/o fatigue. Eyes: Denies redness, blurred vision, diplopia, discharge, itchy or watery eyes.  ENT: Denies discharge, congestion, post nasal drip, epistaxis, sore throat, earache, hearing loss, dental pain, Tinnitus, Vertigo, Sinus pain or snoring.  Cardio: Denies chest pain, palpitations, irregular heartbeat, syncope, dyspnea, diaphoresis, orthopnea, PND, claudication or edema Respiratory: denies cough, dyspnea, DOE, pleurisy, hoarseness, laryngitis or wheezing.  Gastrointestinal: Denies dysphagia, heartburn, reflux, water brash, pain, cramps, nausea, vomiting, bloating, diarrhea, constipation, hematemesis, melena, hematochezia, jaundice or hemorrhoids Genitourinary: Denies dysuria, frequency, urgency, nocturia, hesitancy, discharge, hematuria or flank pain Musculoskeletal: Denies arthralgia, myalgia, stiffness, Jt. Swelling, pain, limp or strain/sprain. Denies Falls. Skin: Denies puritis, rash, hives, warts, acne, eczema or change in skin lesion Neuro: No weakness, tremor, incoordination, spasms, paresthesia or pain Psychiatric: Denies confusion, memory loss or sensory loss. Denies Depression. Endocrine: Denies change in weight, skin, hair change, nocturia, and paresthesia, diabetic polys, visual blurring or hyper / hypo glycemic episodes.  Heme/Lymph: No excessive bleeding, bruising or enlarged lymph nodes.  Physical Exam  There were no vitals taken for this visit.  General Appearance: Well nourished and well groomed and in no apparent distress.  Eyes: PERRLA, EOMs, conjunctiva no swelling or erythema, normal fundi and vessels. Sinuses: No frontal/maxillary  tenderness ENT/Mouth: EACs patent / TMs  nl. Nares clear without erythema, swelling, mucoid exudates. Oral hygiene is good. No erythema, swelling, or exudate. Tongue normal, non-obstructing. Tonsils not swollen or erythematous. Hearing normal.  Neck: Supple, thyroid not palpable. No bruits, nodes or JVD. Respiratory: Respiratory effort normal.  BS equal and clear bilateral without rales, rhonci, wheezing or stridor. Cardio: Heart sounds are normal with regular rate and rhythm and no murmurs, rubs or gallops. Peripheral pulses are normal and equal bilaterally without edema. No aortic or femoral bruits. Chest: symmetric with normal excursions and percussion.  Abdomen: Soft, with Nl bowel sounds. Nontender, no guarding, rebound, hernias, masses, or organomegaly.  Lymphatics: Non tender without lymphadenopathy.  Musculoskeletal: Full ROM all peripheral extremities, joint stability, 5/5 strength, and normal gait. Skin: Warm and dry without rashes, lesions, cyanosis, clubbing or  ecchymosis.  Neuro: Cranial nerves intact, reflexes equal bilaterally. Normal muscle tone, no cerebellar symptoms. Sensation intact.  Pysch: Alert and oriented X 3 with normal affect, insight and judgment appropriate.   Assessment and Plan  1. Annual Preventative/Screening Exam    2. Essential hypertension  - EKG 12-Lead - [place], RETROPERITNL ABD,  LTD - Urinalysis, Routine w reflex microscopic - Microalbumin / creatinine urine ratio - CBC with Differential/Platelet - COMPLETE METABOLIC PANEL WITH GFR - Magnesium - TSH  3. Hyperlipidemia, mixed  - EKG 12-Lead - [place], RETROPERITNL ABD,  LTD - Lipid panel - TSH  4. Abnormal glucose  - EKG 12-Lead - [place], RETROPERITNL ABD,  LTD - Hemoglobin A1c - Insulin, random  5. Vitamin D deficiency  - VITAMIN D 25 Hydroxy  6. Paroxysmal atrial fibrillation (HCC)  - EKG 12-Lead - TSH  7. Aortic atherosclerosis (HCC)  - EKG 12-Lead - [place], RETROPERITNL ABD,  LTD -  Lipid panel  8. Acquired thrombophilia (HCC)  - CBC with Differential/Platelet  9. Prediabetes  - EKG 12-Lead - [place], RETROPERITNL ABD,  LTD - Hemoglobin A1c - Insulin, random  10. Chronic obstructive pulmonary disease(HCC)   11. Interstitial pulmonary disease (HCC)   12. BPH with  obstruction/lower urinary tract symptoms  - PSA  13. TB lung, latent   14. Screening for colorectal cancer  - POC Hemoccult Bld/Stl  15. Prostate cancer screening  - PSA  16. Screening for ischemic heart disease  - EKG 12-Lead  17. Smoker  - EKG 12-Lead - [place], RETROPERITNL ABD,  LTD  18. Screening for AAA (aortic abdominal aneurysm)  - [place], RETROPERITNL ABD,  LTD  19. Medication management  - Urinalysis, Routine w reflex microscopic - Microalbumin / creatinine urine ratio - CBC with Differential/Platelet - COMPLETE METABOLIC PANEL WITH GFR - Magnesium - Lipid panel - TSH - Hemoglobin A1c - Insulin, random - VITAMIN D 25 Hydroxy         Patient was counseled in prudent diet, weight control to achieve/maintain BMI less than 25, BP monitoring, regular exercise and medications as discussed.  Discussed med effects and SE's. Routine screening labs and tests as requested with regular follow-up as recommended. Over 40 minutes of exam, counseling, chart review and high complex critical decision making was performed   Marinus Maw, MD

## 2022-12-24 NOTE — Progress Notes (Incomplete)
Annual  Screening/Preventative Visit  & Comprehensive Evaluation & Examination  Future Appointments  Date Time Provider Department  12/25/2022                      cpe 11:00 AM Bradley Cowboy, MD [facility]  08/28/2023                      wellness 11:00 AM Bradley Dick, NP [facility]  01/05/2024                        cpe 11:00 AM Bradley Cowboy, MD [facility]        This very nice 72 y.o. [name]  presents for a Screening /Preventative Visit & comprehensive evaluation and management of multiple medical co-morbidities.  Patient has been followed for HTN, HLD, Prediabetes and Vitamin D Deficiency. Patient also has hx/o BipolarDisorder & Depression - currently in remission. Chest CT scan on 01/20/2019  Showed Aortic Atherosclerosis.       Patient was dx'd by Rheumatology (Dr Corliss Skains) with limited systemic sclerosis with intentions to start Imuran when he has completed 2 months of INH for a positive QuantiFERON test.  high-resolution  Chest CT in Jan found basilar fibrotic interstitial lung disease with probable UIP & patient has also been evaluated by Pulmonary by Dr Isaiah Serge.      HTN predates since  1996. Patient's BP has been controlled at home. Today's BP is at goal - 116/82. In 2011, patient  underwent an Ablation for pAfib by Dr Graciela Husbands. In 2018, Patient  had PCA, Arthrotomy of a Rt Common Iliac stenosis w/Stent implanted by Dr Allyson Sabal.  In 2019, patient  was hospitalized w/Afib (CHADsVASc 4) and started on Tykosyn & Xarelto.  Patient continues f/u with Dr Allyson Sabal.   Patient denies any cardiac symptoms as chest pain, palpitations, shortness of breath, dizziness or ankle swelling.      Patient's hyperlipidemia is controlled with diet and Atorvastatin. Patient denies myalgias or other medication SE's. Last lipids were at goal:  Lab Results  Component Value Date   CHOL 129 09/29/2019   HDL 43 09/29/2019   LDLCALC 63 09/29/2019   TRIG 146 09/29/2019   CHOLHDL 3.0 09/29/2019        Patient has prediabetes (A1c 5.9% /2017) and patient denies reactive hypoglycemic symptoms, visual blurring, diabetic polys or paresthesias. Last A1c was near goal:  Lab Results  Component Value Date   HGBA1C 5.7 (H) 09/29/2019        Finally, patient has history of Vitamin D Deficiency ("28" /2017) and last vitamin D was near goal:  Lab Results  Component Value Date   VD25OH 100 05/20/2022    Current Outpatient Medications on File Prior to Visit  Medication Sig  . ALPRAZolam  1 MG tablet Take 1 mg  daily as needed for anxiety.   Marland Kitchen aspirin EC 81 MG tablet Take  daily  . atorvastatin  80 MG tablet Take 1 tablet Daily for Cholesterol  . Cholecalciferol 10,000 U  Take 10,000 Units  daily.   Marland Kitchen TIKOSYN 500 MCG capsule TAKE ONE CAPSULE  TWO TIMES DAILY   . DULoxetine  30 MG capsule Take    1 capsule    Daily     for Depression  . NORCO 5-325 MG t Take 1 tablet every 6 (hours as needed for moderate pain.  Marland Kitchen isoniazid  300 MG tablet Take 300 mg by  mouth daily.  Marland Kitchen NIZORAL 2 % cream  Apply 1 application topically daily as needed (Rash)  . VITAMIN B-6  100 MG tablet Take 100 mg by mouth daily.  Carlena Hurl 20 MG TABS tablet Take 1 tablet Daily to Prevent Blood Clots   . triamcinolone cream 0.5 % Apply 1 application topically daily as needed (rash). )    Allergies  Allergen Reactions  . Effexor [Venlafaxine]     Feels bad  . Wellbutrin [Bupropion]     sleepy  . Zoloft [Sertraline Hcl]     Felt bad   Past Medical History:  Diagnosis Date  . Anxiety   . Atrial fibrillation (HCC)   . Blood transfusion without reported diagnosis 1976  . Cataract   . Depression   . Dyspnea    climbing stairs or walking "in big store- I get short of breathe."  . Dysrhythmia   . Emphysema of lung (HCC)   . Heart murmur   . Hyperlipidemia   . ILD (interstitial lung disease) (HCC)   . PVD (peripheral vascular disease) (HCC)   . Raynaud's syndrome   . Systemic sclerosis (HCC)   . TB lung,  latent 12/19/2019   Health Maintenance  Topic Date Due  . COLONOSCOPY  07/24/2019  . INFLUENZA VACCINE  01/29/2020  . TETANUS/TDAP  07/01/2023  . COVID-19 Vaccine  Completed  . Hepatitis C Screening  Completed  . PNA vac Low Risk Adult  Completed   Immunization History  Administered Date(s) Administered  . Influenza Whole 04/30/2010  . Influenza, High Dose Seasonal PF 03/20/2017, 03/29/2018, 04/06/2019  . PFIZER SARS-COV-2 Vaccination 08/04/2019, 08/29/2019  . Pneumococcal Conjugate-13 02/05/2017  . Pneumococcal Polysaccharide-23 04/30/2010, 03/29/2018  . Td 06/30/2013   Last Colon - 07/23/2018 - Dr Marina Goodell - #20 polyps thru-out the colon - Recommended f/u Colon in 6 months. (overdue)  Past Surgical History:  Procedure Laterality Date  . ATRIAL FIBRILLATION ABLATION    . CATARACT EXTRACTION Right   . COLONOSCOPY    . EYE SURGERY    . FRACTURE SURGERY Left 1976  . INGUINAL HERNIA REPAIR Right 03/23/2020   Procedure: OPEN RIGHT INGUINAL HERNIA REPAIR WITH MESH;  Surgeon: Luretha Murphy, MD;  Location: [facility] OR;  Service: General;  Laterality: Right;  . INSERTION OF MESH Right 03/23/2020   Procedure: INSERTION OF MESH;  Surgeon: Luretha Murphy, MD;  Location: [facility] OR;  Service: General;  Laterality: Right;  . LOWER EXTREMITY INTERVENTION N/A 04/27/2017   Procedure: LOWER EXTREMITY INTERVENTION;  Surgeon: Runell Gess, MD;  Location: [facility] INVASIVE CV LAB;  Service: Cardiovascular;  Laterality: N/A;  . PERIPHERAL VASCULAR ATHERECTOMY Right 04/27/2017   Procedure: PERIPHERAL VASCULAR ATHERECTOMY;  Surgeon: Runell Gess, MD;  Location: [facility] INVASIVE CV LAB;  Service: Cardiovascular;  Laterality: Right;  Iliac  . PERIPHERAL VASCULAR INTERVENTION Right 04/27/2017   Procedure: PERIPHERAL VASCULAR INTERVENTION;  Surgeon: Runell Gess, MD;  Location: [facility]AS[facility] LAB;  Service: Cardiovascular;  Laterality: Right;  Iliac   Family History  Problem Relation Age of Onset  . Cancer  Mother        small cell lung cancer  . Cancer Father        unsure type  . Colon cancer Neg Hx   . Rectal cancer Neg Hx    Social History   Socioeconomic History  . Marital status: Divorced  . Number of children: 1  Occupational History  . 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  Smoking status: Current Every Day Smoker    Packs/day: 0.50    Years: 50.00    Pack years: 25.00    Types: Cigarettes    Start date: 50  . Smokeless tobacco: Never Used  . Tobacco comment: 1/2 pack per day 02/29/20 [facility]  Vaping Use  . Vaping Use: Never used  Substance and Sexual Activity  . Alcohol use: Yes    Alcohol/week: 2.0 standard drinks    Types: 2 Standard drinks or equivalent per week  . Drug use: Never  . Sexual activity: Not on file    ROS Constitutional: Denies fever, chills, weight loss/gain, headaches, insomnia,  night sweats or change in appetite. Does c/o fatigue. Eyes: Denies redness, blurred vision, diplopia, discharge, itchy or watery eyes.  ENT: Denies discharge, congestion, post nasal drip, epistaxis, sore throat, earache, hearing loss, dental pain, Tinnitus, Vertigo, Sinus pain or snoring.  Cardio: Denies chest pain, palpitations, irregular heartbeat, syncope, dyspnea, diaphoresis, orthopnea, PND, claudication or edema Respiratory: denies cough, dyspnea, DOE, pleurisy, hoarseness, laryngitis or wheezing.  Gastrointestinal: Denies dysphagia, heartburn, reflux, water brash, pain, cramps, nausea, vomiting, bloating, diarrhea, constipation, hematemesis, melena, hematochezia, jaundice or hemorrhoids Genitourinary: Denies dysuria, frequency, urgency, nocturia, hesitancy, discharge, hematuria or flank pain Musculoskeletal: Denies arthralgia, myalgia, stiffness, Jt. Swelling, pain, limp or strain/sprain. Denies Falls. Skin: Denies puritis, rash, hives, warts, acne, eczema or change in skin lesion Neuro: No weakness, tremor, incoordination, spasms, paresthesia or  pain Psychiatric: Denies confusion, memory loss or sensory loss. Denies Depression. Endocrine: Denies change in weight, skin, hair change, nocturia, and paresthesia, diabetic polys, visual blurring or hyper / hypo glycemic episodes.  Heme/Lymph: No excessive bleeding, bruising or enlarged lymph nodes.  Physical Exam  There were no vitals taken for this visit.  General Appearance: Well nourished and well groomed and in no apparent distress.  Eyes: PERRLA, EOMs, conjunctiva no swelling or erythema, normal fundi and vessels. Sinuses: No frontal/maxillary tenderness ENT/Mouth: EACs patent / TMs  nl. Nares clear without erythema, swelling, mucoid exudates. Oral hygiene is good. No erythema, swelling, or exudate. Tongue normal, non-obstructing. Tonsils not swollen or erythematous. Hearing normal.  Neck: Supple, thyroid not palpable. No bruits, nodes or JVD. Respiratory: Respiratory effort normal.  BS equal and clear bilateral without rales, rhonci, wheezing or stridor. Cardio: Heart sounds are normal with regular rate and rhythm and no murmurs, rubs or gallops. Peripheral pulses are normal and equal bilaterally without edema. No aortic or femoral bruits. Chest: symmetric with normal excursions and percussion.  Abdomen: Soft, with Nl bowel sounds. Nontender, no guarding, rebound, hernias, masses, or organomegaly.  Lymphatics: Non tender without lymphadenopathy.  Musculoskeletal: Full ROM all peripheral extremities, joint stability, 5/5 strength, and normal gait. Skin: Warm and dry without rashes, lesions, cyanosis, clubbing or  ecchymosis.  Neuro: Cranial nerves intact, reflexes equal bilaterally. Normal muscle tone, no cerebellar symptoms. Sensation intact.  Pysch: Alert and oriented X 3 with normal affect, insight and judgment appropriate.   Assessment and Plan  1. Annual Preventative/Screening Exam    2. Essential hypertension  - EKG 12-Lead - [place], RETROPERITNL ABD,  LTD - Urinalysis,  Routine w reflex microscopic - Microalbumin / creatinine urine ratio - CBC with Differential/Platelet - COMPLETE METABOLIC PANEL WITH GFR - Magnesium - TSH  3. Hyperlipidemia, mixed  - EKG 12-Lead - [place], RETROPERITNL ABD,  LTD - Lipid panel - TSH  4. Abnormal glucose  - EKG 12-Lead - [place], RETROPERITNL ABD,  LTD - Hemoglobin A1c - Insulin, random  5. Vitamin  D deficiency  - VITAMIN D 25 Hydroxy  6. Paroxysmal atrial fibrillation (HCC)  - EKG 12-Lead - TSH  7. Aortic atherosclerosis (HCC)  - EKG 12-Lead - [place], RETROPERITNL ABD,  LTD - Lipid panel  8. Acquired thrombophilia (HCC)  - CBC with Differential/Platelet  9. Prediabetes  - EKG 12-Lead - [place], RETROPERITNL ABD,  LTD - Hemoglobin A1c - Insulin, random  10. Chronic obstructive pulmonary disease(HCC)   11. Interstitial pulmonary disease (HCC)   12. BPH with obstruction/lower urinary tract symptoms  - PSA  13. TB lung, latent   14. Screening for colorectal cancer  - POC Hemoccult Bld/Stl  15. Prostate cancer screening  - PSA  16. Screening for ischemic heart disease  - EKG 12-Lead  17. Smoker  - EKG 12-Lead - [place], RETROPERITNL ABD,  LTD  18. Screening for AAA (aortic abdominal aneurysm)  - [place], RETROPERITNL ABD,  LTD  19. Medication management  - Urinalysis, Routine w reflex microscopic - Microalbumin / creatinine urine ratio - CBC with Differential/Platelet - COMPLETE METABOLIC PANEL WITH GFR - Magnesium - Lipid panel - TSH - Hemoglobin A1c - Insulin, random - VITAMIN D 25 Hydroxy         Patient was counseled in prudent diet, weight control to achieve/maintain BMI less than 25, BP monitoring, regular exercise and medications as discussed.  Discussed med effects and SE's. Routine screening labs and tests as requested with regular follow-up as recommended. Over 40 minutes of exam, counseling, chart review and high complex critical decision making was performed   Marinus Maw, MD

## 2022-12-25 ENCOUNTER — Ambulatory Visit (INDEPENDENT_AMBULATORY_CARE_PROVIDER_SITE_OTHER): Payer: PPO | Admitting: Internal Medicine

## 2022-12-25 ENCOUNTER — Encounter: Payer: Self-pay | Admitting: Internal Medicine

## 2022-12-25 VITALS — BP 128/78 | HR 86 | Temp 97.9°F | Resp 17 | Ht 73.0 in | Wt 182.6 lb

## 2022-12-25 DIAGNOSIS — I10 Essential (primary) hypertension: Secondary | ICD-10-CM | POA: Diagnosis not present

## 2022-12-25 DIAGNOSIS — Z79899 Other long term (current) drug therapy: Secondary | ICD-10-CM | POA: Diagnosis not present

## 2022-12-25 DIAGNOSIS — I480 Paroxysmal atrial fibrillation: Secondary | ICD-10-CM | POA: Diagnosis not present

## 2022-12-25 DIAGNOSIS — Z125 Encounter for screening for malignant neoplasm of prostate: Secondary | ICD-10-CM | POA: Diagnosis not present

## 2022-12-25 DIAGNOSIS — N401 Enlarged prostate with lower urinary tract symptoms: Secondary | ICD-10-CM | POA: Diagnosis not present

## 2022-12-25 DIAGNOSIS — D6869 Other thrombophilia: Secondary | ICD-10-CM | POA: Diagnosis not present

## 2022-12-25 DIAGNOSIS — Z1211 Encounter for screening for malignant neoplasm of colon: Secondary | ICD-10-CM

## 2022-12-25 DIAGNOSIS — F17200 Nicotine dependence, unspecified, uncomplicated: Secondary | ICD-10-CM

## 2022-12-25 DIAGNOSIS — J849 Interstitial pulmonary disease, unspecified: Secondary | ICD-10-CM

## 2022-12-25 DIAGNOSIS — Z136 Encounter for screening for cardiovascular disorders: Secondary | ICD-10-CM | POA: Diagnosis not present

## 2022-12-25 DIAGNOSIS — Z0001 Encounter for general adult medical examination with abnormal findings: Secondary | ICD-10-CM

## 2022-12-25 DIAGNOSIS — N138 Other obstructive and reflux uropathy: Secondary | ICD-10-CM | POA: Diagnosis not present

## 2022-12-25 DIAGNOSIS — E782 Mixed hyperlipidemia: Secondary | ICD-10-CM | POA: Diagnosis not present

## 2022-12-25 DIAGNOSIS — E559 Vitamin D deficiency, unspecified: Secondary | ICD-10-CM

## 2022-12-25 DIAGNOSIS — I700 Atherosclerosis of aorta: Secondary | ICD-10-CM | POA: Diagnosis not present

## 2022-12-25 DIAGNOSIS — Z0000 Encounter for general adult medical examination without abnormal findings: Secondary | ICD-10-CM

## 2022-12-25 DIAGNOSIS — R7303 Prediabetes: Secondary | ICD-10-CM

## 2022-12-25 DIAGNOSIS — R7309 Other abnormal glucose: Secondary | ICD-10-CM | POA: Diagnosis not present

## 2022-12-25 DIAGNOSIS — J449 Chronic obstructive pulmonary disease, unspecified: Secondary | ICD-10-CM

## 2022-12-25 NOTE — Patient Instructions (Signed)

## 2022-12-26 LAB — URINALYSIS, ROUTINE W REFLEX MICROSCOPIC
Bacteria, UA: NONE SEEN /HPF
Bilirubin Urine: NEGATIVE
Glucose, UA: NEGATIVE
Hgb urine dipstick: NEGATIVE
Hyaline Cast: NONE SEEN /LPF
Leukocytes,Ua: NEGATIVE
Nitrite: NEGATIVE
RBC / HPF: NONE SEEN /HPF (ref 0–2)
Specific Gravity, Urine: 1.024 (ref 1.001–1.035)
Squamous Epithelial / HPF: NONE SEEN /HPF (ref <=5)
pH: 5.5 (ref 5.0–8.0)

## 2022-12-26 LAB — CBC WITH DIFFERENTIAL/PLATELET
Absolute Monocytes: 476 cells/uL (ref 200–950)
Basophils Absolute: 68 cells/uL (ref 0–200)
Basophils Relative: 1 %
Eosinophils Absolute: 524 cells/uL — ABNORMAL HIGH (ref 15–500)
Eosinophils Relative: 7.7 %
HCT: 40.1 % (ref 38.5–50.0)
Hemoglobin: 13.5 g/dL (ref 13.2–17.1)
Lymphs Abs: 2271 cells/uL (ref 850–3900)
MCH: 31 pg (ref 27.0–33.0)
MCHC: 33.7 g/dL (ref 32.0–36.0)
MCV: 92 fL (ref 80.0–100.0)
MPV: 11.8 fL (ref 7.5–12.5)
Monocytes Relative: 7 %
Neutro Abs: 3461 cells/uL (ref 1500–7800)
Neutrophils Relative %: 50.9 %
Platelets: 134 10*3/uL — ABNORMAL LOW (ref 140–400)
RBC: 4.36 10*6/uL (ref 4.20–5.80)
RDW: 13.4 % (ref 11.0–15.0)
Total Lymphocyte: 33.4 %
WBC: 6.8 10*3/uL (ref 3.8–10.8)

## 2022-12-26 LAB — LIPID PANEL
Cholesterol: 121 mg/dL (ref <200)
HDL: 36 mg/dL — ABNORMAL LOW (ref >=40)
LDL Cholesterol (Calc): 62 mg/dL (calc)
Non-HDL Cholesterol (Calc): 85 mg/dL (calc) (ref <130)
Total CHOL/HDL Ratio: 3.4 (calc) (ref <5.0)
Triglycerides: 146 mg/dL (ref <150)

## 2022-12-26 LAB — COMPLETE METABOLIC PANEL WITH GFR
AG Ratio: 1.8 (calc) (ref 1.0–2.5)
ALT: 13 U/L (ref 9–46)
AST: 20 U/L (ref 10–35)
Albumin: 4.2 g/dL (ref 3.6–5.1)
Alkaline phosphatase (APISO): 66 U/L (ref 35–144)
BUN: 17 mg/dL (ref 7–25)
CO2: 24 mmol/L (ref 20–32)
Calcium: 9.3 mg/dL (ref 8.6–10.3)
Chloride: 106 mmol/L (ref 98–110)
Creat: 1.03 mg/dL (ref 0.70–1.28)
Globulin: 2.3 g/dL (calc) (ref 1.9–3.7)
Glucose, Bld: 158 mg/dL — ABNORMAL HIGH (ref 65–99)
Potassium: 4.3 mmol/L (ref 3.5–5.3)
Sodium: 141 mmol/L (ref 135–146)
Total Bilirubin: 0.8 mg/dL (ref 0.2–1.2)
Total Protein: 6.5 g/dL (ref 6.1–8.1)
eGFR: 78 mL/min/{1.73_m2} (ref >=60)

## 2022-12-26 LAB — MICROALBUMIN / CREATININE URINE RATIO
Creatinine, Urine: 241 mg/dL (ref 20–320)
Microalb Creat Ratio: 4 mg/g creat (ref <30)
Microalb, Ur: 0.9 mg/dL

## 2022-12-26 LAB — MAGNESIUM: Magnesium: 2 mg/dL (ref 1.5–2.5)

## 2022-12-26 LAB — HEMOGLOBIN A1C
Hgb A1c MFr Bld: 6 % of total Hgb — ABNORMAL HIGH (ref <5.7)
Mean Plasma Glucose: 126 mg/dL
eAG (mmol/L): 7 mmol/L

## 2022-12-26 LAB — TSH: TSH: 1.68 mIU/L (ref 0.40–4.50)

## 2022-12-26 LAB — PSA: PSA: 0.57 ng/mL (ref <=4.00)

## 2022-12-26 LAB — VITAMIN D 25 HYDROXY (VIT D DEFICIENCY, FRACTURES): Vit D, 25-Hydroxy: 49 ng/mL (ref 30–100)

## 2022-12-26 LAB — INSULIN, RANDOM: Insulin: 47.9 u[IU]/mL — ABNORMAL HIGH

## 2022-12-26 NOTE — Progress Notes (Signed)
^<^<^<^<^<^<^<^<^<^<^<^<^<^<^<^<^<^<^<^<^<^<^<^<^<^<^<^<^<^<^<^<^<^<^<^<^ ^>^>^>^>^>^>^>^>^>^>^>>^>^>^>^>^>^>^>^>^>^>^>^>^>^>^>^>^>^>^>^>^>^>^>^>^>  -  Glucose = 158 mg % is very high ( normal is less than 120 mg%  )  &  A1c =  6.0% also too high  ( Normal is less than 5.7% )   Blood sugar and A1c are elevated in the borderline and                                                              early or pre-diabetes range which has the same   300% increased risk for heart attack, stroke, cancer and                                                 alzheimer- type vascular dementia as full blown diabetes.   But the good news is that diet, exercise with                                                          weight loss can cure the early diabetes at this point.  Your blood sugar and A1c are elevated.    Being diabetic has a  300% increased risk for heart attack,                                                     stroke, cancer, and alzheimer- type vascular dementia.   - It is very important that you work harder with diet by                                     avoiding all foods that are white except chicken, fish & calliflower.  - Avoid white rice  (brown & wild rice is OK),   - Avoid white potatoes  (sweet potatoes in moderation is OK),   White bread or wheat bread or anything made out of   white flour like bagels, donuts, rolls, buns, biscuits, cakes,  - pastries, cookies, pizza crust, and pasta (made from  white flour & egg whites)   - vegetarian pasta or spinach or wheat pasta is OK.  - Multigrain breads like Arnold's, Pepperidge [employer] or   multigrain sandwich thins or high fiber breads like   Eureka bread or "Dave's Killer" breads that are  4 to 5 grams fiber per slice !  are best.    Diet, exercise and weight loss can reverse and cure  diabetes in the early stages.    - Diet, exercise and weight loss is very important in the   control and prevention of  complications of diabetes   ^<^<^<^<^<^<^<^<^<^<^<^<^<^<^<^<^<^<^<^<^<^<^<^<^<^<^<^<^<^<^<^<^<^<^<^<^ ^>^>^>^>^>^>^>^>^>^>^>^>^>^>^>^>^>^>^>^>^>^>^>^>^>^>^>^>^>^>^>^>^>^>^>^>^  -  Chol = 1221   &  LDL = 62 - Both  Excellent   - Very low risk for Heart Attack  / Stroke  ^>^>^>^>^>^>^>^>^>^>^>^>^>^>^>^>^>^>^>^>^>^>^>^>^>^>^>^>^>^>^>^>^>^>^>^>^ ^>^>^>^>^>^>^>^>^>^>^>^>^>^>^>^>^>^>^>^>^>^>^>^>^>^>^>^>^>^>^>^>^>^>^>^>^  -  PSA - Very Low - No Prostate Cancer  - Great !   ^>^>^>^>^>^>^>^>^>^>^>^>^>^>^>^>^>^>^>^>^>^>^>^>^>^>^>^>^>^>^>^>^>^>^>^>^  - Vitamin D = 49 is Low   - Vitamin D goal is between 70-100.   - Please make sure that you are taking your Vitamin D as directed= 10,000 units /day   - It is very important as a natural anti-inflammatory and helping the                           immune system protect against viral infections, like the Covid-19    helping hair, skin, and nails, as well as reducing stroke and heart attack risk.   - It helps your bones and helps with mood.  - It also decreases numerous cancer risks so please                                                                                           take it as directed.   - Low Vit D is associated with a 200-300% higher risk for CANCER   and 200-300% higher risk for HEART   ATTACK  &  STROKE.    - It is also associated with higher death rate at younger ages,   autoimmune diseases like Rheumatoid arthritis, Lupus, Multiple Sclerosis.     - Also many other serious conditions, like depression, Alzheimer's  Dementia,  muscle aches, fatigue, fibromyalgia   ^>^>^>^>^>^>^>^>^>^>^>^>^>^>^>^>^>^>^>^>^>^>^>^>^>^>^>^>^>^>^>^>^>^>^>^>^ ^>^>^>^>^>^>^>^>^>^>^>^>^>^>^>^>^>^>^>^>^>^>^>^>^>^>^>^>^>^>^>^>^>^>^>^>^  - All Else - CBC - Kidneys - Electrolytes - Liver - Magnesium & Thyroid    - all  Normal /  OK  ^>^>^>^>^>^>^>^>^>^>^>^>^>^>^>^>^>^>^>^>^>^>^>^>^>^>^>^>^>^>^>^>^>^>^>^>^ ^>^>^>^>^>^>^>^>^>^>^>^>^>^>^>^>^>^>^>^>^>^>^>^>^>^>^>^>^>^>^>^>^>^>^>^>^

## 2022-12-27 ENCOUNTER — Encounter: Payer: Self-pay | Admitting: Internal Medicine

## 2023-02-09 ENCOUNTER — Encounter: Payer: Self-pay | Admitting: Nurse Practitioner

## 2023-02-09 ENCOUNTER — Ambulatory Visit: Payer: PPO | Attending: Nurse Practitioner | Admitting: Nurse Practitioner

## 2023-02-09 VITALS — BP 124/82 | HR 86 | Ht 73.0 in | Wt 181.2 lb

## 2023-02-09 DIAGNOSIS — E785 Hyperlipidemia, unspecified: Secondary | ICD-10-CM | POA: Diagnosis not present

## 2023-02-09 DIAGNOSIS — F17200 Nicotine dependence, unspecified, uncomplicated: Secondary | ICD-10-CM

## 2023-02-09 DIAGNOSIS — I480 Paroxysmal atrial fibrillation: Secondary | ICD-10-CM

## 2023-02-09 DIAGNOSIS — I739 Peripheral vascular disease, unspecified: Secondary | ICD-10-CM | POA: Diagnosis not present

## 2023-02-09 DIAGNOSIS — I10 Essential (primary) hypertension: Secondary | ICD-10-CM

## 2023-02-09 MED ORDER — DOFETILIDE 500 MCG PO CAPS: 500.0000 ug | ORAL_CAPSULE | Freq: Two times a day (BID) | ORAL | 1 refills | Status: DC

## 2023-02-09 NOTE — Patient Instructions (Addendum)
Medication Instructions:  Your physician recommends that you continue on your current medications as directed. Please refer to the Current Medication list given to you today.  *If you need a refill on your cardiac medications before your next appointment, please call your pharmacy*   Lab Work: NONE ordered at this time of appointment    Testing/Procedures: Your physician has requested that you have an ankle brachial index (ABI). During this test an ultrasound and blood pressure cuff are used to evaluate the arteries that supply the arms and legs with blood. Allow thirty minutes for this exam. There are no restrictions or special instructions.  VAS Aorta/Iliac/complete   Follow-Up: At [facility], you and your health needs are our priority.  As part of our continuing mission to provide you with exceptional heart care, we have created designated Provider Care Teams.  These Care Teams include your primary Cardiologist (physician) and Advanced Practice Providers (APPs -  Physician Assistants and Nurse Practitioners) who all work together to provide you with the care you need, when you need it.  We recommend signing up for the patient portal called "MyChart".  Sign up information is provided on this After Visit Summary.  MyChart is used to connect with patients for Virtual Visits (Telemedicine).  Patients are able to view lab/test results, encounter notes, upcoming appointments, etc.  Non-urgent messages can be sent to your provider as well.   To learn more about what you can do with MyChart, go to [email or web address].    Your next appointment:   6 month(s) Next Available Dr. Elberta Fortis  Provider:   Nanetta Batty, MD

## 2023-02-09 NOTE — Progress Notes (Signed)
Office Visit    Patient Name: Bradley Ferguson Date of Encounter: 02/09/2023  Primary Care Provider:  [name], MD Primary Cardiologist:  Nanetta Batty, MD  Chief Complaint    72 year old male with a history of paroxysmal atrial fibrillation, PVD, hypertension, hyperlipidemia, Raynaud's phenomenon, and tobacco use who presents for follow-up related to atrial fibrillation and PVD.  Past Medical History    Past Medical History:  Diagnosis Date   Anxiety    Atrial fibrillation (HCC)    Depression    PVD (peripheral vascular disease) (HCC)    Raynaud's syndrome    Systemic sclerosis (HCC)    TB lung, latent 12/19/2019   Transaminitis 06/18/2020   Past Surgical History:  Procedure Laterality Date   ATRIAL FIBRILLATION ABLATION     CATARACT EXTRACTION Right    COLONOSCOPY     EYE SURGERY     FRACTURE SURGERY Left 1976   INGUINAL HERNIA REPAIR Right 03/23/2020   Procedure: OPEN RIGHT INGUINAL HERNIA REPAIR WITH MESH;  Surgeon: Luretha Murphy, MD;  Location: [facility] OR;  Service: General;  Laterality: Right;   INSERTION OF MESH Right 03/23/2020   Procedure: INSERTION OF MESH;  Surgeon: Luretha Murphy, MD;  Location: [facility] OR;  Service: General;  Laterality: Right;   LOWER EXTREMITY INTERVENTION N/A 04/27/2017   Procedure: LOWER EXTREMITY INTERVENTION;  Surgeon: Runell Gess, MD;  Location: [facility] INVASIVE CV LAB;  Service: Cardiovascular;  Laterality: N/A;   PERIPHERAL VASCULAR ATHERECTOMY Right 04/27/2017   Procedure: PERIPHERAL VASCULAR ATHERECTOMY;  Surgeon: Runell Gess, MD;  Location: [facility] INVASIVE CV LAB;  Service: Cardiovascular;  Laterality: Right;  Iliac   PERIPHERAL VASCULAR INTERVENTION Right 04/27/2017   Procedure: PERIPHERAL VASCULAR INTERVENTION;  Surgeon: Runell Gess, MD;  Location: [facility] INVASIVE CV LAB;  Service: Cardiovascular;  Laterality: Right;  Iliac    Allergies  Allergies  Allergen Reactions   Cymbalta [Duloxetine Hcl]    Effexor [Venlafaxine]      Feels bad   Wellbutrin [Bupropion]     sleepy   Zoloft [Sertraline Hcl]     Felt bad     Labs/Other Studies Reviewed    The following studies were reviewed today:  Cardiac Studies & Procedures     STRESS TESTS  NM MYOCAR MULTI W/SPECT W 10/10/2009  Narrative Clinical Data: Chest pain, tachycardia  NUCLEAR MEDICINE MYOCARDIAL PERFUSION IMAGING NUCLEAR MEDICINE LEFT VENTRICULAR WALL MOTION ANALYSIS NUCLEAR MEDICINE LEFT VENTRICULAR EJECTION FRACTION CALCULATION  Technique: Standard single day myocardial SPECT imaging was performed after resting intravenous injection of Tc-24m Myoview. After intravenous infusion of adenosine under supervision of cardiology staff, Myoview was injected intravenously and standard myocardial SPECT imaging was performed. Quantitative gated imaging was also performed to evaluate left ventricular wall motion and estimate left ventricular ejection fraction.  Radiopharmaceutical: 10+30 mCi Tc21m Myoview IV.  Comparison: None  Findings:    The stress SPECT images demonstrate physiologic distribution of radiopharmaceutical. Rest images demonstrate no perfusion defects. The gated stress SPECT images demonstrate normal left ventricular myocardial thickening.  No focal wall motion abnormality is seen. Calculated left ventricular end-diastolic volume 70ml, end-systolic volume 26ml, ejection fraction of 63%.  IMPRESSION:  1. Negative for pharmacologic-stress induced ischemia.  2. Left ventricular ejection fraction 63%.  Provider: Arminda Resides, Hannah Beat, Shanin Stedge   ECHOCARDIOGRAM  ECHOCARDIOGRAM COMPLETE 03/08/2020  Narrative ECHOCARDIOGRAM REPORT    Patient Name:   Bradley Ferguson Date of Exam: 03/08/2020 Medical Rec #:  [id number]      Height:  74.0 in Accession #:    [id number]     Weight:       175.0 lb Date of Birth:  July 09, 1950      BSA:          2.054 m Patient Age:    69 years       BP:           136/72 mmHg Patient  Gender: M              HR:           69 bpm. Exam Location:  [address]  Procedure: 2D Echo, Cardiac Doppler and Color Doppler  Indications:    I27.20 Pulmonary Hypertension  History:        Patient has prior history of Echocardiogram examinations, most recent 03/11/2018. Arrythmias:Atrial Fibrillation; Risk Factors:Hypertension and Dyslipidemia. Atherosclerosis of aorta.  Sonographer:    Cathie Beams RCS Referring Phys: [id number] [facility]  IMPRESSIONS   1. Left ventricular ejection fraction, by estimation, is 65 to 70%. The left ventricle has hyperdynamic function. The left ventricle has no regional wall motion abnormalities. There is mild left ventricular hypertrophy. Left ventricular diastolic parameters are consistent with Grade II diastolic dysfunction (pseudonormalization). 2. Right ventricular systolic function is normal. The right ventricular size is normal. There is normal pulmonary artery systolic pressure. The estimated right ventricular systolic pressure is 26.4 mmHg. 3. Left atrial size was mildly dilated. 4. Chiari network noted. Right atrial size was mildly dilated. 5. The mitral valve is normal in structure. No evidence of mitral valve regurgitation. No evidence of mitral stenosis. Moderate mitral annular calcification. 6. The aortic valve is tricuspid. Aortic valve regurgitation is not visualized. Mild aortic valve sclerosis is present, with no evidence of aortic valve stenosis. 7. The inferior vena cava is normal in size with greater than 50% respiratory variability, suggesting right atrial pressure of 3 mmHg.  FINDINGS Left Ventricle: Left ventricular ejection fraction, by estimation, is 65 to 70%. The left ventricle has hyperdynamic function. The left ventricle has no regional wall motion abnormalities. The left ventricular internal cavity size was normal in size. There is mild left ventricular hypertrophy. Left ventricular diastolic parameters are consistent  with Grade II diastolic dysfunction (pseudonormalization).  Right Ventricle: The right ventricular size is normal. No increase in right ventricular wall thickness. Right ventricular systolic function is normal. There is normal pulmonary artery systolic pressure. The tricuspid regurgitant velocity is 2.42 m/s, and with an assumed right atrial pressure of 3 mmHg, the estimated right ventricular systolic pressure is 26.4 mmHg.  Left Atrium: Left atrial size was mildly dilated.  Right Atrium: Chiari network noted. Right atrial size was mildly dilated.  Pericardium: There is no evidence of pericardial effusion.  Mitral Valve: The mitral valve is normal in structure. Moderate mitral annular calcification. No evidence of mitral valve regurgitation. No evidence of mitral valve stenosis.  Tricuspid Valve: The tricuspid valve is normal in structure. Tricuspid valve regurgitation is trivial.  Aortic Valve: The aortic valve is tricuspid. Aortic valve regurgitation is not visualized. Mild aortic valve sclerosis is present, with no evidence of aortic valve stenosis.  Pulmonic Valve: The pulmonic valve was normal in structure. Pulmonic valve regurgitation is not visualized.  Aorta: The aortic root is normal in size and structure.  Venous: The inferior vena cava is normal in size with greater than 50% respiratory variability, suggesting right atrial pressure of 3 mmHg.  IAS/Shunts: No atrial level shunt detected by color flow Doppler.  LEFT VENTRICLE PLAX 2D LVIDd:         3.30 cm  Diastology LVIDs:         2.00 cm  LV e' medial:    5.98 cm/s LV PW:         1.70 cm  LV E/e' medial:  21.4 LV IVS:        1.20 cm  LV e' lateral:   8.74 cm/s LVOT diam:     2.10 cm  LV E/e' lateral: 14.7 LV SV:         78 LV SV Index:   38 LVOT Area:     3.46 cm   RIGHT VENTRICLE RV Basal diam:  3.00 cm RV S prime:     13.30 cm/s TAPSE (M-mode): 2.2 cm RVSP:           26.4 mmHg  LEFT ATRIUM             Index        RIGHT ATRIUM           Index LA diam:        4.20 cm 2.05 cm/m  RA Pressure: 3.00 mmHg LA Vol (A2C):   85.0 ml 41.39 ml/m RA Area:     18.70 cm LA Vol (A4C):   73.1 ml 35.60 ml/m RA Volume:   49.70 ml  24.20 ml/m LA Biplane Vol: 78.2 ml 38.08 ml/m AORTIC VALVE LVOT Vmax:   115.00 cm/s LVOT Vmean:  65.200 cm/s LVOT VTI:    0.224 m  AORTA Ao Root diam: 3.50 cm  MITRAL VALVE                TRICUSPID VALVE MV Area (PHT): 2.24 cm     TR Peak grad:   23.4 mmHg MV Decel Time: 338 msec     TR Vmax:        242.00 cm/s MV E velocity: 128.33 cm/s  Estimated RAP:  3.00 mmHg MV A velocity: 102.17 cm/s  RVSP:           26.4 mmHg MV E/A ratio:  1.26 SHUNTS Systemic VTI:  0.22 m Systemic Diam: 2.10 cm  Marca Ancona MD Electronically signed by Marca Ancona MD Signature Date/Time: 03/08/2020/3:48:17 PM    Final            Recent Labs: 12/25/2022: ALT 13; BUN 17; Creat 1.03; Hemoglobin 13.5; Magnesium 2.0; Platelets 134; Potassium 4.3; Sodium 141; TSH 1.68  Recent Lipid Panel    Component Value Date/Time   CHOL 121 12/25/2022 1117   CHOL 132 08/10/2017 1545   TRIG 146 12/25/2022 1117   HDL 36 (L) 12/25/2022 1117   HDL 44 08/10/2017 1545   CHOLHDL 3.4 12/25/2022 1117   VLDL 35 (H) 02/05/2017 1234   LDLCALC 62 12/25/2022 1117    History of Present Illness    72 year old male with the above past medical history including paroxysmal atrial fibrillation, PVD, hypertension, hyperlipidemia, Raynaud's phenomenon, and tobacco use.   He has a history of atrial fibrillation s/p ablation in 2011, on Xarelto. Follows with EP.  Myoview in 2011 was low risk, no evidence of ischemia.  Most recent echocardiogram in 02/2020 showed EF 65 to 70%, hyperdynamic LV function, no RWMA, mild LVH, G2 DD, normal RV, mild aortic sclerosis without evidence of stenosis.  Additionally, he has a history of PAD.  Peripheral angiography in 2018 revealed 99% calcified eccentric proximal right common iliac  artery stenosis and bilateral long segment SFA CTO  occlusions which were highly calcified.  He underwent diamondback orbital rotational atherectomy, PTCA and covered stenting of the right common iliac artery.  He was last seen in the office on 10/24/2021 and was stable from a cardiac standpoint.  He did report some persistent claudication.  ABIs/aortic/iliac ultrasound in 11/2021 revealed patent right common iliac artery stent with otherwise moderate right lower extremity arterial disease, moderate left lower extremity arterial disease, essentially unchanged compared to prior study.  Repeat ultrasound was recommended in 12 months.   He presents today for follow-up.  Since his last visit he has been stable from a cardiac standpoint.  He notes stable claudication, intermittent shortness of breath, unchanged from prior visits, he denies any chest pain, palpitations, dizziness, edema, PND, orthopnea, weight gain. He continues to smoke. He is in need of a refill of his Tikosyn. Overall, his symptoms have been stable and he denies any new concerns today.  Home Medications    Current Outpatient Medications  Medication Sig Dispense Refill   albuterol (VENTOLIN HFA) 108 (90 Base) MCG/ACT inhaler Use  2 inhalations  15 minutes apart  every 4 hours  to rescue Asthma Attack 48 g 3   aspirin EC 81 MG tablet Take 81 mg by mouth daily. Swallow whole.     atorvastatin (LIPITOR) 80 MG tablet Take 1 tablet Daily for Cholesterol 90 tablet 3   Cholecalciferol 250 MCG (10000 UT) CAPS Take 10,000 Units by mouth daily.      dofetilide (TIKOSYN) 500 MCG capsule TAKE ONE CAPSULE BY MOUTH TWICE A DAY 180 capsule 1   gabapentin (NEURONTIN) 300 MG capsule Take 1 capsule 3 x /day for Neuropathy Pain 270 capsule 3   pyridOXINE (VITAMIN B-6) 100 MG tablet Take 100 mg by mouth daily.     rivaroxaban (XARELTO) 20 MG TABS tablet Take 1 tablet Daily to Prevent Blood Clots 90 tablet 3   No current facility-administered medications for  this visit.     Review of Systems    He denies chest pain, palpitations, pnd, orthopnea, n, v, dizziness, syncope, edema, weight gain, or early satiety. All other systems reviewed and are otherwise negative except as noted above.   Physical Exam    VS:  BP 124/82   Pulse 86   Ht 6\' 1"  (1.854 m)   Wt 181 lb 3.2 oz (82.2 kg)   SpO2 95%   BMI 23.91 kg/m   GEN: Well nourished, well developed, in no acute distress. HEENT: normal. Neck: Supple, no JVD, carotid bruits, or masses. Cardiac: RRR, no murmurs, rubs, or gallops. No clubbing, cyanosis, edema.  Radials/DP/PT 2+ and equal bilaterally.  Respiratory:  Respirations regular and unlabored, clear to auscultation bilaterally. GI: Soft, nontender, nondistended, BS + x 4. MS: no deformity or atrophy. Skin: warm and dry, no rash. Neuro:  Strength and sensation are intact. Psych: Normal affect.  Accessory Clinical Findings    ECG personally reviewed by me today - EKG Interpretation Date/Time:  Monday February 09 2023 11:24:45 EDT Ventricular Rate:  86 PR Interval:  178 QRS Duration:  126 QT Interval:  408 QTC Calculation: 488 R Axis:   95  Text Interpretation: Normal sinus rhythm Right bundle branch block When compared with ECG of 10-May-2019 11:43, No significant change was found Confirmed by Bernadene Person ([id number]) on 02/09/2023 11:45:19 AM  - no acute changes.   Lab Results  Component Value Date   WBC 6.8 12/25/2022   HGB 13.5 12/25/2022   HCT 40.1 12/25/2022  MCV 92.0 12/25/2022   PLT 134 (L) 12/25/2022   Lab Results  Component Value Date   CREATININE 1.03 12/25/2022   BUN 17 12/25/2022   NA 141 12/25/2022   K 4.3 12/25/2022   CL 106 12/25/2022   CO2 24 12/25/2022   Lab Results  Component Value Date   ALT 13 12/25/2022   AST 20 12/25/2022   ALKPHOS 73 08/10/2017   BILITOT 0.8 12/25/2022   Lab Results  Component Value Date   CHOL 121 12/25/2022   HDL 36 (L) 12/25/2022   LDLCALC 62 12/25/2022   TRIG 146  12/25/2022   CHOLHDL 3.4 12/25/2022    Lab Results  Component Value Date   HGBA1C 6.0 (H) 12/25/2022    Assessment & Plan    1. Paroxysmal atrial fibrillation: Maintaining sinus rhythm.  Denies palpitations. Recent CBC, CMET, TSH, and magnesium were stable. Continue Tikosyn, Xarelto.  He is overdue for follow-up with EP, advised him to schedule an appointment.    2. PVD: ABIs/aortic/iliac ultrasound in 11/2021 revealed patent right common iliac artery stent with otherwise moderate right lower extremity arterial disease, moderate left lower extremity arterial disease, essentially unchanged compared to prior study.  He notes persistent claudication.  Will repeat ABIs/aortic/iliac ultrasound for routine monitoring and per Dr. Hazle Coca recommendation.  Continue Lipitor.  3. Hypertension: BP well controlled. Continue current antihypertensive regimen.   4. Hyperlipidemia: LDL was in 11/2022. Continue Lipitor.   5. Tobacco use: He continues to smoke.  Full cessation advised.  6. Disposition: Follow-up in 6 months.     Joylene Grapes, NP 02/09/2023, 12:07 PM

## 2023-02-23 ENCOUNTER — Ambulatory Visit (HOSPITAL_COMMUNITY)
Admission: RE | Admit: 2023-02-23 | Discharge: 2023-02-23 | Disposition: A | Discharge status: Home / Self Care | Payer: PPO | Source: Ambulatory Visit | Attending: Cardiology | Admitting: Cardiology

## 2023-02-23 ENCOUNTER — Ambulatory Visit (HOSPITAL_BASED_OUTPATIENT_CLINIC_OR_DEPARTMENT_OTHER)
Admission: RE | Admit: 2023-02-23 | Discharge: 2023-02-23 | Disposition: A | Discharge status: Home / Self Care | Payer: PPO | Source: Ambulatory Visit | Attending: Cardiology | Admitting: Cardiology

## 2023-02-23 DIAGNOSIS — I739 Peripheral vascular disease, unspecified: Secondary | ICD-10-CM | POA: Diagnosis not present

## 2023-02-23 DIAGNOSIS — Z95820 Peripheral vascular angioplasty status with implants and grafts: Secondary | ICD-10-CM | POA: Diagnosis not present

## 2023-02-25 LAB — VAS US ABI WITH/WO TBI
Left ABI: 0.39
Right ABI: 0.55

## 2023-03-24 ENCOUNTER — Telehealth: Payer: Self-pay

## 2023-03-24 NOTE — Telephone Encounter (Signed)
Spoke with pt. Pt was notified of VAS [place] and VAS duplex results. Pt needs a follow up appointment in the next couple of months with Dr. Allyson Sabal for peripheral artery disease per Bernadene Person NP.

## 2023-03-27 ENCOUNTER — Telehealth: Payer: Self-pay | Admitting: Cardiovascular Disease

## 2023-03-27 NOTE — Telephone Encounter (Signed)
Received a message that patient needs follow up with Dr. Allyson Sabal in the next couple of months for PAD. I scheduled him for 01/07 as this was the next available with Dr. Allyson Sabal. Patient stated that he was told this was critical and needed an appointment sooner. Can patient be scheduled with Azalee Course, PA? Is appointment critical that he needs to be seen sooner? Please advise.

## 2023-03-30 NOTE — Progress Notes (Unsigned)
3 MONTH FOLLOW UP Assessment:   Labile hypertension Continue medication Monitor blood pressure at home; call if consistently over 130/80 Continue DASH diet.   Reminder to go to the ER if any CP, SOB, nausea, dizziness, severe HA, changes vision/speech, left arm numbness and tingling and jaw pain.  Paroxysmal atrial fibrillation (HCC) Rate controlled today, doing well on tikosyn, xarelto  He has been getting his meds refilled by Dr. Allyson Sabal but has not been to see him in since was 10/24/21- strongly encouraged to schedule Appointment Followed by Dr. Allyson Sabal   Systemic scleroses [facility] Dr. Corliss Skains following;strongly encouraged to schedule appointment , has not been seen x several years He has extensive questions and concerns; discussed at length today, but encouraged him to follow up with Dr. Corliss Skains for more complete discussion of her recommendations, risks/benefits  Vitamin D deficiency Continue supplementation- taking 54098 IU daily    TOBACCO ABUSE Discussed risks associated with tobacco use and advised to reduce or quit Continue to smoke 1/2-3/4 ppd Patient is ready to do so and plans to slow taper  Declines Chantix or other medication, has nicotine patches Will follow up at the next visit Continue annual low dose CT lung screening; Due now  COPD/emphysema (HCC) Stop smoking, pulm following; given trelegy samples, he will follow up with pulm, consider advair/neb meds if cost prohibitive  Continue to follow with Dr Isaiah Serge Completed handicap placard application for severe shortness of breath  ILD (HCC) Referred back to Dr. Isaiah Serge for reevaluation Continue inhalers  Abnormal glucose Discussed disease and risks Discussed diet/exercise, weight management  Reminded to schedule appointment with ophthalmology -     CMP  Hyperlipidemia, mixed Continue medications: on atorvastatin, pending repatha Continue low cholesterol diet and exercise.  Check lipid panel.  -     Lipid  panel   Bipolar I disorder (HCC) Depression has increased but refuses medication Lifestyle discussed: diet/exerise, sleep hygiene, stress management, hydration Monitor Instructed patient to contact office or on-call physician promptly should condition worsen or any new symptoms appear. IF THE PATIENT HAS ANY SUICIDAL OR HOMICIDAL IDEATIONS, CALL THE OFFICE, DISCUSS WITH A SUPPORT MEMBER, OR GO TO THE ER IMMEDIATELY. Patient was agreeable with this plan.   Claudication in peripheral vascular disease (HCC) Followed by Dr. Allyson Sabal, s/p stent, continued claudication, medical treatment, - pending pletal and repatha but was not started Recommended to quit smoking, regular exercise  Acquired Thrombophilia(HCC) Continue Xarelto daily Monitor for bruising and bleeding  Attention deficit disorder, unspecified hyperactivity presence Currently stable, doing well at current job off of medications   BMI 22 Long discussion about weight loss, diet, and exercise Recommended diet heavy in fruits and veggies and low in animal meats, cheeses, and dairy products, appropriate calorie intake Discussed appropriate weight for height Follow up at next visit  Medication management -     CBC with Differential/Platelet -     COMPLETE METABOLIC PANEL WITH GFR -     Magnesium    Over 30 minutes of exam, counseling, chart review, and critical decision making was performed  Future Appointments  Date Time Provider Department Center  03/31/2023  2:30 PM [name], NP [facility] None  05/05/2023 11:15 AM Regan Lemming, MD [facility]  07/06/2023  2:30 PM [name], MD [facility] None  07/07/2023 11:15 AM Runell Gess, MD [facility] None  10/05/2023  2:30 PM [name], NP [facility] None  01/05/2024 11:00 AM [name], MD [facility] None  Subjective:  Bradley Ferguson is a 72 y.o. male who presents for Medicare Annual Wellness Visit and 3 month follow up  for HTN, hyperlipidemia, prediabetes, and vitamin D Def.   He has seen podiatry 05/2020 about wart on bottom of right foot- he did have an insole made. He continues to have pain in the right foot. He still has the callus are on lateral right foot on bottom  He does have hx of mixed bipolar type depression with ADD and anxiety though in recently years has been apparently stable on xanax, recently on no medication.  Patient was recently dx'd by Rheumatology (Dr Corliss Skains) with limited systemic sclerosis with intentions to start Imuran has not followed up with  their office.   High-resolution Chest CT in Jan found basilar fibrotic interstitial lung disease with probable UIP & patient has also been evaluated by Pulmonary by Dr Isaiah Serge.  He never did start the Imuran, last visit with her was 12/18/21 was offered therapy for interstitial lung disease but he declined.  Gets shortness of breath easily.  he currently continues to smoke 1/2-3/4 pack a day, 50 pack year history; discussed risks associated with smoking, patient is not ready to quit, but slowly tapering down. He had low dose CT lung cancer screening 06/04/21. CT chest high resolution has been ordered by Dr. Isaiah Serge but pt has not had completed.   Colonoscopy 08/01/20 several more polyps removed, next colonoscopy due 2025  BMI is There is no height or weight on file to calculate BMI., he has been working on diet, exercise limited by cardiovascular disease (PAD/claudication).  Wt Readings from Last 3 Encounters:  02/09/23 181 lb 3.2 oz (82.2 kg)  12/25/22 182 lb 9.6 oz (82.8 kg)  08/26/22 190 lb 6.4 oz (86.4 kg)   Hx/o pAfib s/p ablation in 2011 and was seen in 2019 for CPE and was back in Afib and started on Xarelto, and was admitted for initiation of Tikosyn with successful conversion in Oct 2019. He is followed by Dr. Johney Frame and also Dr. Allyson Sabal for PVD with claudification, peripheral angiography on  04/27/17 revealing a 99% calcified eccentric  proximal right common iliac artery stenosis and bilateral long segment SFA chronic total occlusions which were highly calcified. Pt underwent direct back orbital rotational atherectomy, PTCA and cover stenting, however with persistent/recurrent sx, managed medically.   His blood pressure has been controlled at home, today their BP is    BP Readings from Last 3 Encounters:  02/09/23 124/82  12/25/22 128/78  08/26/22 128/68    He does not workout. He denies chest pain, shortness of breath, dizziness.   He continues to smoke 3/4 ppd. Chest CT scan on 01/19/20  Showed Aortic Atherosclerosis and chronic Interstitial ung disease- 12 month follow up is recommended.  He is on cholesterol medication (atorvastatin 80 mg daily) and denies myalgias. His cholesterol is not at goal. He was previously evaluated by lipid clinic for consideration of repatha, pending insurance approval at lipid clinic.The cholesterol last visit was:   Lab Results  Component Value Date   CHOL 121 12/25/2022   HDL 36 (L) 12/25/2022   LDLCALC 62 12/25/2022   TRIG 146 12/25/2022   CHOLHDL 3.4 12/25/2022   He has not been working on diet and exercise for prediabetes, and denies increased appetite, nausea, polydipsia, polyuria, visual disturbances, vomiting and weight loss. Last A1C in the office was:  Lab Results  Component Value Date   HGBA1C 6.0 (H) 12/25/2022   Last GFR  Lab Results  Component Value Date   EGFR 78 12/25/2022    Patient is on Vitamin D supplement, taking 16109 IU daily    Lab Results  Component Value Date   VD25OH 49 12/25/2022          Medication Review:   Current Outpatient Medications (Cardiovascular):    atorvastatin (LIPITOR) 80 MG tablet, Take 1 tablet Daily for Cholesterol   dofetilide (TIKOSYN) 500 MCG capsule, Take 1 capsule (500 mcg total) by mouth 2 (two) times daily.  Current Outpatient Medications (Respiratory):    albuterol (VENTOLIN HFA) 108 (90 Base) MCG/ACT inhaler, Use   2 inhalations  15 minutes apart  every 4 hours  to rescue Asthma Attack  Current Outpatient Medications (Analgesics):    aspirin EC 81 MG tablet, Take 81 mg by mouth daily. Swallow whole.  Current Outpatient Medications (Hematological):    rivaroxaban (XARELTO) 20 MG TABS tablet, Take 1 tablet Daily to Prevent Blood Clots  Current Outpatient Medications (Other):    Cholecalciferol 250 MCG (10000 UT) CAPS, Take 10,000 Units by mouth daily.    gabapentin (NEURONTIN) 300 MG capsule, Take 1 capsule 3 x /day for Neuropathy Pain   pyridOXINE (VITAMIN B-6) 100 MG tablet, Take 100 mg by mouth daily.  Allergies: Allergies  Allergen Reactions   Cymbalta [Duloxetine Hcl]    Effexor [Venlafaxine]     Feels bad   Wellbutrin [Bupropion]     sleepy   Zoloft [Sertraline Hcl]     Felt bad    Current Problems (verified) has ANXIETY DEPRESSION; TOBACCO ABUSE; RAYNAUDS SYNDROME; Attention deficit disorder; Dental caries; Bipolar I disorder (HCC); Atrial fibrillation (HCC); Hyperlipidemia, mixed; Vitamin D deficiency; Labile hypertension; BPH; Claudication in peripheral vascular disease (HCC); Visit for monitoring Tikosyn therapy; Colon polyps; Acquired thrombophilia (HCC); Atherosclerosis of aorta (HCC) by Chest CT scan on 01/19/2020; Chronic obstructive pulmonary disease (HCC); Centrilobular emphysema (HCC); Interstitial pulmonary disease (HCC); Positive ANA (antinuclear antibody); TB lung, latent; Systemic scleroses (HCC); Right inguinal hernia; Essential hypertension; Corn of foot; Elevated LFTs; and Hepatic steatosis on their problem list.  Screening Tests Immunization History  Administered Date(s) Administered   Influenza Whole 04/30/2010   Influenza, High Dose Seasonal PF 03/20/2017, 03/29/2018, 04/06/2019, 04/30/2020, 05/08/2021   Moderna SARS-COV2 Booster Vaccination 05/12/2020   Moderna Sars-Covid-2 Vaccination 05/12/2020   PFIZER Comirnaty(Gray Top)Covid-19 Tri-Sucrose Vaccine 03/20/2022    PFIZER(Purple Top)SARS-COV-2 Vaccination 08/04/2019, 08/29/2019   Pfizer Covid-19 Vaccine Bivalent Booster 50yrs & up 05/11/2021   Pneumococcal Conjugate-13 02/05/2017   Pneumococcal Polysaccharide-23 04/30/2010, 03/29/2018   Td 06/30/2013    Health Maintenance  Topic Date Due   Zoster Vaccines- Shingrix (1 of 2) Never done   Lung Cancer Screening  06/04/2022   INFLUENZA VACCINE  01/29/2023   COVID-19 Vaccine (6 - 2023-24 season) 03/01/2023   DTaP/Tdap/Td (2 - Tdap) 07/01/2023   Colonoscopy  08/02/2023   Medicare Annual Wellness (AWV)  08/27/2023   Pneumonia Vaccine 65+ Years old  Completed   Hepatitis C Screening  Completed   HPV VACCINES  Aged Out     Names of Other Physician/Practitioners you currently use: 1. [facility] here for primary care 2. Dr. Dione Booze, eye doctor, last visit 2023,   3. Full dentures   Patient Care Team: [name], MD as PCP - General (Internal Medicine) Regan Lemming, MD as PCP - Electrophysiology (Cardiology) Runell Gess, MD as PCP - Cardiology (Cardiology) Runell Gess, MD as Consulting Physician (Cardiology) Hilarie Fredrickson, MD  as Consulting Physician (Gastroenterology) Sallye Lat, MD as Consulting Physician (Ophthalmology) Charlett Nose, [facility] (Inactive) as Pharmacist (Pharmacist)  Surgical: He  has a past surgical history that includes Atrial fibrillation ablation; LOWER EXTREMITY INTERVENTION (N/A, 04/27/2017); PERIPHERAL VASCULAR INTERVENTION (Right, 04/27/2017); PERIPHERAL VASCULAR ATHERECTOMY (Right, 04/27/2017); Cataract extraction (Right); Eye surgery; Colonoscopy; Fracture surgery (Left, 1976); Inguinal hernia repair (Right, 03/23/2020); and Insertion of mesh (Right, 03/23/2020). Family His family history includes Cancer in his father and mother. Social history  He reports that he has been smoking cigarettes. He started smoking about 53 years ago. He has a 26.9 pack-year  smoking history. He has never used smokeless tobacco. He reports current alcohol use of about 2.0 standard drinks of alcohol per week. He reports that he does not use drugs.    Objective:   There were no vitals filed for this visit.     There is no height or weight on file to calculate BMI.  General appearance: alert, no distress, WD/WN, male HEENT: normocephalic, sclerae anicteric, TMs pearly, nares patent, no discharge or erythema, pharynx normal Oral cavity: MMM, no lesions Neck: supple, no lymphadenopathy, no thyromegaly, no masses Heart: RRR, normal S1, S2, no murmurs Lungs: CTA bilaterally, coarse breath sounds and wheezing in lower lobes Abdomen: +bs, soft, non tender, non distended, no masses, no hepatomegaly, no splenomegaly Musculoskeletal: nontender, no swelling, no obvious deformity Extremities: no edema, no cyanosis, no clubbing. R foot with corn to 1st digit base and laterally  Pulses: 2+ symmetric, upper extreamities, LE eripheral pulses thready and without edema. No aortic or femoral bruits. Neurological: alert, oriented x 3, CN2-12 intact, strength normal upper extremities and lower extremities, sensation normal throughout, DTRs 2+ throughout, no cerebellar signs, gait normal Skin: 1 cm callus on bottom of right foot on lateral side Psychiatric: flat affect, behavior normal, judgement intact     [name], NP   03/30/2023

## 2023-03-31 ENCOUNTER — Encounter: Payer: Self-pay | Admitting: Nurse Practitioner

## 2023-03-31 ENCOUNTER — Ambulatory Visit (INDEPENDENT_AMBULATORY_CARE_PROVIDER_SITE_OTHER): Payer: PPO | Admitting: Nurse Practitioner

## 2023-03-31 VITALS — BP 132/60 | HR 83 | Temp 98.1°F | Ht 73.0 in | Wt 179.0 lb

## 2023-03-31 DIAGNOSIS — L84 Corns and callosities: Secondary | ICD-10-CM

## 2023-03-31 DIAGNOSIS — I480 Paroxysmal atrial fibrillation: Secondary | ICD-10-CM

## 2023-03-31 DIAGNOSIS — I700 Atherosclerosis of aorta: Secondary | ICD-10-CM | POA: Diagnosis not present

## 2023-03-31 DIAGNOSIS — M349 Systemic sclerosis, unspecified: Secondary | ICD-10-CM

## 2023-03-31 DIAGNOSIS — F988 Other specified behavioral and emotional disorders with onset usually occurring in childhood and adolescence: Secondary | ICD-10-CM

## 2023-03-31 DIAGNOSIS — I10 Essential (primary) hypertension: Secondary | ICD-10-CM

## 2023-03-31 DIAGNOSIS — Z79899 Other long term (current) drug therapy: Secondary | ICD-10-CM

## 2023-03-31 DIAGNOSIS — I739 Peripheral vascular disease, unspecified: Secondary | ICD-10-CM | POA: Diagnosis not present

## 2023-03-31 DIAGNOSIS — E782 Mixed hyperlipidemia: Secondary | ICD-10-CM | POA: Diagnosis not present

## 2023-03-31 DIAGNOSIS — E559 Vitamin D deficiency, unspecified: Secondary | ICD-10-CM | POA: Diagnosis not present

## 2023-03-31 DIAGNOSIS — J449 Chronic obstructive pulmonary disease, unspecified: Secondary | ICD-10-CM | POA: Diagnosis not present

## 2023-03-31 DIAGNOSIS — R7309 Other abnormal glucose: Secondary | ICD-10-CM

## 2023-03-31 DIAGNOSIS — F319 Bipolar disorder, unspecified: Secondary | ICD-10-CM

## 2023-03-31 DIAGNOSIS — J849 Interstitial pulmonary disease, unspecified: Secondary | ICD-10-CM

## 2023-03-31 DIAGNOSIS — D6869 Other thrombophilia: Secondary | ICD-10-CM

## 2023-03-31 DIAGNOSIS — Z23 Encounter for immunization: Secondary | ICD-10-CM

## 2023-03-31 DIAGNOSIS — F17200 Nicotine dependence, unspecified, uncomplicated: Secondary | ICD-10-CM

## 2023-03-31 NOTE — Patient Instructions (Addendum)
Please schedule a follow up appointment with Dr. Isaiah Serge for Interstitial Lung Disease Dr. Chilton Greathouse [address], [place], [place] [address]  2.1 mi [phone]  Keep follow up appointment with Dr. Allyson Sabal for peripheral artery disease  Please cut down and quit smoking.   Refer to Dr. Cristie Hem [email or web address] Regal Kirstie Peri Dpm [address] [place], [place], [place] [address]  1.8 mi [phone]

## 2023-04-01 LAB — CBC WITH DIFFERENTIAL/PLATELET
Absolute Monocytes: 748 {cells}/uL (ref 200–950)
Basophils Absolute: 70 {cells}/uL (ref 0–200)
Basophils Relative: 0.8 %
Eosinophils Absolute: 185 {cells}/uL (ref 15–500)
Eosinophils Relative: 2.1 %
HCT: 43.5 % (ref 38.5–50.0)
Hemoglobin: 14.1 g/dL (ref 13.2–17.1)
Lymphs Abs: 1813 {cells}/uL (ref 850–3900)
MCH: 30.3 pg (ref 27.0–33.0)
MCHC: 32.4 g/dL (ref 32.0–36.0)
MCV: 93.5 fL (ref 80.0–100.0)
MPV: 12 fL (ref 7.5–12.5)
Monocytes Relative: 8.5 %
Neutro Abs: 5984 {cells}/uL (ref 1500–7800)
Neutrophils Relative %: 68 %
Platelets: 147 10*3/uL (ref 140–400)
RBC: 4.65 10*6/uL (ref 4.20–5.80)
RDW: 13.4 % (ref 11.0–15.0)
Total Lymphocyte: 20.6 %
WBC: 8.8 10*3/uL (ref 3.8–10.8)

## 2023-04-01 LAB — COMPLETE METABOLIC PANEL WITH GFR
AG Ratio: 1.7 (calc) (ref 1.0–2.5)
ALT: 12 U/L (ref 9–46)
AST: 18 U/L (ref 10–35)
Albumin: 4.5 g/dL (ref 3.6–5.1)
Alkaline phosphatase (APISO): 72 U/L (ref 35–144)
BUN: 11 mg/dL (ref 7–25)
CO2: 28 mmol/L (ref 20–32)
Calcium: 10 mg/dL (ref 8.6–10.3)
Chloride: 105 mmol/L (ref 98–110)
Creat: 1.08 mg/dL (ref 0.70–1.28)
Globulin: 2.6 g/dL (ref 1.9–3.7)
Glucose, Bld: 95 mg/dL (ref 65–99)
Potassium: 5 mmol/L (ref 3.5–5.3)
Sodium: 142 mmol/L (ref 135–146)
Total Bilirubin: 0.8 mg/dL (ref 0.2–1.2)
Total Protein: 7.1 g/dL (ref 6.1–8.1)
eGFR: 73 mL/min/{1.73_m2} (ref >=60)

## 2023-04-01 LAB — LIPID PANEL
Cholesterol: 132 mg/dL (ref <200)
HDL: 40 mg/dL (ref >=40)
LDL Cholesterol (Calc): 70 mg/dL
Non-HDL Cholesterol (Calc): 92 mg/dL (ref <130)
Total CHOL/HDL Ratio: 3.3 (calc) (ref <5.0)
Triglycerides: 135 mg/dL (ref <150)

## 2023-04-01 LAB — MAGNESIUM: Magnesium: 2.1 mg/dL (ref 1.5–2.5)

## 2023-04-09 ENCOUNTER — Ambulatory Visit: Payer: PPO | Admitting: Podiatry

## 2023-04-09 ENCOUNTER — Encounter: Payer: Self-pay | Admitting: Podiatry

## 2023-04-09 DIAGNOSIS — L84 Corns and callosities: Secondary | ICD-10-CM

## 2023-04-09 DIAGNOSIS — D689 Coagulation defect, unspecified: Secondary | ICD-10-CM | POA: Diagnosis not present

## 2023-04-09 DIAGNOSIS — I739 Peripheral vascular disease, unspecified: Secondary | ICD-10-CM | POA: Diagnosis not present

## 2023-04-09 DIAGNOSIS — M79675 Pain in left toe(s): Secondary | ICD-10-CM | POA: Diagnosis not present

## 2023-04-09 DIAGNOSIS — M79674 Pain in right toe(s): Secondary | ICD-10-CM

## 2023-04-09 DIAGNOSIS — B351 Tinea unguium: Secondary | ICD-10-CM | POA: Diagnosis not present

## 2023-04-10 NOTE — Progress Notes (Signed)
Subjective:   Patient ID: Bradley Ferguson, male   DOB: 72 y.o.   MRN: [id number]   HPI Patient presents stating that he is on blood thinner cannot take care of his nails or calluses and they get very tender at times   ROS      Objective:  Physical Exam  Vascular status intact neurologically he is diminished long-term history of diabetes does have thick yellow brittle nailbeds 1-5 both feet that become painful and keratotic lesion subfifth metatarsals bilateral painful with patient on blood thinner high risk     Assessment:  Chronic lesions mycotic nail infections bilateral with pain with patient on blood thinner high risk     Plan:  Reviewed condition daily inspections of feet with neuropathy and debrided lesions bilateral no angiogenic bleeding debrided nailbeds 1-5 both feet Neutra genic bleeding reappoint routine care

## 2023-04-27 DIAGNOSIS — Z961 Presence of intraocular lens: Secondary | ICD-10-CM | POA: Diagnosis not present

## 2023-04-27 DIAGNOSIS — H35371 Puckering of macula, right eye: Secondary | ICD-10-CM | POA: Diagnosis not present

## 2023-04-27 DIAGNOSIS — H40013 Open angle with borderline findings, low risk, bilateral: Secondary | ICD-10-CM | POA: Diagnosis not present

## 2023-05-05 ENCOUNTER — Ambulatory Visit: Payer: PPO | Attending: Cardiology | Admitting: Cardiology

## 2023-05-05 ENCOUNTER — Encounter: Payer: Self-pay | Admitting: Cardiology

## 2023-05-05 VITALS — BP 142/94 | HR 96 | Ht 73.0 in | Wt 178.2 lb

## 2023-05-05 DIAGNOSIS — I739 Peripheral vascular disease, unspecified: Secondary | ICD-10-CM | POA: Diagnosis not present

## 2023-05-05 DIAGNOSIS — I4819 Other persistent atrial fibrillation: Secondary | ICD-10-CM

## 2023-05-05 DIAGNOSIS — I10 Essential (primary) hypertension: Secondary | ICD-10-CM | POA: Diagnosis not present

## 2023-05-05 DIAGNOSIS — D6869 Other thrombophilia: Secondary | ICD-10-CM | POA: Diagnosis not present

## 2023-05-05 NOTE — Patient Instructions (Signed)
Medication Instructions:  Your physician recommends that you continue on your current medications as directed. Please refer to the Current Medication list given to you today. *If you need a refill on your cardiac medications before your next appointment, please call your pharmacy*   Follow-Up: At [facility], you and your health needs are our priority.  As part of our continuing mission to provide you with exceptional heart care, we have created designated Provider Care Teams.  These Care Teams include your primary Cardiologist (physician) and Advanced Practice Providers (APPs -  Physician Assistants and Nurse Practitioners) who all work together to provide you with the care you need, when you need it.  We recommend signing up for the patient portal called "MyChart".  Sign up information is provided on this After Visit Summary.  MyChart is used to connect with patients for Virtual Visits (Telemedicine).  Patients are able to view lab/test results, encounter notes, upcoming appointments, etc.  Non-urgent messages can be sent to your provider as well.   To learn more about what you can do with MyChart, go to [email or web address].    Your next appointment:   6 month(s)  Provider:   You will follow up in the Atrial Fibrillation Clinic located at [facility]. Your provider will be: Clint R. Fenton, PA-C or Lake Bells, PA-C

## 2023-05-05 NOTE — Progress Notes (Signed)
  Electrophysiology Office Note:   Date:  05/05/2023  ID:  Bradley Ferguson, DOB 04-Feb-1951, MRN [id number]  Primary Cardiologist: Nanetta Batty, MD Electrophysiologist: Regan Lemming, MD      History of Present Illness:   Bradley Ferguson is a 72 y.o. male with h/o PAD, hyperlipidemia, 45-pack-year tobacco use, COPD, atrial fibrillation post ablation in 2011 seen today for routine electrophysiology followup.   Since last being seen in our clinic the patient reports further episodes of atrial fibrillation.  His main issue today is lower extremity pain.  He does have PAD and is having claudication type symptoms.  He finds it difficult to ambulate.  He had to rest multiple times coming into the office today.  When he showers, he has to lay down afterwards and rest for the pain to improve.  he denies chest pain, palpitations, dyspnea, PND, orthopnea, nausea, vomiting, dizziness, syncope, edema, weight gain, or early satiety.   Review of systems complete and found to be negative unless listed in HPI.   EP Information / Studies Reviewed:    EKG is not ordered today. EKG from 02/09/23 reviewed which showed sinus rhythm, RBBB        Risk Assessment/Calculations:    CHA2DS2-VASc Score = 3   This indicates a 3.2% annual risk of stroke. The patient's score is based upon: CHF History: 0 HTN History: 1 Diabetes History: 0 Stroke History: 0 Vascular Disease History: 1 Age Score: 1 Gender Score: 0            Physical Exam:   VS:  BP (!) 142/94 (BP Location: Left Arm, Patient Position: Sitting, Cuff Size: Normal)   Pulse 96   Ht 6\' 1"  (1.854 m)   Wt 178 lb 3.2 oz (80.8 kg)   SpO2 98%   BMI 23.51 kg/m    Wt Readings from Last 3 Encounters:  05/05/23 178 lb 3.2 oz (80.8 kg)  03/31/23 179 lb (81.2 kg)  02/09/23 181 lb 3.2 oz (82.2 kg)     GEN: Well nourished, well developed in no acute distress NECK: No JVD; No carotid bruits CARDIAC: Regular rate and rhythm, no murmurs, rubs,  gallops RESPIRATORY:  Clear to auscultation without rales, wheezing or rhonchi  ABDOMEN: Soft, non-tender, non-distended EXTREMITIES:  No edema; No deformity   ASSESSMENT AND PLAN:    1.  Persistent atrial fibrillation: Currently on dofetilide.  Remains in sinus rhythm.  No changes.  2.  Hyperlipidemia: Continue atorvastatin per primary cardiology  3.  Hypertension: Mildly elevated today.  Usually well-controlled.  Plan per primary cardiology and primary physician.  4.  Peripheral arterial disease: Post angioplasty in 2018.  He is having significant leg pain which makes it difficult to ambulate.  Bradley Ferguson discuss further with primary cardiology.  5.  Secondary hypercoagulable state: Currently on Xarelto for atrial fibrillation  Follow up with Afib Clinic in 6 months  Signed, Jasie Meleski Jorja Loa, MD

## 2023-05-18 ENCOUNTER — Encounter: Payer: Self-pay | Admitting: Cardiovascular Disease

## 2023-05-18 ENCOUNTER — Ambulatory Visit: Payer: PPO | Attending: Cardiovascular Disease | Admitting: Cardiovascular Disease

## 2023-05-18 VITALS — BP 114/70 | HR 88 | Ht 73.0 in | Wt 178.8 lb

## 2023-05-18 DIAGNOSIS — F17200 Nicotine dependence, unspecified, uncomplicated: Secondary | ICD-10-CM

## 2023-05-18 DIAGNOSIS — E782 Mixed hyperlipidemia: Secondary | ICD-10-CM | POA: Diagnosis not present

## 2023-05-18 DIAGNOSIS — I10 Essential (primary) hypertension: Secondary | ICD-10-CM

## 2023-05-18 DIAGNOSIS — I739 Peripheral vascular disease, unspecified: Secondary | ICD-10-CM

## 2023-05-18 DIAGNOSIS — I4811 Longstanding persistent atrial fibrillation: Secondary | ICD-10-CM

## 2023-05-18 DIAGNOSIS — Z95828 Presence of other vascular implants and grafts: Secondary | ICD-10-CM | POA: Diagnosis not present

## 2023-05-18 LAB — BASIC METABOLIC PANEL
BUN/Creatinine Ratio: 13 (ref 10–24)
BUN: 14 mg/dL (ref 8–27)
CO2: 26 mmol/L (ref 20–29)
Calcium: 9.7 mg/dL (ref 8.6–10.2)
Chloride: 103 mmol/L (ref 96–106)
Creatinine, Ser: 1.09 mg/dL (ref 0.76–1.27)
Glucose: 107 mg/dL — ABNORMAL HIGH (ref 70–99)
Potassium: 5.1 mmol/L (ref 3.5–5.2)
Sodium: 140 mmol/L (ref 134–144)
eGFR: 73 mL/min/{1.73_m2} (ref >59)

## 2023-05-18 NOTE — Assessment & Plan Note (Signed)
Ongoing tobacco abuse 1 pack/day recalcitrant to risk factor modification.

## 2023-05-18 NOTE — Assessment & Plan Note (Signed)
History of PAD with known bilateral SFA occlusions three-vessel runoff bilaterally status post orbital atherectomy and VBX stenting of of his calcified subtotally occluded right common iliac artery 04/27/2017.  He did have a 40% left common iliac artery stenosis with a 10 mm gradient.  He says that since I saw him a year and a half ago his claudication has progressed.  His most recent Dopplers performed 02/23/2023 revealed a right ABI of 0.55 and a left of 0.39.  He does have monophasic waveforms in his iliac arteries but no high-frequency signals.  I am going to get a CTA to further evaluate.

## 2023-05-18 NOTE — Assessment & Plan Note (Addendum)
History of paroxysmal atrial fibrillation followed by Dr. Elberta Fortis on Tikosyn and Xarelto.  His last EKG performed 02/09/2023 revealed sinus rhythm with (block.

## 2023-05-18 NOTE — Assessment & Plan Note (Signed)
History of hyperlipidemia on high-dose statin therapy with lipid profile performed 03/31/2023 revealing total cholesterol 132, LDL 70 and HDL 40.

## 2023-05-18 NOTE — Patient Instructions (Signed)
Medication Instructions:  Your physician recommends that you continue on your current medications as directed. Please refer to the Current Medication list given to you today.  *If you need a refill on your cardiac medications before your next appointment, please call your pharmacy*   Lab Work: Your physician recommends that you have labs drawn today: BMET  If you have labs (blood work) drawn today and your tests are completely normal, you will receive your results only by: MyChart Message (if you have MyChart) OR A paper copy in the mail If you have any lab test that is abnormal or we need to change your treatment, we will call you to review the results.   Testing/Procedures: Non-Cardiac CT Angiography (CTA) Abdomen/Pelvis , is a special type of CT scan that uses a computer to produce multi-dimensional views of major blood vessels throughout the body. In CT angiography, a contrast material is injected through an IV to help visualize the blood vessels    Follow-Up: At [facility], you and your health needs are our priority.  As part of our continuing mission to provide you with exceptional heart care, we have created designated Provider Care Teams.  These Care Teams include your primary Cardiologist (physician) and Advanced Practice Providers (APPs -  Physician Assistants and Nurse Practitioners) who all work together to provide you with the care you need, when you need it.  We recommend signing up for the patient portal called "MyChart".  Sign up information is provided on this After Visit Summary.  MyChart is used to connect with patients for Virtual Visits (Telemedicine).  Patients are able to view lab/test results, encounter notes, upcoming appointments, etc.  Non-urgent messages can be sent to your provider as well.   To learn more about what you can do with MyChart, go to [email or web address].    Your next appointment:   12 month(s)  Provider:   Nanetta Batty, MD

## 2023-05-18 NOTE — Progress Notes (Signed)
05/18/2023 Bradley Ferguson   1950-09-10  [id number]  Primary Physician [name], MD Primary Cardiologist: Runell Gess MD Nicholes Calamity, [place]  HPI:  Bradley Ferguson is a 72 y.o.    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 He was referred by Dr. Oneta Rack for peripheral vascular evaluation because of life limiting claudication. I last saw him in the office 10/24/2021.Marland Kitchen His risk factors include treated hyperlipidemia and 45 pack years of tobacco abuse continued to smoke one pack per day. Never had a heart attack or stroke. He does complain of some dyspnea on exertion probably related to COPD. He's had A. fib ablation back in 2011 by Dr. Graciela Husbands but has not followed up since. He has complained of less limiting claudication over the last year which is symmetric and bilateral. He can walk one block after which she has to stop because of discomfort. He has had lower extremity arterial Doppler studies in our office 04/11/17 revealing a high-frequency signal in his right common iliac artery and a total left SFA.   I performed peripheral angiography on him 04/27/17 revealing a 99% calcified eccentric proximal right common iliac artery stenosis and bilateral long segment SFA chronic total occlusions which were highly calcified. I performed diamondback orbital rotational atherectomy, PTCA and cover stenting using a 7 mm x 39 mm long VBX covered stent with an excellent angiographic result. The claudication had somewhat improved as has his Doppler studies. I do not think his SFAs are easily percutaneously addressable.   He continues to complain of some continued claudication.  He continues to smoke as well.  He also complains of some shortness of breath.    He had Dopplers performed 12/21/2019 revealing ABIs of 0.7 range bilaterally with a patent right iliac stent.  I believe he would benefit from the addition of Pletal.  We will explore this with our Pharm.D. to  determine if there are any drug drug interactions.  He is also not at goal on high-dose atorvastatin and may benefit from being on a PCSK9 inhibitor as well.     Since I saw him a year and a half ago he has remained stable.  His claudication has progressed.  He denies chest pain.  Does have COPD with minimal shortness of breath.  He remains in sinus rhythm and is seeing Dr. Elberta Fortis who follows him for this.  His last Doppler studies performed 02/23/2023 revealed a right ABI of 0.55 and a left of 0.39 with monophasic waveforms in his iliac arteries and no high-frequency signals.  Current Meds  Medication Sig   albuterol (VENTOLIN HFA) 108 (90 Base) MCG/ACT inhaler Use  2 inhalations  15 minutes apart  every 4 hours  to rescue Asthma Attack   aspirin EC 81 MG tablet Take 81 mg by mouth daily. Swallow whole.   atorvastatin (LIPITOR) 80 MG tablet Take 1 tablet Daily for Cholesterol   Cholecalciferol 250 MCG (10000 UT) CAPS Take 10,000 Units by mouth daily.    dofetilide (TIKOSYN) 500 MCG capsule Take 1 capsule (500 mcg total) by mouth 2 (two) times daily.   gabapentin (NEURONTIN) 300 MG capsule Take 1 capsule 3 x /day for Neuropathy Pain   pyridOXINE (VITAMIN B-6) 100 MG tablet Take 100 mg by mouth daily.   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  Tobacco Use   Smoking status: Every Day    Current packs/day: 0.50    Average packs/day: 0.5 packs/day for 53.9 years (26.9 ttl pk-yrs)    Types: Cigarettes    Start date:  25   Smokeless tobacco: Never  Vaping Use   Vaping status: Never Used  Substance and Sexual Activity   Alcohol use: Yes    Alcohol/week: 2.0 standard drinks of alcohol    Types: 2 Standard drinks or equivalent per week    Comment: 2 drinks per week    Drug use: Never   Sexual activity: Not on file  Other Topics Concern   Not on file  Social History Narrative   Not on file   Social Determinants of Health   Financial Resource Strain: Not on file  Food Insecurity: Food Insecurity Present (05/20/2021)   Hunger Vital Sign    Worried About Running Out of Food in the Last Year: Sometimes true    Ran Out of Food in the Last Year: Never true  Transportation Needs: Not on file  Physical Activity: Not on file  Stress: Not on file  Social Connections: Not on file  Intimate Partner Violence: Not on file     Review of Systems: General: negative for chills, fever, night sweats or weight changes.  Cardiovascular: negative for chest pain, dyspnea on exertion, edema, orthopnea, palpitations, paroxysmal nocturnal dyspnea or shortness of breath Dermatological: negative for rash Respiratory: negative for cough or wheezing Urologic: negative for hematuria Abdominal: negative for nausea, vomiting, diarrhea, bright red blood per rectum, melena, or hematemesis Neurologic: negative for visual changes, syncope, or dizziness All other systems reviewed and are otherwise negative except as noted above.    Blood pressure 114/70, pulse 88, height 6\' 1"  (1.854 m), weight 178 lb 12.8 oz (81.1 kg), SpO2 96%.  General appearance: alert and no distress Neck: no adenopathy, no carotid bruit, no JVD, supple, symmetrical, trachea midline, and thyroid not enlarged, symmetric, no tenderness/mass/nodules Lungs: clear to auscultation bilaterally Heart: regular rate and rhythm, S1, S2 normal, no murmur, click, rub or gallop Extremities: extremities normal, atraumatic, no cyanosis or edema Pulses: Absent pedal  pulses Skin: Skin color, texture, turgor normal. No rashes or lesions Neurologic: Grossly normal  EKG not performed today      ASSESSMENT AND PLAN:   TOBACCO ABUSE Ongoing tobacco abuse 1 pack/day recalcitrant to risk factor modification.  Atrial fibrillation (HCC) History of paroxysmal atrial fibrillation followed by Dr. Elberta Fortis on Tikosyn and Xarelto.  His last EKG performed 02/09/2023 revealed sinus rhythm with (block.  Hyperlipidemia, mixed History of hyperlipidemia on high-dose statin therapy with lipid profile performed 03/31/2023 revealing total cholesterol 132, LDL 70 and HDL 40.  Claudication in peripheral vascular disease (HCC) History of PAD with known bilateral SFA occlusions three-vessel runoff bilaterally status post orbital atherectomy and VBX stenting of of his calcified subtotally occluded right common iliac artery 04/27/2017.  He did have a 40% left common iliac artery stenosis with a 10 mm gradient.  He says that  since I saw him a year and a half ago his claudication has progressed.  His most recent Dopplers performed 02/23/2023 revealed a right ABI of 0.55 and a left of 0.39.  He does have monophasic waveforms in his iliac arteries but no high-frequency signals.  I am going to get a CTA to further evaluate.  Essential hypertension History of essential hypertension blood pressure measured today 114/70.  He is not on antihypertensive medications.     Runell Gess MD FACP,FACC,FAHA, [facility] 05/18/2023 10:49 AM

## 2023-05-18 NOTE — Assessment & Plan Note (Signed)
History of essential hypertension blood pressure measured today 114/70.  He is not on antihypertensive medications.

## 2023-05-29 ENCOUNTER — Ambulatory Visit (HOSPITAL_BASED_OUTPATIENT_CLINIC_OR_DEPARTMENT_OTHER)
Admission: RE | Admit: 2023-05-29 | Discharge: 2023-05-29 | Disposition: A | Discharge status: Home / Self Care | Payer: PPO | Source: Ambulatory Visit | Attending: Cardiovascular Disease | Admitting: Cardiovascular Disease

## 2023-05-29 DIAGNOSIS — F17200 Nicotine dependence, unspecified, uncomplicated: Secondary | ICD-10-CM | POA: Insufficient documentation

## 2023-05-29 DIAGNOSIS — I701 Atherosclerosis of renal artery: Secondary | ICD-10-CM | POA: Diagnosis not present

## 2023-05-29 DIAGNOSIS — I10 Essential (primary) hypertension: Secondary | ICD-10-CM | POA: Diagnosis not present

## 2023-05-29 DIAGNOSIS — Z95828 Presence of other vascular implants and grafts: Secondary | ICD-10-CM | POA: Diagnosis not present

## 2023-05-29 DIAGNOSIS — I739 Peripheral vascular disease, unspecified: Secondary | ICD-10-CM | POA: Insufficient documentation

## 2023-05-29 DIAGNOSIS — E782 Mixed hyperlipidemia: Secondary | ICD-10-CM | POA: Insufficient documentation

## 2023-05-29 DIAGNOSIS — I70203 Unspecified atherosclerosis of native arteries of extremities, bilateral legs: Secondary | ICD-10-CM | POA: Diagnosis not present

## 2023-05-29 DIAGNOSIS — I4811 Longstanding persistent atrial fibrillation: Secondary | ICD-10-CM | POA: Diagnosis not present

## 2023-05-29 DIAGNOSIS — K5730 Diverticulosis of large intestine without perforation or abscess without bleeding: Secondary | ICD-10-CM | POA: Diagnosis not present

## 2023-05-29 DIAGNOSIS — I7143 Infrarenal abdominal aortic aneurysm, without rupture: Secondary | ICD-10-CM | POA: Diagnosis not present

## 2023-05-29 MED ORDER — IOHEXOL 350 MG/ML SOLN
100.0000 mL | Freq: Once | INTRAVENOUS | Status: AC | PRN
  Administered 2023-05-29: 100 mL via INTRAVENOUS

## 2023-06-16 ENCOUNTER — Ambulatory Visit: Payer: PPO | Attending: Cardiovascular Disease | Admitting: Cardiovascular Disease

## 2023-06-16 ENCOUNTER — Telehealth: Payer: Self-pay | Admitting: Cardiovascular Disease

## 2023-06-16 ENCOUNTER — Encounter: Payer: Self-pay | Admitting: Cardiovascular Disease

## 2023-06-16 VITALS — BP 122/64 | HR 93 | Ht 73.0 in | Wt 176.0 lb

## 2023-06-16 DIAGNOSIS — I739 Peripheral vascular disease, unspecified: Secondary | ICD-10-CM

## 2023-06-16 LAB — CBC WITH DIFFERENTIAL/PLATELET

## 2023-06-16 NOTE — Progress Notes (Signed)
06/16/2023 Bradley Ferguson   1950-09-05  [id number]  Primary Physician [name], MD Primary Cardiologist: Runell Gess MD Nicholes Calamity, [place]  HPI:  INDIE MATKOVICH is a 72 y.o.     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 He was referred by Dr. Oneta Rack for peripheral vascular evaluation because of life limiting claudication. I last saw him in the office 05/18/2023.Marland Kitchen His risk factors include treated hyperlipidemia and 45 pack years of tobacco abuse continued to smoke one pack per day. Never had a heart attack or stroke. He does complain of some dyspnea on exertion probably related to COPD. He's had A. fib ablation back in 2011 by Dr. Graciela Husbands but has not followed up since. He has complained of less limiting claudication over the last year which is symmetric and bilateral. He can walk one block after which she has to stop because of discomfort. He has had lower extremity arterial Doppler studies in our office 04/11/17 revealing a high-frequency signal in his right common iliac artery and a total left SFA.   I performed peripheral angiography on him 04/27/17 revealing a 99% calcified eccentric proximal right common iliac artery stenosis and bilateral long segment SFA chronic total occlusions which were highly calcified. I performed diamondback orbital rotational atherectomy, PTCA and cover stenting using a 7 mm x 39 mm long VBX covered stent with an excellent angiographic result. The claudication had somewhat improved as has his Doppler studies. I do not think his SFAs are easily percutaneously addressable.   He continues to complain of some continued claudication.  He continues to smoke as well.  He also complains of some shortness of breath.    He had Dopplers performed 12/21/2019 revealing ABIs of 0.7 range bilaterally with a patent right iliac stent.  I believe he would benefit from the addition of Pletal.  We will explore this with our Pharm.D. to  determine if there are any drug drug interactions.  He is also not at goal on high-dose atorvastatin and may benefit from being on a PCSK9 inhibitor as well.     Since I saw him in the office 1 month ago I did get a abdominal pelvic CTA that showed a patent iliac stent, confirmed SFA occlusion with potential progression of disease in his common femoral arteries bilaterally.  He has had progressive claudication with a decline in his left ABI from 0.54-0.39.  The Dopplers also suggest occlusion of the left iliac artery and on exam he does not have a left femoral pulse.  Does have a faint right femoral pulse without bruit.  Given his symptoms I favor proceeding with peripheral angiography to assess his anatomy and make further assessment as to his revascularization options.   Current Meds  Medication Sig   albuterol (VENTOLIN HFA) 108 (90 Base) MCG/ACT inhaler Use  2 inhalations  15 minutes apart  every 4 hours  to rescue Asthma Attack   aspirin EC 81 MG tablet Take 81 mg by mouth daily. Swallow whole.   atorvastatin (LIPITOR) 80 MG tablet Take 1 tablet Daily for Cholesterol   Cholecalciferol 250 MCG (10000 UT) CAPS Take 10,000 Units by mouth daily.    dofetilide (TIKOSYN) 500 MCG capsule Take 1 capsule (500 mcg total) by mouth 2 (two) times daily.   gabapentin (NEURONTIN) 300 MG capsule Take 1 capsule 3 x /day for Neuropathy Pain   pyridOXINE (VITAMIN B-6) 100 MG tablet Take 100 mg by  mouth daily.   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  Tobacco Use   Smoking status: Every Day    Current packs/day: 0.50    Average packs/day: 0.5 packs/day for 54.0 years (27.0 ttl pk-yrs)    Types: Cigarettes    Start date: 16   Smokeless tobacco: Never  Vaping Use   Vaping status: Never Used  Substance and Sexual Activity   Alcohol use: Yes    Alcohol/week: 2.0 standard drinks of alcohol    Types: 2 Standard drinks or equivalent per week    Comment: 2 drinks per week    Drug use: Never   Sexual activity: Not on file  Other Topics Concern   Not on file  Social History Narrative   Not on file   Social Drivers of Health   Financial Resource Strain: Not on file  Food Insecurity: Food Insecurity Present (05/20/2021)   Hunger Vital Sign    Worried About Running Out of Food in the Last Year: Sometimes true    Ran Out of Food in the Last Year: Never true  Transportation Needs: Not on file  Physical Activity: Not on file  Stress: Not on file  Social Connections: Not on file  Intimate Partner Violence: Not on file     Review of Systems: General: negative for chills, fever, night sweats or weight changes.  Cardiovascular: negative for chest pain, dyspnea on exertion, edema, orthopnea, palpitations, paroxysmal nocturnal dyspnea or shortness of breath Dermatological: negative for rash Respiratory: negative for cough or wheezing Urologic: negative for hematuria Abdominal: negative for nausea, vomiting, diarrhea, bright red blood per rectum, melena, or hematemesis Neurologic: negative for visual changes, syncope, or dizziness All other systems reviewed and are otherwise negative except as noted above.    Blood pressure 122/64, pulse 93, height 6\' 1"  (1.854 m), weight 176 lb (79.8 kg), SpO2 97%.  General appearance: alert and no distress Neck: no adenopathy, no carotid bruit, no JVD, supple, symmetrical, trachea midline, and thyroid not enlarged, symmetric, no tenderness/mass/nodules Lungs: clear to  auscultation bilaterally Heart: regular rate and rhythm, S1, S2 normal, no murmur, click, rub or gallop Extremities: extremities normal, atraumatic, no cyanosis or edema Pulses: Absent pedal pulses Skin: Skin color, texture, turgor normal. No rashes or lesions Neurologic: Grossly normal  EKG not performed today      ASSESSMENT AND PLAN:   Claudication in peripheral vascular disease [facility]) Mr. Fitzsimmons returns for follow-up.  He has progressive claudication.  I performed covered stenting of a hide ostial right common iliac artery after orbital atherectomy using a VBX 7 mm x 39 mm covered stent.  He had 40% proximal left common iliac artery stenosis with a 10 mm gradient and occluded SFAs bilaterally which were calcified.  He has had progressive claudication with Dopplers performed 02/23/2023 revealing a right ABI of 0.55 and a left of 0.39.  This represents  a significant decline in his ABI on the left.  The Doppler suggested an occluded left iliac artery with monophasic waveforms in both iliacs.  A CTA revealed a patent right common iliac artery stent with atherosclerosis of both common femoral arteries.  There is a significant discrepancy in CT versus Doppler.  It is possible that his collaterals are been compromised versus progression greater than what the CT suggest.  Based on this, I favor repeating angiography to demonstrate his anatomy.     Runell Gess MD FACP,FACC,FAHA, [facility] 06/16/2023 8:59 AM

## 2023-06-16 NOTE — Assessment & Plan Note (Signed)
Bradley Ferguson returns for follow-up.  He has progressive claudication.  I performed covered stenting of a hide ostial right common iliac artery after orbital atherectomy using a VBX 7 mm x 39 mm covered stent.  He had 40% proximal left common iliac artery stenosis with a 10 mm gradient and occluded SFAs bilaterally which were calcified.  He has had progressive claudication with Dopplers performed 02/23/2023 revealing a right ABI of 0.55 and a left of 0.39.  This represents a significant decline in his ABI on the left.  The Doppler suggested an occluded left iliac artery with monophasic waveforms in both iliacs.  A CTA revealed a patent right common iliac artery stent with atherosclerosis of both common femoral arteries.  There is a significant discrepancy in CT versus Doppler.  It is possible that his collaterals are been compromised versus progression greater than what the CT suggest.  Based on this, I favor repeating angiography to demonstrate his anatomy.

## 2023-06-16 NOTE — Telephone Encounter (Signed)
Pt c/o medication issue:  1. Name of Medication:   Wellbutrin  2. How are you currently taking this medication (dosage and times per day)?   3. Are you having a reaction (difficulty breathing--STAT)?   4. What is your medication issue?   Patient stated Dr. Allyson Sabal prescribed new medication (generic Wellbutrin) for him and wants prescription sent to [id number] - [place], [place] - [address]

## 2023-06-16 NOTE — Telephone Encounter (Signed)
Called and spoke to patient. Verified name and DOB. Patient stated that he and Dr Allyson Sabal talked about it during his visit. He said he asked Dr Allyson Sabal if he should contact his primary doctor and Dr Allyson Sabal said he would prescribe it to help him stop smoking before his surgery.

## 2023-06-16 NOTE — Patient Instructions (Addendum)
Medication Instructions:  Your physician recommends that you continue on your current medications as directed. Please refer to the Current Medication list given to you today.  *If you need a refill on your cardiac medications before your next appointment, please call your pharmacy*   Lab Work: Your physician recommends that you labs drawn today: BMET & CBC  If you have labs (blood work) drawn today and your tests are completely normal, you will receive your results only by: MyChart Message (if you have MyChart) OR A paper copy in the mail If you have any lab test that is abnormal or we need to change your treatment, we will call you to review the results.   Testing/Procedures: Dr. Allyson Sabal has ordered a CT coronary calcium score.   Test locations:  [facility]  [facility] at [facility]  This is $99 out of pocket.   Coronary CalciumScan A coronary calcium scan is an imaging test used to look for deposits of calcium and other fatty materials (plaques) in the inner lining of the blood vessels of the heart (coronary arteries). These deposits of calcium and plaques can partly clog and narrow the coronary arteries without producing any symptoms or warning signs. This puts a person at risk for a heart attack. This test can detect these deposits before symptoms develop. Tell a health care provider about: Any allergies you have. All medicines you are taking, including vitamins, herbs, eye drops, creams, and over-the-counter medicines. Any problems you or family members have had with anesthetic medicines. Any blood disorders you have. Any surgeries you have had. Any medical conditions you have. Whether you are pregnant or may be pregnant. What are the risks? Generally, this is a safe procedure. However, problems may occur, including: Harm to a pregnant woman and her unborn baby. This test involves the use of  radiation. Radiation exposure can be dangerous to a pregnant woman and her unborn baby. If you are pregnant, you generally should not have this procedure done. Slight increase in the risk of cancer. This is because of the radiation involved in the test. What happens before the procedure? No preparation is needed for this procedure. What happens during the procedure? You will undress and remove any jewelry around your neck or chest. You will put on a hospital gown. Sticky electrodes will be placed on your chest. The electrodes will be connected to an electrocardiogram (ECG) machine to record a tracing of the electrical activity of your heart. A CT scanner will take pictures of your heart. During this time, you will be asked to lie still and hold your breath for 2-3 seconds while a picture of your heart is being taken. The procedure may vary among health care providers and hospitals. What happens after the procedure? You can get dressed. You can return to your normal activities. It is up to you to get the results of your test. Ask your health care provider, or the department that is doing the test, when your results will be ready. Summary A coronary calcium scan is an imaging test used to look for deposits of calcium and other fatty materials (plaques) in the inner lining of the blood vessels of the heart (coronary arteries). Generally, this is a safe procedure. Tell your health care provider if you are pregnant or may be pregnant. No preparation is needed for this procedure. A CT scanner will take pictures of your heart. You can return to your normal  activities after the scan is done. This information is not intended to replace advice given to you by your health care provider. Make sure you discuss any questions you have with your health care provider. Document Released: 12/13/2007 Document Revised: 05/05/2016 Document Reviewed: 05/05/2016 Elsevier Interactive Patient Education  2017 [employer].    Your physician has requested that you have an Aorta/Iliac Duplex. This will be take place at [address] [facility], [address].  No food after 11PM the night before.  Water is OK. (Don't drink liquids if you have been instructed not to for ANOTHER test) Avoid foods that produce bowel gas, for 24 hours prior to exam (see below). No breakfast, no chewing gum, no smoking or carbonated beverages. Patient may take morning medications with water. Come in for test at least 15 minutes early to register. To do 1-2 weeks after your procedure (1/9)  Please note: We ask at that you not bring children with you during ultrasound (echo/ vascular) testing. Due to room size and safety concerns, children are not allowed in the ultrasound rooms during exams. Our front office staff cannot provide observation of children in our lobby area while testing is being conducted. An adult accompanying a patient to their appointment will only be allowed in the ultrasound room at the discretion of the ultrasound technician under special circumstances. We apologize for any inconvenience.  Your physician has requested that you have an ankle brachial index (ABI). During this test an ultrasound and blood pressure cuff are used to evaluate the arteries that supply the arms and legs with blood. Allow thirty minutes for this exam. There are no restrictions or special instructions. This will take place at [address] [facility], [address]. To do 1-2 weeks after your procedure (1/9)   Please note: We ask at that you not bring children with you during ultrasound (echo/ vascular) testing. Due to room size and safety concerns, children are not allowed in the ultrasound rooms during exams. Our front office staff cannot provide observation of children in our lobby area while testing is being conducted. An adult accompanying a patient to their appointment will only be allowed in the ultrasound room at the discretion of the ultrasound  technician under special circumstances. We apologize for any inconvenience.    Follow-Up: At [facility], you and your health needs are our priority.  As part of our continuing mission to provide you with exceptional heart care, we have created designated Provider Care Teams.  These Care Teams include your primary Cardiologist (physician) and Advanced Practice Providers (APPs -  Physician Assistants and Nurse Practitioners) who all work together to provide you with the care you need, when you need it.  We recommend signing up for the patient portal called "MyChart".  Sign up information is provided on this After Visit Summary.  MyChart is used to connect with patients for Virtual Visits (Telemedicine).  Patients are able to view lab/test results, encounter notes, upcoming appointments, etc.  Non-urgent messages can be sent to your provider as well.   To learn more about what you can do with MyChart, go to [email or web address].    Your next appointment:   2-3 week(s) after procedure (1/9)  Provider:   Nanetta Batty, MD     Other Instructions        Cardiac/Peripheral Catheterization   You are scheduled for a Peripheral Angiogram on Thursday, January 9 with Dr. Nanetta Batty.  1. Please arrive at the [facility] (Main Entrance A) at  [facility]: [address] [place], [place] [address] at 5:30 AM (This time is 2 hour(s) before your procedure to ensure your preparation).   Free valet parking service is available. You will check in at ADMITTING. The support person will be asked to wait in the waiting room.  It is OK to have someone drop you off and come back when you are ready to be discharged.        Special note: Every effort is made to have your procedure done on time. Please understand that emergencies sometimes delay scheduled procedures.  2. Diet: Do not eat solid foods after midnight.  You may have clear liquids until 5 AM the day of the procedure.  3.  Labs: You will need to have blood drawn today (12/17)  4. Medication instructions in preparation for your procedure:   Stop taking Xarelto (Rivaroxaban) on Tuesday, January 15.   On the morning of your procedure, take Aspirin 81 mg and any morning medicines NOT listed above.  You may use sips of water.  5. Plan to go home the same day, you will only stay overnight if medically necessary. 6. You MUST have a responsible adult to drive you home. 7. An adult MUST be with you the first 24 hours after you arrive home. 8. Bring a current list of your medications, and the last time and date medication taken. 9. Bring ID and current insurance cards. 10.Please wear clothes that are easy to get on and off and wear slip-on shoes.  Thank you for allowing [place] to care for you!   -- [facility]

## 2023-06-17 LAB — BASIC METABOLIC PANEL
BUN/Creatinine Ratio: 14 (ref 10–24)
BUN: 14 mg/dL (ref 8–27)
CO2: 22 mmol/L (ref 20–29)
Calcium: 9.5 mg/dL (ref 8.6–10.2)
Chloride: 103 mmol/L (ref 96–106)
Creatinine, Ser: 0.97 mg/dL (ref 0.76–1.27)
Glucose: 100 mg/dL — ABNORMAL HIGH (ref 70–99)
Potassium: 4.7 mmol/L (ref 3.5–5.2)
Sodium: 141 mmol/L (ref 134–144)
eGFR: 83 mL/min/{1.73_m2} (ref >59)

## 2023-06-17 LAB — CBC WITH DIFFERENTIAL/PLATELET
Basos: 1 %
EOS (ABSOLUTE): 0.1 10*3/uL (ref 0.0–0.2)
Eos: 5 %
Hematocrit: 44.2 % (ref 37.5–51.0)
Hemoglobin: 14.4 g/dL (ref 13.0–17.7)
Immature Granulocytes: 0 10*3/uL (ref 0.0–0.1)
Immature Granulocytes: 1 %
Lymphs: 31 %
MCH: 30.4 pg (ref 26.6–33.0)
MCHC: 32.6 g/dL (ref 31.5–35.7)
MCV: 93 fL (ref 79–97)
Monocytes Absolute: 0.4 10*3/uL (ref 0.0–0.4)
Monocytes Absolute: 0.7 10*3/uL (ref 0.1–0.9)
Monocytes: 9 %
Neutrophils Absolute: 2.2 10*3/uL (ref 0.7–3.1)
Neutrophils Absolute: 3.9 10*3/uL (ref 1.4–7.0)
Neutrophils: 53 %
Platelets: 180 10*3/uL (ref 150–450)
RBC: 4.74 x10E6/uL (ref 4.14–5.80)
RDW: 13.4 % (ref 11.6–15.4)
WBC: 7.2 10*3/uL (ref 3.4–10.8)

## 2023-06-17 NOTE — Telephone Encounter (Signed)
Dr Allyson Sabal can you clarify dose and frequency.

## 2023-06-17 NOTE — Telephone Encounter (Signed)
Welbutin Sr 150mg  by mouth daily for 3 days and then 1 tablet BID. This is on pt allergy list as it makes him "sleepy" Please confirm he wants to try again. Order is pended

## 2023-06-18 MED ORDER — BUPROPION HCL ER (SR) 150 MG PO TB12: ORAL_TABLET | ORAL | 2 refills | Status: DC

## 2023-06-18 NOTE — Telephone Encounter (Signed)
Called and spoke to patient. Verified name and DOB. I told patient Wellbutrin was on his allergy list as making him sleepy. I asked patient if he still wanted to take medication. Patient replied " Yes I said that because I did not want to take it any more" He stated it was not an allergy and he wants to take it. Prescription sent to [facility] [address].

## 2023-07-03 ENCOUNTER — Other Ambulatory Visit: Payer: Self-pay

## 2023-07-03 DIAGNOSIS — I739 Peripheral vascular disease, unspecified: Secondary | ICD-10-CM

## 2023-07-06 ENCOUNTER — Ambulatory Visit (INDEPENDENT_AMBULATORY_CARE_PROVIDER_SITE_OTHER): Payer: PPO | Admitting: Internal Medicine

## 2023-07-06 ENCOUNTER — Encounter: Payer: Self-pay | Admitting: Internal Medicine

## 2023-07-06 VITALS — BP 136/72 | HR 96 | Temp 97.9°F | Resp 16 | Ht 73.0 in | Wt 177.2 lb

## 2023-07-06 DIAGNOSIS — I480 Paroxysmal atrial fibrillation: Secondary | ICD-10-CM | POA: Diagnosis not present

## 2023-07-06 DIAGNOSIS — I10 Essential (primary) hypertension: Secondary | ICD-10-CM | POA: Diagnosis not present

## 2023-07-06 DIAGNOSIS — E782 Mixed hyperlipidemia: Secondary | ICD-10-CM | POA: Diagnosis not present

## 2023-07-06 DIAGNOSIS — I700 Atherosclerosis of aorta: Secondary | ICD-10-CM | POA: Diagnosis not present

## 2023-07-06 DIAGNOSIS — Z79899 Other long term (current) drug therapy: Secondary | ICD-10-CM | POA: Diagnosis not present

## 2023-07-06 DIAGNOSIS — R7309 Other abnormal glucose: Secondary | ICD-10-CM | POA: Diagnosis not present

## 2023-07-06 DIAGNOSIS — E559 Vitamin D deficiency, unspecified: Secondary | ICD-10-CM

## 2023-07-06 NOTE — Patient Instructions (Signed)

## 2023-07-06 NOTE — Progress Notes (Signed)
 [place]      ADULT   &   ADOLESCENT      INTERNAL MEDICINE  Elsie Richards, M.D.          Lonell Rous, ANP        Bascom Necessary, FNP  [facility] [address]  [place], [place]. [address] Telephone [phone] Telefax [phone]   Future Appointments  Date Time Provider Department  07/06/2023                     6 mo ov  2:30 PM Richards Elsie, MD [facility]  07/29/2023 10:15 AM Court Dorn PARAS, MD [facility]  10/05/2023                    wellness  2:30 PM Jude Lonell BRAVO, NP [facility]  11/02/2023  1:30 PM Nellene Bienenstock R, PA [facility]  01/05/2024                     cpe 11:00 AM Richards Elsie, MD [facility]    History of Present Illness:       This very nice 73 y.o. [name] with HTN, ASHD/pAfib, ASPVD, HLD, Prediabetes,Vitamin D  Deficiency, BipolarDisorder /Depression,  Systemic Sclerosis and  Interstitial Pulm Fibrosis presents for 6  month follow up.  CT scan in 2021 showed Aortic Atherosclerosis.        Patient is treated for HTN circa  1996 & BP has been controlled at home. Today's BP is at goal - 136/72 .   In 1996 , patient underwent an ablation for Afib by Dr Fernande.  In 2018, he had PCA  & Arthrotomy of a Rt Common Iliac stenosis w/Stent implanted by Dr Court.  In 2019, he was hospitalized w/Afib (CHADsVASc 4) and started on Tykosyn & Xarelto .    Patient has had no complaints of any cardiac type chest pain, palpitations, dyspnea chet /PND, dizziness, claudication  or dependent edema.       Hyperlipidemia is controlled with diet & Atorvastatin  . Patient denies myalgias or other med SE's. Last Lipids were at goal :  Lab Results  Component Value Date   CHOL 132 03/31/2023   HDL 40 03/31/2023   LDLCALC 70 03/31/2023   TRIG 135 03/31/2023   CHOLHDL 3.3 03/31/2023     Also, the patient has history of PreDiabetes (A1c 5.9% /2017)  and has had no symptoms of reactive hypoglycemia, diabetic polys, paresthesias or visual blurring.   Last A1c was Not at goal :  Lab Results  Component Value Date   HGBA1C 6.0 (H) 12/25/2022                                                     Further, the patient also has history of Vitamin D  Deficiency (A1c 5.9% /2017)  and supplements vitamin D  . Last vitamin D  was sl low  (goal 70-100) :  Lab Results  Component Value Date   VD25OH 49 12/25/2022       Current Outpatient Medications  Medication Instructions   albuterol    HFA  inhaler Use  2 inhalations   every 4 hrs  to rescue Asthma    aspirin  EC81 mg Daily   atorvastatin   80 MG tablet Take 1 tablet Daily for Cholesterol   buPROPion   SR  150 MG 12 hr  1 tablet twice a day.   Cholecalciferol   10,000 Units Daily   TIKOSYN   500 mcg 2 times daily   gabapentin  300 MG capsule Take 1 capsule 3 x /day for Neuropathy Pain   ketoconazole  2 % cream 1 Application  Daily PRN   VITAMIN B6  100 mg,  Daily   XARELTO  20 MG TABS  Take 1 tablet Daily    triamcinolone  cream 0.5 % 1 Application, Daily PRN     Allergies  Allergen Reactions   Cymbalta  [Duloxetine  Hcl]    Effexor [Venlafaxine]     Feels bad   Wellbutrin  [Bupropion ]     sleepy   Zoloft [Sertraline Hcl]     Felt bad     PMHx:   Past Medical History:  Diagnosis Date   Anxiety    Atrial fibrillation (HCC)    Depression    PVD (peripheral vascular disease) (HCC)    Raynaud's syndrome    Systemic sclerosis (HCC)    TB lung, latent 12/19/2019   Transaminitis 06/18/2020     Immunization History  Administered Date(s) Administered   Influenza Whole 04/30/2010   Influenza, High Dose Seas 04/06/2019, 04/30/2020, 05/08/2021   Moderna Sars-Covid-2 Vacc 05/12/2020   PFIZER  SARS-COV-2 Vacc 08/04/2019, 08/29/2019   Pneumococcal -13 02/05/2017   Pneumococcal -23 04/30/2010, 03/29/2018   Td 06/30/2013     Past Surgical History:  Procedure Laterality Date   ATRIAL FIBRILLATION ABLATION     CATARACT EXTRACTION Right    COLONOSCOPY     EYE SURGERY     FRACTURE  SURGERY Left 1976   INGUINAL HERNIA REPAIR Right 03/23/2020   Procedure: OPEN RIGHT INGUINAL HERNIA REPAIR WITH MESH;  Surgeon: Gladis Cough, MD;  Location[facility] OR;  Service: General;  Laterality: Right;   INSERTION OF MESH Right 03/23/2020   Procedure: INSERTION OF MESH;  Surgeon: Gladis Cough, MD;  Location: [facility] OR;  Service: General;  Laterality: Right;   LOWER EXTREMITY INTERVENTION N/A 04/27/2017   Procedure: LOWER EXTREMITY INTERVENTION;  Surgeon: Court Dorn PARAS, MD;  Location: [facility] INVASIVE CV LAB;  Service: Cardiovascular;  Laterality: N/A;   PERIPHERAL VASCULAR ATHERECTOMY Right 04/27/2017   Procedure: PERIPHERAL VASCULAR ATHERECTOMY;  Surgeon: Court Dorn PARAS, MD;  Location: [facility] INVASIVE CV LAB;  Service: Cardiovascular;  Laterality: Right;  Iliac   PERIPHERAL VASCULAR INTERVENTION Right 04/27/2017   Procedure: PERIPHERAL VASCULAR INTERVENTION;  Surgeon: Court Dorn PARAS, MD;  Location: [facility] INVASIVE CV LAB;  Service: Cardiovascular;  Laterality: Right;  Iliac    FHx:    Reviewed / unchanged  SHx:    Reviewed / unchanged   Systems Review:  Constitutional: Denies fever, chills, wt changes, headaches, insomnia, fatigue, night sweats, change in appetite. Eyes: Denies redness, blurred vision, diplopia, discharge, itchy, watery eyes.  ENT: Denies discharge, congestion, post nasal drip, epistaxis, sore throat, earache, hearing loss, dental pain, tinnitus, vertigo, sinus pain, snoring.  CV: Denies chest pain, palpitations, irregular heartbeat, syncope, dyspnea, diaphoresis, orthopnea, PND, claudication or edema. Respiratory: denies cough, dyspnea, DOE, pleurisy, hoarseness, laryngitis, wheezing.  Gastrointestinal: Denies dysphagia, odynophagia, heartburn, reflux, water  brash, abdominal pain or cramps, nausea, vomiting, bloating, diarrhea, constipation, hematemesis, melena, hematochezia  or hemorrhoids. Genitourinary: Denies dysuria, frequency, urgency, nocturia, hesitancy, discharge,  hematuria or flank pain. Musculoskeletal: Denies arthralgias, myalgias, stiffness, jt. swelling, pain, limping or strain/sprain.  Skin: Denies pruritus, rash, hives, warts, acne, eczema or change in skin lesion(s). Neuro: No weakness, tremor, incoordination, spasms, paresthesia or pain. Psychiatric:  Denies confusion, memory loss or sensory loss. Endo: Denies change in weight, skin or hair change.  Heme/Lymph: No excessive bleeding, bruising or enlarged lymph nodes.  Physical Exam  BP 136/72   Pulse 96   Temp 97.9 F (36.6 C)   Resp 16   Ht 6' 1 (1.854 m)   Wt 177 lb 3.2 oz (80.4 kg)   SpO2 96%   BMI 23.38 kg/m   Appears  well nourished, well groomed  and in no distress.  Eyes: PERRLA, EOMs, conjunctiva no swelling or erythema. Sinuses: No frontal/maxillary tenderness ENT/Mouth: EAC's clear, TM's nl w/o erythema, bulging. Nares clear w/o erythema, swelling, exudates. Oropharynx clear without erythema or exudates. Oral hygiene is good. Tongue normal, non obstructing. Hearing intact.  Neck: Supple. Thyroid  not palpable. Car 2+/2+ without bruits, nodes or JVD. Chest: Respirations nl with BS clear & equal w/o rales, rhonchi, wheezing or stridor.  Cor: Heart sounds normal w/ regular rate and rhythm without sig. murmurs, gallops, clicks or rubs. Peripheral pulses normal and equal  without edema.  Abdomen: Soft & bowel sounds normal. Non-tender w/o guarding, rebound, hernias, masses or organomegaly.  Lymphatics: Unremarkable.  Musculoskeletal: Full ROM all peripheral extremities, joint stability, 5/5 strength and normal gait.  Skin: Warm, dry without exposed rashes, lesions or ecchymosis apparent.  Neuro: Cranial nerves intact, reflexes equal bilaterally. Sensory-motor testing grossly intact. Tendon reflexes grossly intact.  Pysch: Alert & oriented x 3.  Insight and judgement nl & appropriate. No ideations.  Assessment and Plan:   1. Essential hypertension (Primary)  - CBC with  Differential/Platelet - COMPLETE METABOLIC PANEL WITH GFR - Magnesium  - TSH   2. Hyperlipidemia, mixed  - Lipid panel - TSH   3. Abnormal glucose  - Hemoglobin A1c - Insulin , random   4. Vitamin D  deficiency  - VITAMIN D  25 Hydroxy    5. Paroxysmal atrial fibrillation (HCC)  - TSH   6. Atherosclerosis of aorta (HCC) by Chest CT scan on 01/19/2020  - Lipid panel   7. Medication management  - CBC with Differential/Platelet - COMPLETE METABOLIC PANEL WITH GFR - Magnesium  - Lipid panel - TSH - Hemoglobin A1c - Insulin , random - VITAMIN D  25 Hydroxy           Discussed  regular exercise, BP monitoring, weight control to achieve/maintain BMI less than 25 and discussed med and SE's. Recommended labs to assess /monitor clinical status .  I discussed the assessment and treatment plan with the patient. The patient was provided an opportunity to ask questions and all were answered. The patient agreed with the plan and demonstrated an understanding of the instructions.  I provided over 30 minutes of exam, counseling, chart review and  complex critical decision making.        The patient was advised to call back or seek an in-person evaluation if the symptoms worsen or if the condition fails to improve as anticipated.   Elsie JONETTA Richards, MD .

## 2023-07-07 ENCOUNTER — Ambulatory Visit (HOSPITAL_COMMUNITY)
Admission: RE | Admit: 2023-07-07 | Discharge: 2023-07-07 | Disposition: A | Discharge status: Home / Self Care | Payer: Self-pay | Source: Ambulatory Visit | Attending: Cardiovascular Disease | Admitting: Cardiovascular Disease

## 2023-07-07 ENCOUNTER — Telehealth: Payer: Self-pay | Admitting: *Deleted

## 2023-07-07 ENCOUNTER — Ambulatory Visit: Payer: PPO | Admitting: Cardiovascular Disease

## 2023-07-07 DIAGNOSIS — I739 Peripheral vascular disease, unspecified: Secondary | ICD-10-CM | POA: Insufficient documentation

## 2023-07-07 LAB — CBC WITH DIFFERENTIAL/PLATELET
Absolute Lymphocytes: 1779 {cells}/uL (ref 850–3900)
Absolute Monocytes: 440 {cells}/uL (ref 200–950)
Basophils Absolute: 62 {cells}/uL (ref 0–200)
Basophils Relative: 1 %
Eosinophils Absolute: 260 {cells}/uL (ref 15–500)
Eosinophils Relative: 4.2 %
HCT: 43.9 % (ref 38.5–50.0)
Hemoglobin: 14.6 g/dL (ref 13.2–17.1)
MCH: 30.5 pg (ref 27.0–33.0)
MCHC: 33.3 g/dL (ref 32.0–36.0)
MCV: 91.8 fL (ref 80.0–100.0)
MPV: 11.2 fL (ref 7.5–12.5)
Monocytes Relative: 7.1 %
Neutro Abs: 3658 {cells}/uL (ref 1500–7800)
Neutrophils Relative %: 59 %
Platelets: 185 10*3/uL (ref 140–400)
RBC: 4.78 10*6/uL (ref 4.20–5.80)
RDW: 13 % (ref 11.0–15.0)
Total Lymphocyte: 28.7 %
WBC: 6.2 10*3/uL (ref 3.8–10.8)

## 2023-07-07 LAB — COMPLETE METABOLIC PANEL WITH GFR
AG Ratio: 1.6 (calc) (ref 1.0–2.5)
ALT: 13 U/L (ref 9–46)
AST: 19 U/L (ref 10–35)
Albumin: 4.5 g/dL (ref 3.6–5.1)
Alkaline phosphatase (APISO): 81 U/L (ref 35–144)
BUN: 12 mg/dL (ref 7–25)
CO2: 26 mmol/L (ref 20–32)
Calcium: 9.6 mg/dL (ref 8.6–10.3)
Chloride: 103 mmol/L (ref 98–110)
Creat: 0.97 mg/dL (ref 0.70–1.28)
Globulin: 2.9 g/dL (ref 1.9–3.7)
Glucose, Bld: 104 mg/dL — ABNORMAL HIGH (ref 65–99)
Potassium: 4.4 mmol/L (ref 3.5–5.3)
Sodium: 140 mmol/L (ref 135–146)
Total Bilirubin: 0.8 mg/dL (ref 0.2–1.2)
Total Protein: 7.4 g/dL (ref 6.1–8.1)
eGFR: 83 mL/min/{1.73_m2} (ref >=60)

## 2023-07-07 LAB — HEMOGLOBIN A1C
Hgb A1c MFr Bld: 5.9 %{Hb} — ABNORMAL HIGH (ref <5.7)
Mean Plasma Glucose: 123 mg/dL
eAG (mmol/L): 6.8 mmol/L

## 2023-07-07 LAB — LIPID PANEL
Cholesterol: 160 mg/dL (ref <200)
HDL: 45 mg/dL (ref >=40)
LDL Cholesterol (Calc): 90 mg/dL
Non-HDL Cholesterol (Calc): 115 mg/dL (ref <130)
Total CHOL/HDL Ratio: 3.6 (calc) (ref <5.0)
Triglycerides: 148 mg/dL (ref <150)

## 2023-07-07 LAB — TSH: TSH: 2.35 m[IU]/L (ref 0.40–4.50)

## 2023-07-07 LAB — MAGNESIUM: Magnesium: 2.1 mg/dL (ref 1.5–2.5)

## 2023-07-07 LAB — VITAMIN D 25 HYDROXY (VIT D DEFICIENCY, FRACTURES): Vit D, 25-Hydroxy: 37 ng/mL (ref 30–100)

## 2023-07-07 LAB — INSULIN, RANDOM: Insulin: 11.5 u[IU]/mL

## 2023-07-07 NOTE — Progress Notes (Signed)
[][][][][][][][][][][][][][][][][][][][][][][][][][][][][][][][][][][][][][][][][]][][][][][][][][][][][][][][][][][][][][][][][[][][][][]  [][][][][][][][][][][][][][][][][][][][][][][][][][][][][][][][][][][][][][][][][]][][][][][][][][][][][][][][][][][][][][][][][[][][][][]  -  Test results slightly outside the reference range are not unusual. If there is anything important, I will review this with you,  otherwise it is considered normal test values.  If you have further questions,  please do not hesitate to contact me at the office or via My Chart.   [] [] [] [] [] [] [] [] [] [] [] [] [] [] [] [] [] [] [] [] [] [] [] [] [] [] [] [] [] [] [] [] [] [] [] [] [] [] [] [] [] ][] [] [] [] [] [] [] [] [] [] [] [] [] [] [] [] [] [] [] [] [] [] [[] [] [] [] []   [] [] [] [] [] [] [] [] [] [] [] [] [] [] [] [] [] [] [] [] [] [] [] [] [] [] [] [] [] [] [] [] [] [] [] [] [] [] [] [] [] ][] [] [] [] [] [] [] [] [] [] [] [] [] [] [] [] [] [] [] [] [] [] [[] [] [] [] []   -  A1c = 5.9% - Still borderline or slightly elevated glucose     So  - Avoid Sweets, Candy & White Stuff   - White Rice, White Potatoes, White Flour  - Breads &  Pasta  [] [] [] [] [] [] [] [] [] [] [] [] [] [] [] [] [] [] [] [] [] [] [] [] [] [] [] [] [] [] [] [] [] [] [] [] [] [] [] [] [] ][] [] [] [] [] [] [] [] [] [] [] [] [] [] [] [] [] [] [] [] [] [] [[] [] [] [] []   -  Chol =160    Great !  [] [] [] [] [] [] [] [] [] [] [] [] [] [] [] [] [] [] [] [] [] [] [] [] [] [] [] [] [] [] [] [] [] [] [] [] [] [] [] [] [] ][] [] [] [] [] [] [] [] [] [] [] [] [] [] [] [] [] [] [] [] [] [] [[] [] [] [] []   -  Vitamin D  = 37 - Extremely & Dangerously LOW  !   - Vitamin D  goal is between 70-100.   - Please make sure that you are taking your Vitamin D  10,000 units  / day  as directed.   - It is very important as a natural anti-inflammatory and helping the                          immune system protect against viral infections, like Flu  & the Covid    - Also helps hair, skin, and nails, as well as reducing stroke and heart attack risk.   - It helps your bones  &  and helps with mood.  - It also decreases numerous cancer risks, so please                                                                                            take it as directed.   - Low Vit D is associated with a 200-300% higher risk for CANCER   and 200-300% higher risk for HEART   ATTACK  &  STROKE.    - It is also associated with higher death rate at younger ages,   autoimmune diseases like Rheumatoid arthritis, Lupus, Multiple Sclerosis.     - Also many other serious conditions, like depression, Alzheimer's Dementia                                                                             muscle aches, fatigue, fibromyalgia   [] [] [] [] [] [] [] [] [] [] [] [] [] [] [] [] [] [] [] [] [] [] [] [] [] [] [] [] [] [] [] [] [] [] [] [] [] [] [] [] [] ][] [] [] [] [] [] [] [] [] [] [] [] [] [] [] [] [] [] [] [] [] [] [[] [] [] [] []   - All Else - CBC - Kidneys - Electrolytes - Liver - Magnesium  & Thyroid     - all  Normal / OK  [] [] [] [] [] [] [] [] [] [] [] [] [] [] [] [] [] [] [] [] [] [] [] [] [] [] [] [] [] [] [] [] [] [] [] [] [] [] [] [] [] ][] [] [] [] [] [] [] [] [] [] [] [] [] [] [] [] [] [] [] [] [] [] [[] [] [] [] []

## 2023-07-07 NOTE — Telephone Encounter (Signed)
 Cardiac Catheterization scheduled at [facility] for: Thursday July 09, 2023 7:30 AM Arrival time [facility] Main Entrance A at: 5:30 AM  Nothing to eat after midnight prior to procedure, clear liquids until 5 AM day of procedure.  Medication instructions: -Hold:  Xarelto -none 07/07/23 until post procedure -Other usual morning medications can be taken with sips of water  including aspirin  81 mg.  Plan to go home the same day, you will only stay overnight if medically necessary.  You must have responsible adult to drive you home.  Someone must be with you the first 24 hours after you arrive home.  Reviewed procedure instructions with patient.

## 2023-07-09 ENCOUNTER — Ambulatory Visit (HOSPITAL_COMMUNITY)
Admission: RE | Admit: 2023-07-09 | Discharge: 2023-07-09 | Disposition: A | Discharge status: Home / Self Care | Payer: PPO | Attending: Cardiovascular Disease | Admitting: Cardiovascular Disease

## 2023-07-09 ENCOUNTER — Encounter (HOSPITAL_COMMUNITY)
Admission: RE | Disposition: A | Discharge status: Home / Self Care | Payer: Self-pay | Source: Home / Self Care | Attending: Cardiovascular Disease

## 2023-07-09 ENCOUNTER — Other Ambulatory Visit: Payer: Self-pay

## 2023-07-09 DIAGNOSIS — I708 Atherosclerosis of other arteries: Secondary | ICD-10-CM | POA: Diagnosis not present

## 2023-07-09 DIAGNOSIS — E785 Hyperlipidemia, unspecified: Secondary | ICD-10-CM | POA: Insufficient documentation

## 2023-07-09 DIAGNOSIS — Z79899 Other long term (current) drug therapy: Secondary | ICD-10-CM | POA: Insufficient documentation

## 2023-07-09 DIAGNOSIS — I4891 Unspecified atrial fibrillation: Secondary | ICD-10-CM | POA: Insufficient documentation

## 2023-07-09 DIAGNOSIS — I70213 Atherosclerosis of native arteries of extremities with intermittent claudication, bilateral legs: Secondary | ICD-10-CM | POA: Diagnosis present

## 2023-07-09 DIAGNOSIS — F17210 Nicotine dependence, cigarettes, uncomplicated: Secondary | ICD-10-CM | POA: Diagnosis not present

## 2023-07-09 DIAGNOSIS — R0609 Other forms of dyspnea: Secondary | ICD-10-CM | POA: Diagnosis not present

## 2023-07-09 DIAGNOSIS — J449 Chronic obstructive pulmonary disease, unspecified: Secondary | ICD-10-CM | POA: Diagnosis not present

## 2023-07-09 DIAGNOSIS — I739 Peripheral vascular disease, unspecified: Secondary | ICD-10-CM

## 2023-07-09 HISTORY — PX: ABDOMINAL AORTOGRAM W/LOWER EXTREMITY: CATH118223

## 2023-07-09 SURGERY — ABDOMINAL AORTOGRAM W/LOWER EXTREMITY
Anesthesia: LOCAL | Laterality: Left

## 2023-07-09 MED ORDER — MIDAZOLAM HCL 2 MG/2ML IJ SOLN
INTRAMUSCULAR | Status: AC
  Filled 2023-07-09: qty 2

## 2023-07-09 MED ORDER — SODIUM CHLORIDE 0.9 % IV SOLN: INTRAVENOUS | Status: AC

## 2023-07-09 MED ORDER — SODIUM CHLORIDE 0.9 % WEIGHT BASED INFUSION: 3.0000 mL/kg/h | INTRAVENOUS | Status: DC

## 2023-07-09 MED ORDER — HEPARIN (PORCINE) IN NACL 1000-0.9 UT/500ML-% IV SOLN
INTRAVENOUS | Status: DC | PRN
  Administered 2023-07-09: 500 mL

## 2023-07-09 MED ORDER — ASPIRIN 81 MG PO TBEC: 81.0000 mg | DELAYED_RELEASE_TABLET | Freq: Every day | ORAL | Status: DC

## 2023-07-09 MED ORDER — SODIUM CHLORIDE 0.9 % WEIGHT BASED INFUSION: 1.0000 mL/kg/h | INTRAVENOUS | Status: DC

## 2023-07-09 MED ORDER — ALBUTEROL SULFATE HFA 108 (90 BASE) MCG/ACT IN AERS: 2.0000 | INHALATION_SPRAY | Freq: Four times a day (QID) | RESPIRATORY_TRACT | Status: DC

## 2023-07-09 MED ORDER — SODIUM CHLORIDE 0.9 % IV SOLN
250.0000 mL | INTRAVENOUS | Status: DC | PRN
Start: 2023-07-09 — End: 2023-07-09

## 2023-07-09 MED ORDER — RIVAROXABAN 20 MG PO TABS: 20.0000 mg | ORAL_TABLET | Freq: Every day | ORAL | Status: DC

## 2023-07-09 MED ORDER — ACETAMINOPHEN 325 MG PO TABS: 650.0000 mg | ORAL_TABLET | ORAL | Status: DC | PRN

## 2023-07-09 MED ORDER — ASPIRIN 81 MG PO CHEW: 81.0000 mg | CHEWABLE_TABLET | ORAL | Status: DC

## 2023-07-09 MED ORDER — IODIXANOL 320 MG/ML IV SOLN
INTRAVENOUS | Status: DC | PRN
  Administered 2023-07-09: 110 mL

## 2023-07-09 MED ORDER — FENTANYL CITRATE (PF) 100 MCG/2ML IJ SOLN
INTRAMUSCULAR | Status: AC
  Filled 2023-07-09: qty 2

## 2023-07-09 MED ORDER — FENTANYL CITRATE (PF) 100 MCG/2ML IJ SOLN
INTRAMUSCULAR | Status: DC | PRN
  Administered 2023-07-09 (×2): 25 ug via INTRAVENOUS

## 2023-07-09 MED ORDER — SODIUM CHLORIDE 0.9% FLUSH: 3.0000 mL | Freq: Two times a day (BID) | INTRAVENOUS | Status: DC

## 2023-07-09 MED ORDER — ONDANSETRON HCL 4 MG/2ML IJ SOLN
4.0000 mg | Freq: Four times a day (QID) | INTRAMUSCULAR | Status: DC | PRN
Start: 2023-07-09 — End: 2023-07-09

## 2023-07-09 MED ORDER — MIDAZOLAM HCL 2 MG/2ML IJ SOLN
INTRAMUSCULAR | Status: DC | PRN
  Administered 2023-07-09 (×2): 1 mg via INTRAVENOUS

## 2023-07-09 MED ORDER — LIDOCAINE HCL (PF) 1 % IJ SOLN
INTRAMUSCULAR | Status: DC | PRN
  Administered 2023-07-09: 18 mL

## 2023-07-09 MED ORDER — ATORVASTATIN CALCIUM 80 MG PO TABS: 80.0000 mg | ORAL_TABLET | Freq: Every day | ORAL | Status: DC

## 2023-07-09 MED ORDER — LIDOCAINE HCL (PF) 1 % IJ SOLN
INTRAMUSCULAR | Status: AC
  Filled 2023-07-09: qty 30

## 2023-07-09 MED ORDER — LABETALOL HCL 5 MG/ML IV SOLN: 10.0000 mg | INTRAVENOUS | Status: DC | PRN

## 2023-07-09 MED ORDER — SODIUM CHLORIDE 0.9% FLUSH
3.0000 mL | INTRAVENOUS | Status: DC | PRN
Start: 2023-07-09 — End: 2023-07-09

## 2023-07-09 MED ORDER — MORPHINE SULFATE (PF) 2 MG/ML IV SOLN: 2.0000 mg | INTRAVENOUS | Status: DC | PRN

## 2023-07-09 MED ORDER — HYDRALAZINE HCL 20 MG/ML IJ SOLN: 5.0000 mg | INTRAMUSCULAR | Status: DC | PRN

## 2023-07-09 MED ORDER — CHOLECALCIFEROL 250 MCG (10000 UT) PO CAPS: 10000.0000 [IU] | ORAL_CAPSULE | Freq: Every day | ORAL | Status: DC

## 2023-07-09 SURGICAL SUPPLY — 10 items
BAG SNAP BAND KOVER 36X36 (MISCELLANEOUS) IMPLANT
CATH ANGIO 5F PIGTAIL 65CM (CATHETERS) IMPLANT
CATH CROSS OVER TEMPO 5F (CATHETERS) IMPLANT
COVER DOME SNAP 22 D (MISCELLANEOUS) IMPLANT
KIT SYRINGE INJ CVI SPIKEX1 (MISCELLANEOUS) IMPLANT
SET ATX-X65L (MISCELLANEOUS) IMPLANT
SHEATH PINNACLE 5F 10CM (SHEATH) IMPLANT
SHEATH PROBE COVER 6X72 (BAG) IMPLANT
TRAY PV CATH (CUSTOM PROCEDURE TRAY) ×2 IMPLANT
WIRE HITORQ VERSACORE ST 145CM (WIRE) IMPLANT

## 2023-07-09 NOTE — Interval H&P Note (Signed)
 History and Physical Interval Note:  07/09/2023 7:57 AM  Bradley Ferguson  has presented today for surgery, with the diagnosis of pad.  The various methods of treatment have been discussed with the patient and family. After consideration of risks, benefits and other options for treatment, the patient has consented to  Procedure(s): ABDOMINAL AORTOGRAM W/LOWER EXTREMITY (Left) as a surgical intervention.  The patient's history has been reviewed, patient examined, no change in status, stable for surgery.  I have reviewed the patient's chart and labs.  Questions were answered to the patient's satisfaction.     Dorn Lesches

## 2023-07-09 NOTE — Progress Notes (Signed)
 Site area: right groin  Site Prior to Removal:  Level 0 Pressure Applied For: 25 mins Manual:   yes Patient Status During Pull:  Tolerated well.  Post Pull Site:  Level 0 Post Pull Instructions Given:  yes Post Pull Pulses Present: doppler  Dressing Applied:  yes Bedrest begins @ 0930 Comments:  43fr sheath

## 2023-07-09 NOTE — Discharge Instructions (Addendum)
DRINK PLENTY OF FLUIDS.

## 2023-07-10 ENCOUNTER — Encounter (HOSPITAL_COMMUNITY): Payer: Self-pay | Admitting: Cardiovascular Disease

## 2023-07-13 ENCOUNTER — Encounter: Payer: Self-pay | Admitting: Cardiovascular Disease

## 2023-07-13 ENCOUNTER — Ambulatory Visit: Payer: PPO | Attending: Cardiovascular Disease | Admitting: Cardiovascular Disease

## 2023-07-13 VITALS — BP 116/70 | HR 80 | Ht 73.0 in | Wt 180.4 lb

## 2023-07-13 DIAGNOSIS — I10 Essential (primary) hypertension: Secondary | ICD-10-CM

## 2023-07-13 DIAGNOSIS — Z01818 Encounter for other preprocedural examination: Secondary | ICD-10-CM

## 2023-07-13 DIAGNOSIS — F17200 Nicotine dependence, unspecified, uncomplicated: Secondary | ICD-10-CM | POA: Diagnosis not present

## 2023-07-13 DIAGNOSIS — R931 Abnormal findings on diagnostic imaging of heart and coronary circulation: Secondary | ICD-10-CM | POA: Diagnosis not present

## 2023-07-13 DIAGNOSIS — I739 Peripheral vascular disease, unspecified: Secondary | ICD-10-CM

## 2023-07-13 DIAGNOSIS — E782 Mixed hyperlipidemia: Secondary | ICD-10-CM | POA: Diagnosis not present

## 2023-07-13 DIAGNOSIS — I480 Paroxysmal atrial fibrillation: Secondary | ICD-10-CM

## 2023-07-13 NOTE — Assessment & Plan Note (Signed)
 Long history of tobacco use currently on Wellbutrin and he has almost stopped.

## 2023-07-13 NOTE — Patient Instructions (Signed)
 Medication Instructions:  Your physician recommends that you continue on your current medications as directed. Please refer to the Current Medication list given to you today.  *If you need a refill on your cardiac medications before your next appointment, please call your pharmacy*   Lab Work: Your physician recommends that you return for lab work in: 3 months for FASTING lipid/liver panel  If you have labs (blood work) drawn today and your tests are completely normal, you will receive your results only by: MyChart Message (if you have MyChart) OR A paper copy in the mail If you have any lab test that is abnormal or we need to change your treatment, we will call you to review the results.   Testing/Procedures: Your physician has requested that you have a lexiscan  myoview . For further information please visit [email or web address] Please follow instruction sheet, as given. This will take place at [address], [address]  How to prepare for your Myocardial Perfusion Test: Do not eat or drink 3 hours prior to your test, except you may have water . Do not consume products containing caffeine (regular or decaffeinated) 12 hours prior to your test. (ex: coffee, chocolate, sodas, tea). Do bring a list of your current medications with you.  If not listed below, you may take your medications as normal. Do wear comfortable clothes (no dresses or overalls) and walking shoes, tennis shoes preferred (No heels or open toe shoes are allowed). Do NOT wear cologne, perfume, aftershave, or lotions (deodorant is allowed). The test will take approximately 3 to 4 hours to complete If these instructions are not followed, your test will have to be rescheduled.    Follow-Up: At [facility], you and your health needs are our priority.  As part of our continuing mission to provide you with exceptional heart care, we have created designated Provider Care Teams.  These Care Teams include your primary  Cardiologist (physician) and Advanced Practice Providers (APPs -  Physician Assistants and Nurse Practitioners) who all work together to provide you with the care you need, when you need it.  We recommend signing up for the patient portal called MyChart.  Sign up information is provided on this After Visit Summary.  MyChart is used to connect with patients for Virtual Visits (Telemedicine).  Patients are able to view lab/test results, encounter notes, upcoming appointments, etc.  Non-urgent messages can be sent to your provider as well.   To learn more about what you can do with MyChart, go to [email or web address].    Your next appointment:   3 month(s)  Provider:   Dorn Lesches, MD     Other Instructions

## 2023-07-13 NOTE — Assessment & Plan Note (Signed)
 Coronary calcium score performed 07/07/2023 was 1483 the majority of which was in the circumflex as well as LAD and RCA.  He is completely asymptomatic.  Will get a Lexiscan Myoview to her stratify him.

## 2023-07-13 NOTE — Assessment & Plan Note (Signed)
 History of essential hypertension her blood pressure measured today at 116/70.  He is not on antihypertensive medications.

## 2023-07-13 NOTE — Assessment & Plan Note (Signed)
 History of PAD status post orbital atherectomy, PTA and covered stenting of a high-grade calcified subtotally occluded right common iliac artery 04/27/2017.  Because of progressive left greater than right lower extremity claudication I performed outpatient peripheral angiography revealing a patent stent, 95% calcified bilateral common femoral artery stenoses, 90% calcified left common iliac artery stenosis and occluded SFAs bilaterally.  I did review the case with Dr. Gretta who has agreed to perform common femoral endarterectomies and endovascular treatment of the left common iliac artery.

## 2023-07-13 NOTE — Progress Notes (Signed)
 07/13/2023 Bradley Ferguson   10/21/1950  [id number]  Primary Physician Tonita Fallow, MD Primary Cardiologist: Dorn JINNY Lesches MD GENI CODY MADEIRA, FSCAI  HPI:  Bradley Ferguson is a 73 y.o.   separated, father of 3, grandfather one grandchild who works as a multimedia programmer. He was referred by Dr. Tonita for peripheral vascular evaluation because of life limiting claudication. I last saw him in the office 06/16/2023.SABRA His risk factors include treated hyperlipidemia and 45 pack years of tobacco abuse continued to smoke one pack per day. Never had a heart attack or stroke. He does complain of some dyspnea on exertion probably related to COPD. He's had A. fib ablation back in 2011 by Dr. Fernande but has not followed up since. He has complained of less limiting claudication over the last year which is symmetric and bilateral. He can walk one block after which she has to stop because of discomfort. He has had lower extremity arterial Doppler studies in our office 04/11/17 revealing a high-frequency signal in his right common iliac artery and a total left SFA.   I performed peripheral angiography on him 04/27/17 revealing a 99% calcified eccentric proximal right common iliac artery stenosis and bilateral long segment SFA chronic total occlusions which were highly calcified. I performed diamondback orbital rotational atherectomy, PTCA and cover stenting using a 7 mm x 39 mm long VBX covered stent with an excellent angiographic result. The claudication had somewhat improved as has his Doppler studies. I do not think his SFAs are easily percutaneously addressable.   He continues to complain of some continued claudication.  He continues to smoke as well.  He also complains of some shortness of breath.    He had Dopplers performed 12/21/2019 revealing ABIs of 0.7 range bilaterally with a patent right iliac stent.  I believe he would benefit from the addition of Pletal .  We will explore this with our Pharm.D. to  determine if there are any drug drug interactions.  He is also not at goal on high-dose atorvastatin  and may benefit from being on a PCSK9 inhibitor as well.     He had a abdominal pelvic CTA that showed a patent iliac stent, confirmed SFA occlusion with potential progression of disease in his common femoral arteries bilaterally.  He has had progressive claudication with a decline in his left ABI from 0.54-0.39.  The Dopplers also suggest occlusion of the left iliac artery and on exam he does not have a left femoral pulse.  Does have a faint right femoral pulse without bruit.  Given his symptoms I favor proceeding with peripheral angiography to assess his anatomy and make further assessment as to his revascularization options.  Since I saw him several weeks ago I did do peripheral angiography on him 07/09/2023 revealing a patent right common iliac artery stent, 90% calcified left common iliac artery stenosis, high-grade calcified bilateral common femoral stenoses with bilateral SFA occlusion.  I did review his angiogram with Dr. Gretta who felt that he was a candidate for bilateral common femoral endarterectomies with patch angioplasty with simultaneous endovascular treatment of his left common iliac artery stenosis.  Given his elevated coronary calcium  score of 1483 we will also obtain a Lexiscan  Myoview  to her stratify him.  Of note, he is on Wellbutrin  and is trying to stop smoking.   Current Meds  Medication Sig   albuterol  (VENTOLIN  HFA) 108 (90 Base) MCG/ACT inhaler Use  2 inhalations  15 minutes apart  every 4  hours  to rescue Asthma Attack   aspirin  EC 81 MG tablet Take 81 mg by mouth daily. Swallow whole.   atorvastatin  (LIPITOR ) 80 MG tablet Take 1 tablet Daily for Cholesterol   buPROPion  (WELLBUTRIN  SR) 150 MG 12 hr tablet Take 1 tablet by mouth daily for 3 days then increase to 1 tablet by mouth twice a day.   Cholecalciferol  250 MCG (10000 UT) CAPS Take 10,000 Units by mouth daily.     dofetilide  (TIKOSYN ) 500 MCG capsule Take 1 capsule (500 mcg total) by mouth 2 (two) times daily.   gabapentin  (NEURONTIN ) 300 MG capsule Take 1 capsule 3 x /day for Neuropathy Pain   ketoconazole  (NIZORAL ) 2 % cream Apply 1 Application topically daily as needed (Fungus).   pyridOXINE  (VITAMIN B-6) 100 MG tablet Take 100 mg by mouth daily.   rivaroxaban  (XARELTO ) 20 MG TABS tablet Take 1 tablet Daily to Prevent Blood Clots   triamcinolone  cream (KENALOG ) 0.5 % Apply 1 Application topically daily as needed (skin irritation).     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  Tobacco Use   Smoking status: Every Day    Current packs/day: 0.50    Average packs/day: 0.5 packs/day for 54.0 years (27.0 ttl pk-yrs)    Types: Cigarettes    Start date: 37   Smokeless tobacco: Never  Vaping Use   Vaping status: Never Used  Substance and Sexual Activity   Alcohol use: Yes    Alcohol/week: 2.0 standard drinks of alcohol    Types: 2 Standard drinks or equivalent per week    Comment: 2 drinks per week    Drug use: Never   Sexual activity: Not on file  Other Topics Concern   Not on file  Social History Narrative   Not on file   Social Drivers of Health   Financial Resource Strain: Not on file  Food Insecurity: Food Insecurity Present (05/20/2021)   Hunger Vital Sign    Worried About Running Out of Food in the Last Year: Sometimes true    Ran Out of Food in the Last Year: Never true  Transportation Needs: Not on file  Physical Activity: Not on file  Stress: Not on file  Social Connections: Not on  file  Intimate Partner Violence: Not on file     Review of Systems: General: negative for chills, fever, night sweats or weight changes.  Cardiovascular: negative for chest pain, dyspnea on exertion, edema, orthopnea, palpitations, paroxysmal nocturnal dyspnea or shortness of breath Dermatological: negative for rash Respiratory: negative for cough or wheezing Urologic: negative for hematuria Abdominal: negative for nausea, vomiting, diarrhea, bright red blood per rectum, melena, or hematemesis Neurologic: negative for visual changes, syncope, or dizziness All other systems reviewed and are otherwise negative except as noted above.    Blood pressure 116/70, pulse 80, height 6' 1 (1.854 m), weight 180 lb 6.4 oz (81.8 kg), SpO2 98%.  General appearance: alert and no distress Neck: no adenopathy, no carotid bruit, no JVD,  supple, symmetrical, trachea midline, and thyroid  not enlarged, symmetric, no tenderness/mass/nodules Lungs: clear to auscultation bilaterally Heart: regular rate and rhythm, S1, S2 normal, no murmur, click, rub or gallop Extremities: extremities normal, atraumatic, no cyanosis or edema Pulses: Absent pedal pulses Skin: Skin color, texture, turgor normal. No rashes or lesions Neurologic: Grossly normal  EKG not performed today      ASSESSMENT AND PLAN:   TOBACCO ABUSE Long history of tobacco use currently on Wellbutrin  and he has almost stopped.  Atrial fibrillation (HCC) History of PAF status post ablation by Dr. Fernande 2011.  He is currently on Tikosyn  and Xarelto .  His EKG performed in August showed sinus rhythm with right bundle branch block.  Hyperlipidemia, mixed History of hyperlipidemia on high-dose atorvastatin  which she takes sporadically.  Lipid profile performed 1//25 showed total cholesterol 160, LDL of 90 and HDL 45.  Given his severely elevated coronary calcium  score and PAD.  LDL goal should be in the 39-29 range.  He has been counseled to be more  compliant with his statin drug.  Claudication in peripheral vascular disease (HCC) History of PAD status post orbital atherectomy, PTA and covered stenting of a high-grade calcified subtotally occluded right common iliac artery 04/27/2017.  Because of progressive left greater than right lower extremity claudication I performed outpatient peripheral angiography revealing a patent stent, 95% calcified bilateral common femoral artery stenoses, 90% calcified left common iliac artery stenosis and occluded SFAs bilaterally.  I did review the case with Dr. Gretta who has agreed to perform common femoral endarterectomies and endovascular treatment of the left common iliac artery.  Essential hypertension History of essential hypertension her blood pressure measured today at 116/70.  He is not on antihypertensive medications.  Elevated coronary artery calcium  score Coronary calcium  score performed 07/07/2023 was 1483 the majority of which was in the circumflex as well as LAD and RCA.  He is completely asymptomatic.  Will get a Lexiscan  Myoview  to her stratify him.     Dorn DOROTHA Lesches MD FACP,FACC,FAHA, [facility] 07/13/2023 11:00 AM

## 2023-07-13 NOTE — Assessment & Plan Note (Signed)
 History of hyperlipidemia on high-dose atorvastatin  which she takes sporadically.  Lipid profile performed 1//25 showed total cholesterol 160, LDL of 90 and HDL 45.  Given his severely elevated coronary calcium  score and PAD.  LDL goal should be in the 39-29 range.  He has been counseled to be more compliant with his statin drug.

## 2023-07-13 NOTE — Assessment & Plan Note (Signed)
 History of PAF status post ablation by Dr. Graciela Husbands 2011.  He is currently on Tikosyn and Xarelto.  His EKG performed in August showed sinus rhythm with right bundle branch block.

## 2023-07-14 ENCOUNTER — Encounter (HOSPITAL_COMMUNITY): Payer: Self-pay

## 2023-07-15 ENCOUNTER — Other Ambulatory Visit: Payer: Self-pay | Admitting: Cardiovascular Disease

## 2023-07-16 ENCOUNTER — Telehealth: Payer: Self-pay | Admitting: Cardiovascular Disease

## 2023-07-16 NOTE — Telephone Encounter (Signed)
Bradley Ferguson [facility] radiology calling to report addendum ct calcium 1/7  Please review

## 2023-07-20 ENCOUNTER — Ambulatory Visit (HOSPITAL_COMMUNITY)
Admission: RE | Admit: 2023-07-20 | Payer: PPO | Source: Ambulatory Visit | Attending: Cardiovascular Disease | Admitting: Cardiovascular Disease

## 2023-07-20 ENCOUNTER — Ambulatory Visit (HOSPITAL_COMMUNITY): Payer: PPO

## 2023-07-22 ENCOUNTER — Encounter (HOSPITAL_COMMUNITY): Payer: PPO

## 2023-07-24 ENCOUNTER — Ambulatory Visit (HOSPITAL_COMMUNITY): Payer: PPO | Attending: Cardiovascular Disease

## 2023-07-24 DIAGNOSIS — R931 Abnormal findings on diagnostic imaging of heart and coronary circulation: Secondary | ICD-10-CM | POA: Insufficient documentation

## 2023-07-24 DIAGNOSIS — Z01810 Encounter for preprocedural cardiovascular examination: Secondary | ICD-10-CM | POA: Insufficient documentation

## 2023-07-24 DIAGNOSIS — Z01818 Encounter for other preprocedural examination: Secondary | ICD-10-CM | POA: Insufficient documentation

## 2023-07-24 LAB — MYOCARDIAL PERFUSION IMAGING
LV dias vol: 53 mL (ref 62–150)
LV sys vol: 16 mL
Nuc Stress EF: 70 %
Peak HR: 85 {beats}/min
Rest HR: 75 {beats}/min
Rest Nuclear Isotope Dose: 10.7 mCi
SDS: 0
SRS: 0
SSS: 0
ST Depression (mm): 0 mm
Stress Nuclear Isotope Dose: 30.2 mCi
TID: 1.04

## 2023-07-24 MED ORDER — TECHNETIUM TC 99M TETROFOSMIN IV KIT
30.2000 | PACK | Freq: Once | INTRAVENOUS | Status: AC | PRN
  Administered 2023-07-24: 30.2 via INTRAVENOUS

## 2023-07-24 MED ORDER — TECHNETIUM TC 99M TETROFOSMIN IV KIT
10.7000 | PACK | Freq: Once | INTRAVENOUS | Status: AC | PRN
  Administered 2023-07-24: 10.7 via INTRAVENOUS

## 2023-07-24 MED ORDER — REGADENOSON 0.4 MG/5ML IV SOLN
0.4000 mg | Freq: Once | INTRAVENOUS | Status: AC
  Administered 2023-07-24: 0.4 mg via INTRAVENOUS
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
## 2023-07-28 ENCOUNTER — Encounter: Payer: Self-pay | Admitting: Vascular Surgery

## 2023-07-28 ENCOUNTER — Telehealth: Payer: Self-pay

## 2023-07-28 ENCOUNTER — Ambulatory Visit: Payer: PPO | Admitting: Vascular Surgery

## 2023-07-28 VITALS — BP 143/83 | HR 82 | Temp 98.1°F | Resp 20 | Ht 73.0 in | Wt 172.2 lb

## 2023-07-28 DIAGNOSIS — I70222 Atherosclerosis of native arteries of extremities with rest pain, left leg: Secondary | ICD-10-CM | POA: Insufficient documentation

## 2023-07-28 NOTE — Telephone Encounter (Signed)
Called pt after he left office to provide him with the hospital billing and accounting numbers along with CPT codes for his surgery, as he requested. He does not wish to schedule at this time and prefers to call us once he is ready.

## 2023-07-29 ENCOUNTER — Ambulatory Visit: Payer: PPO | Admitting: Cardiovascular Disease

## 2023-07-30 ENCOUNTER — Other Ambulatory Visit: Payer: Self-pay

## 2023-07-30 DIAGNOSIS — I480 Paroxysmal atrial fibrillation: Secondary | ICD-10-CM

## 2023-07-30 MED ORDER — RIVAROXABAN 20 MG PO TABS: ORAL_TABLET | ORAL | 3 refills | Status: DC

## 2023-08-06 ENCOUNTER — Other Ambulatory Visit: Payer: Self-pay | Admitting: *Deleted

## 2023-08-06 DIAGNOSIS — I70222 Atherosclerosis of native arteries of extremities with rest pain, left leg: Secondary | ICD-10-CM

## 2023-08-13 ENCOUNTER — Other Ambulatory Visit: Payer: Self-pay

## 2023-08-13 DIAGNOSIS — E782 Mixed hyperlipidemia: Secondary | ICD-10-CM

## 2023-08-13 MED ORDER — ATORVASTATIN CALCIUM 80 MG PO TABS: ORAL_TABLET | ORAL | 0 refills | Status: DC

## 2023-08-14 ENCOUNTER — Other Ambulatory Visit: Payer: Self-pay | Admitting: Family

## 2023-08-14 MED ORDER — GABAPENTIN 300 MG PO CAPS: ORAL_CAPSULE | ORAL | 3 refills | Status: DC

## 2023-08-15 ENCOUNTER — Other Ambulatory Visit: Payer: Self-pay | Admitting: Nurse Practitioner

## 2023-08-17 NOTE — Pre-Procedure Instructions (Signed)
Surgical Instructions   Your procedure is scheduled on August 24, 2023. Report to [facility] Main Entrance "A" at 7:15 A.M., then check in with the Admitting office. Any questions or running late day of surgery: call [phone]  Questions prior to your surgery date: call [phone], Monday-Friday, 8am-4pm. If you experience any cold or flu symptoms such as cough, fever, chills, shortness of breath, etc. between now and your scheduled surgery, please notify us at the above number.     Remember:  Do not eat or drink after midnight the night before your surgery    Take these medicines the morning of surgery with A SIP OF WATER: aspirin  atorvastatin (LIPITOR)  dofetilide (TIKOSYN)  gabapentin (NEURONTIN)    May take these medicines IF NEEDED: albuterol (VENTOLIN HFA) inhaler - please bring inhaler with you morning of surgery   One week prior to surgery, STOP taking any Aleve, Naproxen, Ibuprofen, Motrin, Advil, Goody's, BC's, all herbal medications, fish oil, and non-prescription vitamins.                     Do NOT Smoke (Tobacco/Vaping) for 24 hours prior to your procedure.  If you use a CPAP at night, you may bring your mask/headgear for your overnight stay.   You will be asked to remove any contacts, glasses, piercing's, hearing aid's, dentures/partials prior to surgery. Please bring cases for these items if needed.    Patients discharged the day of surgery will not be allowed to drive home, and someone needs to stay with them for 24 hours.  SURGICAL WAITING ROOM VISITATION Patients may have no more than 2 support people in the waiting area - these visitors may rotate.   Pre-op nurse will coordinate an appropriate time for 1 ADULT support person, who may not rotate, to accompany patient in pre-op.  Children under the age of 3 must have an adult with them who is not the patient and must remain in the main waiting area with an adult.  If the patient needs to stay at  the hospital during part of their recovery, the visitor guidelines for inpatient rooms apply.  Please refer to the [facility] website for the visitor guidelines for any additional information.   If you received a COVID test during your pre-op visit  it is requested that you wear a mask when out in public, stay away from anyone that may not be feeling well and notify your surgeon if you develop symptoms. If you have been in contact with anyone that has tested positive in the last 10 days please notify you surgeon.      Pre-operative CHG Bathing Instructions   You can play a key role in reducing the risk of infection after surgery. Your skin needs to be as free of germs as possible. You can reduce the number of germs on your skin by washing with CHG (chlorhexidine gluconate) soap before surgery. CHG is an antiseptic soap that kills germs and continues to kill germs even after washing.   DO NOT use if you have an allergy to chlorhexidine/CHG or antibacterial soaps. If your skin becomes reddened or irritated, stop using the CHG and notify one of our RNs at [phone].              TAKE A SHOWER THE NIGHT BEFORE SURGERY AND THE DAY OF SURGERY    Please keep in mind the following:  DO NOT shave, including legs and underarms, 48 hours prior to surgery.  You may shave your face before/day of surgery.  Place clean sheets on your bed the night before surgery Use a clean washcloth (not used since being washed) for each shower. DO NOT sleep with pet's night before surgery.  CHG Shower Instructions:  Wash your face and private area with normal soap. If you choose to wash your hair, wash first with your normal shampoo.  After you use shampoo/soap, rinse your hair and body thoroughly to remove shampoo/soap residue.  Turn the water OFF and apply half the bottle of CHG soap to a CLEAN washcloth.  Apply CHG soap ONLY FROM YOUR NECK DOWN TO YOUR TOES (washing for 3-5 minutes)  DO NOT use CHG soap on  face, private areas, open wounds, or sores.  Pay special attention to the area where your surgery is being performed.  If you are having back surgery, having someone wash your back for you may be helpful. Wait 2 minutes after CHG soap is applied, then you may rinse off the CHG soap.  Pat dry with a clean towel  Put on clean pajamas    Additional instructions for the day of surgery: DO NOT APPLY any lotions, deodorants, cologne, or perfumes.   Do not wear jewelry or makeup Do not wear nail polish, gel polish, artificial nails, or any other type of covering on natural nails (fingers and toes) Do not bring valuables to the hospital. [facility] is not responsible for valuables/personal belongings. Put on clean/comfortable clothes.  Please brush your teeth.  Ask your nurse before applying any prescription medications to the skin.

## 2023-08-18 ENCOUNTER — Encounter (HOSPITAL_COMMUNITY)
Admission: RE | Admit: 2023-08-18 | Discharge: 2023-08-18 | Disposition: A | Discharge status: Home / Self Care | Payer: PPO | Source: Ambulatory Visit | Attending: Vascular Surgery

## 2023-08-18 ENCOUNTER — Other Ambulatory Visit: Payer: Self-pay

## 2023-08-18 ENCOUNTER — Encounter (HOSPITAL_COMMUNITY): Payer: Self-pay

## 2023-08-18 VITALS — BP 113/98 | HR 78 | Temp 98.2°F | Resp 19 | Ht 73.0 in | Wt 176.4 lb

## 2023-08-18 DIAGNOSIS — Z01818 Encounter for other preprocedural examination: Secondary | ICD-10-CM

## 2023-08-18 DIAGNOSIS — Z01812 Encounter for preprocedural laboratory examination: Secondary | ICD-10-CM | POA: Diagnosis present

## 2023-08-18 DIAGNOSIS — I70222 Atherosclerosis of native arteries of extremities with rest pain, left leg: Secondary | ICD-10-CM | POA: Diagnosis not present

## 2023-08-18 HISTORY — DX: Essential (primary) hypertension: I10

## 2023-08-18 LAB — PROTIME-INR
INR: 1.6 — ABNORMAL HIGH (ref 0.8–1.2)
Prothrombin Time: 18.8 s — ABNORMAL HIGH (ref 11.4–15.2)

## 2023-08-18 LAB — COMPREHENSIVE METABOLIC PANEL
ALT: 19 U/L (ref 0–44)
AST: 27 U/L (ref 15–41)
Albumin: 4.2 g/dL (ref 3.5–5.0)
Alkaline Phosphatase: 67 U/L (ref 38–126)
Anion gap: 11 (ref 5–15)
BUN: 14 mg/dL (ref 8–23)
CO2: 27 mmol/L (ref 22–32)
Calcium: 9.8 mg/dL (ref 8.9–10.3)
Chloride: 103 mmol/L (ref 98–111)
Creatinine, Ser: 1.37 mg/dL — ABNORMAL HIGH (ref 0.61–1.24)
GFR, Estimated: 55 mL/min — ABNORMAL LOW (ref >60)
Glucose, Bld: 98 mg/dL (ref 70–99)
Potassium: 4.6 mmol/L (ref 3.5–5.1)
Sodium: 141 mmol/L (ref 135–145)
Total Bilirubin: 0.7 mg/dL (ref 0.0–1.2)
Total Protein: 7.6 g/dL (ref 6.5–8.1)

## 2023-08-18 LAB — URINALYSIS, ROUTINE W REFLEX MICROSCOPIC
Bilirubin Urine: NEGATIVE
Glucose, UA: NEGATIVE mg/dL
Hgb urine dipstick: NEGATIVE
Ketones, ur: NEGATIVE mg/dL
Leukocytes,Ua: NEGATIVE
Nitrite: NEGATIVE
Protein, ur: NEGATIVE mg/dL
Specific Gravity, Urine: 1.01 (ref 1.005–1.030)
pH: 5 (ref 5.0–8.0)

## 2023-08-18 LAB — CBC
HCT: 44.8 % (ref 39.0–52.0)
Hemoglobin: 14.7 g/dL (ref 13.0–17.0)
MCH: 30.4 pg (ref 26.0–34.0)
MCHC: 32.8 g/dL (ref 30.0–36.0)
MCV: 92.8 fL (ref 80.0–100.0)
Platelets: 173 10*3/uL (ref 150–400)
RBC: 4.83 MIL/uL (ref 4.22–5.81)
RDW: 14 % (ref 11.5–15.5)
WBC: 7.7 10*3/uL (ref 4.0–10.5)
nRBC: 0 % (ref 0.0–0.2)

## 2023-08-18 LAB — TYPE AND SCREEN
ABO/RH(D): O POS
Antibody Screen: NEGATIVE

## 2023-08-18 LAB — SURGICAL PCR SCREEN

## 2023-08-18 LAB — APTT: aPTT: 43 s — ABNORMAL HIGH (ref 24–36)

## 2023-08-18 NOTE — Progress Notes (Signed)
Patient was notified the surgery date was changed from 08-24-23 to 09-02-23 today.  RN contacted Cherene Julian and Caprice Beaver and verified it was okay to continue to go forward with the PAT appt today.  All labs had been drawn.  ABO was ordered today and patient will need to have a type and screen done the day of surgery.    Per Marchelle Folks, directions were the same as prior and aspirin is no hold and Xarelto will be held for 3 day's prior to the surgery.

## 2023-08-18 NOTE — Progress Notes (Signed)
PCP - Dr. [name] Cardiologist - Dr. Nanetta Batty LOV 07-13-23  PPM/ICD - Denies Device Orders - n/a Rep Notified - n/a  Chest x-ray - Denies EKG - 02-09-23 Stress Test - 07-24-23 - coronary calcium score was 1483 leading to the stress test. ECHO - 03-08-20 Cardiac Cath - Denies  Sleep Study - Denies CPAP - n/a  NON-diabetic  Last dose of GLP1 agonist-  Denies GLP1 instructions: n/a  Blood Thinner Instructions: Xarelto - last dose 3 days prior on 08-29-23 Aspirin Instructions: instructed to continue taking per Caprice Beaver (secure chat w/Levenia Skalicky on 08-18-23).  ERAS Protcol - NPO PRE-SURGERY Ensure or G2- none  COVID TEST- No   Anesthesia review: Yes, cardiac clearance, A-fib, TB latent, HTN, Raynauds disease.  Patient states he has no family and noone to help him once discharged home.  He rents a room in a house.  Patient denies shortness of breath, fever, cough and chest pain at PAT appointment. Patient denies any respiratory issues at this time.    All instructions explained to the patient, with a verbal understanding of the material. Patient agrees to go over the instructions while at home for a better understanding. Patient also instructed to self quarantine after being tested for COVID-19. The opportunity to ask questions was provided.

## 2023-08-18 NOTE — Progress Notes (Addendum)
Surgical Instructions     Your procedure is scheduled on Wednesday, September 02, 2023. Report to [facility] Main Entrance "A" at 6:30 A.M., then check in with the Admitting office. Any questions or running late day of surgery: call [phone]   Questions prior to your surgery date: call [phone], Monday-Friday, 8am-4pm. If you experience any cold or flu symptoms such as cough, fever, chills, shortness of breath, etc. between now and your scheduled surgery, please notify us at the above number.            Remember:       Do not eat or drink after midnight the night before your surgery          Take these medicines the morning of surgery with A SIP OF WATER: aspirin  atorvastatin (LIPITOR)  dofetilide (TIKOSYN)  gabapentin (NEURONTIN)      May take these medicines IF NEEDED: albuterol (VENTOLIN HFA) inhaler - please bring inhaler with you morning of surgery  Hold your rivaroxaban (XARELTO) for 3 day's prior to surgery.  Last dose will be on 08-29-23.       One week prior to surgery, STOP taking any Aleve, Naproxen, Ibuprofen, Motrin, Advil, Goody's, BC's, all herbal medications, fish oil, and non-prescription vitamins.                     Do NOT Smoke (Tobacco/Vaping) for 24 hours prior to your procedure.   If you use a CPAP at night, you may bring your mask/headgear for your overnight stay.   You will be asked to remove any contacts, glasses, piercing's, hearing aid's, dentures/partials prior to surgery. Please bring cases for these items if needed.    Patients discharged the day of surgery will not be allowed to drive home, and someone needs to stay with them for 24 hours.   SURGICAL WAITING ROOM VISITATION Patients may have no more than 2 support people in the waiting area - these visitors may rotate.   Pre-op nurse will coordinate an appropriate time for 1 ADULT support person, who may not rotate, to accompany patient in pre-op.  Children under the age of 50 must have an  adult with them who is not the patient and must remain in the main waiting area with an adult.   If the patient needs to stay at the hospital during part of their recovery, the visitor guidelines for inpatient rooms apply.   Please refer to the [facility] website for the visitor guidelines for any additional information.     If you received a COVID test during your pre-op visit  it is requested that you wear a mask when out in public, stay away from anyone that may not be feeling well and notify your surgeon if you develop symptoms. If you have been in contact with anyone that has tested positive in the last 10 days please notify you surgeon.         Pre-operative CHG Bathing Instructions    You can play a key role in reducing the risk of infection after surgery. Your skin needs to be as free of germs as possible. You can reduce the number of germs on your skin by washing with CHG (chlorhexidine gluconate) soap before surgery. CHG is an antiseptic soap that kills germs and continues to kill germs even after washing.    DO NOT use if you have an allergy to chlorhexidine/CHG or antibacterial soaps. If your skin becomes reddened or irritated, stop using  the CHG and notify one of our RNs at [phone].               TAKE A SHOWER THE NIGHT BEFORE SURGERY AND THE DAY OF SURGERY     Please keep in mind the following:  DO NOT shave, including legs and underarms, 48 hours prior to surgery.   You may shave your face before/day of surgery.  Place clean sheets on your bed the night before surgery Use a clean washcloth (not used since being washed) for each shower. DO NOT sleep with pet's night before surgery.   CHG Shower Instructions:  Wash your face and private area with normal soap. If you choose to wash your hair, wash first with your normal shampoo.  After you use shampoo/soap, rinse your hair and body thoroughly to remove shampoo/soap residue.  Turn the water OFF and apply half the bottle  of CHG soap to a CLEAN washcloth.  Apply CHG soap ONLY FROM YOUR NECK DOWN TO YOUR TOES (washing for 3-5 minutes)  DO NOT use CHG soap on face, private areas, open wounds, or sores.  Pay special attention to the area where your surgery is being performed.  If you are having back surgery, having someone wash your back for you may be helpful. Wait 2 minutes after CHG soap is applied, then you may rinse off the CHG soap.  Pat dry with a clean towel  Put on clean pajamas     Additional instructions for the day of surgery: DO NOT APPLY any lotions, deodorants, cologne, or perfumes.   Do not wear jewelry or makeup Do not wear nail polish, gel polish, artificial nails, or any other type of covering on natural nails (fingers and toes) Do not bring valuables to the hospital. [facility] is not responsible for valuables/personal belongings. Put on clean/comfortable clothes.  Please brush your teeth.  Ask your nurse before applying any prescription medications to the skin.

## 2023-08-19 NOTE — Progress Notes (Signed)
Surgical PCR result invalid from pre-op appointment. Will need to recollect DOS. Order placed.

## 2023-08-20 ENCOUNTER — Encounter (HOSPITAL_COMMUNITY): Payer: Self-pay | Admitting: Vascular Surgery

## 2023-08-20 ENCOUNTER — Ambulatory Visit: Payer: PPO | Admitting: Podiatry
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
## 2023-08-20 NOTE — Anesthesia Preprocedure Evaluation (Addendum)
 Anesthesia Evaluation  Patient identified by MRN, date of birth, ID band Patient awake    Reviewed: Allergy & Precautions, NPO status , Patient's Chart, lab work & pertinent test results  Airway Mallampati: I  TM Distance: >3 FB Neck ROM: Full    Dental  (+) Edentulous Upper, Edentulous Lower   Pulmonary COPD, Current Smoker and Patient abstained from smoking.   breath sounds clear to auscultation       Cardiovascular hypertension, + CAD and + Peripheral Vascular Disease  + dysrhythmias Atrial Fibrillation  Rhythm:Regular Rate:Normal     Neuro/Psych  PSYCHIATRIC DISORDERS Anxiety Depression Bipolar Disorder    Neuromuscular disease    GI/Hepatic negative GI ROS, Neg liver ROS,,,  Endo/Other  negative endocrine ROS    Renal/GU negative Renal ROS     Musculoskeletal negative musculoskeletal ROS (+)    Abdominal   Peds  Hematology negative hematology ROS (+)   Anesthesia Other Findings   Reproductive/Obstetrics                             Anesthesia Physical Anesthesia Plan  ASA: 3  Anesthesia Plan: General   Post-op Pain Management: Tylenol PO (pre-op)*   Induction: Intravenous  PONV Risk Score and Plan: 2 and Ondansetron and Treatment may vary due to age or medical condition  Airway Management Planned: Oral ETT  Additional Equipment: Arterial line  Intra-op Plan:   Post-operative Plan: Extubation in OR  Informed Consent: I have reviewed the patients History and Physical, chart, labs and discussed the procedure including the risks, benefits and alternatives for the proposed anesthesia with the patient or authorized representative who has indicated his/her understanding and acceptance.       Plan Discussed with: CRNA  Anesthesia Plan Comments: (PAT note written 08/20/2023 by Shonna Chock, PA-C.  )       Anesthesia Quick Evaluation

## 2023-08-26 ENCOUNTER — Ambulatory Visit: Payer: PPO | Admitting: Podiatry

## 2023-08-26 ENCOUNTER — Encounter: Payer: Self-pay | Admitting: Podiatry

## 2023-08-26 DIAGNOSIS — I739 Peripheral vascular disease, unspecified: Secondary | ICD-10-CM

## 2023-08-26 DIAGNOSIS — B351 Tinea unguium: Secondary | ICD-10-CM | POA: Diagnosis not present

## 2023-08-26 DIAGNOSIS — M79674 Pain in right toe(s): Secondary | ICD-10-CM | POA: Diagnosis not present

## 2023-08-26 DIAGNOSIS — L84 Corns and callosities: Secondary | ICD-10-CM

## 2023-08-26 DIAGNOSIS — M79675 Pain in left toe(s): Secondary | ICD-10-CM | POA: Diagnosis not present

## 2023-08-27 NOTE — Progress Notes (Signed)
 Subjective:   Patient ID: Bradley Ferguson, male   DOB: 73 y.o.   MRN: [id number]   HPI Patient presents with painful lesion of the right second digit distal and is noted to have thick yellow brittle nailbeds 1-5 both feet that he cannot cut with the patient having severe vascular disease and due to have a stent placed in the next week.   ROS      Objective:  Physical Exam  Vascular status severely compromised no pulses PT DP with patient being followed by vascular surgeon who is an attempt to stent with thickened yellow brittle nailbeds 1-5 both feet and lesion second digit painful when pressed mycotic nail     Assessment:  Flexion with pain 1-5 both feet lesions second right high risk with pain associated with it     Plan:  H&E reviewed both conditions and debrided lesion right neurogenic bleeding debrided nailbeds 1-5 both feet no echogenic bleeding discussed the absolute importance for him to follow through with his vascular and that amputation at 1 point the future area may be necessary

## 2023-08-28 ENCOUNTER — Ambulatory Visit: Payer: PPO | Admitting: Nurse Practitioner

## 2023-09-02 ENCOUNTER — Encounter (HOSPITAL_COMMUNITY)
Admission: RE | Disposition: A | Discharge status: Home / Self Care | Payer: Self-pay | Source: Home / Self Care | Attending: Vascular Surgery

## 2023-09-02 ENCOUNTER — Other Ambulatory Visit: Payer: Self-pay

## 2023-09-02 ENCOUNTER — Inpatient Hospital Stay (HOSPITAL_COMMUNITY)

## 2023-09-02 ENCOUNTER — Inpatient Hospital Stay (HOSPITAL_COMMUNITY): Payer: Self-pay | Admitting: Vascular Surgery

## 2023-09-02 ENCOUNTER — Encounter (HOSPITAL_COMMUNITY): Payer: Self-pay | Admitting: Vascular Surgery

## 2023-09-02 ENCOUNTER — Inpatient Hospital Stay (HOSPITAL_COMMUNITY)
Admission: RE | Admit: 2023-09-02 | Discharge: 2023-09-04 | DRG: 272 | Disposition: A | Discharge status: Home / Self Care | Payer: PPO | Attending: Vascular Surgery | Admitting: Vascular Surgery

## 2023-09-02 DIAGNOSIS — F17210 Nicotine dependence, cigarettes, uncomplicated: Secondary | ICD-10-CM

## 2023-09-02 DIAGNOSIS — I70212 Atherosclerosis of native arteries of extremities with intermittent claudication, left leg: Secondary | ICD-10-CM

## 2023-09-02 DIAGNOSIS — I4891 Unspecified atrial fibrillation: Secondary | ICD-10-CM | POA: Diagnosis present

## 2023-09-02 DIAGNOSIS — I10 Essential (primary) hypertension: Secondary | ICD-10-CM | POA: Diagnosis present

## 2023-09-02 DIAGNOSIS — Z79899 Other long term (current) drug therapy: Secondary | ICD-10-CM | POA: Diagnosis not present

## 2023-09-02 DIAGNOSIS — J439 Emphysema, unspecified: Secondary | ICD-10-CM | POA: Diagnosis present

## 2023-09-02 DIAGNOSIS — Z5941 Food insecurity: Secondary | ICD-10-CM

## 2023-09-02 DIAGNOSIS — G629 Polyneuropathy, unspecified: Secondary | ICD-10-CM | POA: Diagnosis not present

## 2023-09-02 DIAGNOSIS — Z809 Family history of malignant neoplasm, unspecified: Secondary | ICD-10-CM | POA: Diagnosis not present

## 2023-09-02 DIAGNOSIS — Z888 Allergy status to other drugs, medicaments and biological substances status: Secondary | ICD-10-CM

## 2023-09-02 DIAGNOSIS — I70222 Atherosclerosis of native arteries of extremities with rest pain, left leg: Secondary | ICD-10-CM | POA: Diagnosis not present

## 2023-09-02 DIAGNOSIS — J45909 Unspecified asthma, uncomplicated: Secondary | ICD-10-CM | POA: Diagnosis not present

## 2023-09-02 DIAGNOSIS — I2510 Atherosclerotic heart disease of native coronary artery without angina pectoris: Secondary | ICD-10-CM

## 2023-09-02 DIAGNOSIS — R2689 Other abnormalities of gait and mobility: Secondary | ICD-10-CM | POA: Diagnosis not present

## 2023-09-02 DIAGNOSIS — Z7901 Long term (current) use of anticoagulants: Secondary | ICD-10-CM

## 2023-09-02 DIAGNOSIS — E785 Hyperlipidemia, unspecified: Secondary | ICD-10-CM | POA: Diagnosis present

## 2023-09-02 DIAGNOSIS — Z7982 Long term (current) use of aspirin: Secondary | ICD-10-CM | POA: Diagnosis not present

## 2023-09-02 DIAGNOSIS — Z7902 Long term (current) use of antithrombotics/antiplatelets: Secondary | ICD-10-CM | POA: Diagnosis not present

## 2023-09-02 DIAGNOSIS — I7300 Raynaud's syndrome without gangrene: Secondary | ICD-10-CM | POA: Diagnosis present

## 2023-09-02 DIAGNOSIS — I480 Paroxysmal atrial fibrillation: Secondary | ICD-10-CM

## 2023-09-02 DIAGNOSIS — F418 Other specified anxiety disorders: Secondary | ICD-10-CM | POA: Diagnosis not present

## 2023-09-02 DIAGNOSIS — Z01818 Encounter for other preprocedural examination: Principal | ICD-10-CM

## 2023-09-02 DIAGNOSIS — Z801 Family history of malignant neoplasm of trachea, bronchus and lung: Secondary | ICD-10-CM | POA: Diagnosis not present

## 2023-09-02 DIAGNOSIS — I4510 Unspecified right bundle-branch block: Secondary | ICD-10-CM | POA: Diagnosis present

## 2023-09-02 DIAGNOSIS — M349 Systemic sclerosis, unspecified: Secondary | ICD-10-CM | POA: Diagnosis not present

## 2023-09-02 DIAGNOSIS — I7409 Other arterial embolism and thrombosis of abdominal aorta: Secondary | ICD-10-CM | POA: Diagnosis present

## 2023-09-02 HISTORY — PX: FEMORAL-POPLITEAL BYPASS GRAFT: SHX937

## 2023-09-02 HISTORY — PX: ENDARTERECTOMY FEMORAL: SHX5804

## 2023-09-02 LAB — TYPE AND SCREEN
ABO/RH(D): O POS
Antibody Screen: NEGATIVE

## 2023-09-02 LAB — CBC
HCT: 31.5 % — ABNORMAL LOW (ref 39.0–52.0)
Hemoglobin: 10.4 g/dL — ABNORMAL LOW (ref 13.0–17.0)
MCH: 30.8 pg (ref 26.0–34.0)
MCHC: 33 g/dL (ref 30.0–36.0)
MCV: 93.2 fL (ref 80.0–100.0)
Platelets: 122 10*3/uL — ABNORMAL LOW (ref 150–400)
RBC: 3.38 MIL/uL — ABNORMAL LOW (ref 4.22–5.81)
RDW: 14.4 % (ref 11.5–15.5)
WBC: 8.9 10*3/uL (ref 4.0–10.5)
nRBC: 0 % (ref 0.0–0.2)

## 2023-09-02 LAB — POCT ACTIVATED CLOTTING TIME
Activated Clotting Time: 285 s
Activated Clotting Time: 227 s
Activated Clotting Time: 250 s

## 2023-09-02 LAB — CREATININE, SERUM
Creatinine, Ser: 1 mg/dL (ref 0.61–1.24)
GFR, Estimated: >60 mL/min (ref >60)

## 2023-09-02 SURGERY — BYPASS GRAFT FEMORAL-POPLITEAL ARTERY
Anesthesia: General | Site: Groin | Laterality: Left

## 2023-09-02 MED ORDER — PROPOFOL 10 MG/ML IV BOLUS
INTRAVENOUS | Status: AC
  Filled 2023-09-02: qty 20

## 2023-09-02 MED ORDER — MORPHINE SULFATE (PF) 2 MG/ML IV SOLN: 2.0000 mg | INTRAVENOUS | Status: DC | PRN

## 2023-09-02 MED ORDER — ACETAMINOPHEN 160 MG/5ML PO SOLN: 325.0000 mg | Freq: Once | ORAL | Status: DC | PRN

## 2023-09-02 MED ORDER — HYDROMORPHONE HCL 1 MG/ML IJ SOLN
INTRAMUSCULAR | Status: AC
Start: 2023-09-02 — End: 2023-09-03
  Filled 2023-09-02: qty 1

## 2023-09-02 MED ORDER — ALBUTEROL SULFATE HFA 108 (90 BASE) MCG/ACT IN AERS
INHALATION_SPRAY | RESPIRATORY_TRACT | Status: DC | PRN
  Administered 2023-09-02: 3 via RESPIRATORY_TRACT

## 2023-09-02 MED ORDER — ATORVASTATIN CALCIUM 80 MG PO TABS
80.0000 mg | ORAL_TABLET | Freq: Every day | ORAL | Status: DC
  Administered 2023-09-03 – 2023-09-04 (×2): 80 mg via ORAL
  Filled 2023-09-02 (×2): qty 1

## 2023-09-02 MED ORDER — ACETAMINOPHEN 650 MG RE SUPP: 325.0000 mg | RECTAL | Status: DC | PRN

## 2023-09-02 MED ORDER — PHENYLEPHRINE 80 MCG/ML (10ML) SYRINGE FOR IV PUSH (FOR BLOOD PRESSURE SUPPORT)
PREFILLED_SYRINGE | INTRAVENOUS | Status: DC | PRN
  Administered 2023-09-02 (×4): 80 ug via INTRAVENOUS

## 2023-09-02 MED ORDER — ALBUMIN HUMAN 5 % IV SOLN: INTRAVENOUS | Status: DC | PRN

## 2023-09-02 MED ORDER — DOCUSATE SODIUM 100 MG PO CAPS
100.0000 mg | ORAL_CAPSULE | Freq: Every day | ORAL | Status: DC
  Administered 2023-09-03 – 2023-09-04 (×2): 100 mg via ORAL
  Filled 2023-09-02 (×2): qty 1

## 2023-09-02 MED ORDER — MEPERIDINE HCL 25 MG/ML IJ SOLN: 6.2500 mg | INTRAMUSCULAR | Status: DC | PRN

## 2023-09-02 MED ORDER — CEFAZOLIN SODIUM-DEXTROSE 2-4 GM/100ML-% IV SOLN
2.0000 g | INTRAVENOUS | Status: AC
  Administered 2023-09-02: 2 g via INTRAVENOUS
  Filled 2023-09-02: qty 100

## 2023-09-02 MED ORDER — LABETALOL HCL 5 MG/ML IV SOLN: 10.0000 mg | INTRAVENOUS | Status: DC | PRN

## 2023-09-02 MED ORDER — DOFETILIDE 500 MCG PO CAPS
500.0000 ug | ORAL_CAPSULE | Freq: Two times a day (BID) | ORAL | Status: DC
  Administered 2023-09-02 – 2023-09-04 (×4): 500 ug via ORAL
  Filled 2023-09-02 (×4): qty 1

## 2023-09-02 MED ORDER — ALBUTEROL SULFATE (2.5 MG/3ML) 0.083% IN NEBU: 2.5000 mg | INHALATION_SOLUTION | RESPIRATORY_TRACT | Status: DC | PRN

## 2023-09-02 MED ORDER — GUAIFENESIN-DM 100-10 MG/5ML PO SYRP: 15.0000 mL | ORAL_SOLUTION | ORAL | Status: DC | PRN

## 2023-09-02 MED ORDER — ACETAMINOPHEN 325 MG PO TABS: 325.0000 mg | ORAL_TABLET | Freq: Once | ORAL | Status: DC | PRN

## 2023-09-02 MED ORDER — DROPERIDOL 2.5 MG/ML IJ SOLN: 0.6250 mg | Freq: Once | INTRAMUSCULAR | Status: DC | PRN

## 2023-09-02 MED ORDER — HYDROMORPHONE HCL 1 MG/ML IJ SOLN
0.2500 mg | INTRAMUSCULAR | Status: DC | PRN
  Administered 2023-09-02 (×2): 0.5 mg via INTRAVENOUS

## 2023-09-02 MED ORDER — LIDOCAINE 2% (20 MG/ML) 5 ML SYRINGE
INTRAMUSCULAR | Status: DC | PRN
  Administered 2023-09-02: 2 mL via INTRAVENOUS

## 2023-09-02 MED ORDER — ONDANSETRON HCL 4 MG/2ML IJ SOLN: 4.0000 mg | Freq: Four times a day (QID) | INTRAMUSCULAR | Status: DC | PRN

## 2023-09-02 MED ORDER — ASPIRIN 81 MG PO TBEC
81.0000 mg | DELAYED_RELEASE_TABLET | Freq: Every day | ORAL | Status: DC
  Administered 2023-09-02 – 2023-09-04 (×3): 81 mg via ORAL
  Filled 2023-09-02 (×3): qty 1

## 2023-09-02 MED ORDER — POTASSIUM CHLORIDE CRYS ER 20 MEQ PO TBCR: 20.0000 meq | EXTENDED_RELEASE_TABLET | Freq: Every day | ORAL | Status: DC | PRN

## 2023-09-02 MED ORDER — HEMOSTATIC AGENTS (NO CHARGE) OPTIME
TOPICAL | Status: DC | PRN
  Administered 2023-09-02: 1 via TOPICAL

## 2023-09-02 MED ORDER — DOFETILIDE 500 MCG PO CAPS
500.0000 ug | ORAL_CAPSULE | Freq: Two times a day (BID) | ORAL | Status: DC
  Filled 2023-09-02: qty 1

## 2023-09-02 MED ORDER — PROTAMINE SULFATE 10 MG/ML IV SOLN
INTRAVENOUS | Status: DC | PRN
Start: 2023-09-02 — End: 2023-09-02
  Administered 2023-09-02 (×2): 20 mg via INTRAVENOUS
  Administered 2023-09-02: 10 mg via INTRAVENOUS

## 2023-09-02 MED ORDER — BUPROPION HCL ER (SR) 150 MG PO TB12
150.0000 mg | ORAL_TABLET | Freq: Two times a day (BID) | ORAL | Status: DC
  Filled 2023-09-02 (×4): qty 1

## 2023-09-02 MED ORDER — CEFAZOLIN SODIUM-DEXTROSE 2-4 GM/100ML-% IV SOLN
2.0000 g | Freq: Three times a day (TID) | INTRAVENOUS | Status: AC
  Administered 2023-09-02 – 2023-09-03 (×2): 2 g via INTRAVENOUS
  Filled 2023-09-02 (×2): qty 100

## 2023-09-02 MED ORDER — METOPROLOL TARTRATE 5 MG/5ML IV SOLN: 2.0000 mg | INTRAVENOUS | Status: DC | PRN

## 2023-09-02 MED ORDER — ORAL CARE MOUTH RINSE: 15.0000 mL | Freq: Once | OROMUCOSAL | Status: AC

## 2023-09-02 MED ORDER — ALBUTEROL SULFATE HFA 108 (90 BASE) MCG/ACT IN AERS: 1.0000 | INHALATION_SPRAY | RESPIRATORY_TRACT | Status: DC | PRN

## 2023-09-02 MED ORDER — PROPOFOL 10 MG/ML IV BOLUS
INTRAVENOUS | Status: DC | PRN
  Administered 2023-09-02: 140 mg via INTRAVENOUS

## 2023-09-02 MED ORDER — HEPARIN 6000 UNIT IRRIGATION SOLUTION
Status: AC
  Filled 2023-09-02: qty 500

## 2023-09-02 MED ORDER — ASPIRIN 81 MG PO CHEW
81.0000 mg | CHEWABLE_TABLET | Freq: Once | ORAL | Status: DC
  Filled 2023-09-02: qty 1

## 2023-09-02 MED ORDER — CHLORHEXIDINE GLUCONATE CLOTH 2 % EX PADS: 6.0000 | MEDICATED_PAD | Freq: Once | CUTANEOUS | Status: DC

## 2023-09-02 MED ORDER — FENTANYL CITRATE (PF) 250 MCG/5ML IJ SOLN
INTRAMUSCULAR | Status: DC | PRN
  Administered 2023-09-02 (×2): 100 ug via INTRAVENOUS
  Administered 2023-09-02: 50 ug via INTRAVENOUS

## 2023-09-02 MED ORDER — FENTANYL CITRATE (PF) 250 MCG/5ML IJ SOLN
INTRAMUSCULAR | Status: AC
  Filled 2023-09-02: qty 5

## 2023-09-02 MED ORDER — CHLORHEXIDINE GLUCONATE 0.12 % MT SOLN
15.0000 mL | Freq: Once | OROMUCOSAL | Status: AC
  Administered 2023-09-02: 15 mL via OROMUCOSAL
  Filled 2023-09-02: qty 15

## 2023-09-02 MED ORDER — HYDRALAZINE HCL 20 MG/ML IJ SOLN: 5.0000 mg | INTRAMUSCULAR | Status: DC | PRN

## 2023-09-02 MED ORDER — LACTATED RINGERS IV SOLN: INTRAVENOUS | Status: DC

## 2023-09-02 MED ORDER — SODIUM CHLORIDE 0.9 % IV SOLN
500.0000 mL | Freq: Once | INTRAVENOUS | Status: AC | PRN
  Administered 2023-09-03: 500 mL via INTRAVENOUS

## 2023-09-02 MED ORDER — ROCURONIUM BROMIDE 10 MG/ML (PF) SYRINGE
PREFILLED_SYRINGE | INTRAVENOUS | Status: DC | PRN
  Administered 2023-09-02: 50 mg via INTRAVENOUS
  Administered 2023-09-02: 20 mg via INTRAVENOUS

## 2023-09-02 MED ORDER — SODIUM CHLORIDE 0.9 % IV SOLN: INTRAVENOUS | Status: AC

## 2023-09-02 MED ORDER — 0.9 % SODIUM CHLORIDE (POUR BTL) OPTIME
TOPICAL | Status: DC | PRN
Start: 2023-09-02 — End: 2023-09-02
  Administered 2023-09-02: 2000 mL

## 2023-09-02 MED ORDER — CLOPIDOGREL BISULFATE 75 MG PO TABS
75.0000 mg | ORAL_TABLET | Freq: Every day | ORAL | Status: DC
  Administered 2023-09-03 – 2023-09-04 (×2): 75 mg via ORAL
  Filled 2023-09-02 (×2): qty 1

## 2023-09-02 MED ORDER — HEPARIN SODIUM (PORCINE) 1000 UNIT/ML IJ SOLN
INTRAMUSCULAR | Status: DC | PRN
  Administered 2023-09-02: 8000 [IU] via INTRAVENOUS
  Administered 2023-09-02: 3000 [IU] via INTRAVENOUS

## 2023-09-02 MED ORDER — OXYCODONE-ACETAMINOPHEN 5-325 MG PO TABS
1.0000 | ORAL_TABLET | ORAL | Status: DC | PRN
  Administered 2023-09-02 (×2): 1 via ORAL
  Administered 2023-09-03 – 2023-09-04 (×5): 2 via ORAL
  Filled 2023-09-02: qty 2
  Filled 2023-09-02: qty 1
  Filled 2023-09-02: qty 2
  Filled 2023-09-02: qty 1
  Filled 2023-09-02 (×3): qty 2

## 2023-09-02 MED ORDER — ACETAMINOPHEN 325 MG PO TABS: 325.0000 mg | ORAL_TABLET | ORAL | Status: DC | PRN

## 2023-09-02 MED ORDER — ONDANSETRON HCL 4 MG/2ML IJ SOLN
INTRAMUSCULAR | Status: DC | PRN
  Administered 2023-09-02: 4 mg via INTRAVENOUS

## 2023-09-02 MED ORDER — ALUM & MAG HYDROXIDE-SIMETH 200-200-20 MG/5ML PO SUSP: 15.0000 mL | ORAL | Status: DC | PRN

## 2023-09-02 MED ORDER — HEPARIN SODIUM (PORCINE) 5000 UNIT/ML IJ SOLN
5000.0000 [IU] | Freq: Three times a day (TID) | INTRAMUSCULAR | Status: DC
  Administered 2023-09-03 – 2023-09-04 (×4): 5000 [IU] via SUBCUTANEOUS
  Filled 2023-09-02 (×4): qty 1

## 2023-09-02 MED ORDER — ACETAMINOPHEN 10 MG/ML IV SOLN: 1000.0000 mg | Freq: Once | INTRAVENOUS | Status: DC | PRN

## 2023-09-02 MED ORDER — ACETAMINOPHEN 500 MG PO TABS
1000.0000 mg | ORAL_TABLET | Freq: Once | ORAL | Status: DC
  Filled 2023-09-02: qty 2

## 2023-09-02 MED ORDER — HEPARIN 6000 UNIT IRRIGATION SOLUTION
Status: DC | PRN
  Administered 2023-09-02: 1

## 2023-09-02 MED ORDER — PHENOL 1.4 % MT LIQD: 1.0000 | OROMUCOSAL | Status: DC | PRN

## 2023-09-02 MED ORDER — PROTAMINE SULFATE 10 MG/ML IV SOLN
INTRAVENOUS | Status: AC
  Filled 2023-09-02: qty 5

## 2023-09-02 MED ORDER — SUGAMMADEX SODIUM 200 MG/2ML IV SOLN
INTRAVENOUS | Status: DC | PRN
  Administered 2023-09-02: 200 mg via INTRAVENOUS

## 2023-09-02 MED ORDER — PANTOPRAZOLE SODIUM 40 MG PO TBEC
40.0000 mg | DELAYED_RELEASE_TABLET | Freq: Every day | ORAL | Status: DC
  Administered 2023-09-02 – 2023-09-04 (×3): 40 mg via ORAL
  Filled 2023-09-02 (×3): qty 1

## 2023-09-02 MED ORDER — GABAPENTIN 300 MG PO CAPS
300.0000 mg | ORAL_CAPSULE | Freq: Three times a day (TID) | ORAL | Status: DC | PRN
  Administered 2023-09-02 – 2023-09-04 (×3): 300 mg via ORAL
  Filled 2023-09-02 (×3): qty 1

## 2023-09-02 MED ORDER — MAGNESIUM SULFATE 2 GM/50ML IV SOLN: 2.0000 g | Freq: Every day | INTRAVENOUS | Status: DC | PRN

## 2023-09-02 MED ORDER — SODIUM CHLORIDE 0.9 % IV SOLN: INTRAVENOUS | Status: DC

## 2023-09-02 MED ORDER — SODIUM CHLORIDE (PF) 0.9 % IJ SOLN
INTRAVENOUS | Status: DC | PRN
  Administered 2023-09-02: 1 mL via INTRAMUSCULAR
  Administered 2023-09-02: 24 mL via INTRAMUSCULAR

## 2023-09-02 SURGICAL SUPPLY — 73 items
BAG COUNTER SPONGE SURGICOUNT (BAG) ×2 IMPLANT
BAG SNAP BAND KOVER 36X36 (MISCELLANEOUS) IMPLANT
BALLN MUSTANG 5X60X75 (BALLOONS) ×1 IMPLANT
BALLN MUSTANG 6X80X75 (BALLOONS) ×1 IMPLANT
BALLOON MUSTANG 5X60X75 (BALLOONS) IMPLANT
BALLOON MUSTANG 6X80X75 (BALLOONS) IMPLANT
BANDAGE ESMARK 6X9 LF (GAUZE/BANDAGES/DRESSINGS) IMPLANT
BLADE CLIPPER SURG (BLADE) ×2 IMPLANT
BNDG ESMARK 6X9 LF (GAUZE/BANDAGES/DRESSINGS) IMPLANT
CANISTER SUCT 3000ML PPV (MISCELLANEOUS) ×2 IMPLANT
CATH BEACON 5 .035 65 KMP TIP (CATHETERS) IMPLANT
CATH OMNI FLUSH .035X70CM (CATHETERS) IMPLANT
CLIP FOGARTY SPRING 6M (CLIP) ×2 IMPLANT
CLIP TI MEDIUM 24 (CLIP) ×2 IMPLANT
CLIP TI WIDE RED SMALL 24 (CLIP) ×2 IMPLANT
COVER PROBE W GEL 5X96 (DRAPES) ×2 IMPLANT
CUFF TOURN SGL QUICK 42 (TOURNIQUET CUFF) IMPLANT
CUFF TRNQT CYL 24X4X16.5-23 (TOURNIQUET CUFF) IMPLANT
CUFF TRNQT CYL 34X4.125X (TOURNIQUET CUFF) IMPLANT
DERMABOND ADVANCED .7 DNX12 (GAUZE/BANDAGES/DRESSINGS) ×2 IMPLANT
DEVICE TORQUE KENDALL .025-038 (MISCELLANEOUS) IMPLANT
DRAIN CHANNEL 15F RND FF W/TCR (WOUND CARE) IMPLANT
DRAPE C-ARM 42X72 X-RAY (DRAPES) ×2 IMPLANT
DRAPE HALF SHEET 40X57 (DRAPES) IMPLANT
ELECT REM PT RETURN 9FT ADLT (ELECTROSURGICAL) ×1 IMPLANT
ELECTRODE REM PT RTRN 9FT ADLT (ELECTROSURGICAL) ×2 IMPLANT
EVACUATOR SILICONE 100CC (DRAIN) IMPLANT
GLIDEWIRE ADV .035X180CM (WIRE) IMPLANT
GLIDEWIRE ADV .035X260CM (WIRE) IMPLANT
GLOVE BIO SURGEON STRL SZ7.5 (GLOVE) ×2 IMPLANT
GLOVE BIOGEL PI IND STRL 8 (GLOVE) ×2 IMPLANT
GOWN STRL REUS W/ TWL LRG LVL3 (GOWN DISPOSABLE) ×6 IMPLANT
GOWN STRL REUS W/ TWL XL LVL3 (GOWN DISPOSABLE) ×4 IMPLANT
GRAFT BALLN CATH 65CM (BALLOONS) IMPLANT
GRAFT VASC PATCH XENOSURE 1X14 (Vascular Products) IMPLANT
HEMOSTAT SNOW SURGICEL 2X4 (HEMOSTASIS) IMPLANT
HEMOSTAT SPONGE AVITENE ULTRA (HEMOSTASIS) IMPLANT
INSERT FOGARTY SM (MISCELLANEOUS) IMPLANT
KIT BASIN OR (CUSTOM PROCEDURE TRAY) ×2 IMPLANT
KIT TURNOVER KIT B (KITS) ×2 IMPLANT
LOOP VESSEL MINI RED (MISCELLANEOUS) IMPLANT
NS IRRIG 1000ML POUR BTL (IV SOLUTION) ×4 IMPLANT
PACK PERIPHERAL VASCULAR (CUSTOM PROCEDURE TRAY) ×2 IMPLANT
PAD ARMBOARD 7.5X6 YLW CONV (MISCELLANEOUS) ×4 IMPLANT
POWDER SURGICEL 3.0 GRAM (HEMOSTASIS) IMPLANT
SET MICROPUNCTURE 5F STIFF (MISCELLANEOUS) IMPLANT
SHEATH BRITE TIP 7FR 35CM (SHEATH) IMPLANT
SHEATH BRITE TIP 7FRX11 (SHEATH) IMPLANT
STENT ELUVIA 7X80X130 (Permanent Stent) IMPLANT
STENT VIABAHN 9X29 7FR 135 (Permanent Stent) IMPLANT
STENT VIABAHN 9X39 7FR 135 (Permanent Stent) IMPLANT
STOPCOCK 4 WAY LG BORE MALE ST (IV SETS) IMPLANT
STOPCOCK MORSE 400PSI 3WAY (MISCELLANEOUS) IMPLANT
SUT ETHILON 3 0 PS 1 (SUTURE) IMPLANT
SUT GORETEX 5 0 TT13 24 (SUTURE) IMPLANT
SUT GORETEX 6.0 TT13 (SUTURE) IMPLANT
SUT MNCRL AB 4-0 PS2 18 (SUTURE) ×6 IMPLANT
SUT PROLENE 5 0 C 1 24 (SUTURE) ×2 IMPLANT
SUT PROLENE 6 0 BV (SUTURE) ×2 IMPLANT
SUT PROLENE 7 0 BV 1 (SUTURE) IMPLANT
SUT SILK 2 0 PERMA HAND 18 BK (SUTURE) IMPLANT
SUT SILK 3-0 18XBRD TIE 12 (SUTURE) IMPLANT
SUT VIC AB 2-0 CT1 TAPERPNT 27 (SUTURE) ×4 IMPLANT
SUT VIC AB 3-0 SH 27X BRD (SUTURE) ×6 IMPLANT
SYR MEDRAD MARK V 150ML (SYRINGE) IMPLANT
TAPE UMBILICAL 1/8X30 (MISCELLANEOUS) IMPLANT
TOWEL GREEN STERILE (TOWEL DISPOSABLE) ×2 IMPLANT
TRAY FOLEY MTR SLVR 16FR STAT (SET/KITS/TRAYS/PACK) ×2 IMPLANT
TUBING EXTENTION W/L.L. (IV SETS) IMPLANT
TUBING INJECTOR 48 (MISCELLANEOUS) IMPLANT
UNDERPAD 30X36 HEAVY ABSORB (UNDERPADS AND DIAPERS) ×2 IMPLANT
WATER STERILE IRR 1000ML POUR (IV SOLUTION) ×2 IMPLANT
WIRE BENTSON .035X145CM (WIRE) IMPLANT

## 2023-09-02 NOTE — Anesthesia Postprocedure Evaluation (Signed)
 Anesthesia Post Note  Patient: Bradley Ferguson  Procedure(s) Performed: LEFT FEMORAL ENDARTARECTOMY WITH PATCH ANGIOPLASTY (Left: Groin) LEFT LOWER EXTREMITY ANGIOGRAM WITH LEFT ILIAC  STENTING (Left: Groin)     Patient location during evaluation: PACU Anesthesia Type: General Level of consciousness: awake and alert Pain management: pain level controlled Vital Signs Assessment: post-procedure vital signs reviewed and stable Respiratory status: spontaneous breathing, nonlabored ventilation, respiratory function stable and patient connected to nasal cannula oxygen Cardiovascular status: blood pressure returned to baseline and stable Postop Assessment: no apparent nausea or vomiting Anesthetic complications: no  No notable events documented.  Last Vitals:  Vitals:   09/02/23 1700 09/02/23 1800  BP: 105/68 117/67  Pulse: 72 78  Resp: 13 18  Temp:    SpO2: 97% 95%    Last Pain:  Vitals:   09/02/23 1625  TempSrc: Oral  PainSc: 4                  Shelton Silvas

## 2023-09-02 NOTE — Progress Notes (Signed)
 Pt received from PACU, patient is alert and oriented *4, vital's obtained, CCMD notified, orientation given about the unit, call bell in reach.   09/02/23 1625  Vitals  Temp (!) 97.5 F (36.4 C)  Temp Source Oral  BP 100/64  MAP (mmHg) 77  BP Location Right Arm  BP Method Automatic  Patient Position (if appropriate) Lying  Pulse Rate 78  Pulse Rate Source Monitor  ECG Heart Rate 78  Resp 16  Level of Consciousness  Level of Consciousness Alert  MEWS COLOR  MEWS Score Color Green  Oxygen Therapy  SpO2 95 %  O2 Device Room Air  Art Line  Arterial Line BP 136/54  Arterial Line MAP (mmHg) 78 mmHg  Pain Assessment  Pain Scale 0-10  Pain Score 4  Pain Type Surgical pain  Pain Location Groin  Pain Orientation Left  Pain Descriptors / Indicators Aching  Pain Frequency Intermittent  Pain Onset Gradual  Patients Stated Pain Goal 0  Pain Intervention(s) Medication (See eMAR)  MEWS Score  MEWS Temp 0  MEWS Systolic 1  MEWS Pulse 0  MEWS RR 0  MEWS LOC 0  MEWS Score 1
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
## 2023-09-02 NOTE — Anesthesia Procedure Notes (Signed)
 Procedure Name: Intubation Date/Time: 09/02/2023 8:59 AM  Performed by: Hali Marry, CRNAPre-anesthesia Checklist: Patient identified, Emergency Drugs available, Suction available and Patient being monitored Patient Re-evaluated:Patient Re-evaluated prior to induction Oxygen Delivery Method: Circle system utilized Preoxygenation: Pre-oxygenation with 100% oxygen Induction Type: IV induction Ventilation: Mask ventilation without difficulty Laryngoscope Size: Mac and 4 Grade View: Grade I Tube type: Oral Number of attempts: 1 Airway Equipment and Method: Stylet and Oral airway Placement Confirmation: ETT inserted through vocal cords under direct vision, positive ETCO2 and breath sounds checked- equal and bilateral Secured at: 23 cm Tube secured with: Tape Dental Injury: Teeth and Oropharynx as per pre-operative assessment

## 2023-09-02 NOTE — Plan of Care (Signed)
  Problem: Clinical Measurements: Goal: Diagnostic test results will improve Outcome: Progressing Goal: Cardiovascular complication will be avoided Outcome: Progressing   

## 2023-09-02 NOTE — Op Note (Addendum)
 Date: September 02, 2023  Preoperative diagnosis:  Disabling left lower extremity claudication 2.   Iliac occlusive disease 3.   Common femoral disease  Postoperative diagnosis: Same  Procedure: 1.  Aortogram with left iliac arteriogram 2.  Angioplasty and stent of left common iliac artery via retrograde access (9 mm x 39 mm VBX and 9 mm x 29 mm VBX) 3.  Angioplasty and stent of the left external iliac artery via retrograde access (7 mm x 80 mm drug-coated Eluvia postdilated with a 6 mm angioplasty balloon) 4.  Left common femoral endarterectomy including profundoplasty and endarterectomy the proximal SFA with bovine pericardial patch angioplasty  Surgeon: Dr. [name], MD  Assistant: Aggie Moats, PA  Indications: 73 year old male with disabling left lower extremity claudication with evidence of high-grade left common iliac and external iliac stenosis >90% as well as subtotal occlusion of the left common femoral with significant peripheral disease and flush SFA occlusion.  He presents for left lower extremity revascularization after risks benefits discussed.  An assistant was needed given the complexity of the case and also for multilevel revascularization including endarterectomy and retrograde stenting.  Findings: Transverse incision in the left groin.  I dissected out the common femoral artery as well as the SFA and profunda.  I then accessed the left common femoral artery retrograde under fluoroscopic guidance and got a wire through iliac disease retrograde.  I then performed extensive left common femoral endarterectomy including distal external iliac endarterectomy and endarterectomy of the proximal SFA onto the profunda.  I then stented retrograde the left common artery with several 9 mm VBX stents for over 90% stenosis and then I placed a 7 mm Eluvia in the external iliac artery also for an over 90% stenosis.  I then sewed a bovine patch to the left common femoral artery onto the  profunda.  Palpable femoral pulse with brisk Doppler flow in the profunda.  Anesthesia: General  Details: Patient was taken to the operating room after informed consent was obtained.  Placed on operative table in the supine position.  General endotracheal anesthesia was induced.  I did mark out the common femoral artery in the left groin.  The left groin was then prepped and draped in standard sterile fashion including the left abdominal wall.  Antibiotics were given and timeout performed.  Initially made a transverse incision in the left groin.  Dissected down with Bovie cautery and used cerebellar retractors.  The common femoral artery was dissected out including the SFA and profunda as well as the distal external iliac artery.  I controlled all these with Vesseloops.  I did dissect out about 5 cm onto the profunda as this was the dominant runoff as the SFA has a flush occlusion on previous imaging.  Patient was given 100 units/kg IV heparin.  I then used a fluoroscopic C arm and I accessed the left common femoral artery with micro access retrograde and placed a microwire and a microsheath.  I tried to get a Glidewire advantage wire retrograde but I met resistance with the iliac disease.  I then upsized to a short 7 [place] sheath and used a angled KMP catheter to get my wire through the left iliac disease retrograde into the abdominal aorta.  I did obtain aortogram with left iliac arteriogram.  Patient does have a small abdominal aneurysm.  The left hypogastric was occluded on initial imaging.  I then went ahead and clamped the external iliac with a Henley clamp and then used vascular  clamps on the profunda distally at the branch level.  Arteriotomy was carried from the common femoral onto the profunda out to the primary branch points with potts scissors.  I then used a Runner, broadcasting/film/video and performed endarterectomy of the common femoral, distal external iliac, proximal SFA and profunda out to the branch  points.  We had good backbleeding from the profunda.  I then elected to stent the left iliac lesion.  I tried to get a long Brite tip sheath to get retrograde but had to predilate left iliac with a 5 mm angioplasty balloon.  Finally got my Brite tip sheath up to the proximal left common iliac artery.  Initially selected a 9 mm x 39 mm VBX that was deployed in the proximal left common iliac artery.  I then had to extend this with a second 9 mm x 29 mm VBX.  The hypogastric was occluded chronically.  There was significant high-grade stenosis in the proximal external iliac I elected to stent the external iliac with a a 7 mm x 80 mm drug-coated Eluvia postdilated with a 6 mm Mustang.  Widely patent left iliac vessel at completion.  I then removed my wires and catheters and used a Henley clamp to control what was excellent pulsatile inflow.  We had good backbleeding from the profunda after we evaluated our endpoints.  I then brought a long bovine patch on the field and this was sewn in place with 5-0 Prolene parachute technique from the profunda into the common femoral and this was de-aired prior to completion with the help of my assistant.  We had an excellent left femoral pulse with brisk Doppler flow in the profunda.  There was flow in the SFA although this was high resistance.  I then checked the foot and had good signals in the left foot.  Protamine was given for reversal.  I irrigated out the groin and used Surgicel snow and Surgicel powder.  I did place several additional repair sutures in the patch.  I then closed the groin in multiple layers of 2-0 Vicryl, 3-0 Vicryl, 4-0 Monocryl and Dermabond.  Taken to recovery in stable condition.  Brisk Doppler signals in the left foot.  Complication: None  Condition: Stable  [name], MD [facility] Office: [phone]   [name]

## 2023-09-02 NOTE — Plan of Care (Signed)
  Problem: Clinical Measurements: Goal: Ability to maintain clinical measurements within normal limits will improve Outcome: Progressing Goal: Will remain free from infection Outcome: Progressing Goal: Diagnostic test results will improve Outcome: Progressing Goal: Respiratory complications will improve Outcome: Progressing Goal: Cardiovascular complication will be avoided Outcome: Progressing   Problem: Clinical Measurements: Goal: Will remain free from infection Outcome: Progressing   Problem: Clinical Measurements: Goal: Diagnostic test results will improve Outcome: Progressing   Problem: Nutrition: Goal: Adequate nutrition will be maintained Outcome: Progressing

## 2023-09-02 NOTE — Transfer of Care (Signed)
 Immediate Anesthesia Transfer of Care Note  Patient: Bradley Ferguson  Procedure(s) Performed: LEFT FEMORAL ENDARTARECTOMY WITH PATCH ANGIOPLASTY (Left: Groin) LEFT LOWER EXTREMITY ANGIOGRAM WITH LEFT ILIAC  STENTING (Left: Groin)  Patient Location: PACU  Anesthesia Type:General  Level of Consciousness: awake  Airway & Oxygen Therapy: Patient Spontanous Breathing and Patient connected to nasal cannula oxygen  Post-op Assessment: Report given to RN and Post -op Vital signs reviewed and stable  Post vital signs: Reviewed and stable  Last Vitals:  Vitals Value Taken Time  BP 106/55 09/02/23 1222  Temp    Pulse 67 09/02/23 1225  Resp 16 09/02/23 1225  SpO2 88 % 09/02/23 1225  Vitals shown include unfiled device data.  Last Pain:  Vitals:   09/02/23 0720  TempSrc:   PainSc: 0-No pain         Complications: No notable events documented.

## 2023-09-03 ENCOUNTER — Encounter (HOSPITAL_COMMUNITY): Payer: Self-pay | Admitting: Vascular Surgery

## 2023-09-03 LAB — CBC
HCT: 26.9 % — ABNORMAL LOW (ref 39.0–52.0)
Hemoglobin: 9 g/dL — ABNORMAL LOW (ref 13.0–17.0)
MCH: 30.4 pg (ref 26.0–34.0)
MCHC: 33.5 g/dL (ref 30.0–36.0)
MCV: 90.9 fL (ref 80.0–100.0)
Platelets: 90 10*3/uL — ABNORMAL LOW (ref 150–400)
RBC: 2.96 MIL/uL — ABNORMAL LOW (ref 4.22–5.81)
RDW: 14.5 % (ref 11.5–15.5)
WBC: 6.9 10*3/uL (ref 4.0–10.5)
nRBC: 0 % (ref 0.0–0.2)

## 2023-09-03 LAB — BASIC METABOLIC PANEL
Anion gap: 3 — ABNORMAL LOW (ref 5–15)
BUN: 12 mg/dL (ref 8–23)
CO2: 24 mmol/L (ref 22–32)
Calcium: 7.7 mg/dL — ABNORMAL LOW (ref 8.9–10.3)
Chloride: 108 mmol/L (ref 98–111)
Creatinine, Ser: 0.87 mg/dL (ref 0.61–1.24)
GFR, Estimated: >60 mL/min (ref >60)
Glucose, Bld: 119 mg/dL — ABNORMAL HIGH (ref 70–99)
Potassium: 4 mmol/L (ref 3.5–5.1)
Sodium: 135 mmol/L (ref 135–145)

## 2023-09-03 NOTE — Evaluation (Signed)
 Occupational Therapy Evaluation Patient Details Name: Bradley Ferguson MRN: [id number] DOB: [birth year] Today's Date: 09/03/2023   History of Present Illness   73 y.o. male presents to [facility] 09/02/23 for L femoral endarectomy / patch angioplasty and  LLE angiogram with L iliac stenting. PMHx: a-fib on xarelto, tobacco abuse, hyperlipidemia, systemic sclerosis     Clinical Impressions At baseline, pt is Independent with ADLs, IADLs, and functional mobility without an AD, and drives. Pt now presents with decreased activity tolerance, decreased balance, pain affecting functional level, mildly impaired safety awareness, decreased knowledge of use of AD for functional mobility, and decreased safety and independence with functional tasks. Pt currently demonstrates ability to complete UB ADLs Independent to Contact guard assist, LB ADLs with Contact guard to Min assist, and functional mobility/transfers with a RW with Contact guard assist. Pt participated well in session and is motivated to return to baseline PLOF. Orthostatics taken this session with no significant findings and with all VSS on RA throughout session. Pt will benefit from acute skilled OT services to address deficits outlined below and to increase safety and independence with functional tasks. No post-acute skilled OT needs are anticipated at this time.       If plan is discharge home, recommend the following:   Assistance with cooking/housework;Assist for transportation;Help with stairs or ramp for entrance;A little help with walking and/or transfers;A little help with bathing/dressing/bathroom     Functional Status Assessment   Patient has had a recent decline in their functional status and demonstrates the ability to make significant improvements in function in a reasonable and predictable amount of time.     Equipment Recommendations   None recommended by OT     Recommendations for Other Services          Precautions/Restrictions   Precautions Precautions: Fall Restrictions Weight Bearing Restrictions Per Provider Order: No     Mobility Bed Mobility Overal bed mobility: Needs Assistance Bed Mobility: Supine to Sit, Sit to Supine     Supine to sit: Supervision, HOB elevated Sit to supine: Supervision, HOB elevated   General bed mobility comments: supervision for safety, HOB elevated. Increased time    Transfers Overall transfer level: Needs assistance Equipment used: Rolling walker (2 wheels) Transfers: Sit to/from Stand, Bed to chair/wheelchair/BSC Sit to Stand: Contact guard assist     Step pivot transfers: Contact guard assist     General transfer comment: CGA for safety, cues for hand placement with RW      Balance Overall balance assessment: Needs assistance Sitting-balance support: No upper extremity supported, Feet supported Sitting balance-Leahy Scale: Good     Standing balance support: Bilateral upper extremity supported, During functional activity, Reliant on assistive device for balance Standing balance-Leahy Scale: Fair Standing balance comment: able to stand statically w/ no AD, RW for gait                           ADL either performed or assessed with clinical judgement   ADL Overall ADL's : Needs assistance/impaired Eating/Feeding: Independent;Sitting   Grooming: Wash/dry hands;Wash/dry face;Oral care;Supervision/safety;Contact guard assist;Standing Grooming Details (indicate cue type and reason): Close Supervision to CGA for safety Upper Body Bathing: Supervision/ safety;Sitting   Lower Body Bathing: Contact guard assist;Minimal assistance;Sitting/lateral leans;Sit to/from stand Lower Body Bathing Details (indicate cue type and reason): laregely CGA with Min assist for washing/drying feet Upper Body Dressing : Contact guard assist;Sitting Upper Body Dressing Details (indicate cue type and reason): assist  for managing telematry  cords; otherwise Mod I Lower Body Dressing: Contact guard assist;Minimal assistance;Sitting/lateral leans;Sit to/from stand Lower Body Dressing Details (indicate cue type and reason): largely CGA with Min assist for donnnig/doffing socks Toilet Transfer: Contact guard assist;Regular Toilet;Rolling walker (2 wheels);Ambulation;Cueing for safety   Toileting- Clothing Manipulation and Hygiene: Contact guard assist;Sit to/from stand       Functional mobility during ADLs: Contact guard assist;Rolling walker (2 wheels);Cueing for safety General ADL Comments: Pt with decreased activity tolerance and pain at incision site affecting functional level     Vision Baseline Vision/History: 1 Wears glasses (readers) Ability to See in Adequate Light: 0 Adequate Patient Visual Report: No change from baseline       Perception         Praxis         Pertinent Vitals/Pain Pain Assessment Pain Assessment: Faces Faces Pain Scale: Hurts little more Pain Location: R 5th metatarsal head Pain Descriptors / Indicators: Discomfort Pain Intervention(s): Limited activity within patient's tolerance, Monitored during session, Repositioned     Extremity/Trunk Assessment Upper Extremity Assessment Upper Extremity Assessment: Right hand dominant;Overall WFL for tasks assessed;RUE deficits/detail;LUE deficits/detail (Pt reports hx of B elbow injuries and Raynauld's, but with no issues present this day.) RUE Deficits / Details: strength, ROM, sensation, and coordination all WFL; mildly edematous hand LUE Deficits / Details: strength, ROM, sensation, and coordination all WFL; mildly edematous hand   Lower Extremity Assessment Lower Extremity Assessment: Defer to PT evaluation   Cervical / Trunk Assessment Cervical / Trunk Assessment: Normal   Communication Communication Communication: No apparent difficulties   Cognition Arousal: Alert Behavior During Therapy: WFL for tasks  assessed/performed Cognition: No apparent impairments             OT - Cognition Comments: Pt AAOx4 and pleasant throught session. Cognition largely WNL for tasks assessed with pt noted to demonstrate occasional impulsiveness and mildly impaired safety awareness. Suspect this is likely pt's baseline.                 Following commands: Intact       Cueing  General Comments   Cueing Techniques: Verbal cues  VSS on RA throughout session. Orthos taken due to pat with orthostatic hypotension earlier this day. Pt's BP in supine was 110/54, in sitting was 123/99, and in standing was 125/66.   Exercises     Shoulder Instructions      Home Living Family/patient expects to be discharged to:: Private residence Living Arrangements: Non-relatives/Friends Available Help at Discharge: Friend(s);Available PRN/intermittently (rents a room in a larger house) Type of Home: House Home Access: Stairs to enter [employer] of Steps: 2 +1 Entrance Stairs-Rails: Left Home Layout: One level;Laundry or work area in basement (stays on main level)     Bathroom Shower/Tub: Tub/shower unit;Door   [employer] Toilet: Standard     Home Equipment: Other (comment) (planning to install a grab bar in tub/shower)          Prior Functioning/Environment Prior Level of Function : Independent/Modified Independent;Driving             Mobility Comments: Independent without an AD ADLs Comments: Independent    OT Problem List: Decreased activity tolerance;Impaired balance (sitting and/or standing);Decreased safety awareness;Decreased knowledge of use of DME or AE;Pain   OT Treatment/Interventions: Self-care/ADL training;Energy conservation;DME and/or AE instruction;Therapeutic activities;Patient/family education;Balance training      OT Goals(Current goals can be found in the care plan section)   Acute Rehab OT Goals Patient  Stated Goal: to return home and be able to walk longer  distances OT Goal Formulation: With patient Time For Goal Achievement: 09/17/23 Potential to Achieve Goals: Good ADL Goals Pt Will Perform Grooming: with modified independence;standing Pt Will Perform Lower Body Bathing: with supervision;sitting/lateral leans;sit to/from stand Pt Will Perform Lower Body Dressing: with modified independence;sitting/lateral leans;sit to/from stand Pt Will Transfer to Toilet: with modified independence;ambulating;regular height toilet;grab bars (with least restrictive AD) Pt Will Perform Toileting - Clothing Manipulation and hygiene: with modified independence;sit to/from stand;sitting/lateral leans   OT Frequency:  Min 2X/week    Co-evaluation PT/OT/SLP Co-Evaluation/Treatment: Yes Reason for Co-Treatment: For patient/therapist safety;To address functional/ADL transfers PT goals addressed during session: Mobility/safety with mobility OT goals addressed during session: ADL's and self-care      AM-PAC OT "6 Clicks" Daily Activity     Outcome Measure Help from another person eating meals?: None Help from another person taking care of personal grooming?: A Little Help from another person toileting, which includes using toliet, bedpan, or urinal?: A Little Help from another person bathing (including washing, rinsing, drying)?: A Little Help from another person to put on and taking off regular upper body clothing?: A Little Help from another person to put on and taking off regular lower body clothing?: A Little 6 Click Score: 19   End of Session Equipment Utilized During Treatment: Rolling walker (2 wheels) Nurse Communication: Mobility status  Activity Tolerance: Patient tolerated treatment well Patient left: in bed;with call bell/phone within reach;with bed alarm set  OT Visit Diagnosis: Unsteadiness on feet (R26.81);Other (comment) (decreased activity tolerance)                Time: 1610-9604 OT Time Calculation (min): 40 min Charges:  OT General  Charges $OT Visit: 1 Visit OT Evaluation $OT Eval Low Complexity: 1 Low OT Treatments $Self Care/Home Management : 8-22 mins  Theordore Cisnero "Orson Eva., OTR/L, MA Acute Rehab [phone]  Lendon Colonel 09/03/2023, 12:43 PM

## 2023-09-03 NOTE — Progress Notes (Addendum)
  Progress Note    09/03/2023 8:07 AM 1 Day Post-Op  Subjective:  L groin pain   Vitals:   09/02/23 2358 09/03/23 0313  BP: (!) 97/55 (!) 90/57  Pulse: 74 72  Resp: 17 17  Temp: 98.2 F (36.8 C) 97.6 F (36.4 C)  SpO2: 95% 93%   Physical Exam: Lungs:  non labored Incisions:  L groin incision c/d/I some ecchymosis but no firm hematoma Extremities:  brisk L DP and PT by doppler Neurologic: A&O  CBC    Component Value Date/Time   WBC 6.9 09/03/2023 0420   RBC 2.96 (L) 09/03/2023 0420   HGB 9.0 (L) 09/03/2023 0420   HGB 14.4 06/16/2023 0927   HCT 26.9 (L) 09/03/2023 0420   HCT 44.2 06/16/2023 0927   PLT 90 (L) 09/03/2023 0420   PLT 180 06/16/2023 0927   MCV 90.9 09/03/2023 0420   MCV 93 06/16/2023 0927   MCH 30.4 09/03/2023 0420   MCHC 33.5 09/03/2023 0420   RDW 14.5 09/03/2023 0420   RDW 13.4 06/16/2023 0927   LYMPHSABS 2.2 06/16/2023 0927   MONOABS 630 02/05/2017 1234   EOSABS 260 07/06/2023 1433   EOSABS 0.4 06/16/2023 0927   BASOSABS 62 07/06/2023 1433   BASOSABS 0.1 06/16/2023 0927    BMET    Component Value Date/Time   NA 135 09/03/2023 0420   NA 141 06/16/2023 0927   K 4.0 09/03/2023 0420   CL 108 09/03/2023 0420   CO2 24 09/03/2023 0420   GLUCOSE 119 (H) 09/03/2023 0420   BUN 12 09/03/2023 0420   BUN 14 06/16/2023 0927   CREATININE 0.87 09/03/2023 0420   CREATININE 0.97 07/06/2023 1433   CALCIUM 7.7 (L) 09/03/2023 0420   GFRNONAA >60 09/03/2023 0420   GFRNONAA 70 11/12/2020 0000   GFRAA 82 11/12/2020 0000    INR    Component Value Date/Time   INR 1.6 (H) 08/18/2023 1332   INR 3.2 06/06/2010 1032     Intake/Output Summary (Last 24 hours) at 09/03/2023 1610 Last data filed at 09/03/2023 9604 Gross per 24 hour  Intake 2562.26 ml  Output 1350 ml  Net 1212.26 ml     Assessment/Plan:  73 y.o. male is s/p L CFA endarterectomy with bovine vein patch and iliac stenting 1 Day Post-Op   LLE well perfused with brisk DP and PT signals by  doppler L groin incision well appearing D/c a line D/c foley OOB this morning Plavix started for iliac stenting Possible discharge home this afternoon   Emilie Rutter, PA-C [facility] [phone] 09/03/2023 8:07 AM  I have seen and evaluated the patient. I agree with the PA note as documented above.  Postop day 1 status post left common femoral endarterectomy with profundoplasty and retrograde left iliac stenting for multilevel occlusive disease with disabling claudication.  Palpable left femoral pulse.  Groin looks good.  He has very brisk left AT DP signal in the foot.  Needs to get out of bed and mobilize today and has not walked.  Remove A-line.  Remove Foley catheter.  Discussed aspirin Plavix statin for his iliac stents.  Will see how he does today but likely needs another day.  [name], MD [facility] [place] Office: [phone]

## 2023-09-03 NOTE — Progress Notes (Signed)
 Patient's b/p in 90's most of evening. Patient has had several doses of pain meds (see MAR) for times and amounts.  Patient is alert and oriented. Wakes up easily.   500 Bolus given per order. Will recheck b/p once bolus is done.  A-line remains in at this time d/t b/p.   Patient requested that the foley does not get removed until he is able to get pain meds. Informed patient that depending on his b/p will determine if he can get the pain meds.  Patient understood.

## 2023-09-03 NOTE — Evaluation (Signed)
 Physical Therapy Evaluation Patient Details Name: Bradley Ferguson MRN: [id number] DOB: 04-27-1951 Today's Date: 09/03/2023  History of Present Illness  73 y.o. male presents to [facility] 09/02/23 for L femoral endarectomy / patch angioplasty and  LLE angiogram with L iliac stenting. PMHx: a-fib on xarelto, tobacco abuse, hyperlipidemia, systemic sclerosis  Clinical Impression  Pt in bed upon arrival and agreeable to PT eval. PTA, pt was independent with no AD. Pt was having increased difficulty ambulating due to pain in B LE. In today's session, pt was able to stand and ambulate ~250 ft with CGA and RW. Orthostatic vitals taken with no significant findings, in vital signs. Pt currently with functional limitations due to the deficits listed below (see PT Problem List). Pt would benefit from acute skilled PT to address functional impairments. Anticipating no follow up PT pending continued progress with mobility. Acute PT to follow.        If plan is discharge home, recommend the following: Assist for transportation;Help with stairs or ramp for entrance   Can travel by private vehicle   Yes     Equipment Recommendations Rolling walker (2 wheels)     Functional Status Assessment Patient has had a recent decline in their functional status and demonstrates the ability to make significant improvements in function in a reasonable and predictable amount of time.     Precautions / Restrictions Precautions Precautions: Fall Restrictions Weight Bearing Restrictions Per Provider Order: No      Mobility  Bed Mobility Overal bed mobility: Needs Assistance Bed Mobility: Supine to Sit    Supine to sit: Supervision, HOB elevated     General bed mobility comments: supervision for safety, HOB elevated. Increased time    Transfers Overall transfer level: Needs assistance Equipment used: Rolling walker (2 wheels) Transfers: Sit to/from Stand Sit to Stand: Contact guard assist      General transfer  comment: CGA for safety, cues for hand placement with RW    Ambulation/Gait Ambulation/Gait assistance: Contact guard assist Gait Distance (Feet): 250 Feet Assistive device: Rolling walker (2 wheels) Gait Pattern/deviations: Step-through pattern, Decreased stance time - left, Decreased weight shift to left, Trunk flexed Gait velocity: decr     General Gait Details: initially pt had decreased weight shift to the left w/ decreased stance time. Improved with increased gait distance    Balance Overall balance assessment: Needs assistance Sitting-balance support: No upper extremity supported, Feet supported Sitting balance-Leahy Scale: Good     Standing balance support: Bilateral upper extremity supported, During functional activity, Reliant on assistive device for balance Standing balance-Leahy Scale: Fair Standing balance comment: able to stand statically w/ no AD, RW for gait        Pertinent Vitals/Pain Pain Assessment Pain Assessment: Faces Faces Pain Scale: Hurts little more Pain Location: R 5th metatarsal head Pain Descriptors / Indicators: Discomfort    Home Living Family/patient expects to be discharged to:: Private residence Living Arrangements: Non-relatives/Friends Available Help at Discharge: Friend(s);Available PRN/intermittently (rents a room in a larger house) Type of Home: House Home Access: Stairs to enter Entrance Stairs-Rails: Left Entrance Stairs-Number of Steps: 2 +1   Home Layout: One level;Laundry or work area in basement (stays on main level) Home Equipment: Other (comment) (planning to install a grab bar in tub/shower)      Prior Function Prior Level of Function : Independent/Modified Independent;Driving      Mobility Comments: Independent without an AD ADLs Comments: Independent     Extremity/Trunk Assessment   Upper Extremity  Assessment Upper Extremity Assessment: Defer to OT evaluation    Lower Extremity Assessment Lower Extremity  Assessment: Overall WFL for tasks assessed (reports decresed sensation in B feet that was present prior to admit)    Cervical / Trunk Assessment Cervical / Trunk Assessment: Normal  Communication   Communication Communication: No apparent difficulties    Cognition Arousal: Alert Behavior During Therapy: WFL for tasks assessed/performed   PT - Cognitive impairments: No apparent impairments    Following commands: Intact       Cueing Cueing Techniques: Verbal cues      PT Assessment Patient needs continued PT services  PT Problem List Decreased activity tolerance;Decreased balance;Decreased mobility;Decreased knowledge of use of DME       PT Treatment Interventions DME instruction;Gait training;Stair training;Functional mobility training;Therapeutic activities;Balance training;Therapeutic exercise;Neuromuscular re-education;Patient/family education    PT Goals (Current goals can be found in the Care Plan section)  Acute Rehab PT Goals Patient Stated Goal: to be able to walk further PT Goal Formulation: With patient Time For Goal Achievement: 09/17/23 Potential to Achieve Goals: Good    Frequency Min 3X/week     Co-evaluation   Reason for Co-Treatment: For patient/therapist safety;To address functional/ADL transfers PT goals addressed during session: Mobility/safety with mobility OT goals addressed during session: ADL's and self-care       AM-PAC PT "6 Clicks" Mobility  Outcome Measure Help needed turning from your back to your side while in a flat bed without using bedrails?: None Help needed moving from lying on your back to sitting on the side of a flat bed without using bedrails?: A Little Help needed moving to and from a bed to a chair (including a wheelchair)?: A Little Help needed standing up from a chair using your arms (e.g., wheelchair or bedside chair)?: A Little Help needed to walk in hospital room?: A Little Help needed climbing 3-5 steps with a  railing? : A Little 6 Click Score: 19    End of Session   Activity Tolerance: Patient tolerated treatment well Patient left: Other (comment) (with OT in bathroom) Nurse Communication: Mobility status PT Visit Diagnosis: Other abnormalities of gait and mobility (R26.89)    Time: 1610-9604 PT Time Calculation (min) (ACUTE ONLY): 26 min   Charges:   PT Evaluation $PT Eval Low Complexity: 1 Low   PT General Charges $$ ACUTE PT VISIT: 1 Visit       Hilton Cork, PT, DPT Secure [facility] Office [phone]   Arturo Morton Brion Aliment 09/03/2023, 12:14 PM

## 2023-09-04 ENCOUNTER — Other Ambulatory Visit (HOSPITAL_COMMUNITY): Payer: Self-pay

## 2023-09-04 MED ORDER — OXYCODONE-ACETAMINOPHEN 5-325 MG PO TABS
1.0000 | ORAL_TABLET | ORAL | 0 refills | Status: DC | PRN
  Filled 2023-09-04: qty 30, 5d supply, fill #0

## 2023-09-04 MED ORDER — CLOPIDOGREL BISULFATE 75 MG PO TABS
75.0000 mg | ORAL_TABLET | Freq: Every day | ORAL | 1 refills | Status: DC
  Filled 2023-09-04: qty 30, 30d supply, fill #0

## 2023-09-04 MED ORDER — RIVAROXABAN 20 MG PO TABS
ORAL_TABLET | ORAL | 3 refills | Status: DC
  Filled 2023-09-04: qty 90, 90d supply, fill #0

## 2023-09-04 NOTE — Discharge Summary (Signed)
 Discharge Summary  Patient ID: Bradley Ferguson [id number] 73 y.o. 12/18/1950  Admit date: 09/02/2023  Discharge date and time: 09/04/2023 11:09 AM   Admitting Physician: Bradley Shelling, MD   Discharge Physician: same  Admission Diagnoses: Aortoiliac occlusive disease (HCC) [I74.09]  Discharge Diagnoses: same  Admission Condition: fair  Discharged Condition: fair  Indication for Admission: post op care  Hospital Course: [name] is a 73 year old male who was brought in as an outpatient and underwent angioplasty and stenting of the left common and external iliac artery with left common femoral artery endarterectomy with bovine patch angioplasty by Dr. Chestine Spore on 09/02/2023.  He tolerated the procedure well and was admitted to the hospital postoperatively.  POD #1 left foot was well-perfused with brisk Doppler flow in the PT and DP.  He was kept an additional night for increasing mobility.  Postoperative day #2 incision was well-appearing.  He maintained brisk Doppler flow into the left foot.  He was ready for discharge home.  He was started on Plavix after surgery and will restart his Xarelto at discharge.  We will discontinue his aspirin for now.  He will follow-up in the office for incision check in about 2 to 3 weeks.  It should also be noted he was prescribed narcotic pain medication for continued postoperative pain control.  He was discharged home in stable condition.  Consults: None  Treatments: surgery: Angioplasty and stenting of the left common and external iliac artery with left common femoral endarterectomy with bovine patch angioplasty by Dr. Chestine Spore on 09/02/2023  Discharge Exam: See progress note 09/04/23 Vitals:   09/04/23 0348 09/04/23 0817  BP: 118/61 (!) 99/57  Pulse:  77  Resp: 19 18  Temp: 98.2 F (36.8 C) 99.1 F (37.3 C)  SpO2: 95%      Disposition: Discharge disposition: 01-Home or Self Care       Patient Instructions:  Allergies as of 09/04/2023        Reactions   Cymbalta [duloxetine Hcl]    Effexor [venlafaxine]    Feels bad   Wellbutrin [bupropion]    sleepy   Zoloft [sertraline Hcl]    Felt bad        Medication List     STOP taking these medications    aspirin EC 81 MG tablet       TAKE these medications    albuterol 108 (90 Base) MCG/ACT inhaler Commonly known as: VENTOLIN HFA Use  2 inhalations  15 minutes apart  every 4 hours  to rescue Asthma Attack   atorvastatin 80 MG tablet Commonly known as: LIPITOR Take 1 tablet Daily for Cholesterol   buPROPion 150 MG 12 hr tablet Commonly known as: WELLBUTRIN SR TAKE 1 TABLET BY MOUTH DAILY FOR 3 DAYS THEN INCREASE TO 1 TABLET BY MOUTH TWICE A DAY.   Cholecalciferol 250 MCG (10000 UT) Caps Take 10,000 Units by mouth daily.   clopidogrel 75 MG tablet Commonly known as: PLAVIX Take 1 tablet (75 mg total) by mouth daily at 6 (six) AM. Start taking on: September 05, 2023   dofetilide 500 MCG capsule Commonly known as: TIKOSYN TAKE 1 CAPSULE BY MOUTH 2 TIMES DAILY.   gabapentin 300 MG capsule Commonly known as: Neurontin Take 1 capsule 3 x /day for Neuropathy Pain   ketoconazole 2 % cream Commonly known as: NIZORAL Apply 1 Application topically daily as needed (Fungus).   oxyCODONE-acetaminophen 5-325 MG tablet Commonly known as: PERCOCET/ROXICET Take 1 tablet by mouth every  4 (four) hours as needed for moderate pain (pain score 4-6).   pyridOXINE 100 MG tablet Commonly known as: VITAMIN B6 Take 100 mg by mouth daily.   rivaroxaban 20 MG Tabs tablet Commonly known as: Xarelto Take 1 tablet by mouth daily to Prevent Blood Clots Start taking on: September 06, 2023 What changed:  additional instructions These instructions start on September 06, 2023. If you are unsure what to do until then, ask your doctor or other care provider.   triamcinolone cream 0.5 % Commonly known as: KENALOG Apply 1 Application topically daily as needed (skin irritation).        Activity: activity as tolerated Diet: regular diet Wound Care: keep wound clean and dry  Follow-up with [facility] in 2 weeks.  Signed: Emilie Rutter, PA-C 09/04/2023 4:50 PM [facility] Office: [phone]

## 2023-09-04 NOTE — Discharge Instructions (Signed)
 [facility]  Discharge instructions  Lower Extremity Bypass Surgery  Please refer to the following instruction for your post-procedure care. Your surgeon or physician assistant will discuss any changes with you.  Activity  You are encouraged to walk as much as you can. You can slowly return to normal activities during the month after your surgery. Avoid strenuous activity and heavy lifting until your doctor tells you it's OK. Avoid activities such as vacuuming or swinging a golf club. Do not drive until your doctor give the OK and you are no longer taking prescription pain medications. It is also normal to have difficulty with sleep habits, eating and bowel movement after surgery. These will go away with time.  Bathing/Showering  You may shower after you go home. Do not soak in a bathtub, hot tub, or swim until the incision heals completely.  Incision Care  Clean your incision with mild soap and water. Shower every day. Pat the area dry with a clean towel. You do not need a bandage unless otherwise instructed. Do not apply any ointments or creams to your incision. If you have open wounds you will be instructed how to care for them or a visiting nurse may be arranged for you. If you have staples or sutures along your incision they will be removed at your post-op appointment. You may have skin glue on your incision. Do not peel it off. It will come off on its own in about one week. If you have a great deal of moisture in your groin, use a gauze help keep this area dry.  Diet  Resume your normal diet. There are no special food restrictions following this procedure. A low fat/ low cholesterol diet is recommended for all patients with vascular disease. In order to heal from your surgery, it is CRITICAL to get adequate nutrition. Your body requires vitamins, minerals, and protein. Vegetables are the best source of vitamins and minerals. Vegetables also provide the  perfect balance of protein. Processed food has little nutritional value, so try to avoid this.  Medications  Resume taking all your medications unless your doctor or nurse practitioner tells you not to. If your incision is causing pain, you may take over-the-counter pain relievers such as acetaminophen (Tylenol). If you were prescribed a stronger pain medication, please aware these medication can cause nausea and constipation. Prevent nausea by taking the medication with a snack or meal. Avoid constipation by drinking plenty of fluids and eating foods with high amount of fiber, such as fruits, vegetables, and grains. Take Colase 100 mg (an over-the-counter stool softener) twice a day as needed for constipation. Do not take Tylenol if you are taking prescription pain medications.  Follow Up  Our office will schedule a follow up appointment 2-3 weeks following discharge.  Please call us immediately for any of the following conditions  Severe or worsening pain in your legs or feet while at rest or while walking Increase pain, redness, warmth, or drainage (pus) from your incision site(s) Fever of 101 degree or higher The swelling in your leg with the bypass suddenly worsens and becomes more painful than when you were in the hospital If you have been instructed to feel your graft pulse then you should do so every day. If you can no longer feel this pulse, call the office immediately. Not all patients are given this instruction.  Leg swelling is common after leg bypass surgery.  The swelling should improve over a few months  following surgery. To improve the swelling, you may elevate your legs above the level of your heart while you are sitting or resting. Your surgeon or physician assistant may ask you to apply an ACE wrap or wear compression (TED) stockings to help to reduce swelling.  Reduce your risk of vascular disease  Stop smoking. If you would like help call [facility] at 1-800-QUIT-NOW  ((470) 654-9347) or [place] at (807)887-0074.  Manage your cholesterol Maintain a desired weight Control your diabetes weight Control your diabetes Keep your blood pressure down  If you have any questions, please call the office at [phone]

## 2023-09-04 NOTE — Progress Notes (Signed)
 Reviewed AVS, patient expressed understanding of medications, MD follow up reviewed.   IV removed by RN; not present at assessment Patient states all belongings brought to the hospital at time of admission are accounted for and packed to take home.  Picked up medications from [facility] pharmacy. Pt transported to Discharge lounge to wait for transportation home.

## 2023-09-04 NOTE — Care Management Important Message (Signed)
 Important Message  Patient Details  Name: Bradley Ferguson MRN: [id number] Date of Birth: 07-19-1950   Important Message Given:  Yes - Medicare IM     Renie Ora 09/04/2023, 11:05 AM

## 2023-09-04 NOTE — TOC Initial Note (Addendum)
 Transition of Care [facility]) - Initial/Assessment Note    Patient Details  Name: Bradley Ferguson MRN: [id number] Date of Birth: 1950/09/07  [facility] CM/SW Contact:    Elliot Cousin, RN Phone Number:[phone] 09/04/2023, 9:40 AM  Clinical Narrative:                  TOC CM spoke to pt and states he lives independently at home. Drives to appts. Has friends that can assist him at home. He believes he can get a rolling walker, he is requesting [employer] for home. Contacted Emily Filbert for [employer] for home. Pt states friend will provide transportation home.   [facility] following for HHPT, PT did not recommend HH. Updated [facility] rep, Morrie Sheldon.   States he needs a new PCP. Appt arranged with [place] on 4/16 at 10 am with Dr Janee Morn.     Expected Discharge Plan: Home/Self Care Barriers to Discharge: No Barriers Identified   Patient Goals and CMS Choice Patient states their goals for this hospitalization and ongoing recovery are:: wants to remain independent          Expected Discharge Plan and Services   Discharge Planning Services: CM Consult     Expected Discharge Date: 09/04/23               DME Arranged: Dan Humphreys rolling with seat DME Agency: [facility]                  Prior Living Arrangements/Services   Lives with:: Roommate Patient language and need for interpreter reviewed:: Yes        Need for Family Participation in Patient Care: No (Comment) Care giver support system in place?: No (comment)   Criminal Activity/Legal Involvement Pertinent to Current Situation/Hospitalization: No - Comment as needed  Activities of Daily Living   ADL Screening (condition at time of admission) Independently performs ADLs?: Yes (appropriate for developmental age) Is the patient deaf or have difficulty hearing?: No Does the patient have difficulty seeing, even when wearing glasses/contacts?: No Does the patient have difficulty  concentrating, remembering, or making decisions?: No  Permission Sought/Granted Permission sought to share information with : Case Manager, Family Supports, PCP Permission granted to share information with : Yes, Verbal Permission Granted  Share Information with NAME: Cletis Athens     Permission granted to share info w Relationship: friend  Permission granted to share info w Contact Information: [phone]  Emotional Assessment       Orientation: : Oriented to Self, Oriented to Place, Oriented to  Time, Oriented to Situation   Psych Involvement: No (comment)  Admission diagnosis:  Aortoiliac occlusive disease (HCC) [I74.09] Patient Active Problem List   Diagnosis Date Noted   Aortoiliac occlusive disease (HCC) 09/02/2023   Critical limb ischemia of left lower extremity (HCC) 07/28/2023   Elevated coronary artery calcium score 07/13/2023   Hepatic steatosis 07/05/2020   Corn of foot 06/12/2020   Elevated LFTs 06/12/2020   Essential hypertension 04/29/2020   Right inguinal hernia 03/19/2020   TB lung, latent 12/19/2019   Systemic scleroses (HCC) 12/19/2019   Centrilobular emphysema (HCC) 11/24/2019   Interstitial pulmonary disease (HCC) 11/24/2019   Positive ANA (antinuclear antibody) 11/24/2019   Atherosclerosis of aorta (HCC) by Chest CT scan on 01/19/2020 06/30/2019   Chronic obstructive pulmonary disease (HCC) 06/30/2019   Acquired thrombophilia (HCC) 05/10/2019   Colon polyps 12/06/2018   Visit for monitoring Tikosyn therapy 04/27/2018   Claudication in peripheral vascular  disease (HCC) 04/16/2017   Labile hypertension 02/29/2016   BPH 02/29/2016   Hyperlipidemia, mixed 11/12/2015   Vitamin D deficiency 11/12/2015   Bipolar I disorder (HCC) 08/13/2015   Atrial fibrillation (HCC) 08/13/2015   Attention deficit disorder 08/08/2010   Dental caries 08/08/2010   ANXIETY DEPRESSION 06/06/2010   TOBACCO ABUSE 06/06/2010   RAYNAUDS SYNDROME 06/06/2010   PCP:   Patient, No Pcp Per Pharmacy:   [facility] [employer], [place] - [address] [place] [address] [place] [place] [address] Phone: [phone] Fax: [phone]  [id number] - [place], [place] - [address] [place] [address] Phone: [phone] Fax: [phone]  [facility] [address] [place] [address] Phone: [phone] Fax: [phone]     Social Drivers of Health (SDOH) Social History: SDOH Screenings   Food Insecurity: No Food Insecurity (09/02/2023)  Housing: Low Risk  (09/02/2023)  Transportation Needs: No Transportation Needs (09/02/2023)  Utilities: Not At Risk (09/02/2023)  Depression (PHQ2-9): Low Risk  (12/27/2022)  Social Connections: Socially Isolated (09/02/2023)  Tobacco Use: High Risk (09/02/2023)   SDOH Interventions:     Readmission Risk Interventions     No data to display

## 2023-09-04 NOTE — Progress Notes (Addendum)
  Progress Note    09/04/2023 7:43 AM 2 Days Post-Op  Subjective:  no complaints   Vitals:   09/04/23 0044 09/04/23 0348  BP:  118/61  Pulse: 76   Resp:  19  Temp: 98.9 F (37.2 C) 98.2 F (36.8 C)  SpO2: 97% 95%   Physical Exam: Lungs:  non labored Incisions:  L groin with some fullness but soft Extremities:  brisk L PT and DP by doppler Neurologic: A&O  CBC    Component Value Date/Time   WBC 6.9 09/03/2023 0420   RBC 2.96 (L) 09/03/2023 0420   HGB 9.0 (L) 09/03/2023 0420   HGB 14.4 06/16/2023 0927   HCT 26.9 (L) 09/03/2023 0420   HCT 44.2 06/16/2023 0927   PLT 90 (L) 09/03/2023 0420   PLT 180 06/16/2023 0927   MCV 90.9 09/03/2023 0420   MCV 93 06/16/2023 0927   MCH 30.4 09/03/2023 0420   MCHC 33.5 09/03/2023 0420   RDW 14.5 09/03/2023 0420   RDW 13.4 06/16/2023 0927   LYMPHSABS 2.2 06/16/2023 0927   MONOABS 630 02/05/2017 1234   EOSABS 260 07/06/2023 1433   EOSABS 0.4 06/16/2023 0927   BASOSABS 62 07/06/2023 1433   BASOSABS 0.1 06/16/2023 0927    BMET    Component Value Date/Time   NA 135 09/03/2023 0420   NA 141 06/16/2023 0927   K 4.0 09/03/2023 0420   CL 108 09/03/2023 0420   CO2 24 09/03/2023 0420   GLUCOSE 119 (H) 09/03/2023 0420   BUN 12 09/03/2023 0420   BUN 14 06/16/2023 0927   CREATININE 0.87 09/03/2023 0420   CREATININE 0.97 07/06/2023 1433   CALCIUM 7.7 (L) 09/03/2023 0420   GFRNONAA >60 09/03/2023 0420   GFRNONAA 70 11/12/2020 0000   GFRAA 82 11/12/2020 0000    INR    Component Value Date/Time   INR 1.6 (H) 08/18/2023 1332   INR 3.2 06/06/2010 1032     Intake/Output Summary (Last 24 hours) at 09/04/2023 0743 Last data filed at 09/04/2023 0700 Gross per 24 hour  Intake 480 ml  Output 850 ml  Net -370 ml     Assessment/Plan:  73 y.o. male is s/p  L CFA endarterectomy with bovine vein patch and iliac stenting  2 Days Post-Op   LLE well perfused with brisk DP and PT signals by doppler L groin incision with some fullness  but soft PT recommending rolling walker and this was ordered Plavix started for iliac stenting; will restart Xarelto at discharge Office will arrange wound check in 2 weeks    Emilie Rutter, PA-C [facility] [phone] 09/04/2023 7:43 AM  I have seen and evaluated the patient. I agree with the PA note as documented above.  Postop day 2 status post left common femoral endarterectomy with profundoplasty and retrograde left iliac stenting for disabling claudication.  Left groin with some fullness but this is soft.  He has brisk left AT DP signal.  Has walked in the hall.  Plan discharge today.  Discussed he can restart his Xarelto tomorrow.  Plavix for 1 month and then transition to aspirin and may stop the Plavix at that time as we discussed.  Will arrange follow-up in 2 to 3 weeks for incision checks.  Discussed he call with questions or concerns.  [name], MD [facility] [place] Office: [phone]

## 2023-09-22 ENCOUNTER — Ambulatory Visit (INDEPENDENT_AMBULATORY_CARE_PROVIDER_SITE_OTHER): Admitting: Physician Assistant

## 2023-09-22 ENCOUNTER — Other Ambulatory Visit: Payer: Self-pay | Admitting: *Deleted

## 2023-09-22 VITALS — BP 112/60 | HR 88 | Temp 98.0°F | Resp 18 | Ht 73.0 in | Wt 169.1 lb

## 2023-09-22 DIAGNOSIS — I70222 Atherosclerosis of native arteries of extremities with rest pain, left leg: Secondary | ICD-10-CM

## 2023-09-22 NOTE — Progress Notes (Unsigned)
 POST OPERATIVE OFFICE NOTE    CC:  F/u for surgery  HPI:  Bradley Ferguson is a 73 y.o. male who is here for postop check.  He recently underwent left common femoral endarterectomy with bovine pericardial patch angioplasty and angioplasty and stenting of the left common and external iliac arteries on 09/02/2023 by Dr. Chestine Spore.  This was done for disabling left lower extremity claudication.  Pt returns today for follow up.  Pt states he has been doing well since surgery.  He states his claudication has greatly improved.  He denies any issues with his incisions such as dehiscence, erythema, or drainage.  He has been keeping his incision clean and dry.  He denies any fevers or chills.  He denies any rest pain or tissue loss in the left foot.  Postoperative medication plan was for Plavix and Xarelto for 1 month.  After 1 month he can transition to aspirin and Xarelto only.  He can walk further with his left leg now.  Now his right leg is the one that limits him.  Incision healing appropriately.  Will bring him back in 3 to 4 weeks with left aortoiliac duplex and ABIs.  At that time we can discuss intervention on the right   Allergies  Allergen Reactions   Cymbalta [Duloxetine Hcl]    Effexor [Venlafaxine]     Feels bad   Wellbutrin [Bupropion]     sleepy   Zoloft [Sertraline Hcl]     Felt bad    Current Outpatient Medications  Medication Sig Dispense Refill   albuterol (VENTOLIN HFA) 108 (90 Base) MCG/ACT inhaler Use  2 inhalations  15 minutes apart  every 4 hours  to rescue Asthma Attack 48 g 3   atorvastatin (LIPITOR) 80 MG tablet Take 1 tablet Daily for Cholesterol 90 tablet 0   buPROPion (WELLBUTRIN SR) 150 MG 12 hr tablet TAKE 1 TABLET BY MOUTH DAILY FOR 3 DAYS THEN INCREASE TO 1 TABLET BY MOUTH TWICE A DAY. 180 tablet 1   Cholecalciferol 250 MCG (10000 UT) CAPS Take 10,000 Units by mouth daily.      clopidogrel (PLAVIX) 75 MG tablet Take 1 tablet (75 mg total) by mouth daily at 6 (six)  AM. 30 tablet 1   dofetilide (TIKOSYN) 500 MCG capsule TAKE 1 CAPSULE BY MOUTH 2 TIMES DAILY. 180 capsule 3   gabapentin (NEURONTIN) 300 MG capsule Take 1 capsule 3 x /day for Neuropathy Pain 270 capsule 3   ketoconazole (NIZORAL) 2 % cream Apply 1 Application topically daily as needed (Fungus).     oxyCODONE-acetaminophen (PERCOCET/ROXICET) 5-325 MG tablet Take 1 tablet by mouth every 4 (four) hours as needed for moderate pain (pain score 4-6). 30 tablet 0   pyridOXINE (VITAMIN B-6) 100 MG tablet Take 100 mg by mouth daily.     rivaroxaban (XARELTO) 20 MG TABS tablet Take 1 tablet by mouth daily to Prevent Blood Clots 90 tablet 3   triamcinolone cream (KENALOG) 0.5 % Apply 1 Application topically daily as needed (skin irritation).     No current facility-administered medications for this visit.     ROS:  See HPI  Physical Exam:   Incision: Left groin incision nearly healed without signs of infection or dehiscence.  No erythema or drainage Extremities: Brisk left DP/PT Doppler signals Neuro: Intact motor and sensation of left lower extremity    Assessment/Plan:  This is a 73 y.o. male who is s/p: ***  -***   Loel Dubonnet, PA-C [facility] [phone]   Clinic MD:  ***

## 2023-09-25 ENCOUNTER — Other Ambulatory Visit: Payer: Self-pay | Admitting: *Deleted

## 2023-09-25 DIAGNOSIS — I70222 Atherosclerosis of native arteries of extremities with rest pain, left leg: Secondary | ICD-10-CM

## 2023-09-30 ENCOUNTER — Encounter: Payer: Self-pay | Admitting: Internal Medicine

## 2023-10-05 ENCOUNTER — Ambulatory Visit: Payer: PPO | Attending: Cardiovascular Disease | Admitting: Cardiovascular Disease

## 2023-10-05 ENCOUNTER — Ambulatory Visit: Payer: PPO | Admitting: Nurse Practitioner

## 2023-10-05 ENCOUNTER — Encounter: Payer: Self-pay | Admitting: Cardiovascular Disease

## 2023-10-05 VITALS — BP 150/70 | HR 87 | Ht 73.0 in | Wt 170.2 lb

## 2023-10-05 DIAGNOSIS — R0989 Other specified symptoms and signs involving the circulatory and respiratory systems: Secondary | ICD-10-CM

## 2023-10-05 DIAGNOSIS — R931 Abnormal findings on diagnostic imaging of heart and coronary circulation: Secondary | ICD-10-CM | POA: Diagnosis not present

## 2023-10-05 DIAGNOSIS — F17200 Nicotine dependence, unspecified, uncomplicated: Secondary | ICD-10-CM | POA: Diagnosis not present

## 2023-10-05 DIAGNOSIS — E782 Mixed hyperlipidemia: Secondary | ICD-10-CM | POA: Diagnosis not present

## 2023-10-05 DIAGNOSIS — I739 Peripheral vascular disease, unspecified: Secondary | ICD-10-CM

## 2023-10-05 DIAGNOSIS — I480 Paroxysmal atrial fibrillation: Secondary | ICD-10-CM

## 2023-10-05 MED ORDER — CLOPIDOGREL BISULFATE 75 MG PO TABS: 75.0000 mg | ORAL_TABLET | Freq: Every day | ORAL | 1 refills | Status: DC

## 2023-10-05 NOTE — Assessment & Plan Note (Signed)
 History of hyperlipidemia on high-dose statin therapy with lipid profile performed 07/06/2023 revealing total cholesterol 160, LDL 90 and HDL 45.

## 2023-10-05 NOTE — Progress Notes (Signed)
 10/05/2023 Bradley Ferguson   February 27, 1951  [id number]  Primary Physician Patient, No Pcp Per Primary Cardiologist: Runell Gess MD Nicholes Calamity, [place]  HPI:  Bradley Ferguson is a 73 y.o. male separated, father of 3, grandfather one grandchild who works as a Multimedia programmer. He was referred by Dr. Oneta Rack for peripheral vascular evaluation because of life limiting claudication. I last saw him in the office 06/16/2023.Marland Kitchen His risk factors include treated hyperlipidemia and 45 pack years of tobacco abuse continued to smoke one pack per day. Never had a heart attack or stroke. He does complain of some dyspnea on exertion probably related to COPD. He's had A. fib ablation back in 2011 by Dr. Graciela Husbands but has not followed up since. He has complained of less limiting claudication over the last year which is symmetric and bilateral. He can walk one block after which she has to stop because of discomfort. He has had lower extremity arterial Doppler studies in our office 04/11/17 revealing a high-frequency signal in his right common iliac artery and a total left SFA.   I performed peripheral angiography on him 04/27/17 revealing a 99% calcified eccentric proximal right common iliac artery stenosis and bilateral long segment SFA chronic total occlusions which were highly calcified. I performed diamondback orbital rotational atherectomy, PTCA and cover stenting using a 7 mm x 39 mm long VBX covered stent with an excellent angiographic result. The claudication had somewhat improved as has his Doppler studies. I do not think his SFAs are easily percutaneously addressable.   He continues to complain of some continued claudication.  He continues to smoke as well.  He also complains of some shortness of breath.    He had Dopplers performed 12/21/2019 revealing ABIs of 0.7 range bilaterally with a patent right iliac stent.  I believe he would benefit from the addition of Pletal.  We will explore this with our Pharm.D. to  determine if there are any drug drug interactions.  He is also not at goal on high-dose atorvastatin and may benefit from being on a PCSK9 inhibitor as well.     He had a abdominal pelvic CTA that showed a patent iliac stent, confirmed SFA occlusion with potential progression of disease in his common femoral arteries bilaterally.  He has had progressive claudication with a decline in his left ABI from 0.54-0.39.  The Dopplers also suggest occlusion of the left iliac artery and on exam he does not have a left femoral pulse.  Does have a faint right femoral pulse without bruit.  Given his symptoms I favor proceeding with peripheral angiography to assess his anatomy and make further assessment as to his revascularization options.  I performed peripheral angiography on him 07/09/2023 revealing a patent right common iliac artery stent, 90% calcified left common iliac artery stenosis, 95% bilateral calcified common femoral stenoses with occluded SFAs bilaterally which are chronic.  I referred him to Dr. Chestine Spore for surgical evaluation.  He did have a coronary calcium score performed 07/07/2023 which was 1483 and a Myoview stress test performed 07/20/2023 which is low risk and nonischemic.  Dr. Chestine Spore performed left common iliac artery PTA and covered stenting using a 9 mm x 39 mm long VBX stent and 9 mm x 29 mm long VBX stent.  He then performed a left common femoral endarterectomy and patch angioplasty along with profundoplasty using bovine pericardial patch.  His left lower extremity claudication has significantly improved and now he complains of right  lower extremity claudication.   Current Meds  Medication Sig   albuterol (VENTOLIN HFA) 108 (90 Base) MCG/ACT inhaler Use  2 inhalations  15 minutes apart  every 4 hours  to rescue Asthma Attack   atorvastatin (LIPITOR) 80 MG tablet Take 1 tablet Daily for Cholesterol   Cholecalciferol 250 MCG (10000 UT) CAPS Take 10,000 Units by mouth daily.    dofetilide (TIKOSYN)  500 MCG capsule TAKE 1 CAPSULE BY MOUTH 2 TIMES DAILY.   gabapentin (NEURONTIN) 300 MG capsule Take 1 capsule 3 x /day for Neuropathy Pain   ketoconazole (NIZORAL) 2 % cream Apply 1 Application topically daily as needed (Fungus).   oxyCODONE-acetaminophen (PERCOCET/ROXICET) 5-325 MG tablet Take 1 tablet by mouth every 4 (four) hours as needed for moderate pain (pain score 4-6).   pyridOXINE (VITAMIN B-6) 100 MG tablet Take 100 mg by mouth daily.   rivaroxaban (XARELTO) 20 MG TABS tablet Take 1 tablet by mouth daily to Prevent Blood Clots   triamcinolone cream (KENALOG) 0.5 % Apply 1 Application topically daily as needed (skin irritation).   [DISCONTINUED] clopidogrel (PLAVIX) 75 MG tablet Take 1 tablet (75 mg total) by mouth daily at 6 (six) AM.     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  He is down to 2 cigarettes a day.  Vaping Use   Vaping status: Never Used  Substance and Sexual Activity   Alcohol use: Yes    Alcohol/week: 2.0 standard drinks of alcohol    Types: 2 Standard drinks or equivalent per week    Comment: 2 drinks per week    Drug use: Never   Sexual activity: Not on file  Other Topics Concern   Not on file  Social History Narrative   Not on file   Social Drivers of Health   Financial Resource Strain: Not on file   Food Insecurity: No Food Insecurity (09/02/2023)   Hunger Vital Sign    Worried About Running Out of Food in the Last Year: Never true    Ran Out of Food in the Last Year: Never true  Transportation Needs: No Transportation Needs (09/02/2023)   PRAPARE - Administrator, Civil Service (Medical): No    Lack of Transportation (Non-Medical): No  Physical Activity: Not on file  Stress: Not on file  Social Connections: Socially Isolated (09/02/2023)   Social Connection and Isolation Panel [NHANES]    Frequency of Communication with Friends and Family: Twice a week    Frequency of Social Gatherings with Friends and Family: Once a week    Attends Religious Services: Never    Database administrator or Organizations: No  Attends Banker Meetings: Never    Marital Status: Widowed  Intimate Partner Violence: Not At Risk (09/02/2023)   Humiliation, Afraid, Rape, and Kick questionnaire    Fear of Current or Ex-Partner: No    Emotionally Abused: No    Physically Abused: No    Sexually Abused: No     Review of Systems: General: negative for chills, fever, night sweats or weight changes.  Cardiovascular: negative for chest pain, dyspnea on exertion, edema, orthopnea, palpitations, paroxysmal nocturnal dyspnea or shortness of breath Dermatological: negative for rash Respiratory: negative for cough or wheezing Urologic: negative for hematuria Abdominal: negative for nausea, vomiting, diarrhea, bright red blood per rectum, melena, or hematemesis Neurologic: negative for visual changes, syncope, or dizziness All other systems reviewed and are otherwise negative except as noted above.    Blood pressure (!) 150/70, pulse 87, height 6\' 1"  (1.854 m), weight 170 lb 3.2 oz (77.2 kg), SpO2 100%.  General appearance: alert and no distress Neck: no adenopathy, no carotid bruit, no JVD, supple, symmetrical, trachea midline, and thyroid not enlarged, symmetric, no  tenderness/mass/nodules Lungs: clear to auscultation bilaterally Heart: regular rate and rhythm, S1, S2 normal, no murmur, click, rub or gallop Extremities: extremities normal, atraumatic, no cyanosis or edema Pulses: Absent pedal pulses Skin: Skin color, texture, turgor normal. No rashes or lesions Neurologic: Grossly normal  EKG EKG Interpretation Date/Time:  Monday October 05 2023 11:26:11 EDT Ventricular Rate:  87 PR Interval:  180 QRS Duration:  126 QT Interval:  396 QTC Calculation: 476 R Axis:   101  Text Interpretation: Sinus rhythm with Premature atrial complexes Right bundle branch block When compared with ECG of 09-Feb-2023 11:24, Premature atrial complexes are now Present Confirmed by Nanetta Batty [id number]) on 10/05/2023 11:49:08 AM    ASSESSMENT AND PLAN:   TOBACCO ABUSE Significant decrease in smoking now 1 to 2 cigarettes a day.  Atrial fibrillation (HCC) History of atrial fibrillation in the past status post A-fib ablation by Dr. Graciela Husbands in 2011.  He remains on Tikosyn and Xarelto.  Hyperlipidemia, mixed History of hyperlipidemia on high-dose statin therapy with lipid profile performed 07/06/2023 revealing total cholesterol 160, LDL 90 and HDL 45.  Labile hypertension History of labile hypertension with blood pressure measured today at 150/70.  He is not on any antihypertensive medication.  Peripheral arterial disease (HCC) History of PAD status post peripheral angiography which I performed 04/27/2017 revealing 99% calcified eccentric proximal right common iliac artery stenosis with bilateral long segment SFA CTO's which are highly calcified.  I performed Dynabac orbital rotational atherectomy, PTA and covered stenting using a 7 mm x 39 mm long VBX covered stent with an excellent result.  His claudication improved as did his Doppler studies.  I do not think his SFAs were percutaneously addressable.  Because of recurrent symptoms and decline in his ABIs I reangiogram to him  07/09/2023 revealing a patent right common iliac artery stent, 90% calcified left common iliac artery stenosis, 95% calcified bilateral common femoral stenoses with occluded SFAs.  I did refer him to Dr. Chestine Spore for surgical intervention which was performed 09/02/2023.  He had VBX stenting of his left common iliac artery stenosis and left common femoral endarterectomy with patch angioplasty including profundoplasty.  His left lower extremity claudication has significantly improved and now his right lower extremity is more symptomatic.  He would be a candidate for right common femoral endarterectomy and patch angioplasty.  Elevated coronary artery calcium score Elevated coronary calcium score performed 07/15/2023  which was 1483.  Based on this he had a Myoview stress test which was low risk and nonischemic.  He denies chest pain or shortness of breath.     Runell Gess MD FACP,FACC,FAHA, [facility] 10/05/2023 12:07 PM

## 2023-10-05 NOTE — Assessment & Plan Note (Signed)
 Significant decrease in smoking now 1 to 2 cigarettes a day.

## 2023-10-05 NOTE — Assessment & Plan Note (Signed)
 History of labile hypertension with blood pressure measured today at 150/70.  He is not on any antihypertensive medication.

## 2023-10-05 NOTE — Assessment & Plan Note (Signed)
 History of PAD status post peripheral angiography which I performed 04/27/2017 revealing 99% calcified eccentric proximal right common iliac artery stenosis with bilateral long segment SFA CTO's which are highly calcified.  I performed Dynabac orbital rotational atherectomy, PTA and covered stenting using a 7 mm x 39 mm long VBX covered stent with an excellent result.  His claudication improved as did his Doppler studies.  I do not think his SFAs were percutaneously addressable.  Because of recurrent symptoms and decline in his ABIs I reangiogram to him 07/09/2023 revealing a patent right common iliac artery stent, 90% calcified left common iliac artery stenosis, 95% calcified bilateral common femoral stenoses with occluded SFAs.  I did refer him to Dr. Chestine Spore for surgical intervention which was performed 09/02/2023.  He had VBX stenting of his left common iliac artery stenosis and left common femoral endarterectomy with patch angioplasty including profundoplasty.  His left lower extremity claudication has significantly improved and now his right lower extremity is more symptomatic.  He would be a candidate for right common femoral endarterectomy and patch angioplasty.

## 2023-10-05 NOTE — Assessment & Plan Note (Signed)
 History of atrial fibrillation in the past status post A-fib ablation by Dr. Graciela Husbands in 2011.  He remains on Tikosyn and Xarelto.

## 2023-10-05 NOTE — Assessment & Plan Note (Signed)
 Elevated coronary calcium score performed 07/15/2023 which was 1483.  Based on this he had a Myoview stress test which was low risk and nonischemic.  He denies chest pain or shortness of breath.

## 2023-10-05 NOTE — Patient Instructions (Signed)
 Medication Instructions:  Your physician recommends that you continue on your current medications as directed. Please refer to the Current Medication list given to you today.  *If you need a refill on your cardiac medications before your next appointment, please call your pharmacy*  Follow-Up: At [facility], you and your health needs are our priority.  As part of our continuing mission to provide you with exceptional heart care, our providers are all part of one team.  This team includes your primary Cardiologist (physician) and Advanced Practice Providers or APPs (Physician Assistants and Nurse Practitioners) who all work together to provide you with the care you need, when you need it.  Your next appointment:   6 month(s)  Provider:   Nanetta Batty, MD     We recommend signing up for the patient portal called "MyChart".  Sign up information is provided on this After Visit Summary.  MyChart is used to connect with patients for Virtual Visits (Telemedicine).  Patients are able to view lab/test results, encounter notes, upcoming appointments, etc.  Non-urgent messages can be sent to your provider as well.   To learn more about what you can do with MyChart, go to [email or web address].   Other Instructions       1st Floor: - Lobby - Registration  - Pharmacy  - Lab - Cafe  2nd Floor: - PV Lab - Diagnostic Testing (echo, CT, nuclear med)  3rd Floor: - Vacant  4th Floor: - TCTS (cardiothoracic surgery) - AFib Clinic - Structural Heart Clinic - Vascular Surgery  - Vascular Ultrasound  5th Floor: - HeartCare Cardiology (general and EP) - Clinical Pharmacy for coumadin, hypertension, lipid, weight-loss medications, and med management appointments    Valet parking services will be available as well.

## 2023-10-13 ENCOUNTER — Ambulatory Visit (HOSPITAL_COMMUNITY)
Admission: RE | Admit: 2023-10-13 | Discharge: 2023-10-13 | Disposition: A | Discharge status: Home / Self Care | Source: Ambulatory Visit | Attending: Cardiology | Admitting: Cardiology

## 2023-10-13 ENCOUNTER — Ambulatory Visit (HOSPITAL_COMMUNITY)
Admission: RE | Admit: 2023-10-13 | Discharge: 2023-10-13 | Disposition: A | Discharge status: Home / Self Care | Source: Ambulatory Visit | Attending: Cardiology

## 2023-10-13 ENCOUNTER — Ambulatory Visit (HOSPITAL_COMMUNITY)

## 2023-10-13 DIAGNOSIS — I70222 Atherosclerosis of native arteries of extremities with rest pain, left leg: Secondary | ICD-10-CM | POA: Insufficient documentation

## 2023-10-13 LAB — VAS US ABI WITH/WO TBI
Left ABI: 0.64
Right ABI: 0.28

## 2023-10-14 ENCOUNTER — Ambulatory Visit (INDEPENDENT_AMBULATORY_CARE_PROVIDER_SITE_OTHER): Admitting: Family Medicine

## 2023-10-14 VITALS — BP 126/84 | HR 89 | Temp 97.5°F | Wt 170.0 lb

## 2023-10-14 DIAGNOSIS — J410 Simple chronic bronchitis: Secondary | ICD-10-CM | POA: Diagnosis not present

## 2023-10-14 DIAGNOSIS — E782 Mixed hyperlipidemia: Secondary | ICD-10-CM | POA: Diagnosis not present

## 2023-10-14 DIAGNOSIS — I480 Paroxysmal atrial fibrillation: Secondary | ICD-10-CM | POA: Diagnosis not present

## 2023-10-14 DIAGNOSIS — I739 Peripheral vascular disease, unspecified: Secondary | ICD-10-CM

## 2023-10-14 DIAGNOSIS — F17200 Nicotine dependence, unspecified, uncomplicated: Secondary | ICD-10-CM | POA: Diagnosis not present

## 2023-10-14 NOTE — Patient Instructions (Addendum)
 VISIT SUMMARY:  Today, you came in to establish care and discuss concerns about your leg circulation and overall health. We reviewed your recent medical history, including your surgery for a left femoral aneurysm and ongoing issues with your right leg. We also discussed your atrial fibrillation, COPD, and medication management.  YOUR PLAN:  -PERIPHERAL ARTERIAL DISEASE (PAD): Peripheral arterial disease is a condition where the blood vessels outside your heart and brain narrow, reducing blood flow to your limbs. You should continue taking rivaroxaban and clopidogrel as prescribed. We discussed the possibility of using cilostazol or Pentoxifylline for your leg pain, but you need to consult with your cardiologist or vascular surgeon due to potential side effects. You have a follow-up with your vascular surgeon on April 29th.  -ATRIAL FIBRILLATION: Atrial fibrillation is an irregular and often rapid heart rate that can increase your risk of strokes and other heart-related complications. You should continue taking Tikosyn and rivaroxaban as prescribed. Please consult with your cardiologist regarding your anticoagulation management.  -CHRONIC OBSTRUCTIVE PULMONARY DISEASE (COPD): COPD is a chronic inflammatory lung disease that obstructs airflow from the lungs. You have reduced your smoking significantly, which is excellent. Consider using your albuterol inhaler as needed for shortness of breath or cough. Keep working on quitting smoking completely.  -GENERAL HEALTH MAINTENANCE: We discussed the importance of staying up to date with your COVID-19 vaccinations. Annual COVID-19 vaccinations are recommended, with additional boosters advised for those over 65 or with certain risk factors. Consider getting a COVID-19 booster if it's been a while since your last vaccination.  INSTRUCTIONS:  Please follow up with your vascular surgeon on April 29th regarding your leg circulation issues. Also, consult with your  cardiologist about the use of cilostazol and your anticoagulation management. Schedule a follow-up appointment with our family practice in approximately three months or sooner if needed after addressing your leg issues.

## 2023-10-14 NOTE — Progress Notes (Signed)
 Assessment/Plan:   Assessment & Plan Peripheral Arterial Disease (PAD) Peripheral arterial disease with recent left femoral aneurysm surgery on March 5th. Post-surgery, improvement in the left leg noted, but significant claudication in the right leg persists, including pain and loss of balance after minimal walking. Currently on rivaroxaban and clopidogrel for anticoagulation and antiplatelet therapy, respectively. Concerns about the duration of clopidogrel therapy post-surgery due to conflicting advice from providers. Experiences easy bruising but no recent epistaxis. Cilostazol discussed as a potential treatment for claudication, advised to consult with cardiologist or vascular surgeon due to potential cardiac side effects, including effects on heart rhythm or heart failure. - Continue rivaroxaban and clopidogrel as prescribed. - Consult with cardiologist and vascular surgeon regarding the use of cilostazol for claudication. - Follow up with vascular surgeon on April 29th.  Atrial Fibrillation Atrial fibrillation managed with Tikosyn for rhythm control and rivaroxaban for anticoagulation. Cardiologist is Dr. Gery Pray. - Continue Tikosyn and rivaroxaban as prescribed. - Consult with cardiologist regarding anticoagulation management.  Chronic Obstructive Pulmonary Disease (COPD) Chronic obstructive pulmonary disease with reduced smoking to one or two cigarettes a day. Experiences exertional dyspnea, possibly related to both COPD and smoking history. Albuterol inhaler may help alleviate symptoms. - Consider using albuterol inhaler as needed for dyspnea or cough. - Encourage continued smoking cessation efforts.  General Health Maintenance Discussed COVID-19 vaccination. Annual COVID-19 vaccinations recommended, with additional boosters advised for those over 65 or with certain risk factors. - Consider COVID-19 booster if indicated based on risk factors and timing of last  vaccination.  Follow-up Under care of multiple specialists for his conditions. Advised to allow specialists to manage current treatment plan to avoid confusion. - Follow up with family practice in approximately three months or as needed after leg issues are addressed.      Medications Discontinued During This Encounter  Medication Reason   oxyCODONE-acetaminophen (PERCOCET/ROXICET) 5-325 MG tablet     Return in about 3 months (around 01/13/2024) for follow up.    Subjective:   Encounter date: 10/14/2023  Bradley Ferguson is a 73 y.o. male who has ANXIETY DEPRESSION; TOBACCO ABUSE; RAYNAUDS SYNDROME; Attention deficit disorder; Dental caries; Bipolar I disorder (HCC); Atrial fibrillation (HCC); Mixed hyperlipidemia; Vitamin D deficiency; Labile hypertension; BPH; Peripheral arterial disease (HCC); Visit for monitoring Tikosyn therapy; Colon polyps; Acquired thrombophilia (HCC); Atherosclerosis of aorta (HCC) by Chest CT scan on 01/19/2020; Chronic obstructive pulmonary disease (HCC); Centrilobular emphysema (HCC); Interstitial pulmonary disease (HCC); Positive ANA (antinuclear antibody); TB lung, latent; Systemic scleroses (HCC); Right inguinal hernia; Essential hypertension; Corn of foot; Elevated LFTs; Hepatic steatosis; Elevated coronary artery calcium score; Critical limb ischemia of left lower extremity (HCC); and Aortoiliac occlusive disease (HCC) on their problem list..   He  has a past medical history of Anxiety, Atrial fibrillation (HCC), Blood transfusion without reported diagnosis, Cataract, Depression, Emphysema of lung (HCC), Heart murmur, Hyperlipidemia, Hypertension, PVD (peripheral vascular disease) (HCC), Raynaud's syndrome, Substance abuse (HCC), Systemic sclerosis (HCC), TB lung, latent (12/19/2019), and Transaminitis (06/18/2020).Marland Kitchen   He presents with chief complaint of Establish Care (Dr. Oneta Rack pt. Circulation and numbness in feet. See's cardiology.Had stints in upper left  thigh to help with circulations) .   Discussed the use of AI scribe software for clinical note transcription with the patient, who gave verbal consent to proceed.  History of Present Illness Bradley Ferguson is a 73 year old male with peripheral vascular disease and atrial fibrillation who presents for establishing care and concerns about leg circulation.  He is here to establish care after his long-term doctor retired. He has had multiple doctor's appointments in the past few months, including surgery for a left femoral aneurysm on September 02, 2023. He has concerns about his heart and leg circulation, mentioning the possibility of needing further surgery or stents for his peripheral vascular disease.  The surgery has helped somewhat with his left leg, but he still experiences significant issues with his right leg. The right leg becomes 'dead' and painful after a few minutes of walking, with pain primarily in the calf and a loss of balance. The right leg is now worse than the left.  He is currently on rivaroxaban for atrial fibrillation and clopidogrel, which was started after his surgery for peripheral arterial disease. He also takes Tikosyn to maintain heart rhythm. He has a history of epistaxis with Xarelto but has not experienced them recently. He is unsure about the duration of clopidogrel use, having received different advice from his doctors.  He has a history of COPD and has been prescribed an albuterol inhaler, which he does not use regularly. He has significantly reduced his smoking to one or two cigarettes a day, acknowledging the difficulty in quitting. He experiences shortness of breath and coughs, particularly when eating quickly, which he attributes to swallowing difficulties.  He takes gabapentin for leg pain, which he finds necessary to manage his symptoms. He also mentions a past diagnosis of Raynaud's syndrome and peripheral circulation issues, with symptoms like fingers turning gray in  cold weather.  He has been prescribed atorvastatin for cholesterol management and has used oxycodone post-surgery for pain, though it is not refilled regularly. He expresses concern about the interactions of his multiple medications.     ROS  Past Surgical History:  Procedure Laterality Date   ABDOMINAL AORTOGRAM W/LOWER EXTREMITY Left 07/09/2023   Procedure: ABDOMINAL AORTOGRAM W/LOWER EXTREMITY;  Surgeon: Runell Gess, MD;  Location: [facility];  Service: Cardiovascular;  Laterality: Left;   ATRIAL FIBRILLATION ABLATION     CATARACT EXTRACTION Right    COLONOSCOPY     ENDARTERECTOMY FEMORAL Left 09/02/2023   Procedure: LEFT LOWER EXTREMITY ANGIOGRAM WITH LEFT ILIAC  STENTING;  Surgeon: [name], MD;  Location: [facility] OR;  Service: Vascular;  Laterality: Left;   EYE SURGERY     FEMORAL-POPLITEAL BYPASS GRAFT Left 09/02/2023   Procedure: LEFT FEMORAL ENDARTARECTOMY WITH PATCH ANGIOPLASTY;  Surgeon: [name], MD;  Location: [facility] OR;  Service: Vascular;  Laterality: Left;   FRACTURE SURGERY Left 1976   HERNIA REPAIR  2023   INGUINAL HERNIA REPAIR Right 03/23/2020   Procedure: OPEN RIGHT INGUINAL HERNIA REPAIR WITH MESH;  Surgeon: Luretha Murphy, MD;  Location: [facility] OR;  Service: General;  Laterality: Right;   INSERTION OF MESH Right 03/23/2020   Procedure: INSERTION OF MESH;  Surgeon: Luretha Murphy, MD;  Location: [facility] OR;  Service: General;  Laterality: Right;   LOWER EXTREMITY INTERVENTION N/A 04/27/2017   Procedure: LOWER EXTREMITY INTERVENTION;  Surgeon: Runell Gess, MD;  Location: [facility];  Service: Cardiovascular;  Laterality: N/A;   PERIPHERAL VASCULAR ATHERECTOMY Right 04/27/2017   Procedure: PERIPHERAL VASCULAR ATHERECTOMY;  Surgeon: Runell Gess, MD;  Location: [facility] INVASIVE CV LAB;  Service: Cardiovascular;  Laterality: Right;  Iliac   PERIPHERAL VASCULAR INTERVENTION Right 04/27/2017   Procedure: PERIPHERAL VASCULAR  INTERVENTION;  Surgeon: Runell Gess, MD;  Location: [facility];  Service: Cardiovascular;  Laterality: Right;  Iliac  Outpatient Medications Prior to Visit  Medication Sig Dispense Refill   albuterol (VENTOLIN HFA) 108 (90 Base) MCG/ACT inhaler Use  2 inhalations  15 minutes apart  every 4 hours  to rescue Asthma Attack 48 g 3   atorvastatin (LIPITOR) 80 MG tablet Take 1 tablet Daily for Cholesterol 90 tablet 0   Cholecalciferol 250 MCG (10000 UT) CAPS Take 10,000 Units by mouth daily.      clopidogrel (PLAVIX) 75 MG tablet Take 1 tablet (75 mg total) by mouth daily at 6 (six) AM. 30 tablet 1   dofetilide (TIKOSYN) 500 MCG capsule TAKE 1 CAPSULE BY MOUTH 2 TIMES DAILY. 180 capsule 3   gabapentin (NEURONTIN) 300 MG capsule Take 1 capsule 3 x /day for Neuropathy Pain 270 capsule 3   ketoconazole (NIZORAL) 2 % cream Apply 1 Application topically daily as needed (Fungus).     pyridOXINE (VITAMIN B-6) 100 MG tablet Take 100 mg by mouth daily.     rivaroxaban (XARELTO) 20 MG TABS tablet Take 1 tablet by mouth daily to Prevent Blood Clots 90 tablet 3   triamcinolone cream (KENALOG) 0.5 % Apply 1 Application topically daily as needed (skin irritation).     oxyCODONE-acetaminophen (PERCOCET/ROXICET) 5-325 MG tablet Take 1 tablet by mouth every 4 (four) hours as needed for moderate pain (pain score 4-6). (Patient not taking: Reported on 10/14/2023) 30 tablet 0   No facility-administered medications prior to visit.    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  He is down to 2 cigarettes a day.  Vaping Use   Vaping status: Never Used  Substance and Sexual Activity   Alcohol use: Yes    Alcohol/week: 2.0 standard drinks of alcohol    Types: 2 Standard drinks or equivalent per week    Comment: Social   Drug use: Not Currently    Frequency: 1.0 times per week    Types: Marijuana   Sexual activity: Not Currently    Birth control/protection: None  Other Topics Concern   Not on file  Social History Narrative   Not on file   Social Drivers of Health   Financial Resource Strain: Medium Risk (10/14/2023)   Overall Financial Resource Strain (CARDIA)    Difficulty of Paying Living Expenses: Somewhat hard  Food Insecurity: No Food Insecurity (10/14/2023)   Hunger Vital Sign    Worried About Running Out of Food in the Last Year:  Never true    Ran Out of Food in the Last Year: Never true  Transportation Needs: No Transportation Needs (10/14/2023)   PRAPARE - Administrator, Civil Service (Medical): No    Lack of Transportation (Non-Medical): No  Physical Activity: Unknown (10/14/2023)   Exercise Vital Sign    Days of Exercise per Week: 0 days    Minutes of Exercise per Session: Not on file  Stress: No Stress Concern Present (10/14/2023)   [employer] of Occupational Health - Occupational Stress Questionnaire    Feeling of Stress : Only a little  Social Connections: Moderately Isolated (10/14/2023)   Social Connection and Isolation Panel [NHANES]    Frequency of Communication with Friends and Family: Three times a week    Frequency of Social  Gatherings with Friends and Family: Once a week    Attends Religious Services: 1 to 4 times per year    Active Member of [employer] or Organizations: No    Attends Banker Meetings: Never    Marital Status: Widowed  Intimate Partner Violence: Not At Risk (09/02/2023)   Humiliation, Afraid, Rape, and Kick questionnaire    Fear of Current or Ex-Partner: No    Emotionally Abused: No    Physically Abused: No    Sexually Abused: No                                                                                                  Objective:  Physical Exam: BP 126/84   Pulse 89   Temp (!) 97.5 F (36.4 C) (Temporal)   Wt 170 lb (77.1 kg)   SpO2 96%   BMI 22.43 kg/m    Physical Exam GENERAL: Alert, cooperative, well developed, no acute distress. HEENT: Normocephalic, normal oropharynx, moist mucous membranes. CHEST: Clear to auscultation bilaterally, no wheezes, rhonchi, or crackles. CARDIOVASCULAR: Normal heart rate and rhythm, S1 and S2 normal without murmurs. ABDOMEN: Soft, non-tender, non-distended, without organomegaly, normal bowel sounds. EXTREMITIES: No cyanosis or edema. NEUROLOGICAL: Cranial nerves grossly intact, moves all extremities without gross motor or sensory deficit.   Physical Exam Constitutional:      Appearance: Normal appearance.  HENT:     Head: Normocephalic and atraumatic.     Right Ear: Hearing normal.     Left Ear: Hearing normal.     Nose: Nose normal.  Eyes:     General: No scleral icterus.       Right eye: No discharge.        Left eye: No discharge.     Extraocular Movements: Extraocular movements intact.  Cardiovascular:     Rate and Rhythm: Normal rate and regular rhythm.     Heart sounds: Normal heart sounds.  Pulmonary:     Effort: Pulmonary effort is normal.     Breath sounds: Normal breath sounds.  Abdominal:     Palpations: Abdomen is soft.     Tenderness: There is no abdominal tenderness.  Skin:    General: Skin is warm.      Findings: No rash.  Neurological:     General:  No focal deficit present.     Mental Status: He is alert.     Cranial Nerves: No cranial nerve deficit.  Psychiatric:        Mood and Affect: Mood normal.        Behavior: Behavior normal.        Thought Content: Thought content normal.        Judgment: Judgment normal.     VAS [place] ABI WITH/WO TBI Result Date: 10/13/2023  LOWER EXTREMITY DOPPLER STUDY Patient Name:  Bradley Ferguson  Date of Exam:   10/13/2023 Medical Rec #: [id number]       Accession #:    [id number] Date of Birth: [birth year]       Patient Gender: M Patient Age:   67 years Exam Location:  [facility] Procedure:      VAS [place] ABI WITH/WO TBI Referring Phys: Loel Dubonnet PA-C --------------------------------------------------------------------------------  Indications: Peripheral artery disease. Patient reports his left lower extremity              symptoms have improved since his procedure in 08/2023. Now, he              reports his right leg has symptoms similar to how the left leg use              to be. He states the right gets numb after walking less than five              minutes, with symptoms usually occurring from the right calf down.              He also reports bilateral cold feet. High Risk Factors: Hypertension, hyperlipidemia, current smoker.  Vascular Interventions: Successful diamondback orbital rotation arthrectomy, PT                         and covered stent at a subtotal calcified proximal right                         common iliac artery on 04/27/2017. Angioplasty and stent                         of left common and external iliac artery, and left                         common femoral endarterectomy including profundoplasty                         and endarterectomy the proximal SFA with bovine                         pericardial patch angioplasty on 09/02/2023. Comparison Study: In 11/2021, a lower arterial Doppler showed an ABI of .55 on                   the right and  .39 on the left. Performing Technologist: Tyna Jaksch RVT  Examination Guidelines: A complete evaluation includes at minimum, Doppler waveform signals and systolic blood pressure reading at the level of bilateral brachial, anterior tibial, and posterior tibial arteries, when vessel segments are accessible. Bilateral testing is considered an integral part of a complete examination. Photoelectric Plethysmograph (PPG) waveforms and toe systolic pressure readings are included as required and additional duplex testing as needed. Limited examinations for reoccurring indications  may be performed as noted.  ABI Findings: +---------+------------------+-----+----------+--------+ Right    Rt Pressure (mmHg)IndexWaveform  Comment  +---------+------------------+-----+----------+--------+ Brachial 100                                       +---------+------------------+-----+----------+--------+ PTA      0                 0.00 absent             +---------+------------------+-----+----------+--------+ DP       34                0.28 monophasic         +---------+------------------+-----+----------+--------+ Great Toe0                 0.00 Absent             +---------+------------------+-----+----------+--------+ +---------+------------------+-----+----------+-------+ Left     Lt Pressure (mmHg)IndexWaveform  Comment +---------+------------------+-----+----------+-------+ Brachial 120                                      +---------+------------------+-----+----------+-------+ PTA      77                0.64 monophasic        +---------+------------------+-----+----------+-------+ DP       76                0.63 monophasic        +---------+------------------+-----+----------+-------+ Great Toe36                0.30 Abnormal          +---------+------------------+-----+----------+-------+ +-------+-----------+-----------+------------+------------+ ABI/TBIToday's  ABIToday's TBIPrevious ABIPrevious TBI +-------+-----------+-----------+------------+------------+ Right  .28        0.0        .55         0.0          +-------+-----------+-----------+------------+------------+ Left   .64        .30        .39         0.0          +-------+-----------+-----------+------------+------------+ Right ABIs appear decreased compared to prior study on 02/23/2023. Left ABIs and TBIs appear increased compared to prior study on 08/26/20224. Right TBIs appear essentially unchanged compared to prior study on 02/23/2023.  Summary: Right: Resting right ankle-brachial index indicates critical limb ischemia. The right toe-brachial index is abnormal. Right ABI .28 with severely abnormal monophasic waveform pattern at the ankle. Left: Resting left ankle-brachial index indicates moderate left lower extremity arterial disease. The left toe-brachial index is abnormal. Left ABI 0.64 with severely abnormal monophasic waveform pattern at the ankle. *See table(s) above for measurements and observations. See Aortoiliac duplex report. Suggest Peripheral Vascular Consult. Electronically signed by Delrae Rend on 10/13/2023 at 6:19:17 PM.    Final    VAS US AORTA/IVC/ILIACS Result Date: 10/13/2023 ABDOMINAL AORTA STUDY Patient Name:  Bradley Ferguson  Date of Exam:   10/13/2023 Medical Rec #: [id number]       Accession #:    [id number] Date of Birth: [birth year]       Patient Gender: M Patient Age:   21 years Exam Location:  [facility] Procedure:      VAS US AORTA/IVC/ILIACS Referring Phys: [facility] --------------------------------------------------------------------------------  Indications: Peripheral arterial  disease. Patient reports his left lower              extremity symptoms have improved since his procedure in 08/2023.              Now, he reports his right leg has symptoms similar to how the left              leg use to be. He states the right gets numb after walking less               than five minutes, with symptoms usually occurring from the right              calf down. He also reports bilateral cold feet.               Today's ABIs are .28 on the right and .64 on the left. Risk Factors: Hypertension, hyperlipidemia, current smoker. Vascular Interventions: Successful diamondback orbital rotation arthrectomy, PT                         and covered stent at a subtotal calcified proximal right                         common iliac artery on 04/27/2017. Angioplasty and stent                         of left common and external iliac artery, and left                         common femoral endarterectomy including profundoplasty                         and endarterectomy the proximal SFA with bovine                         pericardial patch angioplasty on 09/02/2023.  Comparison Study: In 01/2023, an aortoiliac duplex showed a patent right common                   iliac artery stent without evidence of stenosis, and no                   evidence of stenosis in there right external iliac artery.                   Velocities consistent with >50% stenosis in the left proximal                   common iliac artery followed by mid and distal occlusion, and                   >50% stenosis in the proximal left external iliac artery.                   Infrarenal mid abdominal aortic aneurysm, measuring 3.0 cm. Performing Technologist: Tyna Jaksch RVT  Examination Guidelines: A complete evaluation includes B-mode imaging, spectral Doppler, color Doppler, and power Doppler as needed of all accessible portions of each vessel. Bilateral testing is considered an integral part of a complete examination. Limited examinations for reoccurring indications may be performed as noted.  Abdominal Aorta Findings: +-------------+-------+----------+----------+----------+--------+--------+ Location     AP (cm)Trans (cm)PSV (cm/s)Waveform  ThrombusComments  +-------------+-------+----------+----------+----------+--------+--------+ Proximal  2.00   2.00      74                                   +-------------+-------+----------+----------+----------+--------+--------+ Mid          3.20   3.20      28                          fusiform +-------------+-------+----------+----------+----------+--------+--------+ Distal       2.30   2.30      17                                   +-------------+-------+----------+----------+----------+--------+--------+ RT EIA Prox                   121       monophasic                 +-------------+-------+----------+----------+----------+--------+--------+ RT EIA Mid                    89        monophasic                 +-------------+-------+----------+----------+----------+--------+--------+ RT EIA Distal                 40        monophasic                 +-------------+-------+----------+----------+----------+--------+--------+ IVC/Iliac Findings: +--------+------+--------+--------+   IVC   PatentThrombusComments +--------+------+--------+--------+ IVC Proxpatent                 +--------+------+--------+--------+   Right Stent(s): +-------------------+--------+--------+--------+--------+ Common Iliac ArteryPSV cm/sStenosisWaveformComments +-------------------+--------+--------+--------+--------+ Prox to Stent      57                               +-------------------+--------+--------+--------+--------+ Proximal Stent     68                               +-------------------+--------+--------+--------+--------+ Mid Stent          42                               +-------------------+--------+--------+--------+--------+ Distal Stent       46                               +-------------------+--------+--------+--------+--------+ Distal to Stent    36                               +-------------------+--------+--------+--------+--------+    Left Stent(s): +-------------------+--------+--------+----------+--------+ Common Iliac ArteryPSV cm/sStenosisWaveform  Comments +-------------------+--------+--------+----------+--------+ Prox to Stent      63              monophasic         +-------------------+--------+--------+----------+--------+ Proximal Stent     74              monophasic         +-------------------+--------+--------+----------+--------+ Mid Stent  76              monophasic         +-------------------+--------+--------+----------+--------+ Distal Stent       71              monophasic         +-------------------+--------+--------+----------+--------+ Distal to Stent    70              monophasic         +-------------------+--------+--------+----------+--------+  +---------------------+--------+--------+----------+--------+ External Iliac ArteryPSV cm/sStenosisWaveform  Comments +---------------------+--------+--------+----------+--------+ Prox to Stent        48              monophasic         +---------------------+--------+--------+----------+--------+ Proximal Stent       68              monophasic         +---------------------+--------+--------+----------+--------+ Mid Stent            76              monophasic         +---------------------+--------+--------+----------+--------+ Distal Stent         93              monophasic         +---------------------+--------+--------+----------+--------+ Distal to Stent      112             monophasic         +---------------------+--------+--------+----------+--------+   Summary: Abdominal Aorta: There is evidence of abnormal dilatation of the mid Abdominal aorta. Severe ectasia throught out the abdominal aorta and moderate calcific plaque noted. Severe monophasic waveform pattern in the bilateral iliac arteries and dampened flow  may suggest distal abdominal aortic stenosis. The largest aortic diameter  remains essentially unchanged compared to prior exam. Previous diameter measurement was 3.0 cm obtained on 02/23/2023. Stenosis: +--------------------+-----------+-----------+ Location            Stent      Comments    +--------------------+-----------+-----------+ Right Common Iliac  no stenosis            +--------------------+-----------+-----------+ Left Common Iliac   no stenosis            +--------------------+-----------+-----------+ Right External Iliac           no stenosis +--------------------+-----------+-----------+ Left External Iliac no stenosis            +--------------------+-----------+-----------+  IVC/Iliac: Patent IVC.  *See table(s) above for measurements and observations. See ABI report. Suggest follow up study in 12 months. Suggest Peripheral Vascular Consult.  Electronically signed by Delrae Rend on 10/13/2023 at 6:17:01 PM.    Final    DG C-Arm 1-60 Min Result Date: 09/02/2023 CLINICAL DATA:  Left lower extremity angiogram with iliac stenting EXAM: DG C-ARM 1-60 MIN COMPARISON:  CT 05/29/2023 FINDINGS: Series of subtracted intraoperative C-arm images are submitted, Documenting stenting of  common iliac artery lesion, laterality not Indicated on the images  . IMPRESSION: Iliac arterial intervention as above. Electronically Signed   By: Corlis Leak M.D.   On: 09/02/2023 15:51   MYOCARDIAL PERFUSION IMAGING Result Date: 07/24/2023   The study is normal. The study is low risk.   No ST deviation was noted.   LV perfusion is normal.   Left ventricular function is normal. Nuclear stress EF: 70%. The left ventricular ejection fraction is hyperdynamic (>65%). End diastolic cavity size is normal.  Prior study available for comparison from 10/10/2009. No changes compared to prior study.   Electronically signed by Luana Rumple, MD Low risk stress nuclear study with normal perfusion and normal left ventricular regional and global systolic function.   Recent Results  (from the past 2160 hours)  MYOCARDIAL PERFUSION IMAGING     Status: None   Collection Time: 07/24/23  1:31 PM  Result Value Ref Range   Rest Nuclear Isotope Dose 10.7 mCi   Stress Nuclear Isotope Dose 30.2 mCi   Rest HR 75.0 bpm   Rest BP 149/76 mmHg   Peak HR 85 bpm   Peak BP 148/70 mmHg   SSS 0.0    SRS 0.0    SDS 0.0    TID 1.04    LV sys vol 16.0 mL   LV dias vol 53.0 62 - 150 mL   Nuc Stress EF 70 %   ST Depression (mm) 0 mm  CBC     Status: None   Collection Time: 08/18/23  1:32 PM  Result Value Ref Range   WBC 7.7 4.0 - 10.5 K/uL   RBC 4.83 4.22 - 5.81 MIL/uL   Hemoglobin 14.7 13.0 - 17.0 g/dL   HCT 16.1 09.6 - 04.5 %   MCV 92.8 80.0 - 100.0 fL   MCH 30.4 26.0 - 34.0 pg   MCHC 32.8 30.0 - 36.0 g/dL   RDW 40.9 81.1 - 91.4 %   Platelets 173 150 - 400 K/uL   nRBC 0.0 0.0 - 0.2 %    Comment: Performed at [facility], [address] [place], [place] [address]  Comprehensive metabolic panel     Status: Abnormal   Collection Time: 08/18/23  1:32 PM  Result Value Ref Range   Sodium 141 135 - 145 mmol/L   Potassium 4.6 3.5 - 5.1 mmol/L   Chloride 103 98 - 111 mmol/L   CO2 27 22 - 32 mmol/L   Glucose, Bld 98 70 - 99 mg/dL    Comment: Glucose reference range applies only to samples taken after fasting for at least 8 hours.   BUN 14 8 - 23 mg/dL   Creatinine, Ser 6.21 (H) 0.61 - 1.24 mg/dL   Calcium 9.8 8.9 - 30.8 mg/dL   Total Protein 7.6 6.5 - 8.1 g/dL   Albumin 4.2 3.5 - 5.0 g/dL   AST 27 15 - 41 U/L   ALT 19 0 - 44 U/L   Alkaline Phosphatase 67 38 - 126 U/L   Total Bilirubin 0.7 0.0 - 1.2 mg/dL   GFR, Estimated 55 (L) >60 mL/min    Comment: (NOTE) Calculated using the CKD-EPI Creatinine Equation (2021)    Anion gap 11 5 - 15    Comment: Performed at [facility], [address] [place], [place] [address]  Protime-INR     Status: Abnormal   Collection Time: 08/18/23  1:32 PM  Result Value Ref Range   Prothrombin Time 18.8 (H) 11.4 - 15.2  seconds   INR 1.6 (H) 0.8 - 1.2    Comment: (NOTE) INR goal varies based on device and disease states. Performed at [facility], [address] [place], [place] [address]   APTT     Status: Abnormal   Collection Time: 08/18/23  1:32 PM  Result Value Ref Range   aPTT 43 (H) 24 - 36 seconds    Comment:        IF BASELINE aPTT IS ELEVATED,  SUGGEST PATIENT RISK ASSESSMENT BE USED TO DETERMINE APPROPRIATE ANTICOAGULANT THERAPY. Performed at [facility], [address] [place], [place] [address]   Surgical pcr screen     Status: Abnormal   Collection Time: 08/18/23  1:33 PM   Specimen: Nasal Mucosa; Nasal Swab  Result Value Ref Range   MRSA, PCR (A) NEGATIVE    INVALID, UNABLE TO DETERMINE THE PRESENCE OF TARGET DUE TO SPECIMEN INTEGRITY. RECOLLECTION REQUESTED.   Staphylococcus aureus (A) NEGATIVE    INVALID, UNABLE TO DETERMINE THE PRESENCE OF TARGET DUE TO SPECIMEN INTEGRITY. RECOLLECTION REQUESTED.    Comment: emailed Guadalupe Maple on 08/18/23 @ 1904 by DRT Performed at [facility], [address], [place], [place] [address] CORRECTED ON 02/18 AT 1904: PREVIOUSLY REPORTED AS INVALID, UNABLE TO DETERMINE THE PRESENCE OF TARGET DUE TO SPECIMEN INTEGRITY. RECOLLECTION REQUESTED. emailed Guadalupe Maple on 08/18/23 @ 1903 by DRT`   Type and screen     Status: None   Collection Time: 08/18/23  2:15 PM  Result Value Ref Range   ABO/RH(D) O POS    Antibody Screen NEG    Sample Expiration 09/01/2023,2359    Extend sample reason      NO TRANSFUSIONS OR PREGNANCY IN THE PAST 3 MONTHS Performed at [facility], [address] [place], [place] [address]   Urinalysis, Routine w reflex microscopic -Urine, Clean Catch     Status: None   Collection Time: 08/18/23  2:19 PM  Result Value Ref Range   Color, Urine YELLOW YELLOW   APPearance CLEAR CLEAR   Specific Gravity, Urine 1.010 1.005 - 1.030   pH 5.0 5.0 - 8.0   Glucose, UA NEGATIVE NEGATIVE mg/dL   Hgb urine  dipstick NEGATIVE NEGATIVE   Bilirubin Urine NEGATIVE NEGATIVE   Ketones, ur NEGATIVE NEGATIVE mg/dL   Protein, ur NEGATIVE NEGATIVE mg/dL   Nitrite NEGATIVE NEGATIVE   Leukocytes,Ua NEGATIVE NEGATIVE    Comment: Performed at [facility], [address] [place], [place] [address]  Type and screen [facility]     Status: None   Collection Time: 09/02/23  9:15 AM  Result Value Ref Range   ABO/RH(D) O POS    Antibody Screen NEG    Sample Expiration      09/05/2023,2359 Performed at [facility], [address] [place], [place] [address]   POCT Activated clotting time     Status: None   Collection Time: 09/02/23 10:07 AM  Result Value Ref Range   Activated Clotting Time 285 seconds    Comment: Reference range 74-137 seconds for patients not on anticoagulant therapy.  POCT Activated clotting time     Status: None   Collection Time: 09/02/23 10:36 AM  Result Value Ref Range   Activated Clotting Time 227 seconds    Comment: Reference range 74-137 seconds for patients not on anticoagulant therapy.  POCT Activated clotting time     Status: None   Collection Time: 09/02/23 11:16 AM  Result Value Ref Range   Activated Clotting Time 250 seconds    Comment: Reference range 74-137 seconds for patients not on anticoagulant therapy.  CBC     Status: Abnormal   Collection Time: 09/02/23  5:58 PM  Result Value Ref Range   WBC 8.9 4.0 - 10.5 K/uL   RBC 3.38 (L) 4.22 - 5.81 MIL/uL   Hemoglobin 10.4 (L) 13.0 - 17.0 g/dL   HCT 08.6 (L) 57.8 - 46.9 %  MCV 93.2 80.0 - 100.0 fL   MCH 30.8 26.0 - 34.0 pg   MCHC 33.0 30.0 - 36.0 g/dL   RDW 16.1 09.6 - 04.5 %   Platelets 122 (L) 150 - 400 K/uL    Comment: REPEATED TO VERIFY   nRBC 0.0 0.0 - 0.2 %    Comment: Performed at [facility], [address] [place], [place] [address]  Creatinine, serum     Status: None   Collection Time: 09/02/23  5:58 PM  Result Value Ref Range   Creatinine, Ser 1.00 0.61 - 1.24  mg/dL   GFR, Estimated >19 >14 mL/min    Comment: (NOTE) Calculated using the CKD-EPI Creatinine Equation (2021) Performed at [facility], [address] [place], [place] [address]   CBC     Status: Abnormal   Collection Time: 09/03/23  4:20 AM  Result Value Ref Range   WBC 6.9 4.0 - 10.5 K/uL   RBC 2.96 (L) 4.22 - 5.81 MIL/uL   Hemoglobin 9.0 (L) 13.0 - 17.0 g/dL   HCT 62.1 (L) 30.8 - 65.7 %   MCV 90.9 80.0 - 100.0 fL   MCH 30.4 26.0 - 34.0 pg   MCHC 33.5 30.0 - 36.0 g/dL   RDW 84.6 96.2 - 95.2 %   Platelets 90 (L) 150 - 400 K/uL    Comment: Immature Platelet Fraction may be clinically indicated, consider ordering this additional test [id number] REPEATED TO VERIFY    nRBC 0.0 0.0 - 0.2 %    Comment: Performed at [facility], [address] [place], [place] [address]  Basic metabolic panel     Status: Abnormal   Collection Time: 09/03/23  4:20 AM  Result Value Ref Range   Sodium 135 135 - 145 mmol/L   Potassium 4.0 3.5 - 5.1 mmol/L   Chloride 108 98 - 111 mmol/L   CO2 24 22 - 32 mmol/L   Glucose, Bld 119 (H) 70 - 99 mg/dL    Comment: Glucose reference range applies only to samples taken after fasting for at least 8 hours.   BUN 12 8 - 23 mg/dL   Creatinine, Ser 5.36 0.61 - 1.24 mg/dL   Calcium 7.7 (L) 8.9 - 10.3 mg/dL   GFR, Estimated >64 >40 mL/min    Comment: (NOTE) Calculated using the CKD-EPI Creatinine Equation (2021)    Anion gap 3 (L) 5 - 15    Comment: Performed at [facility], [address], [place], [place] [address]  VAS US  ABI WITH/WO TBI     Status: None   Collection Time: 10/13/23  9:19 AM  Result Value Ref Range   Right ABI .28    Left ABI .64         Carnell Christian, MD, MS

## 2023-10-26 NOTE — Progress Notes (Unsigned)
 HISTORY AND PHYSICAL     CC:  follow up. Requesting Provider:  [name], MD  HPI: This is a 73 y.o. male who is here today for follow up for PAD.  Pt has hx of atrial fibrillation on Xarelto , tobacco abuse, hyperlipidemia that presents for evaluation of right femoral endarterectomy and left femoral endarterectomy with retrograde stenting for disabling claudication. Patient has been under the care of Dr. Katheryne Pane with cardiology. He does have a history of right common iliac artery stenting in 2018 by Dr. Katheryne Pane. Most recently underwent angiogram on 07/09/2023 showing a left common iliac stenosis of 90% with significant bilateral common femoral disease. Also known to have bilateral SFA occlusions. Patient had ABIs on 02/23/2023 that were 0.55 on the right and 0.39 on the left.   He underwent aortogram with angioplasty and stent of left common iliac artery via retrograde access, angioplasty and stent of the left external iliac artery via retrograde access, left common femoral endarterectomy including profundoplasty and endarterectomy the proximal SFA with bovine pericardial patch angioplasty on 09/02/2023 by Dr. 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    The pt returns today for follow up.  He states that his left leg is doing well and his ulcer is getting better.  He states that he is having more pain in the right foot and has an ulceration on the right heel and medial ankle.  He states he gets a stabbing pain in the foot around the ankle.  He is compliant with his plavix  and xarelto  and statin.  He is still smoking but has cut back considerably.  He states he has 1-2 in the morning with his coffee but has pretty much cut out the rest.    The pt is on a statin for cholesterol management.    The pt is not on an aspirin .    Other AC:  Plavix /Xarelto  The pt is not on medication for hypertension.  The pt is not on medication for diabetes. Tobacco hx:   current    . PHYSICAL EXAMINATION:  Today's Vitals   10/27/23 0943 10/27/23 0949  BP: 139/81   Pulse: 100   Resp: 18   Temp: 98.2 F (36.8 C)   TempSrc: Temporal   SpO2: 95%   Weight: 178 lb (80.7 kg)   Height: 6\' 1"  (1.854 m)   PainSc: 0-No pain 8    Body mass index is 23.48 kg/m.   General:  WDWN in NAD; vital signs documented above Gait: Not observed HENT: WNL, normocephalic Pulmonary: normal non-labored breathing , without wheezing Cardiac: regular HR Skin: some raw skin on the right heel where he has placed tape to cover ulcer Vascular Exam/Pulses: Palpable femoral pulses bilaterally; right is 1+ Faint monophasic right DP doppler signal Brisk monophasic left DP/PT doppler flow Extremities:   Right foot   Right medial foot   Left heel      Non-Invasive Vascular Imaging:   ABI's/TBI's on 10/13/2023: Right:  0.28/0 - Great toe pressure: 0 Left:  0.64/0.30 - Great toe pressure: 36  Arterial duplex on 10/13/2023: Abdominal Aorta Findings:  +-------------+-------+----------+----------+----------+--------+--------+  Location    AP (cm)Trans (cm)PSV (cm/s)Waveform  ThrombusComments  +-------------+-------+----------+----------+----------+--------+--------+  Proximal    2.00   2.00      74                                    +-------------+-------+----------+----------+----------+--------+--------+  Mid         3.20   3.20      28                          fusiform  +-------------+-------+----------+----------+----------+--------+--------+  Distal      2.30   2.30      17                                    +-------------+-------+----------+----------+----------+--------+--------+  RT EIA Prox                   121       monophasic                  +-------------+-------+----------+----------+----------+--------+--------+  RT EIA Mid                    89        monophasic                   +-------------+-------+----------+----------+----------+--------+--------+  RT EIA Distal                 40        monophasic                  +-------------+-------+----------+----------+----------+--------+--------+    IVC/Iliac Findings:  +--------+------+--------+--------+   IVC   PatentThrombusComments  +--------+------+--------+--------+  IVC Proxpatent                  +--------+------+--------+--------+   Right Stent(s):  +-------------------+--------+--------+--------+--------+  Common Iliac ArteryPSV cm/sStenosisWaveformComments  +-------------------+--------+--------+--------+--------+  Prox to Stent      57                                +-------------------+--------+--------+--------+--------+  Proximal Stent     68                                +-------------------+--------+--------+--------+--------+  Mid Stent          42                                +-------------------+--------+--------+--------+--------+  Distal Stent       46                                +-------------------+--------+--------+--------+--------+  Distal to Stent    36                                +-------------------+--------+--------+--------+--------+   Left Stent(s):  +-------------------+--------+--------+----------+--------+  Common Iliac ArteryPSV cm/sStenosisWaveform  Comments  +-------------------+--------+--------+----------+--------+  Prox to Stent      63              monophasic          +-------------------+--------+--------+----------+--------+  Proximal Stent     74              monophasic          +-------------------+--------+--------+----------+--------+  Mid Stent          76  monophasic          +-------------------+--------+--------+----------+--------+  Distal Stent       71              monophasic          +-------------------+--------+--------+----------+--------+   Distal to Stent    70              monophasic          +-------------------+--------+--------+----------+--------+   +---------------------+--------+--------+----------+--------+  External Iliac ArteryPSV cm/sStenosisWaveform  Comments  +---------------------+--------+--------+----------+--------+  Prox to Stent        48              monophasic          +---------------------+--------+--------+----------+--------+  Proximal Stent       68              monophasic          +---------------------+--------+--------+----------+--------+  Mid Stent            76              monophasic          +---------------------+--------+--------+----------+--------+  Distal Stent         93              monophasic          +---------------------+--------+--------+----------+--------+  Distal to Stent      112             monophasic          +---------------------+--------+--------+----------+--------+   Summary:  Abdominal Aorta: There is evidence of abnormal dilatation of the mid  Abdominal aorta. Severe ectasia throught out the abdominal aorta and  moderate calcific plaque noted. Severe monophasic waveform pattern in the bilateral iliac arteries and dampened flow   may suggest distal abdominal aortic stenosis. The largest aortic diameter remains essentially unchanged compared to prior exam. Previous diameter measurement was 3.0 cm obtained on 02/23/2023.   Stenosis: +--------------------+-----------+-----------+  Location            Stent      Comments     +--------------------+-----------+-----------+  Right Common Iliac  no stenosis             +--------------------+-----------+-----------+  Left Common Iliac   no stenosis             +--------------------+-----------+-----------+  Right External Iliac           no stenosis  +--------------------+-----------+-----------+  Left External Iliac no stenosis              +--------------------+-----------+-----------+      IVC/Iliac: Patent IVC.   Previous ABI's/TBI's on 02/23/2023: Right:  0.55/0 - Great toe pressure: 0 Left:  0.38/0 - Great toe pressure:  0    ASSESSMENT/PLAN:: 73 y.o. male here for follow up for PAD with hx of  angioplasty and stent of left common iliac artery via retrograde access, angioplasty and stent of the left external iliac artery via retrograde access, left common femoral endarterectomy including profundoplasty and endarterectomy the proximal SFA with bovine pericardial patch angioplasty on 09/02/2023 by Dr. 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    -pt left leg much improved and ulceration improving.  His ABI did improve as well as toe pressure. Iliac stent is patent.  -on the right leg, he does have a heel ulceration and medial malleolus ulcer.  He is having a new pain in the foot.  He has toe pressure of zero and decreased ABI to 0.28.   -reviewed pt with Dr. [name] and he saw pt and discussed that he will need a femoral endarterectomy with possible femoral to popliteal bypass in the next couple of weeks.   -continue statin and Xarelto .  Ok to stop the Plavix  and resume baby asa.  -discussed importance of smoking cessation and the risk of limb loss, heart attack and stroke as well as cancers.  He has cut back considerably.  Encouraged him to continue to work on this.   -he will need to be off his Xarelto  for 2-3 days prior to surgery.    Maryanna Smart, [facility] [phone]  Clinic MD:   [name]

## 2023-10-27 ENCOUNTER — Encounter: Payer: Self-pay | Admitting: Physician Assistant

## 2023-10-27 ENCOUNTER — Ambulatory Visit: Attending: Vascular Surgery | Admitting: Physician Assistant

## 2023-10-27 VITALS — BP 139/81 | HR 100 | Temp 98.2°F | Resp 18 | Ht 73.0 in | Wt 178.0 lb

## 2023-10-27 DIAGNOSIS — I70222 Atherosclerosis of native arteries of extremities with rest pain, left leg: Secondary | ICD-10-CM

## 2023-10-27 DIAGNOSIS — I70221 Atherosclerosis of native arteries of extremities with rest pain, right leg: Secondary | ICD-10-CM

## 2023-10-28 ENCOUNTER — Other Ambulatory Visit: Payer: Self-pay

## 2023-10-28 DIAGNOSIS — I70221 Atherosclerosis of native arteries of extremities with rest pain, right leg: Secondary | ICD-10-CM

## 2023-11-02 ENCOUNTER — Ambulatory Visit (HOSPITAL_COMMUNITY)
Admission: RE | Admit: 2023-11-02 | Discharge: 2023-11-02 | Disposition: A | Discharge status: Home / Self Care | Payer: PPO | Source: Ambulatory Visit | Attending: Physician Assistant | Admitting: Physician Assistant

## 2023-11-02 ENCOUNTER — Encounter (HOSPITAL_COMMUNITY): Payer: Self-pay | Admitting: Physician Assistant

## 2023-11-02 VITALS — BP 116/66 | HR 72 | Ht 73.0 in | Wt 167.4 lb

## 2023-11-02 DIAGNOSIS — Z5181 Encounter for therapeutic drug level monitoring: Secondary | ICD-10-CM | POA: Diagnosis not present

## 2023-11-02 DIAGNOSIS — D6869 Other thrombophilia: Secondary | ICD-10-CM | POA: Diagnosis not present

## 2023-11-02 DIAGNOSIS — I480 Paroxysmal atrial fibrillation: Secondary | ICD-10-CM

## 2023-11-02 DIAGNOSIS — Z79899 Other long term (current) drug therapy: Secondary | ICD-10-CM | POA: Diagnosis not present

## 2023-11-02 DIAGNOSIS — I4819 Other persistent atrial fibrillation: Secondary | ICD-10-CM | POA: Diagnosis not present

## 2023-11-02 NOTE — Progress Notes (Addendum)
 Surgical Instructions   Your procedure is scheduled on Monday, May 12th. Report to [facility] Main Entrance "A" at 5:30 A.M., then check in with the Admitting office. Any questions or running late day of surgery: call [phone]  Questions prior to your surgery date: call [phone], Monday-Friday, 8am-4pm. If you experience any cold or flu symptoms such as cough, fever, chills, shortness of breath, etc. between now and your scheduled surgery, please notify us  at the above number.     Remember:  Do not eat or drink after midnight the night before your surgery   Take these medicines the morning of surgery with A SIP OF WATER   aspirin   dofetilide  (TIKOSYN )  gabapentin  (NEURONTIN )    May take these medicines IF NEEDED: albuterol  (VENTOLIN  HFA) inhaler - please bring with you on morning of surgery    Per you cardiologists instruction; HOLD your rivaroxaban  (XARELTO ) for 3 days prior to surgery.  Last dose should be taken on Thursday, May 8th.  Follow your surgeon's instructions on when to stop clopidogrel  (PLAVIX ).  If no instructions were given by your surgeon then you will need to call the office to get those instructions.     One week prior to surgery, STOP taking any Aleve, Naproxen, Ibuprofen, Motrin, Advil, Goody's, BC's, all herbal medications, fish oil, and non-prescription vitamins.                     Do NOT Smoke (Tobacco/Vaping) for 24 hours prior to your procedure.  If you use a CPAP at night, you may bring your mask/headgear for your overnight stay.   You will be asked to remove any contacts, glasses, piercing's, hearing aid's, dentures/partials prior to surgery. Please bring cases for these items if needed.    Patients discharged the day of surgery will not be allowed to drive home, and someone needs to stay with them for 24 hours.  SURGICAL WAITING ROOM VISITATION Patients may have no more than 2 support people in the waiting area - these visitors may rotate.    Pre-op nurse will coordinate an appropriate time for 1 ADULT support person, who may not rotate, to accompany patient in pre-op.  Children under the age of 4 must have an adult with them who is not the patient and must remain in the main waiting area with an adult.  If the patient needs to stay at the hospital during part of their recovery, the visitor guidelines for inpatient rooms apply.  Please refer to the [facility] website for the visitor guidelines for any additional information.   If you received a COVID test during your pre-op visit  it is requested that you wear a mask when out in public, stay away from anyone that may not be feeling well and notify your surgeon if you develop symptoms. If you have been in contact with anyone that has tested positive in the last 10 days please notify you surgeon.      Pre-operative CHG Bathing Instructions   You can play a key role in reducing the risk of infection after surgery. Your skin needs to be as free of germs as possible. You can reduce the number of germs on your skin by washing with CHG (chlorhexidine  gluconate) soap before surgery. CHG is an antiseptic soap that kills germs and continues to kill germs even after washing.   DO NOT use if you have an allergy to chlorhexidine /CHG or antibacterial soaps. If your skin becomes reddened or irritated, stop  using the CHG and notify one of our RNs at [phone].              TAKE A SHOWER THE NIGHT BEFORE SURGERY AND THE DAY OF SURGERY    Please keep in mind the following:  DO NOT shave, including legs and underarms, 48 hours prior to surgery.   You may shave your face before/day of surgery.  Place clean sheets on your bed the night before surgery Use a clean washcloth (not used since being washed) for each shower. DO NOT sleep with pet's night before surgery.  CHG Shower Instructions:  Wash your face and private area with normal soap. If you choose to wash your hair, wash first with  your normal shampoo.  After you use shampoo/soap, rinse your hair and body thoroughly to remove shampoo/soap residue.  Turn the water  OFF and apply half the bottle of CHG soap to a CLEAN washcloth.  Apply CHG soap ONLY FROM YOUR NECK DOWN TO YOUR TOES (washing for 3-5 minutes)  DO NOT use CHG soap on face, private areas, open wounds, or sores.  Pay special attention to the area where your surgery is being performed.  If you are having back surgery, having someone wash your back for you may be helpful. Wait 2 minutes after CHG soap is applied, then you may rinse off the CHG soap.  Pat dry with a clean towel  Put on clean pajamas    Additional instructions for the day of surgery: DO NOT APPLY any lotions, deodorants, or cologne.   Do not wear jewelry Do not bring valuables to the hospital. [facility] is not responsible for valuables/personal belongings. Put on clean/comfortable clothes.  Please brush your teeth.  Ask your nurse before applying any prescription medications to the skin.

## 2023-11-02 NOTE — Progress Notes (Signed)
 Primary Care Physician: [name], MD Primary Cardiologist: Lauro Portal, MD Electrophysiologist: Will Cortland Ding, MD  Referring Physician: Dr Marchia Seton is a 73 y.o. male with a history of PAD, HLD, COPD, tobacco use, atrial flutter, atrial fibrillation who presents for follow up in the [facility].  The patient is s/p flutter ablation in 2011. He has been maintained on dofetilide  for afib. Patient is on Xarelto  for stroke prevention.   Patient presents today for follow up for atrial fibrillation and dofetilide  monitoring. He remains in SR. He denies any interim symptoms of afib. He did have blood in his urine over the weekend. He held his Xarelto  and this resolved. He tried taking another dose and the blood returned. He had a similar presentation 1-2 years ago and was worked up by urology. No source of bleeding was found.   Today, he denies symptoms of palpitations, chest pain, shortness of breath, orthopnea, PND, lower extremity edema, dizziness, presyncope, syncope, snoring, daytime somnolence, or neurologic sequela. The patient is tolerating medications without difficulties and is otherwise without complaint today.    Atrial Fibrillation Risk Factors:  he does not have symptoms or diagnosis of sleep apnea. he does not have a history of rheumatic fever.   Atrial Fibrillation Management history:  Previous antiarrhythmic drugs: dofetilide   Previous cardioversions: none Previous ablations: flutter ablation 2011 Anticoagulation history: Xarelto    ROS- All systems are reviewed and negative except as per the HPI above.  Past Medical History:  Diagnosis Date   Anxiety    Atrial fibrillation (HCC)    Blood transfusion without reported diagnosis    Cataract    Depression    Emphysema of lung (HCC)    Heart murmur    Hyperlipidemia    Hypertension    PVD (peripheral vascular disease) (HCC)    Raynaud's syndrome     Substance abuse (HCC)    Tobacco   Systemic sclerosis (HCC)    TB lung, latent 12/19/2019   s/p INH x 3 months   Transaminitis 06/18/2020    Current Outpatient Medications  Medication Sig Dispense Refill   albuterol  (VENTOLIN  HFA) 108 (90 Base) MCG/ACT inhaler Use  2 inhalations  15 minutes apart  every 4 hours  to rescue Asthma Attack 48 g 3   aspirin  EC 81 MG tablet Take 81 mg by mouth daily. Swallow whole.     atorvastatin  (LIPITOR ) 80 MG tablet Take 1 tablet Daily for Cholesterol (Patient taking differently: Take 80 mg by mouth every evening. Take 1 tablet Daily for Cholesterol) 90 tablet 0   Cholecalciferol  250 MCG (10000 UT) CAPS Take 10,000 Units by mouth every evening.     clopidogrel  (PLAVIX ) 75 MG tablet Take 1 tablet (75 mg total) by mouth daily at 6 (six) AM. 30 tablet 1   dofetilide  (TIKOSYN ) 500 MCG capsule TAKE 1 CAPSULE BY MOUTH 2 TIMES DAILY. 180 capsule 3   gabapentin  (NEURONTIN ) 300 MG capsule Take 1 capsule 3 x /day for Neuropathy Pain 270 capsule 3   ketoconazole  (NIZORAL ) 2 % cream Apply 1 Application topically daily as needed for irritation.     pyridOXINE (VITAMIN B-6) 100 MG tablet Take 100 mg by mouth every evening.     rivaroxaban  (XARELTO ) 20 MG TABS tablet Take 1 tablet by mouth daily to Prevent Blood Clots (Patient taking differently: Take 20 mg by mouth daily after supper. Take 1 tablet by mouth daily to Prevent Blood Clots)  90 tablet 3   triamcinolone  cream (KENALOG ) 0.5 % Apply 1 Application topically daily as needed (skin irritation).     No current facility-administered medications for this encounter.    Physical Exam: BP 116/66   Pulse 72   Ht 6\' 1"  (1.854 m)   Wt 75.9 kg   BMI 22.09 kg/m   GEN: Well nourished, well developed in no acute distress CARDIAC: Regular rate and rhythm, no murmurs, rubs, gallops RESPIRATORY:  Clear to auscultation without rales, wheezing or rhonchi  ABDOMEN: Soft, non-tender, non-distended EXTREMITIES:  No edema; No  deformity   Wt Readings from Last 3 Encounters:  11/02/23 75.9 kg  10/27/23 80.7 kg  10/14/23 77.1 kg     EKG today demonstrates  SR, RBBB Vent. rate 72 BPM PR interval 172 ms QRS duration 128 ms QT/QTcB 438/479 ms   Echo 03/08/20 demonstrated   1. Left ventricular ejection fraction, by estimation, is 65 to 70%. The  left ventricle has hyperdynamic function. The left ventricle has no  regional wall motion abnormalities. There is mild left ventricular  hypertrophy. Left ventricular diastolic parameters are consistent with Grade II diastolic dysfunction (pseudonormalization).   2. Right ventricular systolic function is normal. The right ventricular  size is normal. There is normal pulmonary artery systolic pressure. The  estimated right ventricular systolic pressure is 26.4 mmHg.   3. Left atrial size was mildly dilated.   4. Chiari network noted. Right atrial size was mildly dilated.   5. The mitral valve is normal in structure. No evidence of mitral valve  regurgitation. No evidence of mitral stenosis. Moderate mitral annular  calcification.   6. The aortic valve is tricuspid. Aortic valve regurgitation is not  visualized. Mild aortic valve sclerosis is present, with no evidence of  aortic valve stenosis.   7. The inferior vena cava is normal in size with greater than 50%  respiratory variability, suggesting right atrial pressure of 3 mmHg.     CHA2DS2-VASc Score = 3  The patient's score is based upon: CHF History: 0 HTN History: 1 Diabetes History: 0 Stroke History: 0 Vascular Disease History: 1 Age Score: 1 Gender Score: 0       ASSESSMENT AND PLAN: Persistent Atrial Fibrillation/atrial flutter The patient's CHA2DS2-VASc score is 3, indicating a 3.2% annual risk of stroke.   S/p flutter ablation 2011 Patient appears to be maintaining SR Continue dofetilide  500 mcg BID Continue Xarelto  20 mg daily, see plan below.   Secondary Hypercoagulable State (ICD10:   D68.69) The patient is at significant risk for stroke/thromboembolism based upon his CHA2DS2-VASc Score of 3.  Continue Rivaroxaban  (Xarelto ). Patient noted blood in urine. Similar presentation 1-2 years ago, seen by urology and no bleeding source found. He will need to hold his Xarelto  for his upcoming vascular surgery with Dr [name] on 5/15. Will have him hold his Xarelto  for now and resume as instructed post surgery. If he has more hematuria, he will need referral back to urology.   High Risk Medication Monitoring (ICD 10: Z79.899) QT interval on ECG acceptable for dofetilide  monitoring. Check bmet/mag today.      HTN Stable on current regimen  PAD Followed by Dr Katheryne Pane Planned for femoral endarterectomy with Dr [name] 5/15  CAD CAC score 1483 Stress test 07/24/23 low risk for ischemia No anginal symptoms  Followed by Dr Katheryne Pane    Follow up in the AF clinic in 6 months for dofetilide  monitoring.        Myrtha Ates PA-C  [facility] [address] [place], [place] [address] [phone]

## 2023-11-03 ENCOUNTER — Telehealth: Payer: Self-pay

## 2023-11-03 ENCOUNTER — Other Ambulatory Visit: Payer: Self-pay

## 2023-11-03 ENCOUNTER — Encounter (HOSPITAL_COMMUNITY)
Admission: RE | Admit: 2023-11-03 | Discharge: 2023-11-03 | Disposition: A | Discharge status: Home / Self Care | Source: Ambulatory Visit | Attending: Vascular Surgery | Admitting: Vascular Surgery

## 2023-11-03 ENCOUNTER — Encounter (HOSPITAL_COMMUNITY): Payer: Self-pay

## 2023-11-03 VITALS — BP 146/78 | HR 74 | Temp 97.8°F | Resp 18 | Ht 73.5 in | Wt 168.0 lb

## 2023-11-03 DIAGNOSIS — F17200 Nicotine dependence, unspecified, uncomplicated: Secondary | ICD-10-CM | POA: Insufficient documentation

## 2023-11-03 DIAGNOSIS — Z01812 Encounter for preprocedural laboratory examination: Secondary | ICD-10-CM | POA: Insufficient documentation

## 2023-11-03 DIAGNOSIS — E785 Hyperlipidemia, unspecified: Secondary | ICD-10-CM | POA: Insufficient documentation

## 2023-11-03 DIAGNOSIS — I4892 Unspecified atrial flutter: Secondary | ICD-10-CM | POA: Insufficient documentation

## 2023-11-03 DIAGNOSIS — I4891 Unspecified atrial fibrillation: Secondary | ICD-10-CM | POA: Insufficient documentation

## 2023-11-03 DIAGNOSIS — J849 Interstitial pulmonary disease, unspecified: Secondary | ICD-10-CM | POA: Diagnosis not present

## 2023-11-03 DIAGNOSIS — I70221 Atherosclerosis of native arteries of extremities with rest pain, right leg: Secondary | ICD-10-CM | POA: Insufficient documentation

## 2023-11-03 DIAGNOSIS — J439 Emphysema, unspecified: Secondary | ICD-10-CM | POA: Diagnosis not present

## 2023-11-03 DIAGNOSIS — I10 Essential (primary) hypertension: Secondary | ICD-10-CM | POA: Insufficient documentation

## 2023-11-03 DIAGNOSIS — Z01818 Encounter for other preprocedural examination: Secondary | ICD-10-CM

## 2023-11-03 LAB — CBC
HCT: 42.2 % (ref 39.0–52.0)
Hemoglobin: 13.6 g/dL (ref 13.0–17.0)
MCH: 29.4 pg (ref 26.0–34.0)
MCHC: 32.2 g/dL (ref 30.0–36.0)
MCV: 91.3 fL (ref 80.0–100.0)
Platelets: 209 10*3/uL (ref 150–400)
RBC: 4.62 MIL/uL (ref 4.22–5.81)
RDW: 14.2 % (ref 11.5–15.5)
WBC: 8 10*3/uL (ref 4.0–10.5)
nRBC: 0 % (ref 0.0–0.2)

## 2023-11-03 LAB — TYPE AND SCREEN
ABO/RH(D): O POS
Antibody Screen: NEGATIVE

## 2023-11-03 LAB — URINALYSIS, ROUTINE W REFLEX MICROSCOPIC
Bilirubin Urine: NEGATIVE
Glucose, UA: NEGATIVE mg/dL
Ketones, ur: NEGATIVE mg/dL
Leukocytes,Ua: NEGATIVE
Nitrite: NEGATIVE
Protein, ur: NEGATIVE mg/dL
RBC / HPF: >50 RBC/hpf (ref 0–5)
Specific Gravity, Urine: 1.011 (ref 1.005–1.030)
pH: 6 (ref 5.0–8.0)

## 2023-11-03 LAB — COMPREHENSIVE METABOLIC PANEL WITH GFR
ALT: 14 U/L (ref 0–44)
AST: 21 U/L (ref 15–41)
Albumin: 3.8 g/dL (ref 3.5–5.0)
Alkaline Phosphatase: 76 U/L (ref 38–126)
Anion gap: 10 (ref 5–15)
BUN: 13 mg/dL (ref 8–23)
CO2: 26 mmol/L (ref 22–32)
Calcium: 9.8 mg/dL (ref 8.9–10.3)
Chloride: 102 mmol/L (ref 98–111)
Creatinine, Ser: 0.91 mg/dL (ref 0.61–1.24)
GFR, Estimated: >60 mL/min (ref >60)
Glucose, Bld: 102 mg/dL — ABNORMAL HIGH (ref 70–99)
Potassium: 4.6 mmol/L (ref 3.5–5.1)
Sodium: 138 mmol/L (ref 135–145)
Total Bilirubin: 0.8 mg/dL (ref 0.0–1.2)
Total Protein: 7.6 g/dL (ref 6.5–8.1)

## 2023-11-03 LAB — BASIC METABOLIC PANEL WITH GFR
BUN/Creatinine Ratio: 17 (ref 10–24)
BUN: 13 mg/dL (ref 8–27)
CO2: 22 mmol/L (ref 20–29)
Calcium: 9.6 mg/dL (ref 8.6–10.2)
Chloride: 101 mmol/L (ref 96–106)
Creatinine, Ser: 0.78 mg/dL (ref 0.76–1.27)
Glucose: 78 mg/dL (ref 70–99)
Potassium: 4.5 mmol/L (ref 3.5–5.2)
Sodium: 140 mmol/L (ref 134–144)
eGFR: 95 mL/min/{1.73_m2} (ref >59)

## 2023-11-03 LAB — MAGNESIUM: Magnesium: 2.3 mg/dL (ref 1.6–2.3)

## 2023-11-03 LAB — SURGICAL PCR SCREEN

## 2023-11-03 LAB — PROTIME-INR
INR: 1.2 (ref 0.8–1.2)
Prothrombin Time: 15 s (ref 11.4–15.2)

## 2023-11-03 LAB — APTT: aPTT: 39 s — ABNORMAL HIGH (ref 24–36)

## 2023-11-03 NOTE — Telephone Encounter (Signed)
 Spoke to patient in regards to recent blood in urine after switching to Xarelto .  Per Dr. [name], please hold Xarelto  but continue taking aspirin  until after surgery. Patient acknowledge instructions.

## 2023-11-03 NOTE — Progress Notes (Signed)
 PCP - [name], MD Cardiologist - Lauro Portal, MD  PPM/ICD - denies Device Orders -n/a  Rep Notified - n/a  Chest x-ray - n/a EKG - 11/02/2023 Stress Test - 07/24/2023 ECHO - 03/08/2020 Cardiac Cath - 04/30/2010  Sleep Study - denies CPAP - n/a  Fasting Blood Sugar - no DM Checks Blood Sugar _____ times a day  Last dose of GLP1 agonist-  n/a GLP1 instructions: n/a  Blood Thinner Instructions: hold Xarelto  for 3 days prior to procedure. Last dose- 11/05/2023 Aspirin  Instructions: continue   ERAS Protcol -no, NPO at midnight PRE-SURGERY Ensure or G2- n/a  COVID TEST- n/a   Anesthesia review: yes, cardiac history.  Pt states that when he started taking Asprin on 04/29 in addition to Xarelto  he started having blood in his urine and did not take any blood thinners till May 4th (urine became clear.) Pt states he took Xarelto  04/05 and had some blood in his urine again on Monday, May 5th. He saw Valeda Garter PA at Afib clinic on 05/05 and was told to hold Xarelto  until his surgery. Per order from dr. Spurgeon Dyer office "pt may continue to take aspirin ." Dany Dyke, RN from Dr. Melvinia Stager office is aware and will reach out to dr. [name].    Patient denies shortness of breath, fever, cough and chest pain at PAT appointment   All instructions explained to the patient, with a verbal understanding of the material. Patient agrees to go over the instructions while at home for a better understanding. Patient also instructed to self quarantine after being tested for COVID-19. The opportunity to ask questions was provided.

## 2023-11-04 NOTE — Anesthesia Preprocedure Evaluation (Addendum)
 Anesthesia Evaluation  Patient identified by MRN, date of birth, ID band Patient awake    Reviewed: Allergy & Precautions, NPO status , Patient's Chart, lab work & pertinent test results  Airway Mallampati: I  TM Distance: >3 FB Neck ROM: Full    Dental  (+) Edentulous Upper, Edentulous Lower   Pulmonary COPD, Current Smoker and Patient abstained from smoking.   breath sounds clear to auscultation       Cardiovascular hypertension, + CAD and + Peripheral Vascular Disease  + dysrhythmias Atrial Fibrillation  Rhythm:Regular Rate:Normal     Neuro/Psych  PSYCHIATRIC DISORDERS Anxiety Depression Bipolar Disorder    Neuromuscular disease    GI/Hepatic negative GI ROS, Neg liver ROS,,,  Endo/Other  negative endocrine ROS    Renal/GU negative Renal ROS     Musculoskeletal negative musculoskeletal ROS (+)    Abdominal   Peds  Hematology negative hematology ROS (+)   Anesthesia Other Findings   Reproductive/Obstetrics                             Anesthesia Physical Anesthesia Plan  ASA: 3  Anesthesia Plan: General   Post-op Pain Management: Tylenol  PO (pre-op)*   Induction: Intravenous  PONV Risk Score and Plan: 2 and Ondansetron  and Treatment may vary due to age or medical condition  Airway Management Planned: Oral ETT  Additional Equipment: Arterial line  Intra-op Plan:   Post-operative Plan: Extubation in OR  Informed Consent: I have reviewed the patients History and Physical, chart, labs and discussed the procedure including the risks, benefits and alternatives for the proposed anesthesia with the patient or authorized representative who has indicated his/her understanding and acceptance.       Plan Discussed with: CRNA and Anesthesiologist  Anesthesia Plan Comments: (PAT note by Rudy Costain, PA-C:  73 year old male follows with cardiology for history of PAD, HTN, HLD, atrial  flutter/atrial fibrillation s/p ablation 2011, elevated coronary calcium  score 1483.  Nuclear stress test 07/2023 was low risk, negative for ischemia.  He has been maintained on dofetilide  for A-fib.  Last seen in the A-fib clinic by Valeda Garter, PA on 11/02/2023 and noted to be maintaining sinus rhythm.  Patient reported hematuria with Xarelto  that resolved with discontinuation and reoccurred after reinitiating.  He evidently had similar issues with this in the past.  Per note, "Patient noted blood in urine. Similar presentation 1-2 years ago, seen by urology and no bleeding source found. He will need to hold his Xarelto  for his upcoming vascular surgery with Dr [name] on 5/15. Will have him hold his Xarelto  for now and resume as instructed post surgery. If he has more hematuria, he will need referral back to urology."  Follows with vascular surgery for history of PAD s/p right common iliac artery stenting 2018.  Recently he underwent aortogram with angioplasty and stent of left common iliac artery via retrograde access, angioplasty and stent of the left external iliac artery via retrograde access, left common femoral endarterectomy including profundoplasty and endarterectomy the proximal SFA with bovine pericardial patch angioplasty on 09/02/2023 by Dr. 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  Last seen in follow-up on 10/27/2023, left leg much improved, however, he was noted to have heel ulceration and medial malleolus ulcer on the right as well as new foot pain.  Femoral endarterectomy with possible femoral to popliteal bypass was recommended.  Patient currently holding Xarelto  due to recent hematuria. He  is taking 81 mg ASA as instructed by vascular surgery.  Other pertinent history includes current smoker, emphysema, ILD (thought due to limited systemic sclerosis; declined therapy 2023; has albuterol  as needed), Raynaud's syndrome, latent TB (on INH x 3 months 2021,  developed transaminitis).  Preop labs reviewed, unremarkable.  EKG 11/02/2023: Normal sinus rhythm.  Rate 72. Right bundle branch block  CT chest lung cancer screening 06/04/2021: MPRESSION: 1. Lung-RADS 2, benign appearance or behavior. Continue annual screening with low-dose chest CT without contrast in 12 months. 2. Peripheral basilar predominant pulmonary fibrosis, similar to 01/19/2020. Findings were categorized as probable usual interstitial pneumonitis on 07/28/2019. 3. Aortic atherosclerosis (ICD10-I70.0). Coronary artery calcification. 4.  Emphysema (ICD10-J43.9).  Nuclear stress test 07/24/23:   The study is normal. The study is low risk.   No ST deviation was noted.   LV perfusion is normal.   Left ventricular function is normal. Nuclear stress EF: 70%. The left ventricular ejection fraction is hyperdynamic (>65%). End diastolic cavity size is normal.   Prior study available for comparison from 10/10/2009. No changes compared to prior study.   Electronically signed by Luana Rumple, MD  Low risk stress nuclear study with normal perfusion and normal left ventricular regional and global systolic function.   CT Coronary Calcium  Scoring 07/07/23: IMPRESSION: 1. Coronary calcium  score of 1483. This was 90th percentile for age-, race-, and sex-matched controls. 2.  Severe mitral annular calcifications. RECOMMENDATIONS:... Cardiology referral should be considered for patients with CAC scores >=400 or >=75th percentile... Dr. Katheryne Pane recommended proceeding with a Lexiscan  Myoview .   Echo 03/08/20: IMPRESSIONS  1. Left ventricular ejection fraction, by estimation, is 65 to 70%. The  left ventricle has hyperdynamic function. The left ventricle has no  regional wall motion abnormalities. There is mild left ventricular  hypertrophy. Left ventricular diastolic  parameters are consistent with Grade II diastolic dysfunction  (pseudonormalization).  2. Right ventricular  systolic function is normal. The right ventricular  size is normal. There is normal pulmonary artery systolic pressure. The  estimated right ventricular systolic pressure is 26.4 mmHg.  3. Left atrial size was mildly dilated.  4. Chiari network noted. Right atrial size was mildly dilated.  5. The mitral valve is normal in structure. No evidence of mitral valve  regurgitation. No evidence of mitral stenosis. Moderate mitral annular  calcification.  6. The aortic valve is tricuspid. Aortic valve regurgitation is not  visualized. Mild aortic valve sclerosis is present, with no evidence of  aortic valve stenosis.  7. The inferior vena cava is normal in size with greater than 50%  respiratory variability, suggesting right atrial pressure of 3 mmHg.    )       Anesthesia Quick Evaluation

## 2023-11-04 NOTE — Progress Notes (Signed)
 Surgical PCR collected at pre-op appointment resulted invalid. Will need to be recollected DOS. Order placed.

## 2023-11-04 NOTE — Progress Notes (Signed)
 Anesthesia Chart Review:  73 year old male follows with cardiology for history of PAD, HTN, HLD, atrial flutter/atrial fibrillation s/p ablation 2011, elevated coronary calcium  score 1483.  Nuclear stress test 07/2023 was low risk, negative for ischemia.  He has been maintained on dofetilide  for A-fib.  Last seen in the A-fib clinic by Valeda Garter, PA on 11/02/2023 and noted to be maintaining sinus rhythm.  Patient reported hematuria with Xarelto  that resolved with discontinuation and reoccurred after reinitiating.  He evidently had similar issues with this in the past.  Per note, "Patient noted blood in urine. Similar presentation 1-2 years ago, seen by urology and no bleeding source found. He will need to hold his Xarelto  for his upcoming vascular surgery with Dr [name] on 5/15. Will have him hold his Xarelto  for now and resume as instructed post surgery. If he has more hematuria, he will need referral back to urology."  Follows with vascular surgery for history of PAD s/p right common iliac artery stenting 2018.  Recently he underwent aortogram with angioplasty and stent of left common iliac artery via retrograde access, angioplasty and stent of the left external iliac artery via retrograde access, left common femoral endarterectomy including profundoplasty and endarterectomy the proximal SFA with bovine pericardial patch angioplasty on 09/02/2023 by Dr. 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  Last seen in follow-up on 10/27/2023, left leg much improved, however, he was noted to have heel ulceration and medial malleolus ulcer on the right as well as new foot pain.  Femoral endarterectomy with possible femoral to popliteal bypass was recommended.  Patient currently holding Xarelto  due to recent hematuria. He is taking 81 mg ASA as instructed by vascular surgery.  Other pertinent history includes current smoker, emphysema, ILD (thought due to limited systemic sclerosis;  declined therapy 2023; has albuterol  as needed), Raynaud's syndrome, latent TB (on INH x 3 months 2021, developed transaminitis).  Preop labs reviewed, unremarkable.  EKG 11/02/2023: Normal sinus rhythm.  Rate 72. Right bundle branch block  CT chest lung cancer screening 06/04/2021: MPRESSION: 1. Lung-RADS 2, benign appearance or behavior. Continue annual screening with low-dose chest CT without contrast in 12 months. 2. Peripheral basilar predominant pulmonary fibrosis, similar to 01/19/2020. Findings were categorized as probable usual interstitial pneumonitis on 07/28/2019. 3. Aortic atherosclerosis (ICD10-I70.0). Coronary artery calcification. 4.  Emphysema (ICD10-J43.9).  Nuclear stress test 07/24/23:   The study is normal. The study is low risk.   No ST deviation was noted.   LV perfusion is normal.   Left ventricular function is normal. Nuclear stress EF: 70%. The left ventricular ejection fraction is hyperdynamic (>65%). End diastolic cavity size is normal.   Prior study available for comparison from 10/10/2009. No changes compared to prior study.   Electronically signed by Luana Rumple, MD   Low risk stress nuclear study with normal perfusion and normal left ventricular regional and global systolic function.     CT Coronary Calcium  Scoring 07/07/23:  IMPRESSION: 1. Coronary calcium  score of 1483. This was 90th percentile for age-, race-, and sex-matched controls. 2.  Severe mitral annular calcifications. RECOMMENDATIONS:... Cardiology referral should be considered for patients with CAC scores >=400 or >=75th percentile... Dr. Katheryne Pane recommended proceeding with a Lexiscan  Myoview .     Echo 03/08/20: IMPRESSIONS   1. Left ventricular ejection fraction, by estimation, is 65 to 70%. The  left ventricle has hyperdynamic function. The left ventricle has no  regional wall motion abnormalities. There is mild left ventricular  hypertrophy.  Left ventricular diastolic  parameters  are consistent with Grade II diastolic dysfunction  (pseudonormalization).   2. Right ventricular systolic function is normal. The right ventricular  size is normal. There is normal pulmonary artery systolic pressure. The  estimated right ventricular systolic pressure is 26.4 mmHg.   3. Left atrial size was mildly dilated.   4. Chiari network noted. Right atrial size was mildly dilated.   5. The mitral valve is normal in structure. No evidence of mitral valve  regurgitation. No evidence of mitral stenosis. Moderate mitral annular  calcification.   6. The aortic valve is tricuspid. Aortic valve regurgitation is not  visualized. Mild aortic valve sclerosis is present, with no evidence of  aortic valve stenosis.   7. The inferior vena cava is normal in size with greater than 50%  respiratory variability, suggesting right atrial pressure of 3 mmHg.      Edilia Gordon [facility]/Anesthesiology Phone [phone] 11/04/2023 10:57 AM

## 2023-11-06 ENCOUNTER — Encounter (HOSPITAL_COMMUNITY): Payer: Self-pay

## 2023-11-09 ENCOUNTER — Inpatient Hospital Stay (HOSPITAL_COMMUNITY): Payer: Self-pay | Admitting: Physician Assistant

## 2023-11-09 ENCOUNTER — Other Ambulatory Visit: Payer: Self-pay

## 2023-11-09 ENCOUNTER — Encounter (HOSPITAL_COMMUNITY)
Admission: RE | Disposition: A | Discharge status: Home Health | Payer: Self-pay | Source: Home / Self Care | Attending: Vascular Surgery

## 2023-11-09 ENCOUNTER — Encounter (HOSPITAL_COMMUNITY): Payer: Self-pay | Admitting: Vascular Surgery

## 2023-11-09 ENCOUNTER — Inpatient Hospital Stay (HOSPITAL_COMMUNITY): Payer: Self-pay | Admitting: Anesthesiology

## 2023-11-09 ENCOUNTER — Inpatient Hospital Stay (HOSPITAL_COMMUNITY)
Admission: RE | Admit: 2023-11-09 | Discharge: 2023-11-11 | DRG: 254 | Disposition: A | Discharge status: Home Health | Attending: Vascular Surgery | Admitting: Vascular Surgery

## 2023-11-09 DIAGNOSIS — I70248 Atherosclerosis of native arteries of left leg with ulceration of other part of lower left leg: Secondary | ICD-10-CM | POA: Diagnosis not present

## 2023-11-09 DIAGNOSIS — M349 Systemic sclerosis, unspecified: Secondary | ICD-10-CM | POA: Diagnosis not present

## 2023-11-09 DIAGNOSIS — I70201 Unspecified atherosclerosis of native arteries of extremities, right leg: Secondary | ICD-10-CM | POA: Diagnosis not present

## 2023-11-09 DIAGNOSIS — L97829 Non-pressure chronic ulcer of other part of left lower leg with unspecified severity: Secondary | ICD-10-CM | POA: Diagnosis present

## 2023-11-09 DIAGNOSIS — I4891 Unspecified atrial fibrillation: Secondary | ICD-10-CM | POA: Diagnosis not present

## 2023-11-09 DIAGNOSIS — J449 Chronic obstructive pulmonary disease, unspecified: Secondary | ICD-10-CM | POA: Diagnosis not present

## 2023-11-09 DIAGNOSIS — Z7982 Long term (current) use of aspirin: Secondary | ICD-10-CM

## 2023-11-09 DIAGNOSIS — I70234 Atherosclerosis of native arteries of right leg with ulceration of heel and midfoot: Secondary | ICD-10-CM | POA: Diagnosis not present

## 2023-11-09 DIAGNOSIS — I998 Other disorder of circulatory system: Secondary | ICD-10-CM | POA: Diagnosis not present

## 2023-11-09 DIAGNOSIS — J439 Emphysema, unspecified: Secondary | ICD-10-CM | POA: Diagnosis present

## 2023-11-09 DIAGNOSIS — L97419 Non-pressure chronic ulcer of right heel and midfoot with unspecified severity: Secondary | ICD-10-CM | POA: Diagnosis present

## 2023-11-09 DIAGNOSIS — I70219 Atherosclerosis of native arteries of extremities with intermittent claudication, unspecified extremity: Secondary | ICD-10-CM | POA: Diagnosis present

## 2023-11-09 DIAGNOSIS — I2510 Atherosclerotic heart disease of native coronary artery without angina pectoris: Secondary | ICD-10-CM | POA: Diagnosis present

## 2023-11-09 DIAGNOSIS — L97319 Non-pressure chronic ulcer of right ankle with unspecified severity: Secondary | ICD-10-CM | POA: Diagnosis present

## 2023-11-09 DIAGNOSIS — F17210 Nicotine dependence, cigarettes, uncomplicated: Secondary | ICD-10-CM | POA: Diagnosis present

## 2023-11-09 DIAGNOSIS — E785 Hyperlipidemia, unspecified: Secondary | ICD-10-CM | POA: Diagnosis present

## 2023-11-09 DIAGNOSIS — Z7901 Long term (current) use of anticoagulants: Secondary | ICD-10-CM | POA: Diagnosis not present

## 2023-11-09 DIAGNOSIS — F418 Other specified anxiety disorders: Secondary | ICD-10-CM | POA: Diagnosis not present

## 2023-11-09 DIAGNOSIS — I70221 Atherosclerosis of native arteries of extremities with rest pain, right leg: Secondary | ICD-10-CM | POA: Diagnosis present

## 2023-11-09 DIAGNOSIS — I10 Essential (primary) hypertension: Secondary | ICD-10-CM | POA: Diagnosis not present

## 2023-11-09 DIAGNOSIS — I480 Paroxysmal atrial fibrillation: Secondary | ICD-10-CM | POA: Diagnosis present

## 2023-11-09 DIAGNOSIS — Z01818 Encounter for other preprocedural examination: Principal | ICD-10-CM

## 2023-11-09 HISTORY — PX: ENDARTERECTOMY FEMORAL: SHX5804

## 2023-11-09 HISTORY — PX: PATCH ANGIOPLASTY: SHX6230

## 2023-11-09 LAB — SURGICAL PCR SCREEN
MRSA, PCR: NEGATIVE
Staphylococcus aureus: NEGATIVE

## 2023-11-09 LAB — CBC
HCT: 33.4 % — ABNORMAL LOW (ref 39.0–52.0)
Hemoglobin: 10.9 g/dL — ABNORMAL LOW (ref 13.0–17.0)
MCH: 29.9 pg (ref 26.0–34.0)
MCHC: 32.6 g/dL (ref 30.0–36.0)
MCV: 91.5 fL (ref 80.0–100.0)
Platelets: 171 10*3/uL (ref 150–400)
RBC: 3.65 MIL/uL — ABNORMAL LOW (ref 4.22–5.81)
RDW: 13.9 % (ref 11.5–15.5)
WBC: 7.2 10*3/uL (ref 4.0–10.5)
nRBC: 0 % (ref 0.0–0.2)

## 2023-11-09 LAB — POCT ACTIVATED CLOTTING TIME
Activated Clotting Time: 256 s
Activated Clotting Time: 210 s

## 2023-11-09 LAB — CREATININE, SERUM
Creatinine, Ser: 0.83 mg/dL (ref 0.61–1.24)
GFR, Estimated: >60 mL/min (ref >60)

## 2023-11-09 SURGERY — ENDARTERECTOMY, FEMORAL
Anesthesia: General | Site: Groin | Laterality: Right

## 2023-11-09 MED ORDER — PROPOFOL 10 MG/ML IV BOLUS
INTRAVENOUS | Status: DC | PRN
  Administered 2023-11-09: 120 mg via INTRAVENOUS

## 2023-11-09 MED ORDER — CEFAZOLIN SODIUM-DEXTROSE 2-4 GM/100ML-% IV SOLN
2.0000 g | INTRAVENOUS | Status: AC
  Administered 2023-11-09: 2 g via INTRAVENOUS
  Filled 2023-11-09: qty 100

## 2023-11-09 MED ORDER — OXYCODONE HCL 5 MG PO TABS
5.0000 mg | ORAL_TABLET | ORAL | Status: DC | PRN
  Administered 2023-11-09: 5 mg via ORAL
  Administered 2023-11-09 – 2023-11-11 (×5): 10 mg via ORAL
  Filled 2023-11-09 (×2): qty 2
  Filled 2023-11-09: qty 1
  Filled 2023-11-09 (×3): qty 2

## 2023-11-09 MED ORDER — MIDAZOLAM HCL 2 MG/2ML IJ SOLN
INTRAMUSCULAR | Status: DC | PRN
  Administered 2023-11-09: 2 mg via INTRAVENOUS

## 2023-11-09 MED ORDER — DOCUSATE SODIUM 100 MG PO CAPS
100.0000 mg | ORAL_CAPSULE | Freq: Every day | ORAL | Status: DC
  Administered 2023-11-10 – 2023-11-11 (×2): 100 mg via ORAL
  Filled 2023-11-09 (×2): qty 1

## 2023-11-09 MED ORDER — MAGNESIUM SULFATE 2 GM/50ML IV SOLN: 2.0000 g | Freq: Every day | INTRAVENOUS | Status: DC | PRN

## 2023-11-09 MED ORDER — BISACODYL 5 MG PO TBEC: 5.0000 mg | DELAYED_RELEASE_TABLET | Freq: Every day | ORAL | Status: DC | PRN

## 2023-11-09 MED ORDER — METOPROLOL TARTRATE 5 MG/5ML IV SOLN: 2.0000 mg | INTRAVENOUS | Status: DC | PRN

## 2023-11-09 MED ORDER — FENTANYL CITRATE (PF) 100 MCG/2ML IJ SOLN
25.0000 ug | INTRAMUSCULAR | Status: DC | PRN
  Administered 2023-11-09 (×2): 50 ug via INTRAVENOUS

## 2023-11-09 MED ORDER — POTASSIUM CHLORIDE CRYS ER 20 MEQ PO TBCR: 20.0000 meq | EXTENDED_RELEASE_TABLET | Freq: Every day | ORAL | Status: DC | PRN

## 2023-11-09 MED ORDER — PROTAMINE SULFATE 10 MG/ML IV SOLN
INTRAVENOUS | Status: AC
  Filled 2023-11-09: qty 5

## 2023-11-09 MED ORDER — DEXAMETHASONE SODIUM PHOSPHATE 10 MG/ML IJ SOLN
INTRAMUSCULAR | Status: DC | PRN
  Administered 2023-11-09: 5 mg via INTRAVENOUS

## 2023-11-09 MED ORDER — CHLORHEXIDINE GLUCONATE 0.12 % MT SOLN
15.0000 mL | Freq: Once | OROMUCOSAL | Status: AC
  Administered 2023-11-09: 15 mL via OROMUCOSAL
  Filled 2023-11-09: qty 15

## 2023-11-09 MED ORDER — OXYCODONE HCL 5 MG/5ML PO SOLN: 5.0000 mg | Freq: Once | ORAL | Status: DC | PRN

## 2023-11-09 MED ORDER — ROCURONIUM BROMIDE 10 MG/ML (PF) SYRINGE
PREFILLED_SYRINGE | INTRAVENOUS | Status: AC
  Filled 2023-11-09: qty 10

## 2023-11-09 MED ORDER — ATORVASTATIN CALCIUM 80 MG PO TABS
80.0000 mg | ORAL_TABLET | Freq: Every evening | ORAL | Status: DC
  Administered 2023-11-09 – 2023-11-10 (×2): 80 mg via ORAL
  Filled 2023-11-09 (×2): qty 1

## 2023-11-09 MED ORDER — ASPIRIN 81 MG PO TBEC
81.0000 mg | DELAYED_RELEASE_TABLET | Freq: Every day | ORAL | Status: DC
Start: 2023-11-09 — End: 2023-11-09

## 2023-11-09 MED ORDER — CHOLECALCIFEROL 250 MCG (10000 UT) PO CAPS
10000.0000 [IU] | ORAL_CAPSULE | Freq: Every evening | ORAL | Status: DC
Start: 2023-11-09 — End: 2023-11-09

## 2023-11-09 MED ORDER — HEPARIN SODIUM (PORCINE) 5000 UNIT/ML IJ SOLN
5000.0000 [IU] | Freq: Three times a day (TID) | INTRAMUSCULAR | Status: DC
  Administered 2023-11-10 – 2023-11-11 (×4): 5000 [IU] via SUBCUTANEOUS
  Filled 2023-11-09 (×4): qty 1

## 2023-11-09 MED ORDER — LIDOCAINE 2% (20 MG/ML) 5 ML SYRINGE
INTRAMUSCULAR | Status: AC
  Filled 2023-11-09: qty 5

## 2023-11-09 MED ORDER — PROTAMINE SULFATE 10 MG/ML IV SOLN
INTRAVENOUS | Status: DC | PRN
  Administered 2023-11-09: 50 mg via INTRAVENOUS

## 2023-11-09 MED ORDER — HYDRALAZINE HCL 20 MG/ML IJ SOLN: 5.0000 mg | INTRAMUSCULAR | Status: DC | PRN

## 2023-11-09 MED ORDER — HEMOSTATIC AGENTS (NO CHARGE) OPTIME
TOPICAL | Status: DC | PRN
  Administered 2023-11-09: 1 via TOPICAL

## 2023-11-09 MED ORDER — PROPOFOL 10 MG/ML IV BOLUS
INTRAVENOUS | Status: AC
  Filled 2023-11-09: qty 20

## 2023-11-09 MED ORDER — PHENYLEPHRINE HCL-NACL 20-0.9 MG/250ML-% IV SOLN
INTRAVENOUS | Status: AC
  Filled 2023-11-09: qty 250

## 2023-11-09 MED ORDER — FENTANYL CITRATE (PF) 100 MCG/2ML IJ SOLN
INTRAMUSCULAR | Status: AC
  Filled 2023-11-09: qty 2

## 2023-11-09 MED ORDER — ONDANSETRON HCL 4 MG/2ML IJ SOLN: 4.0000 mg | Freq: Once | INTRAMUSCULAR | Status: DC | PRN

## 2023-11-09 MED ORDER — ORAL CARE MOUTH RINSE: 15.0000 mL | Freq: Once | OROMUCOSAL | Status: AC

## 2023-11-09 MED ORDER — OXYCODONE HCL 5 MG PO TABS: 5.0000 mg | ORAL_TABLET | Freq: Once | ORAL | Status: DC | PRN

## 2023-11-09 MED ORDER — GUAIFENESIN-DM 100-10 MG/5ML PO SYRP: 15.0000 mL | ORAL_SOLUTION | ORAL | Status: DC | PRN

## 2023-11-09 MED ORDER — DEXAMETHASONE SODIUM PHOSPHATE 10 MG/ML IJ SOLN
INTRAMUSCULAR | Status: AC
  Filled 2023-11-09: qty 1

## 2023-11-09 MED ORDER — HYDROMORPHONE HCL 1 MG/ML IJ SOLN
0.5000 mg | INTRAMUSCULAR | Status: DC | PRN
  Administered 2023-11-10: 0.5 mg via INTRAVENOUS
  Filled 2023-11-09 (×2): qty 0.5

## 2023-11-09 MED ORDER — PAPAVERINE HCL 30 MG/ML IJ SOLN
INTRAMUSCULAR | Status: AC
  Filled 2023-11-09: qty 2

## 2023-11-09 MED ORDER — ONDANSETRON HCL 4 MG/2ML IJ SOLN: 4.0000 mg | Freq: Four times a day (QID) | INTRAMUSCULAR | Status: DC | PRN

## 2023-11-09 MED ORDER — ALUM & MAG HYDROXIDE-SIMETH 200-200-20 MG/5ML PO SUSP: 15.0000 mL | ORAL | Status: DC | PRN

## 2023-11-09 MED ORDER — PHENYLEPHRINE HCL-NACL 20-0.9 MG/250ML-% IV SOLN
INTRAVENOUS | Status: DC | PRN
  Administered 2023-11-09: 75 ug/min via INTRAVENOUS

## 2023-11-09 MED ORDER — DOFETILIDE 500 MCG PO CAPS
500.0000 ug | ORAL_CAPSULE | Freq: Two times a day (BID) | ORAL | Status: DC
  Administered 2023-11-09 – 2023-11-11 (×5): 500 ug via ORAL
  Filled 2023-11-09 (×6): qty 1

## 2023-11-09 MED ORDER — ONDANSETRON HCL 4 MG/2ML IJ SOLN
INTRAMUSCULAR | Status: AC
  Filled 2023-11-09: qty 2

## 2023-11-09 MED ORDER — CHLORHEXIDINE GLUCONATE CLOTH 2 % EX PADS: 6.0000 | MEDICATED_PAD | Freq: Once | CUTANEOUS | Status: DC

## 2023-11-09 MED ORDER — ONDANSETRON HCL 4 MG/2ML IJ SOLN
INTRAMUSCULAR | Status: DC | PRN
  Administered 2023-11-09: 4 mg via INTRAVENOUS

## 2023-11-09 MED ORDER — LACTATED RINGERS IV SOLN: INTRAVENOUS | Status: DC

## 2023-11-09 MED ORDER — MIDAZOLAM HCL 2 MG/2ML IJ SOLN
INTRAMUSCULAR | Status: AC
  Filled 2023-11-09: qty 2

## 2023-11-09 MED ORDER — CHLORHEXIDINE GLUCONATE CLOTH 2 % EX PADS
6.0000 | MEDICATED_PAD | Freq: Once | CUTANEOUS | Status: DC
Start: 2023-11-09 — End: 2023-11-09

## 2023-11-09 MED ORDER — FENTANYL CITRATE (PF) 250 MCG/5ML IJ SOLN
INTRAMUSCULAR | Status: DC | PRN
  Administered 2023-11-09: 50 ug via INTRAVENOUS
  Administered 2023-11-09: 100 ug via INTRAVENOUS

## 2023-11-09 MED ORDER — LIDOCAINE 2% (20 MG/ML) 5 ML SYRINGE
INTRAMUSCULAR | Status: DC | PRN
  Administered 2023-11-09: 100 mg via INTRAVENOUS

## 2023-11-09 MED ORDER — ROCURONIUM BROMIDE 10 MG/ML (PF) SYRINGE
PREFILLED_SYRINGE | INTRAVENOUS | Status: DC | PRN
  Administered 2023-11-09: 10 mg via INTRAVENOUS
  Administered 2023-11-09: 70 mg via INTRAVENOUS

## 2023-11-09 MED ORDER — HEPARIN 6000 UNIT IRRIGATION SOLUTION
Status: DC | PRN
  Administered 2023-11-09: 1

## 2023-11-09 MED ORDER — HEPARIN 6000 UNIT IRRIGATION SOLUTION
Status: AC
  Filled 2023-11-09: qty 500

## 2023-11-09 MED ORDER — HEPARIN SODIUM (PORCINE) 1000 UNIT/ML IJ SOLN
INTRAMUSCULAR | Status: AC
  Filled 2023-11-09: qty 10

## 2023-11-09 MED ORDER — 0.9 % SODIUM CHLORIDE (POUR BTL) OPTIME
TOPICAL | Status: DC | PRN
  Administered 2023-11-09: 2000 mL

## 2023-11-09 MED ORDER — SENNOSIDES-DOCUSATE SODIUM 8.6-50 MG PO TABS: 1.0000 | ORAL_TABLET | Freq: Every evening | ORAL | Status: DC | PRN

## 2023-11-09 MED ORDER — PANTOPRAZOLE SODIUM 40 MG PO TBEC
40.0000 mg | DELAYED_RELEASE_TABLET | Freq: Every day | ORAL | Status: DC
  Administered 2023-11-09 – 2023-11-11 (×3): 40 mg via ORAL
  Filled 2023-11-09 (×4): qty 1

## 2023-11-09 MED ORDER — SODIUM CHLORIDE 0.9 % IV SOLN
500.0000 mL | Freq: Once | INTRAVENOUS | Status: AC | PRN
  Administered 2023-11-09: 500 mL via INTRAVENOUS

## 2023-11-09 MED ORDER — LABETALOL HCL 5 MG/ML IV SOLN: 10.0000 mg | INTRAVENOUS | Status: DC | PRN

## 2023-11-09 MED ORDER — ACETAMINOPHEN 325 MG PO TABS: 325.0000 mg | ORAL_TABLET | ORAL | Status: DC | PRN

## 2023-11-09 MED ORDER — FLEET ENEMA RE ENEM: 1.0000 | ENEMA | Freq: Once | RECTAL | Status: DC | PRN

## 2023-11-09 MED ORDER — SUGAMMADEX SODIUM 200 MG/2ML IV SOLN
INTRAVENOUS | Status: DC | PRN
  Administered 2023-11-09: 200 mg via INTRAVENOUS

## 2023-11-09 MED ORDER — MEPERIDINE HCL 25 MG/ML IJ SOLN: 6.2500 mg | INTRAMUSCULAR | Status: DC | PRN

## 2023-11-09 MED ORDER — CEFAZOLIN SODIUM-DEXTROSE 2-4 GM/100ML-% IV SOLN
2.0000 g | Freq: Three times a day (TID) | INTRAVENOUS | Status: AC
  Administered 2023-11-09 – 2023-11-10 (×2): 2 g via INTRAVENOUS
  Filled 2023-11-09 (×2): qty 100

## 2023-11-09 MED ORDER — GABAPENTIN 300 MG PO CAPS
300.0000 mg | ORAL_CAPSULE | Freq: Three times a day (TID) | ORAL | Status: DC
  Administered 2023-11-09 – 2023-11-11 (×6): 300 mg via ORAL
  Filled 2023-11-09 (×6): qty 1

## 2023-11-09 MED ORDER — PHENOL 1.4 % MT LIQD: 1.0000 | OROMUCOSAL | Status: DC | PRN

## 2023-11-09 MED ORDER — HEPARIN SODIUM (PORCINE) 1000 UNIT/ML IJ SOLN
INTRAMUSCULAR | Status: DC | PRN
  Administered 2023-11-09: 7000 [IU] via INTRAVENOUS

## 2023-11-09 MED ORDER — FENTANYL CITRATE (PF) 250 MCG/5ML IJ SOLN
INTRAMUSCULAR | Status: AC
  Filled 2023-11-09: qty 5

## 2023-11-09 MED ORDER — ACETAMINOPHEN 325 MG RE SUPP: 325.0000 mg | RECTAL | Status: DC | PRN

## 2023-11-09 MED ORDER — SODIUM CHLORIDE 0.9 % IV SOLN: INTRAVENOUS | Status: DC

## 2023-11-09 SURGICAL SUPPLY — 47 items
BAG COUNTER SPONGE SURGICOUNT (BAG) ×3 IMPLANT
BANDAGE ESMARK 6X9 LF (GAUZE/BANDAGES/DRESSINGS) IMPLANT
BLADE CLIPPER SURG (BLADE) ×3 IMPLANT
CANISTER SUCTION 3000ML PPV (SUCTIONS) ×3 IMPLANT
CLIP FOGARTY SPRING 6M (CLIP) ×3 IMPLANT
CLIP TI MEDIUM 24 (CLIP) ×3 IMPLANT
CLIP TI WIDE RED SMALL 24 (CLIP) ×3 IMPLANT
COVER PROBE W GEL 5X96 (DRAPES) ×3 IMPLANT
CUFF TOURN SGL QUICK 42 (TOURNIQUET CUFF) IMPLANT
CUFF TRNQT CYL 24X4X16.5-23 (TOURNIQUET CUFF) IMPLANT
CUFF TRNQT CYL 34X4.125X (TOURNIQUET CUFF) IMPLANT
DERMABOND ADVANCED .7 DNX12 (GAUZE/BANDAGES/DRESSINGS) ×3 IMPLANT
DRAPE C-ARM 42X72 X-RAY (DRAPES) ×3 IMPLANT
DRAPE HALF SHEET 40X57 (DRAPES) IMPLANT
ELECTRODE REM PT RTRN 9FT ADLT (ELECTROSURGICAL) ×3 IMPLANT
EVACUATOR SILICONE 100CC (DRAIN) IMPLANT
GLOVE BIO SURGEON STRL SZ7.5 (GLOVE) ×3 IMPLANT
GLOVE BIOGEL PI IND STRL 8 (GLOVE) ×3 IMPLANT
GOWN STRL REUS W/ TWL LRG LVL3 (GOWN DISPOSABLE) ×9 IMPLANT
GOWN STRL REUS W/ TWL XL LVL3 (GOWN DISPOSABLE) ×6 IMPLANT
GRAFT VASC PATCH XENOSURE 1X14 (Vascular Products) ×1 IMPLANT
HEMOSTAT SNOW SURGICEL 2X4 (HEMOSTASIS) ×1 IMPLANT
INSERT FOGARTY SM (MISCELLANEOUS) IMPLANT
KIT BASIN OR (CUSTOM PROCEDURE TRAY) ×3 IMPLANT
KIT TURNOVER KIT B (KITS) ×3 IMPLANT
LOOP VESSEL MINI RED (MISCELLANEOUS) ×2 IMPLANT
NS IRRIG 1000ML POUR BTL (IV SOLUTION) ×6 IMPLANT
PACK PERIPHERAL VASCULAR (CUSTOM PROCEDURE TRAY) ×3 IMPLANT
PAD ARMBOARD POSITIONER FOAM (MISCELLANEOUS) ×6 IMPLANT
SET MICROPUNCTURE 5F STIFF (MISCELLANEOUS) IMPLANT
STOPCOCK 4 WAY LG BORE MALE ST (IV SETS) IMPLANT
SUT ETHILON 3 0 PS 1 (SUTURE) IMPLANT
SUT GORETEX 5 0 TT13 24 (SUTURE) IMPLANT
SUT MNCRL AB 4-0 PS2 18 (SUTURE) ×9 IMPLANT
SUT PROLENE 5 0 C 1 24 (SUTURE) ×3 IMPLANT
SUT PROLENE 6 0 BV (SUTURE) ×4 IMPLANT
SUT PROLENE 7 0 BV 1 (SUTURE) IMPLANT
SUT SILK 2 0 PERMA HAND 18 BK (SUTURE) IMPLANT
SUT SILK 3-0 18XBRD TIE 12 (SUTURE) IMPLANT
SUT VIC AB 2-0 CT1 TAPERPNT 27 (SUTURE) ×6 IMPLANT
SUT VIC AB 3-0 SH 27X BRD (SUTURE) ×9 IMPLANT
TAPE UMBILICAL 1/8X30 (MISCELLANEOUS) IMPLANT
TOWEL GREEN STERILE (TOWEL DISPOSABLE) ×3 IMPLANT
TRAY FOLEY MTR SLVR 16FR STAT (SET/KITS/TRAYS/PACK) ×3 IMPLANT
TUBING EXTENTION W/L.L. (IV SETS) IMPLANT
UNDERPAD 30X36 HEAVY ABSORB (UNDERPADS AND DIAPERS) ×3 IMPLANT
WATER STERILE IRR 1000ML POUR (IV SOLUTION) ×3 IMPLANT

## 2023-11-09 NOTE — Anesthesia Procedure Notes (Signed)
 Arterial Line Insertion Start/End5/05/2024 7:15 AM, 11/09/2023 7:20 AM Performed by: Fayetta Hoose, CRNA, CRNA  Patient location: Pre-op. Preanesthetic checklist: patient identified, IV checked, site marked, risks and benefits discussed, surgical consent, monitors and equipment checked, pre-op evaluation, timeout performed and anesthesia consent Lidocaine  1% used for infiltration radial was placed Catheter size: 20 G Hand hygiene performed  and maximum sterile barriers used   Attempts: 1 Procedure performed without using ultrasound guided technique. Following insertion, dressing applied. Post procedure assessment: normal and unchanged  Patient tolerated the procedure well with no immediate complications.

## 2023-11-09 NOTE — H&P (Signed)
 History and Physical Interval Note:  11/09/2023 7:47 AM  Bradley Ferguson  has presented today for surgery, with the diagnosis of CLI RLE.  The various methods of treatment have been discussed with the patient and family. After consideration of risks, benefits and other options for treatment, the patient has consented to  Procedure(s): ENDARTERECTOMY, FEMORAL (Right) BYPASS GRAFT FEMORAL-POPLITEAL ARTERY (Right) as a surgical intervention.  The patient's history has been reviewed, patient examined, no change in status, stable for surgery.  I have reviewed the patient's chart and labs.  Questions were answered to the patient's satisfaction.    Plan right femoral endarterectomy with possible right femoral-popliteal bypass for tissue loss.  His left lower extremity tissue loss has healed nicely since femoral endarterectomy.  Discussed we will make a intraoperative assessment based on the quality of the vein.  Bradley Ferguson   HISTORY AND PHYSICAL        CC:  follow up. Requesting Provider:  [name], MD   HPI: This is a 73 y.o. male who is here today for follow up for PAD.  Pt has hx of atrial fibrillation on Xarelto , tobacco abuse, hyperlipidemia that presents for evaluation of right femoral endarterectomy and left femoral endarterectomy with retrograde stenting for disabling claudication. Patient has been under the care of Dr. Katheryne Pane with cardiology. He does have a history of right common iliac artery stenting in 2018 by Dr. Katheryne Pane. Most recently underwent angiogram on 07/09/2023 showing a left common iliac stenosis of 90% with significant bilateral common femoral disease. Also known to have bilateral SFA occlusions. Patient had ABIs on 02/23/2023 that were 0.55 on the right and 0.39 on the left.    He underwent aortogram with angioplasty and stent of left common iliac artery via retrograde access, angioplasty and stent of the left external iliac artery via retrograde access, left common  femoral endarterectomy including profundoplasty and endarterectomy the proximal SFA with bovine pericardial patch angioplasty on 09/02/2023 by Dr. 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      The pt returns today for follow up.  He states that his left leg is doing well and his ulcer is getting better.  He states that he is having more pain in the right foot and has an ulceration on the right heel and medial ankle.  He states he gets a stabbing pain in the foot around the ankle.  He is compliant with his plavix  and xarelto  and statin.  He is still smoking but has cut back considerably.  He states he has 1-2 in the morning with his coffee but has pretty much cut out the rest.     The pt is on a statin for cholesterol management.    The pt is not on an aspirin .    Other AC:  Plavix /Xarelto  The pt is not on medication for hypertension.  The pt is not on medication for diabetes. Tobacco hx:  current       . PHYSICAL EXAMINATION:       Today's Vitals    10/27/23 0943 10/27/23 0949  BP: 139/81    Pulse: 100    Resp: 18    Temp: 98.2 F (36.8 C)    TempSrc: Temporal    SpO2: 95%    Weight: 178 lb (80.7 kg)    Height: 6\' 1"  (1.854 m)    PainSc: 0-No pain 8     Body mass index is 23.48 kg/m.  General:  WDWN in NAD; vital signs documented above Gait: Not observed HENT: WNL, normocephalic Pulmonary: normal non-labored breathing , without wheezing Cardiac: regular HR Skin: some raw skin on the right heel where he has placed tape to cover ulcer Vascular Exam/Pulses: Palpable femoral pulses bilaterally; right is 1+ Faint monophasic right DP doppler signal Brisk monophasic left DP/PT doppler flow Extremities:    Right foot    Right medial foot    Left heel          Non-Invasive Vascular Imaging:   ABI's/TBI's on 10/13/2023: Right:  0.28/0 - Great toe pressure: 0 Left:  0.64/0.30 - Great toe pressure: 36   Arterial duplex on  10/13/2023: Abdominal Aorta Findings:  +-------------+-------+----------+----------+----------+--------+--------+  Location    AP (cm)Trans (cm)PSV (cm/s)Waveform  ThrombusComments  +-------------+-------+----------+----------+----------+--------+--------+  Proximal    2.00   2.00      74                                    +-------------+-------+----------+----------+----------+--------+--------+  Mid         3.20   3.20      28                          fusiform  +-------------+-------+----------+----------+----------+--------+--------+  Distal      2.30   2.30      17                                    +-------------+-------+----------+----------+----------+--------+--------+  RT EIA Prox                   121       monophasic                  +-------------+-------+----------+----------+----------+--------+--------+  RT EIA Mid                    89        monophasic                  +-------------+-------+----------+----------+----------+--------+--------+  RT EIA Distal                 40        monophasic                  +-------------+-------+----------+----------+----------+--------+--------+    IVC/Iliac Findings:  +--------+------+--------+--------+   IVC   PatentThrombusComments  +--------+------+--------+--------+  IVC Proxpatent                  +--------+------+--------+--------+   Right Stent(s):  +-------------------+--------+--------+--------+--------+  Common Iliac ArteryPSV cm/sStenosisWaveformComments  +-------------------+--------+--------+--------+--------+  Prox to Stent      57                                +-------------------+--------+--------+--------+--------+  Proximal Stent     68                                +-------------------+--------+--------+--------+--------+  Mid Stent          42                                 +-------------------+--------+--------+--------+--------+  Distal Stent       46                                +-------------------+--------+--------+--------+--------+  Distal to Stent    36                                +-------------------+--------+--------+--------+--------+   Left Stent(s):  +-------------------+--------+--------+----------+--------+  Common Iliac ArteryPSV cm/sStenosisWaveform  Comments  +-------------------+--------+--------+----------+--------+  Prox to Stent      63              monophasic          +-------------------+--------+--------+----------+--------+  Proximal Stent     74              monophasic          +-------------------+--------+--------+----------+--------+  Mid Stent          76              monophasic          +-------------------+--------+--------+----------+--------+  Distal Stent       71              monophasic          +-------------------+--------+--------+----------+--------+  Distal to Stent    70              monophasic          +-------------------+--------+--------+----------+--------+   +---------------------+--------+--------+----------+--------+  External Iliac ArteryPSV cm/sStenosisWaveform  Comments  +---------------------+--------+--------+----------+--------+  Prox to Stent        48              monophasic          +---------------------+--------+--------+----------+--------+  Proximal Stent       68              monophasic          +---------------------+--------+--------+----------+--------+  Mid Stent            76              monophasic          +---------------------+--------+--------+----------+--------+  Distal Stent         93              monophasic          +---------------------+--------+--------+----------+--------+  Distal to Stent      112             monophasic           +---------------------+--------+--------+----------+--------+   Summary:  Abdominal Aorta: There is evidence of abnormal dilatation of the mid  Abdominal aorta. Severe ectasia throught out the abdominal aorta and  moderate calcific plaque noted. Severe monophasic waveform pattern in the bilateral iliac arteries and dampened flow   may suggest distal abdominal aortic stenosis. The largest aortic diameter remains essentially unchanged compared to prior exam. Previous diameter measurement was 3.0 cm obtained on 02/23/2023.   Stenosis: +--------------------+-----------+-----------+  Location            Stent      Comments     +--------------------+-----------+-----------+  Right Common Iliac  no stenosis             +--------------------+-----------+-----------+  Left Common Iliac   no stenosis             +--------------------+-----------+-----------+  Right External  Iliac           no stenosis  +--------------------+-----------+-----------+  Left External Iliac no stenosis             +--------------------+-----------+-----------+      IVC/Iliac: Patent IVC.    Previous ABI's/TBI's on 02/23/2023: Right:  0.55/0 - Great toe pressure: 0 Left:  0.38/0 - Great toe pressure:  0       ASSESSMENT/PLAN:: 73 y.o. male here for follow up for PAD with hx of  angioplasty and stent of left common iliac artery via retrograde access, angioplasty and stent of the left external iliac artery via retrograde access, left common femoral endarterectomy including profundoplasty and endarterectomy the proximal SFA with bovine pericardial patch angioplasty on 09/02/2023 by Dr. 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      -pt left leg much improved and ulceration improving.  His ABI did improve as well as toe pressure. Iliac stent is patent.  -on the right leg, he does have a heel ulceration and medial malleolus ulcer.  He is having a new pain in the  foot.  He has toe pressure of zero and decreased ABI to 0.28.   -reviewed pt with Dr. [name] and he saw pt and discussed that he will need a femoral endarterectomy with possible femoral to popliteal bypass in the next couple of weeks.   -continue statin and Xarelto .  Ok to stop the Plavix  and resume baby asa.  -discussed importance of smoking cessation and the risk of limb loss, heart attack and stroke as well as cancers.  He has cut back considerably.  Encouraged him to continue to work on this.   -he will need to be off his Xarelto  for 2-3 days prior to surgery.      Maryanna Smart, [facility] [phone]   Clinic MD:   [name]

## 2023-11-09 NOTE — Anesthesia Procedure Notes (Signed)
 Procedure Name: Intubation Date/Time: 11/09/2023 9:11 AM  Performed by: Daved Mcfann A, CRNAPre-anesthesia Checklist: Patient identified, Emergency Drugs available, Suction available and Patient being monitored Patient Re-evaluated:Patient Re-evaluated prior to induction Oxygen Delivery Method: Circle System Utilized Preoxygenation: Pre-oxygenation with 100% oxygen Induction Type: IV induction Ventilation: Mask ventilation without difficulty Laryngoscope Size: Mac and 4 Grade View: Grade II Tube type: Oral Tube size: 7.5 mm Number of attempts: 1 Airway Equipment and Method: Stylet and Oral airway Placement Confirmation: ETT inserted through vocal cords under direct vision, positive ETCO2 and breath sounds checked- equal and bilateral Secured at: 23 cm Tube secured with: Tape Dental Injury: Teeth and Oropharynx as per pre-operative assessment

## 2023-11-09 NOTE — Op Note (Signed)
 Date: Nov 09, 2023  Preoperative diagnosis: Critical limb ischemia right lower extremity  Postoperative diagnosis: Same  Procedure:  Right common femoral endarterectomy including profundoplasty and endarterectomy of the proximal SFA with bovine pericardial patch angioplasty  Surgeon: Dr. [name], MD  Assistant: Dr. Bernadette Brewster, MD and Wynonia Hedges, [place]  Indications: 73 year old male seen with critical limb ischemia of the right lower extremity with an Achilles wound from rubbing his shoe.  He does have evidence of a high-grade right common femoral artery stenosis with proximal SFA occlusion.  He presents for common femoral endarterectomy and possible bypass.  An assistant was needed given the complexity of the case and also for the endarterectomy and patch angioplasty.  Findings: There was no usable vein in the right lower extremity.  Ultimately elected to do endarterectomy of the common femoral artery with profundoplasty and also endarterectomy of the proximal SFA.  A bovine pericardial patch was sewn.  We had brisk signals in the foot at completion.  I elected not to do a bypass as this would require plastic with below the knee target with poor durability.  Anesthesia: General  Details: Patient was taken to the operating room after informed consent was obtained.  Placed on operative table in the supine position.  General endotracheal anesthesia was induced.  I then used ultrasound to evaluate the great saphenous vein in the right leg and this was very small in both the thigh and calf.  Ultimately the right groin and right leg were prepped and draped in standard sterile fashion.  Antibiotics were given and timeout performed.  Then made a transverse incision in the right groin above the inguinal crease.  Dissected down Bovie cautery and used cerebellar retractors.  Femoral sheath was opened in a longitudinal fashion.  The SFA and profunda were dissected out as well as the common  femoral and distal external iliac and all these were controlled with Vesseloops.  I then gave the patient 100 units/kg IV heparin .  ACT was checked to maintain greater than 250.  We then clamped the profunda with a profunda clamp.  I then controlled the distal external iliac with a Henley clamp.  Opened the right common femoral artery with 11 blade scalpel and Potts scissors.  Dr. Rosalva Comber did extend the arteriotomy down onto the profunda where the vessel looked healthy and was patent with brisk backbleeding.  Then used a Runner, broadcasting/film/video and performed extensive endarterectomy of the common femoral up into the distal external iliac.  I then carried the endarterectomy into the proximal profunda and eversion endarterectomy of the proximal SFA.  Ultimately we were satisfied with our endpoint on the profunda.  The patch was then brought on the field using a long bovine pericardial patch.  This was sewn in place with 5-0 Prolene parachute technique with the help of Dr. Rosalva Comber and we carried the patch onto the proximal profunda.  We de-aired this prior to completion.  I did put several repair sutures in the patch with 5-0 Prolene.  We had a great femoral pulse.  We had brisk triphasic profunda signal.  There was excellent flow in the SFA although this sounded like there was some distal stenosis with retrograde flow.  We had a brisk Doppler signal in the right foot.  Protamine  was given.  We irrigated out the right groin and used Surgicel snow.  My PA Wynonia Hedges then closed the groin using multiple layers of 2-0 Vicryl, 3-0 Vicryl, 4-0 Monocryl and Dermabond.  Taken to recovery in stable condition.  Complication: None  Condition: Stable  [name], MD [facility] [place] Office: [phone]   [name]

## 2023-11-09 NOTE — Progress Notes (Signed)
 Patient arrived at the unit,CHG bath given,vitals taken,CCMD notified ,rt groin incision level 0,dorsalis pedis pulse dopplered,pt oriented to the unit,call bell in reach

## 2023-11-09 NOTE — Anesthesia Postprocedure Evaluation (Signed)
 Anesthesia Post Note  Patient: Bradley Ferguson  Procedure(s) Performed: ENDARTERECTOMY, FEMORAL TO PROFUNDA (Right: Groin) ANGIOPLASTY, USING PATCH GRAFT, USING XENOSURE 1X14CM (Right: Groin)     Patient location during evaluation: PACU Anesthesia Type: General Level of consciousness: awake and alert Pain management: pain level controlled Vital Signs Assessment: post-procedure vital signs reviewed and stable Respiratory status: spontaneous breathing, nonlabored ventilation, respiratory function stable and patient connected to nasal cannula oxygen Cardiovascular status: blood pressure returned to baseline and stable Postop Assessment: no apparent nausea or vomiting Anesthetic complications: no   No notable events documented.  Last Vitals:  Vitals:   11/09/23 1209 11/09/23 1215  BP:  111/71  Pulse: 69 60  Resp: 13 15  Temp:    SpO2: 96% 92%    Last Pain:  Vitals:   11/09/23 1215  TempSrc:   PainSc: Asleep                 Dhanvi Boesen

## 2023-11-09 NOTE — Transfer of Care (Signed)
 Immediate Anesthesia Transfer of Care Note  Patient: [name]  Procedure(s) Performed: ENDARTERECTOMY, FEMORAL TO PROFUNDA (Right: Groin) ANGIOPLASTY, USING PATCH GRAFT, USING XENOSURE 1X14CM (Right: Groin)  Patient Location: PACU  Anesthesia Type:General  Level of Consciousness: awake, alert , and oriented  Airway & Oxygen Therapy: Patient Spontanous Breathing and Patient connected to nasal cannula oxygen  Post-op Assessment: Report given to RN and Post -op Vital signs reviewed and stable  Post vital signs: Reviewed and stable  Last Vitals:  Vitals Value Taken Time  BP 126/86 11/09/23 1130  Temp    Pulse 73 11/09/23 1133  Resp 14 11/09/23 1133  SpO2 99 % 11/09/23 1133  Vitals shown include unfiled device data.  Last Pain:  Vitals:   11/09/23 0647  TempSrc:   PainSc: 0-No pain         Complications: No notable events documented.

## 2023-11-10 ENCOUNTER — Encounter (HOSPITAL_COMMUNITY): Payer: Self-pay

## 2023-11-10 ENCOUNTER — Encounter (HOSPITAL_COMMUNITY): Payer: Self-pay | Admitting: Vascular Surgery

## 2023-11-10 ENCOUNTER — Inpatient Hospital Stay (HOSPITAL_COMMUNITY): Admission: RE | Admit: 2023-11-10 | Source: Ambulatory Visit

## 2023-11-10 ENCOUNTER — Encounter (HOSPITAL_COMMUNITY)

## 2023-11-10 LAB — BASIC METABOLIC PANEL WITH GFR
Anion gap: 9 (ref 5–15)
BUN: 17 mg/dL (ref 8–23)
CO2: 23 mmol/L (ref 22–32)
Calcium: 8.4 mg/dL — ABNORMAL LOW (ref 8.9–10.3)
Chloride: 104 mmol/L (ref 98–111)
Creatinine, Ser: 1.04 mg/dL (ref 0.61–1.24)
GFR, Estimated: >60 mL/min (ref >60)
Glucose, Bld: 161 mg/dL — ABNORMAL HIGH (ref 70–99)
Potassium: 4.5 mmol/L (ref 3.5–5.1)
Sodium: 136 mmol/L (ref 135–145)

## 2023-11-10 LAB — LIPID PANEL
Cholesterol: 105 mg/dL (ref 0–200)
HDL: 36 mg/dL — ABNORMAL LOW (ref >40)
LDL Cholesterol: 52 mg/dL (ref 0–99)
Total CHOL/HDL Ratio: 2.9 ratio
Triglycerides: 85 mg/dL (ref <150)
VLDL: 17 mg/dL (ref 0–40)

## 2023-11-10 LAB — CBC
HCT: 30 % — ABNORMAL LOW (ref 39.0–52.0)
Hemoglobin: 9.9 g/dL — ABNORMAL LOW (ref 13.0–17.0)
MCH: 29.5 pg (ref 26.0–34.0)
MCHC: 33 g/dL (ref 30.0–36.0)
MCV: 89.3 fL (ref 80.0–100.0)
Platelets: 160 10*3/uL (ref 150–400)
RBC: 3.36 MIL/uL — ABNORMAL LOW (ref 4.22–5.81)
RDW: 14 % (ref 11.5–15.5)
WBC: 10.8 10*3/uL — ABNORMAL HIGH (ref 4.0–10.5)
nRBC: 0 % (ref 0.0–0.2)

## 2023-11-10 MED ORDER — ASPIRIN 81 MG PO TBEC
81.0000 mg | DELAYED_RELEASE_TABLET | Freq: Every day | ORAL | Status: DC
  Administered 2023-11-10 – 2023-11-11 (×2): 81 mg via ORAL
  Filled 2023-11-10 (×2): qty 1

## 2023-11-10 NOTE — Progress Notes (Signed)
 PHARMACIST LIPID MONITORING   Bradley Ferguson is a 73 y.o. male admitted on 11/09/2023 with PAD .  Pharmacy has been consulted to optimize lipid-lowering therapy with the indication of secondary prevention for clinical ASCVD.  Recent Labs:  Lipid Panel (last 6 months):   Lab Results  Component Value Date   CHOL 105 11/10/2023   TRIG 85 11/10/2023   HDL 36 (L) 11/10/2023   CHOLHDL 2.9 11/10/2023   VLDL 17 11/10/2023   LDLCALC 52 11/10/2023    Hepatic function panel (last 6 months):   Lab Results  Component Value Date   AST 21 11/03/2023   ALT 14 11/03/2023   ALKPHOS 76 11/03/2023   BILITOT 0.8 11/03/2023    SCr (since admission):   Serum creatinine: 1.04 mg/dL 16/10/96 0454 Estimated creatinine clearance: 70 mL/min  Current therapy and lipid therapy tolerance Current lipid-lowering therapy: lipitor  80 Previous lipid-lowering therapies (if applicable): n/a Documented or reported allergies or intolerances to lipid-lowering therapies (if applicable): n/a  Assessment:   Patient prefers no changes in lipid-lowering therapy at this time due to at goal  Plan:    1.Statin intensity (high intensity recommended for all patients regardless of the LDL):  No statin changes. The patient is already on a high intensity statin.  2.Add ezetimibe (if any one of the following):   Not indicated at this time.  3.Refer to lipid clinic:   No  4.Follow-up with:  Primary care provider - [name], MD  5.Follow-up labs after discharge:  No changes in lipid therapy, repeat a lipid panel in one year.       Bradley Ferguson, PharmD 11/10/2023, 7:32 AM

## 2023-11-10 NOTE — Discharge Instructions (Signed)
[facility]  Discharge instructions  Lower Extremity Vascular Surgery  Please refer to the following instruction for your post-procedure care. Your surgeon or physician assistant will discuss any changes with you.  Activity  You are encouraged to walk as much as you can. You can slowly return to normal activities during the month after your surgery. Avoid strenuous activity and heavy lifting until your doctor tells you it's OK. Avoid activities such as vacuuming or swinging a golf club. Do not drive until your doctor give the OK and you are no longer taking prescription pain medications. It is also normal to have difficulty with sleep habits, eating and bowel movement after surgery. These will go away with time.  Bathing/Showering  Shower daily after you go home. Do not soak in a bathtub, hot tub, or swim until the incision heals completely.  Incision Care  Clean your incision with mild soap and water. Shower every day. Pat the area dry with a clean towel. You do not need a bandage unless otherwise instructed. Do not apply any ointments or creams to your incision. If you have open wounds you will be instructed how to care for them or a visiting nurse may be arranged for you. If you have staples or sutures along your incision they will be removed at your post-op appointment. You may have skin glue on your incision. Do not peel it off. It will come off on its own in about one week.  Wash the groin wound with soap and water daily and pat dry. (No tub bath-only shower)  Then put a dry gauze or washcloth in the groin to keep this area dry to help prevent wound infection.  Do this daily and as needed.  Do not use Vaseline or neosporin on your incisions.  Only use soap and water on your incisions and then protect and keep dry.  Diet  Resume your normal diet. There are no special food restrictions following this procedure. A low fat/ low cholesterol diet is  recommended for all patients with vascular disease. In order to heal from your surgery, it is CRITICAL to get adequate nutrition. Your body requires vitamins, minerals, and protein. Vegetables are the best source of vitamins and minerals. Vegetables also provide the perfect balance of protein. Processed food has little nutritional value, so try to avoid this.  Medications  Resume taking all your medications unless your doctor or physician assistant tells you not to. If your incision is causing pain, you may take over-the-counter pain relievers such as acetaminophen (Tylenol). If you were prescribed a stronger pain medication, please aware these medication can cause nausea and constipation. Prevent nausea by taking the medication with a snack or meal. Avoid constipation by drinking plenty of fluids and eating foods with high amount of fiber, such as fruits, vegetables, and grains. Take Colace 100 mg (an over-the-counter stool softener) twice a day as needed for constipation.  Do not take Tylenol if you are taking prescription pain medications.  Follow Up  Our office will schedule a follow up appointment 2-3 weeks following discharge.  Please call us immediately for any of the following conditions  .Severe or worsening pain in your legs or feet while at rest or while walking .Increase pain, redness, warmth, or drainage (pus) from your incision site(s) . Fever of 101 degree or higher . The swelling in your leg with the bypass suddenly worsens and becomes more painful than when you were in the hospital .  If you have been instructed to feel your graft pulse then you should do so every day. If you can no longer feel this pulse, call the office immediately. Not all patients are given this instruction. .  Leg swelling is common after leg bypass surgery.  The swelling should improve over a few months following surgery. To improve the swelling, you may elevate your legs above the level of your heart while  you are sitting or resting. Your surgeon or physician assistant may ask you to apply an ACE wrap or wear compression (TED) stockings to help to reduce swelling.  Reduce your risk of vascular disease  Stop smoking. If you would like help call [facility] at 1-800-QUIT-NOW 570-542-2068) or [place] at [phone].  . Manage your cholesterol . Maintain a desired weight . Control your diabetes weight . Control your diabetes . Keep your blood pressure down .  If you have any questions, please call the office at [phone]

## 2023-11-10 NOTE — Evaluation (Signed)
 Occupational Therapy Evaluation Patient Details Name: Bradley Ferguson MRN: [id number] DOB: Sep 04, 1950 Today's Date: 11/10/2023   History of Present Illness   Pt is a 73yo male admitted on 5/12 for R femoral endarterectomy and L femoral endarterectomy with retrograde stenting.  PMH:A-fib on Xarelto , tobacco abuse, HLD, s/p R common iliac artery stenting in 2018. S/p L common femoral endarterectomy on 09/02/23.     Clinical Impressions Pr presents with decline in function and safety with ADLs and ADL mobility with impaired balance and endurance, poor safety awareness, impulsive during ADL mobility. PTA pt renting a room in a home and has roommates who can assist in providing transportation and with groceries/meals, pt was Ind with ADLs/selfcare but reports "doing the best I could" due to difficulty standing for long periods of time to cook, shower, standing ADLs due to onset of sharp pain in B LEs, was using a cane but recently got a rollator but reports not being able to access all areas of home due to rollator being to wide. Pt would benefit from acute OT services to address impairments to maximize level of function and safety     If plan is discharge home, recommend the following:   A little help with bathing/dressing/bathroom;A little help with walking and/or transfers;Assistance with cooking/housework;Assist for transportation;Help with stairs or ramp for entrance     Functional Status Assessment   Patient has had a recent decline in their functional status and demonstrates the ability to make significant improvements in function in a reasonable and predictable amount of time.     Equipment Recommendations   BSC/3in1;Tub/shower bench;Toilet riser     Recommendations for Other Services         Precautions/Restrictions   Precautions Precautions: Fall Restrictions Weight Bearing Restrictions Per Provider Order: No     Mobility Bed Mobility               General  bed mobility comments: pt in chair    Transfers Overall transfer level: Needs assistance Equipment used: Rolling walker (2 wheels) Transfers: Sit to/from Stand Sit to Stand: Contact guard assist           General transfer comment: verbal cues for hand placement, ditching RW at sink to walk back to chair      Balance Overall balance assessment: Needs assistance Sitting-balance support: Feet supported, No upper extremity supported Sitting balance-Leahy Scale: Fair     Standing balance support: No upper extremity supported, During functional activity Standing balance-Leahy Scale: Fair                             ADL either performed or assessed with clinical judgement   ADL Overall ADL's : Needs assistance/impaired Eating/Feeding: Independent   Grooming: Wash/dry hands;Wash/dry face;Brushing hair;Contact guard assist;Standing;Cueing for safety   Upper Body Bathing: Set up   Lower Body Bathing: Contact guard assist;Cueing for safety   Upper Body Dressing : Set up   Lower Body Dressing: Contact guard assist;Cueing for safety   Toilet Transfer: Contact guard assist;Ambulation;Rolling walker (2 wheels);Regular Toilet;BSC/3in1;Grab bars;Cueing for safety       Tub/ Shower Transfer: Contact guard assist;Ambulation;Rolling walker (2 wheels);BSC/3in1;Grab bars   Functional mobility during ADLs: Contact guard assist;Rolling walker (2 wheels);Cueing for safety General ADL Comments: Pt reports difficulty with LB ADLs prior due to pain in LEs     Vision Ability to See in Adequate Light: 0 Adequate Patient Visual Report: No change from baseline  Perception         Praxis         Pertinent Vitals/Pain Pain Assessment Pain Assessment: 0-10 Pain Score: 4  Pain Location: R groin Pain Descriptors / Indicators: Sharp Pain Intervention(s): Premedicated before session, Monitored during session, Repositioned     Extremity/Trunk Assessment Upper  Extremity Assessment Upper Extremity Assessment: Overall WFL for tasks assessed   Lower Extremity Assessment Lower Extremity Assessment: Defer to PT evaluation   Cervical / Trunk Assessment Cervical / Trunk Assessment: Normal   Communication Communication Communication: No apparent difficulties   Cognition Arousal: Alert Behavior During Therapy: Impulsive                                 Following commands: Intact       Cueing  General Comments   Cueing Techniques: Verbal cues      Exercises     Shoulder Instructions      Home Living Family/patient expects to be discharged to:: Private residence Living Arrangements: Non-relatives/Friends Available Help at Discharge: Available PRN/intermittently Type of Home: House Home Access: Stairs to enter Secretary/administrator of Steps: 3 Entrance Stairs-Rails: Left Home Layout: One level;Laundry or work area in [employer] Shower/Tub: Tub/shower unit;Door   [employer]: Standard     Home Equipment: Rollator (4 wheels)          Prior Functioning/Environment Prior Level of Function : Independent/Modified Independent;Driving             Mobility Comments: was given a rollator last admission, reports he cant bring it into bathroom, desires a cane - instructed pt to purchase one on his own ADLs Comments: "doing the best I could", pt difficulty standing for long periods of time to cook, shower, standing ADLs due to onset of sharp pain in bilat LEs L > R    OT Problem List: Impaired balance (sitting and/or standing);Decreased activity tolerance;Decreased knowledge of use of DME or AE;Decreased safety awareness;Pain   OT Treatment/Interventions: Self-care/ADL training;Therapeutic exercise;Patient/family education;Therapeutic activities;DME and/or AE instruction;Balance training      OT Goals(Current goals can be found in the care plan section)   Acute Rehab OT Goals Patient Stated Goal:  go home OT Goal Formulation: With patient Time For Goal Achievement: 11/24/23 Potential to Achieve Goals: Good ADL Goals Pt Will Perform Grooming: with supervision;with set-up;standing Pt Will Perform Lower Body Bathing: with supervision;with modified independence;with adaptive equipment Pt Will Perform Lower Body Dressing: with supervision;with modified independence;with adaptive equipment Pt Will Transfer to Toilet: with supervision;with modified independence;ambulating Pt Will Perform Toileting - Clothing Manipulation and hygiene: with supervision;with modified independence;sit to/from stand Pt Will Perform Tub/Shower Transfer: with supervision;with modified independence;ambulating;rolling walker;grab bars   OT Frequency:  Min 2X/week    Co-evaluation              AM-PAC OT "6 Clicks" Daily Activity     Outcome Measure Help from another person eating meals?: None Help from another person taking care of personal grooming?: A Little Help from another person toileting, which includes using toliet, bedpan, or urinal?: A Little Help from another person bathing (including washing, rinsing, drying)?: A Little Help from another person to put on and taking off regular upper body clothing?: A Little Help from another person to put on and taking off regular lower body clothing?: A Little 6 Click Score: 19   End of Session Equipment Utilized During Treatment: Gait  belt;Rolling walker (2 wheels);Other (comment) (3 in 1 over toilet and in shower) Nurse Communication: Mobility status  Activity Tolerance: Patient tolerated treatment well Patient left: in chair;with call bell/phone within reach;with chair alarm set  OT Visit Diagnosis: Unsteadiness on feet (R26.81);Other abnormalities of gait and mobility (R26.89);Pain Pain - Right/Left:  (bilaterally) Pain - part of body: Leg                Time: 9528-4132 OT Time Calculation (min): 25 min Charges:  OT General Charges $OT Visit: 1  Visit OT Evaluation $OT Eval Moderate Complexity: 1 Mod OT Treatments $Therapeutic Activity: 8-22 mins    Alfred Ann 11/10/2023, 1:03 PM

## 2023-11-10 NOTE — Plan of Care (Signed)
  Problem: Education: Goal: Knowledge of prescribed regimen will improve Outcome: Progressing   Problem: Activity: Goal: Ability to tolerate increased activity will improve Outcome: Progressing   Problem: Bowel/Gastric: Goal: Gastrointestinal status for postoperative course will improve Outcome: Progressing   Problem: Clinical Measurements: Goal: Signs and symptoms of graft occlusion will improve Outcome: Progressing   Problem: Skin Integrity: Goal: Demonstration of wound healing without infection will improve Outcome: Progressing   Problem: Clinical Measurements: Goal: Ability to maintain clinical measurements within normal limits will improve Outcome: Progressing Goal: Will remain free from infection Outcome: Progressing Goal: Diagnostic test results will improve Outcome: Progressing Goal: Respiratory complications will improve Outcome: Progressing Goal: Cardiovascular complication will be avoided Outcome: Progressing   Problem: Activity: Goal: Risk for activity intolerance will decrease Outcome: Progressing   Problem: Nutrition: Goal: Adequate nutrition will be maintained Outcome: Progressing   Problem: Elimination: Goal: Will not experience complications related to bowel motility Outcome: Progressing Goal: Will not experience complications related to urinary retention Outcome: Progressing   Problem: Pain Managment: Goal: General experience of comfort will improve and/or be controlled Outcome: Progressing   Problem: Safety: Goal: Ability to remain free from injury will improve Outcome: Progressing   Problem: Skin Integrity: Goal: Risk for impaired skin integrity will decrease Outcome: Progressing

## 2023-11-10 NOTE — Evaluation (Signed)
 Physical Therapy Evaluation Patient Details Name: Bradley Ferguson MRN: [id number] DOB: 10/21/1950 Today's Date: 11/10/2023  History of Present Illness  Pt is a 73yo male admitted on 5/12 for R femoral endarterectomy and L femoral endarterectomy with retrograde stenting.  PMH:A-fib on Xarelto , tobacco abuse, HLD, s/p R common iliac artery stenting in 2018. S/p L common femoral endarterectomy on 09/02/23.   Clinical Impression  Pt admitted with above. PTA pt renting a room in a home and has roommates who can aide in providing transportation and getting food. Pt was using a rollator but reports not being able to access all areas of home due to rollator being to big. Pt presenting with both cognitive and functional deficits however suspect this is baseline. Pt admits to having poor memory but also demo's decreased insight to safety and deficits as well has impaired comprehension of medical condition and rehab post procedure. Pt to benefit from HHPT upon d/c to assist with safe mobilization with both rollator and SPC. Acute PT to cont to follow.        If plan is discharge home, recommend the following: A little help with walking and/or transfers;A little help with bathing/dressing/bathroom;Assist for transportation;Supervision due to cognitive status;Help with stairs or ramp for entrance   Can travel by private vehicle        Equipment Recommendations  (pt just received a rollator)  Recommendations for Other Services       Functional Status Assessment Patient has had a recent decline in their functional status and demonstrates the ability to make significant improvements in function in a reasonable and predictable amount of time.     Precautions / Restrictions Precautions Precautions: Fall Restrictions Weight Bearing Restrictions Per Provider Order: No      Mobility  Bed Mobility Overal bed mobility: Modified Independent             General bed mobility comments: HOB slightly  elevated, increased time, no use of bed rails, able to manage bilat LEs off EOB    Transfers Overall transfer level: Needs assistance Equipment used: Rolling walker (2 wheels) Transfers: Sit to/from Stand Sit to Stand: Contact guard assist           General transfer comment: contact guard for safety, pt with wide base of support, verbal cues for hand placement, quick to move    Ambulation/Gait Ambulation/Gait assistance: Min assist Gait Distance (Feet): 170 Feet Assistive device: Rolling walker (2 wheels) Gait Pattern/deviations: Step-through pattern, Decreased stride length, Wide base of support Gait velocity: impulsively quick, verbal cues to slow down Gait velocity interpretation: 1.31 - 2.62 ft/sec, indicative of limited community ambulator   General Gait Details: pt quick to move requiring max directional verbal cues to stay in walker and safe walker management when going up to commode to urinate, turning to sink and turning in general. Pt with several episodes of  just letting go of walker and walking without RW in room requiring minA to maintain balance due to antalgia and pain in R LE. Pt does agree using a RW helps him.  Stairs Stairs: Yes Stairs assistance: Min assist Stair Management: One rail Left, Step to pattern, Forwards Number of Stairs: 4 General stair comments: pt educated on sequence of "Up with the good (L LE), down with the bad ( R LE)" however pt with poor carry over despite constant multimodal cues.  Wheelchair Mobility     Tilt Bed    Modified Rankin (Stroke Patients Only)  Balance Overall balance assessment: Needs assistance Sitting-balance support: Feet supported, No upper extremity supported Sitting balance-Leahy Scale: Fair     Standing balance support: No upper extremity supported, During functional activity Standing balance-Leahy Scale: Fair Standing balance comment: pt stood at sink to wash face and brush hair, pt with wide base  of support and noted shakiness from weakness and pain                             Pertinent Vitals/Pain Pain Assessment Pain Assessment: 0-10 Pain Score: 8  Pain Location: R groin Pain Descriptors / Indicators: Sharp Pain Intervention(s): RN gave pain meds during session (given IV dilaudid )    Home Living Family/patient expects to be discharged to:: Private residence Living Arrangements: Non-relatives/Friends Available Help at Discharge: Available PRN/intermittently (roommates) Type of Home: House Home Access: Stairs to enter Entrance Stairs-Rails: Left Entrance Stairs-Number of Steps: 2 +1   Home Layout: One level;Laundry or work area in [employer] (4 wheels)      Prior Function Prior Level of Function : Independent/Modified Independent;Driving             Mobility Comments: was given a rollator last admission, reports he cant bring it into bathroom, desires a cane - instructed pt to purchase one on his own ADLs Comments: "doing the best I could", pt difficulty standing for long periods of time to cook, shower, standing ADLs due to onset of sharp pain in bilat LEs L > R     Extremity/Trunk Assessment   Upper Extremity Assessment Upper Extremity Assessment: Defer to OT evaluation    Lower Extremity Assessment Lower Extremity Assessment: Generalized weakness (reports bilat foot numbness)    Cervical / Trunk Assessment Cervical / Trunk Assessment: Normal  Communication   Communication Communication: No apparent difficulties    Cognition Arousal: Alert Behavior During Therapy: Impulsive   PT - Cognitive impairments: No family/caregiver present to determine baseline (pt reports "i think I have alzheimers or dementia. My memory is getting bad.")                       PT - Cognition Comments: pt is impulsive/quick to move with decreased insight to safety, deficits, as well as comprehension of situation. Suspect this is  patients baseline. Following commands: Intact       Cueing Cueing Techniques: Verbal cues     General Comments General comments (skin integrity, edema, etc.): VSS, pt with report of mild whooziness upon initial stand however ceased and VSS t/o session    Exercises     Assessment/Plan    PT Assessment Patient needs continued PT services  PT Problem List Decreased strength;Decreased activity tolerance;Decreased balance;Decreased mobility       PT Treatment Interventions DME instruction;Gait training;Stair training;Functional mobility training;Therapeutic activities;Therapeutic exercise;Balance training    PT Goals (Current goals can be found in the Care Plan section)  Acute Rehab PT Goals Patient Stated Goal: home PT Goal Formulation: With patient Time For Goal Achievement: 11/24/23 Potential to Achieve Goals: Good    Frequency Min 2X/week     Co-evaluation               AM-PAC PT "6 Clicks" Mobility  Outcome Measure Help needed turning from your back to your side while in a flat bed without using bedrails?: None Help needed moving from lying on your back to sitting on the side of a flat bed without  using bedrails?: None Help needed moving to and from a bed to a chair (including a wheelchair)?: A Little Help needed standing up from a chair using your arms (e.g., wheelchair or bedside chair)?: A Little Help needed to walk in hospital room?: A Little Help needed climbing 3-5 steps with a railing? : A Lot 6 Click Score: 19    End of Session Equipment Utilized During Treatment: Gait belt Activity Tolerance: Patient tolerated treatment well Patient left: in chair;with chair alarm set;with call bell/phone within reach Nurse Communication: Mobility status PT Visit Diagnosis: Unsteadiness on feet (R26.81);Muscle weakness (generalized) (M62.81);Difficulty in walking, not elsewhere classified (R26.2)    Time: 1610-9604 PT Time Calculation (min) (ACUTE ONLY): 42  min   Charges:   PT Evaluation $PT Eval Moderate Complexity: 1 Mod PT Treatments $Gait Training: 8-22 mins $Therapeutic Activity: 8-22 mins PT General Charges $$ ACUTE PT VISIT: 1 Visit         Renaee Caro, PT, DPT Acute Rehabilitation Services Secure chat preferred Office #: [phone]   Jenna Moan 11/10/2023, 9:05 AM

## 2023-11-10 NOTE — TOC Initial Note (Signed)
 Transition of Care (TOC) - Initial/Assessment Note  Sherin Dingwall RN, BSN Transitions of Care Unit 4E- RN Case Manager See Treatment Team for direct phone #   Patient Details  Name: Bradley Ferguson MRN: [id number] Date of Birth: [birth year]  Transition of Care [facility]) CM/SW Contact:    Rox Cope, RN Phone Number: 11/10/2023, 2:55 PM  Clinical Narrative:                 Per PT note recommendations for [facility] f/u.  CM has received notification from [facility] liaison that [facility] office made pre-op referral for any HH needs.   CM spoke with patient at bedside, discussed HH and [facility]VS office referral to [facility]. Choice offered per Medicare guidelines. Pt voiced he is agreeable to use [facility]- not sure if he will need HH follow up but ok with [facility] calling and coming initially.  Pt voiced he has rollator, plans to purchase a cane at some point.   Pt confirmed he has transportation home.   Also discussed calling [facility]- regarding any additional benefits that he may have for home meals, assistance post discharge, etc. Pt voiced he could use meals on wheels but does not qualify.   CM notified [facility] liaison to follow for start of care post discharge.  No further TOC needs noted at this time.   Expected Discharge Plan: Home w Home Health Services Barriers to Discharge: No Barriers Identified   Patient Goals and CMS Choice Patient states their goals for this hospitalization and ongoing recovery are:: return home CMS Medicare.gov Compare Post Acute Care list provided to:: Patient Choice offered to / list presented to : Patient      Expected Discharge Plan and Services   Discharge Planning Services: CM Consult Post Acute Care Choice: Home Health Living arrangements for the past 2 months: Single Family Home                 DME Arranged: N/A DME Agency: NA       HH Arranged: PT, RN HH Agency: [facility] Date HH Agency Contacted: 11/10/23 Time HH  Agency Contacted: 1454 Representative spoke with at [facility] Agency: Glenora Laos  Prior Living Arrangements/Services Living arrangements for the past 2 months: Single Family Home Lives with:: Self Patient language and need for interpreter reviewed:: Yes Do you feel safe going back to the place where you live?: Yes      Need for Family Participation in Patient Care: Yes (Comment) Care giver support system in place?: Yes (comment) Current home services: DME (rollator) Criminal Activity/Legal Involvement Pertinent to Current Situation/Hospitalization: No - Comment as needed  Activities of Daily Living      Permission Sought/Granted Permission sought to share information with : Facility Industrial/product designer granted to share information with : Yes, Verbal Permission Granted     Permission granted to share info w AGENCY: HH        Emotional Assessment Appearance:: Appears stated age Attitude/Demeanor/Rapport: Engaged Affect (typically observed): Accepting, Appropriate Orientation: : Oriented to Self, Oriented to Place, Oriented to  Time, Oriented to Situation Alcohol / Substance Use: Not Applicable Psych Involvement: No (comment)  Admission diagnosis:  Atherosclerosis of native artery of right lower extremity with rest pain (HCC) [I70.221] Critical limb ischemia of right lower extremity (HCC) [I70.221] Atherosclerosis of lower extremity with claudication (HCC) [I70.219] Patient Active Problem List   Diagnosis Date Noted   Critical limb ischemia of right lower extremity (HCC) 11/09/2023   Atherosclerosis of lower extremity  with claudication (HCC) 11/09/2023   Aortoiliac occlusive disease (HCC) 09/02/2023   Critical limb ischemia of left lower extremity (HCC) 07/28/2023   Elevated coronary artery calcium  score 07/13/2023   Hepatic steatosis 07/05/2020   Corn of foot 06/12/2020   Elevated LFTs 06/12/2020   Essential hypertension 04/29/2020   Right inguinal hernia 03/19/2020    TB lung, latent 12/19/2019   Systemic scleroses (HCC) 12/19/2019   Centrilobular emphysema (HCC) 11/24/2019   Interstitial pulmonary disease (HCC) 11/24/2019   Positive ANA (antinuclear antibody) 11/24/2019   Atherosclerosis of aorta (HCC) by Chest CT scan on 01/19/2020 06/30/2019   Chronic obstructive pulmonary disease (HCC) 06/30/2019   Acquired thrombophilia (HCC) 05/10/2019   Colon polyps 12/06/2018   Visit for monitoring Tikosyn  therapy 04/27/2018   Peripheral arterial disease (HCC) 04/16/2017   Labile hypertension 02/29/2016   BPH 02/29/2016   Mixed hyperlipidemia 11/12/2015   Vitamin D  deficiency 11/12/2015   Bipolar I disorder (HCC) 08/13/2015   Atrial fibrillation (HCC) 08/13/2015   Attention deficit disorder 08/08/2010   Dental caries 08/08/2010   ANXIETY DEPRESSION 06/06/2010   TOBACCO ABUSE 06/06/2010   RAYNAUDS SYNDROME 06/06/2010   PCP:  [name], MD Pharmacy:   [facility] [place], [place] - [address] [place] [address] Phone: [phone] Fax: [phone]  [id number] - [place], [place] - [address] [place] [address] Phone: [phone] Fax: [phone]     Social Drivers of Health (SDOH) Social History: SDOH Screenings   Food Insecurity: No Food Insecurity (11/10/2023)  Housing: Low Risk  (11/10/2023)  Transportation Needs: No Transportation Needs (11/10/2023)  Utilities: Not At Risk (11/10/2023)  Alcohol Screen: Low Risk  (10/14/2023)  Depression (PHQ2-9): Medium Risk (10/14/2023)  Financial Resource Strain: Medium Risk (10/14/2023)  Physical Activity: Unknown (10/14/2023)  Social Connections: Moderately Isolated (11/10/2023)  Stress: No Stress Concern Present (10/14/2023)  Tobacco Use: High Risk (11/09/2023)   SDOH Interventions:     Readmission Risk Interventions    11/10/2023    2:54 PM  Readmission Risk Prevention Plan  Post  Dischage Appt Complete  Medication Screening Complete  Transportation Screening Complete

## 2023-11-10 NOTE — Progress Notes (Signed)
 [facility] [place]  Subjective  -no complaints   Objective (!) 110/56 75 98.6 F (37 C) (Oral) 17 96%  Intake/Output Summary (Last 24 hours) at 11/10/2023 0838 Last data filed at 11/10/2023 0407 Gross per 24 hour  Intake 1920.68 ml  Output 1840 ml  Net 80.68 ml    Right groin incision clean and dry without hematoma Brisk right DP PT signals  Laboratory Lab Results: Recent Labs    11/09/23 1247 11/10/23 0415  WBC 7.2 10.8*  HGB 10.9* 9.9*  HCT 33.4* 30.0*  PLT 171 160   BMET Recent Labs    11/09/23 1310 11/10/23 0415  NA  --  136  K  --  4.5  CL  --  104  CO2  --  23  GLUCOSE  --  161*  BUN  --  17  CREATININE 0.83 1.04  CALCIUM   --  8.4*    COAG Lab Results  Component Value Date   INR 1.2 11/03/2023   INR 1.6 (H) 08/18/2023   INR 1.0 04/16/2017   No results found for: "PTT"  Assessment/Planning:  Postop day 1 status post right common femoral endarterectomy including profundoplasty and endarterectomy of the proximal SFA with bovine patch for critical limb ischemia.  Right groin looks great without hematoma.  Brisk DP PT signal in the right foot.  Will see how he progresses today with therapy and still requiring IV pain medicine this morning.  Aspirin  statin for risk reduction.  Will hold restarting his Xarelto  for now and this is for A-fib.  [name] 11/10/2023 8:38 AM --

## 2023-11-11 MED ORDER — OXYCODONE HCL 5 MG PO TABS: 5.0000 mg | ORAL_TABLET | Freq: Four times a day (QID) | ORAL | 0 refills | Status: DC | PRN

## 2023-11-11 NOTE — TOC Transition Note (Signed)
 Transition of Care [facility]) - Discharge Note Sherin Dingwall RN, BSN Transitions of Care Unit 4E- RN Case Manager See Treatment Team for direct phone #   Patient Details  Name: Bradley Ferguson MRN: [id number] Date of Birth: [birth year]  Transition of Care [facility]) CM/SW Contact:  Rox Cope, RN Phone Number: 11/11/2023, 1:07 PM   Clinical Narrative:    Pt stable for transition home today, [facility] will do HH follow up with pt for HHPT per VVS office referral. Liaison to contact pt for scheduling.   No further TOC needs noted. Per pt he has transportation home.    Final next level of care: Home w Home Health Services Barriers to Discharge: No Barriers Identified   Patient Goals and CMS Choice Patient states their goals for this hospitalization and ongoing recovery are:: return home CMS Medicare.gov Compare Post Acute Care list provided to:: Patient Choice offered to / list presented to : Patient      Discharge Placement               Home w/ [facility]        Discharge Plan and Services Additional resources added to the After Visit Summary for   In-house Referral: NA Discharge Planning Services: CM Consult Post Acute Care Choice: Home Health          DME Arranged: N/A DME Agency: NA       HH Arranged: PT HH Agency: [facility] Date HH Agency Contacted: 11/10/23 Time HH Agency Contacted: 1454 Representative spoke with at [facility] Agency: Glenora Laos  Social Drivers of Health (SDOH) Interventions SDOH Screenings   Food Insecurity: No Food Insecurity (11/10/2023)  Housing: Low Risk  (11/10/2023)  Transportation Needs: No Transportation Needs (11/10/2023)  Utilities: Not At Risk (11/10/2023)  Alcohol Screen: Low Risk  (10/14/2023)  Depression (PHQ2-9): Medium Risk (10/14/2023)  Financial Resource Strain: Medium Risk (10/14/2023)  Physical Activity: Unknown (10/14/2023)  Social Connections: Moderately Isolated (11/10/2023)  Stress: No Stress Concern Present  (10/14/2023)  Tobacco Use: High Risk (11/09/2023)     Readmission Risk Interventions    11/10/2023    2:54 PM  Readmission Risk Prevention Plan  Post Dischage Appt Complete  Medication Screening Complete  Transportation Screening Complete

## 2023-11-11 NOTE — Progress Notes (Addendum)
  Progress Note    11/11/2023 7:57 AM 2 Days Post-Op  Subjective:  no major complaints. Says he finally got some sleep. He has been having intermittent sharp shooting pains in right upper thigh   Vitals:   11/11/23 0337 11/11/23 0741  BP: 117/66 120/73  Pulse: 76 77  Resp: 20 (!) 22  Temp: 98.9 F (37.2 C) 98.8 F (37.1 C)  SpO2: 95% 94%   Physical Exam: Cardiac:  regular Lungs:  non labored Incisions:  right groin incision well appearing, no hematoma Extremities:  RLE well perfused and warm with DP/PT/Pero signals Abdomen:  soft Neurologic: alert and oriented  CBC    Component Value Date/Time   WBC 10.8 (H) 11/10/2023 0415   RBC 3.36 (L) 11/10/2023 0415   HGB 9.9 (L) 11/10/2023 0415   HGB 14.4 06/16/2023 0927   HCT 30.0 (L) 11/10/2023 0415   HCT 44.2 06/16/2023 0927   PLT 160 11/10/2023 0415   PLT 180 06/16/2023 0927   MCV 89.3 11/10/2023 0415   MCV 93 06/16/2023 0927   MCH 29.5 11/10/2023 0415   MCHC 33.0 11/10/2023 0415   RDW 14.0 11/10/2023 0415   RDW 13.4 06/16/2023 0927   LYMPHSABS 2.2 06/16/2023 0927   MONOABS 630 02/05/2017 1234   EOSABS 260 07/06/2023 1433   EOSABS 0.4 06/16/2023 0927   BASOSABS 62 07/06/2023 1433   BASOSABS 0.1 06/16/2023 0927    BMET    Component Value Date/Time   NA 136 11/10/2023 0415   NA 140 11/02/2023 1409   K 4.5 11/10/2023 0415   CL 104 11/10/2023 0415   CO2 23 11/10/2023 0415   GLUCOSE 161 (H) 11/10/2023 0415   BUN 17 11/10/2023 0415   BUN 13 11/02/2023 1409   CREATININE 1.04 11/10/2023 0415   CREATININE 0.97 07/06/2023 1433   CALCIUM  8.4 (L) 11/10/2023 0415   GFRNONAA >60 11/10/2023 0415   GFRNONAA 70 11/12/2020 0000   GFRAA 82 11/12/2020 0000    INR    Component Value Date/Time   INR 1.2 11/03/2023 1430   INR 3.2 06/06/2010 1032     Intake/Output Summary (Last 24 hours) at 11/11/2023 0757 Last data filed at 11/11/2023 1610 Gross per 24 hour  Intake 720 ml  Output 650 ml  Net 70 ml      Assessment/Plan:  73 y.o. male is s/p right common femoral endarterectomy including profundoplasty and endarterectomy of the proximal SFA with bovine patch  2 Days Post-Op   Right groin incision is healing well, no swelling or hematoma Right foot well perfused with doppler DP/PT/Pero signals Pain control as needed Mobilize as tolerated HH arranged with [facility] Aspirin  and statin Anticipate discharge later today  Remona Carmel [facility] [phone] 11/11/2023 7:57 AM  I have seen and evaluated the patient. I agree with the PA note as documented above.  Postop day 2 status post right common femoral endarterectomy with profundoplasty and endarterectomy of the proximal SFA.  Right groin looks good.  Brisk Doppler signals in the right foot.  Plan discharge today.  2 to 3-week follow-up for incision check.  Aspirin  statin for risk reduction.  Restart Xarelto  tomorrow.  [name], MD [facility] [place] Office: [phone]

## 2023-11-11 NOTE — Plan of Care (Signed)
  Problem: Education: Goal: Knowledge of prescribed regimen will improve Outcome: Adequate for Discharge   Problem: Activity: Goal: Ability to tolerate increased activity will improve Outcome: Adequate for Discharge   Problem: Bowel/Gastric: Goal: Gastrointestinal status for postoperative course will improve Outcome: Adequate for Discharge   Problem: Clinical Measurements: Goal: Postoperative complications will be avoided or minimized Outcome: Adequate for Discharge Goal: Signs and symptoms of graft occlusion will improve Outcome: Adequate for Discharge   Problem: Skin Integrity: Goal: Demonstration of wound healing without infection will improve Outcome: Adequate for Discharge   Problem: Health Behavior/Discharge Planning: Goal: Ability to manage health-related needs will improve Outcome: Adequate for Discharge   Problem: Clinical Measurements: Goal: Ability to maintain clinical measurements within normal limits will improve Outcome: Adequate for Discharge Goal: Will remain free from infection Outcome: Adequate for Discharge Goal: Diagnostic test results will improve Outcome: Adequate for Discharge Goal: Respiratory complications will improve Outcome: Adequate for Discharge Goal: Cardiovascular complication will be avoided Outcome: Adequate for Discharge   Problem: Activity: Goal: Risk for activity intolerance will decrease Outcome: Adequate for Discharge   Problem: Nutrition: Goal: Adequate nutrition will be maintained Outcome: Adequate for Discharge   Problem: Coping: Goal: Level of anxiety will decrease Outcome: Adequate for Discharge   Problem: Elimination: Goal: Will not experience complications related to bowel motility Outcome: Adequate for Discharge Goal: Will not experience complications related to urinary retention Outcome: Adequate for Discharge   Problem: Pain Managment: Goal: General experience of comfort will improve and/or be  controlled Outcome: Adequate for Discharge   Problem: Safety: Goal: Ability to remain free from injury will improve Outcome: Adequate for Discharge   Problem: Skin Integrity: Goal: Risk for impaired skin integrity will decrease Outcome: Adequate for Discharge   Problem: Acute Rehab PT Goals(only PT should resolve) Goal: Patient Will Transfer Sit To/From Stand Outcome: Adequate for Discharge Goal: Pt Will Ambulate Outcome: Adequate for Discharge Goal: Pt Will Go Up/Down Stairs Outcome: Adequate for Discharge   Problem: Acute Rehab PT Goals(only PT should resolve) Goal: Pt Will Perform Standing Balance Or Pre-Gait Outcome: Adequate for Discharge   Problem: Acute Rehab OT Goals (only OT should resolve) Goal: Pt. Will Perform Grooming Outcome: Adequate for Discharge Goal: Pt. Will Perform Lower Body Bathing Outcome: Adequate for Discharge Goal: Pt. Will Perform Lower Body Dressing Outcome: Adequate for Discharge Goal: Pt. Will Transfer To Toilet Outcome: Adequate for Discharge Goal: Pt. Will Perform Toileting-Clothing Manipulation Outcome: Adequate for Discharge Goal: Pt. Will Perform Tub/Shower Transfer Outcome: Adequate for Discharge

## 2023-11-11 NOTE — Progress Notes (Signed)
 Physical Therapy Treatment Patient Details Name: Bradley Ferguson MRN: [id number] DOB: [birth year] Today's Date: 11/11/2023   History of Present Illness Pt is a 73yo male admitted on 5/12 for R femoral endarterectomy and L femoral endarterectomy with retrograde stenting.  PMH:A-fib on Xarelto , tobacco abuse, HLD, s/p R common iliac artery stenting in 2018. S/p L common femoral endarterectomy on 09/02/23.    PT Comments  Pt resting in bed on arrival and agreeable to session with good progress towards acute goals. Pt with less impulsivity this date, demonstrate safe RW use throughout and receptive to cues for safety with stari negotiation. Pt demonstrating bed mobility, transfers, gait and stair training with grossly CGA for safety with cues for optimal hand placements and techniques throughout. Pt was educated on continued walker use to maximize functional independence, safety, and decrease risk for falls. Pt continues to benefit from skilled PT services to progress toward functional mobility goals.     If plan is discharge home, recommend the following: A little help with walking and/or transfers;A little help with bathing/dressing/bathroom;Assist for transportation;Supervision due to cognitive status;Help with stairs or ramp for entrance   Can travel by private vehicle        Equipment Recommendations   (pt just received a rollator)    Recommendations for Other Services       Precautions / Restrictions Precautions Precautions: Fall Restrictions Weight Bearing Restrictions Per Provider Order: No     Mobility  Bed Mobility Overal bed mobility: Modified Independent                  Transfers Overall transfer level: Needs assistance Equipment used: Rolling walker (2 wheels) Transfers: Sit to/from Stand Sit to Stand: Supervision           General transfer comment: good hand placement    Ambulation/Gait Ambulation/Gait assistance: Contact guard assist Gait Distance  (Feet): 175 Feet (x2) Assistive device: Rolling walker (2 wheels) Gait Pattern/deviations: Step-through pattern, Decreased stride length, Wide base of support       General Gait Details: pt with improved RW use this session, grossly CGA for safety with in room and hallway gait   Stairs Stairs: Yes Stairs assistance: Contact guard assist Stair Management: Step to pattern, One rail Right, Sideways Number of Stairs: 10 (flight) General stair comments: pt educated again on sequence of "Up with the good (L LE), down with the bad ( R LE)" however pt with poor carry over despite constant multimodal cues.   Wheelchair Mobility     Tilt Bed    Modified Rankin (Stroke Patients Only)       Balance Overall balance assessment: Needs assistance Sitting-balance support: Feet supported, No upper extremity supported Sitting balance-Leahy Scale: Fair     Standing balance support: No upper extremity supported, During functional activity Standing balance-Leahy Scale: Fair Standing balance comment: able to static stand without UE support                            Communication Communication Communication: No apparent difficulties  Cognition Arousal: Alert Behavior During Therapy: WFL for tasks assessed/performed, Impulsive (mildly impulsive)   PT - Cognitive impairments: No family/caregiver present to determine baseline                         Following commands: Intact      Cueing Cueing Techniques: Verbal cues  Exercises      General Comments  General comments (skin integrity, edema, etc.): HR tachy up to 142bpm but quickly back to 120-110s      Pertinent Vitals/Pain Pain Assessment Pain Assessment: Faces Faces Pain Scale: Hurts a little bit Pain Location: R groin Pain Descriptors / Indicators: Sore Pain Intervention(s): Monitored during session, Limited activity within patient's tolerance, Premedicated before session    Home Living                           Prior Function            PT Goals (current goals can now be found in the care plan section) Acute Rehab PT Goals Patient Stated Goal: home PT Goal Formulation: With patient Time For Goal Achievement: 11/24/23 Progress towards PT goals: Progressing toward goals    Frequency    Min 2X/week      PT Plan      Co-evaluation              AM-PAC PT "6 Clicks" Mobility   Outcome Measure  Help needed turning from your back to your side while in a flat bed without using bedrails?: None Help needed moving from lying on your back to sitting on the side of a flat bed without using bedrails?: None Help needed moving to and from a bed to a chair (including a wheelchair)?: A Little Help needed standing up from a chair using your arms (e.g., wheelchair or bedside chair)?: A Little Help needed to walk in hospital room?: A Little Help needed climbing 3-5 steps with a railing? : A Lot 6 Click Score: 19    End of Session Equipment Utilized During Treatment: Gait belt Activity Tolerance: Patient tolerated treatment well Patient left: with call bell/phone within reach;in bed Nurse Communication: Mobility status PT Visit Diagnosis: Unsteadiness on feet (R26.81);Muscle weakness (generalized) (M62.81);Difficulty in walking, not elsewhere classified (R26.2)     Time: 1478-2956 PT Time Calculation (min) (ACUTE ONLY): 26 min  Charges:    $Gait Training: 23-37 mins PT General Charges $$ ACUTE PT VISIT: 1 Visit                     Torence Palmeri R. PTA Acute Rehabilitation Services Office: [phone]   Agapito Horseman 11/11/2023, 10:45 AM

## 2023-11-11 NOTE — Progress Notes (Signed)
 Patient discharged home by Discharge nurse Zarha RN . AVS provided and reviewed    Anshu Wehner, Cammie Cellar, RN

## 2023-11-11 NOTE — Progress Notes (Signed)
 Occupational Therapy Treatment Patient Details Name: Bradley Ferguson MRN: [id number] DOB: 1950/12/29 Today's Date: 11/11/2023   History of present illness Pt is a 73yo male admitted on 5/12 for R femoral endarterectomy and L femoral endarterectomy with retrograde stenting.  PMH:A-fib on Xarelto , tobacco abuse, HLD, s/p R common iliac artery stenting in 2018. S/p L common femoral endarterectomy on 09/02/23.   OT comments  Pt at this time presented in bed and agreeable to session. At this time was able to don shoes but reported due to RLE pain requesting therapist to attempt to tie shoes. In this session educated about AE with LE dressing due to pain with LE dressing. At this time recommendation for [facility] PT as pt would like to transition to cane use.       If plan is discharge home, recommend the following:  A little help with bathing/dressing/bathroom;A little help with walking and/or transfers;Assistance with cooking/housework;Assist for transportation;Help with stairs or ramp for entrance   Equipment Recommendations  BSC/3in1;Tub/shower bench;Toilet riser (pt reporting that they unsuer if they can have DME with [employer])    Recommendations for Other Services      Precautions / Restrictions Precautions Precautions: Fall Restrictions Weight Bearing Restrictions Per Provider Order: No       Mobility Bed Mobility Overal bed mobility: Modified Independent                  Transfers Overall transfer level: Needs assistance Equipment used: Rolling walker (2 wheels) Transfers: Sit to/from Stand Sit to Stand: Supervision                 Balance Overall balance assessment: Needs assistance Sitting-balance support: Feet supported Sitting balance-Leahy Scale: Good     Standing balance support: During functional activity, Bilateral upper extremity supported Standing balance-Leahy Scale: Fair Standing balance comment: able to static stand without UE support                            ADL either performed or assessed with clinical judgement   ADL Overall ADL's : Needs assistance/impaired Eating/Feeding: Independent   Grooming: Wash/dry hands;Wash/dry face;Brushing hair;Contact guard assist;Standing;Cueing for safety   Upper Body Bathing: Supervision/ safety;Sitting   Lower Body Bathing: Modified independent;Sit to/from stand;Sitting/lateral leans   Upper Body Dressing : Modified independent;Sitting   Lower Body Dressing: Modified independent;Sit to/from stand;Sitting/lateral leans   Toilet Transfer: Supervision/safety;Rolling walker (2 wheels);Regular Teacher, adult education Details (indicate cue type and reason): standing Toileting- Clothing Manipulation and Hygiene: Contact guard assist;Cueing for safety;Cueing for sequencing       Functional mobility during ADLs: Supervision/safety;Rolling walker (2 wheels);Cueing for sequencing;Cueing for safety      Extremity/Trunk Assessment Upper Extremity Assessment Upper Extremity Assessment: Overall WFL for tasks assessed   Lower Extremity Assessment Lower Extremity Assessment: Defer to PT evaluation        Vision   Vision Assessment?: Wears glasses for reading   Perception     Praxis Praxis Praxis: [facility]   Communication Communication Communication: No apparent difficulties   Cognition Arousal: Alert Behavior During Therapy: WFL for tasks assessed/performed, Impulsive (likes to move quickly) Cognition: No family/caregiver present to determine baseline                               Following commands: Intact        Cueing   Cueing Techniques: Verbal cues  Exercises  Shoulder Instructions       General Comments max hr 109    Pertinent Vitals/ Pain       Pain Assessment Pain Assessment: Faces Faces Pain Scale: Hurts a little bit Pain Location: R groin Pain Descriptors / Indicators: Sore Pain Intervention(s): Limited activity within patient's  tolerance, Monitored during session, Repositioned  Home Living                                          Prior Functioning/Environment              Frequency  Min 2X/week        Progress Toward Goals  OT Goals(current goals can now be found in the care plan section)  Progress towards OT goals: Progressing toward goals  Acute Rehab OT Goals Patient Stated Goal: to go home today OT Goal Formulation: With patient Time For Goal Achievement: 11/24/23 Potential to Achieve Goals: Good ADL Goals Pt Will Perform Grooming: with supervision;with set-up;standing Pt Will Perform Lower Body Bathing: with supervision;with modified independence;with adaptive equipment Pt Will Perform Lower Body Dressing: with supervision;with modified independence;with adaptive equipment Pt Will Transfer to Toilet: with supervision;with modified independence;ambulating Pt Will Perform Toileting - Clothing Manipulation and hygiene: with supervision;with modified independence;sit to/from stand Pt Will Perform Tub/Shower Transfer: with supervision;with modified independence;ambulating;rolling walker;grab bars  Plan      Co-evaluation                 AM-PAC OT "6 Clicks" Daily Activity     Outcome Measure   Help from another person eating meals?: None Help from another person taking care of personal grooming?: A Little Help from another person toileting, which includes using toliet, bedpan, or urinal?: A Little Help from another person bathing (including washing, rinsing, drying)?: A Little Help from another person to put on and taking off regular upper body clothing?: A Little Help from another person to put on and taking off regular lower body clothing?: A Little 6 Click Score: 19    End of Session Equipment Utilized During Treatment: Gait belt;Rolling walker (2 wheels)  OT Visit Diagnosis: Unsteadiness on feet (R26.81);Other abnormalities of gait and mobility  (R26.89);Pain Pain - part of body: Leg   Activity Tolerance Patient tolerated treatment well   Patient Left in bed;with call bell/phone within reach   Nurse Communication Mobility status        Time: 1610-9604 OT Time Calculation (min): 16 min  Charges: OT General Charges $OT Visit: 1 Visit OT Treatments $Self Care/Home Management : 8-22 mins  Erving Heather OTR/L  Acute Rehab Services  [phone] office number   Stevphen Elders 11/11/2023, 11:46 AM

## 2023-11-12 ENCOUNTER — Telehealth: Payer: Self-pay

## 2023-11-12 DIAGNOSIS — Z79891 Long term (current) use of opiate analgesic: Secondary | ICD-10-CM | POA: Diagnosis not present

## 2023-11-12 DIAGNOSIS — I10 Essential (primary) hypertension: Secondary | ICD-10-CM | POA: Diagnosis not present

## 2023-11-12 DIAGNOSIS — Z7982 Long term (current) use of aspirin: Secondary | ICD-10-CM | POA: Diagnosis not present

## 2023-11-12 DIAGNOSIS — I739 Peripheral vascular disease, unspecified: Secondary | ICD-10-CM | POA: Diagnosis not present

## 2023-11-12 DIAGNOSIS — Z48812 Encounter for surgical aftercare following surgery on the circulatory system: Secondary | ICD-10-CM | POA: Diagnosis not present

## 2023-11-12 DIAGNOSIS — Z7901 Long term (current) use of anticoagulants: Secondary | ICD-10-CM | POA: Diagnosis not present

## 2023-11-12 DIAGNOSIS — J45909 Unspecified asthma, uncomplicated: Secondary | ICD-10-CM | POA: Diagnosis not present

## 2023-11-12 NOTE — Transitions of Care (Post Inpatient/ED Visit) (Signed)
 11/12/2023  Name: Bradley Ferguson MRN: [id number] DOB: Sep 15, 1950  Today's TOC FU Call Status: Today's TOC FU Call Status:: Successful TOC FU Call Completed TOC FU Call Complete Date: 11/12/23 Patient's Name and Date of Birth confirmed.  Transition Care Management Follow-up Telephone Call Date of Discharge: 11/11/23 Discharge Facility: [facility]) Type of Discharge: Inpatient Admission Primary Inpatient Discharge Diagnosis:: Critical limb ischemia of the right lower extremity How have you been since you were released from the hospital?: Better (feels some better, continues to have leg pain.) Any questions or concerns?: Yes Patient Questions/Concerns:: leaking urine, no dysuria, urgency.  Had a catheter while in the hospital. Patient Questions/Concerns Addressed: Notified Provider of Patient Questions/Concerns  Items Reviewed: Did you receive and understand the discharge instructions provided?: Yes Medications obtained,verified, and reconciled?: Yes (Medications Reviewed) Any new allergies since your discharge?: No Dietary orders reviewed?: Yes Type of Diet Ordered:: Resume previous diet.  Healthy diet Do you have support at home?: Yes People in Home [RPT]: friend(s) Name of Support/Comfort Primary Source: unable to provide name  Medications Reviewed Today: Medications Reviewed Today     Reviewed by Vanetta Generous, RN (Registered Nurse) on 11/12/23 at 1159  Med List Status: <None>   Medication Order Taking? Sig Documenting Provider Last Dose Status Informant  albuterol  (VENTOLIN  HFA) 108 (90 Base) MCG/ACT inhaler [id number] Yes Use  2 inhalations  15 minutes apart  every 4 hours  to rescue Asthma Attack Vangie Genet, MD Taking Active Self  aspirin  EC 81 MG tablet [id number] Yes Take 81 mg by mouth daily. Swallow whole. [provider] Taking Active Self  atorvastatin  (LIPITOR ) 80 MG tablet [id number] Yes Take 1 tablet Daily for Cholesterol  Patient taking  differently: Take 80 mg by mouth every evening. Take 1 tablet Daily for Cholesterol   Webb, Padonda B, FNP Taking Active Self  Cholecalciferol  250 MCG (10000 UT) CAPS [id number] Yes Take 10,000 Units by mouth every evening. [provider] Taking Active Self  dofetilide  (TIKOSYN ) 500 MCG capsule [id number] Yes TAKE 1 CAPSULE BY MOUTH 2 TIMES DAILY. Avanell Leigh, MD Taking Active Self  gabapentin  (NEURONTIN ) 300 MG capsule [id number] Yes Take 1 capsule 3 x /day for Neuropathy Pain Webb, Padonda B, FNP Taking Active Self  ketoconazole  (NIZORAL ) 2 % cream [id number] No Apply 1 Application topically daily as needed for irritation.  Patient not taking: Reported on 11/12/2023   [provider] Not Taking Active Self  oxyCODONE  (OXY IR/ROXICODONE ) 5 MG immediate release tablet [id number] Yes Take 1 tablet (5 mg total) by mouth every 6 (six) hours as needed for severe pain (pain score 7-10). Baglia, Corrina, PA-C Taking Active   pyridOXINE (VITAMIN B-6) 100 MG tablet [id number] Yes Take 100 mg by mouth every evening. [provider] Taking Active Self  rivaroxaban  (XARELTO ) 20 MG TABS tablet [id number] Yes Take 1 tablet by mouth daily to Prevent Blood Clots  Patient taking differently: Take 20 mg by mouth daily after supper. Take 1 tablet by mouth daily to Prevent Blood Clots   Cordie Deters, PA-C Taking Active Self  triamcinolone  cream (KENALOG ) 0.5 % [id number] No Apply 1 Application topically daily as needed (skin irritation).  Patient not taking: Reported on 11/12/2023   [provider] Not Taking Active Self            Home Care and Equipment/Supplies: Were Home Health Services Ordered?: Yes Name of Home Health Agency:: [facility] Has Agency set up a time to come to your  home?: Yes First Home Health Visit Date: 11/12/23 Any new equipment or medical supplies ordered?: No  Functional Questionnaire: Do you need assistance with bathing/showering or dressing?:  No Do you need assistance with meal preparation?: No Do you need assistance with eating?: No Do you have difficulty maintaining continence: Yes (leaking urine) Do you need assistance with getting out of bed/getting out of a chair/moving?: No (uses rollator) Do you have difficulty managing or taking your medications?: No  Follow up appointments reviewed: PCP Follow-up appointment confirmed?: No (Patient to call and discuss with office AWV tomorrow by phone.) MD Provider Line Number:[phone] Given: No Specialist Hospital Follow-up appointment confirmed?: Yes Date of Specialist follow-up appointment?: 12/01/23 Follow-Up Specialty Provider:: [facility]ist Do you need transportation to your follow-up appointment?: No Do you understand care options if your condition(s) worsen?: Yes-patient verbalized understanding  SDOH Interventions Today    Flowsheet Row Most Recent Value  SDOH Interventions   Food Insecurity Interventions Other (Comment)  [reviewed HTA benefits and encouraged patient to call to inquire.]  Housing Interventions Intervention Not Indicated  Transportation Interventions Other (Comment)  [HTA benefits reviewed for transportation to appointments.]  Utilities Interventions Intervention Not Indicated      Patient reports that he is doing well. Reviewed all discharge instructions. Reviewed all wound care and activity restrictions. Encouraged patient to call PCP and make a hospital follow up appointment. Reviewed with patient that he is scheduled for an AWV tomorrow via phone. Encouraged patient to call [employer] to review his benefits for transportation and food, He voiced understanding.   Home health coming today to see patient. Reviewed and offered 30 day TOC program and patient declined. Provided my contact information for patient to call me if needed.   Noted: AVS in the media tab.  Orpha Blade, RN, BSN, CEN [employer]- Transition of Care Team.  [facility] [phone]

## 2023-11-12 NOTE — Patient Instructions (Signed)
 Visit Information  Thank you for taking time to visit with me today.   Constipation:  Can use over the counters medications like Miralax, Senna, Colace or call your MD for suggestions. Drink plenty of water  and be as active as you can.      Please call the Suicide and Crisis Lifeline: 988 call the [place]  National Suicide Prevention Lifeline: 816-145-6429 or TTY: 918-819-0643 TTY 709-499-3592) to talk to a trained counselor call 1-800-273-TALK (toll free, 24 hour hotline) call 911 if you are experiencing a Mental Health or Behavioral Health Crisis or need someone to talk to.  Orpha Blade, RN, BSN, CEN [employer]- Transition of Care Team.  [facility] [phone]

## 2023-11-13 ENCOUNTER — Ambulatory Visit

## 2023-11-13 DIAGNOSIS — Z0000 Encounter for general adult medical examination without abnormal findings: Secondary | ICD-10-CM

## 2023-11-13 NOTE — Patient Instructions (Signed)
 Bradley Ferguson , Thank you for taking time out of your busy schedule to complete your Annual Wellness Visit with me. I enjoyed our conversation and look forward to speaking with you again next year. I, as well as your care team,  appreciate your ongoing commitment to your health goals. Please review the following plan we discussed and let me know if I can assist you in the future. Your Game plan/ To Do List    Referrals: If you haven't heard from the office you've been referred to, please reach out to them at the phone provided.  N/a Follow up Visits: Next Medicare AWV with our clinical staff: 11/18/2024 at 11:30   Have you seen your provider in the last 6 months (3 months if uncontrolled diabetes)? Yes Next Office Visit with your provider: 01/12/2024 at 10:40  Clinician Recommendations:  Aim for 30 minutes of exercise or brisk walking, 6-8 glasses of water , and 5 servings of fruits and vegetables each day.       This is a list of the screening recommended for you and due dates:  Health Maintenance  Topic Date Due   Screening for Lung Cancer  06/04/2022   COVID-19 Vaccine (6 - 2024-25 season) 03/01/2023   Colon Cancer Screening  08/02/2023   Zoster (Shingles) Vaccine (2 of 2) 01/13/2024*   DTaP/Tdap/Td vaccine (2 - Tdap) 10/13/2024*   Flu Shot  01/29/2024   Medicare Annual Wellness Visit  11/12/2024   Pneumonia Vaccine  Completed   Hepatitis C Screening  Completed   HPV Vaccine  Aged Out   Meningitis B Vaccine  Aged Out  *Topic was postponed. The date shown is not the original due date.    Advanced directives: (Provided) Advance directive discussed with you today. I have provided a copy for you to complete at home and have notarized. Once this is complete, please bring a copy in to our office so we can scan it into your chart.  Advance Care Planning is important because it:  [x]  Makes sure you receive the medical care that is consistent with your values, goals, and preferences  [x]  It  provides guidance to your family and loved ones and reduces their decisional burden about whether or not they are making the right decisions based on your wishes.  Follow the link provided in your after visit summary or read over the paperwork we have mailed to you to help you started getting your Advance Directives in place. If you need assistance in completing these, please reach out to us  so that we can help you!  See attachments for Preventive Care and Fall Prevention Tips.

## 2023-11-13 NOTE — Progress Notes (Signed)
 Subjective:   Bradley Ferguson is a 73 y.o. who presents for a Medicare Wellness preventive visit.  As a reminder, Annual Wellness Visits don't include a physical exam, and some assessments may be limited, especially if this visit is performed virtually. We may recommend an in-person follow-up visit with your provider if needed.  Visit Complete: Virtual I connected with  Bradley Ferguson on 11/13/23 by a audio enabled telemedicine application and verified that I am speaking with the correct person using two identifiers.  Patient Location: Home  Provider Location: Office/Clinic  I discussed the limitations of evaluation and management by telemedicine. The patient expressed understanding and agreed to proceed.  Vital Signs: Because this visit was a virtual/telehealth visit, some criteria may be missing or patient reported. Any vitals not documented were not able to be obtained and vitals that have been documented are patient reported.  VideoError- Librarian, academic were attempted between this provider and patient, however failed, due to patient having technical difficulties OR patient did not have access to video capability.  We continued and completed visit with audio only.   Persons Participating in Visit: Patient.  AWV Questionnaire: Yes: Patient Medicare AWV questionnaire was completed by the patient on 11/13/2023; I have confirmed that all information answered by patient is correct and no changes since this date.  Cardiac Risk Factors include: advanced age (>39men, >45 women);dyslipidemia;hypertension;male gender     Objective:     Today's Vitals   There is no height or weight on file to calculate BMI.     11/13/2023   11:35 AM 11/03/2023    1:49 PM 09/02/2023    7:17 AM 08/18/2023    1:25 PM 07/09/2023    5:58 AM 08/26/2022   12:26 PM 11/07/2021   11:15 AM  Advanced Directives  Does Patient Have a Medical Advance Directive? No No No No No No No  Would  patient like information on creating a medical advance directive?  No - Patient declined No - Patient declined No - Patient declined No - Patient declined Yes (MAU/Ambulatory/Procedural Areas - Information given) No - Patient declined    Current Medications (verified) Outpatient Encounter Medications as of 11/13/2023  Medication Sig   albuterol  (VENTOLIN  HFA) 108 (90 Base) MCG/ACT inhaler Use  2 inhalations  15 minutes apart  every 4 hours  to rescue Asthma Attack   aspirin  EC 81 MG tablet Take 81 mg by mouth daily. Swallow whole.   atorvastatin  (LIPITOR ) 80 MG tablet Take 1 tablet Daily for Cholesterol (Patient taking differently: Take 80 mg by mouth every evening. Take 1 tablet Daily for Cholesterol)   Cholecalciferol  250 MCG (10000 UT) CAPS Take 10,000 Units by mouth every evening.   dofetilide  (TIKOSYN ) 500 MCG capsule TAKE 1 CAPSULE BY MOUTH 2 TIMES DAILY.   gabapentin  (NEURONTIN ) 300 MG capsule Take 1 capsule 3 x /day for Neuropathy Pain   oxyCODONE  (OXY IR/ROXICODONE ) 5 MG immediate release tablet Take 1 tablet (5 mg total) by mouth every 6 (six) hours as needed for severe pain (pain score 7-10).   pyridOXINE (VITAMIN B-6) 100 MG tablet Take 100 mg by mouth every evening.   rivaroxaban  (XARELTO ) 20 MG TABS tablet Take 1 tablet by mouth daily to Prevent Blood Clots (Patient taking differently: Take 20 mg by mouth daily after supper. Take 1 tablet by mouth daily to Prevent Blood Clots)   ketoconazole  (NIZORAL ) 2 % cream Apply 1 Application topically daily as needed for irritation. (Patient not  taking: Reported on 11/12/2023)   triamcinolone  cream (KENALOG ) 0.5 % Apply 1 Application topically daily as needed (skin irritation). (Patient not taking: Reported on 11/12/2023)   No facility-administered encounter medications on file as of 11/13/2023.    Allergies (verified) Cymbalta  [duloxetine  hcl], Effexor [venlafaxine], Wellbutrin  [bupropion ], and Zoloft [sertraline hcl]   History: Past Medical  History:  Diagnosis Date   Anxiety    Atrial fibrillation (HCC)    Blood transfusion without reported diagnosis    Cataract    Depression    Emphysema of lung (HCC)    Heart murmur    Hyperlipidemia    Hypertension    PVD (peripheral vascular disease) (HCC)    Raynaud's syndrome    Substance abuse (HCC)    Tobacco   Systemic sclerosis (HCC)    TB lung, latent 12/19/2019   s/p INH x 3 months   Transaminitis 06/18/2020   Past Surgical History:  Procedure Laterality Date   ABDOMINAL AORTOGRAM W/LOWER EXTREMITY Left 07/09/2023   Procedure: ABDOMINAL AORTOGRAM W/LOWER EXTREMITY;  Surgeon: Avanell Leigh, MD;  Location: [facility];  Service: Cardiovascular;  Laterality: Left;   ATRIAL FIBRILLATION ABLATION     CATARACT EXTRACTION Right    COLONOSCOPY     ENDARTERECTOMY FEMORAL Left 09/02/2023   Procedure: LEFT LOWER EXTREMITY ANGIOGRAM WITH LEFT ILIAC  STENTING;  Surgeon: [name], MD;  Location: [facility] OR;  Service: Vascular;  Laterality: Left;   ENDARTERECTOMY FEMORAL Right 11/09/2023   Procedure: ENDARTERECTOMY, FEMORAL TO PROFUNDA;  Surgeon: [name], MD;  Location: [facility] OR;  Service: Vascular;  Laterality: Right;   EYE SURGERY     FEMORAL-POPLITEAL BYPASS GRAFT Left 09/02/2023   Procedure: LEFT FEMORAL ENDARTARECTOMY WITH PATCH ANGIOPLASTY;  Surgeon: [name], MD;  Location: [facility] OR;  Service: Vascular;  Laterality: Left;   FRACTURE SURGERY Left 1976   HERNIA REPAIR  2023   INGUINAL HERNIA REPAIR Right 03/23/2020   Procedure: OPEN RIGHT INGUINAL HERNIA REPAIR WITH MESH;  Surgeon: Jacolyn Matar, MD;  Location: [facility] OR;  Service: General;  Laterality: Right;   INSERTION OF MESH Right 03/23/2020   Procedure: INSERTION OF MESH;  Surgeon: Jacolyn Matar, MD;  Location: [facility] OR;  Service: General;  Laterality: Right;   LOWER EXTREMITY INTERVENTION N/A 04/27/2017   Procedure: LOWER EXTREMITY INTERVENTION;  Surgeon: Avanell Leigh, MD;   Location: [facility];  Service: Cardiovascular;  Laterality: N/A;   PATCH ANGIOPLASTY Right 11/09/2023   Procedure: ANGIOPLASTY, USING PATCH GRAFT, USING XENOSURE 1X14CM;  Surgeon: [name], MD;  Location: [facility] OR;  Service: Vascular;  Laterality: Right;   PERIPHERAL VASCULAR ATHERECTOMY Right 04/27/2017   Procedure: PERIPHERAL VASCULAR ATHERECTOMY;  Surgeon: Avanell Leigh, MD;  Location: [facility] INVASIVE CV LAB;  Service: Cardiovascular;  Laterality: Right;  Iliac   PERIPHERAL VASCULAR INTERVENTION Right 04/27/2017   Procedure: PERIPHERAL VASCULAR INTERVENTION;  Surgeon: Avanell Leigh, MD;  Location: [facility];  Service: Cardiovascular;  Laterality: Right;  Iliac   Family History  Problem Relation Age of Onset   Cancer Mother        small cell lung cancer   Cancer Father        unsure type   Colon cancer Neg Hx    Rectal cancer Neg Hx    Heart disease Neg Hx    Pancreatic cancer Neg Hx    Stomach cancer Neg Hx    Esophageal cancer Neg Hx    Social  History   Socioeconomic History   Marital status: Divorced    Spouse name: Not on file   Number of children: 1   Years of education: Not on file   Highest education level: Associate degree: occupational, Scientist, product/process development, or vocational program  Occupational History   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  He is down to 2 cigarettes a day.  Vaping Use   Vaping status: Never Used  Substance and Sexual Activity   Alcohol use: Yes    Alcohol/week: 2.0 standard drinks of alcohol    Types: 2 Standard drinks or equivalent per week    Comment: Social   Drug use: Yes    Frequency: 1.0 times per week    Types: Oxycodone    Sexual activity: Not Currently     Birth control/protection: None  Other Topics Concern   Not on file  Social History Narrative   Not on file   Social Drivers of Health   Financial Resource Strain: Low Risk  (11/13/2023)   Overall Financial Resource Strain (CARDIA)    Difficulty of Paying Living Expenses: Not hard at all  Recent Concern: Financial Resource Strain - Medium Risk (10/14/2023)   Overall Financial Resource Strain (CARDIA)    Difficulty of Paying Living Expenses: Somewhat hard  Food Insecurity: No Food Insecurity (11/13/2023)   Hunger Vital Sign    Worried About Running Out of Food in the Last Year: Never true    Ran Out of Food in the Last Year: Never true  Transportation Needs: No Transportation Needs (11/13/2023)   PRAPARE - Administrator, Civil Service (Medical): No    Lack of Transportation (Non-Medical): No  Physical Activity: Inactive (11/13/2023)   Exercise Vital Sign    Days of Exercise per Week: 0 days    Minutes of Exercise per Session: 0 min  Stress: No Stress Concern Present (11/13/2023)   [employer] of Occupational Health - Occupational Stress Questionnaire    Feeling of Stress : Only a little  Social Connections: Socially Isolated (11/13/2023)   Social Connection and Isolation Panel [NHANES]    Frequency of Communication with Friends and Family: More than three times a week    Frequency of Social Gatherings with Friends and Family: Once a week    Attends Religious Services: Never    Database administrator or Organizations: No    Attends Banker Meetings: Never    Marital Status: Widowed    Tobacco Counseling Ready to quit: Yes Counseling given: Not Answered Tobacco comments: Patient is quitting on his own.  He is down to 2 cigarettes a day.    Clinical Intake:  Pre-visit preparation completed: Yes  Pain : No/denies pain     Nutritional Risks: None Diabetes: No  Lab Results  Component Value Date   HGBA1C 5.9 (H) 07/06/2023   HGBA1C 6.0 (H)  12/25/2022   HGBA1C 6.0 (H) 05/20/2022     How often do you need to have someone help you when you read instructions, pamphlets, or other written materials from your doctor or pharmacy?: 1 - Never  Interpreter Needed?: No  Information entered by :: NAllen LPN   Activities  of Daily Living     11/13/2023   10:33 AM 11/03/2023    1:52 PM  In your present state of health, do you have any difficulty performing the following activities:  Hearing? 0   Vision? 0   Difficulty concentrating or making decisions? 1   Walking or climbing stairs? 1   Dressing or bathing? 1   Doing errands, shopping? 1 0  Comment not driving right now   Preparing Food and eating ? Y   Using the Toilet? N   In the past six months, have you accidently leaked urine? Y   Comment since day after operation   Do you have problems with loss of bowel control? N   Managing your Medications? N   Managing your Finances? N   Housekeeping or managing your Housekeeping? Y     Patient Care Team: [name], MD as PCP - General (Family Medicine) Lei Pump, MD as PCP - Electrophysiology (Cardiology) Avanell Leigh, MD as PCP - Cardiology (Cardiology) Avanell Leigh, MD as Consulting Physician (Cardiology) Tobin Forts, MD as Consulting Physician (Gastroenterology) Devin Foerster, MD as Consulting Physician (Ophthalmology) Monika Annas, [facility] (Inactive) as Pharmacist (Pharmacist)  Indicate any recent Medical Services you may have received from other than [facility] providers in the past year (date may be approximate).     Assessment:    This is a routine wellness examination for Bradley Ferguson.  Hearing/Vision screen Hearing Screening - Comments:: Denies hearing issues Vision Screening - Comments:: Regular eye exams, [facility]   Goals Addressed             This Visit's Progress    Patient Stated       11/13/2023, wants to be able to walk through a store       Depression Screen      11/13/2023   11:37 AM 11/12/2023   12:15 PM 10/14/2023   10:44 AM 12/27/2022   10:16 PM 08/26/2022   12:28 PM 11/07/2021   11:33 AM 05/20/2021    8:42 PM  PHQ 2/9 Scores  PHQ - 2 Score 1 1 2  0 4 1 2   PHQ- 9 Score 5  8  8 5 9   Exception Documentation     Patient refusal      Fall Risk     11/13/2023   10:33 AM 11/12/2023   12:15 PM 10/14/2023   10:44 AM 12/27/2022   10:15 PM 08/26/2022   12:27 PM  Fall Risk   Falls in the past year? 1 1 1  0 1  Number falls in past yr: 0 1 1  1   Injury with Fall? 0 0 1  1  Risk for fall due to : Medication side effect;Impaired mobility  No Fall Risks No Fall Risks Impaired mobility;Impaired balance/gait;History of fall(s)  Follow up Falls prevention discussed;Falls evaluation completed  Falls evaluation completed Education provided;Falls prevention discussed;Falls evaluation completed Falls evaluation completed;Falls prevention discussed    MEDICARE RISK AT HOME:  Medicare Risk at Home Any stairs in or around the home?: (Patient-Rptd) Yes If so, are there any without handrails?: (Patient-Rptd) No Home free of loose throw rugs in walkways, pet beds, electrical cords, etc?: (Patient-Rptd) Yes Adequate lighting in your home to reduce risk of falls?: (Patient-Rptd) Yes Life alert?: (Patient-Rptd) No Use of a cane, walker or w/c?: (Patient-Rptd) Yes Grab bars in the bathroom?: No Shower chair or bench in shower?: (Patient-Rptd) No Elevated toilet seat or a handicapped toilet?: No  TIMED UP AND GO:  Was the test performed?  No  Cognitive Function: 6CIT completed    05/08/2021    3:03 PM  MMSE - Mini Mental State Exam  Orientation to time 5  Orientation to Place 5  Registration 3  Attention/ Calculation 5  Recall 2  Language- name 2 objects 2  Language- repeat 1  Language- follow 3 step command 3  Language- read & follow direction 1  Write a sentence 1  Copy design 1  Total score 29        11/13/2023   11:39 AM  6CIT Screen  What Year? 0  points  What month? 0 points  What time? 0 points  Count back from 20 0 points  Months in reverse 0 points  Repeat phrase 0 points  Total Score 0 points    Immunizations Immunization History  Administered Date(s) Administered   Influenza Whole 04/30/2010   Influenza, High Dose Seasonal PF 03/20/2017, 03/29/2018, 04/06/2019, 04/30/2020, 05/08/2021, 03/31/2023   Moderna SARS-COV2 Booster Vaccination 05/12/2020   Moderna Sars-Covid-2 Vaccination 05/12/2020   PFIZER Comirnaty(Gray Top)Covid-19 Tri-Sucrose Vaccine 03/20/2022   PFIZER(Purple Top)SARS-COV-2 Vaccination 08/04/2019, 08/29/2019   Pfizer Covid-19 Vaccine Bivalent Booster 63yrs & up 05/11/2021   Pneumococcal Conjugate-13 02/05/2017   Pneumococcal Polysaccharide-23 04/30/2010, 03/29/2018   Td 06/30/2013   Zoster Recombinant(Shingrix) 02/09/2023    Screening Tests Health Maintenance  Topic Date Due   Lung Cancer Screening  06/04/2022   COVID-19 Vaccine (6 - 2024-25 season) 03/01/2023   Colonoscopy  08/02/2023   Zoster Vaccines- Shingrix (2 of 2) 01/13/2024 (Originally 04/06/2023)   DTaP/Tdap/Td (2 - Tdap) 10/13/2024 (Originally 07/01/2023)   INFLUENZA VACCINE  01/29/2024   Medicare Annual Wellness (AWV)  11/12/2024   Pneumonia Vaccine 69+ Years old  Completed   Hepatitis C Screening  Completed   HPV VACCINES  Aged Out   Meningococcal B Vaccine  Aged Out    Health Maintenance  Health Maintenance Due  Topic Date Due   Lung Cancer Screening  06/04/2022   COVID-19 Vaccine (6 - 2024-25 season) 03/01/2023   Colonoscopy  08/02/2023   Health Maintenance Items Addressed: Will schedule colonoscopy when feeling better. Placed referral for lung screening  Additional Screening:  Vision Screening: Recommended annual ophthalmology exams for early detection of glaucoma and other disorders of the eye.  Dental Screening: Recommended annual dental exams for proper oral hygiene  Community Resource Referral / Chronic Care  Management: CRR required this visit?  No   CCM required this visit?  No   Plan:    I have personally reviewed and noted the following in the patient's chart:   Medical and social history Use of alcohol, tobacco or illicit drugs  Current medications and supplements including opioid prescriptions. Patient is currently taking opioid prescriptions. Information provided to patient regarding non-opioid alternatives. Patient advised to discuss non-opioid treatment plan with their provider. Functional ability and status Nutritional status Physical activity Advanced directives List of other physicians Hospitalizations, surgeries, and ER visits in previous 12 months Vitals Screenings to include cognitive, depression, and falls Referrals and appointments  In addition, I have reviewed and discussed with patient certain preventive protocols, quality metrics, and best practice recommendations. A written personalized care plan for preventive services as well as general preventive health recommendations were provided to patient.   Areatha Beecham, LPN   [id number]   After Visit Summary: (MyChart) Due to this being a telephonic visit, the after visit summary with patients personalized plan was offered to  patient via MyChart   Notes: Nothing significant to report at this time.

## 2023-11-17 NOTE — Discharge Summary (Signed)
 Bypass Discharge Summary Patient ID: Bradley Ferguson [id number] 73 y.o. October 19, 1950  Admit date: 11/09/2023  Discharge date and time: 11/11/2023  1:29 PM   Admitting Physician: [name], MD   Discharge Physician: Jimmye Moulds, MD  Admission Diagnoses: Atherosclerosis of native artery of right lower extremity with rest pain [facility]) [I70.221] Critical limb ischemia of right lower extremity (HCC) [I70.221] Atherosclerosis of lower extremity with claudication (HCC) [I70.219]  Discharge Diagnoses: Atherosclerosis of native artery of right lower extremity with rest pain (HCC) [I70.221] Critical limb ischemia of right lower extremity (HCC) [I70.221] Atherosclerosis of lower extremity with claudication (HCC) [I70.219]  Admission Condition: fair  Discharged Condition: good  Indication for Admission: 73 year old male seen with critical limb ischemia of the right lower extremity with an Achilles wound from rubbing his shoe.  He does have evidence of a high-grade right common femoral artery stenosis with proximal SFA occlusion.  He presents for common femoral endarterectomy and possible bypass.  An assistant was needed given the complexity of the case and also for the endarterectomy and patch angioplasty.   Hospital Course: Bradley Ferguson was admitted on 11/09/23 and underwent Right common femoral endarterectomy including profundoplasty and endarterectomy of the proximal SFA with bovine pericardial patch angioplasty by Dr. [name]. He tolerated the procedure well and was taken to the recovery room in stable condition. He progressed well post operatively and was transferred to the 4E floor.   POD#1 he did well overnight. Pain well controlled. Right groin incision without hematoma. Brisk DP/PT signals in right foot. Continue on Aspirin  and statin. Xarelto  held. PT/ OT evaluated patient and PT recommended Home health services. Cleared by OT. Home health PT arranged with [facility].   POD#2,  continued to progress well. Some intermittent sharp pains in right groin/ thigh. Right groin incision intact and well appearing. Brisk doppler signals in right foot. Encouraged to mobilize as tolerated. He remained stable for discharge home. Instructed to continue his aspirin  and statin and restart his home dose of Xarelto  on 5/15. He has follow up arranged in our office in 2-3 weeks for incision check.   Consults: None  Treatments: antibiotics: Ancef , analgesia: Morphine  and oxycodone , anticoagulation: ASA and LMW heparin , and surgery: Right common femoral endarterectomy including profundoplasty and endarterectomy of the proximal SFA with bovine pericardial patch angioplasty    Disposition: Discharge disposition: 01-Home or Self Care       - For VQI Registry use ---  Post-op:  Wound infection: No  Graft infection: No  Transfusion: No  If yes, 0 units given New Arrhythmia: No Patency judged by: Galerius.Gant ] Dopper only, [ ]  Palpable graft pulse, [ ]  Palpable distal pulse, [ ]  ABI inc. > 0.15, [ ]  Duplex D/C Ambulatory Status: Ambulatory  Complications: MI: [ X] No, [ ]  Troponin only, [ ]  EKG or Clinical CHF: No Resp failure: [ X] none, [ ]  Pneumonia, [ ]  Ventilator Chg in renal function: [ X] none, [ ]  Inc. Cr > 0.5, [ ]  Temp. Dialysis, [ ]  Permanent dialysis Stroke: [ X] None, [ ]  Minor, [ ]  Major Return to OR: No  Reason for return to OR: [ ]  Bleeding, [ ]  Infection, [ ]  Thrombosis, [ ]  Revision  Discharge medications: Statin use:  Yes ASA use:  Yes Plavix  use:  No  for medical reason not indicated Beta blocker use: No  for medical reason not indicated Coumadin use: No  for medical reason not indicated    Patient Instructions:  Allergies as of  11/11/2023       Reactions   Cymbalta  [duloxetine  Hcl]    Effexor [venlafaxine]    Feels bad   Wellbutrin  [bupropion ]    sleepy   Zoloft [sertraline Hcl]    Felt bad        Medication List     TAKE these medications     albuterol  108 (90 Base) MCG/ACT inhaler Commonly known as: VENTOLIN  HFA Use  2 inhalations  15 minutes apart  every 4 hours  to rescue Asthma Attack   aspirin  EC 81 MG tablet Take 81 mg by mouth daily. Swallow whole.   atorvastatin  80 MG tablet Commonly known as: LIPITOR  Take 1 tablet Daily for Cholesterol What changed:  how much to take how to take this when to take this   Cholecalciferol  250 MCG (10000 UT) Caps Take 10,000 Units by mouth every evening.   dofetilide  500 MCG capsule Commonly known as: TIKOSYN  TAKE 1 CAPSULE BY MOUTH 2 TIMES DAILY.   gabapentin  300 MG capsule Commonly known as: Neurontin  Take 1 capsule 3 x /day for Neuropathy Pain   ketoconazole  2 % cream Commonly known as: NIZORAL  Apply 1 Application topically daily as needed for irritation.   oxyCODONE  5 MG immediate release tablet Commonly known as: Oxy IR/ROXICODONE  Take 1 tablet (5 mg total) by mouth every 6 (six) hours as needed for severe pain (pain score 7-10).   pyridOXINE 100 MG tablet Commonly known as: VITAMIN B6 Take 100 mg by mouth every evening.   rivaroxaban  20 MG Tabs tablet Commonly known as: Xarelto  Take 1 tablet by mouth daily to Prevent Blood Clots What changed:  how much to take how to take this when to take this   triamcinolone  cream 0.5 % Commonly known as: KENALOG  Apply 1 Application topically daily as needed (skin irritation).               Discharge Care Instructions  (From admission, onward)           Start     Ordered   11/11/23 0000  Discharge wound care:       Comments: Keep dry for 24 hours. You can then wash with mild soap and water , pat dry. Do not soak in bathtub   11/11/23 1234           Activity: activity as tolerated, no driving while on analgesics, and no heavy lifting for 6 weeks Diet: regular diet and low fat, low cholesterol diet Wound Care: wash incision with mild soap and water , pat dry. Do not soak in bathtub  Follow-up with  [facility] in 2-3 weeks.  Signed: Deneen Finical 11/17/2023 12:18 PM

## 2023-11-20 ENCOUNTER — Encounter: Payer: Self-pay | Admitting: Family Medicine

## 2023-11-25 IMAGING — CT CT CHEST LUNG CANCER SCREENING LOW DOSE W/O CM
1 series · 10 of 10 positions shown, 13 images · non-contrast
Comparison: 01/19/2020 and 07/28/2019.

CLINICAL DATA: Current smoker, 51 pack-year history.

EXAM:
CT CHEST WITHOUT CONTRAST LOW-DOSE FOR LUNG CANCER SCREENING
TECHNIQUE: Multidetector CT imaging of the chest was performed following the
standard protocol without IV contrast.

[ct lung segmentation data · axial · 0.76mm/px · z∈[-348,-348]mm · 10 of 353 frames shown]
[frame 1/353  mediastinal]
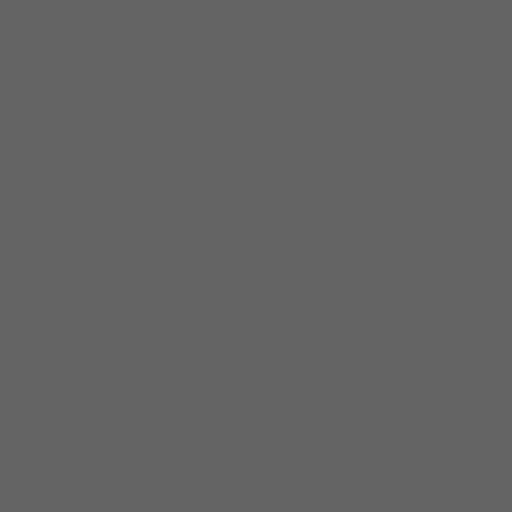
[frame 1/353  lung]
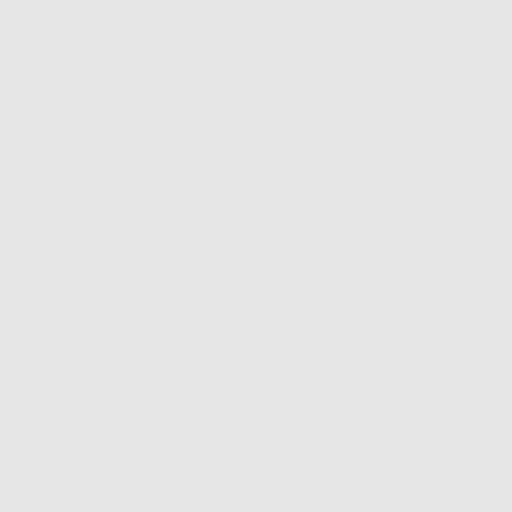
[frame 40/353  lung]
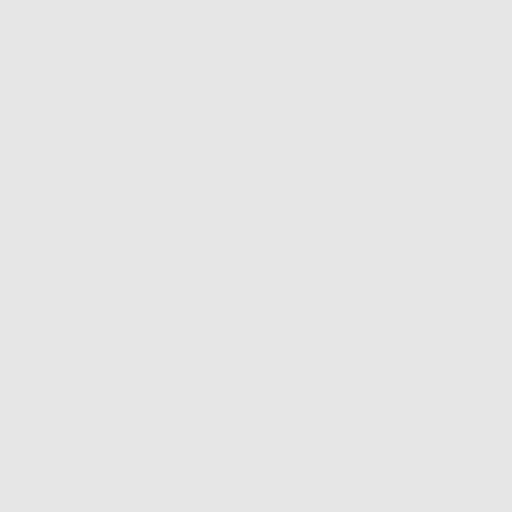
[frame 79/353  lung]
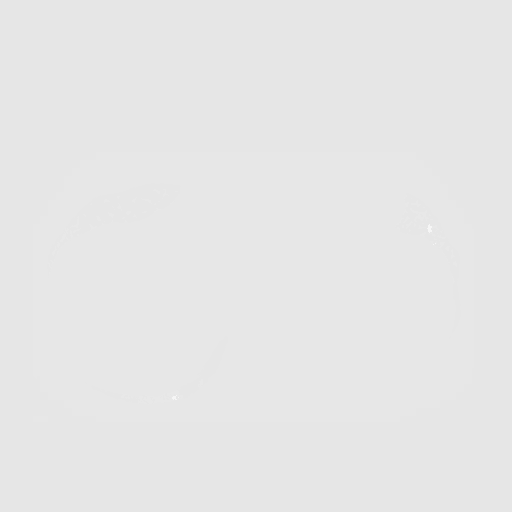
[frame 118/353  lung]
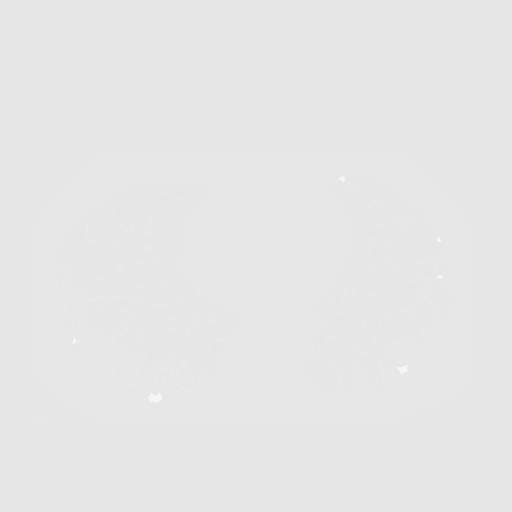
[frame 157/353  mediastinal]
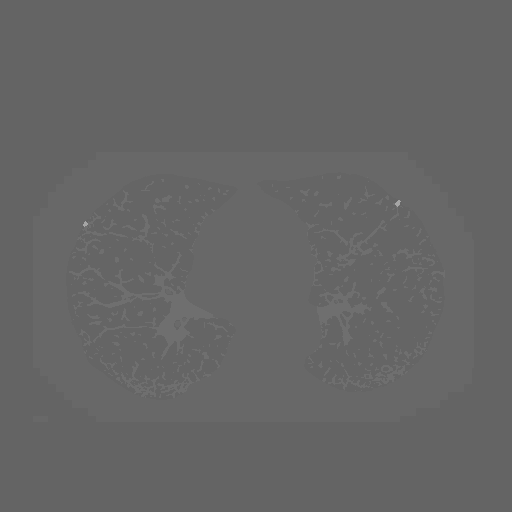
[frame 157/353  lung]
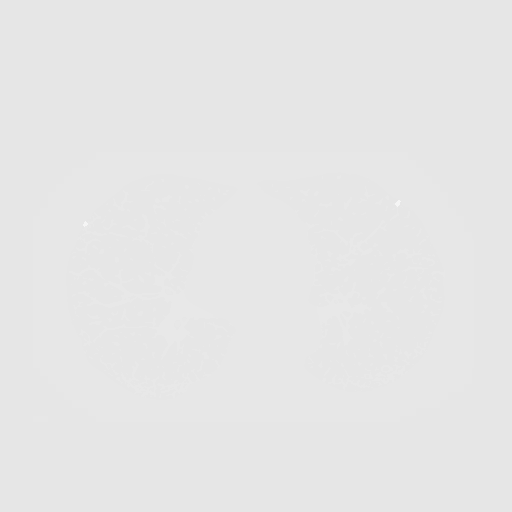
[frame 196/353  lung]
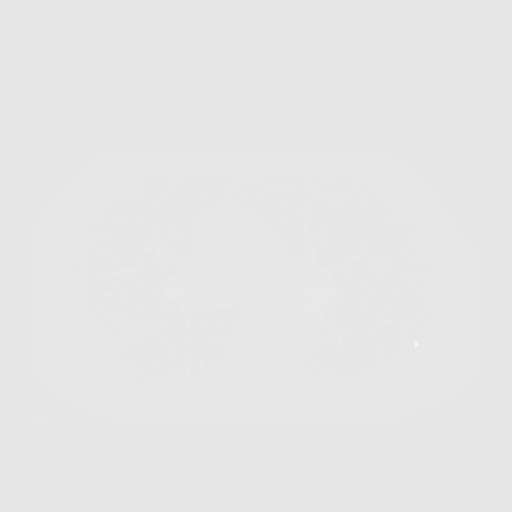
[frame 235/353  lung]
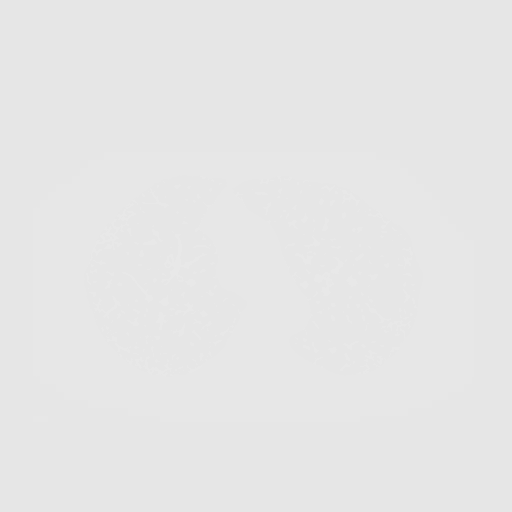
[frame 274/353  lung]
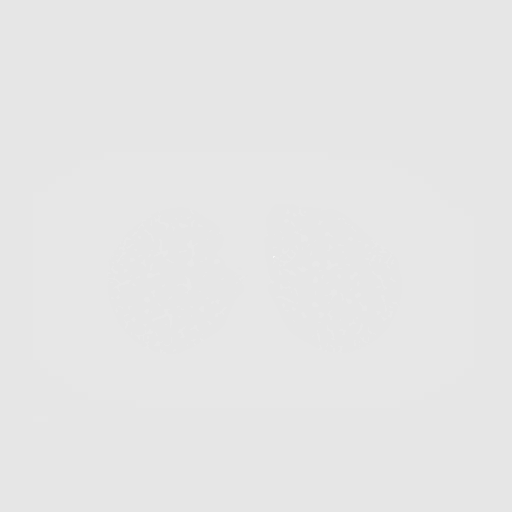
[frame 313/353  mediastinal]
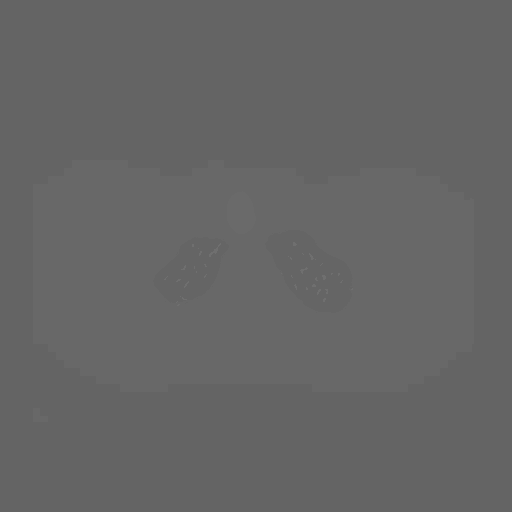
[frame 313/353  lung]
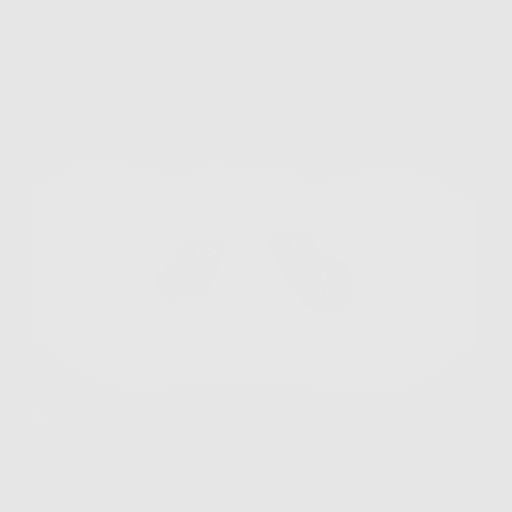
[frame 353/353  lung]
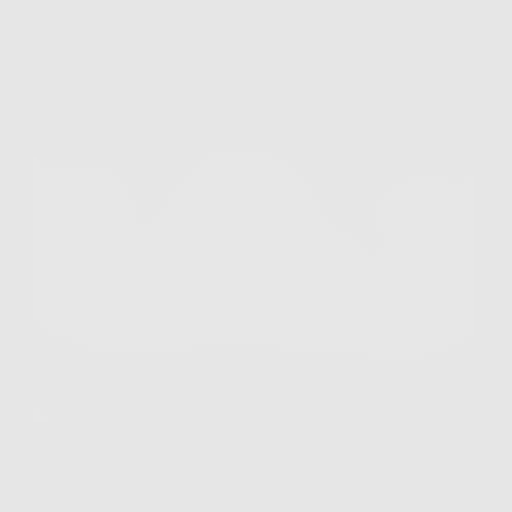

[10 of 10 positions shown; findings below may reference images not displayed]

FINDINGS: Cardiovascular: Atherosclerotic calcification of the aorta, aortic
valve and coronary arteries. Heart size normal. No pericardial
effusion.

Mediastinum/Nodes: No pathologically enlarged mediastinal or
axillary lymph nodes. Hilar regions are difficult to definitively
evaluate without IV contrast. Esophagus is grossly unremarkable.

Lungs/Pleura: Centrilobular and paraseptal emphysema. Peripheral and
basilar subpleural reticular densities, ground-glass and
bronchiectasis/bronchiolectasis, similar to 01/19/2020. No pleural
fluid. Airway is unremarkable. Pulmonary nodules measure 8.0 mm or
less in size, similar. No new or suspicious pulmonary nodules. No
pleural fluid. Airway is unremarkable.

Upper Abdomen: Visualized portions of the liver, gallbladder,
adrenal glands, kidneys, spleen, pancreas, stomach and bowel are
grossly unremarkable.

Musculoskeletal: No worrisome lytic or sclerotic lesions.
IMPRESSION: 1. Lung-RADS 2, benign appearance or behavior. Continue annual
screening with low-dose chest CT without contrast in 12 months.
2. Peripheral basilar predominant pulmonary fibrosis, similar to
01/19/2020. Findings were categorized as probable usual interstitial
pneumonitis on 07/28/2019.
3. Aortic atherosclerosis (G4O8D-T7D.D). Coronary artery
calcification.
4.  Emphysema (G4O8D-EWC.1).

## 2023-11-30 ENCOUNTER — Other Ambulatory Visit: Payer: Self-pay | Admitting: Family Medicine

## 2023-11-30 ENCOUNTER — Telehealth: Payer: Self-pay | Admitting: Family Medicine

## 2023-11-30 ENCOUNTER — Ambulatory Visit: Payer: Self-pay

## 2023-11-30 DIAGNOSIS — I480 Paroxysmal atrial fibrillation: Secondary | ICD-10-CM

## 2023-11-30 NOTE — Telephone Encounter (Signed)
 Copied From CRM [id number]. Reason for Triage: Patient will be out of his xarelto  in the next couple of days. He is getting medication through mail order and will be out by the time they can deliver the medication.   Nurse attempted to call pt: no answer: left voicemail to return call and will be routing chart to PCP office

## 2023-11-30 NOTE — Telephone Encounter (Signed)
 Correction: Patient is running low and has been back and forth with his pharmacy for a few weeks with trying to get this medication filled.    Copied from CRM [id number]. Topic: Clinical - Medication Refill >> Nov 30, 2023  5:14 PM [place] J wrote: Medication: rivaroxaban  (XARELTO ) 20 MG TABS tablet  Has the patient contacted their pharmacy? Yes (Agent: If no, request that the patient contact the pharmacy for the refill. If patient does not wish to contact the pharmacy document the reason why and proceed with request.) (Agent: If yes, when and what did the pharmacy advise?) Pharmacy stated that they reached out for a refill but I do not see any requests from them.  This is the patient's preferred pharmacy:  [facility], [place] - [address] [place] [address] [place] [address] Phone: [phone] Fax: [phone]  Is this the correct pharmacy for this prescription? Yes If no, delete pharmacy and type the correct one.   Has the prescription been filled recently? No  Is the patient out of the medication? Yes  Has the patient been seen for an appointment in the last year OR does the patient have an upcoming appointment? Yes  Can we respond through MyChart? Yes  Agent: Please be advised that Rx refills may take up to 3 business days. We ask that you follow-up with your pharmacy. >> Nov 30, 2023  5:19 PM [place] J wrote: Correction: Patient is running low and has been back and forth with his pharmacy for a few weeks with trying to get this medication filled.

## 2023-11-30 NOTE — Telephone Encounter (Unsigned)
 Copied from CRM [id number]. Topic: Clinical - Medication Refill >> Nov 30, 2023  5:14 PM [place] J wrote: Medication: rivaroxaban  (XARELTO ) 20 MG TABS tablet  Has the patient contacted their pharmacy? Yes (Agent: If no, request that the patient contact the pharmacy for the refill. If patient does not wish to contact the pharmacy document the reason why and proceed with request.) (Agent: If yes, when and what did the pharmacy advise?) Pharmacy stated that they reached out for a refill but I do not see any requests from them.  This is the patient's preferred pharmacy:  [facility], [place] - [address] [place] [address] [place] [address] Phone: [phone] Fax: [phone]  Is this the correct pharmacy for this prescription? Yes If no, delete pharmacy and type the correct one.   Has the prescription been filled recently? No  Is the patient out of the medication? Yes  Has the patient been seen for an appointment in the last year OR does the patient have an upcoming appointment? Yes  Can we respond through MyChart? Yes  Agent: Please be advised that Rx refills may take up to 3 business days. We ask that you follow-up with your pharmacy.

## 2023-12-01 ENCOUNTER — Ambulatory Visit: Attending: Vascular Surgery | Admitting: Physician Assistant

## 2023-12-01 VITALS — BP 143/88 | HR 71 | Temp 98.7°F | Resp 18 | Ht 73.0 in | Wt 161.7 lb

## 2023-12-01 DIAGNOSIS — I70221 Atherosclerosis of native arteries of extremities with rest pain, right leg: Secondary | ICD-10-CM

## 2023-12-01 MED ORDER — RIVAROXABAN 20 MG PO TABS: 20.0000 mg | ORAL_TABLET | Freq: Every day | ORAL | 6 refills | Status: DC

## 2023-12-01 NOTE — Progress Notes (Signed)
 POST OPERATIVE OFFICE NOTE    CC:  F/u for surgery  HPI:  Bradley Ferguson is a 73 y.o. male who is here for postop visit.  He recently underwent right common femoral endarterectomy and proximal SFA endarterectomy with bovine pericardial patch angioplasty on 11/09/2023 by Dr. [name].  This was done for critical limb ischemia with a right Achilles wound.    He returns today for follow-up.  He says he is doing well at today's visit.  He believes that his right groin incision is healing well.  He denies any drainage, redness, or tenderness.  He denies any rest pain in the right foot.  He says that his right Achilles ulceration is slowly getting smaller.  Allergies  Allergen Reactions   Cymbalta  [Duloxetine  Hcl]    Effexor [Venlafaxine]     Feels bad   Wellbutrin  [Bupropion ]     sleepy   Zoloft [Sertraline Hcl]     Felt bad    Current Outpatient Medications  Medication Sig Dispense Refill   albuterol  (VENTOLIN  HFA) 108 (90 Base) MCG/ACT inhaler Use  2 inhalations  15 minutes apart  every 4 hours  to rescue Asthma Attack 48 g 3   aspirin  EC 81 MG tablet Take 81 mg by mouth daily. Swallow whole.     atorvastatin  (LIPITOR ) 80 MG tablet Take 1 tablet Daily for Cholesterol (Patient taking differently: Take 80 mg by mouth every evening. Take 1 tablet Daily for Cholesterol) 90 tablet 0   Cholecalciferol  250 MCG (10000 UT) CAPS Take 10,000 Units by mouth every evening.     dofetilide  (TIKOSYN ) 500 MCG capsule TAKE 1 CAPSULE BY MOUTH 2 TIMES DAILY. 180 capsule 3   gabapentin  (NEURONTIN ) 300 MG capsule Take 1 capsule 3 x /day for Neuropathy Pain 270 capsule 3   ketoconazole  (NIZORAL ) 2 % cream Apply 1 Application topically daily as needed for irritation.     oxyCODONE  (OXY IR/ROXICODONE ) 5 MG immediate release tablet Take 1 tablet (5 mg total) by mouth every 6 (six) hours as needed for severe pain (pain score 7-10). 30 tablet 0   pyridOXINE (VITAMIN B-6) 100 MG tablet Take 100 mg by mouth every  evening.     rivaroxaban  (XARELTO ) 20 MG TABS tablet Take 1 tablet (20 mg total) by mouth daily with supper. 30 tablet 6   triamcinolone  cream (KENALOG ) 0.5 % Apply 1 Application topically daily as needed (skin irritation).     No current facility-administered medications for this visit.     ROS:  See HPI  Physical Exam:   Incision: Right groin incision well-healed without erythema, swelling, or drainage Extremities: Brisk right DP/PT Doppler signals.  Healing right Achilles ulceration Neuro: Intact motor and sensation of right lower extremity      Assessment/Plan:  This is a 73 y.o. male who is here for postop visit  - The patient recently underwent right common femoral endarterectomy and proximal SFA endarterectomy for critical limb ischemia with a right Achilles ulceration - His right groin incision is well-healed without signs of infection or hematoma -His right lower extremity is well-perfused with brisk DP/PT Doppler signals -His right Achilles ulceration is healing without signs of infection.  I have recommended that he continue to keep this area clean and dry. - He also has a very small left Achilles ulceration, which is stable - He will continue his daily aspirin , Xarelto , and statin.  He can follow-up with our office in 5 to 6 weeks with right lower extremity arterial duplex and  ABIs   Deneise Finlay, PA-C [facility] [phone]   Clinic MD:  Edgardo Goodwill

## 2023-12-03 ENCOUNTER — Other Ambulatory Visit: Payer: Self-pay | Admitting: *Deleted

## 2023-12-03 DIAGNOSIS — I70221 Atherosclerosis of native arteries of extremities with rest pain, right leg: Secondary | ICD-10-CM

## 2023-12-25 ENCOUNTER — Encounter: Payer: Self-pay | Admitting: Podiatry

## 2023-12-25 ENCOUNTER — Ambulatory Visit: Admitting: Podiatry

## 2023-12-25 DIAGNOSIS — B351 Tinea unguium: Secondary | ICD-10-CM | POA: Diagnosis not present

## 2023-12-25 DIAGNOSIS — M79674 Pain in right toe(s): Secondary | ICD-10-CM | POA: Diagnosis not present

## 2023-12-25 DIAGNOSIS — L84 Corns and callosities: Secondary | ICD-10-CM | POA: Diagnosis not present

## 2023-12-25 DIAGNOSIS — M79675 Pain in left toe(s): Secondary | ICD-10-CM | POA: Diagnosis not present

## 2023-12-25 DIAGNOSIS — D689 Coagulation defect, unspecified: Secondary | ICD-10-CM | POA: Diagnosis not present

## 2023-12-28 NOTE — Progress Notes (Signed)
 Subjective:   Patient ID: Bradley Ferguson, male   DOB: 73 y.o.   MRN: [id number]   HPI Patient presents with painful lesions of both feet fifth metatarsal head that gets sore on blood thinner and high risk for tried to trim these along with nail disease and elongation 1-5 both feet   ROS      Objective:  Physical Exam  Chronic keratotic lesions subfifth metatarsal bilateral with pain and elongated mycotic nail infection with pain 1-5 both feet     Assessment:  Chronic lesions bilateral with pain along with patient on blood thinner and nail disease painful 1-5 both feet     Plan:  Reviewed debrided nailbeds 1-5 both feet and debrided lesions subfifth metatarsal no iatrogenic bleeding reappoint routine care

## 2023-12-29 ENCOUNTER — Other Ambulatory Visit: Payer: Self-pay

## 2023-12-29 ENCOUNTER — Ambulatory Visit: Admitting: Family Medicine

## 2023-12-29 ENCOUNTER — Emergency Department (HOSPITAL_COMMUNITY)
Admission: EM | Admit: 2023-12-29 | Discharge: 2023-12-29 | Disposition: A | Discharge status: Home / Self Care | Attending: Emergency Medicine | Admitting: Emergency Medicine

## 2023-12-29 ENCOUNTER — Emergency Department (HOSPITAL_COMMUNITY)

## 2023-12-29 ENCOUNTER — Encounter (HOSPITAL_COMMUNITY): Payer: Self-pay

## 2023-12-29 ENCOUNTER — Telehealth: Payer: Self-pay | Admitting: Cardiovascular Disease

## 2023-12-29 ENCOUNTER — Ambulatory Visit: Payer: Self-pay

## 2023-12-29 VITALS — BP 122/68 | HR 92 | Temp 98.6°F | Ht 73.0 in | Wt 155.0 lb

## 2023-12-29 DIAGNOSIS — I480 Paroxysmal atrial fibrillation: Secondary | ICD-10-CM

## 2023-12-29 DIAGNOSIS — Z7901 Long term (current) use of anticoagulants: Secondary | ICD-10-CM | POA: Diagnosis not present

## 2023-12-29 DIAGNOSIS — Z5181 Encounter for therapeutic drug level monitoring: Secondary | ICD-10-CM | POA: Diagnosis not present

## 2023-12-29 DIAGNOSIS — J410 Simple chronic bronchitis: Secondary | ICD-10-CM | POA: Diagnosis not present

## 2023-12-29 DIAGNOSIS — I10 Essential (primary) hypertension: Secondary | ICD-10-CM | POA: Diagnosis not present

## 2023-12-29 DIAGNOSIS — Z79899 Other long term (current) drug therapy: Secondary | ICD-10-CM | POA: Insufficient documentation

## 2023-12-29 DIAGNOSIS — J90 Pleural effusion, not elsewhere classified: Secondary | ICD-10-CM | POA: Diagnosis not present

## 2023-12-29 DIAGNOSIS — J189 Pneumonia, unspecified organism: Secondary | ICD-10-CM

## 2023-12-29 DIAGNOSIS — I700 Atherosclerosis of aorta: Secondary | ICD-10-CM | POA: Diagnosis not present

## 2023-12-29 DIAGNOSIS — R042 Hemoptysis: Secondary | ICD-10-CM | POA: Diagnosis not present

## 2023-12-29 DIAGNOSIS — I4891 Unspecified atrial fibrillation: Secondary | ICD-10-CM | POA: Insufficient documentation

## 2023-12-29 DIAGNOSIS — R918 Other nonspecific abnormal finding of lung field: Secondary | ICD-10-CM | POA: Diagnosis not present

## 2023-12-29 DIAGNOSIS — R000 Tachycardia, unspecified: Secondary | ICD-10-CM | POA: Diagnosis not present

## 2023-12-29 DIAGNOSIS — R531 Weakness: Secondary | ICD-10-CM | POA: Diagnosis not present

## 2023-12-29 DIAGNOSIS — J439 Emphysema, unspecified: Secondary | ICD-10-CM | POA: Diagnosis not present

## 2023-12-29 DIAGNOSIS — J168 Pneumonia due to other specified infectious organisms: Secondary | ICD-10-CM | POA: Diagnosis not present

## 2023-12-29 DIAGNOSIS — J181 Lobar pneumonia, unspecified organism: Secondary | ICD-10-CM | POA: Insufficient documentation

## 2023-12-29 DIAGNOSIS — R079 Chest pain, unspecified: Secondary | ICD-10-CM | POA: Diagnosis not present

## 2023-12-29 DIAGNOSIS — Z7982 Long term (current) use of aspirin: Secondary | ICD-10-CM | POA: Diagnosis not present

## 2023-12-29 DIAGNOSIS — J849 Interstitial pulmonary disease, unspecified: Secondary | ICD-10-CM | POA: Diagnosis not present

## 2023-12-29 LAB — COMPREHENSIVE METABOLIC PANEL WITH GFR
ALT: 16 U/L (ref 0–44)
AST: 25 U/L (ref 15–41)
Albumin: 2.6 g/dL — ABNORMAL LOW (ref 3.5–5.0)
Alkaline Phosphatase: 66 U/L (ref 38–126)
Anion gap: 10 (ref 5–15)
BUN: 13 mg/dL (ref 8–23)
CO2: 23 mmol/L (ref 22–32)
Calcium: 8.9 mg/dL (ref 8.9–10.3)
Chloride: 101 mmol/L (ref 98–111)
Creatinine, Ser: 0.78 mg/dL (ref 0.61–1.24)
GFR, Estimated: >60 mL/min (ref >60)
Glucose, Bld: 125 mg/dL — ABNORMAL HIGH (ref 70–99)
Potassium: 3.7 mmol/L (ref 3.5–5.1)
Sodium: 134 mmol/L — ABNORMAL LOW (ref 135–145)
Total Bilirubin: 0.5 mg/dL (ref 0.0–1.2)
Total Protein: 6.3 g/dL — ABNORMAL LOW (ref 6.5–8.1)

## 2023-12-29 LAB — CBC WITH DIFFERENTIAL/PLATELET
Abs Immature Granulocytes: 0.05 10*3/uL (ref 0.00–0.07)
Basophils Absolute: 0.1 10*3/uL (ref 0.0–0.1)
Basophils Relative: 1 %
Eosinophils Absolute: 0.1 10*3/uL (ref 0.0–0.5)
Eosinophils Relative: 1 %
HCT: 33.5 % — ABNORMAL LOW (ref 39.0–52.0)
Hemoglobin: 10.6 g/dL — ABNORMAL LOW (ref 13.0–17.0)
Immature Granulocytes: 1 %
Lymphocytes Relative: 10 %
Lymphs Abs: 1 10*3/uL (ref 0.7–4.0)
MCH: 26.3 pg (ref 26.0–34.0)
MCHC: 31.6 g/dL (ref 30.0–36.0)
MCV: 83.1 fL (ref 80.0–100.0)
Monocytes Absolute: 0.8 10*3/uL (ref 0.1–1.0)
Monocytes Relative: 8 %
Neutro Abs: 8 10*3/uL — ABNORMAL HIGH (ref 1.7–7.7)
Neutrophils Relative %: 79 %
Platelets: 307 10*3/uL (ref 150–400)
RBC: 4.03 MIL/uL — ABNORMAL LOW (ref 4.22–5.81)
RDW: 14.4 % (ref 11.5–15.5)
WBC: 10.1 10*3/uL (ref 4.0–10.5)
nRBC: 0 % (ref 0.0–0.2)

## 2023-12-29 LAB — POCT INFLUENZA A/B
Influenza A, POC: NEGATIVE
Influenza B, POC: NEGATIVE

## 2023-12-29 LAB — URINALYSIS, ROUTINE W REFLEX MICROSCOPIC
Bacteria, UA: NONE SEEN
Bilirubin Urine: NEGATIVE
Glucose, UA: NEGATIVE mg/dL
Hgb urine dipstick: NEGATIVE
Ketones, ur: NEGATIVE mg/dL
Leukocytes,Ua: NEGATIVE
Nitrite: NEGATIVE
Protein, ur: NEGATIVE mg/dL
Specific Gravity, Urine: >1.046 — ABNORMAL HIGH (ref 1.005–1.030)
pH: 5 (ref 5.0–8.0)

## 2023-12-29 LAB — D-DIMER, QUANTITATIVE: D-Dimer, Quant: 3.42 ug{FEU}/mL — ABNORMAL HIGH (ref 0.00–0.50)

## 2023-12-29 LAB — POC COVID19 BINAXNOW: SARS Coronavirus 2 Ag: NEGATIVE

## 2023-12-29 LAB — MAGNESIUM: Magnesium: 2.1 mg/dL (ref 1.7–2.4)

## 2023-12-29 MED ORDER — DOFETILIDE 500 MCG PO CAPS
500.0000 ug | ORAL_CAPSULE | Freq: Once | ORAL | Status: AC
  Administered 2023-12-29: 500 ug via ORAL
  Filled 2023-12-29: qty 1

## 2023-12-29 MED ORDER — AMOXICILLIN-POT CLAVULANATE 875-125 MG PO TABS: 1.0000 | ORAL_TABLET | Freq: Two times a day (BID) | ORAL | 0 refills | Status: DC

## 2023-12-29 MED ORDER — RIVAROXABAN 10 MG PO TABS
20.0000 mg | ORAL_TABLET | Freq: Once | ORAL | Status: AC
  Administered 2023-12-29: 20 mg via ORAL
  Filled 2023-12-29: qty 2

## 2023-12-29 MED ORDER — IOHEXOL 350 MG/ML SOLN
75.0000 mL | Freq: Once | INTRAVENOUS | Status: AC | PRN
  Administered 2023-12-29: 75 mL via INTRAVENOUS

## 2023-12-29 MED ORDER — SODIUM CHLORIDE 0.9 % IV SOLN
1.0000 g | Freq: Once | INTRAVENOUS | Status: AC
  Administered 2023-12-29: 1 g via INTRAVENOUS
  Filled 2023-12-29: qty 10

## 2023-12-29 MED ORDER — SODIUM CHLORIDE 0.9 % IV SOLN
500.0000 mg | Freq: Once | INTRAVENOUS | Status: AC
  Administered 2023-12-29: 500 mg via INTRAVENOUS
  Filled 2023-12-29: qty 5

## 2023-12-29 NOTE — ED Notes (Signed)
 Patient transported to CT

## 2023-12-29 NOTE — ED Triage Notes (Signed)
 Patient BIB EMS from primary care office c/o intermittent hematuria that he has been self treating with xarelto . Patient does have a history of afib and upon EMS arrival patient was showing afib on monitor. Patient states that he has been experiencing weakness and sharp chest pain to the left chest when breathing. Patient also reports a cough that looks milky with red streaks. Patient is not currently complaining of any hematuria or pain. Patient also states he has not had an appetite.

## 2023-12-29 NOTE — Progress Notes (Signed)
 Assessment & Plan   Assessment/Plan:    Assessment & Plan Atrial Fibrillation and Flutter Recurrent atrial fibrillation and flutter with high risk for cardioembolic events due peripheral vascular disease, hyperlipidemia, and aortic atherosclerosis. Currently not on anticoagulation therapy, increasing risk for thromboembolic events. Experiencing left-sided chest pain, hemoptysis, and hematuria, potentially related to lack of anticoagulation. Informed of stroke and mortality risk without anticoagulation. Advised against private vehicle transportation due to risk of cardiac arrest or stroke. Patient's vehicle was not parked to his liking. He wished to go outside and park his car. Again, he was informed that it was safest to stay seated under observation. Against medical advise, he went outside of the process and stated I am not going to run away  - Call 911 for emergency transport to the hospital for further evaluation and management. - Discuss risks of not being on anticoagulation, including potential for stroke and other thromboembolic events.        There are no discontinued medications.  No follow-ups on file.        Subjective:   Encounter date: 12/29/2023  Bradley Ferguson is a 73 y.o. male who has ANXIETY DEPRESSION; TOBACCO ABUSE; RAYNAUDS SYNDROME; Attention deficit disorder; Dental caries; Bipolar I disorder (HCC); Atrial fibrillation (HCC); Mixed hyperlipidemia; Vitamin D  deficiency; Labile hypertension; BPH; Peripheral arterial disease (HCC); Visit for monitoring Tikosyn  therapy; Colon polyps; Acquired thrombophilia (HCC); Atherosclerosis of aorta (HCC) by Chest CT scan on 01/19/2020; Chronic obstructive pulmonary disease (HCC); Centrilobular emphysema (HCC); Interstitial pulmonary disease (HCC); Positive ANA (antinuclear antibody); TB lung, latent; Systemic scleroses (HCC); Right inguinal hernia; Essential hypertension; Corn of foot; Elevated LFTs; Hepatic steatosis; Elevated  coronary artery calcium  score; Critical limb ischemia of left lower extremity (HCC); Aortoiliac occlusive disease (HCC); Critical limb ischemia of right lower extremity (HCC); and Atherosclerosis of lower extremity with claudication (HCC) on their problem list..   He  has a past medical history of Anxiety, Atrial fibrillation (HCC), Blood transfusion without reported diagnosis, Cataract, Depression, Emphysema of lung (HCC), Heart murmur, Hyperlipidemia, Hypertension, PVD (peripheral vascular disease) (HCC), Raynaud's syndrome, Substance abuse (HCC), Systemic sclerosis (HCC), TB lung, latent (12/19/2019), and Transaminitis (06/18/2020).SABRA   He presents with chief complaint of Cough (Cough with some phlem and little blood; x1wk; ), Hematuria (On a off for the past 59mo; quick xerelto about 3wks ago), and Chest Pain (sharp pain in upper left chest; refer to Nurse triage notes entered today through telephone call/) .   Discussed the use of AI scribe software for clinical note transcription with the patient, who gave verbal consent to proceed.  History of Present Illness Bradley Ferguson is a 73 year old male with atrial fibrillation and COPD who presents with chest pain, hemoptysis, and hematuria.  He has been experiencing left-sided chest pain, which is intermittent and less severe than a few days ago. He has a history of atrial fibrillation and is currently experiencing atrial flutter. He is not on anticoagulation therapy at this time and is concerned about the relationship between his atrial fibrillation and the presence of blood in his lungs.  He is experiencing hemoptysis and hematuria. He is worried about the implications of these symptoms in relation to his atrial fibrillation and the potential for clots, especially given his history of peripheral vascular disease and hyperlipidemia.  His past medical history includes atrial fibrillation, COPD, peripheral vascular disease, and hyperlipidemia. He is a  current smoker and has atherosclerosis of the aorta. He mentions taking Tikosyn  for his atrial fibrillation.  During the review of symptoms, he confirms the presence of left-sided chest pain, hemoptysis, and hematuria. He is concerned about the financial implications of his medical care, particularly regarding ambulance costs.     ROS  Past Surgical History:  Procedure Laterality Date   ABDOMINAL AORTOGRAM W/LOWER EXTREMITY Left 07/09/2023   Procedure: ABDOMINAL AORTOGRAM W/LOWER EXTREMITY;  Surgeon: Court Dorn PARAS, MD;  Location: [facility];  Service: Cardiovascular;  Laterality: Left;   ATRIAL FIBRILLATION ABLATION     CATARACT EXTRACTION Right    COLONOSCOPY     ENDARTERECTOMY FEMORAL Left 09/02/2023   Procedure: LEFT LOWER EXTREMITY ANGIOGRAM WITH LEFT ILIAC  STENTING;  Surgeon: Gretta Lonni PARAS, MD;  Location: [facility] OR;  Service: Vascular;  Laterality: Left;   ENDARTERECTOMY FEMORAL Right 11/09/2023   Procedure: ENDARTERECTOMY, FEMORAL TO PROFUNDA;  Surgeon: Gretta Lonni PARAS, MD;  Location: [facility] OR;  Service: Vascular;  Laterality: Right;   EYE SURGERY     FEMORAL-POPLITEAL BYPASS GRAFT Left 09/02/2023   Procedure: LEFT FEMORAL ENDARTARECTOMY WITH PATCH ANGIOPLASTY;  Surgeon: Gretta Lonni PARAS, MD;  Location: [facility] OR;  Service: Vascular;  Laterality: Left;   FRACTURE SURGERY Left 1976   HERNIA REPAIR  2023   INGUINAL HERNIA REPAIR Right 03/23/2020   Procedure: OPEN RIGHT INGUINAL HERNIA REPAIR WITH MESH;  Surgeon: Gladis Cough, MD;  Location: [facility] OR;  Service: General;  Laterality: Right;   INSERTION OF MESH Right 03/23/2020   Procedure: INSERTION OF MESH;  Surgeon: Gladis Cough, MD;  Location: [facility] OR;  Service: General;  Laterality: Right;   LOWER EXTREMITY INTERVENTION N/A 04/27/2017   Procedure: LOWER EXTREMITY INTERVENTION;  Surgeon: Court Dorn PARAS, MD;  Location: [facility];  Service: Cardiovascular;  Laterality: N/A;   PATCH ANGIOPLASTY Right  11/09/2023   Procedure: ANGIOPLASTY, USING PATCH GRAFT, USING XENOSURE 1X14CM;  Surgeon: Gretta Lonni PARAS, MD;  Location: [facility] OR;  Service: Vascular;  Laterality: Right;   PERIPHERAL VASCULAR ATHERECTOMY Right 04/27/2017   Procedure: PERIPHERAL VASCULAR ATHERECTOMY;  Surgeon: Court Dorn PARAS, MD;  Location: [facility] INVASIVE CV LAB;  Service: Cardiovascular;  Laterality: Right;  Iliac   PERIPHERAL VASCULAR INTERVENTION Right 04/27/2017   Procedure: PERIPHERAL VASCULAR INTERVENTION;  Surgeon: Court Dorn PARAS, MD;  Location: [facility];  Service: Cardiovascular;  Laterality: Right;  Iliac    Outpatient Medications Prior to Visit  Medication Sig Dispense Refill   albuterol  (VENTOLIN  HFA) 108 (90 Base) MCG/ACT inhaler Use  2 inhalations  15 minutes apart  every 4 hours  to rescue Asthma Attack 48 g 3   atorvastatin  (LIPITOR ) 80 MG tablet Take 1 tablet Daily for Cholesterol (Patient taking differently: Take 80 mg by mouth every evening. Take 1 tablet Daily for Cholesterol) 90 tablet 0   Cholecalciferol  250 MCG (10000 UT) CAPS Take 10,000 Units by mouth every evening.     dofetilide  (TIKOSYN ) 500 MCG capsule TAKE 1 CAPSULE BY MOUTH 2 TIMES DAILY. 180 capsule 3   gabapentin  (NEURONTIN ) 300 MG capsule Take 1 capsule 3 x /day for Neuropathy Pain 270 capsule 3   ketoconazole  (NIZORAL ) 2 % cream Apply 1 Application topically daily as needed for irritation.     oxyCODONE  (OXY IR/ROXICODONE ) 5 MG immediate release tablet Take 1 tablet (5 mg total) by mouth every 6 (six) hours as needed for severe pain (pain score 7-10). 30 tablet 0   pyridOXINE (VITAMIN B-6) 100 MG tablet Take 100 mg by mouth every evening.     triamcinolone  cream (KENALOG )  0.5 % Apply 1 Application topically daily as needed (skin irritation).     aspirin  EC 81 MG tablet Take 81 mg by mouth daily. Swallow whole. (Patient not taking: Reported on 12/29/2023)     rivaroxaban  (XARELTO ) 20 MG TABS tablet Take 1 tablet (20 mg total) by mouth  daily with supper. (Patient not taking: Reported on 12/29/2023) 30 tablet 6   No facility-administered medications prior to visit.    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  He is down to 2 cigarettes a day.  Vaping Use   Vaping status: Never Used  Substance and Sexual Activity   Alcohol use: Yes    Alcohol/week: 2.0 standard drinks of alcohol    Types: 2 Standard drinks or equivalent per week    Comment: Social   Drug use: Yes    Frequency: 1.0 times per week    Types: Oxycodone    Sexual activity: Not Currently    Birth control/protection: None  Other Topics Concern   Not on file  Social History Narrative   Not on file   Social Drivers of Health   Financial Resource Strain: Medium Risk (12/29/2023)   Overall Financial Resource Strain (CARDIA)    Difficulty of Paying Living Expenses: Somewhat hard  Food Insecurity: Food Insecurity Present (12/29/2023)   Hunger Vital Sign    Worried About Running Out of Food in the Last Year: Sometimes true    Ran Out of Food in the Last Year: Sometimes true  Transportation Needs: No Transportation Needs (12/29/2023)   PRAPARE -  Administrator, Civil Service (Medical): No    Lack of Transportation (Non-Medical): No  Physical Activity: Inactive (12/29/2023)   Exercise Vital Sign    Days of Exercise per Week: 0 days    Minutes of Exercise per Session: Not on file  Stress: Stress Concern Present (12/29/2023)   [employer] of Occupational Health - Occupational Stress Questionnaire    Feeling of Stress: Rather much  Social Connections: Socially Isolated (12/29/2023)   Social Connection and Isolation Panel    Frequency of Communication with Friends and Family: More than three times a week    Frequency of Social Gatherings with Friends and Family: Once a week    Attends Religious Services: Never    Database administrator or Organizations: No    Attends Banker Meetings: Not on file    Marital  Status: Widowed  Intimate Partner Violence: Not At Risk (11/12/2023)   Humiliation, Afraid, Rape, and Kick questionnaire    Fear of Current or Ex-Partner: No    Emotionally Abused: No    Physically Abused: No    Sexually Abused: No                                                                                                  Objective:  Physical Exam: BP 122/68   Pulse 92   Temp 98.6 F (37 C)   Ht 6' 1 (1.854 m)   Wt 155 lb (70.3 kg)   SpO2 97%   BMI 20.45 kg/m    Physical Exam VITALS: P- 92, BP- 122/68 GENERAL: Alert, cooperative, well developed, no acute distress HEENT: Normocephalic, normal oropharynx, moist mucous membranes CHEST: Clear to auscultation bilaterally, No wheezes, rhonchi, or crackles CARDIOVASCULAR: Normal heart rate and rhythm, S1 and S2 normal without murmurs ABDOMEN: Soft, non-tender, non-distended, without organomegaly, Normal bowel sounds EXTREMITIES: No cyanosis or edema NEUROLOGICAL: Cranial nerves grossly intact, Moves all extremities without gross motor or sensory deficit   Physical Exam  VAS US  ABI WITH/WO TBI Result Date: 10/13/2023  LOWER EXTREMITY  DOPPLER STUDY Patient Name:  Bradley Ferguson  Date of Exam:   10/13/2023 Medical Rec #: [id number]       Accession #:    [id number] Date of Birth: 1950-12-09       Patient Gender: M Patient Age:   24 years Exam Location:  [facility] Procedure:      VAS US  ABI WITH/WO TBI Referring Phys: Elna Loud PA-C --------------------------------------------------------------------------------  Indications: Peripheral artery disease. Patient reports his left lower extremity              symptoms have improved since his procedure in 08/2023. Now, he              reports his right leg has symptoms similar to how the left leg use              to be. He states the right gets numb after walking less than five              minutes, with symptoms usually occurring from the right calf down.              He also reports bilateral cold feet. High Risk Factors: Hypertension, hyperlipidemia, current smoker.  Vascular Interventions: Successful diamondback orbital rotation arthrectomy, PT                         and covered stent at a subtotal calcified proximal right                         common iliac artery on 04/27/2017. Angioplasty and stent                         of left common and external iliac artery, and left  common femoral endarterectomy including profundoplasty                         and endarterectomy the proximal SFA with bovine                         pericardial patch angioplasty on 09/02/2023. Comparison Study: In 11/2021, a lower arterial Doppler showed an ABI of .55 on                   the right and .39 on the left. Performing Technologist: Nanetta Shad RVT  Examination Guidelines: A complete evaluation includes at minimum, Doppler waveform signals and systolic blood pressure reading at the level of bilateral brachial, anterior tibial, and posterior tibial arteries, when vessel segments are accessible. Bilateral testing is considered an integral part of a complete examination. Photoelectric  Plethysmograph (PPG) waveforms and toe systolic pressure readings are included as required and additional duplex testing as needed. Limited examinations for reoccurring indications may be performed as noted.  ABI Findings: +---------+------------------+-----+----------+--------+ Right    Rt Pressure (mmHg)IndexWaveform  Comment  +---------+------------------+-----+----------+--------+ Brachial 100                                       +---------+------------------+-----+----------+--------+ PTA      0                 0.00 absent             +---------+------------------+-----+----------+--------+ DP       34                0.28 monophasic         +---------+------------------+-----+----------+--------+ Great Toe0                 0.00 Absent             +---------+------------------+-----+----------+--------+ +---------+------------------+-----+----------+-------+ Left     Lt Pressure (mmHg)IndexWaveform  Comment +---------+------------------+-----+----------+-------+ Brachial 120                                      +---------+------------------+-----+----------+-------+ PTA      77                0.64 monophasic        +---------+------------------+-----+----------+-------+ DP       76                0.63 monophasic        +---------+------------------+-----+----------+-------+ Great Toe36                0.30 Abnormal          +---------+------------------+-----+----------+-------+ +-------+-----------+-----------+------------+------------+ ABI/TBIToday's ABIToday's TBIPrevious ABIPrevious TBI +-------+-----------+-----------+------------+------------+ Right  .28        0.0        .55         0.0          +-------+-----------+-----------+------------+------------+ Left   .64        .30        .39         0.0          +-------+-----------+-----------+------------+------------+ Right ABIs appear decreased compared to prior study on  02/23/2023. Left ABIs and TBIs appear increased compared to prior study on 08/26/20224. Right TBIs  appear essentially unchanged compared to prior study on 02/23/2023.  Summary: Right: Resting right ankle-brachial index indicates critical limb ischemia. The right toe-brachial index is abnormal. Right ABI .28 with severely abnormal monophasic waveform pattern at the ankle. Left: Resting left ankle-brachial index indicates moderate left lower extremity arterial disease. The left toe-brachial index is abnormal. Left ABI 0.64 with severely abnormal monophasic waveform pattern at the ankle. *See table(s) above for measurements and observations. See Aortoiliac duplex report. Suggest Peripheral Vascular Consult. Electronically signed by Erick Bergamo on 10/13/2023 at 6:19:17 PM.    Final    VAS US  AORTA/IVC/ILIACS Result Date: 10/13/2023 ABDOMINAL AORTA STUDY Patient Name:  Bradley Ferguson  Date of Exam:   10/13/2023 Medical Rec #: [id number]       Accession #:    [id number] Date of Birth: Nov 08, 1950       Patient Gender: M Patient Age:   46 years Exam Location:  [facility] Procedure:      VAS US  AORTA/IVC/ILIACS Referring Phys: [facility] --------------------------------------------------------------------------------  Indications: Peripheral arterial disease. Patient reports his left lower              extremity symptoms have improved since his procedure in 08/2023.              Now, he reports his right leg has symptoms similar to how the left              leg use to be. He states the right gets numb after walking less              than five minutes, with symptoms usually occurring from the right              calf down. He also reports bilateral cold feet.               Today's ABIs are .28 on the right and .64 on the left. Risk Factors: Hypertension, hyperlipidemia, current smoker. Vascular Interventions: Successful diamondback orbital rotation arthrectomy, PT                         and covered stent at a subtotal  calcified proximal right                         common iliac artery on 04/27/2017. Angioplasty and stent                         of left common and external iliac artery, and left                         common femoral endarterectomy including profundoplasty                         and endarterectomy the proximal SFA with bovine                         pericardial patch angioplasty on 09/02/2023.  Comparison Study: In 01/2023, an aortoiliac duplex showed a patent right common                   iliac artery stent without evidence of stenosis, and no                   evidence of stenosis in there right external iliac artery.  Velocities consistent with >50% stenosis in the left proximal                   common iliac artery followed by mid and distal occlusion, and                   >50% stenosis in the proximal left external iliac artery.                   Infrarenal mid abdominal aortic aneurysm, measuring 3.0 cm. Performing Technologist: Nanetta Shad RVT  Examination Guidelines: A complete evaluation includes B-mode imaging, spectral Doppler, color Doppler, and power Doppler as needed of all accessible portions of each vessel. Bilateral testing is considered an integral part of a complete examination. Limited examinations for reoccurring indications may be performed as noted.  Abdominal Aorta Findings: +-------------+-------+----------+----------+----------+--------+--------+ Location     AP (cm)Trans (cm)PSV (cm/s)Waveform  ThrombusComments +-------------+-------+----------+----------+----------+--------+--------+ Proximal     2.00   2.00      74                                   +-------------+-------+----------+----------+----------+--------+--------+ Mid          3.20   3.20      28                          fusiform +-------------+-------+----------+----------+----------+--------+--------+ Distal       2.30   2.30      17                                    +-------------+-------+----------+----------+----------+--------+--------+ RT EIA Prox                   121       monophasic                 +-------------+-------+----------+----------+----------+--------+--------+ RT EIA Mid                    89        monophasic                 +-------------+-------+----------+----------+----------+--------+--------+ RT EIA Distal                 40        monophasic                 +-------------+-------+----------+----------+----------+--------+--------+ IVC/Iliac Findings: +--------+------+--------+--------+   IVC   PatentThrombusComments +--------+------+--------+--------+ IVC Proxpatent                 +--------+------+--------+--------+   Right Stent(s): +-------------------+--------+--------+--------+--------+ Common Iliac ArteryPSV cm/sStenosisWaveformComments +-------------------+--------+--------+--------+--------+ Prox to Stent      57                               +-------------------+--------+--------+--------+--------+ Proximal Stent     68                               +-------------------+--------+--------+--------+--------+ Mid Stent          42                               +-------------------+--------+--------+--------+--------+  Distal Stent       46                               +-------------------+--------+--------+--------+--------+ Distal to Stent    36                               +-------------------+--------+--------+--------+--------+   Left Stent(s): +-------------------+--------+--------+----------+--------+ Common Iliac ArteryPSV cm/sStenosisWaveform  Comments +-------------------+--------+--------+----------+--------+ Prox to Stent      63              monophasic         +-------------------+--------+--------+----------+--------+ Proximal Stent     74              monophasic         +-------------------+--------+--------+----------+--------+ Mid Stent           76              monophasic         +-------------------+--------+--------+----------+--------+ Distal Stent       71              monophasic         +-------------------+--------+--------+----------+--------+ Distal to Stent    70              monophasic         +-------------------+--------+--------+----------+--------+  +---------------------+--------+--------+----------+--------+ External Iliac ArteryPSV cm/sStenosisWaveform  Comments +---------------------+--------+--------+----------+--------+ Prox to Stent        48              monophasic         +---------------------+--------+--------+----------+--------+ Proximal Stent       68              monophasic         +---------------------+--------+--------+----------+--------+ Mid Stent            76              monophasic         +---------------------+--------+--------+----------+--------+ Distal Stent         93              monophasic         +---------------------+--------+--------+----------+--------+ Distal to Stent      112             monophasic         +---------------------+--------+--------+----------+--------+   Summary: Abdominal Aorta: There is evidence of abnormal dilatation of the mid Abdominal aorta. Severe ectasia throught out the abdominal aorta and moderate calcific plaque noted. Severe monophasic waveform pattern in the bilateral iliac arteries and dampened flow  may suggest distal abdominal aortic stenosis. The largest aortic diameter remains essentially unchanged compared to prior exam. Previous diameter measurement was 3.0 cm obtained on 02/23/2023. Stenosis: +--------------------+-----------+-----------+ Location            Stent      Comments    +--------------------+-----------+-----------+ Right Common Iliac  no stenosis            +--------------------+-----------+-----------+ Left Common Iliac   no stenosis            +--------------------+-----------+-----------+  Right External Iliac           no stenosis +--------------------+-----------+-----------+ Left External Iliac no stenosis            +--------------------+-----------+-----------+  IVC/Iliac: Patent IVC.  *See table(s) above for measurements  and observations. See ABI report. Suggest follow up study in 12 months. Suggest Peripheral Vascular Consult.  Electronically signed by Erick Bergamo on 10/13/2023 at 6:17:01 PM.    Final     Recent Results (from the past 2160 hours)  VAS US  ABI WITH/WO TBI     Status: None   Collection Time: 10/13/23  9:19 AM  Result Value Ref Range   Right ABI .28    Left ABI .64   Basic Metabolic Panel (BMET)     Status: None   Collection Time: 11/02/23  2:09 PM  Result Value Ref Range   Glucose 78 70 - 99 mg/dL   BUN 13 8 - 27 mg/dL   Creatinine, Ser 9.21 0.76 - 1.27 mg/dL   eGFR 95 >40 fO/fpw/8.26   BUN/Creatinine Ratio 17 10 - 24   Sodium 140 134 - 144 mmol/L   Potassium 4.5 3.5 - 5.2 mmol/L   Chloride 101 96 - 106 mmol/L   CO2 22 20 - 29 mmol/L   Calcium  9.6 8.6 - 10.2 mg/dL  Magnesium      Status: None   Collection Time: 11/02/23  2:09 PM  Result Value Ref Range   Magnesium  2.3 1.6 - 2.3 mg/dL  Surgical pcr screen     Status: Abnormal   Collection Time: 11/03/23  1:55 PM   Specimen: Nasal Mucosa; Nasal Swab  Result Value Ref Range   MRSA, PCR (A) NEGATIVE    INVALID, UNABLE TO DETERMINE THE PRESENCE OF TARGET DUE TO SPECIMEN INTEGRITY. RECOLLECTION REQUESTED.    Comment: RESULT CALLED TO, READ BACK BY AND VERIFIED WITH: EMAILED L. HODGINS [id number] @1820  FH    Staphylococcus aureus (A) NEGATIVE    INVALID, UNABLE TO DETERMINE THE PRESENCE OF TARGET DUE TO SPECIMEN INTEGRITY. RECOLLECTION REQUESTED.    Comment: Performed at [facility], [address] [place], [place] [address]  Urinalysis, Routine w reflex microscopic -Urine, Clean Catch     Status: Abnormal   Collection Time: 11/03/23  2:13 PM  Result Value Ref Range   Color, Urine  YELLOW YELLOW   APPearance CLEAR CLEAR   Specific Gravity, Urine 1.011 1.005 - 1.030   pH 6.0 5.0 - 8.0   Glucose, UA NEGATIVE NEGATIVE mg/dL   Hgb urine dipstick LARGE (A) NEGATIVE   Bilirubin Urine NEGATIVE NEGATIVE   Ketones, ur NEGATIVE NEGATIVE mg/dL   Protein, ur NEGATIVE NEGATIVE mg/dL   Nitrite NEGATIVE NEGATIVE   Leukocytes,Ua NEGATIVE NEGATIVE   RBC / HPF >50 0 - 5 RBC/hpf   WBC, UA 0-5 0 - 5 WBC/hpf   Bacteria, UA RARE (A) NONE SEEN   Squamous Epithelial / HPF 0-5 0 - 5 /HPF   Mucus PRESENT     Comment: Performed at [facility], [address], [place], [place] [address]  Type and screen     Status: None   Collection Time: 11/03/23  2:15 PM  Result Value Ref Range   ABO/RH(D) O POS    Antibody Screen NEG    Sample Expiration 11/17/2023,2359    Extend sample reason      NO TRANSFUSIONS OR PREGNANCY IN THE PAST 3 MONTHS Performed at [facility], [address] [place], [place] [address]   CBC     Status: None   Collection Time: 11/03/23  2:30 PM  Result Value Ref Range   WBC 8.0 4.0 - 10.5 K/uL   RBC 4.62 4.22 - 5.81 MIL/uL   Hemoglobin 13.6  13.0 - 17.0 g/dL   HCT 57.7 60.9 - 47.9 %   MCV 91.3 80.0 - 100.0 fL   MCH 29.4 26.0 - 34.0 pg   MCHC 32.2 30.0 - 36.0 g/dL   RDW 85.7 88.4 - 84.4 %   Platelets 209 150 - 400 K/uL   nRBC 0.0 0.0 - 0.2 %    Comment: Performed at [facility], [address] [place], [place] [address]  Comprehensive metabolic panel     Status: Abnormal   Collection Time: 11/03/23  2:30 PM  Result Value Ref Range   Sodium 138 135 - 145 mmol/L   Potassium 4.6 3.5 - 5.1 mmol/L   Chloride 102 98 - 111 mmol/L   CO2 26 22 - 32 mmol/L   Glucose, Bld 102 (H) 70 - 99 mg/dL    Comment: Glucose reference range applies only to samples taken after fasting for at least 8 hours.   BUN 13 8 - 23 mg/dL   Creatinine, Ser 9.08 0.61 - 1.24 mg/dL   Calcium  9.8 8.9 - 10.3 mg/dL   Total Protein 7.6 6.5 - 8.1 g/dL   Albumin  3.8 3.5 - 5.0  g/dL   AST 21 15 - 41 U/L   ALT 14 0 - 44 U/L   Alkaline Phosphatase 76 38 - 126 U/L   Total Bilirubin 0.8 0.0 - 1.2 mg/dL   GFR, Estimated >39 >39 mL/min    Comment: (NOTE) Calculated using the CKD-EPI Creatinine Equation (2021)    Anion gap 10 5 - 15    Comment: Performed at [facility], [address] [place], [place] [address]  Protime-INR     Status: None   Collection Time: 11/03/23  2:30 PM  Result Value Ref Range   Prothrombin Time 15.0 11.4 - 15.2 seconds   INR 1.2 0.8 - 1.2    Comment: (NOTE) INR goal varies based on device and disease states. Performed at [facility], [address], [place], [place] [address]   APTT     Status: Abnormal   Collection Time: 11/03/23  2:30 PM  Result Value Ref Range   aPTT 39 (H) 24 - 36 seconds    Comment:        IF BASELINE aPTT IS ELEVATED, SUGGEST PATIENT RISK ASSESSMENT BE USED TO DETERMINE APPROPRIATE ANTICOAGULANT THERAPY. Performed at [facility], [address], [place], [place] [address]   Surgical pcr screen     Status: None   Collection Time: 11/09/23  7:00 AM   Specimen: Nasal Mucosa; Nasal Swab  Result Value Ref Range   MRSA, PCR NEGATIVE NEGATIVE   Staphylococcus aureus NEGATIVE NEGATIVE    Comment: (NOTE) The Xpert SA Assay (FDA approved for NASAL specimens in patients 72 years of age and older), is one component of a comprehensive surveillance program. It is not intended to diagnose infection nor to guide or monitor treatment. Performed at [facility], [address], [place], [place] [address]   POCT Activated clotting time     Status: None   Collection Time: 11/09/23 10:13 AM  Result Value Ref Range   Activated Clotting Time 256 seconds    Comment: Reference range 74-137 seconds for patients not on anticoagulant therapy.  POCT Activated clotting time     Status: None   Collection Time: 11/09/23 10:48 AM  Result Value Ref Range   Activated Clotting Time 210 seconds     Comment: Reference range 74-137  seconds for patients not on anticoagulant therapy.  CBC     Status: Abnormal   Collection Time: 11/09/23 12:47 PM  Result Value Ref Range   WBC 7.2 4.0 - 10.5 K/uL   RBC 3.65 (L) 4.22 - 5.81 MIL/uL   Hemoglobin 10.9 (L) 13.0 - 17.0 g/dL   HCT 66.5 (L) 60.9 - 47.9 %   MCV 91.5 80.0 - 100.0 fL   MCH 29.9 26.0 - 34.0 pg   MCHC 32.6 30.0 - 36.0 g/dL   RDW 86.0 88.4 - 84.4 %   Platelets 171 150 - 400 K/uL   nRBC 0.0 0.0 - 0.2 %    Comment: Performed at [facility], [address], [place], [place] [address]  Creatinine, serum     Status: None   Collection Time: 11/09/23  1:10 PM  Result Value Ref Range   Creatinine, Ser 0.83 0.61 - 1.24 mg/dL   GFR, Estimated >39 >39 mL/min    Comment: (NOTE) Calculated using the CKD-EPI Creatinine Equation (2021) Performed at [facility], [address], [place], [place] [address]   CBC     Status: Abnormal   Collection Time: 11/10/23  4:15 AM  Result Value Ref Range   WBC 10.8 (H) 4.0 - 10.5 K/uL   RBC 3.36 (L) 4.22 - 5.81 MIL/uL   Hemoglobin 9.9 (L) 13.0 - 17.0 g/dL   HCT 69.9 (L) 60.9 - 47.9 %   MCV 89.3 80.0 - 100.0 fL   MCH 29.5 26.0 - 34.0 pg   MCHC 33.0 30.0 - 36.0 g/dL   RDW 85.9 88.4 - 84.4 %   Platelets 160 150 - 400 K/uL   nRBC 0.0 0.0 - 0.2 %    Comment: Performed at [facility], [address] [place], [place] [address]  Basic metabolic panel     Status: Abnormal   Collection Time: 11/10/23  4:15 AM  Result Value Ref Range   Sodium 136 135 - 145 mmol/L   Potassium 4.5 3.5 - 5.1 mmol/L   Chloride 104 98 - 111 mmol/L   CO2 23 22 - 32 mmol/L   Glucose, Bld 161 (H) 70 - 99 mg/dL    Comment: Glucose reference range applies only to samples taken after fasting for at least 8 hours.   BUN 17 8 - 23 mg/dL   Creatinine, Ser 8.95 0.61 - 1.24 mg/dL   Calcium  8.4 (L) 8.9 - 10.3 mg/dL   GFR, Estimated >39 >39 mL/min    Comment: (NOTE) Calculated using the CKD-EPI Creatinine Equation  (2021)    Anion gap 9 5 - 15    Comment: Performed at [facility], [address], [place], [place] [address]  Lipid panel     Status: Abnormal   Collection Time: 11/10/23  4:15 AM  Result Value Ref Range   Cholesterol 105 0 - 200 mg/dL   Triglycerides 85 <849 mg/dL   HDL 36 (L) >59 mg/dL   Total CHOL/HDL Ratio 2.9 RATIO   VLDL 17 0 - 40 mg/dL   LDL Cholesterol 52 0 - 99 mg/dL    Comment:        Total Cholesterol/HDL:CHD Risk Coronary Heart Disease Risk Table                     Men   Women  1/2 Average Risk   3.4   3.3  Average Risk       5.0  4.4  2 X Average Risk   9.6   7.1  3 X Average Risk  23.4   11.0        Use the calculated Patient Ratio above and the CHD Risk Table to determine the patient's CHD Risk.        ATP III CLASSIFICATION (LDL):  <100     mg/dL   Optimal  899-870  mg/dL   Near or Above                    Optimal  130-159  mg/dL   Borderline  839-810  mg/dL   High  >809     mg/dL   Very High Performed at [facility], [address], [place], [place] [address]         Beverley Adine Hummer, MD, MS

## 2023-12-29 NOTE — Telephone Encounter (Signed)
 Pt c/o medication issue:  1. Name of Medication:   rivaroxaban  (XARELTO ) 20 MG TABS tablet   2. How are you currently taking this medication (dosage and times per day)?   3. Are you having a reaction (difficulty breathing--STAT)?   4. What is your medication issue?    Patient stated he has been having blood in urine, SOB and a cough (with foamy white phlem with blood specks) with a sharp pain in left lung.  Patient has been off blood thinner for 3 weeks.  Patient wants to get order for a CT scan.

## 2023-12-29 NOTE — ED Provider Notes (Signed)
 [place] [facility] AT [facility] Provider Note   CSN: [id number] Arrival date & time: 12/29/23  1649     History  Chief Complaint  Patient presents with   Atrial Fibrillation    Bradley Ferguson is a 73 y.o. male with PMH as listed below who presents with left-sided chest pain, atrial fibrillation.   Endorses left-sided chest pain. Always has a smoker's cough, but states it's been worse for approximately one week productive of some white mucous and intermittent streaks of hemoptysis. No fevers/chills. Has PVD and takes gabapentin . H/o latent pulmonary TB s/p INH x 3 months.  Recent admission from 11/09/23-11/11/23 for critical ischemia of RLE treated w/ R common femoral endarterectomy. Had hematuria and quit taking xarelto  several weeks ago when he had continued hematuria. Has been seen by urology for hematuria in the past and had cystoscopy with no findings. Urology did not tell him to stop xarelto ; rather he self-discontinued.    Past Medical History:  Diagnosis Date   Anxiety    Atrial fibrillation (HCC)    Blood transfusion without reported diagnosis    Cataract    Depression    Emphysema of lung (HCC)    Heart murmur    Hyperlipidemia    Hypertension    PVD (peripheral vascular disease) (HCC)    Raynaud's syndrome    Substance abuse (HCC)    Tobacco   Systemic sclerosis (HCC)    TB lung, latent 12/19/2019   s/p INH x 3 months   Transaminitis 06/18/2020       Home Medications Prior to Admission medications   Medication Sig Start Date End Date Taking? Authorizing Provider  amoxicillin -clavulanate (AUGMENTIN ) 875-125 MG tablet Take 1 tablet by mouth every 12 (twelve) hours. 12/30/23  Yes Franklyn Sid SAILOR, MD  albuterol  (VENTOLIN  HFA) 108 [phone] Base) MCG/ACT inhaler Use  2 inhalations  15 minutes apart  every 4 hours  to rescue Asthma Attack 05/20/21   Tonita Fallow, MD  aspirin  EC 81 MG tablet Take 81 mg by mouth daily. Swallow whole. Patient not  taking: Reported on 12/29/2023    [provider]  atorvastatin  (LIPITOR ) 80 MG tablet Take 1 tablet Daily for Cholesterol Patient taking differently: Take 80 mg by mouth every evening. Take 1 tablet Daily for Cholesterol 08/13/23   Douglass Caul B, FNP  Cholecalciferol  250 MCG (10000 UT) CAPS Take 10,000 Units by mouth every evening.    [provider]  dofetilide  (TIKOSYN ) 500 MCG capsule TAKE 1 CAPSULE BY MOUTH 2 TIMES DAILY. 08/17/23   Court Dorn PARAS, MD  gabapentin  (NEURONTIN ) 300 MG capsule Take 1 capsule 3 x /day for Neuropathy Pain 08/14/23   Webb, Padonda B, FNP  ketoconazole  (NIZORAL ) 2 % cream Apply 1 Application topically daily as needed for irritation.    [provider]  oxyCODONE  (OXY IR/ROXICODONE ) 5 MG immediate release tablet Take 1 tablet (5 mg total) by mouth every 6 (six) hours as needed for severe pain (pain score 7-10). 11/11/23   Baglia, Corrina, PA-C  pyridOXINE (VITAMIN B-6) 100 MG tablet Take 100 mg by mouth every evening.    [provider]  rivaroxaban  (XARELTO ) 20 MG TABS tablet Take 1 tablet (20 mg total) by mouth daily with supper. Patient not taking: Reported on 12/29/2023 12/01/23   Elna Loud P, PA-C  triamcinolone  cream (KENALOG ) 0.5 % Apply 1 Application topically daily as needed (skin irritation).    [provider]      Allergies  Cymbalta  [duloxetine  hcl], Effexor [venlafaxine], Wellbutrin  [bupropion ], and Zoloft [sertraline hcl]    Review of Systems   Review of Systems A 10 point review of systems was performed and is negative unless otherwise reported in HPI.  Physical Exam Updated Vital Signs BP 100/79   Pulse 85   Temp 97.8 F (36.6 C) (Oral)   Resp (!) 23   Ht 6' 1 (1.854 m)   Wt 70.3 kg   SpO2 100%   BMI 20.45 kg/m  Physical Exam General: Normal appearing male, lying in bed.  HEENT: Sclera anicteric, MMM, trachea midline.  Cardiology: Irregularly irregular rate/rhythm, no  murmurs/rubs/gallops. BL radial and DP pulses equal bilaterally.  Resp: Normal respiratory rate and effort. CTAB, no wheezes, rhonchi, crackles.  Abd: Soft, non-tender, non-distended. No rebound tenderness or guarding.  GU: Deferred. MSK: No peripheral edema or signs of trauma. Extremities without deformity or TTP. No cyanosis or clubbing. Skin: warm, dry. Neuro: A&Ox4, CNs II-XII grossly intact. MAEs. Sensation grossly intact.  Psych: Normal mood and affect.   ED Results / Procedures / Treatments   Labs (all labs ordered are listed, but only abnormal results are displayed) Labs Reviewed  CBC WITH DIFFERENTIAL/PLATELET - Abnormal; Notable for the following components:      Result Value   RBC 4.03 (*)    Hemoglobin 10.6 (*)    HCT 33.5 (*)    Neutro Abs 8.0 (*)    All other components within normal limits  COMPREHENSIVE METABOLIC PANEL WITH GFR - Abnormal; Notable for the following components:   Sodium 134 (*)    Glucose, Bld 125 (*)    Total Protein 6.3 (*)    Albumin  2.6 (*)    All other components within normal limits  D-DIMER, QUANTITATIVE - Abnormal; Notable for the following components:   D-Dimer, Quant 3.42 (*)    All other components within normal limits  URINALYSIS, ROUTINE W REFLEX MICROSCOPIC - Abnormal; Notable for the following components:   Specific Gravity, Urine >1.046 (*)    All other components within normal limits  MAGNESIUM     EKG Atrial flutter Multiple ventricular premature complexes RBBB and LPFB When compared with ECG of 11/09/2023, Atrial flutter has replaced Sinus rhythm    Radiology CXR: 1. Left upper lung zone masslike opacity spans approximately 5.4 cm. Recommend further evaluation with chest CT. This may represent infection or neoplasm. 2. Possible small left pleural effusion. 3. Chronic bronchial thickening and hyperinflation.   CT PE: 1. Negative for acute pulmonary embolism. 2. New geographic consolidation in the posterior left upper  lobe with central bronchiectasis measuring 4.6 x 6.0 cm. This is favored to represent pneumonia. Malignancy is not excluded and follow-up in 6-8 weeks after treatment is recommended. 3. Peripheral and lower lung predominant interstitial lung disease. Compared to 06/04/2021 there is increased associated ground-glass and consolidated opacities which likely represent superimposed infection. 4. New 1.4 x 0.7 cm nodule in the posterior left lower lobe likely infectious/inflammatory. Continued attention on follow-up. 5. Small left pleural effusion. 6. Prominent mediastinal and hilar nodes, continued attention on follow-up. 7. Aortic Atherosclerosis (ICD10-I70.0) and Emphysema (ICD10-J43.9).  Procedures Procedures    Medications Ordered in ED Medications  iohexol  (OMNIPAQUE ) 350 MG/ML injection 75 mL (75 mLs Intravenous Contrast Given 12/29/23 1934)  cefTRIAXone  (ROCEPHIN ) 1 g in sodium chloride  0.9 % 100 mL IVPB (0 g Intravenous Stopped 12/29/23 2101)  azithromycin  (ZITHROMAX ) 500 mg in sodium chloride  0.9 % 250 mL IVPB (0 mg Intravenous Stopped 12/29/23 2247)  dofetilide  (  TIKOSYN ) capsule 500 mcg (500 mcg Oral Given 12/29/23 2247)  rivaroxaban  (XARELTO ) tablet 20 mg (20 mg Oral Given 12/29/23 2247)    ED Course/ Medical Decision Making/ A&P                          Medical Decision Making Amount and/or Complexity of Data Reviewed Labs: ordered. Decision-making details documented in ED Course. Radiology: ordered. Decision-making details documented in ED Course.  Risk Prescription drug management.    This patient presents to the ED for concern of cough, chest pain, this involves an extensive number of treatment options, and is a complaint that carries with it a high risk of complications and morbidity.  I considered the following differential and admission for this acute, potentially life threatening condition. HDS, no resp disterss, no acute distress, no hypoxia. Overall non-toxic  appearing.  MDM:    Pt with SOB/cough and Afib. Rate controlled. Not currently taking xarelto . BP okay, no need for emergent cardioversion. He has mass-like opacity on CXR and CT PE shows geographic consolidation, likely PNA but will need f/u to assure resolution to r/o malignancy. I informed patient of these findings and he reports understanding. I discussed with the patient that he will need to restart his xarelto  and he reports understanding. Advised f/u with urology if hematuria restarts and given ED return precautions as well. Patient is given ceftriaxone  and azithromycin  for his pneumonia.  He requests his home Tikosyn  and I discussed with pharmacy who states that a single dose of azithromycin  though it has the QT prolonging properties will be okay.  Also was given his nightly dose of Xarelto .  Because patient is taking Tikosyn  we will discharge him on Augmentin .  Clinical Course as of 01/03/24 0027  Tue Dec 29, 2023  1853 DG Chest Portable 1 View 1. Left upper lung zone masslike opacity spans approximately 5.4 cm. Recommend further evaluation with chest CT. This may represent infection or neoplasm. 2. Possible small left pleural effusion. 3. Chronic bronchial thickening and hyperinflation.   [HN]  2014 CT Angio Chest PE W and/or Wo Contrast 1. Negative for acute pulmonary embolism. 2. New geographic consolidation in the posterior left upper lobe with central bronchiectasis measuring 4.6 x 6.0 cm. This is favored to represent pneumonia. Malignancy is not excluded and follow-up in 6-8 weeks after treatment is recommended. 3. Peripheral and lower lung predominant interstitial lung disease. Compared to 06/04/2021 there is increased associated ground-glass and consolidated opacities which likely represent superimposed infection. 4. New 1.4 x 0.7 cm nodule in the posterior left lower lobe likely infectious/inflammatory. Continued attention on follow-up. 5. Small left pleural  effusion. 6. Prominent mediastinal and hilar nodes, continued attention on follow-up. 7. Aortic Atherosclerosis (ICD10-I70.0) and Emphysema (ICD10-J43.9).  [HN]  2114 Urinalysis, Routine w reflex microscopic -Urine, Clean Catch(!) +dehydration, no UTI or hematuria [HN]  2154 Overall well-appearing. Receiving abx for PNA. No resp distress, no hypoxia. Pt is taking PO. No SOB. Informed of imaging findings and need for f/u imaging, patient reports understanding. Also discussed restarting xarelto  bc he is in Afib. Patient reports understanding though voices concerns about hematuria. I encouraged follow up with PCP, cardiologist, and urologist.  [HN]    Clinical Course User Index [HN] Franklyn Sid SAILOR, MD    Labs: I Ordered, and personally interpreted labs.  The pertinent results include:  those listed above  Imaging Studies ordered: I ordered imaging studies including CXR, CT PE I independently visualized and  interpreted imaging. I agree with the radiologist interpretation  Additional history obtained from chart review.   Cardiac Monitoring: The patient was maintained on a cardiac monitor.  I personally viewed and interpreted the cardiac monitored which showed an underlying rhythm of: Afib/flutter  Reevaluation: After the interventions noted above, I reevaluated the patient and found that they have :improved  Social Determinants of Health: Lives independently  Disposition:  DC w/ discharge instructions/return precautions. All questions answered to patient's satisfaction.    Co morbidities that complicate the patient evaluation  Past Medical History:  Diagnosis Date   Anxiety    Atrial fibrillation (HCC)    Blood transfusion without reported diagnosis    Cataract    Depression    Emphysema of lung (HCC)    Heart murmur    Hyperlipidemia    Hypertension    PVD (peripheral vascular disease) (HCC)    Raynaud's syndrome    Substance abuse (HCC)    Tobacco   Systemic sclerosis  (HCC)    TB lung, latent 12/19/2019   s/p INH x 3 months   Transaminitis 06/18/2020     Medicines Meds ordered this encounter  Medications   iohexol  (OMNIPAQUE ) 350 MG/ML injection 75 mL   cefTRIAXone  (ROCEPHIN ) 1 g in sodium chloride  0.9 % 100 mL IVPB    Antibiotic Indication::   CAP   azithromycin  (ZITHROMAX ) 500 mg in sodium chloride  0.9 % 250 mL IVPB   dofetilide  (TIKOSYN ) capsule 500 mcg   rivaroxaban  (XARELTO ) tablet 20 mg   amoxicillin -clavulanate (AUGMENTIN ) 875-125 MG tablet    Sig: Take 1 tablet by mouth every 12 (twelve) hours.    Dispense:  14 tablet    Refill:  0    I have reviewed the patients home medicines and have made adjustments as needed  Problem List / ED Course: Problem List Items Addressed This Visit       Cardiovascular and Mediastinum   Atrial fibrillation (HCC) - Primary   Other Visit Diagnoses       Pneumonia of left upper lobe due to infectious organism       Relevant Medications   cefTRIAXone  (ROCEPHIN ) 1 g in sodium chloride  0.9 % 100 mL IVPB (Completed)   azithromycin  (ZITHROMAX ) 500 mg in sodium chloride  0.9 % 250 mL IVPB (Completed)   amoxicillin -clavulanate (AUGMENTIN ) 875-125 MG tablet                   This note was created using dictation software, which may contain spelling or grammatical errors.    Franklyn Sid SAILOR, MD 01/03/24 [phone]

## 2023-12-29 NOTE — Telephone Encounter (Signed)
 Noted patient has an appointment today @ 3:00 pm with provider. Dm/cma

## 2023-12-29 NOTE — Telephone Encounter (Signed)
 FYI Only or Action Required?: FYI only for provider.  Patient was last seen in primary care on 10/14/2023 by Sebastian Beverley NOVAK, MD. Called Nurse Triage reporting Cough. Symptoms began several weeks ago. Interventions attempted: Nothing. Symptoms are: gradually worsening.  Triage Disposition: See HCP Within 4 Hours (Or PCP Triage)  Patient/caregiver understands and will follow disposition?: Yes   Payton, RN from [facility] States pt called them and c/o SOB with cough with white phlegm with specks of blood and has been off the Xerolalto for 3 weeks. States pt described SOB as consistent but has been gotten worse over the last couple weeks and they had no appts to get him in today and would like him to be seen today.      Copied from CRM [id number]. Topic: Clinical - Red Word Triage >> Dec 29, 2023 11:08 AM Berneda FALCON wrote: Red Word that prompted transfer to Nurse Triage: Payton with [facility] care on the line for this patient wanting to get him an appt for today.  Pt complains of shortness of breath worsening the past couple of days and worse with cough.  Productive cough produces with specks of blood Reason for Disposition  [1] MILD difficulty breathing (e.g., minimal/no SOB at rest, SOB with walking, pulse <100) AND [2] still present when not coughing  [1] MILD difficulty breathing (e.g., minimal/no SOB at rest, SOB with walking, pulse <100) AND [2] NEW-onset or WORSE than normal  Answer Assessment - Initial Assessment Questions 1. RESPIRATORY STATUS: Describe your breathing? (e.g., wheezing, shortness of breath, unable to speak, severe coughing)      Sob  2. ONSET: When did this breathing problem begin?      Last 2-3 weeks 3. PATTERN Does the difficult breathing come and go, or has it been constant since it started?      Comes and goes 4. SEVERITY: How bad is your breathing? (e.g., mild, moderate, severe)    - MILD: No SOB at rest, mild SOB with walking, speaks normally  in sentences, can lie down, no retractions, pulse < 100.    - MODERATE: SOB at rest, SOB with minimal exertion and prefers to sit, cannot lie down flat, speaks in phrases, mild retractions, audible wheezing, pulse 100-120.    - SEVERE: Very SOB at rest, speaks in single words, struggling to breathe, sitting hunched forward, retractions, pulse > 120      Mild-mod 5. RECURRENT SYMPTOM: Have you had difficulty breathing before? If Yes, ask: When was the last time? and What happened that time?      yes 6. CARDIAC HISTORY: Do you have any history of heart disease? (e.g., heart attack, angina, bypass surgery, angioplasty)      afib 7. LUNG HISTORY: Do you have any history of lung disease?  (e.g., pulmonary embolus, asthma, emphysema)     copd 8. CAUSE: What do you think is causing the breathing problem?      unknown 9. OTHER SYMPTOMS: Do you have any other symptoms? (e.g., dizziness, runny nose, cough, chest pain, fever)     Chest pain with breathing  Protocols used: Cough - Acute Productive-A-AH, Breathing Difficulty-A-AH

## 2023-12-29 NOTE — Discharge Instructions (Addendum)
 Thank you for coming to [facility] Emergency Department. You were seen for cough and atrial fibrillation. We did an exam, labs, and imaging, and these showed an abnormality on the CT of your chest that is most likely a pneumonia.  Please take Augmentin twice a day for 7 days.  It is critical that you follow-up with your primary care physician for repeat imaging in 6 to 8 weeks to ensure that this is resolved. Please follow up with your primary care provider within 1 week.   Do not hesitate to return to the ED or call 911 if you experience: -Worsening symptoms -Shortness of breath - -Lightheadedness, passing out -Fevers/chills -Anything else that concerns you

## 2023-12-29 NOTE — Telephone Encounter (Signed)
 Spoke with pt and asked about the symptoms he is having. Pt stated that he had stopped his Xarelto  3 weeks ago due to blood in his urine. Once the Xarelto  was stopped, the blood in the urine stopped as well. Pt has been experiencing shortness of breath and a productive cough with white phlem that has blood specks in it. The shortness of breath has been getting increasingly worse and he has a pain in his left lung. Pt is concerned that it could be a PE. Discussed with DOD (Dr. Ladona) and he stated that there is a possibility of it being a PE but it could also be an infection. Dr. Ladona asked for us  to call pt's PCP to see if we could get him in today at their office d/t no available appts on either DOD schedule for today. Spoke with Andrika (nurse) at [facility] who stated they could get the pt in today and would call the pt to let him know and ask a couple of questions.

## 2024-01-05 ENCOUNTER — Encounter: Payer: PPO | Admitting: Internal Medicine

## 2024-01-05 ENCOUNTER — Ambulatory Visit: Admitting: General Practice

## 2024-01-12 ENCOUNTER — Encounter: Payer: Self-pay | Admitting: Family Medicine

## 2024-01-12 ENCOUNTER — Ambulatory Visit (INDEPENDENT_AMBULATORY_CARE_PROVIDER_SITE_OTHER): Admitting: Family Medicine

## 2024-01-12 VITALS — BP 118/78 | HR 116 | Temp 98.2°F | Ht 73.0 in | Wt 148.0 lb

## 2024-01-12 DIAGNOSIS — J181 Lobar pneumonia, unspecified organism: Secondary | ICD-10-CM | POA: Insufficient documentation

## 2024-01-12 DIAGNOSIS — J432 Centrilobular emphysema: Secondary | ICD-10-CM

## 2024-01-12 DIAGNOSIS — F17200 Nicotine dependence, unspecified, uncomplicated: Secondary | ICD-10-CM

## 2024-01-12 DIAGNOSIS — I480 Paroxysmal atrial fibrillation: Secondary | ICD-10-CM

## 2024-01-12 MED ORDER — ALBUTEROL SULFATE (2.5 MG/3ML) 0.083% IN NEBU: 2.5000 mg | INHALATION_SOLUTION | Freq: Four times a day (QID) | RESPIRATORY_TRACT | 1 refills | Status: DC | PRN

## 2024-01-12 MED ORDER — PREDNISONE 50 MG PO TABS: ORAL_TABLET | ORAL | 0 refills | Status: DC

## 2024-01-12 NOTE — Progress Notes (Signed)
 Assessment & Plan   Assessment/Plan:    Assessment & Plan Multifocal Pneumonia Presents with multifocal pneumonia, initially diagnosed on 12/29/2023, with geographic consolidation in the posterior left upper lobe and a new nodule in the posterior left lower lobe. Symptoms include significant coughing with phlegm and hemoptysis, weakness, and dyspnea. Completed antibiotics including Cefixime, azithromycin , and Augmentin . Persistent symptoms suggest ongoing inflammation and infection. Given history of heavy smoking, malignancy cannot be ruled out. Repeat CT scan recommended to assess lung consolidations and rule out malignancy. - Order repeat CT scan in one month to assess lung consolidations and rule out malignancy. - Refer to pulmonology for further assessment and management of lung disease. - Administer nebulizer treatment in office. - Prescribe prednisone  50 mg daily for 5 days. - Continue albuterol  inhaler as needed for dyspnea.  Chronic Obstructive Pulmonary Disease (COPD) Heavy smoking and emphysema contribute to COPD. Symptoms include dyspnea and cough, exacerbated by pneumonia. Albuterol  inhaler provides some relief. Consideration for steroids and nebulizer treatment to reduce inflammation and improve airflow. - Refer to pulmonology for further management of COPD. - Encourage smoking cessation to reduce risk of exacerbations and complications. - Administer nebulizer treatment in office. - Prescribe prednisone  50 mg daily for 5 days.  Atrial Fibrillation with Rapid Ventricular Response (RVR) Atrial fibrillation with RVR, currently in atrial flutter/fibrillation. Symptoms likely exacerbated by pneumonia. Currently anticoagulated with rivaroxaban  and on dofetilide . Heart rate is borderline controlled at 100 bpm. History of cardioversion and RF ablation. Cardioversion not recommended currently due to stress of respiratory illness. Focus on rate control and anticoagulation to prevent  thromboembolic events. - Continue rivaroxaban  20 mg daily. - Continue dofetilide  500 mcg BID. - Follow up with cardiology, specifically Dr. Dorn Lesches and Dr. Soyla Mu, for ongoing management.  General Health Maintenance Heavy smoker, increasing risk of cardiovascular disease, lung cancer, and exacerbation of COPD and pneumonia. Smoking cessation is critical to improve overall health and reduce risks. - Advise smoking cessation to improve overall health and reduce risks associated with smoking.      Medications Discontinued During This Encounter  Medication Reason   amoxicillin -clavulanate (AUGMENTIN ) 875-125 MG tablet     Return if symptoms worsen or fail to improve.        Subjective:   Encounter date: 01/12/2024  Bradley Ferguson is a 73 y.o. male who has ANXIETY DEPRESSION; TOBACCO ABUSE; RAYNAUDS SYNDROME; Attention deficit disorder; Dental caries; Bipolar I disorder (HCC); Atrial fibrillation (HCC); Mixed hyperlipidemia; Vitamin D  deficiency; Labile hypertension; BPH; Peripheral arterial disease (HCC); Visit for monitoring Tikosyn  therapy; Colon polyps; Acquired thrombophilia (HCC); Atherosclerosis of aorta (HCC) by Chest CT scan on 01/19/2020; Chronic obstructive pulmonary disease (HCC); Centrilobular emphysema (HCC); Interstitial pulmonary disease (HCC); Positive ANA (antinuclear antibody); TB lung, latent; Systemic scleroses (HCC); Right inguinal hernia; Essential hypertension; Corn of foot; Elevated LFTs; Hepatic steatosis; Elevated coronary artery calcium  score; Critical limb ischemia of left lower extremity (HCC); Aortoiliac occlusive disease (HCC); Critical limb ischemia of right lower extremity (HCC); Atherosclerosis of lower extremity with claudication (HCC); and Multifocal lung consolidation (HCC) on their problem list..   He  has a past medical history of Anxiety, Atrial fibrillation (HCC), Blood transfusion without reported diagnosis, Cataract, Depression,  Emphysema of lung (HCC), Heart murmur, Hyperlipidemia, Hypertension, PVD (peripheral vascular disease) (HCC), Raynaud's syndrome, Substance abuse (HCC), Systemic sclerosis (HCC), TB lung, latent (12/19/2019), and Transaminitis (06/18/2020).SABRA   He presents with chief complaint of Follow-up (Still short of breathe and feeling weak, feel like worse since last visit;  believe still in Afib; would like EKG and want to discuss CT scan had done in ED) .   Discussed the use of AI scribe software for clinical note transcription with the patient, who gave verbal consent to proceed.  History of Present Illness Bradley Ferguson is a 73 year old male with multifocal pneumonia and atrial fibrillation who presents for a two-week follow-up on chest pain.  He initially presented to the emergency department on December 29, 2023, with chest pain and was found to be in atrial fibrillation with rapid ventricular response. A CT scan revealed significant geographic consolidation in the posterior left upper lobe with central bronchiectasis. A follow-up CT scan was recommended in six to eight weeks. The scan also showed peripheral and lower lung predominant interstitial lung disease with increased ground glass and consolidated opacities, and a new nodule in the posterior left lower lobe.  He is currently experiencing weakness and shortness of breath. He has been coughing up significant amounts of phlegm and blood, describing it as 'by the gallon'. He uses an albuterol  inhaler more frequently, which helps when anticipating physical activity. He struggles with daily activities, such as showering, due to shortness of breath and fatigue.  He completed a course of antibiotics including Cefixime and azithromycin  in the emergency department and Augmentin  in the outpatient setting. He is currently on rivaroxaban  20 mg daily for anticoagulation and dofetilide  500 mcg twice daily. He has a history of atrial fibrillation for 25 years, which he  believes may have been triggered by an electrocution incident at work. He has undergone RF ablation and has been on Tikosyn , which had been effective for several years.  He reports significant weight loss, from 184 pounds a year ago to 148 pounds currently, and is concerned about his appetite. He is a heavy smoker and has a history of emphysema and COPD. He is concerned about his ability to maintain adequate nutrition and hydration.     ROS  Past Surgical History:  Procedure Laterality Date   ABDOMINAL AORTOGRAM W/LOWER EXTREMITY Left 07/09/2023   Procedure: ABDOMINAL AORTOGRAM W/LOWER EXTREMITY;  Surgeon: Court Dorn PARAS, MD;  Location: [facility];  Service: Cardiovascular;  Laterality: Left;   ATRIAL FIBRILLATION ABLATION     CATARACT EXTRACTION Right    COLONOSCOPY     ENDARTERECTOMY FEMORAL Left 09/02/2023   Procedure: LEFT LOWER EXTREMITY ANGIOGRAM WITH LEFT ILIAC  STENTING;  Surgeon: Gretta Lonni PARAS, MD;  Location: [facility] OR;  Service: Vascular;  Laterality: Left;   ENDARTERECTOMY FEMORAL Right 11/09/2023   Procedure: ENDARTERECTOMY, FEMORAL TO PROFUNDA;  Surgeon: Gretta Lonni PARAS, MD;  Location: [facility] OR;  Service: Vascular;  Laterality: Right;   EYE SURGERY     FEMORAL-POPLITEAL BYPASS GRAFT Left 09/02/2023   Procedure: LEFT FEMORAL ENDARTARECTOMY WITH PATCH ANGIOPLASTY;  Surgeon: Gretta Lonni PARAS, MD;  Location: [facility] OR;  Service: Vascular;  Laterality: Left;   FRACTURE SURGERY Left 1976   HERNIA REPAIR  2023   INGUINAL HERNIA REPAIR Right 03/23/2020   Procedure: OPEN RIGHT INGUINAL HERNIA REPAIR WITH MESH;  Surgeon: Gladis Cough, MD;  Location: [facility] OR;  Service: General;  Laterality: Right;   INSERTION OF MESH Right 03/23/2020   Procedure: INSERTION OF MESH;  Surgeon: Gladis Cough, MD;  Location: [facility] OR;  Service: General;  Laterality: Right;   LOWER EXTREMITY INTERVENTION N/A 04/27/2017   Procedure: LOWER EXTREMITY INTERVENTION;  Surgeon: Court Dorn PARAS,  MD;  Location: [facility];  Service: Cardiovascular;  Laterality:  N/A;   PATCH ANGIOPLASTY Right 11/09/2023   Procedure: ANGIOPLASTY, USING PATCH GRAFT, USING XENOSURE 1X14CM;  Surgeon: Gretta Lonni PARAS, MD;  Location: [facility] OR;  Service: Vascular;  Laterality: Right;   PERIPHERAL VASCULAR ATHERECTOMY Right 04/27/2017   Procedure: PERIPHERAL VASCULAR ATHERECTOMY;  Surgeon: Court Dorn PARAS, MD;  Location: [facility] INVASIVE CV LAB;  Service: Cardiovascular;  Laterality: Right;  Iliac   PERIPHERAL VASCULAR INTERVENTION Right 04/27/2017   Procedure: PERIPHERAL VASCULAR INTERVENTION;  Surgeon: Court Dorn PARAS, MD;  Location: [facility];  Service: Cardiovascular;  Laterality: Right;  Iliac    Outpatient Medications Prior to Visit  Medication Sig Dispense Refill   albuterol  (VENTOLIN  HFA) 108 (90 Base) MCG/ACT inhaler Use  2 inhalations  15 minutes apart  every 4 hours  to rescue Asthma Attack 48 g 3   atorvastatin  (LIPITOR ) 80 MG tablet Take 1 tablet Daily for Cholesterol (Patient taking differently: Take 80 mg by mouth daily. Take 1 tablet Daily for Cholesterol) 90 tablet 0   Cholecalciferol  250 MCG (10000 UT) CAPS Take 10,000 Units by mouth every evening.     dofetilide  (TIKOSYN ) 500 MCG capsule TAKE 1 CAPSULE BY MOUTH 2 TIMES DAILY. 180 capsule 3   gabapentin  (NEURONTIN ) 300 MG capsule Take 1 capsule 3 x /day for Neuropathy Pain 270 capsule 3   ketoconazole  (NIZORAL ) 2 % cream Apply 1 Application topically daily as needed for irritation.     oxyCODONE  (OXY IR/ROXICODONE ) 5 MG immediate release tablet Take 1 tablet (5 mg total) by mouth every 6 (six) hours as needed for severe pain (pain score 7-10). 30 tablet 0   pyridOXINE (VITAMIN B-6) 100 MG tablet Take 100 mg by mouth every evening.     rivaroxaban  (XARELTO ) 20 MG TABS tablet Take 1 tablet (20 mg total) by mouth daily with supper. 30 tablet 6   triamcinolone  cream (KENALOG ) 0.5 % Apply 1 Application topically daily as needed (skin  irritation).     amoxicillin -clavulanate (AUGMENTIN ) 875-125 MG tablet Take 1 tablet by mouth every 12 (twelve) hours. 14 tablet 0   aspirin  EC 81 MG tablet Take 81 mg by mouth daily. Swallow whole. (Patient not taking: Reported on 01/12/2024)     No facility-administered medications prior to visit.    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  He is down to 2 cigarettes a day.  Vaping Use   Vaping status: Never Used  Substance and Sexual Activity   Alcohol use: Yes    Alcohol/week: 2.0 standard drinks of alcohol    Types: 2 Standard drinks or equivalent per week    Comment: Social   Drug use: Yes    Frequency: 1.0 times per week    Types: Oxycodone    Sexual activity: Not Currently    Birth control/protection: None  Other Topics Concern   Not on file  Social History Narrative   Not on file   Social Drivers of Health   Financial Resource Strain: Medium Risk (12/29/2023)   Overall Financial Resource Strain (CARDIA)    Difficulty of Paying Living Expenses: Somewhat hard  Food Insecurity: Food Insecurity Present (12/29/2023)   Hunger Vital Sign    Worried About Running Out  of Food in the Last Year: Sometimes true    Ran Out of Food in the Last Year: Sometimes true  Transportation Needs: No Transportation Needs (12/29/2023)   PRAPARE - Administrator, Civil Service (Medical): No    Lack of Transportation (Non-Medical): No  Physical Activity: Inactive (12/29/2023)   Exercise Vital Sign    Days of Exercise per Week: 0 days    Minutes of Exercise per Session: Not on file  Stress: Stress Concern Present (12/29/2023)   [employer] of Occupational Health - Occupational Stress Questionnaire    Feeling of Stress: Rather much  Social Connections: Socially Isolated (12/29/2023)   Social Connection and Isolation Panel    Frequency of Communication with Friends and Family: More than three times a week    Frequency of Social Gatherings with Friends and Family: Once a week    Attends Religious Services: Never    Database administrator or Organizations: No    Attends Engineer, structural: Not on file    Marital Status: Widowed  Intimate Partner Violence: Not At Risk (11/12/2023)   Humiliation, Afraid, Rape, and Kick questionnaire    Fear of Current or Ex-Partner: No    Emotionally Abused: No    Physically Abused: No    Sexually Abused: No                                                                                                  Objective:  Physical Exam: BP 118/78   Pulse (!) 116   Temp 98.2 F (36.8 C)   Ht 6' 1 (1.854 m)   Wt 148 lb (67.1 kg)   SpO2 98%   BMI 19.53 kg/m    Wt Readings from Last 3 Encounters:  01/12/24 148 lb (67.1 kg)  12/29/23 154 lb 15.7 oz (70.3 kg)  12/29/23 155 lb (70.3 kg)   Results  DIAGNOSTIC EKG: Atrial flutter/fibrillation, RBBB, rate 100 (01/12/2024)    Physical Exam VITALS: P- 100 GENERAL: Alert, cooperative, well developed, no acute distress HEENT: Normocephalic, normal oropharynx, moist mucous membranes CHEST: Decreased air movement in lower lung fields, bronchovesicular changes noted,  clear to auscultation bilaterally, no wheezes, rhonchi, or crackles CARDIOVASCULAR: Normal heart rate and irregular rhythm, S1 and S2 normal without murmurs ABDOMEN: Soft, non-tender, non-distended, without organomegaly,  normal bowel sounds EXTREMITIES: No cyanosis or edema NEUROLOGICAL: Cranial nerves grossly intact, moves all extremities without gross motor or sensory deficit   Physical Exam  CT Angio Chest PE W and/or Wo Contrast Result Date: 12/29/2023 CLINICAL DATA:  PE suspected. Atrial fibrillation. Weakness. Suspicious mass seen on same day chest radiograph. EXAM: CT ANGIOGRAPHY CHEST WITH CONTRAST TECHNIQUE: Multidetector CT imaging of the chest was performed using the standard protocol during bolus administration of intravenous contrast. Multiplanar CT image reconstructions and MIPs were obtained to evaluate the vascular anatomy. RADIATION DOSE REDUCTION: This exam was performed according to the departmental dose-optimization program which includes automated exposure control, adjustment of the mA and/or kV according to patient size and/or use of iterative reconstruction technique. CONTRAST:  75mL OMNIPAQUE  IOHEXOL  350 MG/ML SOLN COMPARISON:  Same day chest radiograph; coronary artery CT 07/07/2023 and CT chest 06/04/2021 FINDINGS: Cardiovascular: Negative for acute pulmonary embolism. No pericardial effusion. Mitral annular and aortic valve calcification. Coronary artery and aortic atherosclerotic calcification. Mediastinum/Nodes: Prominent subcentimeter mediastinal nodes and left supraclavicular node (series 7/26). 1.0 cm right hilar node on series 7/image 147. Left hilar soft tissue thickening suspicious for lymphadenopathy measuring 1.5 by 2.0 cm (7/125). Layering debris in the posterior trachea. Esophagus is unremarkable. Lungs/Pleura: Emphysema. Peripheral and lower lung predominant interstitial lung disease. Compared to 06/04/2021 there is increased associated ground-glass and consolidated  opacities which may represent superimposed edema or infection. There is new geographic consolidation in the posterior left upper lobe with central bronchiectasis measuring 4.6 x 6.0 cm (series 6/image 47). New 1.4 x 0.7 cm nodule in the posterior left lower lobe (6/78). Diffuse bronchial wall thickening. Small left pleural effusion. No pneumothorax. Upper Abdomen: No acute abnormality. Musculoskeletal: No acute fracture or destructive osseous lesion Review of the MIP images confirms the above findings. IMPRESSION: 1. Negative for acute pulmonary embolism. 2. New geographic consolidation in the posterior left upper lobe with central bronchiectasis measuring 4.6 x 6.0 cm. This is favored to represent pneumonia. Malignancy is not excluded and follow-up in 6-8 weeks after treatment is recommended. 3. Peripheral and lower lung predominant interstitial lung disease. Compared to 06/04/2021 there is increased associated ground-glass and consolidated opacities which likely represent superimposed infection. 4. New 1.4 x 0.7 cm nodule in the posterior left lower lobe likely infectious/inflammatory. Continued attention on follow-up. 5. Small left pleural effusion. 6. Prominent mediastinal and hilar nodes, continued attention on follow-up. 7. Aortic Atherosclerosis (ICD10-I70.0) and Emphysema (ICD10-J43.9). Electronically Signed   By: Norman Gatlin M.D.   On: 12/29/2023 19:56   DG Chest Portable 1 View Result Date: 12/29/2023 CLINICAL DATA:  Weakness. EXAM: PORTABLE CHEST 1 VIEW COMPARISON:  Included portions from cardiac CT 07/07/2023, radiograph 12/19/2019 FINDINGS: Left upper lung zone masslike opacity spans approximately 5.4 cm. The lungs are hyperinflated with chronic bronchial thickening. May be a small left pleural effusion. Stable heart size and mediastinal contours. Aortic atherosclerosis. No pneumothorax. IMPRESSION: 1. Left upper lung zone masslike opacity spans approximately 5.4 cm. Recommend further evaluation  with chest CT. This may represent infection or neoplasm. 2. Possible small left pleural effusion. 3. Chronic bronchial thickening and hyperinflation. Electronically Signed   By: Andrea Gasman M.D.   On: 12/29/2023 18:06    Recent Results (from the past 2160 hours)  Basic Metabolic Panel (BMET)     Status: None   Collection Time: 11/02/23  2:09 PM  Result Value Ref Range   Glucose 78 70 - 99 mg/dL   BUN 13 8 - 27 mg/dL  Creatinine, Ser 0.78 0.76 - 1.27 mg/dL   eGFR 95 >40 fO/fpw/8.26   BUN/Creatinine Ratio 17 10 - 24   Sodium 140 134 - 144 mmol/L   Potassium 4.5 3.5 - 5.2 mmol/L   Chloride 101 96 - 106 mmol/L   CO2 22 20 - 29 mmol/L   Calcium  9.6 8.6 - 10.2 mg/dL  Magnesium      Status: None   Collection Time: 11/02/23  2:09 PM  Result Value Ref Range   Magnesium  2.3 1.6 - 2.3 mg/dL  Surgical pcr screen     Status: Abnormal   Collection Time: 11/03/23  1:55 PM   Specimen: Nasal Mucosa; Nasal Swab  Result Value Ref Range   MRSA, PCR (A) NEGATIVE    INVALID, UNABLE TO DETERMINE THE PRESENCE OF TARGET DUE TO SPECIMEN INTEGRITY. RECOLLECTION REQUESTED.    Comment: RESULT CALLED TO, READ BACK BY AND VERIFIED WITH: EMAILED L. HODGINS [id number] @1820  FH    Staphylococcus aureus (A) NEGATIVE    INVALID, UNABLE TO DETERMINE THE PRESENCE OF TARGET DUE TO SPECIMEN INTEGRITY. RECOLLECTION REQUESTED.    Comment: Performed at [facility], [address] [place], [place] [address]  Urinalysis, Routine w reflex microscopic -Urine, Clean Catch     Status: Abnormal   Collection Time: 11/03/23  2:13 PM  Result Value Ref Range   Color, Urine YELLOW YELLOW   APPearance CLEAR CLEAR   Specific Gravity, Urine 1.011 1.005 - 1.030   pH 6.0 5.0 - 8.0   Glucose, UA NEGATIVE NEGATIVE mg/dL   Hgb urine dipstick LARGE (A) NEGATIVE   Bilirubin Urine NEGATIVE NEGATIVE   Ketones, ur NEGATIVE NEGATIVE mg/dL   Protein, ur NEGATIVE NEGATIVE mg/dL   Nitrite NEGATIVE NEGATIVE   Leukocytes,Ua NEGATIVE  NEGATIVE   RBC / HPF >50 0 - 5 RBC/hpf   WBC, UA 0-5 0 - 5 WBC/hpf   Bacteria, UA RARE (A) NONE SEEN   Squamous Epithelial / HPF 0-5 0 - 5 /HPF   Mucus PRESENT     Comment: Performed at [facility], [address] [place], [place] [address]  Type and screen     Status: None   Collection Time: 11/03/23  2:15 PM  Result Value Ref Range   ABO/RH(D) O POS    Antibody Screen NEG    Sample Expiration 11/17/2023,2359    Extend sample reason      NO TRANSFUSIONS OR PREGNANCY IN THE PAST 3 MONTHS Performed at [facility], [address] [place], [place] [address]   CBC     Status: None   Collection Time: 11/03/23  2:30 PM  Result Value Ref Range   WBC 8.0 4.0 - 10.5 K/uL   RBC 4.62 4.22 - 5.81 MIL/uL   Hemoglobin 13.6 13.0 - 17.0 g/dL   HCT 57.7 60.9 - 47.9 %   MCV 91.3 80.0 - 100.0 fL   MCH 29.4 26.0 - 34.0 pg   MCHC 32.2 30.0 - 36.0 g/dL   RDW 85.7 88.4 - 84.4 %   Platelets 209 150 - 400 K/uL   nRBC 0.0 0.0 - 0.2 %    Comment: Performed at [facility], [address], [place], [place] [address]  Comprehensive metabolic panel     Status: Abnormal   Collection Time: 11/03/23  2:30 PM  Result Value Ref Range   Sodium 138 135 - 145 mmol/L   Potassium 4.6 3.5 - 5.1 mmol/L   Chloride 102 98 -  111 mmol/L   CO2 26 22 - 32 mmol/L   Glucose, Bld 102 (H) 70 - 99 mg/dL    Comment: Glucose reference range applies only to samples taken after fasting for at least 8 hours.   BUN 13 8 - 23 mg/dL   Creatinine, Ser 9.08 0.61 - 1.24 mg/dL   Calcium  9.8 8.9 - 10.3 mg/dL   Total Protein 7.6 6.5 - 8.1 g/dL   Albumin  3.8 3.5 - 5.0 g/dL   AST 21 15 - 41 U/L   ALT 14 0 - 44 U/L   Alkaline Phosphatase 76 38 - 126 U/L   Total Bilirubin 0.8 0.0 - 1.2 mg/dL   GFR, Estimated >39 >39 mL/min    Comment: (NOTE) Calculated using the CKD-EPI Creatinine Equation (2021)    Anion gap 10 5 - 15    Comment: Performed at [facility], [address] [place], [place] [address]   Protime-INR     Status: None   Collection Time: 11/03/23  2:30 PM  Result Value Ref Range   Prothrombin Time 15.0 11.4 - 15.2 seconds   INR 1.2 0.8 - 1.2    Comment: (NOTE) INR goal varies based on device and disease states. Performed at [facility], [address], [place], [place] [address]   APTT     Status: Abnormal   Collection Time: 11/03/23  2:30 PM  Result Value Ref Range   aPTT 39 (H) 24 - 36 seconds    Comment:        IF BASELINE aPTT IS ELEVATED, SUGGEST PATIENT RISK ASSESSMENT BE USED TO DETERMINE APPROPRIATE ANTICOAGULANT THERAPY. Performed at [facility], [address] [place], [place] [address]   Surgical pcr screen     Status: None   Collection Time: 11/09/23  7:00 AM   Specimen: Nasal Mucosa; Nasal Swab  Result Value Ref Range   MRSA, PCR NEGATIVE NEGATIVE   Staphylococcus aureus NEGATIVE NEGATIVE    Comment: (NOTE) The Xpert SA Assay (FDA approved for NASAL specimens in patients 9 years of age and older), is one component of a comprehensive surveillance program. It is not intended to diagnose infection nor to guide or monitor treatment. Performed at [facility], [address] [place], [place] [address]   POCT Activated clotting time     Status: None   Collection Time: 11/09/23 10:13 AM  Result Value Ref Range   Activated Clotting Time 256 seconds    Comment: Reference range 74-137 seconds for patients not on anticoagulant therapy.  POCT Activated clotting time     Status: None   Collection Time: 11/09/23 10:48 AM  Result Value Ref Range   Activated Clotting Time 210 seconds    Comment: Reference range 74-137 seconds for patients not on anticoagulant therapy.  CBC     Status: Abnormal   Collection Time: 11/09/23 12:47 PM  Result Value Ref Range   WBC 7.2 4.0 - 10.5 K/uL   RBC 3.65 (L) 4.22 - 5.81 MIL/uL   Hemoglobin 10.9 (L) 13.0 - 17.0 g/dL   HCT 66.5 (L) 60.9 - 47.9 %   MCV 91.5 80.0 - 100.0 fL   MCH 29.9 26.0 - 34.0  pg   MCHC 32.6 30.0 - 36.0 g/dL   RDW 86.0 88.4 - 84.4 %   Platelets 171 150 - 400 K/uL   nRBC 0.0 0.0 - 0.2 %    Comment: Performed at [facility], [address] [place], [place] [address]  Creatinine, serum     Status: None   Collection Time: 11/09/23  1:10 PM  Result Value Ref Range   Creatinine, Ser 0.83 0.61 - 1.24 mg/dL   GFR, Estimated >39 >39 mL/min    Comment: (NOTE) Calculated using the CKD-EPI Creatinine Equation (2021) Performed at [facility], [address] [address], [place], [place] [address]   CBC     Status: Abnormal   Collection Time: 11/10/23  4:15 AM  Result Value Ref Range   WBC 10.8 (H) 4.0 - 10.5 K/uL   RBC 3.36 (L) 4.22 - 5.81 MIL/uL   Hemoglobin 9.9 (L) 13.0 - 17.0 g/dL   HCT 69.9 (L) 60.9 - 47.9 %   MCV 89.3 80.0 - 100.0 fL   MCH 29.5 26.0 - 34.0 pg   MCHC 33.0 30.0 - 36.0 g/dL   RDW 85.9 88.4 - 84.4 %   Platelets 160 150 - 400 K/uL   nRBC 0.0 0.0 - 0.2 %    Comment: Performed at [facility], [address] [place], [place] [address]  Basic metabolic panel     Status: Abnormal   Collection Time: 11/10/23  4:15 AM  Result Value Ref Range   Sodium 136 135 - 145 mmol/L   Potassium 4.5 3.5 - 5.1 mmol/L   Chloride 104 98 - 111 mmol/L   CO2 23 22 - 32 mmol/L   Glucose, Bld 161 (H) 70 - 99 mg/dL    Comment: Glucose reference range applies only to samples taken after fasting for at least 8 hours.   BUN 17 8 - 23 mg/dL   Creatinine, Ser 8.95 0.61 - 1.24 mg/dL   Calcium  8.4 (L) 8.9 - 10.3 mg/dL   GFR, Estimated >39 >39 mL/min    Comment: (NOTE) Calculated using the CKD-EPI Creatinine Equation (2021)    Anion gap 9 5 - 15    Comment: Performed at [facility], [address], [place], [place] [address]  Lipid panel     Status: Abnormal   Collection Time: 11/10/23  4:15 AM  Result Value Ref Range   Cholesterol 105 0 - 200 mg/dL   Triglycerides 85 <849 mg/dL   HDL 36 (L) >59 mg/dL   Total CHOL/HDL Ratio 2.9 RATIO   VLDL 17 0 -  40 mg/dL   LDL Cholesterol 52 0 - 99 mg/dL    Comment:        Total Cholesterol/HDL:CHD Risk Coronary Heart Disease Risk Table                     Men   Women  1/2 Average Risk   3.4   3.3  Average Risk       5.0   4.4  2 X Average Risk   9.6   7.1  3 X Average Risk  23.4   11.0        Use the calculated Patient Ratio above and the CHD Risk Table to determine the patient's CHD Risk.        ATP III CLASSIFICATION (LDL):  <100     mg/dL   Optimal  899-870  mg/dL   Near or Above                    Optimal  130-159  mg/dL   Borderline  839-810  mg/dL   High  >809     mg/dL   Very High  Performed at [facility], [address], [place], [place] [address]   POC COVID-19 BinaxNow     Status: Normal   Collection Time: 12/29/23  3:43 PM  Result Value Ref Range   SARS Coronavirus 2 Ag Negative Negative  POCT Influenza A/B     Status: Normal   Collection Time: 12/29/23  3:44 PM  Result Value Ref Range   Influenza A, POC Negative Negative   Influenza B, POC Negative Negative  CBC with Differential     Status: Abnormal   Collection Time: 12/29/23  4:59 PM  Result Value Ref Range   WBC 10.1 4.0 - 10.5 K/uL   RBC 4.03 (L) 4.22 - 5.81 MIL/uL   Hemoglobin 10.6 (L) 13.0 - 17.0 g/dL   HCT 66.4 (L) 60.9 - 47.9 %   MCV 83.1 80.0 - 100.0 fL   MCH 26.3 26.0 - 34.0 pg   MCHC 31.6 30.0 - 36.0 g/dL   RDW 85.5 88.4 - 84.4 %   Platelets 307 150 - 400 K/uL   nRBC 0.0 0.0 - 0.2 %   Neutrophils Relative % 79 %   Neutro Abs 8.0 (H) 1.7 - 7.7 K/uL   Lymphocytes Relative 10 %   Lymphs Abs 1.0 0.7 - 4.0 K/uL   Monocytes Relative 8 %   Monocytes Absolute 0.8 0.1 - 1.0 K/uL   Eosinophils Relative 1 %   Eosinophils Absolute 0.1 0.0 - 0.5 K/uL   Basophils Relative 1 %   Basophils Absolute 0.1 0.0 - 0.1 K/uL   Immature Granulocytes 1 %   Abs Immature Granulocytes 0.05 0.00 - 0.07 K/uL    Comment: Performed at [facility], [address] [place], [place] [address]  Comprehensive  metabolic panel     Status: Abnormal   Collection Time: 12/29/23  4:59 PM  Result Value Ref Range   Sodium 134 (L) 135 - 145 mmol/L   Potassium 3.7 3.5 - 5.1 mmol/L   Chloride 101 98 - 111 mmol/L   CO2 23 22 - 32 mmol/L   Glucose, Bld 125 (H) 70 - 99 mg/dL    Comment: Glucose reference range applies only to samples taken after fasting for at least 8 hours.   BUN 13 8 - 23 mg/dL   Creatinine, Ser 9.21 0.61 - 1.24 mg/dL   Calcium  8.9 8.9 - 10.3 mg/dL   Total Protein 6.3 (L) 6.5 - 8.1 g/dL   Albumin  2.6 (L) 3.5 - 5.0 g/dL   AST 25 15 - 41 U/L   ALT 16 0 - 44 U/L   Alkaline Phosphatase 66 38 - 126 U/L   Total Bilirubin 0.5 0.0 - 1.2 mg/dL   GFR, Estimated >39 >39 mL/min    Comment: (NOTE) Calculated using the CKD-EPI Creatinine Equation (2021)    Anion gap 10 5 - 15    Comment: Performed at [facility], [address] [place], [place] [address]  Magnesium      Status: None   Collection Time: 12/29/23  4:59 PM  Result Value Ref Range   Magnesium  2.1 1.7 - 2.4 mg/dL    Comment: Performed at [facility], [address], [place], [place] [address]  D-dimer, quantitative     Status: Abnormal   Collection Time: 12/29/23  6:45 PM  Result Value Ref Range   D-Dimer, Quant 3.42 (H) 0.00 - 0.50 ug/mL-FEU    Comment: (NOTE) At the manufacturer cut-off value of 0.5 g/mL FEU, this  assay has a negative predictive value of 95-100%.This assay is intended for use in conjunction with a clinical pretest probability (PTP) assessment model to exclude pulmonary embolism (PE) and deep venous thrombosis (DVT) in outpatients suspected of PE or DVT. Results should be correlated with clinical presentation. Performed at [facility], [address] [place], [place] [address]   Urinalysis, Routine w reflex microscopic -Urine, Clean Catch     Status: Abnormal   Collection Time: 12/29/23  8:40 PM  Result Value Ref Range   Color, Urine YELLOW YELLOW   APPearance CLEAR CLEAR    Specific Gravity, Urine >1.046 (H) 1.005 - 1.030   pH 5.0 5.0 - 8.0   Glucose, UA NEGATIVE NEGATIVE mg/dL   Hgb urine dipstick NEGATIVE NEGATIVE   Bilirubin Urine NEGATIVE NEGATIVE   Ketones, ur NEGATIVE NEGATIVE mg/dL   Protein, ur NEGATIVE NEGATIVE mg/dL   Nitrite NEGATIVE NEGATIVE   Leukocytes,Ua NEGATIVE NEGATIVE   RBC / HPF 0-5 0 - 5 RBC/hpf   WBC, UA 0-5 0 - 5 WBC/hpf   Bacteria, UA NONE SEEN NONE SEEN   Squamous Epithelial / HPF 0-5 0 - 5 /HPF   Mucus PRESENT    Hyaline Casts, UA PRESENT     Comment: Performed at [facility], [address] [place], [place] [address]        Beverley Adine Hummer, MD, MS

## 2024-01-12 NOTE — Addendum Note (Signed)
 Addended by: Maleeyah Mccaughey on: 01/12/2024 11:57 AM   Modules accepted: Orders

## 2024-01-12 NOTE — Patient Instructions (Signed)
  VISIT SUMMARY: You came in today for a follow-up on your chest pain. You have been experiencing weakness, shortness of breath, and significant coughing with phlegm and blood. You have a history of multifocal pneumonia and atrial fibrillation. You have completed a course of antibiotics but still have persistent symptoms. You are currently on medications for your heart condition and have a history of heavy smoking, emphysema, and COPD. You have also experienced significant weight loss and are concerned about your appetite and nutrition.  YOUR PLAN: -MULTIFOCAL PNEUMONIA: Multifocal pneumonia is an infection that affects multiple areas of your lungs. You have completed antibiotics, but your symptoms suggest ongoing inflammation and infection. We will order a repeat CT scan in one month to check your lung condition and rule out any malignancy. You will also be referred to a lung specialist for further assessment. In the meantime, you will receive a nebulizer treatment in the office today and start on prednisone  50 mg daily for 5 days. Continue using your albuterol  inhaler as needed for shortness of breath.  -CHRONIC OBSTRUCTIVE PULMONARY DISEASE (COPD): COPD is a chronic lung disease that makes it hard to breathe. Your symptoms are worsened by your pneumonia. We will refer you to a lung specialist for further management. You will receive a nebulizer treatment in the office today and start on prednisone  50 mg daily for 5 days. It is very important that you stop smoking to reduce the risk of further complications.  -ATRIAL FIBRILLATION WITH RAPID VENTRICULAR RESPONSE (RVR): Atrial fibrillation with RVR is a heart condition where your heart beats irregularly and often rapidly. This can be worsened by your respiratory illness. You should continue taking rivaroxaban  20 mg daily and dofetilide  500 mcg twice daily. Follow up with your cardiologists, Dr. Dorn Lesches and Dr. Soyla Mu, for ongoing  management.  -GENERAL HEALTH MAINTENANCE: As a heavy smoker, you are at increased risk for cardiovascular disease, lung cancer, and worsening of COPD and pneumonia. It is critical that you stop smoking to improve your overall health and reduce these risks.  INSTRUCTIONS: Please schedule a repeat CT scan in one month to assess your lung condition. Follow up with a pulmonologist for further assessment and management of your lung disease. Continue taking your medications as prescribed and use your albuterol  inhaler as needed. Follow up with your cardiologists, Dr. Dorn Lesches and Dr. Soyla Mu, for ongoing management of your atrial fibrillation. It is very important that you stop smoking to improve your overall health.

## 2024-01-19 ENCOUNTER — Ambulatory Visit: Attending: Vascular Surgery | Admitting: Physician Assistant

## 2024-01-19 ENCOUNTER — Ambulatory Visit (HOSPITAL_COMMUNITY)
Admission: RE | Admit: 2024-01-19 | Discharge: 2024-01-19 | Disposition: A | Discharge status: Home / Self Care | Source: Ambulatory Visit | Attending: Vascular Surgery | Admitting: Vascular Surgery

## 2024-01-19 ENCOUNTER — Ambulatory Visit (HOSPITAL_BASED_OUTPATIENT_CLINIC_OR_DEPARTMENT_OTHER)
Admission: RE | Admit: 2024-01-19 | Discharge: 2024-01-19 | Discharge status: Home / Self Care | Source: Ambulatory Visit | Attending: Physician Assistant

## 2024-01-19 VITALS — BP 107/73 | HR 115 | Temp 98.0°F | Ht 73.0 in | Wt 150.0 lb

## 2024-01-19 DIAGNOSIS — I7025 Atherosclerosis of native arteries of other extremities with ulceration: Secondary | ICD-10-CM | POA: Diagnosis not present

## 2024-01-19 DIAGNOSIS — I70221 Atherosclerosis of native arteries of extremities with rest pain, right leg: Secondary | ICD-10-CM | POA: Insufficient documentation

## 2024-01-19 DIAGNOSIS — I739 Peripheral vascular disease, unspecified: Secondary | ICD-10-CM

## 2024-01-19 LAB — VAS US ABI WITH/WO TBI
Left ABI: 0.54
Right ABI: 0.5

## 2024-01-19 NOTE — Progress Notes (Signed)
 POST OPERATIVE OFFICE NOTE    CC:  F/u for surgery  HPI:  This is a 73 y.o. male who is s/p right common femoral endarterectomy including profundoplasty and endarterectomy of the proximal SFA with bovine pericardial patch angioplasty on 11/09/23 by Dr. Gretta. 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 At his initial post operative visit in June he was doing well. His groin incision was healing very well and his Achilles wound was also healing well.   Prior to his RLE intervention he underwent 1.  Aortogram with left iliac arteriogram 2.  Angioplasty and stent of left common iliac artery via retrograde access (9 mm x 39 mm VBX and 9 mm x 29 mm VBX)3.  Angioplasty and stent of the left external iliac artery via retrograde access (7 mm x 80 mm drug-coated Eluvia postdilated with a 6 mm angioplasty balloon) 4.  Left common femoral endarterectomy including profundoplasty and endarterectomy the proximal SFA with bovine pericardial patch angioplastyon 09/02/23 by Dr. Gretta.  This was for bilateral lifestyle limiting claudication. He had undergone Angiogram of BLE by Dr. Court in January that showed severely diseased Common femoral arteries and occluded SFA's bilaterally. He was indicated for staged Left then Right lower extremity intervention.  Pt returns today for follow up with non invasive studies.  Pt states he has not been doing well. He currently is very short of breath from afib. He also was just evaluated in ED for pneumonia vs possible cancer. He has been having unintentional weight loss, loss of appetite. Some blood in urine as well as constipation. He is a patient of Dr. Court and has follow up coming up with him. He is also scheduled to see Pulmonologist on Thursday 7/24. In regard to his legs he says he felt that he was doing better after the left leg surgery, however since the right he really feels both legs are not doing well. He still has small ulcer on right Achilles that he is  treating himself. He says this is stable over all. He would not allow me to take off the dressing today in an effort to not unroof it. He says he likes to remove it in the shower when the dressing is saturated. He still is experiencing pain and a lot of coldness in his feet. Between his leg pain and respiratory status he says he has been very sedentary. He says he understands that this is probably contributing to his circulation worsening. He is medically managed on Aspirin , Xarelto  and Statin  Allergies  Allergen Reactions   Cymbalta  [Duloxetine  Hcl]    Effexor [Venlafaxine]     Feels bad   Wellbutrin  [Bupropion ]     sleepy   Zoloft [Sertraline Hcl]     Felt bad    Current Outpatient Medications  Medication Sig Dispense Refill   albuterol  (PROVENTIL ) (2.5 MG/3ML) 0.083% nebulizer solution Take 3 mLs (2.5 mg total) by nebulization every 6 (six) hours as needed for wheezing or shortness of breath. 150 mL 1   albuterol  (VENTOLIN  HFA) 108 (90 Base) MCG/ACT inhaler Use  2 inhalations  15 minutes apart  every 4 hours  to rescue Asthma Attack 48 g 3   atorvastatin  (LIPITOR ) 80 MG tablet Take 1 tablet Daily for Cholesterol (Patient taking differently: Take 80 mg by mouth daily. Take 1 tablet Daily for Cholesterol) 90 tablet 0   Cholecalciferol  250 MCG (10000 UT) CAPS Take 10,000 Units by mouth every evening.     dofetilide  (  TIKOSYN ) 500 MCG capsule TAKE 1 CAPSULE BY MOUTH 2 TIMES DAILY. 180 capsule 3   gabapentin  (NEURONTIN ) 300 MG capsule Take 1 capsule 3 x /day for Neuropathy Pain 270 capsule 3   ketoconazole  (NIZORAL ) 2 % cream Apply 1 Application topically daily as needed for irritation.     oxyCODONE  (OXY IR/ROXICODONE ) 5 MG immediate release tablet Take 1 tablet (5 mg total) by mouth every 6 (six) hours as needed for severe pain (pain score 7-10). 30 tablet 0   predniSONE  (DELTASONE ) 50 MG tablet Take 1 tablet daily for 5 days. 5 tablet 0   pyridOXINE (VITAMIN B-6) 100 MG tablet Take 100 mg by  mouth every evening.     rivaroxaban  (XARELTO ) 20 MG TABS tablet Take 1 tablet (20 mg total) by mouth daily with supper. 30 tablet 6   triamcinolone  cream (KENALOG ) 0.5 % Apply 1 Application topically daily as needed (skin irritation).     aspirin  EC 81 MG tablet Take 81 mg by mouth daily. Swallow whole. (Patient not taking: Reported on 01/19/2024)     No current facility-administered medications for this visit.     ROS:  See HPI  Physical Exam:  Vitals:   01/19/24 0951  BP: 107/73  Pulse: (!) 115  Temp: 98 F (36.7 C)  SpO2: 95%   General: thin appearing, in no distress Cardiac: irregular Lungs: non labored Incision:  B groin incisions are well healed Extremities:  BLE with doppler right DP, faint PT, Left DP/PT signals. Right Achilles wound dressed Neuro: alert and oriented Abdomen:  flat, soft  Non invasive Vascular lab: +-------+-----------+-----------+------------+------------+  ABI/TBIToday's ABIToday's TBIPrevious ABIPrevious TBI  +-------+-----------+-----------+------------+------------+  Right 0.5        0.0        0.28        0.0           +-------+-----------+-----------+------------+------------+  Left  0.54       0.24       0.64        0.3           +-------+-----------+-----------+------------+------------+   VAS US  lower extremity arterial duplex right: Summary:  Right: 50-74% stenosis noted in the deep femoral artery. Total occlusion  noted in the superficial femoral artery.   Assessment/Plan:  This is a 73 y.o. male who is s/p: right common femoral endarterectomy including profundoplasty and endarterectomy of the proximal SFA with bovine pericardial patch angioplasty on 11/09/23 by Dr. Gretta. 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 He had previously undergone intervention on his left lower extremity with common Iliac artery stenting and CF endarterectomy back in March for lifestyle limiting claudication. His groin wounds  are well healed. He continues to have pain and coldness in his feet. He also still has a right Achilles wound. I discussed with patient recommendation to get his other more pressing medical issues sorted out. We would not consider doing surgery at this time with his pulmonary status as well as symptomatic atrial fibrillation.  - He has follow ups arranged with Pulmonology and Dr. Court and I have recommended he see his PCP as well - He will continue Aspirin , Statin, and Xarelto  - F/u 4-6 weeks to talk with Dr. Gretta about possible RLE bypass   Curry Damme, [facility] [phone]   Clinic MD:  Fleeta Robertson

## 2024-01-21 ENCOUNTER — Encounter: Payer: Self-pay | Admitting: Pulmonary Disease

## 2024-01-21 ENCOUNTER — Ambulatory Visit: Admitting: Pulmonary Disease

## 2024-01-21 VITALS — BP 122/68 | HR 87 | Temp 97.1°F | Ht 73.0 in | Wt 152.0 lb

## 2024-01-21 DIAGNOSIS — R062 Wheezing: Secondary | ICD-10-CM | POA: Diagnosis not present

## 2024-01-21 DIAGNOSIS — J9801 Acute bronchospasm: Secondary | ICD-10-CM

## 2024-01-21 DIAGNOSIS — I4819 Other persistent atrial fibrillation: Secondary | ICD-10-CM | POA: Diagnosis not present

## 2024-01-21 DIAGNOSIS — J8410 Pulmonary fibrosis, unspecified: Secondary | ICD-10-CM

## 2024-01-21 DIAGNOSIS — R042 Hemoptysis: Secondary | ICD-10-CM | POA: Diagnosis not present

## 2024-01-21 DIAGNOSIS — J189 Pneumonia, unspecified organism: Secondary | ICD-10-CM

## 2024-01-21 DIAGNOSIS — J439 Emphysema, unspecified: Secondary | ICD-10-CM | POA: Diagnosis not present

## 2024-01-21 MED ORDER — BREZTRI AEROSPHERE 160-9-4.8 MCG/ACT IN AERO: 2.0000 | INHALATION_SPRAY | Freq: Two times a day (BID) | RESPIRATORY_TRACT | 0 refills | Status: DC

## 2024-01-21 MED ORDER — IPRATROPIUM-ALBUTEROL 0.5-2.5 (3) MG/3ML IN SOLN
3.0000 mL | Freq: Once | RESPIRATORY_TRACT | Status: AC
  Administered 2024-01-21: 3 mL via RESPIRATORY_TRACT

## 2024-01-21 NOTE — Progress Notes (Addendum)
 Subjective:    Patient ID: Bradley Ferguson, male    DOB: February 11, 1951, 73 y.o.   MRN: [id number]  Patient Care Team: Sebastian Beverley NOVAK, MD as PCP - General (Family Medicine) Inocencio Soyla Lunger, MD as PCP - Electrophysiology (Cardiology) Court Dorn PARAS, MD as PCP - Cardiology (Cardiology) Court Dorn PARAS, MD as Consulting Physician (Cardiology) Abran Norleen SAILOR, MD as Consulting Physician (Gastroenterology) Octavia Bruckner, MD as Consulting Physician (Ophthalmology) Quinn Odor, [facility] (Inactive) as Pharmacist (Pharmacist)  Chief Complaint  Patient presents with   Follow-up - ED    ER for pneumonia 7/1. SOB. Wheezing in the mornings. Cough in the morning with white/clear.     BACKGROUND: Patient is a 73 year old current smoker followed by Dr. Theophilus last visit 18 December 2021.  Has a history of pulmonary fibrosis and emphysema.  He presents today as a follow-up from an ED visit on 29 December 2023 for pneumonia.  Treated with prednisone , Augmentin  and azithromycin .  Completed course of same.  Usually follows at our [facility] office but no available appointments.  HPI Discussed the use of AI scribe software for clinical note transcription with the patient, who gave verbal consent to proceed.  History of Present Illness   Bradley Ferguson is a 73 year old male with COPD and pulmonary fibrosis who presents for a post-hospital visit after pneumonia. He was referred by Dr. Beverley Sebastian for follow-up after an ER visit for pneumonia.  He was recently in the ER for pneumonia, where he received intravenous antibiotics and was discharged with a prescription. Following the ER visit, he experienced increased production of phlegm, which was heavy and contained streaks of blood. This symptom has mostly cleared up.  He has a history of COPD and pulmonary fibrosis and has been evaluated by Dr. Theophilus. He is currently using albuterol  as a rescue inhaler and has completed a course of prednisone  and  antibiotics, which have helped alleviate his symptoms. He does not have a nebulizer at home and has not been using a maintenance inhaler regularly.  He also has a history of atrial fibrillation (AFib) for 25 years, initially discovered during a routine visit to his general practitioner. He has undergone RF ablation in the past and is currently on Tikosyn , a medication that requires hospitalization for initiation. He missed a dose previously, which required re-hospitalization for monitoring.  He has been maintained on Xarelto  however, he is on hiatus of this medication due to hematuria (has upcoming urology appointment).  He experienced pain in his chest and lower back, particularly with deep inhalation.  This has resolved with the antibiotics and prednisone .   Review of Systems A 10 point review of systems was performed and it is as noted above otherwise negative.   Past Medical History:  Diagnosis Date   Anxiety    Atrial fibrillation (HCC)    Blood transfusion without reported diagnosis    Cataract    Depression    Emphysema of lung (HCC)    Heart murmur    Hyperlipidemia    Hypertension    PVD (peripheral vascular disease) (HCC)    Raynaud's syndrome    Substance abuse (HCC)    Tobacco   Systemic sclerosis (HCC)    TB lung, latent 12/19/2019   s/p INH x 3 months   Transaminitis 06/18/2020    Past Surgical History:  Procedure Laterality Date   ABDOMINAL AORTOGRAM W/LOWER EXTREMITY Left 07/09/2023   Procedure: ABDOMINAL AORTOGRAM W/LOWER EXTREMITY;  Surgeon: Court Dorn PARAS,  MD;  Location: [facility] INVASIVE CV LAB;  Service: Cardiovascular;  Laterality: Left;   ATRIAL FIBRILLATION ABLATION     CATARACT EXTRACTION Right    COLONOSCOPY     ENDARTERECTOMY FEMORAL Left 09/02/2023   Procedure: LEFT LOWER EXTREMITY ANGIOGRAM WITH LEFT ILIAC  STENTING;  Surgeon: Gretta Lonni PARAS, MD;  Location: [facility] OR;  Service: Vascular;  Laterality: Left;   ENDARTERECTOMY FEMORAL Right 11/09/2023    Procedure: ENDARTERECTOMY, FEMORAL TO PROFUNDA;  Surgeon: Gretta Lonni PARAS, MD;  Location: [facility] OR;  Service: Vascular;  Laterality: Right;   EYE SURGERY     FEMORAL-POPLITEAL BYPASS GRAFT Left 09/02/2023   Procedure: LEFT FEMORAL ENDARTARECTOMY WITH PATCH ANGIOPLASTY;  Surgeon: Gretta Lonni PARAS, MD;  Location: [facility] OR;  Service: Vascular;  Laterality: Left;   FRACTURE SURGERY Left 1976   HERNIA REPAIR  2023   INGUINAL HERNIA REPAIR Right 03/23/2020   Procedure: OPEN RIGHT INGUINAL HERNIA REPAIR WITH MESH;  Surgeon: Gladis Cough, MD;  Location: [facility] OR;  Service: General;  Laterality: Right;   INSERTION OF MESH Right 03/23/2020   Procedure: INSERTION OF MESH;  Surgeon: Gladis Cough, MD;  Location: [facility] OR;  Service: General;  Laterality: Right;   LOWER EXTREMITY INTERVENTION N/A 04/27/2017   Procedure: LOWER EXTREMITY INTERVENTION;  Surgeon: Court Dorn PARAS, MD;  Location: [facility] INVASIVE CV LAB;  Service: Cardiovascular;  Laterality: N/A;   PATCH ANGIOPLASTY Right 11/09/2023   Procedure: ANGIOPLASTY, USING PATCH GRAFT, USING XENOSURE 1X14CM;  Surgeon: Gretta Lonni PARAS, MD;  Location: [facility] OR;  Service: Vascular;  Laterality: Right;   PERIPHERAL VASCULAR ATHERECTOMY Right 04/27/2017   Procedure: PERIPHERAL VASCULAR ATHERECTOMY;  Surgeon: Court Dorn PARAS, MD;  Location: [facility] INVASIVE CV LAB;  Service: Cardiovascular;  Laterality: Right;  Iliac   PERIPHERAL VASCULAR INTERVENTION Right 04/27/2017   Procedure: PERIPHERAL VASCULAR INTERVENTION;  Surgeon: Court Dorn PARAS, MD;  Location: [facility] INVASIVE CV LAB;  Service: Cardiovascular;  Laterality: Right;  Iliac    Patient Active Problem List   Diagnosis Date Noted   Multifocal lung consolidation (HCC) 01/12/2024   Critical limb ischemia of right lower extremity (HCC) 11/09/2023   Atherosclerosis of lower extremity with claudication (HCC) 11/09/2023   Aortoiliac occlusive disease (HCC) 09/02/2023   Critical limb ischemia of left lower  extremity (HCC) 07/28/2023   Elevated coronary artery calcium  score 07/13/2023   Hepatic steatosis 07/05/2020   Corn of foot 06/12/2020   Elevated LFTs 06/12/2020   Essential hypertension 04/29/2020   Right inguinal hernia 03/19/2020   TB lung, latent 12/19/2019   Systemic scleroses (HCC) 12/19/2019   Centrilobular emphysema (HCC) 11/24/2019   Interstitial pulmonary disease (HCC) 11/24/2019   Positive ANA (antinuclear antibody) 11/24/2019   Atherosclerosis of aorta (HCC) by Chest CT scan on 01/19/2020 06/30/2019   Chronic obstructive pulmonary disease (HCC) 06/30/2019   Acquired thrombophilia (HCC) 05/10/2019   Colon polyps 12/06/2018   Visit for monitoring Tikosyn  therapy 04/27/2018   Peripheral arterial disease (HCC) 04/16/2017   Labile hypertension 02/29/2016   BPH 02/29/2016   Mixed hyperlipidemia 11/12/2015   Vitamin D  deficiency 11/12/2015   Bipolar I disorder (HCC) 08/13/2015   Atrial fibrillation (HCC) 08/13/2015   Attention deficit disorder 08/08/2010   Dental caries 08/08/2010   ANXIETY DEPRESSION 06/06/2010   TOBACCO ABUSE 06/06/2010   RAYNAUDS SYNDROME 06/06/2010    Family History  Problem Relation Age of Onset   Cancer Mother        small cell lung cancer   Cancer Father  unsure type   Colon cancer Neg Hx    Rectal cancer Neg Hx    Heart disease Neg Hx    Pancreatic cancer Neg Hx    Stomach cancer Neg Hx    Esophageal cancer Neg Hx     Social History   Tobacco Use   Smoking status: Every Day    Current packs/day: 0.50    Average packs/day: 1.1 packs/day for 95.4 years (108.9 ttl pk-yrs)    Types: Cigarettes    Start date: 59   Smokeless tobacco: Never   Tobacco comments:    Smokes 2 cigarettes daily- khj 01/21/2024        Smoked 1 PPD at his heaviest.  Substance Use Topics   Alcohol use: Yes    Alcohol/week: 2.0 standard drinks of alcohol    Types: 2 Standard drinks or equivalent per week    Comment: Social    Allergies  Allergen  Reactions   Cymbalta  [Duloxetine  Hcl]    Effexor [Venlafaxine]     Feels bad   Wellbutrin  [Bupropion ]     sleepy   Zoloft [Sertraline Hcl]     Felt bad    Current Meds  Medication Sig   albuterol  (VENTOLIN  HFA) 108 (90 Base) MCG/ACT inhaler Use  2 inhalations  15 minutes apart  every 4 hours  to rescue Asthma Attack (Patient not taking: Reported on 01/22/2024)   atorvastatin  (LIPITOR ) 80 MG tablet Take 1 tablet Daily for Cholesterol (Patient taking differently: Take 80 mg by mouth daily. Take 1 tablet Daily for Cholesterol)   budesonide -glycopyrrolate -formoterol  (BREZTRI  AEROSPHERE) 160-9-4.8 MCG/ACT AERO inhaler Inhale 2 puffs into the lungs in the morning and at bedtime.   Cholecalciferol  250 MCG (10000 UT) CAPS Take 10,000 Units by mouth every evening.   dofetilide  (TIKOSYN ) 500 MCG capsule TAKE 1 CAPSULE BY MOUTH 2 TIMES DAILY.   gabapentin  (NEURONTIN ) 300 MG capsule Take 1 capsule 3 x /day for Neuropathy Pain   ketoconazole  (NIZORAL ) 2 % cream Apply 1 Application topically daily as needed for irritation.   oxyCODONE  (OXY IR/ROXICODONE ) 5 MG immediate release tablet Take 1 tablet (5 mg total) by mouth every 6 (six) hours as needed for severe pain (pain score 7-10). (Patient not taking: Reported on 01/22/2024)   predniSONE  (DELTASONE ) 50 MG tablet Take 1 tablet daily for 5 days. (Patient not taking: Reported on 01/22/2024)   pyridOXINE  (VITAMIN B-6) 100 MG tablet Take 100 mg by mouth every evening.   rivaroxaban  (XARELTO ) 20 MG TABS tablet Take 1 tablet (20 mg total) by mouth daily with supper.   triamcinolone  cream (KENALOG ) 0.5 % Apply 1 Application topically daily as needed (skin irritation).   [DISCONTINUED] aspirin  EC 81 MG tablet Take 81 mg by mouth daily. Swallow whole. (Patient not taking: Reported on 01/22/2024)    Immunization History  Administered Date(s) Administered   Influenza Whole 04/30/2010   Influenza, High Dose Seasonal PF 03/20/2017, 03/29/2018, 04/06/2019,  04/30/2020, 05/08/2021, 03/31/2023   Moderna SARS-COV2 Booster Vaccination 05/12/2020   Moderna Sars-Covid-2 Vaccination 05/12/2020   PFIZER Comirnaty(Gray Top)Covid-19 Tri-Sucrose Vaccine 03/20/2022   PFIZER(Purple Top)SARS-COV-2 Vaccination 08/04/2019, 08/29/2019   Pfizer Covid-19 Vaccine Bivalent Booster 38yrs & up 05/11/2021   Pneumococcal Conjugate-13 02/05/2017   Pneumococcal Polysaccharide-23 04/30/2010, 03/29/2018   Td 06/30/2013   Zoster Recombinant(Shingrix) 02/09/2023        Objective:     BP 122/68 (BP Location: Right Arm, Cuff Size: Normal)   Pulse 87   Temp (!) 97.1 F (36.2 C)  Ht 6' 1 (1.854 m)   Wt 152 lb (68.9 kg)   SpO2 98%   BMI 20.05 kg/m   SpO2: 98 % O2 Device: None (Room air)  GENERAL: Thin, well-developed gentleman, no acute distress.  Fully ambulatory, no conversational dyspnea. HEAD: Normocephalic, atraumatic.  EYES: Pupils equal, round, reactive to light.  No scleral icterus.  MOUTH: Edentulous, wears dentures, no thrush. NECK: Supple. No thyromegaly. Trachea midline. No JVD.  No adenopathy. PULMONARY: Fair air entry bilaterally, symmetrical.  Scattered rhonchi and wheezes throughout.SABRA CARDIOVASCULAR: S1 and S2. Irregular rate and rhythm.  Grade 2/6 aortic stenosis murmur. ABDOMEN: Benign. MUSCULOSKELETAL: No joint deformity, no clubbing, no edema.  NEUROLOGIC: No overt focal deficit. SKIN: Intact,warm,dry. PSYCH: Anxious mood.  Irascible at times.  Patient received nebulizer treatment with DuoNeb x 1: Auscultation after nebulizer therapy showed improve air entry and decrease in rhonchi and wheezes.     Representative image from CT performed 29 December 2023 showing left upper lobe masslike infiltrate (arrow):   Assessment & Plan:     ICD-10-CM   1. Combined pulmonary fibrosis and emphysema (CPFE) (HCC)  J43.9    J84.10     2. Community acquired pneumonia of left lung, unspecified part of lung  J18.9     3. Cough with hemoptysis - minor   R04.2     4. Bronchospasm  J98.01 ipratropium-albuterol  (DUONEB) 0.5-2.5 (3) MG/3ML nebulizer solution 3 mL    5. Persistent atrial fibrillation (HCC)  I48.19      Meds ordered this encounter  Medications   ipratropium-albuterol  (DUONEB) 0.5-2.5 (3) MG/3ML nebulizer solution 3 mL   budesonide -glycopyrrolate -formoterol  (BREZTRI  AEROSPHERE) 160-9-4.8 MCG/ACT AERO inhaler    Sig: Inhale 2 puffs into the lungs in the morning and at bedtime.    Dispense:  3 each    Refill:  0    Lot Number?:   [id number]    Expiration Date?:   04/30/2026    Manufacturer?:   [employer] [[id number]]    Quantity:   3             each   Discussion:    Pneumonia Recent hospitalization for pneumonia with significant phlegm production, including blood streaks. Symptoms have mostly resolved, with some residual morning phlegm. Pain on deep inhalation noted on the side of previous pneumonia, resolved. - Order follow-up chest CT on August 1st to assess resolution of pneumonia - Administer Duoneb treatment in office to clear residual symptoms - Follow-up after CT scan at our [facility] office where patient is usually followed  Chronic Obstructive Pulmonary Disease (COPD) COPD with recent exacerbation requiring antibiotics, prednisone , and albuterol . No maintenance inhaler currently in use. Previous trial of Trelegy was effective but cost-prohibitive. Breztri  offered as a potentially more affordable alternative. - Provide samples of Breztri  for maintenance therapy, instruct to use two puffs twice a day and rinse mouth after use - Schedule follow-up appointment at [facility] office in a few weeks after CT scan - Instruct to contact office if any problems arise before follow-up  Pulmonary Fibrosis Pulmonary fibrosis with overlapping symptoms with COPD.  Atrial Fibrillation Long-standing atrial fibrillation, previously managed with Tikosyn  and RF ablation. Recent ER visit due to AFib noted during GP visit, but no acute  management changes discussed.  Patient has follow-up with cardiology upcoming.  Follow-up Follow-up care coordination discussed, including upcoming appointments and imaging. - Confirm chest CT appointment on August 1st at [facility] - Schedule follow-up appointment at [facility] office after CT  scan - Provide printed instructions and appointment details     Advised if symptoms do not improve or worsen, to please contact office for sooner follow up or seek emergency care.    I spent 50 minutes of dedicated to the care of this patient on the date of this encounter to include pre-visit review of records, face-to-face time with the patient discussing conditions above, post visit ordering of testing, clinical documentation with the electronic health record, making appropriate referrals as documented, and communicating necessary findings to members of the patients care team.   C. Leita Sanders, MD Advanced Bronchoscopy P[facility]    *This note was dictated using voice recognition software/Dragon.  Despite best efforts to proofread, errors can occur which can change the meaning. Any transcriptional errors that result from this process are unintentional and may not be fully corrected at the time of dictation.

## 2024-01-21 NOTE — Patient Instructions (Signed)
 VISIT SUMMARY:  You had a follow-up visit today after your recent emergency department visit for pneumonia. We discussed your symptoms, including the increased phlegm production and chest pain, and reviewed your history of COPD, pulmonary fibrosis, and atrial fibrillation. We also talked about your current medications and made some adjustments to your treatment plan.  YOUR PLAN:  -PNEUMONIA: Pneumonia is an infection that inflames the air sacs in one or both lungs. You had a recent hospitalization for pneumonia, and while most of your symptoms have resolved, you still have some residual phlegm and chest pain. We will do a follow-up chest CT on August 1st to ensure the pneumonia has cleared up. You also received a Duoneb treatment in the office today to help with any remaining symptoms.  -CHRONIC OBSTRUCTIVE PULMONARY DISEASE (COPD): COPD is a chronic inflammatory lung disease that obstructs airflow from the lungs. You had a recent exacerbation that required antibiotics, prednisone , and albuterol . We are starting you on Breztri  for maintenance therapy, which you should use two puffs twice a day and rinse your mouth after each use. We will follow up in a few weeks after your CT scan of the chest to see how you are doing.  -PULMONARY FIBROSIS: Pulmonary fibrosis is a lung disease that occurs when lung tissue becomes damaged and scarred. Your symptoms overlap with those of COPD, and we will continue to monitor your condition closely.  After your next visit you will be reconnected with Dr. Theophilus who was following this issue for you.  -ATRIAL FIBRILLATION: Atrial fibrillation is an irregular and often rapid heart rate that can increase your risk of strokes, heart failure, and other heart-related complications. You have been managing this with Tikosyn  and had a previous RF ablation. No changes were made to your current management plan today.  INSTRUCTIONS:  Please confirm your chest CT appointment on  August 1st at [facility]. Schedule a follow-up appointment at our [facility] office after your CT scan. If you experience any problems before your follow-up, contact our office immediately. You will receive printed instructions and appointment details.

## 2024-01-21 NOTE — Progress Notes (Unsigned)
 Cardiology Office Note:    Date:  01/22/2024   ID:  Bradley Ferguson, DOB Jul 02, 1950, MRN [id number]  PCP:  Bradley Beverley NOVAK, MD   [facility] Providers Cardiologist:  Dorn Lesches, MD Electrophysiologist:  Will Gladis Norton, MD     Referring MD: Bradley Beverley NOVAK, MD   Chief Complaint  Patient presents with   Hospitalization Follow-up    AFib    History of Present Illness:    Bradley Ferguson is a 73 y.o. male with a hx of labile hypertension, PAD, Raynaud's syndrome, CAD, pulmonary fibrosis and emphysema, hyperlipidemia, atrial flutter, atrial fibrillation, and tobacco abuse.  He underwent flutter ablation in 2011 and maintained on dofetilide  for atrial fibrillation.  He is anticoagulated with Xarelto .  He follows with Dr. Lesches for PAD with prior PTCA and stenting to right common iliac artery in 2018 but was ultimately referred to [facility] for surgical evaluation.  Due to elevated coronary calcium  score in January 2025 that was 1483, nuclear stress test was performed 07/20/2023 that was read as low risk, nonischemic.  He underwent PTA/stenting to the left CIA and left CFA endarterectomy for critical limb ischemia in May 2025.  He presented to the ER 12/29/2023 with PAF and chest pain.  He has struggled with hematuria with a negative workup with urology and self discontinued Xarelto  several weeks prior.  CXR showed left upper lung zone masslike opacity.  CTA negative for PE but new consolidation in the left upper lobe felt related to pneumonia.  He was treated with oral antibiotics and discharged without admission.  He presents today for cardiology follow up. He is frustrated that there is no plan to get him back out of Afib. Still recovering from PNA.   EKG today showed sinus rhythm.  Past Medical History:  Diagnosis Date   Anxiety    Atrial fibrillation (HCC)    Blood transfusion without reported diagnosis    Cataract    Depression    Emphysema of lung (HCC)    Heart  murmur    Hyperlipidemia    Hypertension    PVD (peripheral vascular disease) (HCC)    Raynaud's syndrome    Substance abuse (HCC)    Tobacco   Systemic sclerosis (HCC)    TB lung, latent 12/19/2019   s/p INH x 3 months   Transaminitis 06/18/2020    Past Surgical History:  Procedure Laterality Date   ABDOMINAL AORTOGRAM W/LOWER EXTREMITY Left 07/09/2023   Procedure: ABDOMINAL AORTOGRAM W/LOWER EXTREMITY;  Surgeon: Lesches Dorn PARAS, MD;  Location: [facility];  Service: Cardiovascular;  Laterality: Left;   ATRIAL FIBRILLATION ABLATION     CATARACT EXTRACTION Right    COLONOSCOPY     ENDARTERECTOMY FEMORAL Left 09/02/2023   Procedure: LEFT LOWER EXTREMITY ANGIOGRAM WITH LEFT ILIAC  STENTING;  Surgeon: Gretta Lonni PARAS, MD;  Location: [facility] OR;  Service: Vascular;  Laterality: Left;   ENDARTERECTOMY FEMORAL Right 11/09/2023   Procedure: ENDARTERECTOMY, FEMORAL TO PROFUNDA;  Surgeon: Gretta Lonni PARAS, MD;  Location: [facility] OR;  Service: Vascular;  Laterality: Right;   EYE SURGERY     FEMORAL-POPLITEAL BYPASS GRAFT Left 09/02/2023   Procedure: LEFT FEMORAL ENDARTARECTOMY WITH PATCH ANGIOPLASTY;  Surgeon: Gretta Lonni PARAS, MD;  Location: [facility] OR;  Service: Vascular;  Laterality: Left;   FRACTURE SURGERY Left 1976   HERNIA REPAIR  2023   INGUINAL HERNIA REPAIR Right 03/23/2020   Procedure: OPEN RIGHT INGUINAL HERNIA REPAIR WITH MESH;  Surgeon: Gladis Cough,  MD;  Location: [facility] OR;  Service: General;  Laterality: Right;   INSERTION OF MESH Right 03/23/2020   Procedure: INSERTION OF MESH;  Surgeon: Gladis Cough, MD;  Location: [facility] OR;  Service: General;  Laterality: Right;   LOWER EXTREMITY INTERVENTION N/A 04/27/2017   Procedure: LOWER EXTREMITY INTERVENTION;  Surgeon: Court Dorn PARAS, MD;  Location: [facility];  Service: Cardiovascular;  Laterality: N/A;   PATCH ANGIOPLASTY Right 11/09/2023   Procedure: ANGIOPLASTY, USING PATCH GRAFT, USING XENOSURE 1X14CM;   Surgeon: Gretta Lonni PARAS, MD;  Location: [facility] OR;  Service: Vascular;  Laterality: Right;   PERIPHERAL VASCULAR ATHERECTOMY Right 04/27/2017   Procedure: PERIPHERAL VASCULAR ATHERECTOMY;  Surgeon: Court Dorn PARAS, MD;  Location: [facility] INVASIVE CV LAB;  Service: Cardiovascular;  Laterality: Right;  Iliac   PERIPHERAL VASCULAR INTERVENTION Right 04/27/2017   Procedure: PERIPHERAL VASCULAR INTERVENTION;  Surgeon: Court Dorn PARAS, MD;  Location: [facility];  Service: Cardiovascular;  Laterality: Right;  Iliac    Current Medications: Current Meds  Medication Sig   atorvastatin  (LIPITOR ) 80 MG tablet Take 1 tablet Daily for Cholesterol (Patient taking differently: Take 80 mg by mouth daily. Take 1 tablet Daily for Cholesterol)   budesonide-glycopyrrolate-formoterol (BREZTRI  AEROSPHERE) 160-9-4.8 MCG/ACT AERO inhaler Inhale 2 puffs into the lungs in the morning and at bedtime.   Cholecalciferol  250 MCG (10000 UT) CAPS Take 10,000 Units by mouth every evening.   dofetilide  (TIKOSYN ) 500 MCG capsule TAKE 1 CAPSULE BY MOUTH 2 TIMES DAILY.   gabapentin  (NEURONTIN ) 300 MG capsule Take 1 capsule 3 x /day for Neuropathy Pain   ketoconazole  (NIZORAL ) 2 % cream Apply 1 Application topically daily as needed for irritation.   pyridOXINE (VITAMIN B-6) 100 MG tablet Take 100 mg by mouth every evening.   rivaroxaban  (XARELTO ) 20 MG TABS tablet Take 1 tablet (20 mg total) by mouth daily with supper.   triamcinolone  cream (KENALOG ) 0.5 % Apply 1 Application topically daily as needed (skin irritation).     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  Psychologist, educational Use   Vaping status: Never Used  Substance and Sexual Activity   Alcohol use: Yes    Alcohol/week: 2.0 standard drinks of alcohol    Types: 2 Standard drinks or equivalent per week    Comment: Social   Drug use: Yes    Frequency: 1.0 times per week    Types: Oxycodone    Sexual activity: Not Currently    Birth control/protection: None  Other Topics Concern   Not on file  Social History Narrative   Not on file   Social Drivers of Health   Financial Resource Strain: Medium Risk (12/29/2023)   Overall Financial Resource Strain (CARDIA)  Difficulty of Paying Living Expenses: Somewhat hard  Food Insecurity: Food Insecurity Present (12/29/2023)   Hunger Vital Sign    Worried About Running Out of Food in the Last Year: Sometimes true    Ran Out of Food in the Last Year: Sometimes true  Transportation Needs: No Transportation Needs (12/29/2023)   PRAPARE - Administrator, Civil Service (Medical): No    Lack of Transportation (Non-Medical): No  Physical Activity: Inactive (12/29/2023)   Exercise Vital Sign    Days of Exercise per Week: 0 days    Minutes of Exercise per Session: Not on file  Stress: Stress Concern Present (12/29/2023)   [employer] of Occupational Health - Occupational Stress Questionnaire    Feeling of Stress: Rather much  Social Connections: Socially Isolated (12/29/2023)   Social Connection and Isolation Panel    Frequency of Communication with Friends and Family: More than three times a week    Frequency of Social Gatherings with Friends and Family: Once a week    Attends Religious Services: Never    Loss adjuster, chartered or Organizations: No    Attends Engineer, structural: Not on file    Marital Status: Widowed     Family History: The patient's family history includes Cancer in his father and mother. There is no history of Colon cancer, Rectal cancer, Heart disease, Pancreatic cancer, Stomach cancer, or Esophageal cancer.  ROS:   Please see the history of present illness.     All other systems reviewed and are negative.  EKGs/Labs/Other Studies Reviewed:    The following studies were reviewed today:  EKG Interpretation Date/Time:  Friday January 22 2024 15:59:52 EDT Ventricular Rate:  74 PR Interval:  170 QRS Duration:  126 QT Interval:  430 QTC Calculation: 477 R Axis:   81  Text Interpretation: Sinus rhythm with Premature atrial complexes Right bundle branch block When compared with ECG of 29-Dec-2023 16:46, PREVIOUS ECG IS PRESENT Confirmed by Madie Slough ([id number]) on 01/22/2024 4:07:49 PM    Recent Labs: 07/06/2023: TSH 2.35 12/29/2023: ALT 16; BUN 13; Creatinine, Ser 0.78; Hemoglobin 10.6; Magnesium  2.1; Platelets 307; Potassium 3.7; Sodium 134  Recent Lipid Panel    Component Value Date/Time   CHOL 105 11/10/2023 0415   CHOL 132 08/10/2017 1545   TRIG 85 11/10/2023 0415   HDL 36 (L) 11/10/2023 0415   HDL 44 08/10/2017 1545   CHOLHDL 2.9 11/10/2023 0415   VLDL 17 11/10/2023 0415   LDLCALC 52 11/10/2023 0415   LDLCALC 90 07/06/2023 1433     Risk Assessment/Calculations:    CHA2DS2-VASc Score = 3   This indicates a 3.2% annual risk of stroke. The patient's score is based upon: CHF History: 0 HTN History: 1 Diabetes History: 0 Stroke History: 0 Vascular Disease History: 1 Age Score: 1 Gender Score: 0             Physical Exam:    VS:  BP 94/76   Pulse 75   Ht 6' 1 (1.854 m)   SpO2 96%   BMI 20.05 kg/m     Wt Readings from Last 3 Encounters:  01/21/24 152 lb (68.9 kg)  01/19/24 150 lb (68 kg)  01/12/24 148 lb (67.1 kg)     GEN:  Well nourished,  well developed in no acute distress HEENT: Normal NECK: No JVD; No carotid bruits LYMPHATICS: No lymphadenopathy CARDIAC: RRR, 3/6 systolic murmur RESPIRATORY:  rhonchi throughout, respirations unlabored  ABDOMEN: Soft, non-tender, non-distended  MUSCULOSKELETAL:  No edema; No deformity  SKIN: Warm and dry NEUROLOGIC:  Alert and oriented x 3 PSYCHIATRIC:  Normal affect   ASSESSMENT:    1. Essential hypertension   2. Paroxysmal atrial fibrillation (HCC)   3. Gross hematuria   4. Murmur, cardiac   5. Chronic anticoagulation   6. Elevated coronary artery calcium  score   7. Hyperlipidemia, mixed    PLAN:    In order of problems listed above:  Atrial flutter s/p ablation 2011 Paroxysmal atrial fibrillation Chronic anticoagulation - Maintained on Tikosyn  and Xarelto  - Patient self discontinued Xarelto  for hematuria - He has been referred to urology for repeat workup, prior workup has been negative -- has been back on xarelto , but intermittently so given hematuria   Hematuria - given need for xarelto , will refer back to urology - CBC stable in ER   PAD - Recent intervention with [facility] - Follows with Dr. Court with our practice - Continue Xarelto  -- no longer on ASA   Elevated coronary calcium  score - Reassuring nuclear stress test January 2025 - Will continue risk factor management, including recommendation for smoking cessation   Hyperlipidemia with LDL goal less than 55 - Consider lower LDL goal given significant smoking history   Labile hypertension - On no antihypertensive medications - Permitting him to run higher to avoid hypotension        Medication Adjustments/Labs and Tests Ordered: Current medicines are reviewed at length with the patient today.  Concerns regarding medicines are outlined above.  Orders Placed This Encounter  Procedures   Ambulatory referral to Urology   EKG 12-Lead   ECHOCARDIOGRAM COMPLETE   No orders of the defined types were  placed in this encounter.   Patient Instructions  Medication Instructions:  Continue to Xarelto  20mg .  Continue Tikosyn .  Continue Lipitor .  STOP aspirin  81mg   *If you need a refill on your cardiac medications before your next appointment, please call your pharmacy*   Lab Work: No labs were ordered during today's visit.  If you have labs (blood work) drawn today and your tests are completely normal, you will receive your results only by: MyChart Message (if you have MyChart) OR A paper copy in the mail If you have any lab test that is abnormal or we need to change your treatment, we will call you to review the results.   Testing/Procedures: Your physician has requested that you have an echocardiogram. Echocardiography is a painless test that uses sound waves to create images of your heart. It provides your doctor with information about the size and shape of your heart and how well your heart's chambers and valves are working. This procedure takes approximately one hour. There are no restrictions for this procedure.   Please do NOT wear cologne, perfume, aftershave, or lotions (deodorant is allowed).   Please arrive 15 minutes prior to your appointment time.  Please note: We ask at that you not bring children with you during ultrasound (echo/ vascular) testing. Due to room size and safety concerns, children are not allowed in the ultrasound rooms during exams. Our front office staff cannot provide observation of children in our lobby area while testing is being conducted. An adult accompanying a patient to their appointment will only be allowed in the ultrasound room at the discretion of the ultrasound technician under special circumstances. We apologize for any inconvenience.    Follow-Up: At [facility], you and your health needs are our priority.  As part of our continuing  mission to provide you with exceptional heart care, we have created designated Provider Care  Teams.  These Care Teams include your primary Cardiologist (physician) and Advanced Practice Providers (APPs -  Physician Assistants and Nurse Practitioners) who all work together to provide you with the care you need, when you need it.  We recommend signing up for the patient portal called MyChart.  Sign up information is provided on this After Visit Summary.  MyChart is used to connect with patients for Virtual Visits (Telemedicine).  Patients are able to view lab/test results, encounter notes, upcoming appointments, etc.  Non-urgent messages can be sent to your provider as well.   To learn more about what you can do with MyChart, go to [email or web address].    Your next appointment:   3 month(s)  Provider:   Dorn Lesches, MD    Other Instructions Thank you for choosing [facility]!       Signed, Jon Nat Hails, [place]  01/22/2024 5:09 PM    [facility]

## 2024-01-22 ENCOUNTER — Ambulatory Visit: Attending: Physician Assistant | Admitting: Physician Assistant

## 2024-01-22 ENCOUNTER — Encounter: Payer: Self-pay | Admitting: Physician Assistant

## 2024-01-22 VITALS — BP 94/76 | HR 75 | Ht 73.0 in

## 2024-01-22 DIAGNOSIS — E782 Mixed hyperlipidemia: Secondary | ICD-10-CM

## 2024-01-22 DIAGNOSIS — R931 Abnormal findings on diagnostic imaging of heart and coronary circulation: Secondary | ICD-10-CM | POA: Diagnosis not present

## 2024-01-22 DIAGNOSIS — I10 Essential (primary) hypertension: Secondary | ICD-10-CM | POA: Diagnosis not present

## 2024-01-22 DIAGNOSIS — R310 Gross hematuria: Secondary | ICD-10-CM

## 2024-01-22 DIAGNOSIS — Z7901 Long term (current) use of anticoagulants: Secondary | ICD-10-CM

## 2024-01-22 DIAGNOSIS — R011 Cardiac murmur, unspecified: Secondary | ICD-10-CM

## 2024-01-22 DIAGNOSIS — I480 Paroxysmal atrial fibrillation: Secondary | ICD-10-CM

## 2024-01-22 NOTE — Patient Instructions (Addendum)
 Medication Instructions:  Continue to Xarelto  20mg .  Continue Tikosyn .  Continue Lipitor .  STOP aspirin  81mg   *If you need a refill on your cardiac medications before your next appointment, please call your pharmacy*   Lab Work: No labs were ordered during today's visit.  If you have labs (blood work) drawn today and your tests are completely normal, you will receive your results only by: MyChart Message (if you have MyChart) OR A paper copy in the mail If you have any lab test that is abnormal or we need to change your treatment, we will call you to review the results.   Testing/Procedures: Your physician has requested that you have an echocardiogram. Echocardiography is a painless test that uses sound waves to create images of your heart. It provides your doctor with information about the size and shape of your heart and how well your heart's chambers and valves are working. This procedure takes approximately one hour. There are no restrictions for this procedure.   Please do NOT wear cologne, perfume, aftershave, or lotions (deodorant is allowed).   Please arrive 15 minutes prior to your appointment time.  Please note: We ask at that you not bring children with you during ultrasound (echo/ vascular) testing. Due to room size and safety concerns, children are not allowed in the ultrasound rooms during exams. Our front office staff cannot provide observation of children in our lobby area while testing is being conducted. An adult accompanying a patient to their appointment will only be allowed in the ultrasound room at the discretion of the ultrasound technician under special circumstances. We apologize for any inconvenience.    Follow-Up: At [facility], you and your health needs are our priority.  As part of our continuing mission to provide you with exceptional heart care, we have created designated Provider Care Teams.  These Care Teams include your primary  Cardiologist (physician) and Advanced Practice Providers (APPs -  Physician Assistants and Nurse Practitioners) who all work together to provide you with the care you need, when you need it.  We recommend signing up for the patient portal called MyChart.  Sign up information is provided on this After Visit Summary.  MyChart is used to connect with patients for Virtual Visits (Telemedicine).  Patients are able to view lab/test results, encounter notes, upcoming appointments, etc.  Non-urgent messages can be sent to your provider as well.   To learn more about what you can do with MyChart, go to [email or web address].    Your next appointment:   3 month(s)  Provider:   Dorn Lesches, MD    Other Instructions Thank you for choosing [facility]!

## 2024-01-25 ENCOUNTER — Encounter: Payer: Self-pay | Admitting: Pulmonary Disease

## 2024-01-29 ENCOUNTER — Ambulatory Visit (HOSPITAL_BASED_OUTPATIENT_CLINIC_OR_DEPARTMENT_OTHER)
Admission: RE | Admit: 2024-01-29 | Discharge: 2024-01-29 | Disposition: A | Discharge status: Home / Self Care | Source: Ambulatory Visit | Attending: Family Medicine | Admitting: Family Medicine

## 2024-01-29 DIAGNOSIS — J432 Centrilobular emphysema: Secondary | ICD-10-CM | POA: Diagnosis not present

## 2024-01-29 DIAGNOSIS — J90 Pleural effusion, not elsewhere classified: Secondary | ICD-10-CM | POA: Diagnosis not present

## 2024-01-29 DIAGNOSIS — J929 Pleural plaque without asbestos: Secondary | ICD-10-CM | POA: Diagnosis not present

## 2024-01-29 DIAGNOSIS — J181 Lobar pneumonia, unspecified organism: Secondary | ICD-10-CM | POA: Insufficient documentation

## 2024-01-29 DIAGNOSIS — J849 Interstitial pulmonary disease, unspecified: Secondary | ICD-10-CM | POA: Diagnosis not present

## 2024-01-29 DIAGNOSIS — R911 Solitary pulmonary nodule: Secondary | ICD-10-CM | POA: Diagnosis not present

## 2024-02-02 ENCOUNTER — Ambulatory Visit: Admitting: Cardiovascular Disease

## 2024-02-02 NOTE — Progress Notes (Signed)
 This encounter was created in error - please disregard.

## 2024-02-03 ENCOUNTER — Encounter (HOSPITAL_BASED_OUTPATIENT_CLINIC_OR_DEPARTMENT_OTHER): Payer: Self-pay

## 2024-02-03 ENCOUNTER — Inpatient Hospital Stay (HOSPITAL_BASED_OUTPATIENT_CLINIC_OR_DEPARTMENT_OTHER)
Admission: EM | Admit: 2024-02-03 | Discharge: 2024-02-29 | DRG: 853 | Disposition: E | Attending: Internal Medicine | Admitting: Internal Medicine

## 2024-02-03 ENCOUNTER — Emergency Department (HOSPITAL_BASED_OUTPATIENT_CLINIC_OR_DEPARTMENT_OTHER): Admitting: Radiology

## 2024-02-03 ENCOUNTER — Other Ambulatory Visit: Payer: Self-pay

## 2024-02-03 ENCOUNTER — Ambulatory Visit: Payer: Self-pay | Admitting: Family Medicine

## 2024-02-03 ENCOUNTER — Ambulatory Visit (HOSPITAL_BASED_OUTPATIENT_CLINIC_OR_DEPARTMENT_OTHER)

## 2024-02-03 ENCOUNTER — Encounter (HOSPITAL_BASED_OUTPATIENT_CLINIC_OR_DEPARTMENT_OTHER): Payer: Self-pay | Admitting: Emergency Medicine

## 2024-02-03 VITALS — BP 111/59 | HR 128

## 2024-02-03 DIAGNOSIS — J84112 Idiopathic pulmonary fibrosis: Secondary | ICD-10-CM | POA: Diagnosis not present

## 2024-02-03 DIAGNOSIS — R55 Syncope and collapse: Secondary | ICD-10-CM | POA: Diagnosis not present

## 2024-02-03 DIAGNOSIS — Z6821 Body mass index (BMI) 21.0-21.9, adult: Secondary | ICD-10-CM

## 2024-02-03 DIAGNOSIS — J8410 Pulmonary fibrosis, unspecified: Secondary | ICD-10-CM | POA: Diagnosis not present

## 2024-02-03 DIAGNOSIS — Z66 Do not resuscitate: Secondary | ICD-10-CM | POA: Diagnosis not present

## 2024-02-03 DIAGNOSIS — I4819 Other persistent atrial fibrillation: Secondary | ICD-10-CM

## 2024-02-03 DIAGNOSIS — J439 Emphysema, unspecified: Secondary | ICD-10-CM

## 2024-02-03 DIAGNOSIS — Z227 Latent tuberculosis: Secondary | ICD-10-CM | POA: Diagnosis not present

## 2024-02-03 DIAGNOSIS — E872 Acidosis, unspecified: Secondary | ICD-10-CM | POA: Diagnosis not present

## 2024-02-03 DIAGNOSIS — Z5948 Other specified lack of adequate food: Secondary | ICD-10-CM

## 2024-02-03 DIAGNOSIS — F319 Bipolar disorder, unspecified: Secondary | ICD-10-CM | POA: Diagnosis not present

## 2024-02-03 DIAGNOSIS — R64 Cachexia: Secondary | ICD-10-CM | POA: Diagnosis present

## 2024-02-03 DIAGNOSIS — D630 Anemia in neoplastic disease: Secondary | ICD-10-CM | POA: Diagnosis not present

## 2024-02-03 DIAGNOSIS — Z723 Lack of physical exercise: Secondary | ICD-10-CM

## 2024-02-03 DIAGNOSIS — E785 Hyperlipidemia, unspecified: Secondary | ICD-10-CM | POA: Diagnosis present

## 2024-02-03 DIAGNOSIS — Z5941 Food insecurity: Secondary | ICD-10-CM

## 2024-02-03 DIAGNOSIS — J188 Other pneumonia, unspecified organism: Secondary | ICD-10-CM | POA: Diagnosis not present

## 2024-02-03 DIAGNOSIS — Z9841 Cataract extraction status, right eye: Secondary | ICD-10-CM

## 2024-02-03 DIAGNOSIS — J984 Other disorders of lung: Secondary | ICD-10-CM | POA: Diagnosis not present

## 2024-02-03 DIAGNOSIS — R7989 Other specified abnormal findings of blood chemistry: Secondary | ICD-10-CM | POA: Diagnosis present

## 2024-02-03 DIAGNOSIS — E876 Hypokalemia: Secondary | ICD-10-CM | POA: Diagnosis present

## 2024-02-03 DIAGNOSIS — R443 Hallucinations, unspecified: Secondary | ICD-10-CM | POA: Diagnosis not present

## 2024-02-03 DIAGNOSIS — A419 Sepsis, unspecified organism: Secondary | ICD-10-CM | POA: Diagnosis not present

## 2024-02-03 DIAGNOSIS — C3412 Malignant neoplasm of upper lobe, left bronchus or lung: Secondary | ICD-10-CM | POA: Diagnosis present

## 2024-02-03 DIAGNOSIS — I7143 Infrarenal abdominal aortic aneurysm, without rupture: Secondary | ICD-10-CM | POA: Diagnosis not present

## 2024-02-03 DIAGNOSIS — J440 Chronic obstructive pulmonary disease with acute lower respiratory infection: Secondary | ICD-10-CM | POA: Diagnosis not present

## 2024-02-03 DIAGNOSIS — I739 Peripheral vascular disease, unspecified: Secondary | ICD-10-CM | POA: Diagnosis not present

## 2024-02-03 DIAGNOSIS — R6520 Severe sepsis without septic shock: Secondary | ICD-10-CM | POA: Diagnosis not present

## 2024-02-03 DIAGNOSIS — Z8615 Personal history of latent tuberculosis infection: Secondary | ICD-10-CM

## 2024-02-03 DIAGNOSIS — R54 Age-related physical debility: Secondary | ICD-10-CM | POA: Diagnosis present

## 2024-02-03 DIAGNOSIS — F419 Anxiety disorder, unspecified: Secondary | ICD-10-CM | POA: Diagnosis present

## 2024-02-03 DIAGNOSIS — I2510 Atherosclerotic heart disease of native coronary artery without angina pectoris: Secondary | ICD-10-CM | POA: Diagnosis not present

## 2024-02-03 DIAGNOSIS — N179 Acute kidney failure, unspecified: Secondary | ICD-10-CM | POA: Diagnosis not present

## 2024-02-03 DIAGNOSIS — J948 Other specified pleural conditions: Secondary | ICD-10-CM | POA: Diagnosis not present

## 2024-02-03 DIAGNOSIS — T501X5A Adverse effect of loop [high-ceiling] diuretics, initial encounter: Secondary | ICD-10-CM | POA: Diagnosis not present

## 2024-02-03 DIAGNOSIS — Z7901 Long term (current) use of anticoagulants: Secondary | ICD-10-CM

## 2024-02-03 DIAGNOSIS — Z7189 Other specified counseling: Secondary | ICD-10-CM | POA: Diagnosis not present

## 2024-02-03 DIAGNOSIS — J441 Chronic obstructive pulmonary disease with (acute) exacerbation: Secondary | ICD-10-CM | POA: Diagnosis not present

## 2024-02-03 DIAGNOSIS — R634 Abnormal weight loss: Secondary | ICD-10-CM | POA: Diagnosis not present

## 2024-02-03 DIAGNOSIS — R918 Other nonspecific abnormal finding of lung field: Secondary | ICD-10-CM | POA: Diagnosis present

## 2024-02-03 DIAGNOSIS — Z888 Allergy status to other drugs, medicaments and biological substances status: Secondary | ICD-10-CM

## 2024-02-03 DIAGNOSIS — I480 Paroxysmal atrial fibrillation: Secondary | ICD-10-CM | POA: Diagnosis not present

## 2024-02-03 DIAGNOSIS — Z801 Family history of malignant neoplasm of trachea, bronchus and lung: Secondary | ICD-10-CM

## 2024-02-03 DIAGNOSIS — Z733 Stress, not elsewhere classified: Secondary | ICD-10-CM

## 2024-02-03 DIAGNOSIS — R042 Hemoptysis: Secondary | ICD-10-CM | POA: Diagnosis not present

## 2024-02-03 DIAGNOSIS — F17210 Nicotine dependence, cigarettes, uncomplicated: Secondary | ICD-10-CM | POA: Diagnosis not present

## 2024-02-03 DIAGNOSIS — Z515 Encounter for palliative care: Secondary | ICD-10-CM | POA: Diagnosis not present

## 2024-02-03 DIAGNOSIS — R846 Abnormal cytological findings in specimens from respiratory organs and thorax: Secondary | ICD-10-CM | POA: Diagnosis not present

## 2024-02-03 DIAGNOSIS — J432 Centrilobular emphysema: Secondary | ICD-10-CM | POA: Diagnosis not present

## 2024-02-03 DIAGNOSIS — J189 Pneumonia, unspecified organism: Secondary | ICD-10-CM

## 2024-02-03 DIAGNOSIS — J849 Interstitial pulmonary disease, unspecified: Secondary | ICD-10-CM | POA: Diagnosis not present

## 2024-02-03 DIAGNOSIS — I10 Essential (primary) hypertension: Secondary | ICD-10-CM | POA: Diagnosis present

## 2024-02-03 DIAGNOSIS — I898 Other specified noninfective disorders of lymphatic vessels and lymph nodes: Secondary | ICD-10-CM | POA: Diagnosis not present

## 2024-02-03 DIAGNOSIS — Z604 Social exclusion and rejection: Secondary | ICD-10-CM | POA: Diagnosis present

## 2024-02-03 DIAGNOSIS — I452 Bifascicular block: Secondary | ICD-10-CM | POA: Diagnosis present

## 2024-02-03 DIAGNOSIS — J9601 Acute respiratory failure with hypoxia: Secondary | ICD-10-CM | POA: Diagnosis present

## 2024-02-03 DIAGNOSIS — J449 Chronic obstructive pulmonary disease, unspecified: Secondary | ICD-10-CM | POA: Diagnosis present

## 2024-02-03 DIAGNOSIS — Y92239 Unspecified place in hospital as the place of occurrence of the external cause: Secondary | ICD-10-CM | POA: Diagnosis not present

## 2024-02-03 DIAGNOSIS — Z87891 Personal history of nicotine dependence: Secondary | ICD-10-CM

## 2024-02-03 DIAGNOSIS — Z789 Other specified health status: Secondary | ICD-10-CM | POA: Diagnosis not present

## 2024-02-03 DIAGNOSIS — Z711 Person with feared health complaint in whom no diagnosis is made: Secondary | ICD-10-CM | POA: Diagnosis not present

## 2024-02-03 DIAGNOSIS — I4891 Unspecified atrial fibrillation: Secondary | ICD-10-CM | POA: Diagnosis present

## 2024-02-03 DIAGNOSIS — R0602 Shortness of breath: Secondary | ICD-10-CM | POA: Diagnosis not present

## 2024-02-03 DIAGNOSIS — F341 Dysthymic disorder: Secondary | ICD-10-CM | POA: Diagnosis present

## 2024-02-03 DIAGNOSIS — R008 Other abnormalities of heart beat: Secondary | ICD-10-CM | POA: Diagnosis not present

## 2024-02-03 DIAGNOSIS — R52 Pain, unspecified: Secondary | ICD-10-CM | POA: Diagnosis not present

## 2024-02-03 DIAGNOSIS — K5730 Diverticulosis of large intestine without perforation or abscess without bleeding: Secondary | ICD-10-CM | POA: Diagnosis not present

## 2024-02-03 DIAGNOSIS — Z48813 Encounter for surgical aftercare following surgery on the respiratory system: Secondary | ICD-10-CM | POA: Diagnosis not present

## 2024-02-03 DIAGNOSIS — R590 Localized enlarged lymph nodes: Secondary | ICD-10-CM | POA: Diagnosis present

## 2024-02-03 DIAGNOSIS — G629 Polyneuropathy, unspecified: Secondary | ICD-10-CM | POA: Diagnosis present

## 2024-02-03 DIAGNOSIS — J90 Pleural effusion, not elsewhere classified: Secondary | ICD-10-CM | POA: Diagnosis present

## 2024-02-03 DIAGNOSIS — J9811 Atelectasis: Secondary | ICD-10-CM | POA: Diagnosis not present

## 2024-02-03 DIAGNOSIS — D75839 Thrombocytosis, unspecified: Secondary | ICD-10-CM | POA: Diagnosis not present

## 2024-02-03 DIAGNOSIS — J410 Simple chronic bronchitis: Secondary | ICD-10-CM | POA: Diagnosis not present

## 2024-02-03 DIAGNOSIS — L816 Other disorders of diminished melanin formation: Secondary | ICD-10-CM | POA: Diagnosis present

## 2024-02-03 DIAGNOSIS — Z5986 Financial insecurity: Secondary | ICD-10-CM

## 2024-02-03 DIAGNOSIS — R410 Disorientation, unspecified: Secondary | ICD-10-CM | POA: Diagnosis not present

## 2024-02-03 DIAGNOSIS — R000 Tachycardia, unspecified: Secondary | ICD-10-CM | POA: Diagnosis not present

## 2024-02-03 DIAGNOSIS — R911 Solitary pulmonary nodule: Secondary | ICD-10-CM | POA: Diagnosis not present

## 2024-02-03 DIAGNOSIS — Z79899 Other long term (current) drug therapy: Secondary | ICD-10-CM

## 2024-02-03 DIAGNOSIS — J929 Pleural plaque without asbestos: Secondary | ICD-10-CM | POA: Diagnosis not present

## 2024-02-03 DIAGNOSIS — T40415A Adverse effect of fentanyl or fentanyl analogs, initial encounter: Secondary | ICD-10-CM | POA: Diagnosis not present

## 2024-02-03 DIAGNOSIS — J168 Pneumonia due to other specified infectious organisms: Secondary | ICD-10-CM | POA: Diagnosis not present

## 2024-02-03 DIAGNOSIS — C3492 Malignant neoplasm of unspecified part of left bronchus or lung: Secondary | ICD-10-CM | POA: Diagnosis not present

## 2024-02-03 LAB — TSH: TSH: 2.62 u[IU]/mL (ref 0.350–4.500)

## 2024-02-03 LAB — CBC WITH DIFFERENTIAL/PLATELET
Abs Immature Granulocytes: 0.22 K/uL — ABNORMAL HIGH (ref 0.00–0.07)
Basophils Absolute: 0.1 K/uL (ref 0.0–0.1)
Basophils Relative: 0 %
Eosinophils Absolute: 0.1 K/uL (ref 0.0–0.5)
Eosinophils Relative: 0 %
HCT: 35.7 % — ABNORMAL LOW (ref 39.0–52.0)
Hemoglobin: 11.3 g/dL — ABNORMAL LOW (ref 13.0–17.0)
Immature Granulocytes: 1 %
Lymphocytes Relative: 5 %
Lymphs Abs: 1 K/uL (ref 0.7–4.0)
MCH: 25.7 pg — ABNORMAL LOW (ref 26.0–34.0)
MCHC: 31.7 g/dL (ref 30.0–36.0)
MCV: 81.3 fL (ref 80.0–100.0)
Monocytes Absolute: 1.1 K/uL — ABNORMAL HIGH (ref 0.1–1.0)
Monocytes Relative: 5 %
Neutro Abs: 19.3 K/uL — ABNORMAL HIGH (ref 1.7–7.7)
Neutrophils Relative %: 89 %
Platelets: 354 K/uL (ref 150–400)
RBC: 4.39 MIL/uL (ref 4.22–5.81)
RDW: 17.7 % — ABNORMAL HIGH (ref 11.5–15.5)
WBC: 21.7 K/uL — ABNORMAL HIGH (ref 4.0–10.5)
nRBC: 0 % (ref 0.0–0.2)

## 2024-02-03 LAB — CBC
HCT: 32.2 % — ABNORMAL LOW (ref 39.0–52.0)
Hemoglobin: 9.9 g/dL — ABNORMAL LOW (ref 13.0–17.0)
MCH: 25.1 pg — ABNORMAL LOW (ref 26.0–34.0)
MCHC: 30.7 g/dL (ref 30.0–36.0)
MCV: 81.5 fL (ref 80.0–100.0)
Platelets: 304 K/uL (ref 150–400)
RBC: 3.95 MIL/uL — ABNORMAL LOW (ref 4.22–5.81)
RDW: 17.3 % — ABNORMAL HIGH (ref 11.5–15.5)
WBC: 19.6 K/uL — ABNORMAL HIGH (ref 4.0–10.5)
nRBC: 0 % (ref 0.0–0.2)

## 2024-02-03 LAB — COMPREHENSIVE METABOLIC PANEL WITH GFR
ALT: 12 U/L (ref 0–44)
AST: 22 U/L (ref 15–41)
Albumin: 3.7 g/dL (ref 3.5–5.0)
Alkaline Phosphatase: 116 U/L (ref 38–126)
Anion gap: 13 (ref 5–15)
BUN: 35 mg/dL — ABNORMAL HIGH (ref 8–23)
CO2: 29 mmol/L (ref 22–32)
Calcium: >15 mg/dL (ref 8.9–10.3)
Chloride: 98 mmol/L (ref 98–111)
Creatinine, Ser: 1.15 mg/dL (ref 0.61–1.24)
GFR, Estimated: >60 mL/min (ref >60)
Glucose, Bld: 169 mg/dL — ABNORMAL HIGH (ref 70–99)
Potassium: 3.7 mmol/L (ref 3.5–5.1)
Sodium: 140 mmol/L (ref 135–145)
Total Bilirubin: 0.6 mg/dL (ref 0.0–1.2)
Total Protein: 7.4 g/dL (ref 6.5–8.1)

## 2024-02-03 LAB — SARS CORONAVIRUS 2 BY RT PCR: SARS Coronavirus 2 by RT PCR: NEGATIVE

## 2024-02-03 LAB — CREATININE, SERUM
Creatinine, Ser: 1 mg/dL (ref 0.61–1.24)
GFR, Estimated: >60 mL/min (ref >60)

## 2024-02-03 LAB — LIPASE, BLOOD: Lipase: 12 U/L (ref 11–51)

## 2024-02-03 LAB — HIV ANTIBODY (ROUTINE TESTING W REFLEX): HIV Screen 4th Generation wRfx: NONREACTIVE

## 2024-02-03 LAB — LACTIC ACID, PLASMA
Lactic Acid, Venous: 3.2 mmol/L (ref 0.5–1.9)
Lactic Acid, Venous: 1.7 mmol/L (ref 0.5–1.9)

## 2024-02-03 LAB — PHOSPHORUS: Phosphorus: 2.5 mg/dL (ref 2.5–4.6)

## 2024-02-03 LAB — TROPONIN T, HIGH SENSITIVITY: Troponin T High Sensitivity: 22 ng/L — ABNORMAL HIGH (ref <19)

## 2024-02-03 LAB — TROPONIN I (HIGH SENSITIVITY): Troponin I (High Sensitivity): 22 ng/L — ABNORMAL HIGH (ref <18)

## 2024-02-03 MED ORDER — TRIAMCINOLONE ACETONIDE 0.5 % EX CREA
1.0000 | TOPICAL_CREAM | Freq: Every day | CUTANEOUS | Status: DC | PRN
  Filled 2024-02-03: qty 15

## 2024-02-03 MED ORDER — SODIUM CHLORIDE 0.9 % IV SOLN: Freq: Once | INTRAVENOUS | Status: DC

## 2024-02-03 MED ORDER — ATORVASTATIN CALCIUM 80 MG PO TABS
80.0000 mg | ORAL_TABLET | Freq: Every day | ORAL | Status: DC
  Filled 2024-02-03: qty 1

## 2024-02-03 MED ORDER — ALBUTEROL SULFATE (2.5 MG/3ML) 0.083% IN NEBU
2.5000 mg | INHALATION_SOLUTION | Freq: Four times a day (QID) | RESPIRATORY_TRACT | Status: DC
  Administered 2024-02-03: 2.5 mg via RESPIRATORY_TRACT
  Filled 2024-02-03 (×2): qty 3

## 2024-02-03 MED ORDER — KETOCONAZOLE 2 % EX CREA
1.0000 | TOPICAL_CREAM | Freq: Every day | CUTANEOUS | Status: DC | PRN
  Administered 2024-02-28: 1 via TOPICAL
  Filled 2024-02-03: qty 15

## 2024-02-03 MED ORDER — HYDROCODONE-ACETAMINOPHEN 5-325 MG PO TABS
1.0000 | ORAL_TABLET | ORAL | Status: DC | PRN
  Administered 2024-02-03 – 2024-02-15 (×41): 1 via ORAL
  Filled 2024-02-03 (×34): qty 1

## 2024-02-03 MED ORDER — SODIUM CHLORIDE 0.9 % IV SOLN: 2.0000 g | Freq: Once | INTRAVENOUS | Status: DC

## 2024-02-03 MED ORDER — ENOXAPARIN SODIUM 40 MG/0.4ML IJ SOSY
40.0000 mg | PREFILLED_SYRINGE | INTRAMUSCULAR | Status: DC
  Administered 2024-02-03: 40 mg via SUBCUTANEOUS
  Filled 2024-02-03: qty 0.4

## 2024-02-03 MED ORDER — SODIUM CHLORIDE 0.9 % IV SOLN
100.0000 mg | Freq: Two times a day (BID) | INTRAVENOUS | Status: DC
  Administered 2024-02-04 – 2024-02-05 (×2): 100 mg via INTRAVENOUS
  Filled 2024-02-03 (×3): qty 100

## 2024-02-03 MED ORDER — SODIUM CHLORIDE 0.9 % IV SOLN: 500.0000 mg | INTRAVENOUS | Status: DC

## 2024-02-03 MED ORDER — BUDESON-GLYCOPYRROL-FORMOTEROL 160-9-4.8 MCG/ACT IN AERO
2.0000 | INHALATION_SPRAY | Freq: Two times a day (BID) | RESPIRATORY_TRACT | Status: DC
  Administered 2024-02-03 – 2024-02-28 (×39): 2 via RESPIRATORY_TRACT
  Filled 2024-02-03: qty 5.9

## 2024-02-03 MED ORDER — VITAMIN D 25 MCG (1000 UNIT) PO TABS
10000.0000 [IU] | ORAL_TABLET | Freq: Every evening | ORAL | Status: DC
  Administered 2024-02-03 – 2024-02-10 (×11): 10000 [IU] via ORAL
  Administered 2024-02-12: 5000 [IU] via ORAL
  Administered 2024-02-14 – 2024-02-15 (×2): 10000 [IU] via ORAL
  Filled 2024-02-03 (×17): qty 10

## 2024-02-03 MED ORDER — LACTATED RINGERS IV BOLUS (SEPSIS): 250.0000 mL | Freq: Once | INTRAVENOUS | Status: DC

## 2024-02-03 MED ORDER — VITAMIN B-6 100 MG PO TABS
100.0000 mg | ORAL_TABLET | Freq: Every evening | ORAL | Status: DC
  Administered 2024-02-03 – 2024-02-28 (×29): 100 mg via ORAL
  Filled 2024-02-03 (×26): qty 1

## 2024-02-03 MED ORDER — SODIUM CHLORIDE 0.9 % IV BOLUS
500.0000 mL | Freq: Once | INTRAVENOUS | Status: AC
  Administered 2024-02-03: 500 mL via INTRAVENOUS

## 2024-02-03 MED ORDER — ALBUTEROL SULFATE (2.5 MG/3ML) 0.083% IN NEBU
2.5000 mg | INHALATION_SOLUTION | Freq: Four times a day (QID) | RESPIRATORY_TRACT | Status: DC | PRN
  Administered 2024-02-15 – 2024-02-18 (×2): 2.5 mg via RESPIRATORY_TRACT
  Filled 2024-02-03 (×2): qty 3

## 2024-02-03 MED ORDER — LACTATED RINGERS IV BOLUS (SEPSIS)
1000.0000 mL | Freq: Once | INTRAVENOUS | Status: AC
  Administered 2024-02-03: 1000 mL via INTRAVENOUS

## 2024-02-03 MED ORDER — RIVAROXABAN 20 MG PO TABS: 20.0000 mg | ORAL_TABLET | Freq: Every day | ORAL | Status: DC

## 2024-02-03 MED ORDER — SODIUM CHLORIDE 0.9 % IV BOLUS
1000.0000 mL | Freq: Once | INTRAVENOUS | Status: AC
  Administered 2024-02-03: 1000 mL via INTRAVENOUS

## 2024-02-03 MED ORDER — LACTATED RINGERS IV SOLN
INTRAVENOUS | Status: DC
Start: 2024-02-03 — End: 2024-02-03

## 2024-02-03 MED ORDER — SODIUM CHLORIDE 0.9 % IV SOLN: 500.0000 mg | Freq: Once | INTRAVENOUS | Status: DC

## 2024-02-03 MED ORDER — GABAPENTIN 300 MG PO CAPS
300.0000 mg | ORAL_CAPSULE | Freq: Three times a day (TID) | ORAL | Status: DC
  Administered 2024-02-03 – 2024-02-28 (×84): 300 mg via ORAL
  Filled 2024-02-03 (×75): qty 1

## 2024-02-03 MED ORDER — SODIUM CHLORIDE 0.9 % IV SOLN
1.0000 g | Freq: Once | INTRAVENOUS | Status: AC
  Administered 2024-02-03: 1 g via INTRAVENOUS
  Filled 2024-02-03: qty 10

## 2024-02-03 MED ORDER — SODIUM CHLORIDE 0.9 % IV SOLN
1.0000 g | INTRAVENOUS | Status: DC
  Administered 2024-02-04 – 2024-02-07 (×4): 1 g via INTRAVENOUS
  Filled 2024-02-03 (×4): qty 10

## 2024-02-03 MED ORDER — LACTATED RINGERS IV BOLUS (SEPSIS): 1000.0000 mL | Freq: Once | INTRAVENOUS | Status: DC

## 2024-02-03 MED ORDER — SODIUM CHLORIDE 0.9 % IV SOLN
500.0000 mg | Freq: Once | INTRAVENOUS | Status: AC
  Administered 2024-02-03: 500 mg via INTRAVENOUS
  Filled 2024-02-03: qty 5

## 2024-02-03 MED ORDER — DOFETILIDE 250 MCG PO CAPS
500.0000 ug | ORAL_CAPSULE | Freq: Two times a day (BID) | ORAL | Status: DC
  Administered 2024-02-03 – 2024-02-28 (×56): 500 ug via ORAL
  Filled 2024-02-03 (×52): qty 2

## 2024-02-03 MED ORDER — SODIUM CHLORIDE 0.9 % IV SOLN
INTRAVENOUS | Status: DC
Start: 2024-02-03 — End: 2024-02-20

## 2024-02-03 NOTE — H&P (Signed)
 History and Physical    Patient: Bradley Ferguson FMW:[id number] DOB: [birth year] DOA: 02/03/2024 DOS: the patient was seen and examined on 02/03/2024 PCP: Sebastian Beverley NOVAK, MD  Patient coming from: Home  Chief Complaint:  Chief Complaint  Patient presents with   Pneumonia   HPI: Bradley Ferguson is a 73 y.o. male with medical history significant of patient with history of COPD, interstitial lung disease, patient has latent tuberculosis, atrial fibrillation on Tikosyn  and Xarelto , peripheral arterial disease, essential hypertension, anxiety disorder depression, history of hematuria with lung consolidation was on antibiotics by pulmonary.  Patient went to see pulmonary today due to near syncope and productive cough.  He was found to have significant dyspnea and tachycardia with heart rate in the 120s.  Patient noted to have oxygen saturation 90% on room air.  He was however noted to have hypercalcemia, leukocytosis with left shift and lactic acidosis.  EKG showed atrial fibrillation with a rate of 128 right bundle branch block on LPFB.  Patient was noted to have a masslike consolidation in the left upper lobe that is worrisome for primary bronchogenic carcinoma.  There is also a partially loculated small left pleural effusion.  Patient was started on IV fluids, pulmonary consulted and transferred for possible AFB.  Review of Systems: As mentioned in the history of present illness. All other systems reviewed and are negative. Past Medical History:  Diagnosis Date   Anxiety    Atrial fibrillation (HCC)    Blood transfusion without reported diagnosis    Cataract    Depression    Emphysema of lung (HCC)    Heart murmur    Hyperlipidemia    Hypertension    PVD (peripheral vascular disease) (HCC)    Raynaud's syndrome    Substance abuse (HCC)    Tobacco   Systemic sclerosis (HCC)    TB lung, latent 12/19/2019   s/p INH x 3 months   Transaminitis 06/18/2020   Past Surgical History:  Procedure  Laterality Date   ABDOMINAL AORTOGRAM W/LOWER EXTREMITY Left 07/09/2023   Procedure: ABDOMINAL AORTOGRAM W/LOWER EXTREMITY;  Surgeon: Court Dorn PARAS, MD;  Location: [facility];  Service: Cardiovascular;  Laterality: Left;   ATRIAL FIBRILLATION ABLATION     CATARACT EXTRACTION Right    COLONOSCOPY     ENDARTERECTOMY FEMORAL Left 09/02/2023   Procedure: LEFT LOWER EXTREMITY ANGIOGRAM WITH LEFT ILIAC  STENTING;  Surgeon: Gretta Lonni PARAS, MD;  Location: [facility] OR;  Service: Vascular;  Laterality: Left;   ENDARTERECTOMY FEMORAL Right 11/09/2023   Procedure: ENDARTERECTOMY, FEMORAL TO PROFUNDA;  Surgeon: Gretta Lonni PARAS, MD;  Location: [facility] OR;  Service: Vascular;  Laterality: Right;   EYE SURGERY     FEMORAL-POPLITEAL BYPASS GRAFT Left 09/02/2023   Procedure: LEFT FEMORAL ENDARTARECTOMY WITH PATCH ANGIOPLASTY;  Surgeon: Gretta Lonni PARAS, MD;  Location: [facility] OR;  Service: Vascular;  Laterality: Left;   FRACTURE SURGERY Left 1976   HERNIA REPAIR  2023   INGUINAL HERNIA REPAIR Right 03/23/2020   Procedure: OPEN RIGHT INGUINAL HERNIA REPAIR WITH MESH;  Surgeon: Gladis Cough, MD;  Location: [facility] OR;  Service: General;  Laterality: Right;   INSERTION OF MESH Right 03/23/2020   Procedure: INSERTION OF MESH;  Surgeon: Gladis Cough, MD;  Location: [facility] OR;  Service: General;  Laterality: Right;   LOWER EXTREMITY INTERVENTION N/A 04/27/2017   Procedure: LOWER EXTREMITY INTERVENTION;  Surgeon: Court Dorn PARAS, MD;  Location: [facility];  Service: Cardiovascular;  Laterality: N/A;  PATCH ANGIOPLASTY Right 11/09/2023   Procedure: ANGIOPLASTY, USING PATCH GRAFT, USING XENOSURE 1X14CM;  Surgeon: Gretta Lonni PARAS, MD;  Location: [facility] OR;  Service: Vascular;  Laterality: Right;   PERIPHERAL VASCULAR ATHERECTOMY Right 04/27/2017   Procedure: PERIPHERAL VASCULAR ATHERECTOMY;  Surgeon: Court Dorn PARAS, MD;  Location: [facility] INVASIVE CV LAB;  Service: Cardiovascular;  Laterality: Right;   Iliac   PERIPHERAL VASCULAR INTERVENTION Right 04/27/2017   Procedure: PERIPHERAL VASCULAR INTERVENTION;  Surgeon: Court Dorn PARAS, MD;  Location: [facility];  Service: Cardiovascular;  Laterality: Right;  Iliac   Social History:  reports that he has been smoking cigarettes. He started smoking about 54 years ago. He has a 108.9 pack-year smoking history. He has never used smokeless tobacco. He reports current alcohol use of about 2.0 standard drinks of alcohol per week. He reports current drug use. Frequency: 1.00 time per week. Drug: Oxycodone .  Allergies  Allergen Reactions   Cymbalta  [Duloxetine  Hcl]    Effexor [Venlafaxine]     Feels bad   Wellbutrin  [Bupropion ]     sleepy   Zoloft [Sertraline Hcl]     Felt bad    Family History  Problem Relation Age of Onset   Cancer Mother        small cell lung cancer   Cancer Father        unsure type   Colon cancer Neg Hx    Rectal cancer Neg Hx    Heart disease Neg Hx    Pancreatic cancer Neg Hx    Stomach cancer Neg Hx    Esophageal cancer Neg Hx     Prior to Admission medications   Medication Sig Start Date End Date Taking? Authorizing Provider  albuterol  (PROVENTIL ) (2.5 MG/3ML) 0.083% nebulizer solution Take 3 mLs (2.5 mg total) by nebulization every 6 (six) hours as needed for wheezing or shortness of breath. 01/12/24   Sebastian Beverley NOVAK, MD  albuterol  (VENTOLIN  HFA) 108 [phone] Base) MCG/ACT inhaler Use  2 inhalations  15 minutes apart  every 4 hours  to rescue Asthma Attack 05/20/21   Tonita Fallow, MD  atorvastatin  (LIPITOR ) 80 MG tablet Take 1 tablet Daily for Cholesterol Patient taking differently: Take 80 mg by mouth daily. Take 1 tablet Daily for Cholesterol 08/13/23   Webb, Padonda B, FNP  budesonide -glycopyrrolate -formoterol  (BREZTRI  AEROSPHERE) 160-9-4.8 MCG/ACT AERO inhaler Inhale 2 puffs into the lungs in the morning and at bedtime. 01/21/24   Tamea Dedra CROME, MD  Cholecalciferol  250 MCG (10000 UT) CAPS Take  10,000 Units by mouth every evening.    [provider]  dofetilide  (TIKOSYN ) 500 MCG capsule TAKE 1 CAPSULE BY MOUTH 2 TIMES DAILY. 08/17/23   Court Dorn PARAS, MD  gabapentin  (NEURONTIN ) 300 MG capsule Take 1 capsule 3 x /day for Neuropathy Pain 08/14/23   Webb, Padonda B, FNP  ketoconazole  (NIZORAL ) 2 % cream Apply 1 Application topically daily as needed for irritation.    [provider]  pyridOXINE  (VITAMIN B-6) 100 MG tablet Take 100 mg by mouth every evening.    [provider]  rivaroxaban  (XARELTO ) 20 MG TABS tablet Take 1 tablet (20 mg total) by mouth daily with supper. 12/01/23   Schuh, McKenzi P, PA-C  triamcinolone  cream (KENALOG ) 0.5 % Apply 1 Application topically daily as needed (skin irritation).    [provider]    Physical Exam: Vitals:   02/03/24 1800 02/03/24 1815 02/03/24 1830 02/03/24 1936  BP: 109/81 107/74 (!) 122/97 (!) 113/92  Pulse:  87 94 (!) 57   Resp: (!) 23 (!) 24 (!) 25   Temp:    (!) 97.3 F (36.3 C)  TempSrc:    Oral  SpO2: 97% 98% 92%    Constitutional: Chronically ill looking NAD, calm, comfortable Eyes: PERRL, lids and conjunctivae normal ENMT: Mucous membranes are moist. Posterior pharynx clear of any exudate or lesions.Normal dentition.  Neck: normal, supple, no masses, no thyromegaly Respiratory: Decreased air entry bilaterally with some rhonchi. No accessory muscle use.  Cardiovascular: Regular rate and rhythm, no murmurs / rubs / gallops. No extremity edema. 2+ pedal pulses. No carotid bruits.  Abdomen: no tenderness, no masses palpated. No hepatosplenomegaly. Bowel sounds positive.  Musculoskeletal: Good range of motion, no joint swelling or tenderness, Skin: no rashes, lesions, ulcers. No induration Neurologic: CN 2-12 grossly intact. Sensation intact, DTR normal. Strength 5/5 in all 4.  Psychiatric: Normal judgment and insight. Alert and oriented x 3. Normal mood  Data Reviewed:  Temperature 97.5, blood  pressure 145/102, pulse 128, respirate 25, white count 12.7, hemoglobin 11.3, lactic acid 3.2, calcium  more than 15 glucose 169 BUN 35 acute viral screen is negative chest x-ray showed worsening cavitary masslike consolidation in the left upper lobe worrisome for primary bronchogenic carcinoma or pneumonia.  Also partially located small left pleural effusion and interstitial lung disease  Assessment and Plan:  #1 left upper lobe cavitary mass: Appears to be secondary to cavitary pneumonia versus bronchogenic carcinoma.  Patient has been admitted to isolation.  Patient will be getting AFB cultures and QuantiFERON ordered.  Patient also to be treated for community-acquired pneumonia.  Breathing treatments and steroids.  Pulmonary consulted and will follow their recommendations.  #2 hypercalcemia: Aggressively hydrate.  Follow calcium  level.  This is worrisome for possible carcinoma.  Continue workup as above.  #3 atrial fibrillation: Patient has some mild rapid response.  On Xarelto .  Patient will be taken off Xarelto  and use Lovenox  instead just in case bronchoscopy is being planned.  Continue with rate control.  #4 COPD: Continue breathing treatments.  Consider steroid.  #5 essential hypertension: Continue blood pressure control.  #6 severe sepsis: Secondary to pneumonia.  Continue antibiotics.  Lactic acid elevated.  Will hydrate and monitor  #7 peripheral arterial disease: Stable.  #8 latent tuberculosis: Will continue with PTV cultures as well as following with the QuantiFERON.  #9 bipolar 1 disorder: Continue home regimen.    Advance Care Planning:   Code Status: Full Code   Consults: Pulmonary  Family Communication: No family at bedside  Severity of Illness: The appropriate patient status for this patient is INPATIENT. Inpatient status is judged to be reasonable and necessary in order to provide the required intensity of service to ensure the patient's safety. The patient's  presenting symptoms, physical exam findings, and initial radiographic and laboratory data in the context of their chronic comorbidities is felt to place them at high risk for further clinical deterioration. Furthermore, it is not anticipated that the patient will be medically stable for discharge from the hospital within 2 midnights of admission.   * I certify that at the point of admission it is my clinical judgment that the patient will require inpatient hospital care spanning beyond 2 midnights from the point of admission due to high intensity of service, high risk for further deterioration and high frequency of surveillance required.*  AuthorBETHA SIM KNOLL, MD 02/03/2024 7:45 PM  For on call review [email or web address].

## 2024-02-03 NOTE — ED Notes (Signed)
 [facility] at bedside to transport pt to [facility]

## 2024-02-03 NOTE — ED Notes (Signed)
 Called [facility] to transport patient to [facility] rm# [id number]

## 2024-02-03 NOTE — Plan of Care (Signed)
 Transfer intake note 73 year old M with PMH of COPD/ILD/Latin TB, A-fib on Tikosyn  and Xarelto , PAD, HTN, anxiety, depression, hematuria and lung consolidation for which he was on antibiotic for weeks sent to [facility] from pulmonary office due to near syncope, productive cough, dyspnea and tachycardia to 120s.   In ED, stable vitals except for mild tachycardia to 100s.  Saturating in upper 90s on RA.  Per EDP, no significant respiratory distress.  Calcium  > 15.  Albumin  3.7.  WBC 21.7 with left shift.  Lactic acid 3.2.  Otherwise CMP and CBC without significant finding.  COVID-19, influenza and RSV PCR nonreactive.  EKG A-fib at 120 bpm with RBBB and LPFB.  CXR concerning for worsening cavitary masslike consolidation in LUL worrisome for primary bronchogenic carcinoma, partially loculated small left pleural effusion and ILD.  Blood cultures obtained.  Received LR and NS fluid per sepsis protocol and started on continuous LR.  Also received ceftriaxone  and Zithromax .  Admission requested for hypercalcemia, concern for sepsis and bronchogenic pneumonia.  Ionized calcium , lipase, TSH, PTH and phosphorus pending.  Requested EDP to change fluid to NS instead of LR given hypercalcemia.  I have added repeat BMP.   Placed bed request order for progressive care with negative pressure room since pulmonology suggested AFB cultures.  Please notify pulmonologist on arrival.

## 2024-02-03 NOTE — Progress Notes (Signed)
 @Patient  ID: Alm VEAR Bellman, male    DOB: 1951/01/28, 73 y.o.   MRN: [id number]  Chief Complaint  Patient presents with   Follow-up    Referring provider: Sebastian Beverley NOVAK, MD  HPI: Mr. Rabon Scholle. Depuy is a 73 y/o male with PMH of COPD, Afib, PAD, latent TB, ILD who presents today for follow up of Chest CT completed on 01/29/2024.  He was seen in the emergency department on December 29, 2023 for paroxysmal atrial fibrillation.  During the course of his evaluation he was found to have multifocal lung consolidation on chest CT.He was seen in follow-up on 01/21/2024 and it was recommended that he get a repeat chest CT to document resolution.  This was completed on 01/29/2024 in anticipation of his appointment today.  Today he reports that he had a near syncopal episode when he was leaving to come to his appointment today.  He reports that he has had dizziness and lightheadedness and has been overall been feeling very poorly.  He continues to have a productive cough sometimes this includes blood.  He also reports that he has to pant to breathe and feels quite short of breath most of the time.  Vitals reviewed today in office demonstrate tachycardia with a heart rate of 128.  He required a wheelchair to be brought in which is not his usual mode of ambulation.  He denies chest pain, fevers, chills, abdominal pain.  TEST/EVENTS :   Allergies  Allergen Reactions   Cymbalta  [Duloxetine  Hcl]    Effexor [Venlafaxine]     Feels bad   Wellbutrin  [Bupropion ]     sleepy   Zoloft [Sertraline Hcl]     Felt bad    Immunization History  Administered Date(s) Administered   Influenza Whole 04/30/2010   Influenza, High Dose Seasonal PF 03/20/2017, 03/29/2018, 04/06/2019, 04/30/2020, 05/08/2021, 03/31/2023   Moderna SARS-COV2 Booster Vaccination 05/12/2020   Moderna Sars-Covid-2 Vaccination 05/12/2020   PFIZER Comirnaty(Gray Top)Covid-19 Tri-Sucrose Vaccine 03/20/2022   PFIZER(Purple Top)SARS-COV-2 Vaccination  08/04/2019, 08/29/2019   Pfizer Covid-19 Vaccine Bivalent Booster 69yrs & up 05/11/2021   Pneumococcal Conjugate-13 02/05/2017   Pneumococcal Polysaccharide-23 04/30/2010, 03/29/2018   Td 06/30/2013   Zoster Recombinant(Shingrix) 02/09/2023    Past Medical History:  Diagnosis Date   Anxiety    Atrial fibrillation (HCC)    Blood transfusion without reported diagnosis    Cataract    Depression    Emphysema of lung (HCC)    Heart murmur    Hyperlipidemia    Hypertension    PVD (peripheral vascular disease) (HCC)    Raynaud's syndrome    Substance abuse (HCC)    Tobacco   Systemic sclerosis (HCC)    TB lung, latent 12/19/2019   s/p INH x 3 months   Transaminitis 06/18/2020    Tobacco History: Social History   Tobacco Use  Smoking Status Every Day   Current packs/day: 0.50   Average packs/day: 1.1 packs/day for 95.4 years (108.9 ttl pk-yrs)   Types: Cigarettes   Start date: 1971  Smokeless Tobacco Never  Tobacco Comments   Smokes 2 cigarettes daily- khj 01/21/2024      Smoked 1 PPD at his heaviest.   Ready to quit: Not Answered Counseling given: Not Answered Tobacco comments: Smokes 2 cigarettes daily- khj 01/21/2024  Smoked 1 PPD at his heaviest.   Outpatient Medications Prior to Visit  Medication Sig Dispense Refill   albuterol  (PROVENTIL ) (2.5 MG/3ML) 0.083% nebulizer solution Take 3 mLs (2.5 mg total)  by nebulization every 6 (six) hours as needed for wheezing or shortness of breath. 150 mL 1   albuterol  (VENTOLIN  HFA) 108 (90 Base) MCG/ACT inhaler Use  2 inhalations  15 minutes apart  every 4 hours  to rescue Asthma Attack 48 g 3   atorvastatin  (LIPITOR ) 80 MG tablet Take 1 tablet Daily for Cholesterol (Patient taking differently: Take 80 mg by mouth daily. Take 1 tablet Daily for Cholesterol) 90 tablet 0   budesonide -glycopyrrolate -formoterol  (BREZTRI  AEROSPHERE) 160-9-4.8 MCG/ACT AERO inhaler Inhale 2 puffs into the lungs in the morning and at bedtime. 3 each 0    Cholecalciferol  250 MCG (10000 UT) CAPS Take 10,000 Units by mouth every evening.     dofetilide  (TIKOSYN ) 500 MCG capsule TAKE 1 CAPSULE BY MOUTH 2 TIMES DAILY. 180 capsule 3   gabapentin  (NEURONTIN ) 300 MG capsule Take 1 capsule 3 x /day for Neuropathy Pain 270 capsule 3   ketoconazole  (NIZORAL ) 2 % cream Apply 1 Application topically daily as needed for irritation.     pyridOXINE  (VITAMIN B-6) 100 MG tablet Take 100 mg by mouth every evening.     rivaroxaban  (XARELTO ) 20 MG TABS tablet Take 1 tablet (20 mg total) by mouth daily with supper. 30 tablet 6   triamcinolone  cream (KENALOG ) 0.5 % Apply 1 Application topically daily as needed (skin irritation).     oxyCODONE  (OXY IR/ROXICODONE ) 5 MG immediate release tablet Take 1 tablet (5 mg total) by mouth every 6 (six) hours as needed for severe pain (pain score 7-10). (Patient not taking: Reported on 01/22/2024) 30 tablet 0   predniSONE  (DELTASONE ) 50 MG tablet Take 1 tablet daily for 5 days. (Patient not taking: Reported on 01/22/2024) 5 tablet 0   No facility-administered medications prior to visit.     Review of Systems:   Constitutional:   No  weight loss, ,  Fevers, chills,  or  lassitude.  Positive for fatigue and night sweats.  HEENT:   No headaches,  Difficulty swallowing,  Tooth/dental problems, or  Sore throat,                No sneezing, itching, ear ache, nasal congestion, post nasal drip,   CV:  No chest pain,  Orthopnea, PND, swelling in lower extremities, anasarca, positive for dizziness, lightheadedness, and near syncope. GI  No heartburn, indigestion, abdominal pain, nausea, vomiting, diarrhea, change in bowel habits, loss of appetite, bloody stools.   Resp: .  No excess mucus  No change in color of mucus.  No wheezing.  No chest wall deformity.  Positive for productive cough with hemoptysis.  Positive for shortness of breath with exertion and at rest  Skin: no rash or lesions.  GU: no dysuria, change in color of urine,  no urgency or frequency.  No flank pain, no hematuria   MS:  No joint pain or swelling.  No decreased range of motion.  No back pain.    Physical Exam  BP (!) 111/59 (BP Location: Right Arm, Patient Position: Sitting, Cuff Size: Normal)   Pulse (!) 128   SpO2 97%   GEN: Awake, appears tired.  Pale and cachectic    HEENT:  [place]/AT,  EACs-clear, TMs-wnl, NOSE-clear, THROAT-clear, no lesions, no postnasal drip or exudate noted.  Oral mucosa pale  NECK:  Supple w/ fair ROM; no JVD; normal carotid impulses w/o bruits; no thyromegaly or nodules palpated; no lymphadenopathy.    RESP diminished breath sounds throughout w/o, wheezes/ rales/ or rhonchi. no accessory muscle use,  no dullness to percussion  CARD: Irregularly irregular, no m/r/g, no peripheral edema, pulses intact, no cyanosis or clubbing.  GI:   Soft & nt; nml bowel sounds; no organomegaly or masses detected.   Musco: Warm bil, no deformities or joint swelling noted.   Neuro: alert, no focal deficits noted.    Skin: Warm, no lesions or rashes    Lab Results:  CBC    Component Value Date/Time   WBC 10.1 12/29/2023 1659   RBC 4.03 (L) 12/29/2023 1659   HGB 10.6 (L) 12/29/2023 1659   HGB 14.4 06/16/2023 0927   HCT 33.5 (L) 12/29/2023 1659   HCT 44.2 06/16/2023 0927   PLT 307 12/29/2023 1659   PLT 180 06/16/2023 0927   MCV 83.1 12/29/2023 1659   MCV 93 06/16/2023 0927   MCH 26.3 12/29/2023 1659   MCHC 31.6 12/29/2023 1659   RDW 14.4 12/29/2023 1659   RDW 13.4 06/16/2023 0927   LYMPHSABS 1.0 12/29/2023 1659   LYMPHSABS 2.2 06/16/2023 0927   MONOABS 0.8 12/29/2023 1659   EOSABS 0.1 12/29/2023 1659   EOSABS 0.4 06/16/2023 0927   BASOSABS 0.1 12/29/2023 1659   BASOSABS 0.1 06/16/2023 0927    BMET    Component Value Date/Time   NA 134 (L) 12/29/2023 1659   NA 140 11/02/2023 1409   K 3.7 12/29/2023 1659   CL 101 12/29/2023 1659   CO2 23 12/29/2023 1659   GLUCOSE 125 (H) 12/29/2023 1659   BUN 13 12/29/2023  1659   BUN 13 11/02/2023 1409   CREATININE 0.78 12/29/2023 1659   CREATININE 0.97 07/06/2023 1433   CALCIUM  8.9 12/29/2023 1659   GFRNONAA >60 12/29/2023 1659   GFRNONAA 70 11/12/2020 0000   GFRAA 82 11/12/2020 0000    BNP No results found for: BNP  ProBNP No results found for: PROBNP  Imaging: CT CHEST WO CONTRAST Result Date: 02/03/2024 CLINICAL DATA:  Pneumonia, complications suspected, x-ray done. * Tracking Code: BO * EXAM: CT CHEST WITHOUT CONTRAST TECHNIQUE: Multidetector CT imaging of the chest was performed following the standard protocol without IV contrast. RADIATION DOSE REDUCTION: This exam was performed according to the departmental dose-optimization program which includes automated exposure control, adjustment of the mA and/or kV according to patient size and/or use of iterative reconstruction technique. COMPARISON:  CT December 29, 2023 and June 04, 2021 FINDINGS: Cardiovascular: Aortic atherosclerosis. Three-vessel coronary artery calcifications/stents. Calcifications of the aortic valve and annulus. Calcifications of the mitral annulus. Normal size heart. No significant pericardial effusion/thickening. Mediastinum/Nodes: No suspicious thyroid  nodule. Prominent mediastinal lymph nodes are similar prior for instance an AP window lymph node measuring 9 mm in short axis on image 56/301 is unchanged. The esophagus is grossly unremarkable. Lungs/Pleura: Persistent dense consolidation with air bronchograms in the left upper lobe measuring 7.5 x 5.6 cm on image 46/302 previously 7.0 x 5.6 cm. Increased size of the adjacent pleural effusion/pleural thickening. Additional consolidative nodularity in the left lower lobe has increased from prior for instance measuring 17 x 10 mm on image 73/302 previously 14 x 7 mm. Similar findings of basilar predominant interstitial lung disease. Upper Abdomen: No acute abnormality. Musculoskeletal: No aggressive lytic or blastic lesion of bone. Multilevel  degenerative changes spine. Diffuse demineralization of bone. IMPRESSION: 1. Slight increase in size of the dense mass like consolidation with air bronchograms in the left upper lobe with increased adjacent pleural effusion/pleural thickening and increased size of additional consolidative nodularity in the left lower lobe. Findings are nonspecific and may  reflect an infectious/inflammatory process with possible treatment failure, however underlying malignancy is a pertinent differential consideration warranting further evaluation. Suggest pulmonary consultation with consideration for direct tissue sampling and/or nuclear medicine PET-CT. 2. Prominent mediastinal lymph nodes are nonspecific but similar prior suggest continued attention on follow-up imaging. 3. Similar findings of basilar predominant interstitial lung disease. 4. Aortic atherosclerosis. Aortic Atherosclerosis (ICD10-I70.0). Electronically Signed   By: Reyes Holder M.D.   On: 02/03/2024 15:06   VAS US  LOWER EXTREMITY ARTERIAL DUPLEX Result Date: 01/19/2024 LOWER EXTREMITY ARTERIAL DUPLEX STUDY Patient Name:  JASMAN MURRI  Date of Exam:   01/19/2024 Medical Rec #: [id number]       Accession #:    [id number] Date of Birth: 02-28-1951       Patient Gender: M Patient Age:   63 years Exam Location:  [address] Procedure:      VAS US  LOWER EXTREMITY ARTERIAL DUPLEX Referring Phys: [facility] --------------------------------------------------------------------------------  Indications: Peripheral artery disease. High Risk Factors: Hypertension, hyperlipidemia, current smoker.  Vascular Interventions: Successful diamondback orbital rotation                         arthrectomy, PT                         and covered stent at a subtotal calcified proximal                         right                         common iliac artery on 04/27/2017. Angioplasty and                         stent                         of left common and external iliac  artery, and left                         common femoral endarterectomy including                         profundoplasty                         and endarterectomy the proximal SFA with bovine                         pericardial patch angioplasty on 09/02/2023. Current ABI:            0.5/0.54 Comparison Study: 04/09/17 Performing Technologist: Garnette Rockers  Examination Guidelines: A complete evaluation includes B-mode imaging, spectral Doppler, color Doppler, and power Doppler as needed of all accessible portions of each vessel. Bilateral testing is considered an integral part of a complete examination. Limited examinations for reoccurring indications may be performed as noted.  +-----------+--------+-----+---------------+----------+-----------------------+ RIGHT      PSV cm/sRatioStenosis       Waveform  Comments                +-----------+--------+-----+---------------+----------+-----------------------+ CFA Distal 27                          biphasic                          +-----------+--------+-----+---------------+----------+-----------------------+  DFA        64           50-74% stenosismonophasic                        +-----------+--------+-----+---------------+----------+-----------------------+ SFA Prox                occluded                                         +-----------+--------+-----+---------------+----------+-----------------------+ SFA Mid                 occluded                                         +-----------+--------+-----+---------------+----------+-----------------------+ SFA Distal              occluded                                         +-----------+--------+-----+---------------+----------+-----------------------+ POP Prox   19                          monophasicReconstitution from DFA +-----------+--------+-----+---------------+----------+-----------------------+ POP Distal 27                          monophasic                         +-----------+--------+-----+---------------+----------+-----------------------+ ATA Distal 19                          monophasic                        +-----------+--------+-----+---------------+----------+-----------------------+ PTA Distal 11                          monophasic                        +-----------+--------+-----+---------------+----------+-----------------------+ PERO Distal19                          monophasic                        +-----------+--------+-----+---------------+----------+-----------------------+ DFA Mid - 90 cm/s - monophasic DFA Dst - 51 cm/s - monophasic.  Summary: Right: 50-74% stenosis noted in the deep femoral artery. Total occlusion noted in the superficial femoral artery.  See table(s) above for measurements and observations. Electronically signed by Lonni Gaskins MD on 01/19/2024 at 12:31:40 PM.    Final    VAS US  ABI WITH/WO TBI Result Date: 01/19/2024  LOWER EXTREMITY DOPPLER STUDY Patient Name:  JUANJESUS PEPPERMAN  Date of Exam:   01/19/2024 Medical Rec #: [id number]       Accession #:    [id number] Date of Birth: 06/03/1951       Patient Gender: M Patient Age:   22 years Exam Location:  [address] Procedure:      VAS US   ABI WITH/WO TBI Referring Phys: [facility] --------------------------------------------------------------------------------  Indications: Peripheral artery disease. High Risk Factors: Hypertension, hyperlipidemia, current smoker.  Vascular Interventions: Successful diamondback orbital rotation                         arthrectomy, PT                         and covered stent at a subtotal calcified proximal                         right                         common iliac artery on 04/27/2017. Angioplasty and                         stent                         of left common and external iliac artery, and left                         common femoral endarterectomy including                          profundoplasty                         and endarterectomy the proximal SFA with bovine                         pericardial patch angioplasty on 09/02/2023. Comparison Study: 10/13/23 Performing Technologist: Garnette Rockers  Examination Guidelines: A complete evaluation includes at minimum, Doppler waveform signals and systolic blood pressure reading at the level of bilateral brachial, anterior tibial, and posterior tibial arteries, when vessel segments are accessible. Bilateral testing is considered an integral part of a complete examination. Photoelectric Plethysmograph (PPG) waveforms and toe systolic pressure readings are included as required and additional duplex testing as needed. Limited examinations for reoccurring indications may be performed as noted.  ABI Findings: +---------+------------------+-----+-------------------+--------+ Right    Rt Pressure (mmHg)IndexWaveform           Comment  +---------+------------------+-----+-------------------+--------+ Brachial 139                                                +---------+------------------+-----+-------------------+--------+ PTA      53                0.38 dampened monophasic         +---------+------------------+-----+-------------------+--------+ DP       70                0.50 dampened monophasic         +---------+------------------+-----+-------------------+--------+ Great Toe0                 0.00 Absent                      +---------+------------------+-----+-------------------+--------+ +---------+------------------+-----+----------+-------+ Left     Lt Pressure (mmHg)IndexWaveform  Comment +---------+------------------+-----+----------+-------+ Brachial 139                                      +---------+------------------+-----+----------+-------+  PTA      75                0.54 monophasic        +---------+------------------+-----+----------+-------+ DP       66                0.47 monophasic         +---------+------------------+-----+----------+-------+ Great Toe34                0.24 Abnormal          +---------+------------------+-----+----------+-------+ +-------+-----------+-----------+------------+------------+ ABI/TBIToday's ABIToday's TBIPrevious ABIPrevious TBI +-------+-----------+-----------+------------+------------+ Right  0.5        0.0        0.28        0.0          +-------+-----------+-----------+------------+------------+ Left   0.54       0.24       0.64        0.3          +-------+-----------+-----------+------------+------------+  Summary: Right: Resting right ankle-brachial index indicates moderate right lower extremity arterial disease. The right toe-brachial index is abnormal. Left: Resting left ankle-brachial index indicates moderate left lower extremity arterial disease. The left toe-brachial index is abnormal. *See table(s) above for measurements and observations.  Electronically signed by Lonni Gaskins MD on 01/19/2024 at 11:19:29 AM.    Final     ipratropium-albuterol  (DUONEB) 0.5-2.5 (3) MG/3ML nebulizer solution 3 mL     Date Action Dose Route User   01/21/2024 1639 Given 3 mL Nebulization Vicci Evalene DEL, CMA          Latest Ref Rng & Units 02/29/2020   11:08 AM 08/29/2019   11:57 AM  PFT Results  FVC-Pre L 4.23  4.44   FVC-Predicted Pre % 81  88   FVC-Post L  4.40   FVC-Predicted Post %  87   Pre FEV1/FVC % % 72  71   Post FEV1/FCV % %  75   FEV1-Pre L 3.04  3.17   FEV1-Predicted Pre % 78  85   FEV1-Post L  3.28   DLCO uncorrected ml/min/mmHg 20.04  20.37   DLCO UNC% % 67  70   DLCO corrected ml/min/mmHg 20.04  21.19   DLCO COR %Predicted % 67  73   DLVA Predicted % 83  86   TLC L  5.96   TLC % Predicted %  78   RV % Predicted %  64     No results found for: NITRICOXIDE   Assessment & Plan:  Mr. Jamaar Howes. [place] is a 73 y/o male with PMH of COPD, Afib, PAD, latent TB, ILD who presents today for follow up  of Chest CT completed on 01/29/2024.  Given his near syncopal episode, tachycardia, dizziness, lightheadedness, and unresolved pneumonia, recommend transfer to the emergency department for further evaluation and treatment.  Case discussed with charge nurse and they are aware that he will be brought down. Assessment & Plan Community acquired pneumonia of left lung, unspecified part of lung - This has not resolved.  Suspect this is either atypical infection or postobstructive. -Patient will need bronchoscopy for further evaluation. Cough with hemoptysis - minor - Recommend AFB cultures as well given history of latent TB. Combined pulmonary fibrosis and emphysema (CPFE) (HCC) - Noted. Persistent atrial fibrillation (HCC) - Ongoing.   No follow-ups on file.  Candis Dandy, PA-C 02/03/2024

## 2024-02-03 NOTE — ED Provider Notes (Addendum)
 [place] EMERGENCY DEPARTMENT AT [facility] Provider Note   CSN: [id number] Arrival date & time: 02/03/24  1529     Patient presents with: Pneumonia   Bradley Ferguson is a 73 y.o. male.   Patient sent down from pulmonary medicine upstairs.  Patient has a history of COPD atrial fibs peripheral artery disease latent TB ILD who presents today for follow-up chest CT that was done on August 1.  That showed worsening findings.  Patient originally evaluated in early July for multifocal pneumonia.  Had CT angio chest at that time.  Did show abnormalities did raise some question about pulmonary mass at that time.  Still could be pulmonary mass.  Also does have a history of latent tuberculosis.  Patient has been a heavy smoker.  But did quit on July 2.  Has not had recent admission to the hospital  Past medical history also significant for Raynaud's syndrome hypertension hyperlipidemia.  In 2018 patient had a lot of lower extremity peripheral vascular processes done including arthrectomy.  Most recently had endarterectomy femoral on the left in March 2025.  Patient has been getting weaker.  In today trying to get to the pulmonary appointment almost had a syncopal episode.  He did not pass out.  But he had to be moved with a wheelchair.  Patient last on antibiotics probably about 3 weeks ago.       Prior to Admission medications   Medication Sig Start Date End Date Taking? Authorizing Provider  albuterol  (PROVENTIL ) (2.5 MG/3ML) 0.083% nebulizer solution Take 3 mLs (2.5 mg total) by nebulization every 6 (six) hours as needed for wheezing or shortness of breath. 01/12/24   Sebastian Beverley NOVAK, MD  albuterol  (VENTOLIN  HFA) [phone] Base) MCG/ACT inhaler Use  2 inhalations  15 minutes apart  every 4 hours  to rescue Asthma Attack 05/20/21   Tonita Fallow, MD  atorvastatin  (LIPITOR ) 80 MG tablet Take 1 tablet Daily for Cholesterol Patient taking differently: Take 80 mg by mouth daily. Take 1  tablet Daily for Cholesterol 08/13/23   Webb, Padonda B, FNP  budesonide -glycopyrrolate -formoterol  (BREZTRI  AEROSPHERE) 160-9-4.8 MCG/ACT AERO inhaler Inhale 2 puffs into the lungs in the morning and at bedtime. 01/21/24   Tamea Dedra CROME, MD  Cholecalciferol  250 MCG (10000 UT) CAPS Take 10,000 Units by mouth every evening.    [provider]  dofetilide  (TIKOSYN ) 500 MCG capsule TAKE 1 CAPSULE BY MOUTH 2 TIMES DAILY. 08/17/23   Court Dorn PARAS, MD  gabapentin  (NEURONTIN ) 300 MG capsule Take 1 capsule 3 x /day for Neuropathy Pain 08/14/23   Webb, Padonda B, FNP  ketoconazole  (NIZORAL ) 2 % cream Apply 1 Application topically daily as needed for irritation.    [provider]  pyridOXINE  (VITAMIN B-6) 100 MG tablet Take 100 mg by mouth every evening.    [provider]  rivaroxaban  (XARELTO ) 20 MG TABS tablet Take 1 tablet (20 mg total) by mouth daily with supper. 12/01/23   Schuh, McKenzi P, PA-C  triamcinolone  cream (KENALOG ) 0.5 % Apply 1 Application topically daily as needed (skin irritation).    [provider]    Allergies: Cymbalta  [duloxetine  hcl], Effexor [venlafaxine], Wellbutrin  [bupropion ], and Zoloft [sertraline hcl]    Review of Systems  Constitutional:  Positive for fatigue. Negative for chills and fever.  HENT:  Negative for ear pain and sore throat.   Eyes:  Negative for pain and visual disturbance.  Respiratory:  Positive for shortness of breath. Negative for cough.  Cardiovascular:  Negative for chest pain and palpitations.  Gastrointestinal:  Negative for abdominal pain and vomiting.  Genitourinary:  Negative for dysuria and hematuria.  Musculoskeletal:  Negative for arthralgias and back pain.  Skin:  Negative for color change and rash.  Neurological:  Positive for weakness and light-headedness. Negative for seizures and syncope.  All other systems reviewed and are negative.   Updated Vital Signs BP 124/84   Pulse 100   Temp (!) 97.5  F (36.4 C) (Oral)   Resp 18   SpO2 98%   Physical Exam Vitals and nursing note reviewed.  Constitutional:      General: He is not in acute distress.    Appearance: Normal appearance. He is well-developed. He is ill-appearing.  HENT:     Head: Normocephalic and atraumatic.     Mouth/Throat:     Mouth: Mucous membranes are moist.  Eyes:     Extraocular Movements: Extraocular movements intact.     Conjunctiva/sclera: Conjunctivae normal.     Pupils: Pupils are equal, round, and reactive to light.  Cardiovascular:     Rate and Rhythm: Normal rate and regular rhythm.     Heart sounds: No murmur heard. Pulmonary:     Effort: Pulmonary effort is normal. No respiratory distress.     Breath sounds: Normal breath sounds. No wheezing, rhonchi or rales.  Abdominal:     Palpations: Abdomen is soft.     Tenderness: There is no abdominal tenderness.  Musculoskeletal:        General: No swelling.     Cervical back: Normal range of motion and neck supple.     Right lower leg: No edema.     Left lower leg: No edema.  Skin:    General: Skin is warm and dry.     Capillary Refill: Capillary refill takes less than 2 seconds.  Neurological:     General: No focal deficit present.     Mental Status: He is alert and oriented to person, place, and time.  Psychiatric:        Mood and Affect: Mood normal.     (all labs ordered are listed, but only abnormal results are displayed) Labs Reviewed  CBC WITH DIFFERENTIAL/PLATELET - Abnormal; Notable for the following components:      Result Value   WBC 21.7 (*)    Hemoglobin 11.3 (*)    HCT 35.7 (*)    MCH 25.7 (*)    RDW 17.7 (*)    Neutro Abs 19.3 (*)    Monocytes Absolute 1.1 (*)    Abs Immature Granulocytes 0.22 (*)    All other components within normal limits  COMPREHENSIVE METABOLIC PANEL WITH GFR - Abnormal; Notable for the following components:   Glucose, Bld 169 (*)    BUN 35 (*)    Calcium  >15.0 (*)    All other components within  normal limits  LACTIC ACID, PLASMA - Abnormal; Notable for the following components:   Lactic Acid, Venous 3.2 (*)    All other components within normal limits  SARS CORONAVIRUS 2 BY RT PCR  CULTURE, BLOOD (ROUTINE X 2)  CULTURE, BLOOD (ROUTINE X 2)  LACTIC ACID, PLASMA  LIPASE, BLOOD  TSH  PARATHYROID  HORMONE, INTACT (NO CA)  PHOSPHORUS  CALCIUM , IONIZED    EKG: EKG Interpretation Date/Time:  Wednesday February 03 2024 15:38:20 EDT Ventricular Rate:  120 PR Interval:    QRS Duration:  141 QT Interval:  357 QTC Calculation: 505 R Axis:  102  Text Interpretation: Atrial fibrillation RBBB and LPFB No significant change since last tracing Confirmed by Juma Oxley [id number]) on 02/03/2024 3:46:41 PM  Radiology: [facility] Chest 2 View Result Date: 02/03/2024 CLINICAL DATA:  Shortness of breath.  Tachycardia. EXAM: CHEST - 2 VIEW COMPARISON:  12/29/2023 and CT chest 01/29/2024. FINDINGS: The trachea is midline. Heart size stable. Cavitary masslike consolidation in the left upper lobe has progressed from 12/29/2023. Associated pleural thickening and pleural fluid in the lateral and lower left hemithorax. Mild coarsening of the peripheral pulmonary markings. IMPRESSION: 1. Worsening cavitary masslike consolidation in the left upper lobe, worrisome for primary bronchogenic carcinoma. Pneumonia is not excluded. 2. Partially loculated small left pleural effusion and/or thickening. 3. Interstitial lung disease, better seen on 01/29/2024. Electronically Signed   By: Newell Eke M.D.   On: 02/03/2024 16:32     Procedures   Medications Ordered in the ED  azithromycin  (ZITHROMAX ) 500 mg in sodium chloride  0.9 % 250 mL IVPB (500 mg Intravenous New Bag/Given 02/03/24 1701)  lactated ringers  infusion (has no administration in time range)  lactated ringers  bolus 1,000 mL (1,000 mLs Intravenous New Bag/Given 02/03/24 1650)    And  lactated ringers  bolus 1,000 mL (has no administration in time range)     And  lactated ringers  bolus 250 mL (has no administration in time range)  sodium chloride  0.9 % bolus 500 mL (500 mLs Intravenous New Bag/Given 02/03/24 1600)  cefTRIAXone  (ROCEPHIN ) 1 g in sodium chloride  0.9 % 100 mL IVPB (0 g Intravenous Stopped 02/03/24 1656)                                    Medical Decision Making Amount and/or Complexity of Data Reviewed Labs: ordered.  Risk Prescription drug management. Decision regarding hospitalization.   CT scan from August 1 shows  CBC white count 21.7.  Hemoglobin 11.3 platelets 354.  Will get blood cultures will get lactic acid.  Patient's vital signs of the middle little tachycardic but is not showing evidence of sepsis at this time.  But will be starting on antibiotics for presumed pneumonia.  Complete metabolic panel still pending.  Respiratory panel pending.  Ultimately patient will need readmission.  The CT scan done on July 1 in August versus raising a lot of concerns for either persistent pneumonia or pulmonary mass.  Leaning towards pulmonary mass with secondary pneumonia.  Patient's lactic acid at 3.2 that along with the elevated white count will activate sepsis protocol.  Patient without fever.  But temp is down a little low.  White count is elevated 21,000.  Blood pressure still good.  CT scan from August 1 showed increase in size of the dense masslike consolidation left upper lobe with increased adjacent pleural effusion pleural thickening and increased size of additional consolidative nodularity in the left lower lobe.  Findings are nonspecific may reflect infectious inflammatory process with possible treatment failure however underlying malignancy is pertinent in the differential.  In addition there is prominent mediastinal lymph nodes.  CRITICAL CARE Performed by: Illana Nolting Total critical care time: 45 minutes Critical care time was exclusive of separately billable procedures and treating other patients. Critical care  was necessary to treat or prevent imminent or life-threatening deterioration. Critical care was time spent personally by me on the following activities: development of treatment plan with patient and/or surrogate as well as nursing, discussions with consultants, evaluation of patient's response  to treatment, examination of patient, obtaining history from patient or surrogate, ordering and performing treatments and interventions, ordering and review of laboratory studies, ordering and review of radiographic studies, pulse oximetry and re-evaluation of patient's condition.   COVID test negative.  Complete metabolic panel calcium  greater than 15 critical value.  Fluids may help with that.  Liver function test normal glucose 169.  Electrolytes normal GFR greater than 60.    I think since patient is pretty much asymptomatic with this we will go with just the fluids that were already giving for sepsis at this point in time.  Does raise a lot of concerns for maybe cancer or metastatic disease.  Based on patient's initial EKG had right bundle branch block and left bundle branch block.  So hard to determine whether he really has any short QT's because he is got atrial fibs.  If anything his QT seem to be prolonged.  Is hard to tell with a prolonged PR intervals and QRS.  Hospitalist will admit.  The raised an interesting point.  Will make sure all fluids are switched over to normal saline.  Since lactated Ringer 's does have some calcium  in it.     Final diagnoses:  Sepsis, due to unspecified organism, unspecified whether acute organ dysfunction present [facility])  Mass of left lung  Multifocal pneumonia  Hypercalcemia    ED Discharge Orders     None          Geraldene Hamilton, MD 02/03/24 1649    Geraldene Hamilton, MD 02/03/24 1657    Geraldene Hamilton, MD 02/03/24 1726    Geraldene Hamilton, MD 02/03/24 1739

## 2024-02-03 NOTE — ED Notes (Signed)
 AC changed the bed to 3E rm# 18

## 2024-02-03 NOTE — Sepsis Progress Note (Signed)
 ELink is following this Code Sepsis.

## 2024-02-03 NOTE — Assessment & Plan Note (Signed)
 Ongoing

## 2024-02-03 NOTE — ED Triage Notes (Signed)
 Pulm pt,  probable pna, tachy Sob, labored, worsening for a while  Pain in feet  Some pain in right chest Fall getting out of car this morning

## 2024-02-04 DIAGNOSIS — R918 Other nonspecific abnormal finding of lung field: Secondary | ICD-10-CM

## 2024-02-04 DIAGNOSIS — Z227 Latent tuberculosis: Secondary | ICD-10-CM

## 2024-02-04 DIAGNOSIS — J984 Other disorders of lung: Secondary | ICD-10-CM

## 2024-02-04 DIAGNOSIS — A419 Sepsis, unspecified organism: Secondary | ICD-10-CM | POA: Diagnosis not present

## 2024-02-04 DIAGNOSIS — R634 Abnormal weight loss: Secondary | ICD-10-CM | POA: Diagnosis not present

## 2024-02-04 DIAGNOSIS — I480 Paroxysmal atrial fibrillation: Secondary | ICD-10-CM

## 2024-02-04 DIAGNOSIS — J849 Interstitial pulmonary disease, unspecified: Secondary | ICD-10-CM

## 2024-02-04 LAB — COMPREHENSIVE METABOLIC PANEL WITH GFR
ALT: 12 U/L (ref 0–44)
AST: 19 U/L (ref 15–41)
Albumin: 2.2 g/dL — ABNORMAL LOW (ref 3.5–5.0)
Alkaline Phosphatase: 70 U/L (ref 38–126)
Anion gap: 8 (ref 5–15)
BUN: 28 mg/dL — ABNORMAL HIGH (ref 8–23)
CO2: 27 mmol/L (ref 22–32)
Calcium: 12.4 mg/dL — ABNORMAL HIGH (ref 8.9–10.3)
Chloride: 104 mmol/L (ref 98–111)
Creatinine, Ser: 0.91 mg/dL (ref 0.61–1.24)
GFR, Estimated: >60 mL/min (ref >60)
Glucose, Bld: 124 mg/dL — ABNORMAL HIGH (ref 70–99)
Potassium: 3.2 mmol/L — ABNORMAL LOW (ref 3.5–5.1)
Sodium: 139 mmol/L (ref 135–145)
Total Bilirubin: 0.5 mg/dL (ref 0.0–1.2)
Total Protein: 5.4 g/dL — ABNORMAL LOW (ref 6.5–8.1)

## 2024-02-04 LAB — CBC
HCT: 26.8 % — ABNORMAL LOW (ref 39.0–52.0)
Hemoglobin: 8.7 g/dL — ABNORMAL LOW (ref 13.0–17.0)
MCH: 26.3 pg (ref 26.0–34.0)
MCHC: 32.5 g/dL (ref 30.0–36.0)
MCV: 81 fL (ref 80.0–100.0)
Platelets: 284 K/uL (ref 150–400)
RBC: 3.31 MIL/uL — ABNORMAL LOW (ref 4.22–5.81)
RDW: 17.2 % — ABNORMAL HIGH (ref 11.5–15.5)
WBC: 18 K/uL — ABNORMAL HIGH (ref 4.0–10.5)
nRBC: 0 % (ref 0.0–0.2)

## 2024-02-04 LAB — BASIC METABOLIC PANEL WITH GFR
Anion gap: 8 (ref 5–15)
BUN: 32 mg/dL — ABNORMAL HIGH (ref 8–23)
CO2: 30 mmol/L (ref 22–32)
Calcium: 13.3 mg/dL (ref 8.9–10.3)
Chloride: 99 mmol/L (ref 98–111)
Creatinine, Ser: 0.91 mg/dL (ref 0.61–1.24)
GFR, Estimated: >60 mL/min (ref >60)
Glucose, Bld: 127 mg/dL — ABNORMAL HIGH (ref 70–99)
Potassium: 3.7 mmol/L (ref 3.5–5.1)
Sodium: 137 mmol/L (ref 135–145)

## 2024-02-04 LAB — APTT: aPTT: 42 s — ABNORMAL HIGH (ref 24–36)

## 2024-02-04 LAB — EXPECTORATED SPUTUM ASSESSMENT W GRAM STAIN, RFLX TO RESP C

## 2024-02-04 LAB — HEPARIN LEVEL (UNFRACTIONATED): Heparin Unfractionated: 0.1 [IU]/mL — ABNORMAL LOW (ref 0.30–0.70)

## 2024-02-04 LAB — TROPONIN I (HIGH SENSITIVITY): Troponin I (High Sensitivity): 19 ng/L — ABNORMAL HIGH (ref <18)

## 2024-02-04 MED ORDER — SODIUM CHLORIDE 3 % IN NEBU
4.0000 mL | INHALATION_SOLUTION | RESPIRATORY_TRACT | Status: AC | PRN
  Administered 2024-02-04 (×2): 4 mL via RESPIRATORY_TRACT
  Filled 2024-02-04 (×4): qty 4

## 2024-02-04 MED ORDER — HEPARIN (PORCINE) 25000 UT/250ML-% IV SOLN
1600.0000 [IU]/h | INTRAVENOUS | Status: DC
  Administered 2024-02-04: 950 [IU]/h via INTRAVENOUS
  Administered 2024-02-05 – 2024-02-06 (×2): 1300 [IU]/h via INTRAVENOUS
  Administered 2024-02-07: 1400 [IU]/h via INTRAVENOUS
  Administered 2024-02-08: 1650 [IU]/h via INTRAVENOUS
  Administered 2024-02-08 (×2): 1550 [IU]/h via INTRAVENOUS
  Administered 2024-02-08: 1650 [IU]/h via INTRAVENOUS
  Administered 2024-02-09 – 2024-02-10 (×4): 1600 [IU]/h via INTRAVENOUS
  Filled 2024-02-04 (×9): qty 250

## 2024-02-04 MED ORDER — POLYETHYLENE GLYCOL 3350 17 G PO PACK
17.0000 g | PACK | Freq: Every day | ORAL | Status: DC
  Administered 2024-02-04 – 2024-02-28 (×18): 17 g via ORAL
  Filled 2024-02-04 (×23): qty 1

## 2024-02-04 MED ORDER — BISACODYL 5 MG PO TBEC
5.0000 mg | DELAYED_RELEASE_TABLET | Freq: Every day | ORAL | Status: DC | PRN
  Administered 2024-02-06 – 2024-02-28 (×4): 5 mg via ORAL
  Filled 2024-02-04 (×5): qty 1

## 2024-02-04 MED ORDER — POTASSIUM CHLORIDE CRYS ER 20 MEQ PO TBCR
40.0000 meq | EXTENDED_RELEASE_TABLET | Freq: Once | ORAL | Status: AC
  Administered 2024-02-04: 40 meq via ORAL
  Filled 2024-02-04: qty 2

## 2024-02-04 NOTE — Consult Note (Signed)
 NAME:  Bradley Ferguson, MRN:  [id number], DOB:  02-18-1951, LOS: 1 ADMISSION DATE:  02/03/2024, CONSULTATION DATE:  02/04/24 REFERRING MD:  Dr Arlon, CHIEF COMPLAINT:  Hemoptysis    History of Present Illness:  73 year old male with a past medical history of limited systemic sclerosis, COPD, ILD, A-fib, latent TB who is presenting to the hospital for further evaluation of syncopal episodes associated with tachycardia, dizziness, lightheadedness and unresolved pneumonia.  Per chart review, the patient was following with rheumatology in 2021 for his rheumatologic disease and was recommended to start Imuran however his QuantiFERON was positive and he was referred to ID for initiation of INH but he was lost to follow-up.  Patient reports that for the past month he has been coughing every day with episodes of hemoptysis where he would have mostly streaks of blood mixed with phlegm.  He also reports significant weight loss as he states he weighed around 85 kg last year and now weighs 65 kg.  He states that he has no appetite.  He denies any fevers, chills or night sweats.  He is a former smoker and quit 2 months ago.  He started smoking when he was 73 years old and smoked on average 1 pack/day.  Pertinent to his clinical presentation is that he has a positive family history of lung cancer.  He states that his mother died of lung cancer.  On review of his imaging, he had an initial CT in 2022 which showed evidence of peripheral basilar predominant pulmonary fibrosis that was characterized as probable UIP.  In January 2025 he had a CT cardiac scoring which showed a 6 mm right upper lobe noncalcified lung nodule and was recommended to get a repeat CT scan in 6 months.  Repeat CT scan on July 1 showing a large area of consolidation in the left upper lobe measured as 4.6 x 6 cm.  He was given antibiotics to cover for multifocal pneumonia with cefixime, azithromycin  and Augmentin  with persistent symptoms despite  antibiotics.  Repeat CT chest was performed on August 1 which showed worsening with slight increase in the size of his dense masslike consolidation with presence of air bronchograms as well as a pleural effusion versus pleural thickening noted.  He was then referred to the hospital for inpatient management of his worsening clinical status.  In the hospital here he was started on ceftriaxone  and azithromycin  to cover for pneumonia. He states that he has not had any episodes of hemoptysis today or yesterday.   Pertinent  Medical History  As mentioned above  Significant Hospital Events: Including procedures, antibiotic start and stop dates in addition to other pertinent events     Interim History / Subjective:    Objective    Blood pressure (!) 117/56, pulse 83, temperature 98.2 F (36.8 C), temperature source Oral, resp. rate 18, height 6' 0.99 (1.854 m), weight 66.2 kg, SpO2 90%.        Intake/Output Summary (Last 24 hours) at 02/04/2024 1246 Last data filed at 02/04/2024 1141 Gross per 24 hour  Intake 2577.47 ml  Output 525 ml  Net 2052.47 ml   Filed Weights   02/03/24 2004 02/04/24 0434  Weight: 65.2 kg 66.2 kg    Examination: General: Ill appearing male Lungs: Crackles at left lung base  Cardiovascular: RRR, Nl S1, S2 Abdomen: Soft Extremities: No edema  Neuro: intact    Resolved problem list   Assessment and Plan   This is a 73 year old male with history  of tobacco use, family history of lung cancer, COPD, ILD, presumed limited systemic sclerosis, latent TB who is presenting to the hospital with a large masslike consolidation concerning for an infectious process versus malignancy with postobstructive pneumonia.  Given his latent TB history concern for active pulmonary TB as well.  His masslike consolidation has grown significantly over 6 months which is unusual for lung cancer.  He has had significant weight loss over the past year.  -Agree with CAP coverage with  ceftriaxone  and azithromycin  to cover for bacterial pneumonia - Plan for induced sputum x 3 to rule out active TB, 1 TB sample should be sent for PCR as well - If TB ruled out negative and no bacterial organisms found on culture, it would be reasonable to consider bronchoscopy for further evaluation - If he has further episodes of significant hemoptysis, can consider starting TXA nebs 500 mg 3 times daily for 72 hours - Pulmonary will continue to follow    Best Practice (right click and Reselect all SmartList Selections daily)   Per Primary   Labs   CBC: Recent Labs  Lab 02/03/24 1548 02/03/24 2044 02/03/24 2339  WBC 21.7* 19.6* 18.0*  NEUTROABS 19.3*  --   --   HGB 11.3* 9.9* 8.7*  HCT 35.7* 32.2* 26.8*  MCV 81.3 81.5 81.0  PLT 354 304 284    Basic Metabolic Panel: Recent Labs  Lab 02/03/24 1548 02/03/24 1723 02/03/24 1812 02/03/24 2044 02/03/24 2339  NA 140  --  137  --  139  K 3.7  --  3.7  --  3.2*  CL 98  --  99  --  104  CO2 29  --  30  --  27  GLUCOSE 169*  --  127*  --  124*  BUN 35*  --  32*  --  28*  CREATININE 1.15  --  0.91 1.00 0.91  CALCIUM  >15.0*  --  13.3*  --  12.4*  PHOS  --  2.5  --   --   --    GFR: Estimated Creatinine Clearance: 68.7 mL/min (by C-G formula based on SCr of 0.91 mg/dL). Recent Labs  Lab 02/03/24 1548 02/03/24 1550 02/03/24 1812 02/03/24 2044 02/03/24 2339  WBC 21.7*  --   --  19.6* 18.0*  LATICACIDVEN  --  3.2* 1.7  --   --     Liver Function Tests: Recent Labs  Lab 02/03/24 1548 02/03/24 2339  AST 22 19  ALT 12 12  ALKPHOS 116 70  BILITOT 0.6 0.5  PROT 7.4 5.4*  ALBUMIN  3.7 2.2*   Recent Labs  Lab 02/03/24 1723  LIPASE 12   No results for input(s): AMMONIA in the last 168 hours.  ABG    Component Value Date/Time   TCO2 27 04/28/2010 2029     Coagulation Profile: No results for input(s): INR, PROTIME in the last 168 hours.  Cardiac Enzymes: No results for input(s): CKTOTAL, CKMB,  CKMBINDEX, TROPONINI in the last 168 hours.  HbA1C: Hgb A1c MFr Bld  Date/Time Value Ref Range Status  07/06/2023 02:33 PM 5.9 (H) <5.7 % of total Hgb Final    Comment:    For someone without known diabetes, a hemoglobin  A1c value between 5.7% and 6.4% is consistent with prediabetes and should be confirmed with a  follow-up test. . For someone with known diabetes, a value <7% indicates that their diabetes is well controlled. A1c targets should be individualized based on duration of  diabetes, age, comorbid conditions, and other considerations. . This assay result is consistent with an increased risk of diabetes. . Currently, no consensus exists regarding use of hemoglobin A1c for diagnosis of diabetes for children. SABRA   12/25/2022 11:17 AM 6.0 (H) <5.7 % of total Hgb Final    Comment:    For someone without known diabetes, a hemoglobin  A1c value between 5.7% and 6.4% is consistent with prediabetes and should be confirmed with a  follow-up test. . For someone with known diabetes, a value <7% indicates that their diabetes is well controlled. A1c targets should be individualized based on duration of diabetes, age, comorbid conditions, and other considerations. . This assay result is consistent with an increased risk of diabetes. . Currently, no consensus exists regarding use of hemoglobin A1c for diagnosis of diabetes for children. .     CBG: No results for input(s): GLUCAP in the last 168 hours.  Review of Systems:   AS mentioned above   Past Medical History:  He,  has a past medical history of Anxiety, Atrial fibrillation (HCC), Blood transfusion without reported diagnosis, Cataract, Depression, Emphysema of lung (HCC), Heart murmur, Hyperlipidemia, Hypertension, PVD (peripheral vascular disease) (HCC), Raynaud's syndrome, Substance abuse (HCC), Systemic sclerosis (HCC), TB lung, latent (12/19/2019), and Transaminitis (06/18/2020).   Surgical History:    Past Surgical History:  Procedure Laterality Date   ABDOMINAL AORTOGRAM W/LOWER EXTREMITY Left 07/09/2023   Procedure: ABDOMINAL AORTOGRAM W/LOWER EXTREMITY;  Surgeon: Court Dorn PARAS, MD;  Location: [facility] IN[facility]ASIVE [facility];  Service: Cardiovascular;  Laterality: Left;   ATRIAL FIBRILLATION ABLATION     CATARACT EXTRACTION Right    COLONOSCOPY     ENDARTERECTOMY FEMORAL Left 09/02/2023   Procedure: LEFT LOWER EXTREMITY ANGIOGRAM WITH LEFT ILIAC  STENTING;  Surgeon: Gretta Lonni PARAS, MD;  Location: [facility] OR;  Service: Vascular;  Laterality: Left;   ENDARTERECTOMY FEMORAL Right 11/09/2023   Procedure: ENDARTERECTOMY, FEMORAL TO PROFUNDA;  Surgeon: Gretta Lonni PARAS, MD;  Location: [facility] OR;  Service: Vascular;  Laterality: Right;   EYE SURGERY     FEMORAL-POPLITEAL BYPASS GRAFT Left 09/02/2023   Procedure: LEFT FEMORAL ENDARTARECTOMY WITH PATCH ANGIOPLASTY;  Surgeon: Gretta Lonni PARAS, MD;  Location: [facility] OR;  Service: Vascular;  Laterality: Left;   FRACTURE SURGERY Left 1976   HERNIA REPAIR  2023   INGUINAL HERNIA REPAIR Right 03/23/2020   Procedure: OPEN RIGHT INGUINAL HERNIA REPAIR WITH MESH;  Surgeon: Gladis Cough, MD;  Location: [facility] OR;  Service: General;  Laterality: Right;   INSERTION OF MESH Right 03/23/2020   Procedure: INSERTION OF MESH;  Surgeon: Gladis Cough, MD;  Location: [facility] OR;  Service: General;  Laterality: Right;   LOWER EXTREMITY INTERVENTION N/A 04/27/2017   Procedure: LOWER EXTREMITY INTERVENTION;  Surgeon: Court Dorn PARAS, MD;  Location: [facility];  Service: Cardiovascular;  Laterality: N/A;   PATCH ANGIOPLASTY Right 11/09/2023   Procedure: ANGIOPLASTY, USING PATCH GRAFT, USING XENOSURE 1X14CM;  Surgeon: Gretta Lonni PARAS, MD;  Location: [facility];  Service: Vascular;  Laterality: Right;   PERIPHERAL VASCULAR ATHERECTOMY Right 04/27/2017   Procedure: PERIPHERAL VASCULAR ATHERECTOMY;  Surgeon: Court Dorn PARAS, MD;  Location: [facility] INVASIVE CV LAB;  Service:  Cardiovascular;  Laterality: Right;  Iliac   PERIPHERAL VASCULAR INTERVENTION Right 04/27/2017   Procedure: PERIPHERAL VASCULAR INTERVENTION;  Surgeon: Court Dorn PARAS, MD;  Location: [facility];  Service: Cardiovascular;  Laterality: Right;  Iliac     Social History:   reports that he  has been smoking cigarettes. He started smoking about 54 years ago. He has a 108.9 pack-year smoking history. He has never used smokeless tobacco. He reports current alcohol use of about 2.0 standard drinks of alcohol per week. He reports current drug use. Frequency: 1.00 time per week. Drug: Oxycodone .   Family History:  His family history includes Cancer in his father and mother. There is no history of Colon cancer, Rectal cancer, Heart disease, Pancreatic cancer, Stomach cancer, or Esophageal cancer.   Allergies Allergies  Allergen Reactions   Cymbalta  [Duloxetine  Hcl]     unknown   Effexor [Venlafaxine]     Feels bad   Wellbutrin  [Bupropion ]     sleepy   Zoloft [Sertraline Hcl]     Felt bad     Home Medications  Prior to Admission medications   Medication Sig Start Date End Date Taking? Authorizing Provider  albuterol  (PROVENTIL ) (2.5 MG/3ML) 0.083% nebulizer solution Take 3 mLs (2.5 mg total) by nebulization every 6 (six) hours as needed for wheezing or shortness of breath. 01/12/24  Yes Sebastian Beverley NOVAK, MD  albuterol  (VENTOLIN  HFA) 108 [phone] Base) MCG/ACT inhaler Use  2 inhalations  15 minutes apart  every 4 hours  to rescue Asthma Attack 05/20/21  Yes Tonita Fallow, MD  budesonide -glycopyrrolate -formoterol  (BREZTRI  AEROSPHERE) 160-9-4.8 MCG/ACT AERO inhaler Inhale 2 puffs into the lungs in the morning and at bedtime. 01/21/24  Yes Tamea Dedra CROME, MD  Cholecalciferol  250 MCG (10000 UT) CAPS Take 10,000 Units by mouth every evening.   Yes [provider]  dofetilide  (TIKOSYN ) 500 MCG capsule TAKE 1 CAPSULE BY MOUTH 2 TIMES DAILY. 08/17/23  Yes Court Dorn PARAS, MD   gabapentin  (NEURONTIN ) 300 MG capsule Take 1 capsule 3 x /day for Neuropathy Pain Patient taking differently: Take 300 mg by mouth 3 (three) times daily. 08/14/23  Yes Webb, Padonda B, FNP  ketoconazole  (NIZORAL ) 2 % cream Apply 1 Application topically daily as needed for irritation.   Yes [provider]  oxyCODONE  (OXY IR/ROXICODONE ) 5 MG immediate release tablet Take 5 mg by mouth every 4 (four) hours as needed for severe pain (pain score 7-10).   Yes [provider]  pyridOXINE  (VITAMIN B-6) 100 MG tablet Take 100 mg by mouth every evening.   Yes [provider]  triamcinolone  cream (KENALOG ) 0.5 % Apply 1 Application topically daily as needed (skin irritation).   Yes [provider]  atorvastatin  (LIPITOR ) 80 MG tablet Take 1 tablet Daily for Cholesterol Patient taking differently: Take 80 mg by mouth daily. Take 1 tablet Daily for Cholesterol 08/13/23   Webb, Padonda B, FNP  rivaroxaban  (XARELTO ) 20 MG TABS tablet Take 1 tablet (20 mg total) by mouth daily with supper. Patient not taking: Reported on 02/03/2024 12/01/23   Elna Loud P, PA-C      I spent 62 minutes caring for this patient today, including preparing to see the patient, obtaining a medical history , reviewing a separately obtained history, performing a medically appropriate examination and/or evaluation, counseling and educating the patient/family/caregiver, ordering medications, tests, or procedures, referring and communicating with other health care professionals (not separately reported), documenting clinical information in the electronic health record, independently interpreting results (not separately reported/billed) and communicating results to the patient/family/caregiver, and care coordination (not separately reported/billed)

## 2024-02-04 NOTE — Progress Notes (Signed)
 Mobility Specialist Progress Note:    02/04/24 1141  Mobility  Activity Turned to back - supine;Moved bed into chair position (x10 Leg Curls, Leg Ext, Ankle Pumps Bi lateral)  Level of Assistance Standby assist, set-up cues, supervision of patient - no hands on  Assistive Device None  Range of Motion/Exercises Right leg;Left leg  Activity Response Tolerated fair  Mobility Referral Yes  Mobility visit 1 Mobility  Mobility Specialist Start Time (ACUTE ONLY) 1141  Mobility Specialist Stop Time (ACUTE ONLY) 1150  Mobility Specialist Time Calculation (min) (ACUTE ONLY) 9 min   Pt unable to stand but willing to do bed level exercises. Pt had some difficulty but was able to perform above exercises. C/o of feeling fatigued/weak. Left pt in bed w/ all needs met.   Venetia Keel Mobility Specialist Please Neurosurgeon or Rehab Office at [phone]

## 2024-02-04 NOTE — Hospital Course (Addendum)
 73 y.o. male with medical history significant of patient with history of COPD, interstitial lung disease, patient has latent tuberculosis, atrial fibrillation on Tikosyn  and Xarelto , peripheral arterial disease, essential hypertension, anxiety disorder depression, history of hematuria with lung consolidation was on antibiotics by pulmonary.  Patient went to see pulmonary today due to near syncope and productive cough.  He was found to have significant dyspnea and tachycardia with heart rate in the 120s.  Patient noted to have oxygen saturation 90% on room air.  He was however noted to have hypercalcemia, leukocytosis with left shift and lactic acidosis.    Assessment and Plan:   Left upper lobe consolidation/mass with concern for AFB-ruled out - CT 8/1 noting slightly increasing size of LUL dense consolidation concerning for infectious versus malignancy.  Worsening cough with hemoptysis on presentation.  Pulmonology consulted and following closely.  Given patient's history of latent TB, AFB ordered from sputum x 3 (completed last collection 8/9).  So far AFB negative.  Empiric abx transitioned to zosyn .  Negative AFBs so unlikely active TB, precautions Dc'd.     Concern for Primary lung cancer - Left upper lobe mass with noted hypercalcemia on presentation given concern for primary lung cancer, especially squamous cell.  PTH RP pending.  CT abdomen pelvis with contrast noting left lateral psoas nodule concerning for metastasis as well.  Working to coordinate with IR and pulmonology on tissue sampling for diagnosis and possible staging.   Hypercalcemia of malignancy - Greater than 15 on presentation.  Combination with LUL mass, concern for primary carcinoma.  PTH appropriately low.  Pending PTHRP.  Aggressive IV fluid hydration.  S/p NS bolus, increase Lasix  to every 8 hours, and zoledronic  acid x 1.  Continues showing mild improvement, calcium  now down to 11.4.  Will recheck calcium  in AM.   Combined  pulmonary fibrosis and emphysema - Currently not hypoxic but likely contributing to underlying dyspnea.  Nebulizers on board.   Persistent atrial fibrillation - Resume home medication regimen.  Transition to heparin  drip.   Concern for severe sepsis - Leukocytosis, lactate greater than 3, tachycardia on presentation given concern for severe sepsis.  Blood cultures, IV fluid bolus, empiric antibiotics on board.  Lactate resolved.  Still has remarkable leukocytosis.  Continue to monitor blood pressure closely.   Bipolar 1 disorder - Continue home regimen

## 2024-02-04 NOTE — Progress Notes (Signed)
 PHARMACY - ANTICOAGULATION CONSULT NOTE  Pharmacy Consult for heparin  Indication: atrial fibrillation  Allergies  Allergen Reactions   Cymbalta  [Duloxetine  Hcl]     unknown   Effexor [Venlafaxine]     Feels bad   Wellbutrin  [Bupropion ]     sleepy   Zoloft [Sertraline Hcl]     Felt bad    Patient Measurements: Height: 6' 0.99 (185.4 cm) Weight: 66.2 kg (146 lb) IBW/kg (Calculated) : 79.88 HEPARIN  DW (KG): 65.2  Vital Signs: Temp: 97.7 F (36.5 C) (08/07 1919) Temp Source: Oral (08/07 1919) BP: 116/64 (08/07 1919) Pulse Rate: 100 (08/07 1919)  Labs: Recent Labs    02/03/24 1548 02/03/24 1812 02/03/24 2039 02/03/24 2044 02/03/24 2339 02/04/24 2136  HGB 11.3*  --   --  9.9* 8.7*  --   HCT 35.7*  --   --  32.2* 26.8*  --   PLT 354  --   --  304 284  --   APTT  --   --   --   --   --  42*  HEPARINUNFRC  --   --   --   --   --  0.10*  CREATININE 1.15 0.91  --  1.00 0.91  --   TROPONINIHS  --   --  22*  --  19*  --     Estimated Creatinine Clearance: 68.7 mL/min (by C-G formula based on SCr of 0.91 mg/dL).   Assessment: 73y.o. male admitted for lung consolidations on chest CT with accompanying hemoptysis. Has a hx of afib on tikosyn  and Xarelto . Previously, patient had hematuria and self discontinued Xarelto , was instructed to restart. Somewhat unclear if patient ever restarted anticoagulation.   Due to unclear hx of DOAC use, will monitor both heparin  level and aPTT to ensure correlation. Hgb and plts are stable in comparison to volume status. However, in setting of hemoptysis and unknown prior [facility], will forgo bolus for now  Heparin  level 0.1, aPTT 42 sec (appear to be correlating and both subtherapeutic) on infusion at 950 units/hr. No issues with line or bleeding reported per RN.  Goal of Therapy:  Heparin  level 0.3-0.7 units/ml aPTT 66-102 seconds Monitor platelets by anticoagulation protocol: Yes   Plan:  Increase heparin  infusion to 1150 units/hr Will  f/u heparin  level in 8 hours, no further aPTT monitoring  Vito Ralph, PharmD, BCPS Please see amion for complete clinical pharmacist phone list 02/04/2024,10:11 PM

## 2024-02-04 NOTE — TOC CM/SW Note (Signed)
 Transition of Care [facility]) - Inpatient Brief Assessment   Patient Details  Name: Bradley Ferguson MRN: [id number] Date of Birth: [birth year]  Transition of [facility]) CM/SW Contact:    Waddell Barnie Rama, RN Phone Number: 02/04/2024, 3:24 PM   Clinical Narrative: From home (boarding house) alone, has PCP and insurance on file, states has no HH services in place at this time , has a rollator at home.  States a friend will transport them home at [employer] and he has no family  support system, states gets medications from [facility] on [address].  Pta self ambulatory.       Transition of Care Asessment: Insurance and Status: Insurance coverage has been reviewed Patient has primary care physician: Yes Home environment has been reviewed: boarding house alone Prior level of function:: indep Prior/Current Home Services: Current home services (rollator) Social Drivers of Health Review: SDOH reviewed no interventions necessary Readmission risk has been reviewed: Yes Transition of care needs: transition of care needs identified, TOC will continue to follow

## 2024-02-04 NOTE — Progress Notes (Signed)
 Progress Note   Patient: Bradley Ferguson Ferguson DOB: 24-Nov-1950 DOA: 02/03/2024  DOS: the patient was seen and examined on 02/04/2024   Brief hospital course:  73 y.o. male with medical history significant of patient with history of COPD, interstitial lung disease, patient has latent tuberculosis, atrial fibrillation on Tikosyn  and Xarelto , peripheral arterial disease, essential hypertension, anxiety disorder depression, history of hematuria with lung consolidation was on antibiotics by pulmonary.  Patient went to see pulmonary today due to near syncope and productive cough.  He was found to have significant dyspnea and tachycardia with heart rate in the 120s.  Patient noted to have oxygen saturation 90% on room air.  He was however noted to have hypercalcemia, leukocytosis with left shift and lactic acidosis.   Assessment and Plan:  Left upper lobe consolidation/mass with concern for AFB - CT 8/1 noting slightly increasing size of LUL dense consolidation concerning for infectious versus malignancy.  Worsening cough with hemoptysis.  Had been evaluated outpatient pulmonology office with recommendations to be admitted for bronchoscopy and further evaluation.  Given patient's history of latent TB, AFB ordered.  Other differentials include postobstructive pneumonia or primary lung adenocarcinoma.  Currently on negative pressure isolation.  Empiric broad-spectrum antibiotics on board.  Pulmonology following closely.  Hypercalcemia - Greater than 15 on presentation.  Combination with LUL mass, concern for primary carcinoma.  PTH pending.  Will order PTHRP.  Aggressive IV fluid hydration with as needed diuretic therapy.  Calcium  showing improvement this morning, down to 12.4.  Combined pulmonary fibrosis and emphysema - Currently not hypoxic but likely contributing to underlying dyspnea.  Persistent atrial fibrillation - Resume home medication regimen.  Lovenox  on board.  Concern for severe  sepsis - Leukocytosis, lactate greater than 3, tachycardia on presentation given concern for severe sepsis.  Blood cultures, IV fluid bolus, empiric antibiotics on board.  Lactate resolved.  Continue to monitor blood pressure closely.  Bipolar 1 disorder - Continue home regimen  Subjective: Past patient resting comfortably this morning.  Still complaining of cough and intermittent bloody tinged sputum.  Does admit to some night sweats.  Denies any frank fever, shortness of breath, chest pain, nausea, vomiting, abdominal pain.  Physical Exam:  Vitals:   02/04/24 0059 02/04/24 0434 02/04/24 0826 02/04/24 0914  BP: 105/61 97/67 (!) 117/58   Pulse: 89  89   Resp: (!) 22  18   Temp: (!) 97.4 F (36.3 C) 98 F (36.7 C) 98.5 F (36.9 C)   TempSrc: Oral Oral Oral   SpO2: 94% 93% 90% 92%  Weight:  66.2 kg    Height:        GENERAL:  Alert, pleasant, no acute distress, disheveled HEENT:  EOMI CARDIOVASCULAR: Irregularly irregular RESPIRATORY: Coarse rales bilaterally  GASTROINTESTINAL:  Soft, nontender, nondistended EXTREMITIES:  No LE edema bilaterally NEURO:  No new focal deficits appreciated SKIN:  No rashes noted PSYCH:  Appropriate mood and affect     Data Reviewed:  Imaging Studies: DG Chest 2 View Result Date: 02/03/2024 CLINICAL DATA:  Shortness of breath.  Tachycardia. EXAM: CHEST - 2 VIEW COMPARISON:  12/29/2023 and CT chest 01/29/2024. FINDINGS: The trachea is midline. Heart size stable. Cavitary masslike consolidation in the left upper lobe has progressed from 12/29/2023. Associated pleural thickening and pleural fluid in the lateral and lower left hemithorax. Mild coarsening of the peripheral pulmonary markings. IMPRESSION: 1. Worsening cavitary masslike consolidation in the left upper lobe, worrisome for primary bronchogenic carcinoma. Pneumonia is not excluded. 2.  Partially loculated small left pleural effusion and/or thickening. 3. Interstitial lung disease, better seen  on 01/29/2024. Electronically Signed   By: Newell Eke M.D.   On: 02/03/2024 16:32   CT CHEST WO CONTRAST Result Date: 02/03/2024 CLINICAL DATA:  Pneumonia, complications suspected, x-ray done. * Tracking Code: BO * EXAM: CT CHEST WITHOUT CONTRAST TECHNIQUE: Multidetector CT imaging of the chest was performed following the standard protocol without IV contrast. RADIATION DOSE REDUCTION: This exam was performed according to the departmental dose-optimization program which includes automated exposure control, adjustment of the mA and/or kV according to patient size and/or use of iterative reconstruction technique. COMPARISON:  CT December 29, 2023 and June 04, 2021 FINDINGS: Cardiovascular: Aortic atherosclerosis. Three-vessel coronary artery calcifications/stents. Calcifications of the aortic valve and annulus. Calcifications of the mitral annulus. Normal size heart. No significant pericardial effusion/thickening. Mediastinum/Nodes: No suspicious thyroid  nodule. Prominent mediastinal lymph nodes are similar prior for instance an AP window lymph node measuring 9 mm in short axis on image 56/301 is unchanged. The esophagus is grossly unremarkable. Lungs/Pleura: Persistent dense consolidation with air bronchograms in the left upper lobe measuring 7.5 x 5.6 cm on image 46/302 previously 7.0 x 5.6 cm. Increased size of the adjacent pleural effusion/pleural thickening. Additional consolidative nodularity in the left lower lobe has increased from prior for instance measuring 17 x 10 mm on image 73/302 previously 14 x 7 mm. Similar findings of basilar predominant interstitial lung disease. Upper Abdomen: No acute abnormality. Musculoskeletal: No aggressive lytic or blastic lesion of bone. Multilevel degenerative changes spine. Diffuse demineralization of bone. IMPRESSION: 1. Slight increase in size of the dense mass like consolidation with air bronchograms in the left upper lobe with increased adjacent pleural  effusion/pleural thickening and increased size of additional consolidative nodularity in the left lower lobe. Findings are nonspecific and may reflect an infectious/inflammatory process with possible treatment failure, however underlying malignancy is a pertinent differential consideration warranting further evaluation. Suggest pulmonary consultation with consideration for direct tissue sampling and/or nuclear medicine PET-CT. 2. Prominent mediastinal lymph nodes are nonspecific but similar prior suggest continued attention on follow-up imaging. 3. Similar findings of basilar predominant interstitial lung disease. 4. Aortic atherosclerosis. Aortic Atherosclerosis (ICD10-I70.0). Electronically Signed   By: Reyes Holder M.D.   On: 02/03/2024 15:06   VAS US  LOWER EXTREMITY ARTERIAL DUPLEX Result Date: 01/19/2024 LOWER EXTREMITY ARTERIAL DUPLEX STUDY Patient Name:  CLIFTON SAFLEY  Date of Exam:   01/19/2024 Medical Rec #: [id number]       Accession #:    [id number] Date of Birth: 06-Jul-1950       Patient Gender: M Patient Age:   19 years Exam Location:  [address] Procedure:      VAS US  LOWER EXTREMITY ARTERIAL DUPLEX Referring Phys: [facility] --------------------------------------------------------------------------------  Indications: Peripheral artery disease. High Risk Factors: Hypertension, hyperlipidemia, current smoker.  Vascular Interventions: Successful diamondback orbital rotation                         arthrectomy, PT                         and covered stent at a subtotal calcified proximal                         right  common iliac artery on 04/27/2017. Angioplasty and                         stent                         of left common and external iliac artery, and left                         common femoral endarterectomy including                         profundoplasty                         and endarterectomy the proximal SFA with bovine                          pericardial patch angioplasty on 09/02/2023. Current ABI:            0.5/0.54 Comparison Study: 04/09/17 Performing Technologist: Garnette Rockers  Examination Guidelines: A complete evaluation includes B-mode imaging, spectral Doppler, color Doppler, and power Doppler as needed of all accessible portions of each vessel. Bilateral testing is considered an integral part of a complete examination. Limited examinations for reoccurring indications may be performed as noted.  +-----------+--------+-----+---------------+----------+-----------------------+ RIGHT      PSV cm/sRatioStenosis       Waveform  Comments                +-----------+--------+-----+---------------+----------+-----------------------+ CFA Distal 27                          biphasic                          +-----------+--------+-----+---------------+----------+-----------------------+ DFA        64           50-74% stenosismonophasic                        +-----------+--------+-----+---------------+----------+-----------------------+ SFA Prox                occluded                                         +-----------+--------+-----+---------------+----------+-----------------------+ SFA Mid                 occluded                                         +-----------+--------+-----+---------------+----------+-----------------------+ SFA Distal              occluded                                         +-----------+--------+-----+---------------+----------+-----------------------+ POP Prox   19                          monophasicReconstitution from DFA +-----------+--------+-----+---------------+----------+-----------------------+ POP Distal 27  monophasic                        +-----------+--------+-----+---------------+----------+-----------------------+ ATA Distal 19                          monophasic                         +-----------+--------+-----+---------------+----------+-----------------------+ PTA Distal 11                          monophasic                        +-----------+--------+-----+---------------+----------+-----------------------+ PERO Distal19                          monophasic                        +-----------+--------+-----+---------------+----------+-----------------------+ DFA Mid - 90 cm/s - monophasic DFA Dst - 51 cm/s - monophasic.  Summary: Right: 50-74% stenosis noted in the deep femoral artery. Total occlusion noted in the superficial femoral artery.  See table(s) above for measurements and observations. Electronically signed by Lonni Gaskins MD on 01/19/2024 at 12:31:40 PM.    Final    VAS US  ABI WITH/WO TBI Result Date: 01/19/2024  LOWER EXTREMITY DOPPLER STUDY Patient Name:  ROSARIO KUSHNER  Date of Exam:   01/19/2024 Medical Rec #: [id number]       Accession #:    [id number] Date of Birth: 1950-10-16       Patient Gender: M Patient Age:   19 years Exam Location:  [address] Procedure:      VAS US  ABI WITH/WO TBI Referring Phys: [facility] --------------------------------------------------------------------------------  Indications: Peripheral artery disease. High Risk Factors: Hypertension, hyperlipidemia, current smoker.  Vascular Interventions: Successful diamondback orbital rotation                         arthrectomy, PT                         and covered stent at a subtotal calcified proximal                         right                         common iliac artery on 04/27/2017. Angioplasty and                         stent                         of left common and external iliac artery, and left                         common femoral endarterectomy including                         profundoplasty                         and endarterectomy the proximal SFA with bovine  pericardial patch angioplasty on 09/02/2023. Comparison Study: 10/13/23  Performing Technologist: Garnette Rockers  Examination Guidelines: A complete evaluation includes at minimum, Doppler waveform signals and systolic blood pressure reading at the level of bilateral brachial, anterior tibial, and posterior tibial arteries, when vessel segments are accessible. Bilateral testing is considered an integral part of a complete examination. Photoelectric Plethysmograph (PPG) waveforms and toe systolic pressure readings are included as required and additional duplex testing as needed. Limited examinations for reoccurring indications may be performed as noted.  ABI Findings: +---------+------------------+-----+-------------------+--------+ Right    Rt Pressure (mmHg)IndexWaveform           Comment  +---------+------------------+-----+-------------------+--------+ Brachial 139                                                +---------+------------------+-----+-------------------+--------+ PTA      53                0.38 dampened monophasic         +---------+------------------+-----+-------------------+--------+ DP       70                0.50 dampened monophasic         +---------+------------------+-----+-------------------+--------+ Great Toe0                 0.00 Absent                      +---------+------------------+-----+-------------------+--------+ +---------+------------------+-----+----------+-------+ Left     Lt Pressure (mmHg)IndexWaveform  Comment +---------+------------------+-----+----------+-------+ Brachial 139                                      +---------+------------------+-----+----------+-------+ PTA      75                0.54 monophasic        +---------+------------------+-----+----------+-------+ DP       66                0.47 monophasic        +---------+------------------+-----+----------+-------+ Great Toe34                0.24 Abnormal          +---------+------------------+-----+----------+-------+  +-------+-----------+-----------+------------+------------+ ABI/TBIToday's ABIToday's TBIPrevious ABIPrevious TBI +-------+-----------+-----------+------------+------------+ Right  0.5        0.0        0.28        0.0          +-------+-----------+-----------+------------+------------+ Left   0.54       0.24       0.64        0.3          +-------+-----------+-----------+------------+------------+  Summary: Right: Resting right ankle-brachial index indicates moderate right lower extremity arterial disease. The right toe-brachial index is abnormal. Left: Resting left ankle-brachial index indicates moderate left lower extremity arterial disease. The left toe-brachial index is abnormal. *See table(s) above for measurements and observations.  Electronically signed by Lonni Gaskins MD on 01/19/2024 at 11:19:29 AM.    Final     There are no new results to review at this time.  Previous records (including but not limited to H&P, progress notes, nursing notes, TOC management) were reviewed in assessment of this patient.  Labs: CBC: Recent  Labs  Lab 02/03/24 1548 02/03/24 2044 02/03/24 2339  WBC 21.7* 19.6* 18.0*  NEUTROABS 19.3*  --   --   HGB 11.3* 9.9* 8.7*  HCT 35.7* 32.2* 26.8*  MCV 81.3 81.5 81.0  PLT 354 304 284   Basic Metabolic Panel: Recent Labs  Lab 02/03/24 1548 02/03/24 1723 02/03/24 1812 02/03/24 2044 02/03/24 2339  NA 140  --  137  --  139  K 3.7  --  3.7  --  3.2*  CL 98  --  99  --  104  CO2 29  --  30  --  27  GLUCOSE 169*  --  127*  --  124*  BUN 35*  --  32*  --  28*  CREATININE 1.15  --  0.91 1.00 0.91  CALCIUM  >15.0*  --  13.3*  --  12.4*  PHOS  --  2.5  --   --   --    Liver Function Tests: Recent Labs  Lab 02/03/24 1548 02/03/24 2339  AST 22 19  ALT 12 12  ALKPHOS 116 70  BILITOT 0.6 0.5  PROT 7.4 5.4*  ALBUMIN  3.7 2.2*   CBG: No results for input(s): GLUCAP in the last 168 hours.  Scheduled Meds:  albuterol   2.5 mg  Inhalation QID   budesonide -glycopyrrolate -formoterol   2 puff Inhalation BID   cholecalciferol   10,000 Units Oral QPM   dofetilide   500 mcg Oral BID   enoxaparin  (LOVENOX ) injection  40 mg Subcutaneous Q24H   gabapentin   300 mg Oral TID   pyridOXINE   100 mg Oral QPM   Continuous Infusions:  sodium chloride      sodium chloride  125 mL/hr at 02/04/24 0448   cefTRIAXone  (ROCEPHIN )  IV     doxycycline  (VIBRAMYCIN ) IV     PRN Meds:.albuterol , HYDROcodone -acetaminophen , ketoconazole , triamcinolone  cream  Family Communication: None at bedside  Disposition: Status is: Inpatient Remains inpatient appropriate because: Mass versus pneumonia     Time spent: 38 minutes  Length of inpatient stay: 1 days  Author: Carliss LELON Canales, DO 02/04/2024 10:07 AM  For on call review [email or web address].

## 2024-02-04 NOTE — Progress Notes (Signed)
 9058: Notified by lab via phone call that pt's Quantiferon Gold lab drawn last night is now too old to sent to lab for testing. New order entry needed and pt will need to be redrawn. Morning is best for this per lab so that sample can be sent out in time. Secure Chat to MD for another lab order.

## 2024-02-04 NOTE — Progress Notes (Signed)
 PHARMACY - ANTICOAGULATION CONSULT NOTE  Pharmacy Consult for heparin  Indication: atrial fibrillation  Allergies  Allergen Reactions   Cymbalta  [Duloxetine  Hcl]     unknown   Effexor [Venlafaxine]     Feels bad   Wellbutrin  [Bupropion ]     sleepy   Zoloft [Sertraline Hcl]     Felt bad    Patient Measurements: Height: 6' 0.99 (185.4 cm) Weight: 66.2 kg (146 lb) IBW/kg (Calculated) : 79.88 HEPARIN  DW (KG): 65.2  Vital Signs: Temp: 98.2 F (36.8 C) (08/07 1139) Temp Source: Oral (08/07 1139) BP: 117/56 (08/07 1139) Pulse Rate: 83 (08/07 1139)  Labs: Recent Labs    02/03/24 1548 02/03/24 1812 02/03/24 2039 02/03/24 2044 02/03/24 2339  HGB 11.3*  --   --  9.9* 8.7*  HCT 35.7*  --   --  32.2* 26.8*  PLT 354  --   --  304 284  CREATININE 1.15 0.91  --  1.00 0.91  TROPONINIHS  --   --  22*  --  19*    Estimated Creatinine Clearance: 68.7 mL/min (by C-G formula based on SCr of 0.91 mg/dL).   Medical History: Past Medical History:  Diagnosis Date   Anxiety    Atrial fibrillation (HCC)    Blood transfusion without reported diagnosis    Cataract    Depression    Emphysema of lung (HCC)    Heart murmur    Hyperlipidemia    Hypertension    PVD (peripheral vascular disease) (HCC)    Raynaud's syndrome    Substance abuse (HCC)    Tobacco   Systemic sclerosis (HCC)    TB lung, latent 12/19/2019   s/p INH x 3 months   Transaminitis 06/18/2020    Medications:  Medications Prior to Admission  Medication Sig Dispense Refill Last Dose/Taking   albuterol  (PROVENTIL ) (2.5 MG/3ML) 0.083% nebulizer solution Take 3 mLs (2.5 mg total) by nebulization every 6 (six) hours as needed for wheezing or shortness of breath. 150 mL 1 Past Week   albuterol  (VENTOLIN  HFA) 108 (90 Base) MCG/ACT inhaler Use  2 inhalations  15 minutes apart  every 4 hours  to rescue Asthma Attack 48 g 3 02/02/2024 Morning   budesonide -glycopyrrolate -formoterol  (BREZTRI  AEROSPHERE) 160-9-4.8 MCG/ACT  AERO inhaler Inhale 2 puffs into the lungs in the morning and at bedtime. 3 each 0 Past Week   Cholecalciferol  250 MCG (10000 UT) CAPS Take 10,000 Units by mouth every evening.   Past Week   dofetilide  (TIKOSYN ) 500 MCG capsule TAKE 1 CAPSULE BY MOUTH 2 TIMES DAILY. 180 capsule 3 02/02/2024 Evening   gabapentin  (NEURONTIN ) 300 MG capsule Take 1 capsule 3 x /day for Neuropathy Pain (Patient taking differently: Take 300 mg by mouth 3 (three) times daily.) 270 capsule 3 02/03/2024 Morning   ketoconazole  (NIZORAL ) 2 % cream Apply 1 Application topically daily as needed for irritation.   Past Week   oxyCODONE  (OXY IR/ROXICODONE ) 5 MG immediate release tablet Take 5 mg by mouth every 4 (four) hours as needed for severe pain (pain score 7-10).   Unknown   pyridOXINE  (VITAMIN B-6) 100 MG tablet Take 100 mg by mouth every evening.   Past Week   triamcinolone  cream (KENALOG ) 0.5 % Apply 1 Application topically daily as needed (skin irritation).   Past Month   atorvastatin  (LIPITOR ) 80 MG tablet Take 1 tablet Daily for Cholesterol (Patient taking differently: Take 80 mg by mouth daily. Take 1 tablet Daily for Cholesterol) 90 tablet 0 02/01/2024   rivaroxaban  (  XARELTO ) 20 MG TABS tablet Take 1 tablet (20 mg total) by mouth daily with supper. (Patient not taking: Reported on 02/03/2024) 30 tablet 6 Not Taking    Assessment: 73y.o. male admitted for lung consolidations on chest CT with accompanying hemoptysis. Has a hx of afib on tikosyn  and Xarelto . Previously, patient had hematuria and self discontinued Xarelto , was instructed to restart. Somewhat unclear if patient ever restarted anticoagulation.   Was initiated on Lovenox  40mg  q24hr upon admission and received x1 dose. Heparin  initiation necessary for full anticoagulation while patient is on tikosyn  for afib.  Due to unclear hx of DOAC use, will monitor both heparin  level and aPTT to ensure correlation. Hgb and plts are stable in comparison to volume status. However,  in setting of hemoptysis and unknown prior [facility], will forgo bolus for now. Will aim to increase infusion rate as needed to prevent any further bleeding incidents.  Goal of Therapy:  Heparin  level 0.3-0.7 units/ml aPTT 66-102 seconds Monitor platelets by anticoagulation protocol: Yes   Plan:  Start heparin  infusion at 950 units/hr Check anti-Xa level in 8 hours and daily until levels correlate Daily CBC Continue to monitor H&H and platelets, signs and symptoms of bleeding   Dyamond Tolosa M Shauntia Levengood 02/04/2024,12:33 PM

## 2024-02-05 DIAGNOSIS — J410 Simple chronic bronchitis: Secondary | ICD-10-CM

## 2024-02-05 DIAGNOSIS — A419 Sepsis, unspecified organism: Secondary | ICD-10-CM | POA: Diagnosis not present

## 2024-02-05 DIAGNOSIS — I739 Peripheral vascular disease, unspecified: Secondary | ICD-10-CM

## 2024-02-05 DIAGNOSIS — R634 Abnormal weight loss: Secondary | ICD-10-CM | POA: Diagnosis not present

## 2024-02-05 DIAGNOSIS — R918 Other nonspecific abnormal finding of lung field: Secondary | ICD-10-CM | POA: Diagnosis not present

## 2024-02-05 DIAGNOSIS — Z227 Latent tuberculosis: Secondary | ICD-10-CM | POA: Diagnosis not present

## 2024-02-05 DIAGNOSIS — J849 Interstitial pulmonary disease, unspecified: Secondary | ICD-10-CM | POA: Diagnosis not present

## 2024-02-05 LAB — HEPARIN LEVEL (UNFRACTIONATED)
Heparin Unfractionated: 0.28 [IU]/mL — ABNORMAL LOW (ref 0.30–0.70)
Heparin Unfractionated: 0.26 [IU]/mL — ABNORMAL LOW (ref 0.30–0.70)
Heparin Unfractionated: 0.32 [IU]/mL (ref 0.30–0.70)

## 2024-02-05 LAB — CBC
HCT: 28.7 % — ABNORMAL LOW (ref 39.0–52.0)
Hemoglobin: 9.2 g/dL — ABNORMAL LOW (ref 13.0–17.0)
MCH: 26.1 pg (ref 26.0–34.0)
MCHC: 32.1 g/dL (ref 30.0–36.0)
MCV: 81.5 fL (ref 80.0–100.0)
Platelets: 258 K/uL (ref 150–400)
RBC: 3.52 MIL/uL — ABNORMAL LOW (ref 4.22–5.81)
RDW: 18 % — ABNORMAL HIGH (ref 11.5–15.5)
WBC: 15.4 K/uL — ABNORMAL HIGH (ref 4.0–10.5)
nRBC: 0 % (ref 0.0–0.2)

## 2024-02-05 LAB — BASIC METABOLIC PANEL WITH GFR
Anion gap: 11 (ref 5–15)
BUN: 16 mg/dL (ref 8–23)
CO2: 24 mmol/L (ref 22–32)
Calcium: 12.8 mg/dL — ABNORMAL HIGH (ref 8.9–10.3)
Chloride: 103 mmol/L (ref 98–111)
Creatinine, Ser: 0.82 mg/dL (ref 0.61–1.24)
GFR, Estimated: >60 mL/min (ref >60)
Glucose, Bld: 90 mg/dL (ref 70–99)
Potassium: 3.5 mmol/L (ref 3.5–5.1)
Sodium: 138 mmol/L (ref 135–145)

## 2024-02-05 LAB — MAGNESIUM: Magnesium: 1.6 mg/dL — ABNORMAL LOW (ref 1.7–2.4)

## 2024-02-05 LAB — PARATHYROID HORMONE, INTACT (NO CA)
PTH: 6 pg/mL — ABNORMAL LOW (ref 15–65)
PTH: <6 pg/mL — ABNORMAL LOW (ref 15–65)

## 2024-02-05 LAB — CALCIUM, IONIZED: Calcium, Ionized, Serum: 7.3 mg/dL — ABNORMAL HIGH (ref 4.5–5.6)

## 2024-02-05 MED ORDER — MAGNESIUM SULFATE 4 GM/100ML IV SOLN
4.0000 g | Freq: Once | INTRAVENOUS | Status: AC
  Administered 2024-02-05: 4 g via INTRAVENOUS
  Filled 2024-02-05: qty 100

## 2024-02-05 MED ORDER — DOXYCYCLINE HYCLATE 100 MG PO TABS
100.0000 mg | ORAL_TABLET | Freq: Two times a day (BID) | ORAL | Status: AC
  Administered 2024-02-05 – 2024-02-09 (×13): 100 mg via ORAL
  Filled 2024-02-05 (×9): qty 1

## 2024-02-05 MED ORDER — POTASSIUM CHLORIDE CRYS ER 20 MEQ PO TBCR
40.0000 meq | EXTENDED_RELEASE_TABLET | ORAL | Status: AC
  Administered 2024-02-05 (×2): 40 meq via ORAL
  Filled 2024-02-05 (×2): qty 2

## 2024-02-05 NOTE — Progress Notes (Signed)
 Please be advised that the above-named patient will require a short-term nursing home stay-anticipated 30 days or less for rehabilitation and strengthening. The plan is for return home.

## 2024-02-05 NOTE — Progress Notes (Signed)
 Progress Note   Patient: Bradley Ferguson FMW:[id number] DOB: 09/18/1950 DOA: 02/03/2024  DOS: the patient was seen and examined on 02/05/2024   Brief hospital course:  73 y.o. male with medical history significant of patient with history of COPD, interstitial lung disease, patient has latent tuberculosis, atrial fibrillation on Tikosyn  and Xarelto , peripheral arterial disease, essential hypertension, anxiety disorder depression, history of hematuria with lung consolidation was on antibiotics by pulmonary.  Patient went to see pulmonary today due to near syncope and productive cough.  He was found to have significant dyspnea and tachycardia with heart rate in the 120s.  Patient noted to have oxygen saturation 90% on room air.  He was however noted to have hypercalcemia, leukocytosis with left shift and lactic acidosis.    Assessment and Plan:   Left upper lobe consolidation/mass with concern for AFB - CT 8/1 noting slightly increasing size of LUL dense consolidation concerning for infectious versus malignancy.  Worsening cough with hemoptysis on presentation.  Neurology consulted and following closely.  Given patient's history of latent TB, AFB ordered from sputum x 3.  Other differentials include postobstructive pneumonia or primary lung adenocarcinoma.  Currently on negative pressure isolation.  Empiric ceftriaxone  plus azithromycin  on board.  Pending AFB/TB rule out from sputum, may pursue bronchoscopy early next week.   Hypercalcemia - Greater than 15 on presentation.  Combination with LUL mass, concern for primary carcinoma.  PTH pending.  Pending PTHRP.  Aggressive IV fluid hydration with as needed diuretic therapy.  Calcium  showing slow improvement.  Combined pulmonary fibrosis and emphysema - Currently not hypoxic but likely contributing to underlying dyspnea.  Nebulizers on board.   Persistent atrial fibrillation - Resume home medication regimen.  Transition to heparin  drip.   Concern for  severe sepsis - Leukocytosis, lactate greater than 3, tachycardia on presentation given concern for severe sepsis.  Blood cultures, IV fluid bolus, empiric antibiotics on board.  Lactate resolved.  Continue to monitor blood pressure closely.   Bipolar 1 disorder - Continue home regimen   Subjective: Patient resting comfortably this morning.  Still complaining of cough.  Was able to get sputum for sputum samples.  Denies any worsening shortness of breath, chest pain, nausea, vomiting, abdominal pain.  Physical Exam:  Vitals:   02/04/24 2052 02/05/24 0057 02/05/24 0336 02/05/24 0836  BP:  115/65 118/77 (!) 141/86  Pulse:  87 77 79  Resp:  18 20 20   Temp:  98 F (36.7 C) 97.9 F (36.6 C) 98 F (36.7 C)  TempSrc:  Oral Oral Oral  SpO2: 92% 92% 92% 91%  Weight:   68.9 kg   Height:        GENERAL:  Alert, pleasant, no acute distress, disheveled HEENT:  EOMI CARDIOVASCULAR: Irregularly irregular RESPIRATORY: Coarse rales bilaterally, coarse cough GASTROINTESTINAL:  Soft, nontender, nondistended EXTREMITIES:  No LE edema bilaterally NEURO:  No new focal deficits appreciated SKIN:  No rashes noted PSYCH:  Appropriate mood and affect    Data Reviewed:  Imaging Studies: DG Chest 2 View Result Date: 02/03/2024 CLINICAL DATA:  Shortness of breath.  Tachycardia. EXAM: CHEST - 2 VIEW COMPARISON:  12/29/2023 and CT chest 01/29/2024. FINDINGS: The trachea is midline. Heart size stable. Cavitary masslike consolidation in the left upper lobe has progressed from 12/29/2023. Associated pleural thickening and pleural fluid in the lateral and lower left hemithorax. Mild coarsening of the peripheral pulmonary markings. IMPRESSION: 1. Worsening cavitary masslike consolidation in the left upper lobe, worrisome for primary bronchogenic carcinoma.  Pneumonia is not excluded. 2. Partially loculated small left pleural effusion and/or thickening. 3. Interstitial lung disease, better seen on 01/29/2024.  Electronically Signed   By: Newell Eke M.D.   On: 02/03/2024 16:32   CT CHEST WO CONTRAST Result Date: 02/03/2024 CLINICAL DATA:  Pneumonia, complications suspected, x-ray done. * Tracking Code: BO * EXAM: CT CHEST WITHOUT CONTRAST TECHNIQUE: Multidetector CT imaging of the chest was performed following the standard protocol without IV contrast. RADIATION DOSE REDUCTION: This exam was performed according to the departmental dose-optimization program which includes automated exposure control, adjustment of the mA and/or kV according to patient size and/or use of iterative reconstruction technique. COMPARISON:  CT December 29, 2023 and June 04, 2021 FINDINGS: Cardiovascular: Aortic atherosclerosis. Three-vessel coronary artery calcifications/stents. Calcifications of the aortic valve and annulus. Calcifications of the mitral annulus. Normal size heart. No significant pericardial effusion/thickening. Mediastinum/Nodes: No suspicious thyroid  nodule. Prominent mediastinal lymph nodes are similar prior for instance an AP window lymph node measuring 9 mm in short axis on image 56/301 is unchanged. The esophagus is grossly unremarkable. Lungs/Pleura: Persistent dense consolidation with air bronchograms in the left upper lobe measuring 7.5 x 5.6 cm on image 46/302 previously 7.0 x 5.6 cm. Increased size of the adjacent pleural effusion/pleural thickening. Additional consolidative nodularity in the left lower lobe has increased from prior for instance measuring 17 x 10 mm on image 73/302 previously 14 x 7 mm. Similar findings of basilar predominant interstitial lung disease. Upper Abdomen: No acute abnormality. Musculoskeletal: No aggressive lytic or blastic lesion of bone. Multilevel degenerative changes spine. Diffuse demineralization of bone. IMPRESSION: 1. Slight increase in size of the dense mass like consolidation with air bronchograms in the left upper lobe with increased adjacent pleural effusion/pleural  thickening and increased size of additional consolidative nodularity in the left lower lobe. Findings are nonspecific and may reflect an infectious/inflammatory process with possible treatment failure, however underlying malignancy is a pertinent differential consideration warranting further evaluation. Suggest pulmonary consultation with consideration for direct tissue sampling and/or nuclear medicine PET-CT. 2. Prominent mediastinal lymph nodes are nonspecific but similar prior suggest continued attention on follow-up imaging. 3. Similar findings of basilar predominant interstitial lung disease. 4. Aortic atherosclerosis. Aortic Atherosclerosis (ICD10-I70.0). Electronically Signed   By: Reyes Holder M.D.   On: 02/03/2024 15:06   VAS US  LOWER EXTREMITY ARTERIAL DUPLEX Result Date: 01/19/2024 LOWER EXTREMITY ARTERIAL DUPLEX STUDY Patient Name:  MARQUAN VOKES  Date of Exam:   01/19/2024 Medical Rec #: [id number]       Accession #:    [id number] Date of Birth: [birth year]       Patient Gender: M Patient Age:   66 years Exam Location:  [address] Procedure:      VAS US  LOWER EXTREMITY ARTERIAL DUPLEX Referring Phys: [facility] --------------------------------------------------------------------------------  Indications: Peripheral artery disease. High Risk Factors: Hypertension, hyperlipidemia, current smoker.  Vascular Interventions: Successful diamondback orbital rotation                         arthrectomy, PT                         and covered stent at a subtotal calcified proximal                         right  common iliac artery on 04/27/2017. Angioplasty and                         stent                         of left common and external iliac artery, and left                         common femoral endarterectomy including                         profundoplasty                         and endarterectomy the proximal SFA with bovine                         pericardial patch  angioplasty on 09/02/2023. Current ABI:            0.5/0.54 Comparison Study: 04/09/17 Performing Technologist: Garnette Rockers  Examination Guidelines: A complete evaluation includes B-mode imaging, spectral Doppler, color Doppler, and power Doppler as needed of all accessible portions of each vessel. Bilateral testing is considered an integral part of a complete examination. Limited examinations for reoccurring indications may be performed as noted.  +-----------+--------+-----+---------------+----------+-----------------------+ RIGHT      PSV cm/sRatioStenosis       Waveform  Comments                +-----------+--------+-----+---------------+----------+-----------------------+ CFA Distal 27                          biphasic                          +-----------+--------+-----+---------------+----------+-----------------------+ DFA        64           50-74% stenosismonophasic                        +-----------+--------+-----+---------------+----------+-----------------------+ SFA Prox                occluded                                         +-----------+--------+-----+---------------+----------+-----------------------+ SFA Mid                 occluded                                         +-----------+--------+-----+---------------+----------+-----------------------+ SFA Distal              occluded                                         +-----------+--------+-----+---------------+----------+-----------------------+ POP Prox   19                          monophasicReconstitution from DFA +-----------+--------+-----+---------------+----------+-----------------------+ POP Distal 27  monophasic                        +-----------+--------+-----+---------------+----------+-----------------------+ ATA Distal 19                          monophasic                         +-----------+--------+-----+---------------+----------+-----------------------+ PTA Distal 11                          monophasic                        +-----------+--------+-----+---------------+----------+-----------------------+ PERO Distal19                          monophasic                        +-----------+--------+-----+---------------+----------+-----------------------+ DFA Mid - 90 cm/s - monophasic DFA Dst - 51 cm/s - monophasic.  Summary: Right: 50-74% stenosis noted in the deep femoral artery. Total occlusion noted in the superficial femoral artery.  See table(s) above for measurements and observations. Electronically signed by Lonni Gaskins MD on 01/19/2024 at 12:31:40 PM.    Final    VAS US  ABI WITH/WO TBI Result Date: 01/19/2024  LOWER EXTREMITY DOPPLER STUDY Patient Name:  CAYDENCE ENCK  Date of Exam:   01/19/2024 Medical Rec #: [id number]       Accession #:    [id number] Date of Birth: [birth year]       Patient Gender: M Patient Age:   89 years Exam Location:  [address] Procedure:      VAS US  ABI WITH/WO TBI Referring Phys: [facility] --------------------------------------------------------------------------------  Indications: Peripheral artery disease. High Risk Factors: Hypertension, hyperlipidemia, current smoker.  Vascular Interventions: Successful diamondback orbital rotation                         arthrectomy, PT                         and covered stent at a subtotal calcified proximal                         right                         common iliac artery on 04/27/2017. Angioplasty and                         stent                         of left common and external iliac artery, and left                         common femoral endarterectomy including                         profundoplasty                         and endarterectomy the proximal SFA with bovine  pericardial patch angioplasty on 09/02/2023. Comparison Study: 10/13/23  Performing Technologist: Garnette Rockers  Examination Guidelines: A complete evaluation includes at minimum, Doppler waveform signals and systolic blood pressure reading at the level of bilateral brachial, anterior tibial, and posterior tibial arteries, when vessel segments are accessible. Bilateral testing is considered an integral part of a complete examination. Photoelectric Plethysmograph (PPG) waveforms and toe systolic pressure readings are included as required and additional duplex testing as needed. Limited examinations for reoccurring indications may be performed as noted.  ABI Findings: +---------+------------------+-----+-------------------+--------+ Right    Rt Pressure (mmHg)IndexWaveform           Comment  +---------+------------------+-----+-------------------+--------+ Brachial 139                                                +---------+------------------+-----+-------------------+--------+ PTA      53                0.38 dampened monophasic         +---------+------------------+-----+-------------------+--------+ DP       70                0.50 dampened monophasic         +---------+------------------+-----+-------------------+--------+ Great Toe0                 0.00 Absent                      +---------+------------------+-----+-------------------+--------+ +---------+------------------+-----+----------+-------+ Left     Lt Pressure (mmHg)IndexWaveform  Comment +---------+------------------+-----+----------+-------+ Brachial 139                                      +---------+------------------+-----+----------+-------+ PTA      75                0.54 monophasic        +---------+------------------+-----+----------+-------+ DP       66                0.47 monophasic        +---------+------------------+-----+----------+-------+ Great Toe34                0.24 Abnormal          +---------+------------------+-----+----------+-------+  +-------+-----------+-----------+------------+------------+ ABI/TBIToday's ABIToday's TBIPrevious ABIPrevious TBI +-------+-----------+-----------+------------+------------+ Right  0.5        0.0        0.28        0.0          +-------+-----------+-----------+------------+------------+ Left   0.54       0.24       0.64        0.3          +-------+-----------+-----------+------------+------------+  Summary: Right: Resting right ankle-brachial index indicates moderate right lower extremity arterial disease. The right toe-brachial index is abnormal. Left: Resting left ankle-brachial index indicates moderate left lower extremity arterial disease. The left toe-brachial index is abnormal. *See table(s) above for measurements and observations.  Electronically signed by Lonni Gaskins MD on 01/19/2024 at 11:19:29 AM.    Final     There are no new results to review at this time.  Previous records (including but not limited to H&P, progress notes, nursing notes, TOC management) were reviewed in assessment of this patient.  Labs: CBC: Recent  Labs  Lab 02/03/24 1548 02/03/24 2044 02/03/24 2339 02/05/24 0841  WBC 21.7* 19.6* 18.0* 15.4*  NEUTROABS 19.3*  --   --   --   HGB 11.3* 9.9* 8.7* 9.2*  HCT 35.7* 32.2* 26.8* 28.7*  MCV 81.3 81.5 81.0 81.5  PLT 354 304 284 258   Basic Metabolic Panel: Recent Labs  Lab 02/03/24 1548 02/03/24 1723 02/03/24 1812 02/03/24 2044 02/03/24 2339  NA 140  --  137  --  139  K 3.7  --  3.7  --  3.2*  CL 98  --  99  --  104  CO2 29  --  30  --  27  GLUCOSE 169*  --  127*  --  124*  BUN 35*  --  32*  --  28*  CREATININE 1.15  --  0.91 1.00 0.91  CALCIUM  >15.0*  --  13.3*  --  12.4*  PHOS  --  2.5  --   --   --    Liver Function Tests: Recent Labs  Lab 02/03/24 1548 02/03/24 2339  AST 22 19  ALT 12 12  ALKPHOS 116 70  BILITOT 0.6 0.5  PROT 7.4 5.4*  ALBUMIN  3.7 2.2*   CBG: No results for input(s): GLUCAP in the last 168  hours.  Scheduled Meds:  budesonide -glycopyrrolate -formoterol   2 puff Inhalation BID   cholecalciferol   10,000 Units Oral QPM   dofetilide   500 mcg Oral BID   gabapentin   300 mg Oral TID   polyethylene glycol  17 g Oral Daily   pyridOXINE   100 mg Oral QPM   Continuous Infusions:  sodium chloride      sodium chloride  125 mL/hr at 02/05/24 0333   cefTRIAXone  (ROCEPHIN )  IV 1 g (02/04/24 1507)   doxycycline  (VIBRAMYCIN ) IV 100 mg (02/05/24 0333)   heparin  1,150 Units/hr (02/04/24 2307)   PRN Meds:.albuterol , bisacodyl , HYDROcodone -acetaminophen , ketoconazole , sodium chloride  HYPERTONIC, triamcinolone  cream  Family Communication: At bedside  Disposition: Status is: Inpatient Remains inpatient appropriate because: See above     Time spent: 36 minutes  Length of inpatient stay: 2 days  Author: Carliss LELON Canales, DO 02/05/2024 10:55 AM  For on call review [email or web address].

## 2024-02-05 NOTE — NC FL2 (Addendum)
 [place]  MEDICAID FL2 LEVEL OF CARE FORM     IDENTIFICATION  Patient Name: Bradley Ferguson Birthdate: [birth year] Sex: male Admission Date (Current Location): 02/03/2024  [facility] and [place] Number:  Producer, television/film/video and Address:  [facility], [address] [place], [place] [address]      Provider Number: [id number]  Attending Physician Name and Address:  Arlon Carliss ORN, DO  Relative Name and Phone Number:       Current Level of Care: Hospital Recommended Level of Care: Skilled Nursing Facility Prior Approval Number:    Date Approved/Denied:  02/08/2024 - 03/09/2024 PASRR Number: [id number]   Discharge Plan: SNF    Current Diagnoses: Patient Active Problem List   Diagnosis Date Noted   Severe sepsis (HCC) 02/03/2024   Hypercalcemia 02/03/2024   Multifocal lung consolidation (HCC) 01/12/2024   Critical limb ischemia of right lower extremity (HCC) 11/09/2023   Atherosclerosis of lower extremity with claudication (HCC) 11/09/2023   Aortoiliac occlusive disease (HCC) 09/02/2023   Critical limb ischemia of left lower extremity (HCC) 07/28/2023   Elevated coronary artery calcium  score 07/13/2023   Hepatic steatosis 07/05/2020   Corn of foot 06/12/2020   Elevated LFTs 06/12/2020   Essential hypertension 04/29/2020   Right inguinal hernia 03/19/2020   TB lung, latent 12/19/2019   Systemic scleroses (HCC) 12/19/2019   Centrilobular emphysema (HCC) 11/24/2019   Interstitial pulmonary disease (HCC) 11/24/2019   Positive ANA (antinuclear antibody) 11/24/2019   Atherosclerosis of aorta (HCC) by Chest CT scan on 01/19/2020 06/30/2019   Chronic obstructive pulmonary disease (HCC) 06/30/2019   Acquired thrombophilia (HCC) 05/10/2019   Colon polyps 12/06/2018   Visit for monitoring Tikosyn  therapy 04/27/2018   Peripheral arterial disease (HCC) 04/16/2017   Labile hypertension 02/29/2016   BPH 02/29/2016   Mixed hyperlipidemia 11/12/2015    Vitamin D  deficiency 11/12/2015   Bipolar I disorder (HCC) 08/13/2015   Atrial fibrillation (HCC) 08/13/2015   Attention deficit disorder 08/08/2010   Dental caries 08/08/2010   ANXIETY DEPRESSION 06/06/2010   TOBACCO ABUSE 06/06/2010   RAYNAUDS SYNDROME 06/06/2010    Orientation RESPIRATION BLADDER Height & Weight     Self, Time, Situation, Place  Normal Continent Weight: 151 lb 14.4 oz (68.9 kg) Height:  6' 0.99 (185.4 cm)  BEHAVIORAL SYMPTOMS/MOOD NEUROLOGICAL BOWEL NUTRITION STATUS      Continent Diet (See discharge summary)  AMBULATORY STATUS COMMUNICATION OF NEEDS Skin   Extensive Assist Verbally Normal                       Personal Care Assistance Level of Assistance  Bathing, Feeding, Dressing Bathing Assistance: Limited assistance Feeding assistance: Limited assistance Dressing Assistance: Limited assistance     Functional Limitations Info  Sight, Hearing, Speech Sight Info: Impaired Financial trader) Hearing Info: Impaired Speech Info: Adequate    SPECIAL CARE FACTORS FREQUENCY  PT (By licensed PT), OT (By licensed OT)     PT Frequency: 5x week OT Frequency: 5x week            Contractures Contractures Info: Not present    Additional Factors Info  Code Status, Allergies, Isolation Precautions, Psychotropic Code Status Info: Full Allergies Info: Cymbalta  (Duloxetine  Hcl), Effexor (Venlafaxine), Wellbutrin  (Bupropion ), Zoloft (Sertraline Hcl) Psychotropic Info: gabapentin    Isolation Precautions Info: Air/Con pre     Current Medications (02/05/2024):  This is the current hospital active medication list Current Facility-Administered Medications  Medication Dose Route Frequency Provider Last Rate Last  Admin   0.9 %  sodium chloride  infusion   Intravenous Once Zackowski, Scott, MD       0.9 %  sodium chloride  infusion   Intravenous Continuous Sim Emery CROME, MD 125 mL/hr at 02/05/24 0333 New Bag at 02/05/24 0333   albuterol  (PROVENTIL ) (2.5 MG/3ML)  0.083% nebulizer solution 2.5 mg  2.5 mg Nebulization Q6H PRN Garba, Mohammad L, MD       bisacodyl  (DULCOLAX) EC tablet 5 mg  5 mg Oral Daily PRN Arlon Honey W, DO       budesonide -glycopyrrolate -formoterol  (BREZTRI ) 160-9-4.8 MCG/ACT inhaler 2 puff  2 puff Inhalation BID Garba, Mohammad L, MD   2 puff at 02/05/24 1155   cefTRIAXone  (ROCEPHIN ) 1 g in sodium chloride  0.9 % 100 mL IVPB  1 g Intravenous Q24H Sim Emery L, MD 200 mL/hr at 02/05/24 1605 1 g at 02/05/24 1605   cholecalciferol  (VITAMIN D3) 25 MCG (1000 UNIT) tablet 10,000 Units  10,000 Units Oral QPM Sim Emery CROME, MD   10,000 Units at 02/04/24 1732   dofetilide  (TIKOSYN ) capsule 500 mcg  500 mcg Oral BID Sim Emery CROME, MD   500 mcg at 02/05/24 1049   doxycycline  (VIBRA -TABS) tablet 100 mg  100 mg Oral Q12H Paytes, Emma U, RPH       gabapentin  (NEURONTIN ) capsule 300 mg  300 mg Oral TID Sim Emery L, MD   300 mg at 02/05/24 1607   heparin  ADULT infusion 100 units/mL (25000 units/250mL)  1,300 Units/hr Intravenous Continuous Lola Maurilio PENNER, RPH 13 mL/hr at 02/05/24 1415 1,300 Units/hr at 02/05/24 1415   HYDROcodone -acetaminophen  (NORCO/VICODIN) 5-325 MG per tablet 1 tablet  1 tablet Oral Q4H PRN Sim Emery CROME, MD   1 tablet at 02/05/24 1425   ketoconazole  (NIZORAL ) 2 % cream 1 Application  1 Application Topical Daily PRN Garba, Mohammad L, MD       polyethylene glycol (MIRALAX  / GLYCOLAX ) packet 17 g  17 g Oral Daily Arlon Honey W, DO   17 g at 02/05/24 1047   pyridOXINE  (VITAMIN B6) tablet 100 mg  100 mg Oral QPM Sim Emery L, MD   100 mg at 02/04/24 1732   sodium chloride  HYPERTONIC 3 % nebulizer solution 4 mL  4 mL Nebulization PRN Arlon Honey ORN, DO   4 mL at 02/04/24 2052   triamcinolone  cream (KENALOG ) 0.5 % 1 Application  1 Application Topical Daily PRN Garba, Mohammad L, MD         Discharge Medications: Please see discharge summary for a list of discharge medications.  Relevant Imaging  Results:  Relevant Lab Results:   Additional Information SSN [id number]  [name], LCSWA

## 2024-02-05 NOTE — Evaluation (Signed)
 Physical Therapy Evaluation Patient Details Name: Bradley Ferguson MRN: [id number] DOB: 08/05/1950 Today's Date: 02/05/2024  History of Present Illness  73 y.o. male admitted 02/03/24 for near syncope at pulmonologist appt. 8/1 CT with increase in size of dense masslike consolidation & pleural effusion. PMH: COPD, ILD, Afib, HTN, PAD s/p bil fem endarerectomy, HLD, raynaud's syndrome, latent tuberculosis, tobacco abuse  Clinical Impression  Pt pleasant, reports fatigue and decreased activity tolerance for a few weeks with declining ability to care for himself. Pt lives in boarding house with roommates, difficulty with mobility, aDLs and IADLs. Pt with desaturation with activity requiring 1.5L with activity for supine to sit, then able to tolerate RA for standing and up to chair with SPO2 91%. Pt educated for activity progression and transfers and will benefit from acute therapy to maximize mobility, safety and function to decrease burden of care. Patient will benefit from continued inpatient follow up therapy, <3 hours/day         If plan is discharge home, recommend the following: A little help with walking and/or transfers;A little help with bathing/dressing/bathroom;Assistance with cooking/housework;Assist for transportation;Help with stairs or ramp for entrance   Can travel by private vehicle   Yes    Equipment Recommendations None recommended by PT  Recommendations for Other Services  OT consult    Functional Status Assessment Patient has had a recent decline in their functional status and demonstrates the ability to make significant improvements in function in a reasonable and predictable amount of time.     Precautions / Restrictions Precautions Precautions: Fall;Other (comment) Recall of Precautions/Restrictions: Impaired Precaution/Restrictions Comments: watch sats      Mobility  Bed Mobility Overal bed mobility: Needs Assistance Bed Mobility: Supine to Sit     Supine to  sit: Contact guard, HOB elevated, Used rails     General bed mobility comments: CGA for lines and safety with initial desaturation to 88% with transition to EOB and 1.5L O2 applied with pt able to achieve 93%    Transfers Overall transfer level: Needs assistance   Transfers: Sit to/from Stand, Bed to chair/wheelchair/BSC Sit to Stand: Min assist Stand pivot transfers: Contact guard assist         General transfer comment: min assist to rise from bed with cues for hand placement, safety, sequence. Pt performed stand pivot with RW bed to chair CGA. Repeated sit to stands from recliner x 5 in 60 sec with cues for hand placement CGA with difficulty achieving fully upright 50% of the time    Ambulation/Gait               General Gait Details: pt denied attempting  Stairs            Wheelchair Mobility     Tilt Bed    Modified Rankin (Stroke Patients Only)       Balance Overall balance assessment: Needs assistance, History of Falls Sitting-balance support: No upper extremity supported, Feet supported Sitting balance-Leahy Scale: Good     Standing balance support: Bilateral upper extremity supported, Reliant on assistive device for balance, During functional activity Standing balance-Leahy Scale: Poor Standing balance comment: RW in standing                             Pertinent Vitals/Pain Pain Assessment Pain Assessment: 0-10 Pain Score: 3  Pain Location: back Pain Descriptors / Indicators: Aching Pain Intervention(s): Limited activity within patient's tolerance    Home  Living Family/patient expects to be discharged to:: Private residence Living Arrangements: Non-relatives/Friends Available Help at Discharge: Available PRN/intermittently Type of Home: House Home Access: Stairs to enter   [employer] of Steps: 2   Home Layout: One level;Laundry or work area in [employer]: Rollator (4 wheels);Cane - single point       Prior Function               Mobility Comments: walks with rollator with decreased distance and falling lately ADLs Comments: doing the best I could, pt difficulty standing for long periods states he would kneel in shower to wash hair but stands for rest of body, gets friends to pick up food and he microwaves for cooking     Extremity/Trunk Assessment   Upper Extremity Assessment Upper Extremity Assessment: Generalized weakness    Lower Extremity Assessment Lower Extremity Assessment: Generalized weakness    Cervical / Trunk Assessment Cervical / Trunk Assessment: Kyphotic  Communication   Communication Communication: No apparent difficulties    Cognition Arousal: Alert Behavior During Therapy: Flat affect   PT - Cognitive impairments: No family/caregiver present to determine baseline, Problem solving, Safety/Judgement                         Following commands: Intact       Cueing Cueing Techniques: Verbal cues, Gestural cues     General Comments      Exercises     Assessment/Plan    PT Assessment Patient needs continued PT services  PT Problem List Decreased strength;Decreased activity tolerance;Decreased mobility;Decreased balance;Decreased knowledge of use of DME       PT Treatment Interventions DME instruction;Gait training;Functional mobility training;Therapeutic activities;Therapeutic exercise;Patient/family education;Balance training    PT Goals (Current goals can be found in the Care Plan section)  Acute Rehab PT Goals Patient Stated Goal: be able to walk PT Goal Formulation: With patient Time For Goal Achievement: 02/19/24 Potential to Achieve Goals: Good    Frequency Min 2X/week     Co-evaluation               AM-PAC PT 6 Clicks Mobility  Outcome Measure Help needed turning from your back to your side while in a flat bed without using bedrails?: A Little Help needed moving from lying on your back to sitting on  the side of a flat bed without using bedrails?: A Little Help needed moving to and from a bed to a chair (including a wheelchair)?: A Little Help needed standing up from a chair using your arms (e.g., wheelchair or bedside chair)?: A Little Help needed to walk in hospital room?: A Lot Help needed climbing 3-5 steps with a railing? : Total 6 Click Score: 15    End of Session Equipment Utilized During Treatment: Gait belt Activity Tolerance: Patient tolerated treatment well Patient left: in chair;with call bell/phone within reach;with chair alarm set Nurse Communication: Mobility status PT Visit Diagnosis: Other abnormalities of gait and mobility (R26.89);Difficulty in walking, not elsewhere classified (R26.2);Muscle weakness (generalized) (M62.81)    Time: 8950-8876 PT Time Calculation (min) (ACUTE ONLY): 34 min   Charges:   PT Evaluation $PT Eval Moderate Complexity: 1 Mod PT Treatments $Therapeutic Activity: 8-22 mins PT General Charges $$ ACUTE PT VISIT: 1 Visit         Lenoard SQUIBB, PT Acute Rehabilitation Services Office: [phone]   Lenoard NOVAK Marquie Aderhold 02/05/2024, 12:40 PM

## 2024-02-05 NOTE — Evaluation (Signed)
 Occupational Therapy Evaluation Patient Details Name: Bradley Ferguson MRN: [id number] DOB: 1951-06-09 Today's Date: 02/05/2024   History of Present Illness   73 y.o. male admitted 02/03/24 for near syncope at pulmonologist appt. 8/1 CT with increase in size of dense masslike consolidation & pleural effusion. PMH: COPD, ILD, Afib, HTN, PAD s/p bil fem endarerectomy, HLD, raynaud's syndrome, latent tuberculosis, tobacco abuse     Clinical Impressions Pt admitted based on above, and was seen based on problem list below. PTA pt was mod I with ADLs and IADLs, but reporting increased fatigue and effort with tasks. Today pt is requiring set up  to min  assist for ADLs. Bed mobility was s for safety and STS are  CGA with use of RW. Pt declining further mobility or transfers d/t fatigue. Pt with decreased problem solving, organization, and poor judgement. Pt with poor standing tolerance and balance limiting performance in ADLs. Pt would benefit from <3 hours of skilled rehab daily prior to d/c home to improve deficits. OT will continue to follow acutely to maximize functional independence.        If plan is discharge home, recommend the following:   A little help with walking and/or transfers;A little help with bathing/dressing/bathroom;Direct supervision/assist for medications management;Assist for transportation     Functional Status Assessment   Patient has had a recent decline in their functional status and demonstrates the ability to make significant improvements in function in a reasonable and predictable amount of time.     Equipment Recommendations   Other (comment) (Defer to next venue)     Recommendations for Other Services         Precautions/Restrictions   Precautions Precautions: Fall;Other (comment) Recall of Precautions/Restrictions: Impaired Precaution/Restrictions Comments: watch sats Restrictions Weight Bearing Restrictions Per Provider Order: No      Mobility Bed Mobility Overal bed mobility: Needs Assistance Bed Mobility: Supine to Sit, Sit to Supine     Supine to sit: Supervision, HOB elevated Sit to supine: Supervision, HOB elevated   General bed mobility comments: S for safety    Transfers Overall transfer level: Needs assistance Equipment used: Rolling walker (2 wheels) Transfers: Sit to/from Stand Sit to Stand: Contact guard assist           General transfer comment: CGA for STS, pt able to take small steps in place, but declining transfer and further mobility at this time      Balance Overall balance assessment: Needs assistance, History of Falls Sitting-balance support: No upper extremity supported, Feet supported Sitting balance-Leahy Scale: Good     Standing balance support: Bilateral upper extremity supported, Reliant on assistive device for balance, During functional activity Standing balance-Leahy Scale: Poor Standing balance comment: RW in standing       ADL either performed or assessed with clinical judgement   ADL Overall ADL's : Needs assistance/impaired Eating/Feeding: Set up;Sitting   Grooming: Set up;Sitting   Upper Body Bathing: Set up;Sitting   Lower Body Bathing: Minimal assistance;Sit to/from stand   Upper Body Dressing : Set up;Sitting   Lower Body Dressing: Minimal assistance;Sit to/from stand Lower Body Dressing Details (indicate cue type and reason): Can figure 4 legs, min assist to steady in standing Toilet Transfer: Stand-pivot;Rolling walker (2 wheels);Contact guard assist Toilet Transfer Details (indicate cue type and reason): Simulated in room Toileting- Clothing Manipulation and Hygiene: Minimal assistance;Sit to/from stand Toileting - Clothing Manipulation Details (indicate cue type and reason): Assist to steady in standing     Functional mobility during  ADLs: Contact guard assist;Rolling walker (2 wheels) General ADL Comments: Pt with limited standing balance and  tolerance     Vision Baseline Vision/History: 1 Wears glasses Vision Assessment?: No apparent visual deficits            Pertinent Vitals/Pain Pain Assessment Pain Assessment: No/denies pain     Extremity/Trunk Assessment Upper Extremity Assessment Upper Extremity Assessment: Generalized weakness   Lower Extremity Assessment Lower Extremity Assessment: Defer to PT evaluation   Cervical / Trunk Assessment Cervical / Trunk Assessment: Kyphotic   Communication Communication Communication: No apparent difficulties   Cognition Arousal: Alert Behavior During Therapy: Flat affect Cognition: Cognition impaired     Awareness: Online awareness impaired     Executive functioning impairment (select all impairments): Problem solving, Organization OT - Cognition Comments: Pt often tangental, easily fixates on events, poor safety awareness and judgement       Following commands: Intact       Cueing  General Comments   Cueing Techniques: Verbal cues;Gestural cues  RN present for second half of session, VSS on RA           Home Living Family/patient expects to be discharged to:: Private residence Living Arrangements: Non-relatives/Friends Available Help at Discharge: Available PRN/intermittently Type of Home: House Home Access: Stairs to enter [employer] of Steps: 2   Home Layout: One level;Laundry or work area in [employer] Shower/Tub: Tub/shower unit;Door   Bathroom Toilet: Standard Bathroom Accessibility: No   Home Equipment: Rollator (4 wheels);Cane - single point          Prior Functioning/Environment Prior Level of Function : Independent/Modified Independent;Driving     Mobility Comments: walks with rollator with decreased distance and falling lately ADLs Comments: pt tangiental reporting he was managing ADLs and IADLs with increased effort    OT Problem List: Decreased strength;Decreased range of motion;Decreased activity  tolerance;Impaired balance (sitting and/or standing);Decreased coordination;Decreased cognition;Decreased safety awareness;Cardiopulmonary status limiting activity   OT Treatment/Interventions: Self-care/ADL training;Therapeutic exercise;Energy conservation;Therapeutic activities;Cognitive remediation/compensation;Patient/family education;Balance training      OT Goals(Current goals can be found in the care plan section)   Acute Rehab OT Goals Patient Stated Goal: To get rest OT Goal Formulation: With patient Time For Goal Achievement: 02/19/24 Potential to Achieve Goals: Good   OT Frequency:  Min 2X/week       AM-PAC OT 6 Clicks Daily Activity     Outcome Measure Help from another person eating meals?: None Help from another person taking care of personal grooming?: A Little Help from another person toileting, which includes using toliet, bedpan, or urinal?: A Little Help from another person bathing (including washing, rinsing, drying)?: A Little Help from another person to put on and taking off regular upper body clothing?: A Little Help from another person to put on and taking off regular lower body clothing?: A Little 6 Click Score: 19   End of Session Equipment Utilized During Treatment: Rolling walker (2 wheels) Nurse Communication: Mobility status  Activity Tolerance: Patient tolerated treatment well Patient left: in bed;with call bell/phone within reach;with bed alarm set  OT Visit Diagnosis: Unsteadiness on feet (R26.81);Other abnormalities of gait and mobility (R26.89);Repeated falls (R29.6);Muscle weakness (generalized) (M62.81);History of falling (Z91.81)                Time: 8665-8641 OT Time Calculation (min): 24 min Charges:  OT General Charges $OT Visit: 1 Visit OT Evaluation $OT Eval Moderate Complexity: 1 Mod OT Treatments $Self Care/Home Management : 8-22  mins  Adrianne BROCKS, OT  Acute Rehabilitation Services Office [phone] Secure chat  preferred   [name] [employer] 02/05/2024, 2:27 PM

## 2024-02-05 NOTE — Progress Notes (Signed)
 PHARMACY - ANTICOAGULATION  Pharmacy Consult for heparin  Indication: atrial fibrillation Brief A/P: Heparin  level within goal range Continue Heparin  at current rate   Allergies  Allergen Reactions   Cymbalta  [Duloxetine  Hcl]     unknown   Effexor [Venlafaxine]     Feels bad   Wellbutrin  [Bupropion ]     sleepy   Zoloft [Sertraline Hcl]     Felt bad    Patient Measurements: Height: 6' 0.99 (185.4 cm) Weight: 68.9 kg (151 lb 14.4 oz) IBW/kg (Calculated) : 79.88 HEPARIN  DW (KG): 65.2  Vital Signs: Temp: 98.5 F (36.9 C) (08/08 2332) Temp Source: Oral (08/08 2332) BP: 138/80 (08/08 2332) Pulse Rate: 88 (08/08 2332)  Labs: Recent Labs    02/03/24 2039 02/03/24 2044 02/03/24 2339 02/04/24 2136 02/04/24 2136 02/05/24 0841 02/05/24 1036 02/05/24 2229  HGB  --  9.9* 8.7*  --   --  9.2*  --   --   HCT  --  32.2* 26.8*  --   --  28.7*  --   --   PLT  --  304 284  --   --  258  --   --   APTT  --   --   --  42*  --   --   --   --   HEPARINUNFRC  --   --   --  0.10*   < > 0.28* 0.26* 0.32  CREATININE  --  1.00 0.91  --   --  0.82  --   --   TROPONINIHS 22*  --  19*  --   --   --   --   --    < > = values in this interval not displayed.    Estimated Creatinine Clearance: 79.4 mL/min (by C-G formula based on SCr of 0.82 mg/dL).   Assessment: 73 y.o. male with h/o Afib, Xarelto  on hold, for heparin   Goal of Therapy:  Heparin  level 0.3-0.7 units/ml Monitor platelets by anticoagulation protocol: Yes   Plan:  No change to heparin  Follow-up am labs.   Cathlyn Arrant, PharmD, BCPS  02/05/2024 11:52 PM

## 2024-02-05 NOTE — TOC Initial Note (Addendum)
 Transition of Care [facility]) - Initial/Assessment Note    Patient Details  Name: Bradley Ferguson MRN: [id number] Date of Birth: [birth year]  [facility]) CM/SW Contact:    Bradley Ferguson, LCSWA Phone Number: 02/05/2024, 3:29 PM  Clinical Narrative: CSW spoke to patient about PT recs for SNF. At this time, patient is agreeable. CSW to complete SNF workup.   CSW addressed SDOH needs for food and social connections. Patient stated that at this time he has enough food at home and he has some friends he speak with and someone that comes to check on him at home.   4:31 PM PASRR is currently pending. CSW uploaded requested clinicals to [facility]. Awaiting PASRR number.   CSW will continue to follow.                  Expected Discharge Plan: Skilled Nursing Facility Barriers to Discharge: Continued Medical Work up, SNF Pending bed offer, Insurance Authorization   Patient Goals and CMS Choice Patient states their goals for this hospitalization and ongoing recovery are:: To go to rehab          Expected Discharge Plan and Services In-house Referral: Clinical Social Work     Living arrangements for the past 2 months: Single Family Home                                      Prior Living Arrangements/Services Living arrangements for the past 2 months: Single Family Home Lives with:: Self Patient language and need for interpreter reviewed:: Yes Do you feel safe going back to the place where you live?: Yes      Need for Family Participation in Patient Care: Yes (Comment) Care giver support system in place?: Yes (comment) (Patient stated he has someone that comes to check on him) Current home services: DME (rollator) Criminal Activity/Legal Involvement Pertinent to Current Situation/Hospitalization: No - Comment as needed  Activities of Daily Living   ADL Screening (condition at time of admission) Independently performs ADLs?: No Does the patient have a NEW difficulty  with bathing/dressing/toileting/self-feeding that is expected to last >3 days?: Yes (Initiates electronic notice to provider for possible OT consult) Does the patient have a NEW difficulty with getting in/out of bed, walking, or climbing stairs that is expected to last >3 days?: Yes (Initiates electronic notice to provider for possible PT consult) Does the patient have a NEW difficulty with communication that is expected to last >3 days?: No Is the patient deaf or have difficulty hearing?: No Does the patient have difficulty seeing, even when wearing glasses/contacts?: No Does the patient have difficulty concentrating, remembering, or making decisions?: No  Permission Sought/Granted Permission sought to share information with : Facility Industrial/product designer granted to share information with : Yes, Verbal Permission Granted     Permission granted to share info w AGENCY: SNFs        Emotional Assessment Appearance:: Appears stated age Attitude/Demeanor/Rapport: Engaged Affect (typically observed): Stable Orientation: : Oriented to Situation, Oriented to  Time, Oriented to Place, Oriented to Self Alcohol / Substance Use: Not Applicable Psych Involvement: No (comment)  Admission diagnosis:  Hypercalcemia [E83.52] Severe sepsis (HCC) [A41.9, R65.20] Mass of left lung [R91.8] Multifocal pneumonia [J18.9] Sepsis, due to unspecified organism, unspecified whether acute organ dysfunction present [facility]) [A41.9] Patient Active Problem List   Diagnosis Date Noted   Severe sepsis (HCC) 02/03/2024  Hypercalcemia 02/03/2024   Multifocal lung consolidation (HCC) 01/12/2024   Critical limb ischemia of right lower extremity (HCC) 11/09/2023   Atherosclerosis of lower extremity with claudication (HCC) 11/09/2023   Aortoiliac occlusive disease (HCC) 09/02/2023   Critical limb ischemia of left lower extremity (HCC) 07/28/2023   Elevated coronary artery calcium  score 07/13/2023   Hepatic  steatosis 07/05/2020   Corn of foot 06/12/2020   Elevated LFTs 06/12/2020   Essential hypertension 04/29/2020   Right inguinal hernia 03/19/2020   TB lung, latent 12/19/2019   Systemic scleroses (HCC) 12/19/2019   Centrilobular emphysema (HCC) 11/24/2019   Interstitial pulmonary disease (HCC) 11/24/2019   Positive ANA (antinuclear antibody) 11/24/2019   Atherosclerosis of aorta (HCC) by Chest CT scan on 01/19/2020 06/30/2019   Chronic obstructive pulmonary disease (HCC) 06/30/2019   Acquired thrombophilia (HCC) 05/10/2019   Colon polyps 12/06/2018   Visit for monitoring Tikosyn  therapy 04/27/2018   Peripheral arterial disease (HCC) 04/16/2017   Labile hypertension 02/29/2016   BPH 02/29/2016   Mixed hyperlipidemia 11/12/2015   Vitamin D  deficiency 11/12/2015   Bipolar I disorder (HCC) 08/13/2015   Atrial fibrillation (HCC) 08/13/2015   Attention deficit disorder 08/08/2010   Dental caries 08/08/2010   ANXIETY DEPRESSION 06/06/2010   TOBACCO ABUSE 06/06/2010   RAYNAUDS SYNDROME 06/06/2010   PCP:  Sebastian Beverley NOVAK, MD Pharmacy:   [facility] [place], [place] - [address] [place] [address] [place] [address] Phone: [phone] Fax: [phone]  [id number] - [place], [place] - [address] [place] [address] Phone: [phone] Fax: [phone]     Social Drivers of Health (SDOH) Social History: SDOH Screenings   Food Insecurity: Food Insecurity Present (02/03/2024)  Housing: Low Risk  (02/03/2024)  Transportation Needs: No Transportation Needs (02/03/2024)  Utilities: Not At Risk (02/03/2024)  Alcohol Screen: Low Risk  (12/29/2023)  Depression (PHQ2-9): Medium Risk (11/13/2023)  Financial Resource Strain: Medium Risk (12/29/2023)  Physical Activity: Inactive (12/29/2023)  Social Connections: Socially Isolated (02/03/2024)  Stress: Stress Concern Present (12/29/2023)  Tobacco Use: High Risk (02/03/2024)   Health Literacy: Adequate Health Literacy (11/13/2023)   SDOH Interventions: Food Insecurity Interventions: Inpatient TOC, Patient Declined Social Connections Interventions: Inpatient TOC, Patient Declined   Readmission Risk Interventions    02/04/2024    3:23 PM 11/10/2023    2:54 PM  Readmission Risk Prevention Plan  Post Dischage Appt  Complete  Medication Screening  Complete  Transportation Screening Complete Complete  PCP or Specialist Appt within 5-7 Days Complete   Home Care Screening Complete   Medication Review (RN CM) Complete

## 2024-02-05 NOTE — Progress Notes (Signed)
 NAME:  Bradley Ferguson, MRN:  [id number], DOB:  1950/08/31, LOS: 2 ADMISSION DATE:  02/03/2024, CONSULTATION DATE:  02/05/24 REFERRING MD:  Dr Arlon , CHIEF COMPLAINT:  Lung mass like consolidation    History of Present Illness:  73 year old male with a past medical history of limited systemic sclerosis, COPD, ILD, A-fib, latent TB who is presenting to the hospital for further evaluation of syncopal episodes associated with tachycardia, dizziness, lightheadedness and unresolved pneumonia.   Per chart review, the patient was following with rheumatology in 2021 for his rheumatologic disease and was recommended to start Imuran however his QuantiFERON was positive and he was referred to ID for initiation of INH but he was lost to follow-up.   Patient reports that for the past month he has been coughing every day with episodes of hemoptysis where he would have mostly streaks of blood mixed with phlegm.  He also reports significant weight loss as he states he weighed around 85 kg last year and now weighs 65 kg.  He states that he has no appetite.  He denies any fevers, chills or night sweats.   He is a former smoker and quit 2 months ago.  He started smoking when he was 73 years old and smoked on average 1 pack/day.  Pertinent to his clinical presentation is that he has a positive family history of lung cancer.  He states that his mother died of lung cancer.   On review of his imaging, he had an initial CT in 2022 which showed evidence of peripheral basilar predominant pulmonary fibrosis that was characterized as probable UIP.  In January 2025 he had a CT cardiac scoring which showed a 6 mm right upper lobe noncalcified lung nodule and was recommended to get a repeat CT scan in 6 months.  Repeat CT scan on July 1 showing a large area of consolidation in the left upper lobe measured as 4.6 x 6 cm.  He was given antibiotics to cover for multifocal pneumonia with cefixime, azithromycin  and Augmentin  with  persistent symptoms despite antibiotics.  Repeat CT chest was performed on August 1 which showed worsening with slight increase in the size of his dense masslike consolidation with presence of air bronchograms as well as a pleural effusion versus pleural thickening noted.  He was then referred to the hospital for inpatient management of his worsening clinical status.   In the hospital here he was started on ceftriaxone  and azithromycin  to cover for pneumonia. He states that he has not had any episodes of hemoptysis today or yesterday.   Pertinent  Medical History  As mentioned above   Significant Hospital Events: Including procedures, antibiotic start and stop dates in addition to other pertinent events   Induced sputum samples pending  1 sample on 02/04/24 at 13:32 and one sample at 1600  Interim History / Subjective:  Patient states that he has been coughing but no hemoptysis, also complains of poor appetite.   Objective    Blood pressure 131/71, pulse (!) 130, temperature 98 F (36.7 C), temperature source Oral, resp. rate 20, height 6' 0.99 (1.854 m), weight 68.9 kg, SpO2 91%.        Intake/Output Summary (Last 24 hours) at 02/05/2024 1652 Last data filed at 02/05/2024 1410 Gross per 24 hour  Intake 1365.94 ml  Output 1150 ml  Net 215.94 ml   Filed Weights   02/03/24 2004 02/04/24 0434 02/05/24 0336  Weight: 65.2 kg 66.2 kg 68.9 kg    Examination:  Physical Exam Constitutional:      Appearance: He is ill-appearing.  HENT:     Head: Normocephalic and atraumatic.     Nose: Nose normal.     Mouth/Throat:     Mouth: Mucous membranes are moist.  Eyes:     Extraocular Movements: Extraocular movements intact.     Pupils: Pupils are equal, round, and reactive to light.  Cardiovascular:     Rate and Rhythm: Normal rate and regular rhythm.     Pulses: Normal pulses.     Heart sounds: Normal heart sounds.  Pulmonary:     Breath sounds: Rales present.  Abdominal:     General:  Abdomen is flat.     Palpations: Abdomen is soft.  Musculoskeletal:        General: Normal range of motion.  Skin:    General: Skin is warm.     Capillary Refill: Capillary refill takes less than 2 seconds.  Neurological:     General: No focal deficit present.     Mental Status: He is alert and oriented to person, place, and time.      Resolved problem list   Assessment and Plan  This is a 73 year old male with history of tobacco use, family history of lung cancer, COPD, ILD, presumed limited systemic sclerosis, latent TB who is presenting to the hospital with a large masslike consolidation concerning for an infectious process versus malignancy with postobstructive pneumonia.  Given his latent TB history concern for active pulmonary TB as well.  His masslike consolidation has grown significantly over 6 months which is unusual for lung cancer.  He has had significant weight loss over the past year.   -Continue CAP coverage with CTX and azithro  - Plan for induced sputum x 3 to rule out active TB, Per [place] policy, samples need to be 8-24 hours apart and one sample has to be collected in the morning before 8am - If TB ruled out negative and no bacterial organisms found on culture, it would be reasonable to consider bronchoscopy for further evaluation - If he has further episodes of significant hemoptysis, can consider starting TXA nebs 500 mg 3 times daily for 72 hours - Pulmonary will continue to follow    Best Practice (right click and Reselect all SmartList Selections daily)   Per primary   Labs   CBC: Recent Labs  Lab 02/03/24 1548 02/03/24 2044 02/03/24 2339 02/05/24 0841  WBC 21.7* 19.6* 18.0* 15.4*  NEUTROABS 19.3*  --   --   --   HGB 11.3* 9.9* 8.7* 9.2*  HCT 35.7* 32.2* 26.8* 28.7*  MCV 81.3 81.5 81.0 81.5  PLT 354 304 284 258    Basic Metabolic Panel: Recent Labs  Lab 02/03/24 1548 02/03/24 1723 02/03/24 1812 02/03/24 2044 02/03/24 2339  02/05/24 0841  NA 140  --  137  --  139 138  K 3.7  --  3.7  --  3.2* 3.5  CL 98  --  99  --  104 103  CO2 29  --  30  --  27 24  GLUCOSE 169*  --  127*  --  124* 90  BUN 35*  --  32*  --  28* 16  CREATININE 1.15  --  0.91 1.00 0.91 0.82  CALCIUM  >15.0*  --  13.3*  --  12.4* 12.8*  MG  --   --   --   --   --  1.6*  PHOS  --  2.5  --   --   --   --    GFR: Estimated Creatinine Clearance: 79.4 mL/min (by C-G formula based on SCr of 0.82 mg/dL). Recent Labs  Lab 02/03/24 1548 02/03/24 1550 02/03/24 1812 02/03/24 2044 02/03/24 2339 02/05/24 0841  WBC 21.7*  --   --  19.6* 18.0* 15.4*  LATICACIDVEN  --  3.2* 1.7  --   --   --     Liver Function Tests: Recent Labs  Lab 02/03/24 1548 02/03/24 2339  AST 22 19  ALT 12 12  ALKPHOS 116 70  BILITOT 0.6 0.5  PROT 7.4 5.4*  ALBUMIN  3.7 2.2*   Recent Labs  Lab 02/03/24 1723  LIPASE 12   No results for input(s): AMMONIA in the last 168 hours.  ABG    Component Value Date/Time   TCO2 27 04/28/2010 2029     Coagulation Profile: No results for input(s): INR, PROTIME in the last 168 hours.  Cardiac Enzymes: No results for input(s): CKTOTAL, CKMB, CKMBINDEX, TROPONINI in the last 168 hours.  HbA1C: Hgb A1c MFr Bld  Date/Time Value Ref Range Status  07/06/2023 02:33 PM 5.9 (H) <5.7 % of total Hgb Final    Comment:    For someone without known diabetes, a hemoglobin  A1c value between 5.7% and 6.4% is consistent with prediabetes and should be confirmed with a  follow-up test. . For someone with known diabetes, a value <7% indicates that their diabetes is well controlled. A1c targets should be individualized based on duration of diabetes, age, comorbid conditions, and other considerations. . This assay result is consistent with an increased risk of diabetes. . Currently, no consensus exists regarding use of hemoglobin A1c for diagnosis of diabetes for children. SABRA   12/25/2022 11:17 AM 6.0 (H)  <5.7 % of total Hgb Final    Comment:    For someone without known diabetes, a hemoglobin  A1c value between 5.7% and 6.4% is consistent with prediabetes and should be confirmed with a  follow-up test. . For someone with known diabetes, a value <7% indicates that their diabetes is well controlled. A1c targets should be individualized based on duration of diabetes, age, comorbid conditions, and other considerations. . This assay result is consistent with an increased risk of diabetes. . Currently, no consensus exists regarding use of hemoglobin A1c for diagnosis of diabetes for children. .     CBG: No results for input(s): GLUCAP in the last 168 hours.  Review of Systems:   As mentioned above   Past Medical History:  He,  has a past medical history of Anxiety, Atrial fibrillation (HCC), Blood transfusion without reported diagnosis, Cataract, Depression, Emphysema of lung (HCC), Heart murmur, Hyperlipidemia, Hypertension, PVD (peripheral vascular disease) (HCC), Raynaud's syndrome, Substance abuse (HCC), Systemic sclerosis (HCC), TB lung, latent (12/19/2019), and Transaminitis (06/18/2020).   Surgical History:   Past Surgical History:  Procedure Laterality Date   ABDOMINAL AORTOGRAM W/LOWER EXTREMITY Left 07/09/2023   Procedure: ABDOMINAL AORTOGRAM W/LOWER EXTREMITY;  Surgeon: Court Dorn PARAS, MD;  Location: [facility] INVASIVE CV LAB;  Service: Cardiovascular;  Laterality: Left;   ATRIAL FIBRILLATION ABLATION     CATARACT EXTRACTION Right    COLONOSCOPY     ENDARTERECTOMY FEMORAL Left 09/02/2023   Procedure: LEFT LOWER EXTREMITY ANGIOGRAM WITH LEFT ILIAC  STENTING;  Surgeon: Gretta Lonni PARAS, MD;  Location: [facility] OR;  Service: Vascular;  Laterality: Left;   ENDARTERECTOMY FEMORAL Right 11/09/2023   Procedure: ENDARTERECTOMY, FEMORAL TO PROFUNDA;  Surgeon:  Gretta Lonni PARAS, MD;  Location: [facility] OR;  Service: Vascular;  Laterality: Right;   EYE SURGERY     FEMORAL-POPLITEAL  BYPASS GRAFT Left 09/02/2023   Procedure: LEFT FEMORAL ENDARTARECTOMY WITH PATCH ANGIOPLASTY;  Surgeon: Gretta Lonni PARAS, MD;  Location: [facility] OR;  Service: Vascular;  Laterality: Left;   FRACTURE SURGERY Left 1976   HERNIA REPAIR  2023   INGUINAL HERNIA REPAIR Right 03/23/2020   Procedure: OPEN RIGHT INGUINAL HERNIA REPAIR WITH MESH;  Surgeon: Gladis Cough, MD;  Location: [facility] OR;  Service: General;  Laterality: Right;   INSERTION OF MESH Right 03/23/2020   Procedure: INSERTION OF MESH;  Surgeon: Gladis Cough, MD;  Location: [facility] OR;  Service: General;  Laterality: Right;   LOWER EXTREMITY INTERVENTION N/A 04/27/2017   Procedure: LOWER EXTREMITY INTERVENTION;  Surgeon: Court Dorn PARAS, MD;  Location: [facility];  Service: Cardiovascular;  Laterality: N/A;   PATCH ANGIOPLASTY Right 11/09/2023   Procedure: ANGIOPLASTY, USING PATCH GRAFT, USING XENOSURE 1X14CM;  Surgeon: Gretta Lonni PARAS, MD;  Location: [facility] OR;  Service: Vascular;  Laterality: Right;   PERIPHERAL VASCULAR ATHERECTOMY Right 04/27/2017   Procedure: PERIPHERAL VASCULAR ATHERECTOMY;  Surgeon: Court Dorn PARAS, MD;  Location: [facility] INV[facility] CV LAB;  Service: Cardiovascular;  Laterality: Right;  Iliac   PERIPHERAL VASCULAR INTERVENTION Right 04/27/2017   Procedure: PERIPHERAL VASCULAR INTERVENTION;  Surgeon: Court Dorn PARAS, MD;  Location: [facility]AS[facility] LAB;  Service: Cardiovascular;  Laterality: Right;  Iliac     Social History:   reports that he has been smoking cigarettes. He started smoking about 54 years ago. He has a 108.9 pack-year smoking history. He has never used smokeless tobacco. He reports current alcohol use of about 2.0 standard drinks of alcohol per week. He reports current drug use. Frequency: 1.00 time per week. Drug: Oxycodone .   Family History:  His family history includes Cancer in his father and mother. There is no history of Colon cancer, Rectal cancer, Heart disease, Pancreatic cancer, Stomach  cancer, or Esophageal cancer.   Allergies Allergies  Allergen Reactions   Cymbalta  [Duloxetine  Hcl]     unknown   Effexor [Venlafaxine]     Feels bad   Wellbutrin  [Bupropion ]     sleepy   Zoloft [Sertraline Hcl]     Felt bad     Home Medications  Prior to Admission medications   Medication Sig Start Date End Date Taking? Authorizing Provider  albuterol  (PROVENTIL ) (2.5 MG/3ML) 0.083% nebulizer solution Take 3 mLs (2.5 mg total) by nebulization every 6 (six) hours as needed for wheezing or shortness of breath. 01/12/24  Yes Sebastian Beverley NOVAK, MD  albuterol  (VENTOLIN  HFA) 108 [phone] Base) MCG/ACT inhaler Use  2 inhalations  15 minutes apart  every 4 hours  to rescue Asthma Attack 05/20/21  Yes Tonita Fallow, MD  budesonide -glycopyrrolate -formoterol  (BREZTRI  AEROSPHERE) 160-9-4.8 MCG/ACT AERO inhaler Inhale 2 puffs into the lungs in the morning and at bedtime. 01/21/24  Yes Tamea Dedra CROME, MD  Cholecalciferol  250 MCG (10000 UT) CAPS Take 10,000 Units by mouth every evening.   Yes [provider]  dofetilide  (TIKOSYN ) 500 MCG capsule TAKE 1 CAPSULE BY MOUTH 2 TIMES DAILY. 08/17/23  Yes Court Dorn PARAS, MD  gabapentin  (NEURONTIN ) 300 MG capsule Take 1 capsule 3 x /day for Neuropathy Pain Patient taking differently: Take 300 mg by mouth 3 (three) times daily. 08/14/23  Yes Webb, Padonda B, FNP  ketoconazole  (NIZORAL ) 2 % cream Apply 1 Application topically daily as needed  for irritation.   Yes [provider]  oxyCODONE  (OXY IR/ROXICODONE ) 5 MG immediate release tablet Take 5 mg by mouth every 4 (four) hours as needed for severe pain (pain score 7-10).   Yes [provider]  pyridOXINE  (VITAMIN B-6) 100 MG tablet Take 100 mg by mouth every evening.   Yes [provider]  triamcinolone  cream (KENALOG ) 0.5 % Apply 1 Application topically daily as needed (skin irritation).   Yes [provider]  atorvastatin  (LIPITOR ) 80 MG tablet Take 1 tablet Daily  for Cholesterol Patient taking differently: Take 80 mg by mouth daily. Take 1 tablet Daily for Cholesterol 08/13/23   Webb, Padonda B, FNP  rivaroxaban  (XARELTO ) 20 MG TABS tablet Take 1 tablet (20 mg total) by mouth daily with supper. Patient not taking: Reported on 02/03/2024 12/01/23   Elna Loud P, PA-C      I spent 34 minutes caring for this patient today, including preparing to see the patient, obtaining a medical history , reviewing a separately obtained history, performing a medically appropriate examination and/or evaluation, counseling and educating the patient/family/caregiver, ordering medications, tests, or procedures, referring and communicating with other health care professionals (not separately reported), documenting clinical information in the electronic health record, independently interpreting results (not separately reported/billed) and communicating results to the patient/family/caregiver, and care coordination (not separately reported/billed)

## 2024-02-05 NOTE — Progress Notes (Signed)
 PHARMACY - ANTICOAGULATION CONSULT NOTE  Pharmacy Consult for heparin  Indication: atrial fibrillation  Allergies  Allergen Reactions   Cymbalta  [Duloxetine  Hcl]     unknown   Effexor [Venlafaxine]     Feels bad   Wellbutrin  [Bupropion ]     sleepy   Zoloft [Sertraline Hcl]     Felt bad    Patient Measurements: Height: 6' 0.99 (185.4 cm) Weight: 68.9 kg (151 lb 14.4 oz) IBW/kg (Calculated) : 79.88 HEPARIN  DW (KG): 65.2  Vital Signs: Temp: 98 F (36.7 C) (08/08 0836) Temp Source: Oral (08/08 0836) BP: 141/86 (08/08 0836) Pulse Rate: 79 (08/08 0836)  Labs: Recent Labs    02/03/24 2039 02/03/24 2044 02/03/24 2339 02/04/24 2136 02/05/24 0841 02/05/24 1036  HGB  --  9.9* 8.7*  --  9.2*  --   HCT  --  32.2* 26.8*  --  28.7*  --   PLT  --  304 284  --  258  --   APTT  --   --   --  42*  --   --   HEPARINUNFRC  --   --   --  0.10* 0.28* 0.26*  CREATININE  --  1.00 0.91  --  0.82  --   TROPONINIHS 22*  --  19*  --   --   --     Estimated Creatinine Clearance: 79.4 mL/min (by C-G formula based on SCr of 0.82 mg/dL).   Assessment: 73y.o. male admitted for lung consolidations on chest CT with accompanying hemoptysis. Has a hx of afib on tikosyn  and Xarelto . Previously, patient had hematuria and self discontinued Xarelto , was instructed to restart. Somewhat unclear if patient ever restarted anticoagulation.   Due to unclear hx of DOAC use, will monitor both heparin  level and aPTT to ensure correlation. Hgb and plts are stable in comparison to volume status. However, in setting of hemoptysis and unknown prior [facility], will forgo bolus for now  Heparin  level subtherapeutic at 0.26 on infusion at 1150 units/hr. Hgb and plts stable. No issues with line or bleeding reported per RN.  Goal of Therapy:  Heparin  level 0.3-0.7 units/ml Monitor platelets by anticoagulation protocol: Yes   Plan:  Increase heparin  infusion to 1300 units/hr Check daily heparin  level and CBC Monitor  H&H, plts, signs and symptoms   Thersea Manfredonia, PharmD PGY-1 Pharmacy Resident [facility]  02/05/2024 12:29 PM

## 2024-02-06 DIAGNOSIS — A419 Sepsis, unspecified organism: Secondary | ICD-10-CM | POA: Diagnosis not present

## 2024-02-06 DIAGNOSIS — J849 Interstitial pulmonary disease, unspecified: Secondary | ICD-10-CM | POA: Diagnosis not present

## 2024-02-06 DIAGNOSIS — I739 Peripheral vascular disease, unspecified: Secondary | ICD-10-CM | POA: Diagnosis not present

## 2024-02-06 LAB — BASIC METABOLIC PANEL WITH GFR
Anion gap: 7 (ref 5–15)
BUN: 13 mg/dL (ref 8–23)
CO2: 23 mmol/L (ref 22–32)
Calcium: 12.8 mg/dL — ABNORMAL HIGH (ref 8.9–10.3)
Chloride: 107 mmol/L (ref 98–111)
Creatinine, Ser: 0.89 mg/dL (ref 0.61–1.24)
GFR, Estimated: >60 mL/min (ref >60)
Glucose, Bld: 109 mg/dL — ABNORMAL HIGH (ref 70–99)
Potassium: 4.1 mmol/L (ref 3.5–5.1)
Sodium: 137 mmol/L (ref 135–145)

## 2024-02-06 LAB — ACID FAST SMEAR (AFB, MYCOBACTERIA): Acid Fast Smear: NEGATIVE

## 2024-02-06 LAB — CBC
HCT: 29 % — ABNORMAL LOW (ref 39.0–52.0)
Hemoglobin: 9.1 g/dL — ABNORMAL LOW (ref 13.0–17.0)
MCH: 25.6 pg — ABNORMAL LOW (ref 26.0–34.0)
MCHC: 31.4 g/dL (ref 30.0–36.0)
MCV: 81.5 fL (ref 80.0–100.0)
Platelets: 272 K/uL (ref 150–400)
RBC: 3.56 MIL/uL — ABNORMAL LOW (ref 4.22–5.81)
RDW: 18.1 % — ABNORMAL HIGH (ref 11.5–15.5)
WBC: 17.9 K/uL — ABNORMAL HIGH (ref 4.0–10.5)
nRBC: 0 % (ref 0.0–0.2)

## 2024-02-06 LAB — HEPARIN LEVEL (UNFRACTIONATED): Heparin Unfractionated: 0.31 [IU]/mL (ref 0.30–0.70)

## 2024-02-06 LAB — MAGNESIUM: Magnesium: 2.1 mg/dL (ref 1.7–2.4)

## 2024-02-06 MED ORDER — FUROSEMIDE 10 MG/ML IJ SOLN
40.0000 mg | Freq: Two times a day (BID) | INTRAMUSCULAR | Status: DC
  Administered 2024-02-06: 40 mg via INTRAVENOUS
  Filled 2024-02-06: qty 4

## 2024-02-06 NOTE — Plan of Care (Signed)
 Pt a&o 4, doing ok, self-reported wet and changed around 1530 today. New purewick given.  Velinda Server, RN 02/06/2024 7:56 PM

## 2024-02-06 NOTE — Progress Notes (Signed)
 Mobility Specialist Progress Note;   02/06/24 1005  Mobility  Activity  (Bed level exercises)  Range of Motion/Exercises Active Assistive;Right leg;Left leg  Activity Response Tolerated fair  Mobility Referral Yes  Mobility visit 1 Mobility  Mobility Specialist Start Time (ACUTE ONLY) 1005  Mobility Specialist Stop Time (ACUTE ONLY) 1014  Mobility Specialist Time Calculation (min) (ACUTE ONLY) 9 min   Pt deferred Oob mobility d/t fatigue and back pain, although encouraged. Agreeable to work on bed level BLE exercises. Actively assisted pt in exercises. C/o some toe pain, otherwise asx. VSS throughout. Pt left with all needs met. RN in room.   Lauraine Erm Mobility Specialist Please contact via SecureChat or [employer] [phone]

## 2024-02-06 NOTE — TOC Progression Note (Signed)
 Transition of Care [facility]) - Progression Note    Patient Details  Name: Bradley Ferguson MRN: [id number] Date of Birth: 1951/04/01  [facility] of [facility] CM/SW Contact  [name], [place] Phone Number: 02/06/2024, 10:11 AM  Clinical Narrative:   CSW provided patient with medicare.gov ratings for accepting SNFs. Patient has not chosen a facility at this time.   CSW will continue to follow.    Expected Discharge Plan: Skilled Nursing Facility Barriers to Discharge: Continued Medical Work up, SNF Pending bed offer, Insurance Authorization               Expected Discharge Plan and Services In-house Referral: Clinical Social Work     Living arrangements for the past 2 months: Single Family Home                                       Social Drivers of Health (SDOH) Interventions SDOH Screenings   Food Insecurity: Food Insecurity Present (02/03/2024)  Housing: Low Risk  (02/03/2024)  Transportation Needs: No Transportation Needs (02/03/2024)  Utilities: Not At Risk (02/03/2024)  Alcohol Screen: Low Risk  (12/29/2023)  Depression (PHQ2-9): Medium Risk (11/13/2023)  Financial Resource Strain: Medium Risk (12/29/2023)  Physical Activity: Inactive (12/29/2023)  Social Connections: Socially Isolated (02/03/2024)  Stress: Stress Concern Present (12/29/2023)  Tobacco Use: High Risk (02/03/2024)  Health Literacy: Adequate Health Literacy (11/13/2023)    Readmission Risk Interventions    02/04/2024    3:23 PM 11/10/2023    2:54 PM  Readmission Risk Prevention Plan  Post Dischage Appt  Complete  Medication Screening  Complete  Transportation Screening Complete Complete  PCP or Specialist Appt within 5-7 Days Complete   Home Care Screening Complete   Medication Review (RN CM) Complete

## 2024-02-06 NOTE — Progress Notes (Signed)
 PHARMACY - ANTICOAGULATION  Pharmacy Consult for heparin  Indication: atrial fibrillation Brief A/P: Heparin  level within goal range Continue Heparin  at current rate   Allergies  Allergen Reactions   Cymbalta  [Duloxetine  Hcl]     unknown   Effexor [Venlafaxine]     Feels bad   Wellbutrin  [Bupropion ]     sleepy   Zoloft [Sertraline Hcl]     Felt bad    Patient Measurements: Height: 6' 0.99 (185.4 cm) Weight: 68.1 kg (150 lb 2.1 oz) IBW/kg (Calculated) : 79.88 HEPARIN  DW (KG): 65.2  Vital Signs: Temp: 98.1 F (36.7 C) (08/09 0739) Temp Source: Oral (08/09 0739) BP: 143/96 (08/09 0739) Pulse Rate: 99 (08/09 0739)  Labs: Recent Labs    02/03/24 2039 02/03/24 2044 02/03/24 2339 02/04/24 2136 02/04/24 2136 02/05/24 0841 02/05/24 1036 02/05/24 2229 02/06/24 0234  HGB  --    < > 8.7*  --   --  9.2*  --   --  9.1*  HCT  --    < > 26.8*  --   --  28.7*  --   --  29.0*  PLT  --    < > 284  --   --  258  --   --  272  APTT  --   --   --  42*  --   --   --   --   --   HEPARINUNFRC  --   --   --  0.10*   < > 0.28* 0.26* 0.32 0.31  CREATININE  --    < > 0.91  --   --  0.82  --   --  0.89  TROPONINIHS 22*  --  19*  --   --   --   --   --   --    < > = values in this interval not displayed.    Estimated Creatinine Clearance: 72.3 mL/min (by C-G formula based on SCr of 0.89 mg/dL).   Assessment: 73 y.o. male with h/o Afib, PTA Xarelto  on hold (patient reported not taking anymore but fill history shows recent fills). Heparin  consult.  Heparin  level therapeutic at 0.31 on infusion rate of 1300 units/hr. Hgb and plts stable. No reported signs or symptoms of bleeding.  Goal of Therapy:  Heparin  level 0.3-0.7 units/ml Monitor platelets by anticoagulation protocol: Yes   Plan:  Continue heparin  1300 units/hr Monitor daily heparin  level and CBC Monitor H&H, plts, signs and symptoms of bleeding   Alexas Basulto, PharmD PGY-1 Pharmacy Resident [facility]  02/06/2024 9:13 AM

## 2024-02-06 NOTE — Progress Notes (Signed)
 RT note. Patient found on rm air sat 90%. Patient sat goal 92% at this time, patient placed on 2L  at this time sat 94%. No labored breathing noted at this time. RN to send off sputum sample. RT will continue to monitor.    02/06/24 0838  Aerosol Therapy Tx  $ Hand Held Nebulizer  1  Medications Breztri   Delivery Device MDI  Pre-Treatment Pulse 93  Pre-Treatment Respirations 20  Treatment Tolerance Tolerated well  Treatment Given 1  RT Breath Sounds  Bilateral Breath Sounds Diminished  Oxygen Therapy/Pulse Ox  O2 Device (S)  Nasal Cannula  O2 Therapy Oxygen  O2 Flow Rate (L/min) 2 L/min  SpO2 94 %

## 2024-02-06 NOTE — Progress Notes (Signed)
 Progress Note   Patient: Bradley Ferguson FMW:[id number] DOB: 12-20-1950 DOA: 02/03/2024  DOS: the patient was seen and examined on 02/06/2024   Brief hospital course:  73 y.o. male with medical history significant of patient with history of COPD, interstitial lung disease, patient has latent tuberculosis, atrial fibrillation on Tikosyn  and Xarelto , peripheral arterial disease, essential hypertension, anxiety disorder depression, history of hematuria with lung consolidation was on antibiotics by pulmonary.  Patient went to see pulmonary today due to near syncope and productive cough.  He was found to have significant dyspnea and tachycardia with heart rate in the 120s.  Patient noted to have oxygen saturation 90% on room air.  He was however noted to have hypercalcemia, leukocytosis with left shift and lactic acidosis.    Assessment and Plan:   Left upper lobe consolidation/mass with concern for AFB - CT 8/1 noting slightly increasing size of LUL dense consolidation concerning for infectious versus malignancy.  Worsening cough with hemoptysis on presentation.  Neurology consulted and following closely.  Given patient's history of latent TB, AFB ordered from sputum x 3 (today is 3rd).  Other differentials include postobstructive pneumonia or primary lung adenocarcinoma.  Currently on negative pressure isolation.  Empiric ceftriaxone  plus p.o. Doxy on board.  Pending AFB/TB rule out from sputum, may pursue bronchoscopy early next week.   Hypercalcemia - Greater than 15 on presentation.  Combination with LUL mass, concern for primary carcinoma.  PTH appropriately low.  Pending PTHRP.  Aggressive IV fluid hydration.  Will initiate IV Lasix  40 mg twice daily diuretic therapy.  Calcium  showing slow improvement.  Combined pulmonary fibrosis and emphysema - Currently not hypoxic but likely contributing to underlying dyspnea.  Nebulizers on board.   Persistent atrial fibrillation - Resume home medication  regimen.  Transition to heparin  drip.   Concern for severe sepsis - Leukocytosis, lactate greater than 3, tachycardia on presentation given concern for severe sepsis.  Blood cultures, IV fluid bolus, empiric antibiotics on board.  Lactate resolved.  Continue to monitor blood pressure closely.   Bipolar 1 disorder - Continue home regimen   Subjective: Patient resting comfortably this morning.  Still complaining of cough.  Denies any worsening shortness of breath, chest pain, nausea, vomiting, abdominal pain.  Did admit to having nausea after taking Norco last night.  Physical Exam:  Vitals:   02/06/24 0405 02/06/24 0739 02/06/24 0838 02/06/24 1000  BP: 122/79 (!) 143/96  129/84  Pulse:  99  (!) 115  Resp: 20 20    Temp: 98.7 F (37.1 C) 98.1 F (36.7 C)    TempSrc: Oral Oral    SpO2:  90% 94%   Weight: 68.1 kg     Height:        GENERAL:  Alert, pleasant, no acute distress, disheveled HEENT:  EOMI CARDIOVASCULAR: Irregularly irregular RESPIRATORY: Coarse rales bilaterally, coarse cough GASTROINTESTINAL:  Soft, nontender, nondistended EXTREMITIES:  No LE edema bilaterally NEURO:  No new focal deficits appreciated SKIN:  No rashes noted PSYCH:  Appropriate mood and affect    Data Reviewed:  Imaging Studies: DG Chest 2 View Result Date: 02/03/2024 CLINICAL DATA:  Shortness of breath.  Tachycardia. EXAM: CHEST - 2 VIEW COMPARISON:  12/29/2023 and CT chest 01/29/2024. FINDINGS: The trachea is midline. Heart size stable. Cavitary masslike consolidation in the left upper lobe has progressed from 12/29/2023. Associated pleural thickening and pleural fluid in the lateral and lower left hemithorax. Mild coarsening of the peripheral pulmonary markings. IMPRESSION: 1. Worsening cavitary masslike  consolidation in the left upper lobe, worrisome for primary bronchogenic carcinoma. Pneumonia is not excluded. 2. Partially loculated small left pleural effusion and/or thickening. 3. Interstitial  lung disease, better seen on 01/29/2024. Electronically Signed   By: Newell Eke M.D.   On: 02/03/2024 16:32   CT CHEST WO CONTRAST Result Date: 02/03/2024 CLINICAL DATA:  Pneumonia, complications suspected, x-ray done. * Tracking Code: BO * EXAM: CT CHEST WITHOUT CONTRAST TECHNIQUE: Multidetector CT imaging of the chest was performed following the standard protocol without IV contrast. RADIATION DOSE REDUCTION: This exam was performed according to the departmental dose-optimization program which includes automated exposure control, adjustment of the mA and/or kV according to patient size and/or use of iterative reconstruction technique. COMPARISON:  CT December 29, 2023 and June 04, 2021 FINDINGS: Cardiovascular: Aortic atherosclerosis. Three-vessel coronary artery calcifications/stents. Calcifications of the aortic valve and annulus. Calcifications of the mitral annulus. Normal size heart. No significant pericardial effusion/thickening. Mediastinum/Nodes: No suspicious thyroid  nodule. Prominent mediastinal lymph nodes are similar prior for instance an AP window lymph node measuring 9 mm in short axis on image 56/301 is unchanged. The esophagus is grossly unremarkable. Lungs/Pleura: Persistent dense consolidation with air bronchograms in the left upper lobe measuring 7.5 x 5.6 cm on image 46/302 previously 7.0 x 5.6 cm. Increased size of the adjacent pleural effusion/pleural thickening. Additional consolidative nodularity in the left lower lobe has increased from prior for instance measuring 17 x 10 mm on image 73/302 previously 14 x 7 mm. Similar findings of basilar predominant interstitial lung disease. Upper Abdomen: No acute abnormality. Musculoskeletal: No aggressive lytic or blastic lesion of bone. Multilevel degenerative changes spine. Diffuse demineralization of bone. IMPRESSION: 1. Slight increase in size of the dense mass like consolidation with air bronchograms in the left upper lobe with increased  adjacent pleural effusion/pleural thickening and increased size of additional consolidative nodularity in the left lower lobe. Findings are nonspecific and may reflect an infectious/inflammatory process with possible treatment failure, however underlying malignancy is a pertinent differential consideration warranting further evaluation. Suggest pulmonary consultation with consideration for direct tissue sampling and/or nuclear medicine PET-CT. 2. Prominent mediastinal lymph nodes are nonspecific but similar prior suggest continued attention on follow-up imaging. 3. Similar findings of basilar predominant interstitial lung disease. 4. Aortic atherosclerosis. Aortic Atherosclerosis (ICD10-I70.0). Electronically Signed   By: Reyes Holder M.D.   On: 02/03/2024 15:06   VAS US  LOWER EXTREMITY ARTERIAL DUPLEX Result Date: 01/19/2024 LOWER EXTREMITY ARTERIAL DUPLEX STUDY Patient Name:  VIVIAN OKELLEY  Date of Exam:   01/19/2024 Medical Rec #: [id number]       Accession #:    [id number] Date of Birth: [birth year]       Patient Gender: M Patient Age:   45 years Exam Location:  [address] Procedure:      VAS US  LOWER EXTREMITY ARTERIAL DUPLEX Referring Phys: [facility] --------------------------------------------------------------------------------  Indications: Peripheral artery disease. High Risk Factors: Hypertension, hyperlipidemia, current smoker.  Vascular Interventions: Successful diamondback orbital rotation                         arthrectomy, PT                         and covered stent at a subtotal calcified proximal                         right  common iliac artery on 04/27/2017. Angioplasty and                         stent                         of left common and external iliac artery, and left                         common femoral endarterectomy including                         profundoplasty                         and endarterectomy the proximal SFA with bovine                          pericardial patch angioplasty on 09/02/2023. Current ABI:            0.5/0.54 Comparison Study: 04/09/17 Performing Technologist: Garnette Rockers  Examination Guidelines: A complete evaluation includes B-mode imaging, spectral Doppler, color Doppler, and power Doppler as needed of all accessible portions of each vessel. Bilateral testing is considered an integral part of a complete examination. Limited examinations for reoccurring indications may be performed as noted.  +-----------+--------+-----+---------------+----------+-----------------------+ RIGHT      PSV cm/sRatioStenosis       Waveform  Comments                +-----------+--------+-----+---------------+----------+-----------------------+ CFA Distal 27                          biphasic                          +-----------+--------+-----+---------------+----------+-----------------------+ DFA        64           50-74% stenosismonophasic                        +-----------+--------+-----+---------------+----------+-----------------------+ SFA Prox                occluded                                         +-----------+--------+-----+---------------+----------+-----------------------+ SFA Mid                 occluded                                         +-----------+--------+-----+---------------+----------+-----------------------+ SFA Distal              occluded                                         +-----------+--------+-----+---------------+----------+-----------------------+ POP Prox   19                          monophasicReconstitution from DFA +-----------+--------+-----+---------------+----------+-----------------------+ POP Distal 27  monophasic                        +-----------+--------+-----+---------------+----------+-----------------------+ ATA Distal 19                          monophasic                         +-----------+--------+-----+---------------+----------+-----------------------+ PTA Distal 11                          monophasic                        +-----------+--------+-----+---------------+----------+-----------------------+ PERO Distal19                          monophasic                        +-----------+--------+-----+---------------+----------+-----------------------+ DFA Mid - 90 cm/s - monophasic DFA Dst - 51 cm/s - monophasic.  Summary: Right: 50-74% stenosis noted in the deep femoral artery. Total occlusion noted in the superficial femoral artery.  See table(s) above for measurements and observations. Electronically signed by Lonni Gaskins MD on 01/19/2024 at 12:31:40 PM.    Final    VAS US  ABI WITH/WO TBI Result Date: 01/19/2024  LOWER EXTREMITY DOPPLER STUDY Patient Name:  LESTON SCHUELLER  Date of Exam:   01/19/2024 Medical Rec #: [id number]       Accession #:    [id number] Date of Birth: [birth year]       Patient Gender: M Patient Age:   48 years Exam Location:  [address] Procedure:      VAS US  ABI WITH/WO TBI Referring Phys: [facility] --------------------------------------------------------------------------------  Indications: Peripheral artery disease. High Risk Factors: Hypertension, hyperlipidemia, current smoker.  Vascular Interventions: Successful diamondback orbital rotation                         arthrectomy, PT                         and covered stent at a subtotal calcified proximal                         right                         common iliac artery on 04/27/2017. Angioplasty and                         stent                         of left common and external iliac artery, and left                         common femoral endarterectomy including                         profundoplasty                         and endarterectomy the proximal SFA with bovine  pericardial patch angioplasty on 09/02/2023. Comparison Study: 10/13/23  Performing Technologist: Garnette Rockers  Examination Guidelines: A complete evaluation includes at minimum, Doppler waveform signals and systolic blood pressure reading at the level of bilateral brachial, anterior tibial, and posterior tibial arteries, when vessel segments are accessible. Bilateral testing is considered an integral part of a complete examination. Photoelectric Plethysmograph (PPG) waveforms and toe systolic pressure readings are included as required and additional duplex testing as needed. Limited examinations for reoccurring indications may be performed as noted.  ABI Findings: +---------+------------------+-----+-------------------+--------+ Right    Rt Pressure (mmHg)IndexWaveform           Comment  +---------+------------------+-----+-------------------+--------+ Brachial 139                                                +---------+------------------+-----+-------------------+--------+ PTA      53                0.38 dampened monophasic         +---------+------------------+-----+-------------------+--------+ DP       70                0.50 dampened monophasic         +---------+------------------+-----+-------------------+--------+ Great Toe0                 0.00 Absent                      +---------+------------------+-----+-------------------+--------+ +---------+------------------+-----+----------+-------+ Left     Lt Pressure (mmHg)IndexWaveform  Comment +---------+------------------+-----+----------+-------+ Brachial 139                                      +---------+------------------+-----+----------+-------+ PTA      75                0.54 monophasic        +---------+------------------+-----+----------+-------+ DP       66                0.47 monophasic        +---------+------------------+-----+----------+-------+ Great Toe34                0.24 Abnormal          +---------+------------------+-----+----------+-------+  +-------+-----------+-----------+------------+------------+ ABI/TBIToday's ABIToday's TBIPrevious ABIPrevious TBI +-------+-----------+-----------+------------+------------+ Right  0.5        0.0        0.28        0.0          +-------+-----------+-----------+------------+------------+ Left   0.54       0.24       0.64        0.3          +-------+-----------+-----------+------------+------------+  Summary: Right: Resting right ankle-brachial index indicates moderate right lower extremity arterial disease. The right toe-brachial index is abnormal. Left: Resting left ankle-brachial index indicates moderate left lower extremity arterial disease. The left toe-brachial index is abnormal. *See table(s) above for measurements and observations.  Electronically signed by Lonni Gaskins MD on 01/19/2024 at 11:19:29 AM.    Final     There are no new results to review at this time.  Previous records (including but not limited to H&P, progress notes, nursing notes, TOC management) were reviewed in assessment of this patient.  Labs: CBC: Recent  Labs  Lab 02/03/24 1548 02/03/24 2044 02/03/24 2339 02/05/24 0841 02/06/24 0234  WBC 21.7* 19.6* 18.0* 15.4* 17.9*  NEUTROABS 19.3*  --   --   --   --   HGB 11.3* 9.9* 8.7* 9.2* 9.1*  HCT 35.7* 32.2* 26.8* 28.7* 29.0*  MCV 81.3 81.5 81.0 81.5 81.5  PLT 354 304 284 258 272   Basic Metabolic Panel: Recent Labs  Lab 02/03/24 1548 02/03/24 1723 02/03/24 1812 02/03/24 2044 02/03/24 2339 02/05/24 0841 02/06/24 0234  NA 140  --  137  --  139 138 137  K 3.7  --  3.7  --  3.2* 3.5 4.1  CL 98  --  99  --  104 103 107  CO2 29  --  30  --  27 24 23   GLUCOSE 169*  --  127*  --  124* 90 109*  BUN 35*  --  32*  --  28* 16 13  CREATININE 1.15  --  0.91 1.00 0.91 0.82 0.89  CALCIUM  >15.0*  --  13.3*  --  12.4* 12.8* 12.8*  MG  --   --   --   --   --  1.6* 2.1  PHOS  --  2.5  --   --   --   --   --    Liver Function Tests: Recent Labs  Lab  02/03/24 1548 02/03/24 2339  AST 22 19  ALT 12 12  ALKPHOS 116 70  BILITOT 0.6 0.5  PROT 7.4 5.4*  ALBUMIN  3.7 2.2*   CBG: No results for input(s): GLUCAP in the last 168 hours.  Scheduled Meds:  budesonide -glycopyrrolate -formoterol   2 puff Inhalation BID   cholecalciferol   10,000 Units Oral QPM   dofetilide   500 mcg Oral BID   doxycycline   100 mg Oral Q12H   gabapentin   300 mg Oral TID   polyethylene glycol  17 g Oral Daily   pyridOXINE   100 mg Oral QPM   Continuous Infusions:  sodium chloride      sodium chloride  125 mL/hr at 02/06/24 0528   cefTRIAXone  (ROCEPHIN )  IV 1 g (02/05/24 1605)   heparin  1,300 Units/hr (02/05/24 1621)   PRN Meds:.albuterol , bisacodyl , HYDROcodone -acetaminophen , ketoconazole , sodium chloride  HYPERTONIC, triamcinolone  cream  Family Communication: At bedside  Disposition: Status is: Inpatient Remains inpatient appropriate because: See above     Time spent: 35 minutes  Length of inpatient stay: 3 days  Author: Carliss LELON Canales, DO 02/06/2024 12:04 PM  For on call review [email or web address].

## 2024-02-07 DIAGNOSIS — A419 Sepsis, unspecified organism: Secondary | ICD-10-CM | POA: Diagnosis not present

## 2024-02-07 DIAGNOSIS — Z227 Latent tuberculosis: Secondary | ICD-10-CM | POA: Diagnosis not present

## 2024-02-07 DIAGNOSIS — I480 Paroxysmal atrial fibrillation: Secondary | ICD-10-CM | POA: Diagnosis not present

## 2024-02-07 LAB — CULTURE, RESPIRATORY W GRAM STAIN

## 2024-02-07 LAB — CBC
HCT: 29.3 % — ABNORMAL LOW (ref 39.0–52.0)
Hemoglobin: 9.4 g/dL — ABNORMAL LOW (ref 13.0–17.0)
MCH: 25.8 pg — ABNORMAL LOW (ref 26.0–34.0)
MCHC: 32.1 g/dL (ref 30.0–36.0)
MCV: 80.3 fL (ref 80.0–100.0)
Platelets: 319 K/uL (ref 150–400)
RBC: 3.65 MIL/uL — ABNORMAL LOW (ref 4.22–5.81)
RDW: 18.4 % — ABNORMAL HIGH (ref 11.5–15.5)
WBC: 21.8 K/uL — ABNORMAL HIGH (ref 4.0–10.5)
nRBC: 0 % (ref 0.0–0.2)

## 2024-02-07 LAB — BASIC METABOLIC PANEL WITH GFR
Anion gap: 11 (ref 5–15)
BUN: 12 mg/dL (ref 8–23)
CO2: 25 mmol/L (ref 22–32)
Calcium: 13.3 mg/dL (ref 8.9–10.3)
Chloride: 103 mmol/L (ref 98–111)
Creatinine, Ser: 0.94 mg/dL (ref 0.61–1.24)
GFR, Estimated: >60 mL/min (ref >60)
Glucose, Bld: 126 mg/dL — ABNORMAL HIGH (ref 70–99)
Potassium: 3.2 mmol/L — ABNORMAL LOW (ref 3.5–5.1)
Sodium: 139 mmol/L (ref 135–145)

## 2024-02-07 LAB — ACID FAST SMEAR (AFB, MYCOBACTERIA): Acid Fast Smear: NEGATIVE

## 2024-02-07 LAB — HEPARIN LEVEL (UNFRACTIONATED)
Heparin Unfractionated: 0.23 [IU]/mL — ABNORMAL LOW (ref 0.30–0.70)
Heparin Unfractionated: 0.29 [IU]/mL — ABNORMAL LOW (ref 0.30–0.70)

## 2024-02-07 LAB — MAGNESIUM: Magnesium: 1.6 mg/dL — ABNORMAL LOW (ref 1.7–2.4)

## 2024-02-07 MED ORDER — MAGNESIUM SULFATE 2 GM/50ML IV SOLN
2.0000 g | Freq: Once | INTRAVENOUS | Status: AC
  Administered 2024-02-07: 2 g via INTRAVENOUS
  Filled 2024-02-07: qty 50

## 2024-02-07 MED ORDER — ZOLEDRONIC ACID 4 MG/100ML IV SOLN
4.0000 mg | Freq: Once | INTRAVENOUS | Status: AC
  Administered 2024-02-07: 4 mg via INTRAVENOUS
  Filled 2024-02-07: qty 100

## 2024-02-07 MED ORDER — ALENDRONATE SODIUM 10 MG PO TABS: 40.0000 mg | ORAL_TABLET | Freq: Once | ORAL | Status: DC

## 2024-02-07 MED ORDER — SORBITOL 70 % SOLN
30.0000 mL | Freq: Once | Status: DC
  Filled 2024-02-07: qty 30

## 2024-02-07 MED ORDER — GUAIFENESIN-DM 100-10 MG/5ML PO SYRP
5.0000 mL | ORAL_SOLUTION | ORAL | Status: DC | PRN
  Administered 2024-02-07 – 2024-02-18 (×14): 5 mL via ORAL
  Filled 2024-02-07 (×10): qty 5

## 2024-02-07 MED ORDER — BISACODYL 10 MG RE SUPP
10.0000 mg | Freq: Every day | RECTAL | Status: DC | PRN
  Administered 2024-02-07: 10 mg via RECTAL
  Filled 2024-02-07: qty 1

## 2024-02-07 MED ORDER — BISACODYL 5 MG PO TBEC: 5.0000 mg | DELAYED_RELEASE_TABLET | Freq: Once | ORAL | Status: DC

## 2024-02-07 MED ORDER — SODIUM CHLORIDE 0.9 % IV BOLUS
1000.0000 mL | Freq: Once | INTRAVENOUS | Status: AC
  Administered 2024-02-07: 1000 mL via INTRAVENOUS

## 2024-02-07 MED ORDER — FUROSEMIDE 10 MG/ML IJ SOLN
60.0000 mg | Freq: Three times a day (TID) | INTRAMUSCULAR | Status: DC
  Administered 2024-02-07 – 2024-02-10 (×16): 60 mg via INTRAVENOUS
  Filled 2024-02-07 (×9): qty 6

## 2024-02-07 MED ORDER — POTASSIUM CHLORIDE CRYS ER 20 MEQ PO TBCR
40.0000 meq | EXTENDED_RELEASE_TABLET | ORAL | Status: AC
  Administered 2024-02-07 (×2): 40 meq via ORAL
  Filled 2024-02-07 (×2): qty 2

## 2024-02-07 NOTE — Progress Notes (Signed)
 Date and time results received: 02/07/24 0415  (use smartphrase .now to insert current time)  Test: Calcium  Critical Value: 13.3  Name of Provider Notified: Von, MD  Orders Received? Or Actions Taken?: Hypercalcemia being treated.  MD made aware of Magnesium  1.6 and potassium 3.2. New orders placed.

## 2024-02-07 NOTE — Progress Notes (Addendum)
 PHARMACY - ANTICOAGULATION  Pharmacy Consult for heparin  Indication: atrial fibrillation   Allergies  Allergen Reactions   Cymbalta  [Duloxetine  Hcl]     unknown   Effexor [Venlafaxine]     Feels bad   Wellbutrin  [Bupropion ]     sleepy   Zoloft [Sertraline Hcl]     Felt bad    Patient Measurements: Height: 6' 0.99 (185.4 cm) Weight: 67.1 kg (147 lb 14.9 oz) IBW/kg (Calculated) : 79.88 HEPARIN  DW (KG): 65.2  Vital Signs: Temp: 98 F (36.7 C) (08/10 1200) Temp Source: Oral (08/10 1200) BP: 108/69 (08/10 1200) Pulse Rate: 94 (08/10 1200)  Labs: Recent Labs    02/04/24 2136 02/04/24 2136 02/05/24 0841 02/05/24 1036 02/06/24 0234 02/07/24 0300 02/07/24 1257  HGB  --    < > 9.2*  --  9.1* 9.4*  --   HCT  --   --  28.7*  --  29.0* 29.3*  --   PLT  --   --  258  --  272 319  --   APTT 42*  --   --   --   --   --   --   HEPARINUNFRC 0.10*  --  0.28*   < > 0.31 0.23* 0.29*  CREATININE  --   --  0.82  --  0.89 0.94  --    < > = values in this interval not displayed.    Estimated Creatinine Clearance: 67.4 mL/min (by C-G formula based on SCr of 0.94 mg/dL).   Assessment: 73 y.o. male with h/o Afib, PTA Xarelto  on hold (patient reported not taking anymore but fill history shows recent fills). Heparin  consult.  8/10 PM update:  Heparin  level sub-therapeutic at 0.29 at a rate of 1400 units/hr CBC stable; no issues with infusion or s/sx of bleeding per nurse.  Goal of Therapy:  Heparin  level 0.3-0.7 units/ml Monitor platelets by anticoagulation protocol: Yes   Plan:  Increase heparin  to 1550 units/hr Heparin  level in 8 hours Monitor daily heparin  level and CBC Monitor H&H, plts, signs and symptoms of bleeding  R. Samual Satterfield, PharmD PGY-1 Acute Care Pharmacy Resident [facility] 02/07/2024 2:14 PM

## 2024-02-07 NOTE — TOC Progression Note (Signed)
 Transition of Care [facility]) - Progression Note    Patient Details  Name: Bradley Ferguson MRN: [id number] Date of Birth: Oct 26, 1950  [facility] CM/SW Contact  Isaiah Public, LCSWA Phone Number: 02/07/2024, 10:46 AM  Clinical Narrative:     Patients passr currently pending. CSW spoke with patient and provided SNF bed offers. Patient accepted SNF bed offer with [facility]. CSW confirmed SNF bed offers with Kia with [facility]. CSW following to start insurance authorization closer to patient being medically ready. CSW will continue to follow.  Expected Discharge Plan: Skilled Nursing Facility Barriers to Discharge: Continued Medical Work up, SNF Pending bed offer, Insurance Authorization               Expected Discharge Plan and Services In-house Referral: Clinical Social Work     Living arrangements for the past 2 months: Single Family Home                                       Social Drivers of Health (SDOH) Interventions SDOH Screenings   Food Insecurity: Food Insecurity Present (02/03/2024)  Housing: Low Risk  (02/03/2024)  Transportation Needs: No Transportation Needs (02/03/2024)  Utilities: Not At Risk (02/03/2024)  Alcohol Screen: Low Risk  (12/29/2023)  Depression (PHQ2-9): Medium Risk (11/13/2023)  Financial Resource Strain: Medium Risk (12/29/2023)  Physical Activity: Inactive (12/29/2023)  Social Connections: Socially Isolated (02/03/2024)  Stress: Stress Concern Present (12/29/2023)  Tobacco Use: High Risk (02/03/2024)  Health Literacy: Adequate Health Literacy (11/13/2023)    Readmission Risk Interventions    02/04/2024    3:23 PM 11/10/2023    2:54 PM  Readmission Risk Prevention Plan  Post Dischage Appt  Complete  Medication Screening  Complete  Transportation Screening Complete Complete  PCP or Specialist Appt within 5-7 Days Complete   Home Care Screening Complete   Medication Review (RN CM) Complete

## 2024-02-07 NOTE — Progress Notes (Signed)
 PHARMACY - ANTICOAGULATION  Pharmacy Consult for heparin  Indication: atrial fibrillation   Allergies  Allergen Reactions   Cymbalta  [Duloxetine  Hcl]     unknown   Effexor [Venlafaxine]     Feels bad   Wellbutrin  [Bupropion ]     sleepy   Zoloft [Sertraline Hcl]     Felt bad    Patient Measurements: Height: 6' 0.99 (185.4 cm) Weight: 68.1 kg (150 lb 2.1 oz) IBW/kg (Calculated) : 79.88 HEPARIN  DW (KG): 65.2  Vital Signs: Temp: 98.6 F (37 C) (08/09 2333) Temp Source: Oral (08/09 2333) BP: 113/73 (08/09 2333) Pulse Rate: 101 (08/09 2333)  Labs: Recent Labs    02/04/24 2136 02/04/24 2136 02/05/24 0841 02/05/24 1036 02/05/24 2229 02/06/24 0234 02/07/24 0300  HGB  --    < > 9.2*  --   --  9.1* 9.4*  HCT  --   --  28.7*  --   --  29.0* 29.3*  PLT  --   --  258  --   --  272 319  APTT 42*  --   --   --   --   --   --   HEPARINUNFRC 0.10*  --  0.28*   < > 0.32 0.31 0.23*  CREATININE  --   --  0.82  --   --  0.89 0.94   < > = values in this interval not displayed.    Estimated Creatinine Clearance: 68.4 mL/min (by C-G formula based on SCr of 0.94 mg/dL).   Assessment: 73 y.o. male with h/o Afib, PTA Xarelto  on hold (patient reported not taking anymore but fill history shows recent fills). Heparin  consult.  Heparin  level therapeutic at 0.31 on infusion rate of 1300 units/hr. Hgb and plts stable. No reported signs or symptoms of bleeding.  8/10 AM update:  Heparin  level sub-therapeutic  Hgb stable  Goal of Therapy:  Heparin  level 0.3-0.7 units/ml Monitor platelets by anticoagulation protocol: Yes   Plan:  Inc heparin  to 1400 units/hr Heparin  level in 8 hours Monitor daily heparin  level and CBC Monitor H&H, plts, signs and symptoms of bleeding  Lynwood Mckusick, PharmD, BCPS Clinical Pharmacist Phone: [phone]

## 2024-02-07 NOTE — Progress Notes (Signed)
 Progress Note   Patient: Bradley Ferguson FMW:[id number] DOB: Jan 03, 1951 DOA: 02/03/2024  DOS: the patient was seen and examined on 02/07/2024   Brief hospital course:  73 y.o. male with medical history significant of patient with history of COPD, interstitial lung disease, patient has latent tuberculosis, atrial fibrillation on Tikosyn  and Xarelto , peripheral arterial disease, essential hypertension, anxiety disorder depression, history of hematuria with lung consolidation was on antibiotics by pulmonary.  Patient went to see pulmonary today due to near syncope and productive cough.  He was found to have significant dyspnea and tachycardia with heart rate in the 120s.  Patient noted to have oxygen saturation 90% on room air.  He was however noted to have hypercalcemia, leukocytosis with left shift and lactic acidosis.    Assessment and Plan:   Left upper lobe consolidation/mass with concern for AFB - CT 8/1 noting slightly increasing size of LUL dense consolidation concerning for infectious versus malignancy.  Worsening cough with hemoptysis on presentation.  Neurology consulted and following closely.  Given patient's history of latent TB, AFB ordered from sputum x 3 (completed last collection 8/9).  Other differentials include postobstructive pneumonia or primary lung adenocarcinoma.  Currently on negative pressure isolation.  Empiric ceftriaxone  plus p.o. Doxy on board (day 5 of 7).  Pending AFB/TB rule out from sputum, may pursue bronchoscopy early next week.   Hypercalcemia - Greater than 15 on presentation.  Combination with LUL mass, concern for primary carcinoma.  PTH appropriately low.  Pending PTHRP.  Aggressive IV fluid hydration.  Despite IV fluids and IV Lasix , showed increase up to 13.3 this morning.  Will order NS bolus, increase Lasix  to every 8 hours, and will order zoledronic  acid x 1.  Will recheck calcium  in AM.   Combined pulmonary fibrosis and emphysema - Currently not hypoxic but  likely contributing to underlying dyspnea.  Nebulizers on board.   Persistent atrial fibrillation - Resume home medication regimen.  Transition to heparin  drip.   Concern for severe sepsis - Leukocytosis, lactate greater than 3, tachycardia on presentation given concern for severe sepsis.  Blood cultures, IV fluid bolus, empiric antibiotics on board.  Lactate resolved.  Still has remarkable leukocytosis.  Continue to monitor blood pressure closely.   Bipolar 1 disorder - Continue home regimen   Subjective: Patient resting well this morning.  States he feels a bit weak and slow compared to yesterday.  Denies any worsening shortness of breath, chest pain, nausea, vomiting, abdominal pain.  Physical Exam:  Vitals:   02/06/24 2027 02/06/24 2333 02/07/24 0500 02/07/24 0821  BP:  113/73 122/77   Pulse:  (!) 101 64   Resp:  20 20 20   Temp: 98.4 F (36.9 C) 98.6 F (37 C) 98.1 F (36.7 C) 98.2 F (36.8 C)  TempSrc: Oral Oral Oral Oral  SpO2:  93% 95% 95%  Weight:   67.1 kg   Height:        GENERAL:  Alert, pleasant, no acute distress, disheveled HEENT:  EOMI CARDIOVASCULAR: Irregularly irregular RESPIRATORY: Coarse rales bilaterally, coarse cough GASTROINTESTINAL:  Soft, nontender, nondistended EXTREMITIES:  No LE edema bilaterally NEURO:  No new focal deficits appreciated SKIN:  No rashes noted PSYCH:  Appropriate mood and affect    Data Reviewed:  Imaging Studies: DG Chest 2 View Result Date: 02/03/2024 CLINICAL DATA:  Shortness of breath.  Tachycardia. EXAM: CHEST - 2 VIEW COMPARISON:  12/29/2023 and CT chest 01/29/2024. FINDINGS: The trachea is midline. Heart size stable. Cavitary masslike consolidation  in the left upper lobe has progressed from 12/29/2023. Associated pleural thickening and pleural fluid in the lateral and lower left hemithorax. Mild coarsening of the peripheral pulmonary markings. IMPRESSION: 1. Worsening cavitary masslike consolidation in the left upper  lobe, worrisome for primary bronchogenic carcinoma. Pneumonia is not excluded. 2. Partially loculated small left pleural effusion and/or thickening. 3. Interstitial lung disease, better seen on 01/29/2024. Electronically Signed   By: Newell Eke M.D.   On: 02/03/2024 16:32   CT CHEST WO CONTRAST Result Date: 02/03/2024 CLINICAL DATA:  Pneumonia, complications suspected, x-ray done. * Tracking Code: BO * EXAM: CT CHEST WITHOUT CONTRAST TECHNIQUE: Multidetector CT imaging of the chest was performed following the standard protocol without IV contrast. RADIATION DOSE REDUCTION: This exam was performed according to the departmental dose-optimization program which includes automated exposure control, adjustment of the mA and/or kV according to patient size and/or use of iterative reconstruction technique. COMPARISON:  CT December 29, 2023 and June 04, 2021 FINDINGS: Cardiovascular: Aortic atherosclerosis. Three-vessel coronary artery calcifications/stents. Calcifications of the aortic valve and annulus. Calcifications of the mitral annulus. Normal size heart. No significant pericardial effusion/thickening. Mediastinum/Nodes: No suspicious thyroid  nodule. Prominent mediastinal lymph nodes are similar prior for instance an AP window lymph node measuring 9 mm in short axis on image 56/301 is unchanged. The esophagus is grossly unremarkable. Lungs/Pleura: Persistent dense consolidation with air bronchograms in the left upper lobe measuring 7.5 x 5.6 cm on image 46/302 previously 7.0 x 5.6 cm. Increased size of the adjacent pleural effusion/pleural thickening. Additional consolidative nodularity in the left lower lobe has increased from prior for instance measuring 17 x 10 mm on image 73/302 previously 14 x 7 mm. Similar findings of basilar predominant interstitial lung disease. Upper Abdomen: No acute abnormality. Musculoskeletal: No aggressive lytic or blastic lesion of bone. Multilevel degenerative changes spine.  Diffuse demineralization of bone. IMPRESSION: 1. Slight increase in size of the dense mass like consolidation with air bronchograms in the left upper lobe with increased adjacent pleural effusion/pleural thickening and increased size of additional consolidative nodularity in the left lower lobe. Findings are nonspecific and may reflect an infectious/inflammatory process with possible treatment failure, however underlying malignancy is a pertinent differential consideration warranting further evaluation. Suggest pulmonary consultation with consideration for direct tissue sampling and/or nuclear medicine PET-CT. 2. Prominent mediastinal lymph nodes are nonspecific but similar prior suggest continued attention on follow-up imaging. 3. Similar findings of basilar predominant interstitial lung disease. 4. Aortic atherosclerosis. Aortic Atherosclerosis (ICD10-I70.0). Electronically Signed   By: Reyes Holder M.D.   On: 02/03/2024 15:06   VAS US  LOWER EXTREMITY ARTERIAL DUPLEX Result Date: 01/19/2024 LOWER EXTREMITY ARTERIAL DUPLEX STUDY Patient Name:  Bradley Ferguson  Date of Exam:   01/19/2024 Medical Rec #: [id number]       Accession #:    [id number] Date of Birth: 01-28-1951       Patient Gender: M Patient Age:   66 years Exam Location:  [address] Procedure:      VAS US  LOWER EXTREMITY ARTERIAL DUPLEX Referring Phys: [facility] --------------------------------------------------------------------------------  Indications: Peripheral artery disease. High Risk Factors: Hypertension, hyperlipidemia, current smoker.  Vascular Interventions: Successful diamondback orbital rotation                         arthrectomy, PT                         and covered stent at  a subtotal calcified proximal                         right                         common iliac artery on 04/27/2017. Angioplasty and                         stent                         of left common and external iliac artery, and left                          common femoral endarterectomy including                         profundoplasty                         and endarterectomy the proximal SFA with bovine                         pericardial patch angioplasty on 09/02/2023. Current ABI:            0.5/0.54 Comparison Study: 04/09/17 Performing Technologist: Garnette Rockers  Examination Guidelines: A complete evaluation includes B-mode imaging, spectral Doppler, color Doppler, and power Doppler as needed of all accessible portions of each vessel. Bilateral testing is considered an integral part of a complete examination. Limited examinations for reoccurring indications may be performed as noted.  +-----------+--------+-----+---------------+----------+-----------------------+ RIGHT      PSV cm/sRatioStenosis       Waveform  Comments                +-----------+--------+-----+---------------+----------+-----------------------+ CFA Distal 27                          biphasic                          +-----------+--------+-----+---------------+----------+-----------------------+ DFA        64           50-74% stenosismonophasic                        +-----------+--------+-----+---------------+----------+-----------------------+ SFA Prox                occluded                                         +-----------+--------+-----+---------------+----------+-----------------------+ SFA Mid                 occluded                                         +-----------+--------+-----+---------------+----------+-----------------------+ SFA Distal              occluded                                         +-----------+--------+-----+---------------+----------+-----------------------+  POP Prox   19                          monophasicReconstitution from DFA +-----------+--------+-----+---------------+----------+-----------------------+ POP Distal 27                          monophasic                         +-----------+--------+-----+---------------+----------+-----------------------+ ATA Distal 19                          monophasic                        +-----------+--------+-----+---------------+----------+-----------------------+ PTA Distal 11                          monophasic                        +-----------+--------+-----+---------------+----------+-----------------------+ PERO Distal19                          monophasic                        +-----------+--------+-----+---------------+----------+-----------------------+ DFA Mid - 90 cm/s - monophasic DFA Dst - 51 cm/s - monophasic.  Summary: Right: 50-74% stenosis noted in the deep femoral artery. Total occlusion noted in the superficial femoral artery.  See table(s) above for measurements and observations. Electronically signed by Lonni Gaskins MD on 01/19/2024 at 12:31:40 PM.    Final    VAS US  ABI WITH/WO TBI Result Date: 01/19/2024  LOWER EXTREMITY DOPPLER STUDY Patient Name:  Bradley Ferguson  Date of Exam:   01/19/2024 Medical Rec #: [id number]       Accession #:    [id number] Date of Birth: 1950/12/01       Patient Gender: M Patient Age:   33 years Exam Location:  [address] Procedure:      VAS US  ABI WITH/WO TBI Referring Phys: [facility] --------------------------------------------------------------------------------  Indications: Peripheral artery disease. High Risk Factors: Hypertension, hyperlipidemia, current smoker.  Vascular Interventions: Successful diamondback orbital rotation                         arthrectomy, PT                         and covered stent at a subtotal calcified proximal                         right                         common iliac artery on 04/27/2017. Angioplasty and                         stent                         of left common and external iliac artery, and left  common femoral endarterectomy including                         profundoplasty                          and endarterectomy the proximal SFA with bovine                         pericardial patch angioplasty on 09/02/2023. Comparison Study: 10/13/23 Performing Technologist: Garnette Rockers  Examination Guidelines: A complete evaluation includes at minimum, Doppler waveform signals and systolic blood pressure reading at the level of bilateral brachial, anterior tibial, and posterior tibial arteries, when vessel segments are accessible. Bilateral testing is considered an integral part of a complete examination. Photoelectric Plethysmograph (PPG) waveforms and toe systolic pressure readings are included as required and additional duplex testing as needed. Limited examinations for reoccurring indications may be performed as noted.  ABI Findings: +---------+------------------+-----+-------------------+--------+ Right    Rt Pressure (mmHg)IndexWaveform           Comment  +---------+------------------+-----+-------------------+--------+ Brachial 139                                                +---------+------------------+-----+-------------------+--------+ PTA      53                0.38 dampened monophasic         +---------+------------------+-----+-------------------+--------+ DP       70                0.50 dampened monophasic         +---------+------------------+-----+-------------------+--------+ Great Toe0                 0.00 Absent                      +---------+------------------+-----+-------------------+--------+ +---------+------------------+-----+----------+-------+ Left     Lt Pressure (mmHg)IndexWaveform  Comment +---------+------------------+-----+----------+-------+ Brachial 139                                      +---------+------------------+-----+----------+-------+ PTA      75                0.54 monophasic        +---------+------------------+-----+----------+-------+ DP       66                0.47 monophasic         +---------+------------------+-----+----------+-------+ Great Toe34                0.24 Abnormal          +---------+------------------+-----+----------+-------+ +-------+-----------+-----------+------------+------------+ ABI/TBIToday's ABIToday's TBIPrevious ABIPrevious TBI +-------+-----------+-----------+------------+------------+ Right  0.5        0.0        0.28        0.0          +-------+-----------+-----------+------------+------------+ Left   0.54       0.24       0.64        0.3          +-------+-----------+-----------+------------+------------+  Summary: Right: Resting right ankle-brachial index indicates moderate right lower extremity arterial disease. The right toe-brachial  index is abnormal. Left: Resting left ankle-brachial index indicates moderate left lower extremity arterial disease. The left toe-brachial index is abnormal. *See table(s) above for measurements and observations.  Electronically signed by Lonni Gaskins MD on 01/19/2024 at 11:19:29 AM.    Final     As above  Previous records (including but not limited to H&P, progress notes, nursing notes, TOC management) were reviewed in assessment of this patient.  Labs: CBC: Recent Labs  Lab 02/03/24 1548 02/03/24 2044 02/03/24 2339 02/05/24 0841 02/06/24 0234 02/07/24 0300  WBC 21.7* 19.6* 18.0* 15.4* 17.9* 21.8*  NEUTROABS 19.3*  --   --   --   --   --   HGB 11.3* 9.9* 8.7* 9.2* 9.1* 9.4*  HCT 35.7* 32.2* 26.8* 28.7* 29.0* 29.3*  MCV 81.3 81.5 81.0 81.5 81.5 80.3  PLT 354 304 284 258 272 319   Basic Metabolic Panel: Recent Labs  Lab 02/03/24 1723 02/03/24 1812 02/03/24 2044 02/03/24 2339 02/05/24 0841 02/06/24 0234 02/07/24 0300  NA  --  137  --  139 138 137 139  K  --  3.7  --  3.2* 3.5 4.1 3.2*  CL  --  99  --  104 103 107 103  CO2  --  30  --  27 24 23 25   GLUCOSE  --  127*  --  124* 90 109* 126*  BUN  --  32*  --  28* 16 13 12   CREATININE  --  0.91 1.00 0.91 0.82 0.89 0.94   CALCIUM   --  13.3*  --  12.4* 12.8* 12.8* 13.3*  MG  --   --   --   --  1.6* 2.1 1.6*  PHOS 2.5  --   --   --   --   --   --    Liver Function Tests: Recent Labs  Lab 02/03/24 1548 02/03/24 2339  AST 22 19  ALT 12 12  ALKPHOS 116 70  BILITOT 0.6 0.5  PROT 7.4 5.4*  ALBUMIN  3.7 2.2*   CBG: No results for input(s): GLUCAP in the last 168 hours.  Scheduled Meds:  bisacodyl   5 mg Oral Once   budesonide -glycopyrrolate -formoterol   2 puff Inhalation BID   cholecalciferol   10,000 Units Oral QPM   dofetilide   500 mcg Oral BID   doxycycline   100 mg Oral Q12H   furosemide   60 mg Intravenous Q8H   gabapentin   300 mg Oral TID   polyethylene glycol  17 g Oral Daily   potassium chloride   40 mEq Oral Q4H   pyridOXINE   100 mg Oral QPM   sorbitol   30 mL Oral Once   Continuous Infusions:  sodium chloride  125 mL/hr at 02/07/24 0554   cefTRIAXone  (ROCEPHIN )  IV 1 g (02/06/24 1714)   heparin  1,400 Units/hr (02/07/24 1101)   PRN Meds:.albuterol , bisacodyl , bisacodyl , guaiFENesin -dextromethorphan , HYDROcodone -acetaminophen , ketoconazole , sodium chloride  HYPERTONIC, triamcinolone  cream  Family Communication: None at bedside  Disposition: Status is: Inpatient Remains inpatient appropriate because: Hypercalcemia     Time spent: 38 minutes  Length of inpatient stay: 4 days  Author: Carliss LELON Canales, DO 02/07/2024 12:15 PM  For on call review [email or web address].

## 2024-02-08 ENCOUNTER — Inpatient Hospital Stay (HOSPITAL_COMMUNITY)

## 2024-02-08 ENCOUNTER — Telehealth: Payer: Self-pay

## 2024-02-08 DIAGNOSIS — R911 Solitary pulmonary nodule: Secondary | ICD-10-CM

## 2024-02-08 DIAGNOSIS — J410 Simple chronic bronchitis: Secondary | ICD-10-CM | POA: Diagnosis not present

## 2024-02-08 DIAGNOSIS — J849 Interstitial pulmonary disease, unspecified: Secondary | ICD-10-CM | POA: Diagnosis not present

## 2024-02-08 DIAGNOSIS — A419 Sepsis, unspecified organism: Secondary | ICD-10-CM | POA: Diagnosis not present

## 2024-02-08 DIAGNOSIS — I480 Paroxysmal atrial fibrillation: Secondary | ICD-10-CM | POA: Diagnosis not present

## 2024-02-08 LAB — CBC
HCT: 31.5 % — ABNORMAL LOW (ref 39.0–52.0)
Hemoglobin: 9.8 g/dL — ABNORMAL LOW (ref 13.0–17.0)
MCH: 25.4 pg — ABNORMAL LOW (ref 26.0–34.0)
MCHC: 31.1 g/dL (ref 30.0–36.0)
MCV: 81.6 fL (ref 80.0–100.0)
Platelets: 329 K/uL (ref 150–400)
RBC: 3.86 MIL/uL — ABNORMAL LOW (ref 4.22–5.81)
RDW: 18.8 % — ABNORMAL HIGH (ref 11.5–15.5)
WBC: 21.6 K/uL — ABNORMAL HIGH (ref 4.0–10.5)
nRBC: 0 % (ref 0.0–0.2)

## 2024-02-08 LAB — BASIC METABOLIC PANEL WITH GFR
Anion gap: 10 (ref 5–15)
BUN: 18 mg/dL (ref 8–23)
CO2: 29 mmol/L (ref 22–32)
Calcium: 12.9 mg/dL — ABNORMAL HIGH (ref 8.9–10.3)
Chloride: 101 mmol/L (ref 98–111)
Creatinine, Ser: 1.12 mg/dL (ref 0.61–1.24)
GFR, Estimated: >60 mL/min (ref >60)
Glucose, Bld: 136 mg/dL — ABNORMAL HIGH (ref 70–99)
Potassium: 3.7 mmol/L (ref 3.5–5.1)
Sodium: 140 mmol/L (ref 135–145)

## 2024-02-08 LAB — CULTURE, BLOOD (ROUTINE X 2)
Culture: NO GROWTH
Culture: NO GROWTH
Special Requests: ADEQUATE
Special Requests: ADEQUATE

## 2024-02-08 LAB — SEDIMENTATION RATE: Sed Rate: 80 mm/h — ABNORMAL HIGH (ref 0–16)

## 2024-02-08 LAB — QUANTIFERON-TB GOLD PLUS: QuantiFERON-TB Gold Plus: NEGATIVE

## 2024-02-08 LAB — QUANTIFERON-TB GOLD PLUS (RQFGPL)
QuantiFERON Mitogen Value: 1.17 [IU]/mL
QuantiFERON Nil Value: 0 [IU]/mL
QuantiFERON TB1 Ag Value: 0 [IU]/mL
QuantiFERON TB2 Ag Value: 0 [IU]/mL

## 2024-02-08 LAB — MAGNESIUM: Magnesium: 2 mg/dL (ref 1.7–2.4)

## 2024-02-08 LAB — ACID FAST SMEAR (AFB, MYCOBACTERIA): Acid Fast Smear: NEGATIVE

## 2024-02-08 LAB — C-REACTIVE PROTEIN: CRP: 14.6 mg/dL — ABNORMAL HIGH (ref <1.0)

## 2024-02-08 LAB — HEPARIN LEVEL (UNFRACTIONATED): Heparin Unfractionated: 0.3 [IU]/mL (ref 0.30–0.70)

## 2024-02-08 MED ORDER — IOHEXOL 350 MG/ML SOLN
75.0000 mL | Freq: Once | INTRAVENOUS | Status: AC | PRN
  Administered 2024-02-08 (×2): 75 mL via INTRAVENOUS

## 2024-02-08 MED ORDER — PIPERACILLIN-TAZOBACTAM 3.375 G IVPB 30 MIN
3.3750 g | Freq: Three times a day (TID) | INTRAVENOUS | Status: DC
  Administered 2024-02-08 – 2024-02-24 (×55): 3.375 g via INTRAVENOUS
  Filled 2024-02-08 (×94): qty 50

## 2024-02-08 MED ORDER — POTASSIUM CHLORIDE CRYS ER 20 MEQ PO TBCR
40.0000 meq | EXTENDED_RELEASE_TABLET | ORAL | Status: AC
  Administered 2024-02-08 (×4): 40 meq via ORAL
  Filled 2024-02-08 (×2): qty 2

## 2024-02-08 NOTE — Plan of Care (Signed)

## 2024-02-08 NOTE — Progress Notes (Signed)
 Progress Note   Patient: Bradley Ferguson FMW:[id number] DOB: January 19, 1951 DOA: 02/03/2024  DOS: the patient was seen and examined on 02/08/2024   Brief hospital course:  73 y.o. male with medical history significant of patient with history of COPD, interstitial lung disease, patient has latent tuberculosis, atrial fibrillation on Tikosyn  and Xarelto , peripheral arterial disease, essential hypertension, anxiety disorder depression, history of hematuria with lung consolidation was on antibiotics by pulmonary.  Patient went to see pulmonary today due to near syncope and productive cough.  He was found to have significant dyspnea and tachycardia with heart rate in the 120s.  Patient noted to have oxygen saturation 90% on room air.  He was however noted to have hypercalcemia, leukocytosis with left shift and lactic acidosis.    Assessment and Plan:   Left upper lobe consolidation/mass with concern for AFB - CT 8/1 noting slightly increasing size of LUL dense consolidation concerning for infectious versus malignancy.  Worsening cough with hemoptysis on presentation.  Neurology consulted and following closely.  Given patient's history of latent TB, AFB ordered from sputum x 3 (completed last collection 8/9).  So far AFB negative.  Other differentials include postobstructive pneumonia or primary lung adenocarcinoma.  Currently on negative pressure isolation.  Empiric ceftriaxone  plus p.o. Doxy on board (day 6 of 7).  Pending AFB/TB rule out from sputum, may pursue bronchoscopy later this week.   Hypercalcemia - Greater than 15 on presentation.  Combination with LUL mass, concern for primary carcinoma.  PTH appropriately low.  Pending PTHRP.  Aggressive IV fluid hydration.  Despite IV fluids and IV Lasix , showed increase up to 13.3 this morning.  Yesterday 8/10 received NS bolus, increase Lasix  to every 8 hours, and zoledronic  acid x 1.  Showed mild improvement this morning down to 12.9.  Will recheck calcium  in  AM.   Combined pulmonary fibrosis and emphysema - Currently not hypoxic but likely contributing to underlying dyspnea.  Nebulizers on board.   Persistent atrial fibrillation - Resume home medication regimen.  Transition to heparin  drip.   Concern for severe sepsis - Leukocytosis, lactate greater than 3, tachycardia on presentation given concern for severe sepsis.  Blood cultures, IV fluid bolus, empiric antibiotics on board.  Lactate resolved.  Still has remarkable leukocytosis.  Continue to monitor blood pressure closely.   Bipolar 1 disorder - Continue home regimen   Subjective: Patient resting comfortably.  States he feels little bit better from yesterday.  Has intermittent coughing but appears less productive this morning.  Denies any fever, chills, chest pain, nausea, vomiting, abdominal pain.  Physical Exam:  Vitals:   02/07/24 2046 02/08/24 0006 02/08/24 0426 02/08/24 0756  BP: 124/77 109/76 102/69 118/65  Pulse: 82 68 86 93  Resp: 18 20 18 18   Temp: 98 F (36.7 C) 98.5 F (36.9 C) 98.2 F (36.8 C) (!) 97.3 F (36.3 C)  TempSrc: Oral Oral Oral Oral  SpO2: 90% 96% 99% 93%  Weight:   67.9 kg   Height:        GENERAL:  Alert, pleasant, no acute distress, disheveled HEENT:  EOMI CARDIOVASCULAR: Irregularly irregular RESPIRATORY: Poor air movement bilaterally, no wheezing GASTROINTESTINAL:  Soft, nontender, nondistended EXTREMITIES:  No LE edema bilaterally NEURO:  No new focal deficits appreciated SKIN:  No rashes noted PSYCH:  Appropriate mood and affect    Data Reviewed:  Imaging Studies: DG Chest 2 View Result Date: 02/03/2024 CLINICAL DATA:  Shortness of breath.  Tachycardia. EXAM: CHEST - 2 VIEW COMPARISON:  12/29/2023 and CT chest 01/29/2024. FINDINGS: The trachea is midline. Heart size stable. Cavitary masslike consolidation in the left upper lobe has progressed from 12/29/2023. Associated pleural thickening and pleural fluid in the lateral and lower left  hemithorax. Mild coarsening of the peripheral pulmonary markings. IMPRESSION: 1. Worsening cavitary masslike consolidation in the left upper lobe, worrisome for primary bronchogenic carcinoma. Pneumonia is not excluded. 2. Partially loculated small left pleural effusion and/or thickening. 3. Interstitial lung disease, better seen on 01/29/2024. Electronically Signed   By: Newell Eke M.D.   On: 02/03/2024 16:32   CT CHEST WO CONTRAST Result Date: 02/03/2024 CLINICAL DATA:  Pneumonia, complications suspected, x-ray done. * Tracking Code: BO * EXAM: CT CHEST WITHOUT CONTRAST TECHNIQUE: Multidetector CT imaging of the chest was performed following the standard protocol without IV contrast. RADIATION DOSE REDUCTION: This exam was performed according to the departmental dose-optimization program which includes automated exposure control, adjustment of the mA and/or kV according to patient size and/or use of iterative reconstruction technique. COMPARISON:  CT December 29, 2023 and June 04, 2021 FINDINGS: Cardiovascular: Aortic atherosclerosis. Three-vessel coronary artery calcifications/stents. Calcifications of the aortic valve and annulus. Calcifications of the mitral annulus. Normal size heart. No significant pericardial effusion/thickening. Mediastinum/Nodes: No suspicious thyroid  nodule. Prominent mediastinal lymph nodes are similar prior for instance an AP window lymph node measuring 9 mm in short axis on image 56/301 is unchanged. The esophagus is grossly unremarkable. Lungs/Pleura: Persistent dense consolidation with air bronchograms in the left upper lobe measuring 7.5 x 5.6 cm on image 46/302 previously 7.0 x 5.6 cm. Increased size of the adjacent pleural effusion/pleural thickening. Additional consolidative nodularity in the left lower lobe has increased from prior for instance measuring 17 x 10 mm on image 73/302 previously 14 x 7 mm. Similar findings of basilar predominant interstitial lung disease. Upper  Abdomen: No acute abnormality. Musculoskeletal: No aggressive lytic or blastic lesion of bone. Multilevel degenerative changes spine. Diffuse demineralization of bone. IMPRESSION: 1. Slight increase in size of the dense mass like consolidation with air bronchograms in the left upper lobe with increased adjacent pleural effusion/pleural thickening and increased size of additional consolidative nodularity in the left lower lobe. Findings are nonspecific and may reflect an infectious/inflammatory process with possible treatment failure, however underlying malignancy is a pertinent differential consideration warranting further evaluation. Suggest pulmonary consultation with consideration for direct tissue sampling and/or nuclear medicine PET-CT. 2. Prominent mediastinal lymph nodes are nonspecific but similar prior suggest continued attention on follow-up imaging. 3. Similar findings of basilar predominant interstitial lung disease. 4. Aortic atherosclerosis. Aortic Atherosclerosis (ICD10-I70.0). Electronically Signed   By: Reyes Holder M.D.   On: 02/03/2024 15:06   VAS US  LOWER EXTREMITY ARTERIAL DUPLEX Result Date: 01/19/2024 LOWER EXTREMITY ARTERIAL DUPLEX STUDY Patient Name:  NAVID LENZEN  Date of Exam:   01/19/2024 Medical Rec #: [id number]       Accession #:    [id number] Date of Birth: 1950-07-09       Patient Gender: M Patient Age:   38 years Exam Location:  [address] Procedure:      VAS US  LOWER EXTREMITY ARTERIAL DUPLEX Referring Phys: [facility] --------------------------------------------------------------------------------  Indications: Peripheral artery disease. High Risk Factors: Hypertension, hyperlipidemia, current smoker.  Vascular Interventions: Successful diamondback orbital rotation                         arthrectomy, PT  and covered stent at a subtotal calcified proximal                         right                         common iliac artery on 04/27/2017.  Angioplasty and                         stent                         of left common and external iliac artery, and left                         common femoral endarterectomy including                         profundoplasty                         and endarterectomy the proximal SFA with bovine                         pericardial patch angioplasty on 09/02/2023. Current ABI:            0.5/0.54 Comparison Study: 04/09/17 Performing Technologist: Garnette Rockers  Examination Guidelines: A complete evaluation includes B-mode imaging, spectral Doppler, color Doppler, and power Doppler as needed of all accessible portions of each vessel. Bilateral testing is considered an integral part of a complete examination. Limited examinations for reoccurring indications may be performed as noted.  +-----------+--------+-----+---------------+----------+-----------------------+ RIGHT      PSV cm/sRatioStenosis       Waveform  Comments                +-----------+--------+-----+---------------+----------+-----------------------+ CFA Distal 27                          biphasic                          +-----------+--------+-----+---------------+----------+-----------------------+ DFA        64           50-74% stenosismonophasic                        +-----------+--------+-----+---------------+----------+-----------------------+ SFA Prox                occluded                                         +-----------+--------+-----+---------------+----------+-----------------------+ SFA Mid                 occluded                                         +-----------+--------+-----+---------------+----------+-----------------------+ SFA Distal              occluded                                         +-----------+--------+-----+---------------+----------+-----------------------+  POP Prox   19                          monophasicReconstitution from DFA  +-----------+--------+-----+---------------+----------+-----------------------+ POP Distal 27                          monophasic                        +-----------+--------+-----+---------------+----------+-----------------------+ ATA Distal 19                          monophasic                        +-----------+--------+-----+---------------+----------+-----------------------+ PTA Distal 11                          monophasic                        +-----------+--------+-----+---------------+----------+-----------------------+ PERO Distal19                          monophasic                        +-----------+--------+-----+---------------+----------+-----------------------+ DFA Mid - 90 cm/s - monophasic DFA Dst - 51 cm/s - monophasic.  Summary: Right: 50-74% stenosis noted in the deep femoral artery. Total occlusion noted in the superficial femoral artery.  See table(s) above for measurements and observations. Electronically signed by Lonni Gaskins MD on 01/19/2024 at 12:31:40 PM.    Final    VAS US  ABI WITH/WO TBI Result Date: 01/19/2024  LOWER EXTREMITY DOPPLER STUDY Patient Name:  AKSEL BENCOMO  Date of Exam:   01/19/2024 Medical Rec #: [id number]       Accession #:    [id number] Date of Birth: 08-31-1950       Patient Gender: M Patient Age:   38 years Exam Location:  [address] Procedure:      VAS US  ABI WITH/WO TBI Referring Phys: [facility] --------------------------------------------------------------------------------  Indications: Peripheral artery disease. High Risk Factors: Hypertension, hyperlipidemia, current smoker.  Vascular Interventions: Successful diamondback orbital rotation                         arthrectomy, PT                         and covered stent at a subtotal calcified proximal                         right                         common iliac artery on 04/27/2017. Angioplasty and                         stent                         of  left common and external iliac artery, and left  common femoral endarterectomy including                         profundoplasty                         and endarterectomy the proximal SFA with bovine                         pericardial patch angioplasty on 09/02/2023. Comparison Study: 10/13/23 Performing Technologist: Garnette Rockers  Examination Guidelines: A complete evaluation includes at minimum, Doppler waveform signals and systolic blood pressure reading at the level of bilateral brachial, anterior tibial, and posterior tibial arteries, when vessel segments are accessible. Bilateral testing is considered an integral part of a complete examination. Photoelectric Plethysmograph (PPG) waveforms and toe systolic pressure readings are included as required and additional duplex testing as needed. Limited examinations for reoccurring indications may be performed as noted.  ABI Findings: +---------+------------------+-----+-------------------+--------+ Right    Rt Pressure (mmHg)IndexWaveform           Comment  +---------+------------------+-----+-------------------+--------+ Brachial 139                                                +---------+------------------+-----+-------------------+--------+ PTA      53                0.38 dampened monophasic         +---------+------------------+-----+-------------------+--------+ DP       70                0.50 dampened monophasic         +---------+------------------+-----+-------------------+--------+ Great Toe0                 0.00 Absent                      +---------+------------------+-----+-------------------+--------+ +---------+------------------+-----+----------+-------+ Left     Lt Pressure (mmHg)IndexWaveform  Comment +---------+------------------+-----+----------+-------+ Brachial 139                                      +---------+------------------+-----+----------+-------+ PTA      75                 0.54 monophasic        +---------+------------------+-----+----------+-------+ DP       66                0.47 monophasic        +---------+------------------+-----+----------+-------+ Great Toe34                0.24 Abnormal          +---------+------------------+-----+----------+-------+ +-------+-----------+-----------+------------+------------+ ABI/TBIToday's ABIToday's TBIPrevious ABIPrevious TBI +-------+-----------+-----------+------------+------------+ Right  0.5        0.0        0.28        0.0          +-------+-----------+-----------+------------+------------+ Left   0.54       0.24       0.64        0.3          +-------+-----------+-----------+------------+------------+  Summary: Right: Resting right ankle-brachial index indicates moderate right lower extremity arterial disease. The right toe-brachial index  is abnormal. Left: Resting left ankle-brachial index indicates moderate left lower extremity arterial disease. The left toe-brachial index is abnormal. *See table(s) above for measurements and observations.  Electronically signed by Lonni Gaskins MD on 01/19/2024 at 11:19:29 AM.    Final     There are no new results to review at this time.  Previous records (including but not limited to H&P, progress notes, nursing notes, TOC management) were reviewed in assessment of this patient.  Labs: CBC: Recent Labs  Lab 02/03/24 1548 02/03/24 2044 02/03/24 2339 02/05/24 0841 02/06/24 0234 02/07/24 0300 02/08/24 0315  WBC 21.7*   < > 18.0* 15.4* 17.9* 21.8* 21.6*  NEUTROABS 19.3*  --   --   --   --   --   --   HGB 11.3*   < > 8.7* 9.2* 9.1* 9.4* 9.8*  HCT 35.7*   < > 26.8* 28.7* 29.0* 29.3* 31.5*  MCV 81.3   < > 81.0 81.5 81.5 80.3 81.6  PLT 354   < > 284 258 272 319 329   < > = values in this interval not displayed.   Basic Metabolic Panel: Recent Labs  Lab 02/03/24 1723 02/03/24 1812 02/03/24 2339 02/05/24 0841 02/06/24 0234  02/07/24 0300 02/08/24 0315  NA  --    < > 139 138 137 139 140  K  --    < > 3.2* 3.5 4.1 3.2* 3.7  CL  --    < > 104 103 107 103 101  CO2  --    < > 27 24 23 25 29   GLUCOSE  --    < > 124* 90 109* 126* 136*  BUN  --    < > 28* 16 13 12 18   CREATININE  --    < > 0.91 0.82 0.89 0.94 1.12  CALCIUM   --    < > 12.4* 12.8* 12.8* 13.3* 12.9*  MG  --   --   --  1.6* 2.1 1.6* 2.0  PHOS 2.5  --   --   --   --   --   --    < > = values in this interval not displayed.   Liver Function Tests: Recent Labs  Lab 02/03/24 1548 02/03/24 2339  AST 22 19  ALT 12 12  ALKPHOS 116 70  BILITOT 0.6 0.5  PROT 7.4 5.4*  ALBUMIN  3.7 2.2*   CBG: No results for input(s): GLUCAP in the last 168 hours.  Scheduled Meds:  bisacodyl   5 mg Oral Once   budesonide -glycopyrrolate -formoterol   2 puff Inhalation BID   cholecalciferol   10,000 Units Oral QPM   dofetilide   500 mcg Oral BID   doxycycline   100 mg Oral Q12H   furosemide   60 mg Intravenous Q8H   gabapentin   300 mg Oral TID   polyethylene glycol  17 g Oral Daily   potassium chloride   40 mEq Oral Q4H   pyridOXINE   100 mg Oral QPM   sorbitol   30 mL Oral Once   Continuous Infusions:  sodium chloride  125 mL/hr at 02/08/24 0002   cefTRIAXone  (ROCEPHIN )  IV 1 g (02/07/24 1708)   heparin  1,650 Units/hr (02/08/24 0847)   PRN Meds:.albuterol , bisacodyl , bisacodyl , guaiFENesin -dextromethorphan , HYDROcodone -acetaminophen , ketoconazole , triamcinolone  cream  Family Communication: None at bedside  Disposition: Status is: Inpatient Remains inpatient appropriate because: Cavitary pneumonia, mass     Time spent: 35 minutes  Length of inpatient stay: 5 days  Author: Carliss LELON Canales, DO 02/08/2024 10:53 AM  For on call review [email or web address].

## 2024-02-08 NOTE — Progress Notes (Signed)
 PHARMACY - ANTICOAGULATION  Pharmacy Consult for heparin  Indication: atrial fibrillation   Allergies  Allergen Reactions   Cymbalta  [Duloxetine  Hcl]     unknown   Effexor [Venlafaxine]     Feels bad   Wellbutrin  [Bupropion ]     sleepy   Zoloft [Sertraline Hcl]     Felt bad    Patient Measurements: Height: 6' 0.99 (185.4 cm) Weight: 67.9 kg (149 lb 11.1 oz) IBW/kg (Calculated) : 79.88 HEPARIN  DW (KG): 65.2  Vital Signs: Temp: 97.3 F (36.3 C) (08/11 0756) Temp Source: Oral (08/11 0756) BP: 118/65 (08/11 0756) Pulse Rate: 93 (08/11 0756)  Labs: Recent Labs    02/06/24 0234 02/07/24 0300 02/07/24 1257 02/08/24 0315  HGB 9.1* 9.4*  --  9.8*  HCT 29.0* 29.3*  --  31.5*  PLT 272 319  --  329  HEPARINUNFRC 0.31 0.23* 0.29* 0.30  CREATININE 0.89 0.94  --  1.12    Estimated Creatinine Clearance: 57.3 mL/min (by C-G formula based on SCr of 1.12 mg/dL).   Assessment: 74 y.o. male with h/o Afib, PTA Xarelto  on hold (patient reported not taking anymore but fill history shows recent fills). Heparin  consult.  Heparin  level 0.3 today   Goal of Therapy:  Heparin  level 0.3-0.7 units/ml Monitor platelets by anticoagulation protocol: Yes   Plan:  Increase heparin  to 1650 units/hr to prevent < 0.3 Monitor daily heparin  level and CBC Monitor H&H, plts, signs and symptoms of bleeding  Thank you. Olam Monte, PharmD 02/08/2024 8:05 AM

## 2024-02-08 NOTE — Consult Note (Addendum)
 ADMISSION DATE:  02/03/2024  SUBJECTIVE: 73 year old male with a past medical history of limited systemic sclerosis, COPD, ILD, A-fib, latent TB who is presenting to the hospital for further evaluation of syncopal episodes associated with tachycardia, dizziness, lightheadedness and unresolved pneumonia.  Dx w LTBI in 2021, not treated. Screened prior to initiation of Imuran.   Smoker. 1 pk/day. Almost 50 years.   P/w cough x 1 month w 20 kg weight loss.   Imaging  CT chest Aug 2025: reviewed by me. Has a LUL mass measuring 7.5x 5.6 cm. Additional nodule in the LLL. 4L station LN seen. Underlying basal and paraseptal bronchieclatasis and fibrotic changes. No significant GGO.  CT LDCT 2022: did not show LUL mass.  CT July 2025: mass in LUL 4.6x6 cm. LLL smaller than Aug.   REVIEW OF SYSTEMS:   Pertinent ROS as per HPI  VITAL SIGNS: Temp:  [97.3 F (36.3 C)-98.5 F (36.9 C)] 98 F (36.7 C) (08/11 1152) Pulse Rate:  [68-93] 93 (08/11 0756) Resp:  [18-20] 18 (08/11 0756) BP: (102-124)/(65-77) 111/75 (08/11 1152) SpO2:  [90 %-99 %] 93 % (08/11 0756) Weight:  [67.9 kg] 67.9 kg (08/11 0426)  PHYSICAL EXAMINATION: General: cachetic.  Neuro:  axo3 Cardiovascular. RRR no murmur.  Lungs:  lung clear Abdomen:  NT ND  Recent Labs  Lab 02/06/24 0234 02/07/24 0300 02/08/24 0315  NA 137 139 140  K 4.1 3.2* 3.7  CL 107 103 101  CO2 23 25 29   BUN 13 12 18   CREATININE 0.89 0.94 1.12  GLUCOSE 109* 126* 136*   Recent Labs  Lab 02/06/24 0234 02/07/24 0300 02/08/24 0315  HGB 9.1* 9.4* 9.8*  HCT 29.0* 29.3* 31.5*  WBC 17.9* 21.8* 21.6*  PLT 272 319 329   No results found.  STUDIES:  CXR/CT scan/ECHO  CT chest Aug 2025: reviewed by me. Has a LUL mass measuring 7.5x 5.6 cm. Additional nodule in the LLL. 4L station LN seen. Underlying basal and paraseptal bronchieclatasis and fibrotic changes. No significant GGO.  CT LDCT 2022: did not show LUL mass.  CT July 2025: mass in  LUL 4.6x6 cm. LLL smaller than Aug.   ASSESSMENT / PLAN:  LUL mass w LLL nodule ILD likely 2/2 to limited SS  Differential: Active TB vs fungal infection vs malignancy vs bacterial infection.  W neg quant and 3 sputum so far being neg less likely to be active TB. Malignancy is high on list. Bacterial PNA seems less likely as patient had not been on antibiotics in July and did not become septic despite significant disease burden. Hypercalcemia likely suggestive of squamous cell lung cancer or less likely fungal infection.  There is suggestion of bronchorrhea, ?  Mucinous adenocarcinoma  - Abx changed to zosyn  to cover for cavitary PNA. Cont doxy.  - blasto, histo, fungitell, galactomanan - MRSA, legionella and strep PNA.  - ESR and CRP.  - AFB and sputum cultures pending.  - will need tissue biopsy to rule out cancer. Scheduled for outpatient bronch on 8/19 with Dr Lamar Chris.  I discussed the case at length with Dr. Chris.  He has agreed to do the procedure. - will follow the patient for ILD management as oupt. No changes to the regimen for now. Can continue w home breztri .  - currently on heparin  gtt. Will need to hold [facility] prior to procedure.   Sammi JONETTA Fredericks, MD Pulmonary, Critical Care & Sleep Medicine [facility] Pager: [phone]  02/08/2024,  2:08 PM

## 2024-02-08 NOTE — TOC Progression Note (Addendum)
 Transition of Care [facility]) - Progression Note    Patient Details  Name: Bradley Ferguson MRN: [id number] Date of Birth: 1951-02-16  Transition of Care [facility]) CM/SW Contact  [name], [place] Phone Number: 02/08/2024, 10:59 AM  Clinical Narrative:   Per daily meeting with treatment team, pt will have a bronchoscopy later this week. Pending AFB/TB rule out. CSW notified [facility] and will start insurance auth closer to when patient is more medically stable.   11:02 AM PASRR approved [id number] ; approval dates 02/08/2024-03/09/2024  CSW will continue to follow.    Expected Discharge Plan: Skilled Nursing Facility Barriers to Discharge: Continued Medical Work up, SNF Pending bed offer, Insurance Authorization               Expected Discharge Plan and Services In-house Referral: Clinical Social Work     Living arrangements for the past 2 months: Single Family Home                                       Social Drivers of Health (SDOH) Interventions SDOH Screenings   Food Insecurity: Food Insecurity Present (02/03/2024)  Housing: Low Risk  (02/03/2024)  Transportation Needs: No Transportation Needs (02/03/2024)  Utilities: Not At Risk (02/03/2024)  Alcohol Screen: Low Risk  (12/29/2023)  Depression (PHQ2-9): Medium Risk (11/13/2023)  Financial Resource Strain: Medium Risk (12/29/2023)  Physical Activity: Inactive (12/29/2023)  Social Connections: Socially Isolated (02/03/2024)  Stress: Stress Concern Present (12/29/2023)  Tobacco Use: High Risk (02/03/2024)  Health Literacy: Adequate Health Literacy (11/13/2023)    Readmission Risk Interventions    02/04/2024    3:23 PM 11/10/2023    2:54 PM  Readmission Risk Prevention Plan  Post Dischage Appt  Complete  Medication Screening  Complete  Transportation Screening Complete Complete  PCP or Specialist Appt within 5-7 Days Complete   Home Care Screening Complete   Medication Review (RN CM) Complete

## 2024-02-09 DIAGNOSIS — I480 Paroxysmal atrial fibrillation: Secondary | ICD-10-CM | POA: Diagnosis not present

## 2024-02-09 DIAGNOSIS — A419 Sepsis, unspecified organism: Secondary | ICD-10-CM | POA: Diagnosis not present

## 2024-02-09 DIAGNOSIS — R911 Solitary pulmonary nodule: Secondary | ICD-10-CM | POA: Diagnosis not present

## 2024-02-09 DIAGNOSIS — J849 Interstitial pulmonary disease, unspecified: Secondary | ICD-10-CM | POA: Diagnosis not present

## 2024-02-09 DIAGNOSIS — C3492 Malignant neoplasm of unspecified part of left bronchus or lung: Secondary | ICD-10-CM

## 2024-02-09 LAB — BASIC METABOLIC PANEL WITH GFR
Anion gap: 10 (ref 5–15)
BUN: 18 mg/dL (ref 8–23)
CO2: 33 mmol/L — ABNORMAL HIGH (ref 22–32)
Calcium: 11.4 mg/dL — ABNORMAL HIGH (ref 8.9–10.3)
Chloride: 94 mmol/L — ABNORMAL LOW (ref 98–111)
Creatinine, Ser: 1.07 mg/dL (ref 0.61–1.24)
GFR, Estimated: >60 mL/min (ref >60)
Glucose, Bld: 99 mg/dL (ref 70–99)
Potassium: 3.4 mmol/L — ABNORMAL LOW (ref 3.5–5.1)
Sodium: 137 mmol/L (ref 135–145)

## 2024-02-09 LAB — CBC
HCT: 33.1 % — ABNORMAL LOW (ref 39.0–52.0)
Hemoglobin: 10.3 g/dL — ABNORMAL LOW (ref 13.0–17.0)
MCH: 25.4 pg — ABNORMAL LOW (ref 26.0–34.0)
MCHC: 31.1 g/dL (ref 30.0–36.0)
MCV: 81.7 fL (ref 80.0–100.0)
Platelets: 311 K/uL (ref 150–400)
RBC: 4.05 MIL/uL — ABNORMAL LOW (ref 4.22–5.81)
RDW: 18.8 % — ABNORMAL HIGH (ref 11.5–15.5)
WBC: 18.9 K/uL — ABNORMAL HIGH (ref 4.0–10.5)
nRBC: 0 % (ref 0.0–0.2)

## 2024-02-09 LAB — MAGNESIUM: Magnesium: 1.6 mg/dL — ABNORMAL LOW (ref 1.7–2.4)

## 2024-02-09 LAB — MISC LABCORP TEST (SEND OUT)

## 2024-02-09 LAB — MRSA NEXT GEN BY PCR, NASAL: MRSA by PCR Next Gen: NOT DETECTED

## 2024-02-09 LAB — HEPARIN LEVEL (UNFRACTIONATED): Heparin Unfractionated: 0.7 [IU]/mL (ref 0.30–0.70)

## 2024-02-09 LAB — STREP PNEUMONIAE URINARY ANTIGEN: Strep Pneumo Urinary Antigen: NEGATIVE

## 2024-02-09 MED ORDER — MAGNESIUM SULFATE 2 GM/50ML IV SOLN
2.0000 g | Freq: Once | INTRAVENOUS | Status: AC
  Administered 2024-02-09 (×2): 2 g via INTRAVENOUS
  Filled 2024-02-09: qty 50

## 2024-02-09 MED ORDER — POTASSIUM CHLORIDE 20 MEQ PO PACK
40.0000 meq | PACK | Freq: Two times a day (BID) | ORAL | Status: DC
  Administered 2024-02-09 – 2024-02-10 (×6): 40 meq via ORAL
  Filled 2024-02-09 (×3): qty 2

## 2024-02-09 NOTE — Consult Note (Signed)
 ADMISSION DATE:  02/03/2024  SUBJECTIVE: 73 year old male with a past medical history of limited systemic sclerosis, COPD, ILD, A-fib, latent TB who is presenting to the hospital for further evaluation of syncopal episodes associated with tachycardia, dizziness, lightheadedness and unresolved pneumonia.  Dx w LTBI in 2021, not treated. Screened prior to initiation of Imuran.   Smoker. 1 pk/day. Almost 50 years.   P/w cough x 1 month w 20 kg weight loss.   Imaging  CT chest Aug 2025: reviewed by me. Has a LUL mass measuring 7.5x 5.6 cm. Additional nodule in the LLL. 4L station LN seen. Underlying basal and paraseptal bronchieclatasis and fibrotic changes. No significant GGO.  CT LDCT 2022: did not show LUL mass.  CT July 2025: mass in LUL 4.6x6 cm. LLL smaller than Aug.   Today: doing similar. CT abdomen showing left psoas muscle nodule. IR can do biopsy if needed. Also, worsening left sided effusion seen compared to prior CT chest.   REVIEW OF SYSTEMS:   Pertinent ROS as per HPI  VITAL SIGNS: Temp:  [97.2 F (36.2 C)-98.5 F (36.9 C)] 97.2 F (36.2 C) (08/12 1100) Pulse Rate:  [69-97] 97 (08/12 1100) Resp:  [14-20] 14 (08/12 1100) BP: (96-114)/(57-80) 114/80 (08/12 1100) SpO2:  [94 %-98 %] 95 % (08/12 1100)  PHYSICAL EXAMINATION: General: cachetic.  Neuro:  axo3 Cardiovascular. RRR no murmur.  Lungs:  lung clear Abdomen:  NT ND  Recent Labs  Lab 02/07/24 0300 02/08/24 0315 02/09/24 0240  NA 139 140 137  K 3.2* 3.7 3.4*  CL 103 101 94*  CO2 25 29 33*  BUN 12 18 18   CREATININE 0.94 1.12 1.07  GLUCOSE 126* 136* 99   Recent Labs  Lab 02/07/24 0300 02/08/24 0315 02/09/24 0240  HGB 9.4* 9.8* 10.3*  HCT 29.3* 31.5* 33.1*  WBC 21.8* 21.6* 18.9*  PLT 319 329 311   CT ABDOMEN PELVIS W CONTRAST Result Date: 02/09/2024 CLINICAL DATA:  Persistent left upper lobe consolidative mass and left lower lobe nodule, suspicious for malignancy. Staging. * Tracking Code: BO *  EXAM: CT ABDOMEN AND PELVIS WITH CONTRAST TECHNIQUE: Multidetector CT imaging of the abdomen and pelvis was performed using the standard protocol following bolus administration of intravenous contrast. RADIATION DOSE REDUCTION: This exam was performed according to the departmental dose-optimization program which includes automated exposure control, adjustment of the mA and/or kV according to patient size and/or use of iterative reconstruction technique. CONTRAST:  75mL OMNIPAQUE  IOHEXOL  350 MG/ML SOLN COMPARISON:  CTA abdomen and pelvis dated 05/29/2023, CT chest dated 01/29/2024 FINDINGS: Lower chest: Partially imaged posterior left upper lobe mass-like consolidation extending to the hilum and encasing the left upper lobe airways. Interval increased left lower lobe volume loss with suggestion of heterogeneous consolidation. Partially imaged fibrotic changes of the right lung. Increased left pleural effusion, which appears at least partially loculated. Partially imaged heterogeneously enhancing, nodular pleural thickening, for example 5.5 x 1.7 cm at the left lung base (3:28). Partially imaged multichamber cardiomegaly. Partially imaged mediastinal lymphadenopathy, which appears slightly increased in size, for example 12 mm (3: 1), previously 9 mm. Coronary artery calcifications. Hepatobiliary: Ill-defined hypoattenuating focus adjacent to the falciform ligament in segment 4 measuring 2.0 x 1.4 cm (3:45) is in a location typical for focal fatty infiltration, however is new from 05/29/2023. Unchanged subcentimeter segment 2 hypodensities, too small to characterize, but likely cysts. No intra or extrahepatic biliary ductal dilation. Normal gallbladder. Pancreas: No focal lesions or main ductal dilation.  Spleen: Normal in size without focal abnormality. Adrenals/Urinary Tract: No adrenal nodules. No suspicious renal mass or hydronephrosis. 2 mm hyperattenuating focus within the proximal left ureter (3:57) without  obstruction. No focal bladder wall thickening. Stomach/Bowel: Normal appearance of the stomach. No evidence of bowel wall thickening, distention, or inflammatory changes. Colonic diverticulosis without acute diverticulitis. Normal appendix. Vascular/Lymphatic: Aortic atherosclerosis. Infrarenal abdominal aortic aneurysm measures 3.2 x 3.1 cm, unchanged. Similar occlusion of the proximal left femoral artery. Prominent left retrocrural lymph node measures 7 mm (3:34). Reproductive: Enlargement of the prostate with median lobe hypertrophy. Other: No free fluid, fluid collection, or free air. New, heterogeneously enhancing nodule lateral to the left psoas muscle measures 1.3 x 1.2 cm (3:63). Musculoskeletal: No acute or abnormal lytic or blastic osseous lesions. IMPRESSION: 1. Partially imaged posterior left upper lobe mass-like consolidation extending to the hilum and encasing the left upper lobe airways, highly suspicious for malignancy. Interval increased left lower lobe volume loss with suggestion of heterogeneous consolidation, which may reflect atelectasis or postobstructive pneumonia. 2. Increased left pleural effusion, which appears at least partially loculated. Partially imaged heterogeneously enhancing, nodular pleural thickening, suspicious for pleural metastases. 3. Partially imaged mediastinal lymphadenopathy, which appears slightly increased in size, suspicious for metastasis. 4. New, heterogeneously enhancing nodule lateral to the left psoas muscle measures 1.3 x 1.2 cm, suspicious for metastatic disease. 5. Ill-defined hypoattenuating focus adjacent to the falciform ligament in hepatic segment 4 is in a location typical for focal fatty infiltration, however is new from 05/29/2023, indeterminate. 6. A 2 mm hyperattenuating focus within the proximal left ureter without obstruction, which may represent a nonobstructing calculus. 7. Unchanged infrarenal abdominal aortic aneurysm measures 3.2 cm. 8. Aortic  Atherosclerosis (ICD10-I70.0). Coronary artery calcifications. Assessment for potential risk factor modification, dietary therapy or pharmacologic therapy may be warranted, if clinically indicated. Electronically Signed   By: Limin  Xu M.D.   On: 02/09/2024 08:55    STUDIES:  CXR/CT scan/ECHO  CT chest Aug 2025: reviewed by me. Has a LUL mass measuring 7.5x 5.6 cm. Additional nodule in the LLL. 4L station LN seen. Underlying basal and paraseptal bronchieclatasis and fibrotic changes. No significant GGO.  CT LDCT 2022: did not show LUL mass.  CT July 2025: mass in LUL 4.6x6 cm. LLL smaller than Aug.  CT abdomen Aug 2025: left Psoas muscle lesion (per radiologist concerning for met) and worsening left sided effusion.   ASSESSMENT / PLAN:  LUL mass w LLL nodule ILD likely 2/2 to limited SS Left Effusion.  Left psoas muscle lesion r/o malignant nodule.   LUL mass concerning for malignancy as outlined on my note from 8/11. Left effusion could be 2/2 to malignancy.   POCUS on 8/12 showing left sided simple mod-large effusion.   - Cont zosyn  and doxy.  - blasto, histo, fungitell, galactomanan- pending.  - MRSA, strep PNA- neg. Legionella pending.  - ESR and CRP- elevated.  - AFB and sputum cultures pending.  - plan for thoracentesis tomorrow. If positive for malignant cells and enough tissue on cell block may not need further investigation. If neg for malignant cells may need to target left psoas muscle lesion for biopsy.  - tentatively planned for robotic bronch on 8/19 w Dr Shelah.  - will follow the patient for ILD management as oupt. No changes to the regimen for now. Can continue w home breztri .  - currently on heparin  gtt. Hold heparin  gtt at 7 am tomorrow for thoracentesis.   Sammi JONETTA Fredericks, MD Pulmonary,  Critical Care & Sleep Medicine [facility] Pager: [phone]  02/09/2024, 3:58 PM

## 2024-02-09 NOTE — Plan of Care (Signed)

## 2024-02-09 NOTE — Plan of Care (Signed)
   Problem: Health Behavior/Discharge Planning: Goal: Ability to manage health-related needs will improve Outcome: Progressing   Problem: Nutrition: Goal: Adequate nutrition will be maintained Outcome: Progressing   Problem: Coping: Goal: Level of anxiety will decrease Outcome: Progressing

## 2024-02-09 NOTE — Significant Event (Signed)
 CT abdomen and pelvis reading reviewed. There is a >1 cm left psoas muscle lesion concerning for metastasis. Communicated with the primary team to consider discussion with gen surgery if this area is amenable for excision biopsy. If this excision biopsy positive for malignancy with lung markers then patient will not need robot bronch. However, if this approach to obtain biopsy is riskier then we would proceed with bronch.   ESR and CRP elevated. Fungal studies still pending.   Pulm will follow peripherally for now but will be available questions. Will see back once all the studies are back.

## 2024-02-09 NOTE — Evaluation (Signed)
 Clinical/Bedside Swallow Evaluation Patient Details  Name: Bradley Ferguson MRN: [id number] Date of Birth: [birth year]  Today's Date: 02/09/2024 Time: SLP Start Time (ACUTE ONLY): 0912 SLP Stop Time (ACUTE ONLY): 0925 SLP Time Calculation (min) (ACUTE ONLY): 13 min  Past Medical History:  Past Medical History:  Diagnosis Date   Anxiety    Atrial fibrillation (HCC)    Blood transfusion without reported diagnosis    Cataract    Depression    Emphysema of lung (HCC)    Heart murmur    Hyperlipidemia    Hypertension    PVD (peripheral vascular disease) (HCC)    Raynaud's syndrome    Substance abuse (HCC)    Tobacco   Systemic sclerosis (HCC)    TB lung, latent 12/19/2019   s/p INH x 3 months   Transaminitis 06/18/2020   Past Surgical History:  Past Surgical History:  Procedure Laterality Date   ABDOMINAL AORTOGRAM W/LOWER EXTREMITY Left 07/09/2023   Procedure: ABDOMINAL AORTOGRAM W/LOWER EXTREMITY;  Surgeon: Court Dorn PARAS, MD;  Location: [facility] IN[facility]ASIVE [facility];  Service: Cardiovascular;  Laterality: Left;   ATRIAL FIBRILLATION ABLATION     CATARACT EXTRACTION Right    COLONOSCOPY     ENDARTERECTOMY FEMORAL Left 09/02/2023   Procedure: LEFT LOWER EXTREMITY ANGIOGRAM WITH LEFT ILIAC  STENTING;  Surgeon: Gretta Lonni PARAS, MD;  Location: [facility] OR;  Service: Vascular;  Laterality: Left;   ENDARTERECTOMY FEMORAL Right 11/09/2023   Procedure: ENDARTERECTOMY, FEMORAL TO PROFUNDA;  Surgeon: Gretta Lonni PARAS, MD;  Location: [facility] OR;  Service: Vascular;  Laterality: Right;   EYE SURGERY     FEMORAL-POPLITEAL BYPASS GRAFT Left 09/02/2023   Procedure: LEFT FEMORAL ENDARTARECTOMY WITH PATCH ANGIOPLASTY;  Surgeon: Gretta Lonni PARAS, MD;  Location: [facility] OR;  Service: Vascular;  Laterality: Left;   FRACTURE SURGERY Left 1976   HERNIA REPAIR  2023   INGUINAL HERNIA REPAIR Right 03/23/2020   Procedure: OPEN RIGHT INGUINAL HERNIA REPAIR WITH MESH;  Surgeon: Gladis Cough, MD;  Location: [facility]  OR;  Service: General;  Laterality: Right;   INSERTION OF MESH Right 03/23/2020   Procedure: INSERTION OF MESH;  Surgeon: Gladis Cough, MD;  Location: [facility] OR;  Service: General;  Laterality: Right;   LOWER EXTREMITY INTERVENTION N/A 04/27/2017   Procedure: LOWER EXTREMITY INTERVENTION;  Surgeon: Court Dorn PARAS, MD;  Location: [facility];  Service: Cardiovascular;  Laterality: N/A;   PATCH ANGIOPLASTY Right 11/09/2023   Procedure: ANGIOPLASTY, USING PATCH GRAFT, USING XENOSURE 1X14CM;  Surgeon: Gretta Lonni PARAS, MD;  Location: [facility] OR;  Service: Vascular;  Laterality: Right;   PERIPHERAL VASCULAR ATHERECTOMY Right 04/27/2017   Procedure: PERIPHERAL VASCULAR ATHERECTOMY;  Surgeon: Court Dorn PARAS, MD;  Location: [facility] INVASIVE CV LAB;  Service: Cardiovascular;  Laterality: Right;  Iliac   PERIPHERAL VASCULAR INTERVENTION Right 04/27/2017   Procedure: PERIPHERAL VASCULAR INTERVENTION;  Surgeon: Court Dorn PARAS, MD;  Location: [facility];  Service: Cardiovascular;  Laterality: Right;  Iliac   HPI:  Bradley Ferguson is a 73 year old male who is presenting to the hospital for further evaluation of syncopal episodes associated with tachycardia, dizziness, lightheadedness and unresolved pneumonia.  CXR 8/6 :Worsening cavitary masslike consolidation in the left upper lobe,  worrisome for primary bronchogenic carcinoma. Pneumonia is not excluded.  Pt with a past medical history of limited systemic sclerosis, COPD, ILD, A-fib, latent TB.    Assessment / Plan / Recommendation  Clinical Impression  Pt presents with functional swallow as assessed clinically.  Pt  tolerated all consistencies trialed including straw sips of thin liquid with no clinical s/s of aspiration, and exhibited good oral clearance of solids; however pt complains of subjective difficulty with swallowing including airway intrusion.  Offered objective swallow study, but pt politely declines MBSS at this time.  Pt may  continue current diet.  SLP will follow for tolerance/pt desire for instrumental assessment.    Recommend regular texture diet with thin liquids.   SLP Visit Diagnosis: Dysphagia, unspecified (R13.10)    Aspiration Risk  No limitations    Diet Recommendation Regular;Thin liquid    Liquid Administration via: Cup;Straw Medication Administration:  (As tolerated) Compensations: Slow rate;Small sips/bites Postural Changes: Seated upright at 90 degrees    Other  Recommendations Oral Care Recommendations: Oral care BID     Assistance Recommended at Discharge  N/A  Functional Status Assessment Patient has had a recent decline in their functional status and demonstrates the ability to make significant improvements in function in a reasonable and predictable amount of time.  Frequency and Duration min 2x/week  2 weeks       Prognosis   N/A     Swallow Study   General Date of Onset: 02/03/24 HPI: Bradley Ferguson is a 73 year old male who is presenting to the hospital for further evaluation of syncopal episodes associated with tachycardia, dizziness, lightheadedness and unresolved pneumonia.  CXR 8/6 :Worsening cavitary masslike consolidation in the left upper lobe,  worrisome for primary bronchogenic carcinoma. Pneumonia is not excluded.  Pt with a past medical history of limited systemic sclerosis, COPD, ILD, A-fib, latent TB. Type of Study: Bedside Swallow Evaluation Previous Swallow Assessment: None Diet Prior to this Study: Regular;Thin liquids (Level 0) Temperature Spikes Noted: No Respiratory Status: Nasal cannula History of Recent Intubation: No Behavior/Cognition: Alert;Cooperative Oral Cavity Assessment: Within Functional Limits Oral Care Completed by SLP: No Oral Cavity - Dentition: Dentures, top;Dentures, bottom (Pt placed when he felt he needed them) Vision: Functional for self-feeding Self-Feeding Abilities: Able to feed self Patient Positioning: Upright in  bed Baseline Vocal Quality: Low vocal intensity Volitional Cough: Strong Volitional Swallow: Able to elicit    Oral/Motor/Sensory Function Overall Oral Motor/Sensory Function: Within functional limits   Ice Chips Ice chips: Not tested   Thin Liquid Thin Liquid: Within functional limits    Nectar Thick Nectar Thick Liquid: Not tested   Honey Thick Honey Thick Liquid: Not tested   Puree Puree: Within functional limits Presentation: Spoon   Solid     Solid: Within functional limits Presentation: Self Fed      Anette FORBES Grippe, MA, CCC-SLP Acute Rehabilitation Services Office: [phone] 02/09/2024,11:41 AM

## 2024-02-09 NOTE — Progress Notes (Signed)
 PHARMACY - ANTICOAGULATION  Pharmacy Consult for heparin  Indication: atrial fibrillation   Allergies  Allergen Reactions   Cymbalta  [Duloxetine  Hcl]     unknown   Effexor [Venlafaxine]     Feels bad   Wellbutrin  [Bupropion ]     sleepy   Zoloft [Sertraline Hcl]     Felt bad    Patient Measurements: Height: 6' 0.99 (185.4 cm) Weight: 67.9 kg (149 lb 11.1 oz) IBW/kg (Calculated) : 79.88 HEPARIN  DW (KG): 65.2  Vital Signs: Temp: 97.4 F (36.3 C) (08/12 0700) Temp Source: Axillary (08/12 0700) BP: 109/73 (08/12 0700) Pulse Rate: 97 (08/11 2316)  Labs: Recent Labs    02/07/24 0300 02/07/24 1257 02/08/24 0315 02/09/24 0240  HGB 9.4*  --  9.8* 10.3*  HCT 29.3*  --  31.5* 33.1*  PLT 319  --  329 311  HEPARINUNFRC 0.23* 0.29* 0.30 0.70  CREATININE 0.94  --  1.12 1.07    Estimated Creatinine Clearance: 59.9 mL/min (by C-G formula based on SCr of 1.07 mg/dL).   Assessment: 73 y.o. male with h/o Afib, PTA Xarelto  on hold (patient reported not taking anymore but fill history shows recent fills). Heparin  consult.  Heparin  level 0.7 today   Goal of Therapy:  Heparin  level 0.3-0.7 units/ml Monitor platelets by anticoagulation protocol: Yes   Plan:  Decrease heparin  to 1600 units / hr Monitor daily heparin  level and CBC Monitor H&H, plts, signs and symptoms of bleeding  Thank you. Olam Monte, PharmD 02/09/2024 8:10 AM

## 2024-02-09 NOTE — Progress Notes (Signed)
 Progress Note   Patient: Bradley Ferguson FMW:[id number] DOB: 25-Sep-1950 DOA: 02/03/2024  DOS: the patient was seen and examined on 02/09/2024   Brief hospital course:  73 y.o. male with medical history significant of patient with history of COPD, interstitial lung disease, patient has latent tuberculosis, atrial fibrillation on Tikosyn  and Xarelto , peripheral arterial disease, essential hypertension, anxiety disorder depression, history of hematuria with lung consolidation was on antibiotics by pulmonary.  Patient went to see pulmonary today due to near syncope and productive cough.  He was found to have significant dyspnea and tachycardia with heart rate in the 120s.  Patient noted to have oxygen saturation 90% on room air.  He was however noted to have hypercalcemia, leukocytosis with left shift and lactic acidosis.    Assessment and Plan:   Left upper lobe consolidation/mass with concern for AFB-ruled out - CT 8/1 noting slightly increasing size of LUL dense consolidation concerning for infectious versus malignancy.  Worsening cough with hemoptysis on presentation.  Pulmonology consulted and following closely.  Given patient's history of latent TB, AFB ordered from sputum x 3 (completed last collection 8/9).  So far AFB negative.  Empiric abx transitioned to zosyn .  Negative AFBs so unlikely active TB, precautions Dc'd.    Concern for Primary lung cancer - Left upper lobe mass with noted hypercalcemia on presentation given concern for primary lung cancer, especially squamous cell.  PTH RP pending.  CT abdomen pelvis with contrast noting left lateral psoas nodule concerning for metastasis as well.  Working to coordinate with IR and pulmonology on tissue sampling for diagnosis and possible staging.   Hypercalcemia of malignancy - Greater than 15 on presentation.  Combination with LUL mass, concern for primary carcinoma.  PTH appropriately low.  Pending PTHRP.  Aggressive IV fluid hydration.  S/p NS  bolus, increase Lasix  to every 8 hours, and zoledronic  acid x 1.  Continues showing mild improvement, calcium  now down to 11.4.  Will recheck calcium  in AM.   Combined pulmonary fibrosis and emphysema - Currently not hypoxic but likely contributing to underlying dyspnea.  Nebulizers on board.   Persistent atrial fibrillation - Resume home medication regimen.  Transition to heparin  drip.   Concern for severe sepsis - Leukocytosis, lactate greater than 3, tachycardia on presentation given concern for severe sepsis.  Blood cultures, IV fluid bolus, empiric antibiotics on board.  Lactate resolved.  Still has remarkable leukocytosis.  Continue to monitor blood pressure closely.   Bipolar 1 disorder - Continue home regimen   Subjective: Patient resting comfortably. States he feels little bit better from yesterday. Has intermittent coughing but appears less productive this morning. Denies any fever, chills, chest pain, nausea, vomiting, abdominal pain.   Physical Exam:  Vitals:   02/08/24 2316 02/09/24 0500 02/09/24 0700 02/09/24 1100  BP: 103/76 104/74 109/73 114/80  Pulse: 97  85 97  Resp: 20 20 18 14   Temp: 97.7 F (36.5 C) 97.7 F (36.5 C) (!) 97.4 F (36.3 C) (!) 97.2 F (36.2 C)  TempSrc: Oral Oral Axillary Axillary  SpO2: 94% 98% 95% 95%  Weight:      Height:        GENERAL:  Alert, pleasant, no acute distress, disheveled HEENT:  EOMI CARDIOVASCULAR: Irregularly irregular RESPIRATORY: Poor air movement bilaterally, no wheezing GASTROINTESTINAL:  Soft, nontender, nondistended EXTREMITIES:  No LE edema bilaterally NEURO:  No new focal deficits appreciated SKIN:  No rashes noted PSYCH:  Appropriate mood and affect    Data Reviewed:  Imaging Studies: CT ABDOMEN PELVIS W CONTRAST Result Date: 02/09/2024 CLINICAL DATA:  Persistent left upper lobe consolidative mass and left lower lobe nodule, suspicious for malignancy. Staging. * Tracking Code: BO * EXAM: CT ABDOMEN AND  PELVIS WITH CONTRAST TECHNIQUE: Multidetector CT imaging of the abdomen and pelvis was performed using the standard protocol following bolus administration of intravenous contrast. RADIATION DOSE REDUCTION: This exam was performed according to the departmental dose-optimization program which includes automated exposure control, adjustment of the mA and/or kV according to patient size and/or use of iterative reconstruction technique. CONTRAST:  75mL OMNIPAQUE  IOHEXOL  350 MG/ML SOLN COMPARISON:  CTA abdomen and pelvis dated 05/29/2023, CT chest dated 01/29/2024 FINDINGS: Lower chest: Partially imaged posterior left upper lobe mass-like consolidation extending to the hilum and encasing the left upper lobe airways. Interval increased left lower lobe volume loss with suggestion of heterogeneous consolidation. Partially imaged fibrotic changes of the right lung. Increased left pleural effusion, which appears at least partially loculated. Partially imaged heterogeneously enhancing, nodular pleural thickening, for example 5.5 x 1.7 cm at the left lung base (3:28). Partially imaged multichamber cardiomegaly. Partially imaged mediastinal lymphadenopathy, which appears slightly increased in size, for example 12 mm (3: 1), previously 9 mm. Coronary artery calcifications. Hepatobiliary: Ill-defined hypoattenuating focus adjacent to the falciform ligament in segment 4 measuring 2.0 x 1.4 cm (3:45) is in a location typical for focal fatty infiltration, however is new from 05/29/2023. Unchanged subcentimeter segment 2 hypodensities, too small to characterize, but likely cysts. No intra or extrahepatic biliary ductal dilation. Normal gallbladder. Pancreas: No focal lesions or main ductal dilation. Spleen: Normal in size without focal abnormality. Adrenals/Urinary Tract: No adrenal nodules. No suspicious renal mass or hydronephrosis. 2 mm hyperattenuating focus within the proximal left ureter (3:57) without obstruction. No focal  bladder wall thickening. Stomach/Bowel: Normal appearance of the stomach. No evidence of bowel wall thickening, distention, or inflammatory changes. Colonic diverticulosis without acute diverticulitis. Normal appendix. Vascular/Lymphatic: Aortic atherosclerosis. Infrarenal abdominal aortic aneurysm measures 3.2 x 3.1 cm, unchanged. Similar occlusion of the proximal left femoral artery. Prominent left retrocrural lymph node measures 7 mm (3:34). Reproductive: Enlargement of the prostate with median lobe hypertrophy. Other: No free fluid, fluid collection, or free air. New, heterogeneously enhancing nodule lateral to the left psoas muscle measures 1.3 x 1.2 cm (3:63). Musculoskeletal: No acute or abnormal lytic or blastic osseous lesions. IMPRESSION: 1. Partially imaged posterior left upper lobe mass-like consolidation extending to the hilum and encasing the left upper lobe airways, highly suspicious for malignancy. Interval increased left lower lobe volume loss with suggestion of heterogeneous consolidation, which may reflect atelectasis or postobstructive pneumonia. 2. Increased left pleural effusion, which appears at least partially loculated. Partially imaged heterogeneously enhancing, nodular pleural thickening, suspicious for pleural metastases. 3. Partially imaged mediastinal lymphadenopathy, which appears slightly increased in size, suspicious for metastasis. 4. New, heterogeneously enhancing nodule lateral to the left psoas muscle measures 1.3 x 1.2 cm, suspicious for metastatic disease. 5. Ill-defined hypoattenuating focus adjacent to the falciform ligament in hepatic segment 4 is in a location typical for focal fatty infiltration, however is new from 05/29/2023, indeterminate. 6. A 2 mm hyperattenuating focus within the proximal left ureter without obstruction, which may represent a nonobstructing calculus. 7. Unchanged infrarenal abdominal aortic aneurysm measures 3.2 cm. 8. Aortic Atherosclerosis  (ICD10-I70.0). Coronary artery calcifications. Assessment for potential risk factor modification, dietary therapy or pharmacologic therapy may be warranted, if clinically indicated. Electronically Signed   By: Limin  Xu M.D.   On:  02/09/2024 08:55   DG Chest 2 View Result Date: 02/03/2024 CLINICAL DATA:  Shortness of breath.  Tachycardia. EXAM: CHEST - 2 VIEW COMPARISON:  12/29/2023 and CT chest 01/29/2024. FINDINGS: The trachea is midline. Heart size stable. Cavitary masslike consolidation in the left upper lobe has progressed from 12/29/2023. Associated pleural thickening and pleural fluid in the lateral and lower left hemithorax. Mild coarsening of the peripheral pulmonary markings. IMPRESSION: 1. Worsening cavitary masslike consolidation in the left upper lobe, worrisome for primary bronchogenic carcinoma. Pneumonia is not excluded. 2. Partially loculated small left pleural effusion and/or thickening. 3. Interstitial lung disease, better seen on 01/29/2024. Electronically Signed   By: Newell Eke M.D.   On: 02/03/2024 16:32   CT CHEST WO CONTRAST Result Date: 02/03/2024 CLINICAL DATA:  Pneumonia, complications suspected, x-ray done. * Tracking Code: BO * EXAM: CT CHEST WITHOUT CONTRAST TECHNIQUE: Multidetector CT imaging of the chest was performed following the standard protocol without IV contrast. RADIATION DOSE REDUCTION: This exam was performed according to the departmental dose-optimization program which includes automated exposure control, adjustment of the mA and/or kV according to patient size and/or use of iterative reconstruction technique. COMPARISON:  CT December 29, 2023 and June 04, 2021 FINDINGS: Cardiovascular: Aortic atherosclerosis. Three-vessel coronary artery calcifications/stents. Calcifications of the aortic valve and annulus. Calcifications of the mitral annulus. Normal size heart. No significant pericardial effusion/thickening. Mediastinum/Nodes: No suspicious thyroid  nodule.  Prominent mediastinal lymph nodes are similar prior for instance an AP window lymph node measuring 9 mm in short axis on image 56/301 is unchanged. The esophagus is grossly unremarkable. Lungs/Pleura: Persistent dense consolidation with air bronchograms in the left upper lobe measuring 7.5 x 5.6 cm on image 46/302 previously 7.0 x 5.6 cm. Increased size of the adjacent pleural effusion/pleural thickening. Additional consolidative nodularity in the left lower lobe has increased from prior for instance measuring 17 x 10 mm on image 73/302 previously 14 x 7 mm. Similar findings of basilar predominant interstitial lung disease. Upper Abdomen: No acute abnormality. Musculoskeletal: No aggressive lytic or blastic lesion of bone. Multilevel degenerative changes spine. Diffuse demineralization of bone. IMPRESSION: 1. Slight increase in size of the dense mass like consolidation with air bronchograms in the left upper lobe with increased adjacent pleural effusion/pleural thickening and increased size of additional consolidative nodularity in the left lower lobe. Findings are nonspecific and may reflect an infectious/inflammatory process with possible treatment failure, however underlying malignancy is a pertinent differential consideration warranting further evaluation. Suggest pulmonary consultation with consideration for direct tissue sampling and/or nuclear medicine PET-CT. 2. Prominent mediastinal lymph nodes are nonspecific but similar prior suggest continued attention on follow-up imaging. 3. Similar findings of basilar predominant interstitial lung disease. 4. Aortic atherosclerosis. Aortic Atherosclerosis (ICD10-I70.0). Electronically Signed   By: Reyes Holder M.D.   On: 02/03/2024 15:06   VAS US  LOWER EXTREMITY ARTERIAL DUPLEX Result Date: 01/19/2024 LOWER EXTREMITY ARTERIAL DUPLEX STUDY Patient Name:  STEFANO TRULSON  Date of Exam:   01/19/2024 Medical Rec #: [id number]       Accession #:    [id number] Date of  Birth: 17-Apr-1951       Patient Gender: M Patient Age:   33 years Exam Location:  [address] Procedure:      VAS US  LOWER EXTREMITY ARTERIAL DUPLEX Referring Phys: [facility] --------------------------------------------------------------------------------  Indications: Peripheral artery disease. High Risk Factors: Hypertension, hyperlipidemia, current smoker.  Vascular Interventions: Successful diamondback orbital rotation  arthrectomy, PT                         and covered stent at a subtotal calcified proximal                         right                         common iliac artery on 04/27/2017. Angioplasty and                         stent                         of left common and external iliac artery, and left                         common femoral endarterectomy including                         profundoplasty                         and endarterectomy the proximal SFA with bovine                         pericardial patch angioplasty on 09/02/2023. Current ABI:            0.5/0.54 Comparison Study: 04/09/17 Performing Technologist: Garnette Rockers  Examination Guidelines: A complete evaluation includes B-mode imaging, spectral Doppler, color Doppler, and power Doppler as needed of all accessible portions of each vessel. Bilateral testing is considered an integral part of a complete examination. Limited examinations for reoccurring indications may be performed as noted.  +-----------+--------+-----+---------------+----------+-----------------------+ RIGHT      PSV cm/sRatioStenosis       Waveform  Comments                +-----------+--------+-----+---------------+----------+-----------------------+ CFA Distal 27                          biphasic                          +-----------+--------+-----+---------------+----------+-----------------------+ DFA        64           50-74% stenosismonophasic                         +-----------+--------+-----+---------------+----------+-----------------------+ SFA Prox                occluded                                         +-----------+--------+-----+---------------+----------+-----------------------+ SFA Mid                 occluded                                         +-----------+--------+-----+---------------+----------+-----------------------+ SFA Distal              occluded                                         +-----------+--------+-----+---------------+----------+-----------------------+  POP Prox   19                          monophasicReconstitution from DFA +-----------+--------+-----+---------------+----------+-----------------------+ POP Distal 27                          monophasic                        +-----------+--------+-----+---------------+----------+-----------------------+ ATA Distal 19                          monophasic                        +-----------+--------+-----+---------------+----------+-----------------------+ PTA Distal 11                          monophasic                        +-----------+--------+-----+---------------+----------+-----------------------+ PERO Distal19                          monophasic                        +-----------+--------+-----+---------------+----------+-----------------------+ DFA Mid - 90 cm/s - monophasic DFA Dst - 51 cm/s - monophasic.  Summary: Right: 50-74% stenosis noted in the deep femoral artery. Total occlusion noted in the superficial femoral artery.  See table(s) above for measurements and observations. Electronically signed by Lonni Gaskins MD on 01/19/2024 at 12:31:40 PM.    Final    VAS US  ABI WITH/WO TBI Result Date: 01/19/2024  LOWER EXTREMITY DOPPLER STUDY Patient Name:  EIN RIJO  Date of Exam:   01/19/2024 Medical Rec #: [id number]       Accession #:    [id number] Date of Birth: 10/18/1950       Patient Gender: M Patient Age:    89 years Exam Location:  [address] Procedure:      VAS US  ABI WITH/WO TBI Referring Phys: [facility] --------------------------------------------------------------------------------  Indications: Peripheral artery disease. High Risk Factors: Hypertension, hyperlipidemia, current smoker.  Vascular Interventions: Successful diamondback orbital rotation                         arthrectomy, PT                         and covered stent at a subtotal calcified proximal                         right                         common iliac artery on 04/27/2017. Angioplasty and                         stent                         of left common and external iliac artery, and left  common femoral endarterectomy including                         profundoplasty                         and endarterectomy the proximal SFA with bovine                         pericardial patch angioplasty on 09/02/2023. Comparison Study: 10/13/23 Performing Technologist: Garnette Rockers  Examination Guidelines: A complete evaluation includes at minimum, Doppler waveform signals and systolic blood pressure reading at the level of bilateral brachial, anterior tibial, and posterior tibial arteries, when vessel segments are accessible. Bilateral testing is considered an integral part of a complete examination. Photoelectric Plethysmograph (PPG) waveforms and toe systolic pressure readings are included as required and additional duplex testing as needed. Limited examinations for reoccurring indications may be performed as noted.  ABI Findings: +---------+------------------+-----+-------------------+--------+ Right    Rt Pressure (mmHg)IndexWaveform           Comment  +---------+------------------+-----+-------------------+--------+ Brachial 139                                                +---------+------------------+-----+-------------------+--------+ PTA      53                0.38 dampened monophasic          +---------+------------------+-----+-------------------+--------+ DP       70                0.50 dampened monophasic         +---------+------------------+-----+-------------------+--------+ Great Toe0                 0.00 Absent                      +---------+------------------+-----+-------------------+--------+ +---------+------------------+-----+----------+-------+ Left     Lt Pressure (mmHg)IndexWaveform  Comment +---------+------------------+-----+----------+-------+ Brachial 139                                      +---------+------------------+-----+----------+-------+ PTA      75                0.54 monophasic        +---------+------------------+-----+----------+-------+ DP       66                0.47 monophasic        +---------+------------------+-----+----------+-------+ Great Toe34                0.24 Abnormal          +---------+------------------+-----+----------+-------+ +-------+-----------+-----------+------------+------------+ ABI/TBIToday's ABIToday's TBIPrevious ABIPrevious TBI +-------+-----------+-----------+------------+------------+ Right  0.5        0.0        0.28        0.0          +-------+-----------+-----------+------------+------------+ Left   0.54       0.24       0.64        0.3          +-------+-----------+-----------+------------+------------+  Summary: Right: Resting right ankle-brachial index indicates moderate right lower extremity arterial disease. The right toe-brachial index  is abnormal. Left: Resting left ankle-brachial index indicates moderate left lower extremity arterial disease. The left toe-brachial index is abnormal. *See table(s) above for measurements and observations.  Electronically signed by Lonni Gaskins MD on 01/19/2024 at 11:19:29 AM.    Final     Results as above  Previous records (including but not limited to H&P, progress notes, nursing notes, TOC management) were reviewed in  assessment of this patient.  Labs: CBC: Recent Labs  Lab 02/03/24 1548 02/03/24 2044 02/05/24 0841 02/06/24 0234 02/07/24 0300 02/08/24 0315 02/09/24 0240  WBC 21.7*   < > 15.4* 17.9* 21.8* 21.6* 18.9*  NEUTROABS 19.3*  --   --   --   --   --   --   HGB 11.3*   < > 9.2* 9.1* 9.4* 9.8* 10.3*  HCT 35.7*   < > 28.7* 29.0* 29.3* 31.5* 33.1*  MCV 81.3   < > 81.5 81.5 80.3 81.6 81.7  PLT 354   < > 258 272 319 329 311   < > = values in this interval not displayed.   Basic Metabolic Panel: Recent Labs  Lab 02/03/24 1723 02/03/24 1812 02/05/24 0841 02/06/24 0234 02/07/24 0300 02/08/24 0315 02/09/24 0240  NA  --    < > 138 137 139 140 137  K  --    < > 3.5 4.1 3.2* 3.7 3.4*  CL  --    < > 103 107 103 101 94*  CO2  --    < > 24 23 25 29  33*  GLUCOSE  --    < > 90 109* 126* 136* 99  BUN  --    < > 16 13 12 18 18   CREATININE  --    < > 0.82 0.89 0.94 1.12 1.07  CALCIUM   --    < > 12.8* 12.8* 13.3* 12.9* 11.4*  MG  --   --  1.6* 2.1 1.6* 2.0 1.6*  PHOS 2.5  --   --   --   --   --   --    < > = values in this interval not displayed.   Liver Function Tests: Recent Labs  Lab 02/03/24 1548 02/03/24 2339  AST 22 19  ALT 12 12  ALKPHOS 116 70  BILITOT 0.6 0.5  PROT 7.4 5.4*  ALBUMIN  3.7 2.2*   CBG: No results for input(s): GLUCAP in the last 168 hours.  Scheduled Meds:  bisacodyl   5 mg Oral Once   budesonide -glycopyrrolate -formoterol   2 puff Inhalation BID   cholecalciferol   10,000 Units Oral QPM   dofetilide   500 mcg Oral BID   doxycycline   100 mg Oral Q12H   furosemide   60 mg Intravenous Q8H   gabapentin   300 mg Oral TID   polyethylene glycol  17 g Oral Daily   potassium chloride   40 mEq Oral BID   pyridOXINE   100 mg Oral QPM   sorbitol   30 mL Oral Once   Continuous Infusions:  sodium chloride  Stopped (02/09/24 0530)   heparin  1,600 Units/hr (02/09/24 1230)   piperacillin -tazobactam 12.5 mL/hr at 02/09/24 0855   PRN Meds:.albuterol , bisacodyl , bisacodyl ,  guaiFENesin -dextromethorphan , HYDROcodone -acetaminophen , ketoconazole , triamcinolone  cream  Family Communication: None at bedside  Disposition: Status is: Inpatient Remains inpatient appropriate because: Left upper lobe mass, hypercalcemia     Time spent: 51 minutes  Length of inpatient stay: 6 days  Author: Carliss LELON Canales, DO 02/09/2024 12:51 PM  For on call review [email or web address].

## 2024-02-10 ENCOUNTER — Inpatient Hospital Stay (HOSPITAL_COMMUNITY)

## 2024-02-10 ENCOUNTER — Encounter (HOSPITAL_COMMUNITY): Admission: EM | Disposition: E | Payer: Self-pay | Source: Home / Self Care | Attending: Internal Medicine

## 2024-02-10 DIAGNOSIS — A419 Sepsis, unspecified organism: Secondary | ICD-10-CM | POA: Diagnosis not present

## 2024-02-10 DIAGNOSIS — J90 Pleural effusion, not elsewhere classified: Secondary | ICD-10-CM | POA: Diagnosis not present

## 2024-02-10 DIAGNOSIS — R918 Other nonspecific abnormal finding of lung field: Secondary | ICD-10-CM | POA: Diagnosis not present

## 2024-02-10 DIAGNOSIS — R6520 Severe sepsis without septic shock: Secondary | ICD-10-CM | POA: Diagnosis not present

## 2024-02-10 HISTORY — PX: THORACENTESIS: SHX235

## 2024-02-10 LAB — CBC
HCT: 29.9 % — ABNORMAL LOW (ref 39.0–52.0)
Hemoglobin: 9.4 g/dL — ABNORMAL LOW (ref 13.0–17.0)
MCH: 25.4 pg — ABNORMAL LOW (ref 26.0–34.0)
MCHC: 31.4 g/dL (ref 30.0–36.0)
MCV: 80.8 fL (ref 80.0–100.0)
Platelets: 329 K/uL (ref 150–400)
RBC: 3.7 MIL/uL — ABNORMAL LOW (ref 4.22–5.81)
RDW: 18.6 % — ABNORMAL HIGH (ref 11.5–15.5)
WBC: 19.9 K/uL — ABNORMAL HIGH (ref 4.0–10.5)
nRBC: 0 % (ref 0.0–0.2)

## 2024-02-10 LAB — HEPARIN LEVEL (UNFRACTIONATED): Heparin Unfractionated: 0.47 [IU]/mL (ref 0.30–0.70)

## 2024-02-10 LAB — BASIC METABOLIC PANEL WITH GFR
Anion gap: 11 (ref 5–15)
BUN: 21 mg/dL (ref 8–23)
CO2: 33 mmol/L — ABNORMAL HIGH (ref 22–32)
Calcium: 10.5 mg/dL — ABNORMAL HIGH (ref 8.9–10.3)
Chloride: 92 mmol/L — ABNORMAL LOW (ref 98–111)
Creatinine, Ser: 1.3 mg/dL — ABNORMAL HIGH (ref 0.61–1.24)
GFR, Estimated: 58 mL/min — ABNORMAL LOW (ref >60)
Glucose, Bld: 87 mg/dL (ref 70–99)
Potassium: 3.5 mmol/L (ref 3.5–5.1)
Sodium: 136 mmol/L (ref 135–145)

## 2024-02-10 LAB — LACTATE DEHYDROGENASE, PLEURAL OR PERITONEAL FLUID: LD, Fluid: 300 U/L — ABNORMAL HIGH (ref 3–23)

## 2024-02-10 LAB — BODY FLUID CELL COUNT WITH DIFFERENTIAL
Eos, Fluid: 1 %
Lymphs, Fluid: 80 %
Monocyte-Macrophage-Serous Fluid: 11 % — ABNORMAL LOW (ref 50–90)
Neutrophil Count, Fluid: 8 % (ref 0–25)
Total Nucleated Cell Count, Fluid: 723 uL (ref 0–1000)

## 2024-02-10 LAB — MAGNESIUM: Magnesium: 2 mg/dL (ref 1.7–2.4)

## 2024-02-10 LAB — GLUCOSE, CAPILLARY: Glucose-Capillary: 107 mg/dL — ABNORMAL HIGH (ref 70–99)

## 2024-02-10 LAB — GLUCOSE, PLEURAL OR PERITONEAL FLUID: Glucose, Fluid: 100 mg/dL

## 2024-02-10 LAB — PROTEIN, PLEURAL OR PERITONEAL FLUID: Total protein, fluid: 3.5 g/dL

## 2024-02-10 LAB — ALBUMIN, PLEURAL OR PERITONEAL FLUID: Albumin, Fluid: 1.7 g/dL

## 2024-02-10 SURGERY — THORACENTESIS
Anesthesia: LOCAL | Laterality: Left

## 2024-02-10 MED ORDER — POTASSIUM CHLORIDE CRYS ER 20 MEQ PO TBCR
40.0000 meq | EXTENDED_RELEASE_TABLET | Freq: Two times a day (BID) | ORAL | Status: DC
  Administered 2024-02-11 – 2024-02-14 (×8): 40 meq via ORAL
  Filled 2024-02-10 (×9): qty 2

## 2024-02-10 MED ORDER — HEPARIN (PORCINE) 25000 UT/250ML-% IV SOLN
1850.0000 [IU]/h | INTRAVENOUS | Status: DC
  Administered 2024-02-10 – 2024-02-13 (×7): 1600 [IU]/h via INTRAVENOUS
  Administered 2024-02-14 – 2024-02-15 (×2): 1800 [IU]/h via INTRAVENOUS
  Administered 2024-02-15: 1850 [IU]/h via INTRAVENOUS
  Filled 2024-02-10 (×7): qty 250

## 2024-02-10 MED ORDER — POTASSIUM CHLORIDE 20 MEQ PO PACK
60.0000 meq | PACK | Freq: Two times a day (BID) | ORAL | Status: AC
  Administered 2024-02-10 (×4): 60 meq via ORAL
  Filled 2024-02-10 (×2): qty 3

## 2024-02-10 NOTE — TOC Progression Note (Signed)
 Transition of Care [facility]) - Progression Note    Patient Details  Name: Bradley Ferguson MRN: [id number] Date of Birth: [birth year]  [facility] CM/SW Contact  [name], [place] Phone Number: 02/10/2024, 10:47 AM  Clinical Narrative:   Per daily meeting with treatment team, patient going for a thoracentesis today. Patients bronchoscopy is tentatively scheduled for Mon 8/18. Awaiting pt to be closer to medical readiness before further pursuing SNF placement.   CSW will continue to follow.   Expected Discharge Plan: Skilled Nursing Facility Barriers to Discharge: Continued Medical Work up, SNF Pending bed offer, Insurance Authorization               Expected Discharge Plan and Services In-house Referral: Clinical Social Work     Living arrangements for the past 2 months: Single Family Home                                       Social Drivers of Health (SDOH) Interventions SDOH Screenings   Food Insecurity: Food Insecurity Present (02/03/2024)  Housing: Low Risk  (02/03/2024)  Transportation Needs: No Transportation Needs (02/03/2024)  Utilities: Not At Risk (02/03/2024)  Alcohol Screen: Low Risk  (12/29/2023)  Depression (PHQ2-9): Medium Risk (11/13/2023)  Financial Resource Strain: Medium Risk (12/29/2023)  Physical Activity: Inactive (12/29/2023)  Social Connections: Socially Isolated (02/03/2024)  Stress: Stress Concern Present (12/29/2023)  Tobacco Use: High Risk (02/03/2024)  Health Literacy: Adequate Health Literacy (11/13/2023)    Readmission Risk Interventions    02/04/2024    3:23 PM 11/10/2023    2:54 PM  Readmission Risk Prevention Plan  Post Dischage Appt  Complete  Medication Screening  Complete  Transportation Screening Complete Complete  PCP or Specialist Appt within 5-7 Days Complete   Home Care Screening Complete   Medication Review (RN CM) Complete

## 2024-02-10 NOTE — Progress Notes (Signed)
 PROGRESS NOTE  CICERO NOY FMW:[id number] DOB: 08-11-1950 DOA: 02/03/2024 PCP: Sebastian Beverley NOVAK, MD   LOS: 7 days   Brief Narrative / Interim history: 73 y.o. male with medical history significant of patient with history of COPD, interstitial lung disease, patient has latent tuberculosis, atrial fibrillation on Tikosyn  and Xarelto , peripheral arterial disease, essential hypertension, anxiety disorder depression, history of hematuria with lung consolidation was on antibiotics by pulmonary.  Patient went to see pulmonary today due to near syncope and productive cough.  He was found to have significant dyspnea and tachycardia with heart rate in the 120s.  Patient noted to have oxygen saturation 90% on room air.  He was however noted to have hypercalcemia, leukocytosis with left shift and lactic acidosis.   Subjective / 24h Interval events: He tells me he is feeling overall a little bit better, however still remains pretty dyspneic.  He tells me he cannot even walk to the door at this point.  Reports increasing shortness of breath for the past 2 years but more so in the last 3 months  Assesement and Plan: Principal Problem:   Severe sepsis (HCC) Active Problems:   ANXIETY DEPRESSION   Bipolar I disorder (HCC)   Atrial fibrillation (HCC)   Peripheral arterial disease (HCC)   Chronic obstructive pulmonary disease (HCC)   Interstitial pulmonary disease (HCC)   TB lung, latent   Essential hypertension   Hypercalcemia  Principal problem Acute hypoxic respiratory failure in the setting of left upper lobe consolidation -CT scan on 8/1 showed slightly increase of the left upper lobe dense consolidation, concerning for infection versus malignancy.  He has been having worsening shortness of breath, cough, hemoptysis on presentation.  Pulmonary consulted and following.  Legionella, histoplasma antigen, Fungitell beta D glucan, Blastomyces antigen all pending - Given history of latent TB, he underwent  AFB, now negative x 3 - Has been placed on Zosyn , continue.  Active problems Concern for primary lung cancer -given left upper lobe mass, significant hypercalcemia there is a good likelihood of malignancy.  CT abdomen pelvis with contrast did show a left lateral psoas nodule concerning for metastasis - Pulmonary plans for thoracentesis today, if no malignancy identified would likely need IR biopsy  Hypercalcemia of malignancy-over 15 on presentation.  PTH appropriately low, PTH RP pending.  Received fluids, Lasix , calcium  now down to 10.5.  Hold further Lasix  due to creatinine rise  Acute kidney injury-mild, creatinine up to 1.3, due to Lasix .  Hold for now  Pulmonary fibrosis, emphysema-contributing to his dyspnea  Persistent A-fib-continue dofetilide , on heparin   Concern for severe sepsis-sepsis physiology improved.  Likely due to his possible upper lobe consolidation  Bipolar 1 disorder-continue home regimen   Scheduled Meds:  bisacodyl   5 mg Oral Once   budesonide -glycopyrrolate -formoterol   2 puff Inhalation BID   cholecalciferol   10,000 Units Oral QPM   dofetilide   500 mcg Oral BID   furosemide   60 mg Intravenous Q8H   gabapentin   300 mg Oral TID   polyethylene glycol  17 g Oral Daily   potassium chloride   60 mEq Oral BID   [START ON 02/11/2024] potassium chloride   40 mEq Oral BID   pyridOXINE   100 mg Oral QPM   sorbitol   30 mL Oral Once   Continuous Infusions:  sodium chloride  125 mL/hr at 02/09/24 2306   piperacillin -tazobactam 3.375 g (02/10/24 0647)   PRN Meds:.albuterol , bisacodyl , bisacodyl , guaiFENesin -dextromethorphan , HYDROcodone -acetaminophen , ketoconazole , triamcinolone  cream  Current Outpatient Medications  Medication Instructions   albuterol  (  PROVENTIL ) 2.5 mg, Nebulization, Every 6 hours PRN   albuterol  (VENTOLIN  HFA) 108 (90 Base) MCG/ACT inhaler Use  2 inhalations  15 minutes apart  every 4 hours  to rescue Asthma Attack   atorvastatin  (LIPITOR ) 80 MG  tablet Take 1 tablet Daily for Cholesterol   budesonide -glycopyrrolate -formoterol  (BREZTRI  AEROSPHERE) 160-9-4.8 MCG/ACT AERO inhaler 2 puffs, Inhalation, 2 times daily   Cholecalciferol  10,000 Units, Every evening   dofetilide  (TIKOSYN ) 500 mcg, Oral, 2 times daily   gabapentin  (NEURONTIN ) 300 MG capsule Take 1 capsule 3 x /day for Neuropathy Pain   ketoconazole  (NIZORAL ) 2 % cream 1 Application, Daily PRN   oxyCODONE  (OXY IR/ROXICODONE ) 5 mg, Every 4 hours PRN   pyridOXINE  (VITAMIN B6) 100 mg, Every evening   rivaroxaban  (XARELTO ) 20 mg, Oral, Daily with supper   triamcinolone  cream (KENALOG ) 0.5 % 1 Application, Daily PRN    Diet Orders (From admission, onward)     Start     Ordered   02/03/24 1943  Diet Heart Room service appropriate? Yes; Fluid consistency: Thin  Diet effective now       Question Answer Comment  Room service appropriate? Yes   Fluid consistency: Thin      02/03/24 1944            DVT prophylaxis:    Lab Results  Component Value Date   PLT 329 02/10/2024      Code Status: Full Code  Family Communication: no family at bedside   Status is: Inpatient Remains inpatient appropriate because: Severity of illness   Level of care: Progressive  Consultants:  PCCM  Objective: Vitals:   02/09/24 2329 02/10/24 0343 02/10/24 0724 02/10/24 0803  BP: (!) 117/92 (!) 93/58  107/71  Pulse: 94 75  83  Resp: 17 18  20   Temp: 97.9 F (36.6 C) 97.7 F (36.5 C)  98.1 F (36.7 C)  TempSrc: Oral Oral  Oral  SpO2: 99% 99% 99% 99%  Weight:      Height:        Intake/Output Summary (Last 24 hours) at 02/10/2024 1152 Last data filed at 02/10/2024 1000 Gross per 24 hour  Intake 340 ml  Output 1700 ml  Net -1360 ml   Wt Readings from Last 3 Encounters:  02/08/24 67.9 kg  01/21/24 68.9 kg  01/19/24 68 kg    Examination:  Constitutional: NAD Eyes: no scleral icterus ENMT: Mucous membranes are moist.  Neck: normal, supple Respiratory: No wheezing,  faint rhonchi Cardiovascular: Regular rate and rhythm, no murmurs / rubs / gallops. No LE edema.  Abdomen: non distended, no tenderness. Bowel sounds positive.  Musculoskeletal: no clubbing / cyanosis.    Data Reviewed: I have independently reviewed following labs and imaging studies   CBC Recent Labs  Lab 02/03/24 1548 02/03/24 2044 02/06/24 0234 02/07/24 0300 02/08/24 0315 02/09/24 0240 02/10/24 0225  WBC 21.7*   < > 17.9* 21.8* 21.6* 18.9* 19.9*  HGB 11.3*   < > 9.1* 9.4* 9.8* 10.3* 9.4*  HCT 35.7*   < > 29.0* 29.3* 31.5* 33.1* 29.9*  PLT 354   < > 272 319 329 311 329  MCV 81.3   < > 81.5 80.3 81.6 81.7 80.8  MCH 25.7*   < > 25.6* 25.8* 25.4* 25.4* 25.4*  MCHC 31.7   < > 31.4 32.1 31.1 31.1 31.4  RDW 17.7*   < > 18.1* 18.4* 18.8* 18.8* 18.6*  LYMPHSABS 1.0  --   --   --   --   --   --  MONOABS 1.1*  --   --   --   --   --   --   EOSABS 0.1  --   --   --   --   --   --   BASOSABS 0.1  --   --   --   --   --   --    < > = values in this interval not displayed.    Recent Labs  Lab 02/03/24 1548 02/03/24 1550 02/03/24 1723 02/03/24 1812 02/03/24 2044 02/03/24 2339 02/05/24 0841 02/06/24 0234 02/07/24 0300 02/08/24 0315 02/08/24 1650 02/09/24 0240 02/10/24 0225  NA 140  --   --  137  --  139   < > 137 139 140  --  137 136  K 3.7  --   --  3.7  --  3.2*   < > 4.1 3.2* 3.7  --  3.4* 3.5  CL 98  --   --  99  --  104   < > 107 103 101  --  94* 92*  CO2 29  --   --  30  --  27   < > 23 25 29   --  33* 33*  GLUCOSE 169*  --   --  127*  --  124*   < > 109* 126* 136*  --  99 87  BUN 35*  --   --  32*  --  28*   < > 13 12 18   --  18 21  CREATININE 1.15  --   --  0.91   < > 0.91   < > 0.89 0.94 1.12  --  1.07 1.30*  CALCIUM  >15.0*  --   --  13.3*  --  12.4*   < > 12.8* 13.3* 12.9*  --  11.4* 10.5*  AST 22  --   --   --   --  19  --   --   --   --   --   --   --   ALT 12  --   --   --   --  12  --   --   --   --   --   --   --   ALKPHOS 116  --   --   --   --  70  --   --    --   --   --   --   --   BILITOT 0.6  --   --   --   --  0.5  --   --   --   --   --   --   --   ALBUMIN  3.7  --   --   --   --  2.2*  --   --   --   --   --   --   --   MG  --   --   --   --   --   --    < > 2.1 1.6* 2.0  --  1.6* 2.0  CRP  --   --   --   --   --   --   --   --   --   --  14.6*  --   --   LATICACIDVEN  --  3.2*  --  1.7  --   --   --   --   --   --   --   --   --  TSH  --   --  2.620  --   --   --   --   --   --   --   --   --   --    < > = values in this interval not displayed.    ------------------------------------------------------------------------------------------------------------------ No results for input(s): CHOL, HDL, LDLCALC, TRIG, CHOLHDL, LDLDIRECT in the last 72 hours.  Lab Results  Component Value Date   HGBA1C 5.9 (H) 07/06/2023   ------------------------------------------------------------------------------------------------------------------ No results for input(s): TSH, T4TOTAL, T3FREE, THYROIDAB in the last 72 hours.  Invalid input(s): FREET3  Cardiac Enzymes No results for input(s): CKMB, TROPONINI, MYOGLOBIN in the last 168 hours.  Invalid input(s): CK ------------------------------------------------------------------------------------------------------------------ No results found for: BNP  CBG: No results for input(s): GLUCAP in the last 168 hours.  Recent Results (from the past 240 hours)  Culture, blood (Routine X 2) w Reflex to ID Panel     Status: None   Collection Time: 02/03/24  3:49 PM   Specimen: BLOOD  Result Value Ref Range Status   Specimen Description   Final    BLOOD LEFT ANTECUBITAL Performed at [facility], [address] [place], [place] [address]    Special Requests   Final    BOTTLES DRAWN AEROBIC AND ANAEROBIC Blood Culture adequate volume Performed at [facility], [address], [place], [place] [address]    Culture   Final     NO GROWTH 5 DAYS Performed at [facility], [address], [place], [place] [address]    Report Status 02/08/2024 FINAL  Final  Culture, blood (Routine X 2) w Reflex to ID Panel     Status: None   Collection Time: 02/03/24  3:53 PM   Specimen: BLOOD  Result Value Ref Range Status   Specimen Description   Final    BLOOD RIGHT ANTECUBITAL Performed at [facility], [address] [place], [place] [address]    Special Requests   Final    BOTTLES DRAWN AEROBIC AND ANAEROBIC Blood Culture adequate volume Performed at [facility], [address], [place], [place] [address]    Culture   Final    NO GROWTH 5 DAYS Performed at [facility], [address], [place], [place] [address]    Report Status 02/08/2024 FINAL  Final  SARS Coronavirus 2 by RT PCR (hospital order, performed in [facility] hospital lab) *cepheid single result test* Anterior Nasal Swab     Status: None   Collection Time: 02/03/24  3:56 PM   Specimen: Anterior Nasal Swab  Result Value Ref Range Status   SARS Coronavirus 2 by RT PCR NEGATIVE NEGATIVE Final    Comment: (NOTE) SARS-CoV-2 target nucleic acids are NOT DETECTED.  The SARS-CoV-2 RNA is generally detectable in upper and lower respiratory specimens during the acute phase of infection. The lowest concentration of SARS-CoV-2 viral copies this assay can detect is 250 copies / mL. A negative result does not preclude SARS-CoV-2 infection and should not be used as the sole basis for treatment or other patient management decisions.  A negative result may occur with improper specimen collection / handling, submission of specimen other than nasopharyngeal swab, presence of viral mutation(s) within the areas targeted by this assay, and inadequate number of viral copies (<250 copies / mL). A negative result must be combined with clinical observations, patient history, and epidemiological information.  Fact Sheet  for Patients:   RoadLapTop.co.za  Fact Sheet for  Healthcare Providers: http://kim-miller.com/  This test is not yet approved or  cleared by the [place]  FDA and has been authorized for detection and/or diagnosis of SARS-CoV-2 by FDA under an Emergency Use Authorization (EUA).  This EUA will remain in effect (meaning this test can be used) for the duration of the COVID-19 declaration under Section 564(b)(1) of the Act, 21 U.S.C. section 360bbb-3(b)(1), unless the authorization is terminated or revoked sooner.  Performed at [facility], [address] [place], [place] [address]   Acid Fast Smear (AFB)     Status: None   Collection Time: 02/04/24  1:31 PM   Specimen: Sputum  Result Value Ref Range Status   AFB Specimen Processing Concentration  Final   Acid Fast Smear Negative  Final    Comment: (NOTE) Performed At: [facility] [address] [place], [place] [id number] Jennette Shorter MD Ey:[id number]    Source (AFB) EXPECTORATED SPUTUM  Final    Comment: Performed at [facility], [address] [place], [place] [address]  Acid Fast Smear (AFB)     Status: None   Collection Time: 02/04/24  1:32 PM   Specimen: Sputum  Result Value Ref Range Status   AFB Specimen Processing Concentration  Final   Acid Fast Smear Negative  Final    Comment: (NOTE) Performed At: [facility] [address] [place], [place] [id number] Jennette Shorter MD Ey:[id number]    Source (AFB) EXPECTORATED SPUTUM  Final    Comment: Performed at [facility], [address], [place], [place] [address]  MTB-RIF NAA with AFB Culture, sputum     Status: None (Preliminary result)   Collection Time: 02/04/24  1:39 PM  Result Value Ref Range Status   Myco tuberculosis Complex NOT DETECTED NOT DETECTED Final   Rifampin Not applicable NOT DETECTED Final   AFB Specimen Processing Concentration  Final    Comment:  (NOTE) Performed At: [facility] [address] [place], [place] [id number] Jennette Shorter MD Ey:[id number]    Acid Fast Culture PENDING  Incomplete   Source (MTB RIF) EXSPU  Final    Comment: Performed at [facility], [address], [place], [place] [address]  Expectorated Sputum Assessment w Gram Stain, Rflx to Resp Cult     Status: None   Collection Time: 02/04/24  1:40 PM   Specimen: INDUCED SPUTUM  Result Value Ref Range Status   Specimen Description INDUCED SPUTUM  Final   Special Requests NONE  Final   Sputum evaluation   Final    THIS SPECIMEN IS ACCEPTABLE FOR SPUTUM CULTURE Performed at [facility], [address] [place], [place] [address]    Report Status 02/04/2024 FINAL  Final  Culture, Respiratory w Gram Stain     Status: None   Collection Time: 02/04/24  1:40 PM   Specimen: INDUCED SPUTUM  Result Value Ref Range Status   Specimen Description INDUCED SPUTUM  Final   Special Requests NONE Reflexed from [id number]  Final   Gram Stain   Final    FEW WBC PRESENT, PREDOMINANTLY PMN RARE BUDDING YEAST SEEN    Culture   Final    RARE CANDIDA GLABRATA RARE Normal respiratory flora-no Staph aureus or Pseudomonas seen Performed at [facility], [address], [place], [place] [address]    Report Status 02/07/2024 FINAL  Final  Acid Fast Smear (AFB)     Status: None   Collection Time: 02/05/24  2:25  PM  Result Value Ref Range Status   AFB Specimen Processing Concentration  Final   Acid Fast Smear Negative  Final    Comment: (NOTE) Performed At: [facility] [address] [place], [place] [id number] Jennette Shorter MD Ey:[id number]    Source (AFB) INDUCED SPUTUM  Final    Comment: Performed at [facility], [address] [place], [place] [address]  Acid Fast Smear (AFB)     Status: None   Collection Time: 02/06/24  9:38 AM   Specimen: Sputum  Result Value Ref Range Status   AFB Specimen Processing Concentration  Final    Acid Fast Smear Negative  Final    Comment: (NOTE) Performed At: [facility] [address] [place], [place] [id number] Jennette Shorter MD Ey:[id number]    Source (AFB) SPU  Final    Comment: Performed at [facility], [address], [place], [place] [address]  MRSA Next Gen by PCR, Nasal     Status: None   Collection Time: 02/08/24  3:42 PM   Specimen: Nasal Mucosa; Nasal Swab  Result Value Ref Range Status   MRSA by PCR Next Gen NOT DETECTED NOT DETECTED Final    Comment: (NOTE) The GeneXpert MRSA Assay (FDA approved for NASAL specimens only), is one component of a comprehensive MRSA colonization surveillance program. It is not intended to diagnose MRSA infection nor to guide or monitor treatment for MRSA infections. Test performance is not FDA approved in patients less than 86 years old. Performed at [facility], [address], [place], [place] [address]      Radiology Studies: No results found.   Nilda Fendt, MD, PhD [facility]  Between 7 am - 7 pm I am available, please contact me via Amion (for emergencies) or Securechat (non urgent messages)  Between 7 pm - 7 am I am not available, please contact night coverage MD/APP via Amion

## 2024-02-10 NOTE — Progress Notes (Addendum)
 Thoracentesis  Procedure Note  Bradley Ferguson  [id number]  1950/12/22  Date:02/10/24  Time:2:17 PM   Provider Performing:Yeraldine Forney JAYSON Ferguson   Procedure: Thoracentesis with imaging guidance ([id number])  Indication(s) Pleural Effusion  Consent Risks of the procedure as well as the alternatives and risks of each were explained to the patient and/or caregiver.  Consent for the procedure was obtained and is signed in the bedside chart  Anesthesia Topical only with 1% lidocaine     Time Out Verified patient identification, verified procedure, site/side was marked, verified correct patient position, special equipment/implants available, medications/allergies/relevant history reviewed, required imaging and test results available.   Sterile Technique Maximal sterile technique including full sterile barrier drape, hand hygiene, sterile gown, sterile gloves, mask, hair covering, sterile ultrasound probe cover (if used).  Procedure Description Ultrasound was used to identify appropriate pleural anatomy for placement and overlying skin marked.  Area of drainage cleaned and draped in sterile fashion. Lidocaine  was used to anesthetize the skin and subcutaneous tissue.  Approximately 1600 cc's of clear serous/yellow appearing fluid was drained from the left pleural space. Catheter then removed and bandaid applied to site.   Complications/Tolerance None; patient tolerated the procedure well. Chest X-ray is ordered to confirm no post-procedural complication.   EBL Minimal   Specimen(s) Pleural fluid   Bradley Ferguson AGACNP-BC   [name] 02/10/2024, 2:18 PM  Please see [email or web address] for pager details.  From 7A-7P if no response, please call [phone]. After hours, please call ELink [phone].

## 2024-02-10 NOTE — Consult Note (Signed)
 ADMISSION DATE:  02/03/2024  SUBJECTIVE: 73 year old male with a past medical history of limited systemic sclerosis, COPD, ILD, A-fib, latent TB who is presenting to the hospital for further evaluation of syncopal episodes associated with tachycardia, dizziness, lightheadedness and unresolved pneumonia.  Dx w LTBI in 2021, not treated. Screened prior to initiation of Imuran.   Smoker. 1 pk/day. Almost 50 years.   P/w cough x 1 month w 20 kg weight loss.   Imaging  CT chest Aug 2025: reviewed by me. Has a LUL mass measuring 7.5x 5.6 cm. Additional nodule in the LLL. 4L station LN seen. Underlying basal and paraseptal bronchieclatasis and fibrotic changes. No significant GGO.  CT LDCT 2022: did not show LUL mass.  CT July 2025: mass in LUL 4.6x6 cm. LLL smaller than Aug.  CT abdomen showing left psoas muscle nodule. IR can do biopsy if needed. Also, worsening left sided effusion seen compared to prior CT chest.   Today: doing similar. Plan for thoracentesis today.   REVIEW OF SYSTEMS:   Pertinent ROS as per HPI  VITAL SIGNS: Temp:  [97.4 F (36.3 C)-98.1 F (36.7 C)] 97.4 F (36.3 C) (08/13 1255) Pulse Rate:  [59-103] 91 (08/13 1255) Resp:  [14-20] 19 (08/13 1255) BP: (93-146)/(58-119) 111/88 (08/13 1255) SpO2:  [90 %-99 %] 98 % (08/13 1255)  PHYSICAL EXAMINATION: General: cachetic.  Neuro:  axo3 Cardiovascular. RRR no murmur.  Lungs:  lung clear. Diminished BS on the right base.  Abdomen:  NT ND  Recent Labs  Lab 02/08/24 0315 02/09/24 0240 02/10/24 0225  NA 140 137 136  K 3.7 3.4* 3.5  CL 101 94* 92*  CO2 29 33* 33*  BUN 18 18 21   CREATININE 1.12 1.07 1.30*  GLUCOSE 136* 99 87   Recent Labs  Lab 02/08/24 0315 02/09/24 0240 02/10/24 0225  HGB 9.8* 10.3* 9.4*  HCT 31.5* 33.1* 29.9*  WBC 21.6* 18.9* 19.9*  PLT 329 311 329   CT ABDOMEN PELVIS W CONTRAST Result Date: 02/09/2024 CLINICAL DATA:  Persistent left upper lobe consolidative mass and left lower  lobe nodule, suspicious for malignancy. Staging. * Tracking Code: BO * EXAM: CT ABDOMEN AND PELVIS WITH CONTRAST TECHNIQUE: Multidetector CT imaging of the abdomen and pelvis was performed using the standard protocol following bolus administration of intravenous contrast. RADIATION DOSE REDUCTION: This exam was performed according to the departmental dose-optimization program which includes automated exposure control, adjustment of the mA and/or kV according to patient size and/or use of iterative reconstruction technique. CONTRAST:  75mL OMNIPAQUE  IOHEXOL  350 MG/ML SOLN COMPARISON:  CTA abdomen and pelvis dated 05/29/2023, CT chest dated 01/29/2024 FINDINGS: Lower chest: Partially imaged posterior left upper lobe mass-like consolidation extending to the hilum and encasing the left upper lobe airways. Interval increased left lower lobe volume loss with suggestion of heterogeneous consolidation. Partially imaged fibrotic changes of the right lung. Increased left pleural effusion, which appears at least partially loculated. Partially imaged heterogeneously enhancing, nodular pleural thickening, for example 5.5 x 1.7 cm at the left lung base (3:28). Partially imaged multichamber cardiomegaly. Partially imaged mediastinal lymphadenopathy, which appears slightly increased in size, for example 12 mm (3: 1), previously 9 mm. Coronary artery calcifications. Hepatobiliary: Ill-defined hypoattenuating focus adjacent to the falciform ligament in segment 4 measuring 2.0 x 1.4 cm (3:45) is in a location typical for focal fatty infiltration, however is new from 05/29/2023. Unchanged subcentimeter segment 2 hypodensities, too small to characterize, but likely cysts. No intra or extrahepatic biliary  ductal dilation. Normal gallbladder. Pancreas: No focal lesions or main ductal dilation. Spleen: Normal in size without focal abnormality. Adrenals/Urinary Tract: No adrenal nodules. No suspicious renal mass or hydronephrosis. 2 mm  hyperattenuating focus within the proximal left ureter (3:57) without obstruction. No focal bladder wall thickening. Stomach/Bowel: Normal appearance of the stomach. No evidence of bowel wall thickening, distention, or inflammatory changes. Colonic diverticulosis without acute diverticulitis. Normal appendix. Vascular/Lymphatic: Aortic atherosclerosis. Infrarenal abdominal aortic aneurysm measures 3.2 x 3.1 cm, unchanged. Similar occlusion of the proximal left femoral artery. Prominent left retrocrural lymph node measures 7 mm (3:34). Reproductive: Enlargement of the prostate with median lobe hypertrophy. Other: No free fluid, fluid collection, or free air. New, heterogeneously enhancing nodule lateral to the left psoas muscle measures 1.3 x 1.2 cm (3:63). Musculoskeletal: No acute or abnormal lytic or blastic osseous lesions. IMPRESSION: 1. Partially imaged posterior left upper lobe mass-like consolidation extending to the hilum and encasing the left upper lobe airways, highly suspicious for malignancy. Interval increased left lower lobe volume loss with suggestion of heterogeneous consolidation, which may reflect atelectasis or postobstructive pneumonia. 2. Increased left pleural effusion, which appears at least partially loculated. Partially imaged heterogeneously enhancing, nodular pleural thickening, suspicious for pleural metastases. 3. Partially imaged mediastinal lymphadenopathy, which appears slightly increased in size, suspicious for metastasis. 4. New, heterogeneously enhancing nodule lateral to the left psoas muscle measures 1.3 x 1.2 cm, suspicious for metastatic disease. 5. Ill-defined hypoattenuating focus adjacent to the falciform ligament in hepatic segment 4 is in a location typical for focal fatty infiltration, however is new from 05/29/2023, indeterminate. 6. A 2 mm hyperattenuating focus within the proximal left ureter without obstruction, which may represent a nonobstructing calculus. 7.  Unchanged infrarenal abdominal aortic aneurysm measures 3.2 cm. 8. Aortic Atherosclerosis (ICD10-I70.0). Coronary artery calcifications. Assessment for potential risk factor modification, dietary therapy or pharmacologic therapy may be warranted, if clinically indicated. Electronically Signed   By: Limin  Xu M.D.   On: 02/09/2024 08:55    STUDIES:  CXR/CT scan/ECHO  CT chest Aug 2025: reviewed by me. Has a LUL mass measuring 7.5x 5.6 cm. Additional nodule in the LLL. 4L station LN seen. Underlying basal and paraseptal bronchieclatasis and fibrotic changes. No significant GGO.  CT LDCT 2022: did not show LUL mass.  CT July 2025: mass in LUL 4.6x6 cm. LLL smaller than Aug.  CT abdomen Aug 2025: left Psoas muscle lesion (per radiologist concerning for met) and worsening left sided effusion.   ASSESSMENT / PLAN:  LUL mass w LLL nodule ILD likely 2/2 to limited SS Left Effusion.  Left psoas muscle lesion r/o malignant nodule.   LUL mass concerning for malignancy as outlined on my note from 8/11. Left effusion could be 2/2 to malignancy.   POCUS on 8/12 showing left sided simple mod-large effusion.   - Cont zosyn  and doxy. Abx for 7 days unless cultures/ID studies positive are positive.  - blasto, histo, fungitell, galactomanan- pending.  - MRSA, strep PNA- neg. Legionella pending.  - ESR and CRP- elevated.  - AFB and sputum cultures pending.  - plan for thoracentesis today. If positive for malignant cells and enough tissue on cell block may not need further investigation. If neg for malignant cells may need to target left psoas muscle lesion for biopsy.  - tentatively planned for robotic bronch on 8/19 w Dr Shelah.  - will follow the patient for ILD management as oupt. No changes to the regimen for now. Can continue w home breztri .  -  can resume heparin  gtt post procedure. Will get CXR post procedure.   Pulm will follow cytology for further planning.   Sammi JONETTA Fredericks, MD Pulmonary,  Critical Care & Sleep Medicine [facility] Pager: [phone]  02/10/2024, 1:32 PM

## 2024-02-10 NOTE — Progress Notes (Signed)
 Occupational Therapy Treatment Patient Details Name: Bradley Ferguson MRN: [id number] DOB: September 14, 1950 Today's Date: 02/10/2024   History of present illness 73 y.o. male admitted 02/03/24 for near syncope at pulmonologist appt. 8/1 CT with increase in size of dense masslike consolidation & pleural effusion. PMH: COPD, ILD, Afib, HTN, PAD s/p bil fem endarerectomy, HLD, raynaud's syndrome, latent tuberculosis, tobacco abuse   OT comments  Pt progressing towards goals. With encouragement pt agreeable to transfer to recliner. Pt required min assist to complete STS, once in standing min to CGA for short distance transfer. Pt continues to be limited by decreased activity tolerance and standing balance. Continue to recommend <3 hours of skilled rehab daily to optimize independence levels. Will continue to follow acutely.       If plan is discharge home, recommend the following:  A little help with walking and/or transfers;A little help with bathing/dressing/bathroom;Direct supervision/assist for medications management;Assist for transportation   Equipment Recommendations  Other (comment) (Defer to next venue)    Recommendations for Other Services      Precautions / Restrictions Precautions Precautions: Fall;Other (comment) Recall of Precautions/Restrictions: Impaired Restrictions Weight Bearing Restrictions Per Provider Order: No       Mobility Bed Mobility Overal bed mobility: Needs Assistance Bed Mobility: Supine to Sit     Supine to sit: Supervision     General bed mobility comments: Increased time    Transfers Overall transfer level: Needs assistance Equipment used: Rolling walker (2 wheels) Transfers: Sit to/from Stand, Bed to chair/wheelchair/BSC Sit to Stand: Min assist     Step pivot transfers: Contact guard assist     General transfer comment: Min assist to come to stand, CGA with cues for safety to complete short step pivot to recliner     Balance Overall balance  assessment: Needs assistance, History of Falls Sitting-balance support: No upper extremity supported, Feet supported Sitting balance-Leahy Scale: Good     Standing balance support: Bilateral upper extremity supported, Reliant on assistive device for balance, During functional activity Standing balance-Leahy Scale: Poor Standing balance comment: RW in standing       ADL either performed or assessed with clinical judgement   ADL Overall ADL's : Needs assistance/impaired Eating/Feeding: Set up;Sitting   Grooming: Set up;Sitting     Lower Body Dressing: Minimal assistance;Sit to/from stand Lower Body Dressing Details (indicate cue type and reason): min assist to steady in standing Toilet Transfer: Minimal assistance;Stand-pivot;Rolling walker (2 wheels) Toilet Transfer Details (indicate cue type and reason): Simulated in room, short step pivot to recliner Toileting- Clothing Manipulation and Hygiene: Minimal assistance;Sit to/from stand Toileting - Clothing Manipulation Details (indicate cue type and reason): Assist to steady in standing     Functional mobility during ADLs: Minimal assistance;Rolling walker (2 wheels) General ADL Comments: Pt with limited standing balance and tolerance    Extremity/Trunk Assessment Upper Extremity Assessment Upper Extremity Assessment: Generalized weakness   Lower Extremity Assessment Lower Extremity Assessment: Defer to PT evaluation        Vision   Vision Assessment?: No apparent visual deficits         Communication Communication Communication: No apparent difficulties   Cognition Arousal: Alert Behavior During Therapy: Flat affect Cognition: Cognition impaired     Awareness: Online awareness impaired     Executive functioning impairment (select all impairments): Problem solving, Organization     Following commands: Intact        Cueing   Cueing Techniques: Verbal cues, Gestural cues  General Comments RN present  at the end of the session, OT attempted to provide pt with flutter valve, but the unit does not have any, RN notified of orders    Pertinent Vitals/ Pain       Pain Assessment Pain Assessment: No/denies pain Pain Intervention(s): Monitored during session   Frequency  Min 2X/week        Progress Toward Goals  OT Goals(current goals can now be found in the care plan section)  Progress towards OT goals: Progressing toward goals  Acute Rehab OT Goals Patient Stated Goal: To get rest OT Goal Formulation: With patient Time For Goal Achievement: 02/19/24 Potential to Achieve Goals: Good ADL Goals Pt Will Perform Grooming: with supervision;standing Pt Will Perform Lower Body Dressing: with supervision;sit to/from stand Pt Will Transfer to Toilet: with supervision;ambulating;regular height toilet Pt Will Perform Toileting - Clothing Manipulation and hygiene: with supervision;sit to/from stand  Plan         AM-PAC OT 6 Clicks Daily Activity     Outcome Measure   Help from another person eating meals?: None Help from another person taking care of personal grooming?: A Little Help from another person toileting, which includes using toliet, bedpan, or urinal?: A Little Help from another person bathing (including washing, rinsing, drying)?: A Little Help from another person to put on and taking off regular upper body clothing?: A Little Help from another person to put on and taking off regular lower body clothing?: A Little 6 Click Score: 19    End of Session Equipment Utilized During Treatment: Rolling walker (2 wheels)  OT Visit Diagnosis: Unsteadiness on feet (R26.81);Other abnormalities of gait and mobility (R26.89);Repeated falls (R29.6);Muscle weakness (generalized) (M62.81);History of falling (Z91.81)   Activity Tolerance Patient tolerated treatment well   Patient Left in chair;with call bell/phone within reach;with chair alarm set;with nursing/sitter in room   Nurse  Communication Mobility status        Time: 8465-8451 OT Time Calculation (min): 14 min  Charges: OT General Charges $OT Visit: 1 Visit OT Treatments $Self Care/Home Management : 8-22 mins  Adrianne BROCKS, OT  Acute Rehabilitation Services Office [phone] Secure chat preferred   [name] Savers 02/10/2024, 4:10 PM

## 2024-02-10 NOTE — Progress Notes (Signed)
 OT Cancellation Note  Patient Details Name: Bradley Ferguson MRN: [id number] DOB: 1951/04/13   Cancelled Treatment:    Reason Eval/Treat Not Completed: Patient at procedure or test/ unavailable. Pt is off unit for scheduled procedure. Will follow up as able to.   [name] C, OT  Acute Rehabilitation Services Office [phone] Secure chat preferred   [name] Savers 02/10/2024, 1:42 PM

## 2024-02-10 NOTE — Progress Notes (Signed)
 PHARMACY - ANTICOAGULATION  Pharmacy Consult for heparin  Indication: atrial fibrillation   Allergies  Allergen Reactions   Cymbalta  [Duloxetine  Hcl]     unknown   Effexor [Venlafaxine]     Feels bad   Wellbutrin  [Bupropion ]     sleepy   Zoloft [Sertraline Hcl]     Felt bad    Patient Measurements: Height: 6' 0.99 (185.4 cm) Weight: 67.9 kg (149 lb 11.1 oz) IBW/kg (Calculated) : 79.88 HEPARIN  DW (KG): 65.2  Vital Signs: Temp: 98.1 F (36.7 C) (08/13 0803) Temp Source: Oral (08/13 0803) BP: 107/71 (08/13 0803) Pulse Rate: 83 (08/13 0803)  Labs: Recent Labs    02/08/24 0315 02/09/24 0240 02/10/24 0225  HGB 9.8* 10.3* 9.4*  HCT 31.5* 33.1* 29.9*  PLT 329 311 329  HEPARINUNFRC 0.30 0.70 0.47  CREATININE 1.12 1.07 1.30*    Estimated Creatinine Clearance: 49.3 mL/min (A) (by C-G formula based on SCr of 1.3 mg/dL (H)).   Assessment: 73 y.o. male with h/o Afib, PTA Xarelto  on hold (patient reported not taking anymore but fill history shows recent fills). Heparin  consult.  Heparin  drip 1600 uts/hr with heparin  level 0.47 at goal.  CBC stable  Plan for thoracentesis today - heparin  drip off  Will follow up with CCM for restart timing   Goal of Therapy:  Heparin  level 0.3-0.7 units/ml Monitor platelets by anticoagulation protocol: Yes   Plan:  F/u restart  heparin   1600 units / hr Monitor daily heparin  level and CBC Monitor H&H, plts, signs and symptoms of bleeding    Olam Chalk Pharm.D. CPP, BCPS Clinical Pharmacist [phone] 02/10/2024 10:39 AM

## 2024-02-10 NOTE — H&P (View-Only) (Signed)
 ADMISSION DATE:  02/03/2024  SUBJECTIVE: 73 year old male with a past medical history of limited systemic sclerosis, COPD, ILD, A-fib, latent TB who is presenting to the hospital for further evaluation of syncopal episodes associated with tachycardia, dizziness, lightheadedness and unresolved pneumonia.  Dx w LTBI in 2021, not treated. Screened prior to initiation of Imuran.   Smoker. 1 pk/day. Almost 50 years.   P/w cough x 1 month w 20 kg weight loss.   Imaging  CT chest Aug 2025: reviewed by me. Has a LUL mass measuring 7.5x 5.6 cm. Additional nodule in the LLL. 4L station LN seen. Underlying basal and paraseptal bronchieclatasis and fibrotic changes. No significant GGO.  CT LDCT 2022: did not show LUL mass.  CT July 2025: mass in LUL 4.6x6 cm. LLL smaller than Aug.  CT abdomen showing left psoas muscle nodule. IR can do biopsy if needed. Also, worsening left sided effusion seen compared to prior CT chest.   Today: doing similar. Plan for thoracentesis today.   REVIEW OF SYSTEMS:   Pertinent ROS as per HPI  VITAL SIGNS: Temp:  [97.4 F (36.3 C)-98.1 F (36.7 C)] 97.4 F (36.3 C) (08/13 1255) Pulse Rate:  [59-103] 91 (08/13 1255) Resp:  [14-20] 19 (08/13 1255) BP: (93-146)/(58-119) 111/88 (08/13 1255) SpO2:  [90 %-99 %] 98 % (08/13 1255)  PHYSICAL EXAMINATION: General: cachetic.  Neuro:  axo3 Cardiovascular. RRR no murmur.  Lungs:  lung clear. Diminished BS on the right base.  Abdomen:  NT ND  Recent Labs  Lab 02/08/24 0315 02/09/24 0240 02/10/24 0225  NA 140 137 136  K 3.7 3.4* 3.5  CL 101 94* 92*  CO2 29 33* 33*  BUN 18 18 21   CREATININE 1.12 1.07 1.30*  GLUCOSE 136* 99 87   Recent Labs  Lab 02/08/24 0315 02/09/24 0240 02/10/24 0225  HGB 9.8* 10.3* 9.4*  HCT 31.5* 33.1* 29.9*  WBC 21.6* 18.9* 19.9*  PLT 329 311 329   CT ABDOMEN PELVIS W CONTRAST Result Date: 02/09/2024 CLINICAL DATA:  Persistent left upper lobe consolidative mass and left lower  lobe nodule, suspicious for malignancy. Staging. * Tracking Code: BO * EXAM: CT ABDOMEN AND PELVIS WITH CONTRAST TECHNIQUE: Multidetector CT imaging of the abdomen and pelvis was performed using the standard protocol following bolus administration of intravenous contrast. RADIATION DOSE REDUCTION: This exam was performed according to the departmental dose-optimization program which includes automated exposure control, adjustment of the mA and/or kV according to patient size and/or use of iterative reconstruction technique. CONTRAST:  75mL OMNIPAQUE  IOHEXOL  350 MG/ML SOLN COMPARISON:  CTA abdomen and pelvis dated 05/29/2023, CT chest dated 01/29/2024 FINDINGS: Lower chest: Partially imaged posterior left upper lobe mass-like consolidation extending to the hilum and encasing the left upper lobe airways. Interval increased left lower lobe volume loss with suggestion of heterogeneous consolidation. Partially imaged fibrotic changes of the right lung. Increased left pleural effusion, which appears at least partially loculated. Partially imaged heterogeneously enhancing, nodular pleural thickening, for example 5.5 x 1.7 cm at the left lung base (3:28). Partially imaged multichamber cardiomegaly. Partially imaged mediastinal lymphadenopathy, which appears slightly increased in size, for example 12 mm (3: 1), previously 9 mm. Coronary artery calcifications. Hepatobiliary: Ill-defined hypoattenuating focus adjacent to the falciform ligament in segment 4 measuring 2.0 x 1.4 cm (3:45) is in a location typical for focal fatty infiltration, however is new from 05/29/2023. Unchanged subcentimeter segment 2 hypodensities, too small to characterize, but likely cysts. No intra or extrahepatic biliary  ductal dilation. Normal gallbladder. Pancreas: No focal lesions or main ductal dilation. Spleen: Normal in size without focal abnormality. Adrenals/Urinary Tract: No adrenal nodules. No suspicious renal mass or hydronephrosis. 2 mm  hyperattenuating focus within the proximal left ureter (3:57) without obstruction. No focal bladder wall thickening. Stomach/Bowel: Normal appearance of the stomach. No evidence of bowel wall thickening, distention, or inflammatory changes. Colonic diverticulosis without acute diverticulitis. Normal appendix. Vascular/Lymphatic: Aortic atherosclerosis. Infrarenal abdominal aortic aneurysm measures 3.2 x 3.1 cm, unchanged. Similar occlusion of the proximal left femoral artery. Prominent left retrocrural lymph node measures 7 mm (3:34). Reproductive: Enlargement of the prostate with median lobe hypertrophy. Other: No free fluid, fluid collection, or free air. New, heterogeneously enhancing nodule lateral to the left psoas muscle measures 1.3 x 1.2 cm (3:63). Musculoskeletal: No acute or abnormal lytic or blastic osseous lesions. IMPRESSION: 1. Partially imaged posterior left upper lobe mass-like consolidation extending to the hilum and encasing the left upper lobe airways, highly suspicious for malignancy. Interval increased left lower lobe volume loss with suggestion of heterogeneous consolidation, which may reflect atelectasis or postobstructive pneumonia. 2. Increased left pleural effusion, which appears at least partially loculated. Partially imaged heterogeneously enhancing, nodular pleural thickening, suspicious for pleural metastases. 3. Partially imaged mediastinal lymphadenopathy, which appears slightly increased in size, suspicious for metastasis. 4. New, heterogeneously enhancing nodule lateral to the left psoas muscle measures 1.3 x 1.2 cm, suspicious for metastatic disease. 5. Ill-defined hypoattenuating focus adjacent to the falciform ligament in hepatic segment 4 is in a location typical for focal fatty infiltration, however is new from 05/29/2023, indeterminate. 6. A 2 mm hyperattenuating focus within the proximal left ureter without obstruction, which may represent a nonobstructing calculus. 7.  Unchanged infrarenal abdominal aortic aneurysm measures 3.2 cm. 8. Aortic Atherosclerosis (ICD10-I70.0). Coronary artery calcifications. Assessment for potential risk factor modification, dietary therapy or pharmacologic therapy may be warranted, if clinically indicated. Electronically Signed   By: Limin  Xu M.D.   On: 02/09/2024 08:55    STUDIES:  CXR/CT scan/ECHO  CT chest Aug 2025: reviewed by me. Has a LUL mass measuring 7.5x 5.6 cm. Additional nodule in the LLL. 4L station LN seen. Underlying basal and paraseptal bronchieclatasis and fibrotic changes. No significant GGO.  CT LDCT 2022: did not show LUL mass.  CT July 2025: mass in LUL 4.6x6 cm. LLL smaller than Aug.  CT abdomen Aug 2025: left Psoas muscle lesion (per radiologist concerning for met) and worsening left sided effusion.   ASSESSMENT / PLAN:  LUL mass w LLL nodule ILD likely 2/2 to limited SS Left Effusion.  Left psoas muscle lesion r/o malignant nodule.   LUL mass concerning for malignancy as outlined on my note from 8/11. Left effusion could be 2/2 to malignancy.   POCUS on 8/12 showing left sided simple mod-large effusion.   - Cont zosyn  and doxy. Abx for 7 days unless cultures/ID studies positive are positive.  - blasto, histo, fungitell, galactomanan- pending.  - MRSA, strep PNA- neg. Legionella pending.  - ESR and CRP- elevated.  - AFB and sputum cultures pending.  - plan for thoracentesis today. If positive for malignant cells and enough tissue on cell block may not need further investigation. If neg for malignant cells may need to target left psoas muscle lesion for biopsy.  - tentatively planned for robotic bronch on 8/19 w Dr Shelah.  - will follow the patient for ILD management as oupt. No changes to the regimen for now. Can continue w home breztri .  -  can resume heparin  gtt post procedure. Will get CXR post procedure.   Pulm will follow cytology for further planning.   Sammi JONETTA Fredericks, MD Pulmonary,  Critical Care & Sleep Medicine [facility] Pager: [phone]  02/10/2024, 1:32 PM

## 2024-02-10 NOTE — Progress Notes (Signed)
 PT Cancellation Note  Patient Details Name: Bradley Ferguson MRN: [id number] DOB: 1950-09-12   Cancelled Treatment:    Reason Eval/Treat Not Completed: Fatigue/lethargy limiting ability to participate;Patient at procedure or test/unavailable. Pt declined due to fatigue in morning, asleep on 2nd attempt, and now off unit for thoracentesis. Will continue to follow and progress as time/schedule allow.   Izetta Call, PT, DPT   Acute Rehabilitation Department Office [phone] Secure Chat Communication Preferred   Izetta JULIANNA Call 02/10/2024, 2:14 PM

## 2024-02-10 NOTE — Progress Notes (Signed)
 PHARMACY - ANTICOAGULATION  Pharmacy Consult for heparin  Indication: atrial fibrillation   Allergies  Allergen Reactions   Cymbalta  [Duloxetine  Hcl]     unknown   Effexor [Venlafaxine]     Feels bad   Wellbutrin  [Bupropion ]     sleepy   Zoloft [Sertraline Hcl]     Felt bad    Patient Measurements: Height: 6' 0.99 (185.4 cm) Weight: 67.9 kg (149 lb 11.1 oz) IBW/kg (Calculated) : 79.88 HEPARIN  DW (KG): 65.2  Vital Signs: Temp: 97.4 F (36.3 C) (08/13 1255) Temp Source: Temporal (08/13 1255) BP: 144/73 (08/13 1600) Pulse Rate: 89 (08/13 1600)  Labs: Recent Labs    02/08/24 0315 02/09/24 0240 02/10/24 0225  HGB 9.8* 10.3* 9.4*  HCT 31.5* 33.1* 29.9*  PLT 329 311 329  HEPARINUNFRC 0.30 0.70 0.47  CREATININE 1.12 1.07 1.30*    Estimated Creatinine Clearance: 49.3 mL/min (A) (by C-G formula based on SCr of 1.3 mg/dL (H)).   Assessment: 73 y.o. male with h/o Afib, PTA Xarelto  on hold (patient reported not taking anymore but fill history shows recent fills). Heparin  consult.  Heparin  drip 1600 uts/hr with heparin  level 0.47 at goal.  CBC stable   S/p thoracentesis - okay to restart heparin  per CCM.  Goal of Therapy:  Heparin  level 0.3-0.7 units/ml Monitor platelets by anticoagulation protocol: Yes   Plan:  Restart heparin   1600 units / hr Monitor daily heparin  level and CBC Monitor H&H, plts, signs and symptoms of bleeding  Vito Ralph, PharmD, BCPS Please see amion for complete clinical pharmacist phone list 02/10/2024 6:01 PM

## 2024-02-11 ENCOUNTER — Encounter (HOSPITAL_COMMUNITY): Payer: Self-pay

## 2024-02-11 DIAGNOSIS — R6520 Severe sepsis without septic shock: Secondary | ICD-10-CM | POA: Diagnosis not present

## 2024-02-11 DIAGNOSIS — A419 Sepsis, unspecified organism: Secondary | ICD-10-CM | POA: Diagnosis not present

## 2024-02-11 LAB — COMPREHENSIVE METABOLIC PANEL WITH GFR
ALT: 16 U/L (ref 0–44)
AST: 24 U/L (ref 15–41)
Albumin: 1.7 g/dL — ABNORMAL LOW (ref 3.5–5.0)
Alkaline Phosphatase: 86 U/L (ref 38–126)
Anion gap: 5 (ref 5–15)
BUN: 19 mg/dL (ref 8–23)
CO2: 26 mmol/L (ref 22–32)
Calcium: 8 mg/dL — ABNORMAL LOW (ref 8.9–10.3)
Chloride: 105 mmol/L (ref 98–111)
Creatinine, Ser: 1.07 mg/dL (ref 0.61–1.24)
GFR, Estimated: >60 mL/min (ref >60)
Glucose, Bld: 81 mg/dL (ref 70–99)
Potassium: 4.8 mmol/L (ref 3.5–5.1)
Sodium: 136 mmol/L (ref 135–145)
Total Bilirubin: 0.4 mg/dL (ref 0.0–1.2)
Total Protein: 4.7 g/dL — ABNORMAL LOW (ref 6.5–8.1)

## 2024-02-11 LAB — CBC
HCT: 26.8 % — ABNORMAL LOW (ref 39.0–52.0)
Hemoglobin: 8.4 g/dL — ABNORMAL LOW (ref 13.0–17.0)
MCH: 25.6 pg — ABNORMAL LOW (ref 26.0–34.0)
MCHC: 31.3 g/dL (ref 30.0–36.0)
MCV: 81.7 fL (ref 80.0–100.0)
Platelets: 284 K/uL (ref 150–400)
RBC: 3.28 MIL/uL — ABNORMAL LOW (ref 4.22–5.81)
RDW: 18.9 % — ABNORMAL HIGH (ref 11.5–15.5)
WBC: 18.4 K/uL — ABNORMAL HIGH (ref 4.0–10.5)
nRBC: 0 % (ref 0.0–0.2)

## 2024-02-11 LAB — FUNGITELL BETA-D-GLUCAN: Fungitell Value:: 37.518 pg/mL

## 2024-02-11 LAB — BLASTOMYCES ANTIGEN
Blastomyces Antigen: NOT DETECTED ng/mL
Interpretation: NEGATIVE

## 2024-02-11 LAB — HEPARIN LEVEL (UNFRACTIONATED)
Heparin Unfractionated: >1.1 [IU]/mL — ABNORMAL HIGH (ref 0.30–0.70)
Heparin Unfractionated: 0.43 [IU]/mL (ref 0.30–0.70)

## 2024-02-11 LAB — HISTOPLASMA ANTIGEN, URINE: Histoplasma Antigen, urine: NEGATIVE (ref <0.2)

## 2024-02-11 LAB — LEGIONELLA PNEUMOPHILA SEROGP 1 UR AG: L. pneumophila Serogp 1 Ur Ag: NEGATIVE

## 2024-02-11 LAB — MAGNESIUM: Magnesium: 1.5 mg/dL — ABNORMAL LOW (ref 1.7–2.4)

## 2024-02-11 MED ORDER — MAGNESIUM SULFATE 2 GM/50ML IV SOLN
2.0000 g | Freq: Once | INTRAVENOUS | Status: AC
  Administered 2024-02-11: 2 g via INTRAVENOUS
  Filled 2024-02-11: qty 50

## 2024-02-11 MED ORDER — MAGNESIUM SULFATE 2 GM/50ML IV SOLN: 2.0000 g | Freq: Once | INTRAVENOUS | Status: DC

## 2024-02-11 NOTE — Plan of Care (Signed)

## 2024-02-11 NOTE — Progress Notes (Signed)
 Per Dr. Pawar schedule this patient for 8/19 at 1515. Per Dr. Theodoro patient aware

## 2024-02-11 NOTE — Progress Notes (Signed)
 PROGRESS NOTE  Bradley Ferguson FMW:[id number] DOB: 02-17-1951 DOA: 02/03/2024 PCP: Sebastian Beverley NOVAK, MD   LOS: 8 days   Brief Narrative / Interim history: 73 y.o. male with medical history significant of patient with history of COPD, interstitial lung disease, patient has latent tuberculosis, atrial fibrillation on Tikosyn  and Xarelto , peripheral arterial disease, essential hypertension, anxiety disorder depression, history of hematuria with lung consolidation was on antibiotics by pulmonary.  Patient went to see pulmonary today due to near syncope and productive cough.  He was found to have significant dyspnea and tachycardia with heart rate in the 120s.  Patient noted to have oxygen saturation 90% on room air.  He was however noted to have hypercalcemia, leukocytosis with left shift and lactic acidosis.   Subjective / 24h Interval events: Overall feeling better, had thoracentesis yesterday  Assesement and Plan: Principal Problem:   Severe sepsis (HCC) Active Problems:   ANXIETY DEPRESSION   Bipolar I disorder (HCC)   Atrial fibrillation (HCC)   Peripheral arterial disease (HCC)   Chronic obstructive pulmonary disease (HCC)   Interstitial pulmonary disease (HCC)   TB lung, latent   Essential hypertension   Hypercalcemia  Principal problem Acute hypoxic respiratory failure in the setting of left upper lobe consolidation -CT scan on 8/1 showed slightly increase of the left upper lobe dense consolidation, concerning for infection versus malignancy.  He has been having worsening shortness of breath, cough, hemoptysis on presentation.  Pulmonary consulted and following.  Legionella, histoplasma antigen, Fungitell beta D glucan, Blastomyces antigen all pending - Given history of latent TB, he underwent AFB, now negative x 3 - Has been placed on Zosyn , continue.  Active problems Concern for primary lung cancer -given left upper lobe mass, significant hypercalcemia there is a good likelihood  of malignancy.  CT abdomen pelvis with contrast did show a left lateral psoas nodule concerning for metastasis - Postthoracentesis yesterday, cytology pending.  If negative may need IR consult of the left lateral psoas nodule  Hypercalcemia of malignancy-over 15 on presentation.  PTH appropriately low, PTH RP pending.  Received fluids, Lasix , calcium  now improved to 8.0.  Continue to monitor periodically  Acute kidney injury-resolved, creatinine back to baseline  Hypomagnesemia-replenish magnesium   Pulmonary fibrosis, emphysema-contributing to his dyspnea  Persistent A-fib-continue dofetilide , on heparin   Concern for severe sepsis-sepsis physiology improved.  Likely due to his possible upper lobe consolidation  Bipolar 1 disorder-continue home regimen   Scheduled Meds:  bisacodyl   5 mg Oral Once   budesonide -glycopyrrolate -formoterol   2 puff Inhalation BID   cholecalciferol   10,000 Units Oral QPM   dofetilide   500 mcg Oral BID   gabapentin   300 mg Oral TID   polyethylene glycol  17 g Oral Daily   potassium chloride   40 mEq Oral BID   pyridOXINE   100 mg Oral QPM   sorbitol   30 mL Oral Once   Continuous Infusions:  sodium chloride  Stopped (02/11/24 0620)   heparin  1,600 Units/hr (02/11/24 9178)   piperacillin -tazobactam 12.5 mL/hr at 02/11/24 0645   PRN Meds:.albuterol , bisacodyl , bisacodyl , guaiFENesin -dextromethorphan , HYDROcodone -acetaminophen , ketoconazole , triamcinolone  cream  Current Outpatient Medications  Medication Instructions   albuterol  (PROVENTIL ) 2.5 mg, Nebulization, Every 6 hours PRN   albuterol  (VENTOLIN  HFA) 108 (90 Base) MCG/ACT inhaler Use  2 inhalations  15 minutes apart  every 4 hours  to rescue Asthma Attack   atorvastatin  (LIPITOR ) 80 MG tablet Take 1 tablet Daily for Cholesterol   budesonide -glycopyrrolate -formoterol  (BREZTRI  AEROSPHERE) 160-9-4.8 MCG/ACT AERO inhaler 2 puffs, Inhalation, 2  times daily   Cholecalciferol  10,000 Units, Every evening    dofetilide  (TIKOSYN ) 500 mcg, Oral, 2 times daily   gabapentin  (NEURONTIN ) 300 MG capsule Take 1 capsule 3 x /day for Neuropathy Pain   ketoconazole  (NIZORAL ) 2 % cream 1 Application, Daily PRN   oxyCODONE  (OXY IR/ROXICODONE ) 5 mg, Every 4 hours PRN   pyridOXINE  (VITAMIN B6) 100 mg, Every evening   rivaroxaban  (XARELTO ) 20 mg, Oral, Daily with supper   triamcinolone  cream (KENALOG ) 0.5 % 1 Application, Daily PRN    Diet Orders (From admission, onward)     Start     Ordered   02/03/24 1943  Diet Heart Room service appropriate? Yes; Fluid consistency: Thin  Diet effective now       Question Answer Comment  Room service appropriate? Yes   Fluid consistency: Thin      02/03/24 1944            DVT prophylaxis:    Lab Results  Component Value Date   PLT 284 02/11/2024      Code Status: Full Code  Family Communication: no family at bedside   Status is: Inpatient Remains inpatient appropriate because: Severity of illness   Level of care: Progressive  Consultants:  PCCM  Objective: Vitals:   02/10/24 2348 02/11/24 0300 02/11/24 0758 02/11/24 1111  BP: 115/63 124/61 111/64 121/68  Pulse: 83  79 79  Resp: 18 16 18    Temp: 97.9 F (36.6 C) 98.2 F (36.8 C) 98.4 F (36.9 C) 98.6 F (37 C)  TempSrc: Oral Oral Oral Oral  SpO2: 95% 99% 94% 99%  Weight:      Height:        Intake/Output Summary (Last 24 hours) at 02/11/2024 1155 Last data filed at 02/11/2024 1111 Gross per 24 hour  Intake 2751.09 ml  Output 1475 ml  Net 1276.09 ml   Wt Readings from Last 3 Encounters:  02/08/24 67.9 kg  01/21/24 68.9 kg  01/19/24 68 kg    Examination:  Constitutional: NAD Eyes: lids and conjunctivae normal, no scleral icterus ENMT: mmm Neck: normal, supple Respiratory: clear to auscultation bilaterally, no wheezing, no crackles.  Cardiovascular: Regular rate and rhythm, no murmurs / rubs / gallops.  Abdomen: soft, no distention, no tenderness. Bowel sounds positive.     Data Reviewed: I have independently reviewed following labs and imaging studies   CBC Recent Labs  Lab 02/07/24 0300 02/08/24 0315 02/09/24 0240 02/10/24 0225 02/11/24 0259  WBC 21.8* 21.6* 18.9* 19.9* 18.4*  HGB 9.4* 9.8* 10.3* 9.4* 8.4*  HCT 29.3* 31.5* 33.1* 29.9* 26.8*  PLT 319 329 311 329 284  MCV 80.3 81.6 81.7 80.8 81.7  MCH 25.8* 25.4* 25.4* 25.4* 25.6*  MCHC 32.1 31.1 31.1 31.4 31.3  RDW 18.4* 18.8* 18.8* 18.6* 18.9*    Recent Labs  Lab 02/07/24 0300 02/08/24 0315 02/08/24 1650 02/09/24 0240 02/10/24 0225 02/11/24 0259  NA 139 140  --  137 136 136  K 3.2* 3.7  --  3.4* 3.5 4.8  CL 103 101  --  94* 92* 105  CO2 25 29  --  33* 33* 26  GLUCOSE 126* 136*  --  99 87 81  BUN 12 18  --  18 21 19   CREATININE 0.94 1.12  --  1.07 1.30* 1.07  CALCIUM  13.3* 12.9*  --  11.4* 10.5* 8.0*  AST  --   --   --   --   --  24  ALT  --   --   --   --   --  16  ALKPHOS  --   --   --   --   --  86  BILITOT  --   --   --   --   --  0.4  ALBUMIN   --   --   --   --   --  1.7*  MG 1.6* 2.0  --  1.6* 2.0 1.5*  CRP  --   --  14.6*  --   --   --     ------------------------------------------------------------------------------------------------------------------ No results for input(s): CHOL, HDL, LDLCALC, TRIG, CHOLHDL, LDLDIRECT in the last 72 hours.  Lab Results  Component Value Date   HGBA1C 5.9 (H) 07/06/2023   ------------------------------------------------------------------------------------------------------------------ No results for input(s): TSH, T4TOTAL, T3FREE, THYROIDAB in the last 72 hours.  Invalid input(s): FREET3  Cardiac Enzymes No results for input(s): CKMB, TROPONINI, MYOGLOBIN in the last 168 hours.  Invalid input(s): CK ------------------------------------------------------------------------------------------------------------------ No results found for: BNP  CBG: Recent Labs  Lab 02/10/24 1620  GLUCAP 107*     Recent Results (from the past 240 hours)  Culture, blood (Routine X 2) w Reflex to ID Panel     Status: None   Collection Time: 02/03/24  3:49 PM   Specimen: BLOOD  Result Value Ref Range Status   Specimen Description   Final    BLOOD LEFT ANTECUBITAL Performed at [facility], [address] [place], [place] [address]    Special Requests   Final    BOTTLES DRAWN AEROBIC AND ANAEROBIC Blood Culture adequate volume Performed at [facility], [address], [place], [place] [address]    Culture   Final    NO GROWTH 5 DAYS Performed at [facility], [address] [place], [place] [address]    Report Status 02/08/2024 FINAL  Final  Culture, blood (Routine X 2) w Reflex to ID Panel     Status: None   Collection Time: 02/03/24  3:53 PM   Specimen: BLOOD  Result Value Ref Range Status   Specimen Description   Final    BLOOD RIGHT ANTECUBITAL Performed at [facility], [address], [place], [place] [address]    Special Requests   Final    BOTTLES DRAWN AEROBIC AND ANAEROBIC Blood Culture adequate volume Performed at [facility], [address] [place], [place] [address]    Culture   Final    NO GROWTH 5 DAYS Performed at [facility], [address] [place], [place] [address]    Report Status 02/08/2024 FINAL  Final  SARS Coronavirus 2 by RT PCR (hospital order, performed in [facility] hospital lab) *cepheid single result test* Anterior Nasal Swab     Status: None   Collection Time: 02/03/24  3:56 PM   Specimen: Anterior Nasal Swab  Result Value Ref Range Status   SARS Coronavirus 2 by RT PCR NEGATIVE NEGATIVE Final    Comment: (NOTE) SARS-CoV-2 target nucleic acids are NOT DETECTED.  The SARS-CoV-2 RNA is generally detectable in upper and lower respiratory specimens during the acute phase of infection. The lowest concentration of SARS-CoV-2 viral copies this assay can  detect is 250 copies / mL. A negative result does not preclude SARS-CoV-2 infection and should not be used as the sole basis for treatment or other patient management decisions.  A negative result may occur with improper specimen collection / handling, submission of specimen other than nasopharyngeal swab, presence of viral mutation(s) within the areas targeted by this assay, and  inadequate number of viral copies (<250 copies / mL). A negative result must be combined with clinical observations, patient history, and epidemiological information.  Fact Sheet for Patients:   RoadLapTop.co.za  Fact Sheet for Healthcare Providers: http://kim-miller.com/  This test is not yet approved or  cleared by the [place]  FDA and has been authorized for detection and/or diagnosis of SARS-CoV-2 by FDA under an Emergency Use Authorization (EUA).  This EUA will remain in effect (meaning this test can be used) for the duration of the COVID-19 declaration under Section 564(b)(1) of the Act, 21 U.S.C. section 360bbb-3(b)(1), unless the authorization is terminated or revoked sooner.  Performed at [facility], [address], [place], [place] [address]   Acid Fast Smear (AFB)     Status: None   Collection Time: 02/04/24  1:31 PM   Specimen: Sputum  Result Value Ref Range Status   AFB Specimen Processing Concentration  Final   Acid Fast Smear Negative  Final    Comment: (NOTE) Performed At: [facility] [address] [place], [place] [id number] Jennette Shorter MD Ey:[id number]    Source (AFB) EXPECTORATED SPUTUM  Final    Comment: Performed at [facility], [address], [place], [place] [address]  Acid Fast Smear (AFB)     Status: None   Collection Time: 02/04/24  1:32 PM   Specimen: Sputum  Result Value Ref Range Status   AFB Specimen Processing Concentration  Final   Acid Fast Smear Negative  Final     Comment: (NOTE) Performed At: [facility] [address] [place], [place] [id number] Jennette Shorter MD Ey:[id number]    Source (AFB) EXPECTORATED SPUTUM  Final    Comment: Performed at [facility], [address] [place], [place] [address]  MTB-RIF NAA with AFB Culture, sputum     Status: None (Preliminary result)   Collection Time: 02/04/24  1:39 PM  Result Value Ref Range Status   Myco tuberculosis Complex NOT DETECTED NOT DETECTED Final   Rifampin Not applicable NOT DETECTED Final   AFB Specimen Processing Concentration  Final    Comment: (NOTE) Performed At: [facility] [address] [place], [place] [id number] Jennette Shorter MD Ey:[id number]    Acid Fast Culture PENDING  Incomplete   Source (MTB RIF) EXSPU  Final    Comment: Performed at [facility], [address] [place], [place] [address]  Expectorated Sputum Assessment w Gram Stain, Rflx to Resp Cult     Status: None   Collection Time: 02/04/24  1:40 PM   Specimen: INDUCED SPUTUM  Result Value Ref Range Status   Specimen Description INDUCED SPUTUM  Final   Special Requests NONE  Final   Sputum evaluation   Final    THIS SPECIMEN IS ACCEPTABLE FOR SPUTUM CULTURE Performed at [facility], [address] [place], [place] [address]    Report Status 02/04/2024 FINAL  Final  Culture, Respiratory w Gram Stain     Status: None   Collection Time: 02/04/24  1:40 PM   Specimen: INDUCED SPUTUM  Result Value Ref Range Status   Specimen Description INDUCED SPUTUM  Final   Special Requests NONE Reflexed from [id number]  Final   Gram Stain   Final    FEW WBC PRESENT, PREDOMINANTLY PMN RARE BUDDING YEAST SEEN    Culture   Final    RARE CANDIDA GLABRATA RARE Normal respiratory flora-no Staph aureus or Pseudomonas seen Performed at [facility], [address] [place], [place] [address]    Report Status 02/07/2024 FINAL  Final  Acid Fast Smear (AFB)     Status: None   Collection  Time: 02/05/24  2:25 PM  Result Value Ref Range Status   AFB Specimen Processing Concentration  Final   Acid Fast Smear Negative  Final    Comment: (NOTE) Performed At: [facility] [address] [place], [place] [id number] Jennette Shorter MD Ey:[id number]    Source (AFB) INDUCED SPUTUM  Final    Comment: Performed at [facility], [address], [place], [place] [address]  Acid Fast Smear (AFB)     Status: None   Collection Time: 02/06/24  9:38 AM   Specimen: Sputum  Result Value Ref Range Status   AFB Specimen Processing Concentration  Final   Acid Fast Smear Negative  Final    Comment: (NOTE) Performed At: [facility] [address] [place], [place] [id number] Jennette Shorter MD Ey:[id number]    Source (AFB) SPU  Final    Comment: Performed at [facility], [address], [place], [place] [address]  MRSA Next Gen by PCR, Nasal     Status: None   Collection Time: 02/08/24  3:42 PM   Specimen: Nasal Mucosa; Nasal Swab  Result Value Ref Range Status   MRSA by PCR Next Gen NOT DETECTED NOT DETECTED Final    Comment: (NOTE) The GeneXpert MRSA Assay (FDA approved for NASAL specimens only), is one component of a comprehensive MRSA colonization surveillance program. It is not intended to diagnose MRSA infection nor to guide or monitor treatment for MRSA infections. Test performance is not FDA approved in patients less than 50 years old. Performed at [facility], [address] [place], [place] [address]   Blastomyces Antigen     Status: None   Collection Time: 02/08/24  4:50 PM   Specimen: Blood  Result Value Ref Range Status   Blastomyces Antigen None Detected None Detected ng/mL Final    Comment: (NOTE) Reference Interval: None Detected Reportable Range: 0.31 ng/mL - 20.00 ng/mL Results above 20.00 ng/mL are reported as 'Positive, Above the Limit of Quantification' This test was developed and its performance  characteristics determined by [employer]. It has not been cleared or approved by the FDA; however, FDA clearance or approval is not currently required for clinical use. The results are not intended to be used as the sole means for clinical diagnosis or patient decisions.    Interpretation Negative  Final   Specimen Type SERUM  Final    Comment: (NOTE) Performed At: [facility] [address] [place], [place] [id number] [place] Pac MD Ey:[id number]   Body fluid culture w Gram Stain     Status: None (Preliminary result)   Collection Time: 02/10/24  1:32 PM   Specimen: Pleural Fluid  Result Value Ref Range Status   Specimen Description PLEURAL  Final   Special Requests NONE  Final   Gram Stain   Final    WBC PRESENT, PREDOMINANTLY MONONUCLEAR NO ORGANISMS SEEN CYTOSPIN SMEAR    Culture   Final    NO GROWTH < 24 HOURS Performed at [facility], [address], [place], [place] [address]    Report Status PENDING  Incomplete     Radiology Studies: DG CHEST PORT 1 VIEW Result Date: 02/10/2024 CLINICAL DATA:  Post thoracentesis EXAM: PORTABLE CHEST 1 VIEW COMPARISON:  Chest x-ray 02/03/2024. FINDINGS: Small left  pleural effusion appears unchanged. There is no evidence for pneumothorax. Airspace consolidation with central cavitation in the left upper lobe appears unchanged. Additional scattered interstitial opacities throughout both lungs appear unchanged. Cardiomediastinal silhouette is within normal limits. The osseous structures are stable. IMPRESSION: 1. No evidence for pneumothorax. 2. Stable small left pleural effusion. 3. Stable airspace consolidation with central cavitation in the left upper lobe. Electronically Signed   By: Greig Pique M.D.   On: 02/10/2024 15:51     Nilda Fendt, MD, PhD [facility]  Between 7 am - 7 pm I am available, please contact me via Amion (for emergencies) or Securechat (non urgent messages)  Between 7  pm - 7 am I am not available, please contact night coverage MD/APP via Amion

## 2024-02-11 NOTE — Progress Notes (Signed)
 Physical Therapy Treatment Patient Details Name: Bradley Ferguson MRN: [id number] DOB: April 24, 1951 Today's Date: 02/11/2024   History of Present Illness 73 y.o. male admitted 02/03/24 for near syncope at pulmonologist appt. 8/1 CT with increase in size of dense masslike consolidation & pleural effusion. PMH: COPD, ILD, Afib, HTN, PAD s/p bil fem endarerectomy, HLD, raynaud's syndrome, latent tuberculosis, tobacco abuse    PT Comments  The pt was able to progress to gait training today, demonstrating improved activity tolerance. He was able to ambulate up to ~60 ft with a RW and minA for balance before fatiguing and needing to rest. He needed cues to remain proximal to his RW and keep his feet medially within the RW when ambulating. He displays continued deficits in endurance, balance, and strength that place him at risk for falls. Educated pt on importance of frequent mobility, sitting up, and using an IS (provided to him and educated him on use and frequency of use) to try to improve his pulmonary function. He verbalized understanding. Will continue to follow acutely.    If plan is discharge home, recommend the following: A little help with walking and/or transfers;A little help with bathing/dressing/bathroom;Assistance with cooking/housework;Assist for transportation;Help with stairs or ramp for entrance   Can travel by private vehicle     Yes  Equipment Recommendations  Other (comment) (defer to next venue)    Recommendations for Other Services       Precautions / Restrictions Precautions Precautions: Fall;Other (comment) Recall of Precautions/Restrictions: Impaired Precaution/Restrictions Comments: watch sats Restrictions Weight Bearing Restrictions Per Provider Order: No     Mobility  Bed Mobility Overal bed mobility: Needs Assistance Bed Mobility: Supine to Sit     Supine to sit: Supervision, HOB elevated     General bed mobility comments: Supervision for safety with pt  transitioning supine to sit L EOB, HOB elevated    Transfers Overall transfer level: Needs assistance Equipment used: Rolling walker (2 wheels) Transfers: Sit to/from Stand Sit to Stand: Min assist           General transfer comment: Cues provided for hand placement with transfers, minA for powering up and steadying when transferring sit to stand from low EOB    Ambulation/Gait Ambulation/Gait assistance: Min assist Gait Distance (Feet): 60 Feet Assistive device: Rolling walker (2 wheels) Gait Pattern/deviations: Step-through pattern, Decreased step length - right, Decreased step length - left, Decreased stride length, Trunk flexed Gait velocity: reduced Gait velocity interpretation: <1.8 ft/sec, indicate of risk for recurrent falls   General Gait Details: Pt ambulates with a flexed posture and decreased bil step length. Cued pt to keep feet proximally within the RW as his L foot would intermittently get lateral to the L posterior RW leg. Verbal and tactile cues provided to stand upright. MinA for balance   Stairs             Wheelchair Mobility     Tilt Bed    Modified Rankin (Stroke Patients Only)       Balance Overall balance assessment: Needs assistance, History of Falls Sitting-balance support: No upper extremity supported, Feet supported Sitting balance-Leahy Scale: Good     Standing balance support: Bilateral upper extremity supported, Reliant on assistive device for balance, During functional activity Standing balance-Leahy Scale: Poor Standing balance comment: reliant on RW                            Communication Communication Communication: No apparent  difficulties  Cognition Arousal: Alert Behavior During Therapy: WFL for tasks assessed/performed   PT - Cognitive impairments: No apparent impairments                       PT - Cognition Comments: Pt joking today. Agreeable for mobility progression. Follows cues,  intermittently with slightly increased time Following commands: Intact      Cueing Cueing Techniques: Verbal cues, Gestural cues, Tactile cues  Exercises Other Exercises Other Exercises: Used IS while sitting in chair, x1 rep with PT; PT provided pt with IS and educated him on how to use it and frequency of use    General Comments General comments (skin integrity, edema, etc.): SpO2 unreliable when ambulating on 4L, reading in the 70s%, when sitting and resting after ambulating it did appear to read in the 80s% with fair reliability, 90s% on RA at rest though; provided pt with IS and educated him on use and frequency of use to try to improve his pulmonary function      Pertinent Vitals/Pain Pain Assessment Pain Assessment: Faces Faces Pain Scale: Hurts a little bit Pain Location: back Pain Descriptors / Indicators: Other (Comment) (itches) Pain Intervention(s): Monitored during session, Limited activity within patient's tolerance    Home Living                          Prior Function            PT Goals (current goals can now be found in the care plan section) Acute Rehab PT Goals Patient Stated Goal: be able to walk PT Goal Formulation: With patient Time For Goal Achievement: 02/19/24 Potential to Achieve Goals: Good Progress towards PT goals: Progressing toward goals    Frequency    Min 2X/week      PT Plan      Co-evaluation              AM-PAC PT 6 Clicks Mobility   Outcome Measure  Help needed turning from your back to your side while in a flat bed without using bedrails?: A Little Help needed moving from lying on your back to sitting on the side of a flat bed without using bedrails?: A Little Help needed moving to and from a bed to a chair (including a wheelchair)?: A Little Help needed standing up from a chair using your arms (e.g., wheelchair or bedside chair)?: A Little Help needed to walk in hospital room?: A Little Help needed  climbing 3-5 steps with a railing? : Total 6 Click Score: 16    End of Session Equipment Utilized During Treatment: Gait belt;Oxygen Activity Tolerance: Patient tolerated treatment well Patient left: in chair;with call bell/phone within reach;with chair alarm set   PT Visit Diagnosis: Other abnormalities of gait and mobility (R26.89);Difficulty in walking, not elsewhere classified (R26.2);Muscle weakness (generalized) (M62.81);Unsteadiness on feet (R26.81)     Time: 8984-8947 PT Time Calculation (min) (ACUTE ONLY): 37 min  Charges:    $Gait Training: 8-22 mins $Therapeutic Activity: 8-22 mins PT General Charges $$ ACUTE PT VISIT: 1 Visit                     Theo Ferretti, PT, DPT Acute Rehabilitation Services  Office: [phone]    Theo CHRISTELLA Ferretti 02/11/2024, 12:39 PM

## 2024-02-11 NOTE — Progress Notes (Signed)
 PHARMACY - ANTICOAGULATION  Pharmacy Consult for heparin  Indication: atrial fibrillation   Allergies  Allergen Reactions   Cymbalta  [Duloxetine  Hcl]     unknown   Effexor [Venlafaxine]     Feels bad   Wellbutrin  [Bupropion ]     sleepy   Zoloft [Sertraline Hcl]     Felt bad    Patient Measurements: Height: 6' 0.99 (185.4 cm) Weight: 67.9 kg (149 lb 11.1 oz) IBW/kg (Calculated) : 79.88 HEPARIN  DW (KG): 65.2  Vital Signs: Temp: 98.6 F (37 C) (08/14 1111) Temp Source: Oral (08/14 1111) BP: 121/68 (08/14 1111) Pulse Rate: 79 (08/14 1111)  Labs: Recent Labs    02/09/24 0240 02/10/24 0225 02/11/24 0259 02/11/24 1029  HGB 10.3* 9.4* 8.4*  --   HCT 33.1* 29.9* 26.8*  --   PLT 311 329 284  --   HEPARINUNFRC 0.70 0.47 >1.10* 0.43  CREATININE 1.07 1.30* 1.07  --     Estimated Creatinine Clearance: 59.9 mL/min (by C-G formula based on SCr of 1.07 mg/dL).   Assessment: 73 y.o. male with h/o Afib, PTA Xarelto  on hold (patient reported not taking anymore but fill history shows recent fills). Heparin  consult.  Heparin  drip 1600 uts/hr with heparin  level 0.43 @ 1029 at goal.  CBC down trending but no s/s of bleeding per RN.  Level from 0259 of >1.10 was drawn from heparin  site, disregard  Goal of Therapy:  Heparin  level 0.3-0.7 units/ml Monitor platelets by anticoagulation protocol: Yes   Plan:  Continue 1600 units / hr Monitor daily heparin  level and CBC Monitor H&H, plts, signs and symptoms of bleeding  Vito Ralph, PharmD, BCPS Please see amion for complete clinical pharmacist phone list 02/11/2024 11:24 AM

## 2024-02-12 ENCOUNTER — Inpatient Hospital Stay: Admitting: Family Medicine

## 2024-02-12 DIAGNOSIS — R6520 Severe sepsis without septic shock: Secondary | ICD-10-CM | POA: Diagnosis not present

## 2024-02-12 DIAGNOSIS — A419 Sepsis, unspecified organism: Secondary | ICD-10-CM | POA: Diagnosis not present

## 2024-02-12 LAB — CBC
HCT: 28 % — ABNORMAL LOW (ref 39.0–52.0)
Hemoglobin: 9 g/dL — ABNORMAL LOW (ref 13.0–17.0)
MCH: 25.8 pg — ABNORMAL LOW (ref 26.0–34.0)
MCHC: 32.1 g/dL (ref 30.0–36.0)
MCV: 80.2 fL (ref 80.0–100.0)
Platelets: 301 K/uL (ref 150–400)
RBC: 3.49 MIL/uL — ABNORMAL LOW (ref 4.22–5.81)
RDW: 19.4 % — ABNORMAL HIGH (ref 11.5–15.5)
WBC: 22.7 K/uL — ABNORMAL HIGH (ref 4.0–10.5)
nRBC: 0 % (ref 0.0–0.2)

## 2024-02-12 LAB — BASIC METABOLIC PANEL WITH GFR
Anion gap: 11 (ref 5–15)
BUN: 16 mg/dL (ref 8–23)
CO2: 24 mmol/L (ref 22–32)
Calcium: 9.1 mg/dL (ref 8.9–10.3)
Chloride: 99 mmol/L (ref 98–111)
Creatinine, Ser: 1.1 mg/dL (ref 0.61–1.24)
GFR, Estimated: >60 mL/min (ref >60)
Glucose, Bld: 103 mg/dL — ABNORMAL HIGH (ref 70–99)
Potassium: 4.3 mmol/L (ref 3.5–5.1)
Sodium: 134 mmol/L — ABNORMAL LOW (ref 135–145)

## 2024-02-12 LAB — HEPARIN LEVEL (UNFRACTIONATED): Heparin Unfractionated: 0.35 [IU]/mL (ref 0.30–0.70)

## 2024-02-12 LAB — MAGNESIUM: Magnesium: 2.4 mg/dL (ref 1.7–2.4)

## 2024-02-12 MED ORDER — ORAL CARE MOUTH RINSE
15.0000 mL | OROMUCOSAL | Status: DC | PRN
Start: 2024-02-12 — End: 2024-02-29

## 2024-02-12 NOTE — Progress Notes (Addendum)
 PHARMACY - ANTICOAGULATION  Pharmacy Consult for heparin  Indication: atrial fibrillation   Allergies  Allergen Reactions   Cymbalta  [Duloxetine  Hcl]     unknown   Effexor [Venlafaxine]     Feels bad   Wellbutrin  [Bupropion ]     sleepy   Zoloft [Sertraline Hcl]     Felt bad    Patient Measurements: Height: 6' 0.99 (185.4 cm) Weight: 68 kg (149 lb 14.6 oz) IBW/kg (Calculated) : 79.88 HEPARIN  DW (KG): 65.2  Vital Signs: Temp: 98.5 F (36.9 C) (08/15 0723) Temp Source: Oral (08/15 0723) BP: 102/67 (08/15 0723) Pulse Rate: 81 (08/15 0723)  Labs: Recent Labs    02/10/24 0225 02/11/24 0259 02/11/24 1029 02/12/24 0301  HGB 9.4* 8.4*  --  9.0*  HCT 29.9* 26.8*  --  28.0*  PLT 329 284  --  301  HEPARINUNFRC 0.47 >1.10* 0.43 0.35  CREATININE 1.30* 1.07  --  1.10    Estimated Creatinine Clearance: 58.4 mL/min (by C-G formula based on SCr of 1.1 mg/dL).   Assessment: 73 y.o. male with h/o Afib, PTA Xarelto  on hold (patient reported not taking anymore but fill history shows recent fills). Heparin  consult.  Heparin  level remains therapeutic 0.35 on 1600 units/hr. Hgb low 9.0 but stable. No s/sx of bleeding per RN. Patient will remain on IV heparin  through next week 8/19 for planned procedure.  Goal of Therapy:  Heparin  level 0.3-0.7 units/ml Monitor platelets by anticoagulation protocol: Yes   Plan:  Continue heparin  gtt at 1600 units / hr Monitor daily heparin  level and CBC Monitor H&H, plts, signs and symptoms of bleeding  Koren Or, PharmD Clinical Pharmacist 02/12/2024 10:06 AM Please check AMION for all [facility] Pharmacy numbers\

## 2024-02-12 NOTE — Progress Notes (Signed)
 OT Cancellation Note  Patient Details Name: EMMA SCHUPP MRN: [id number] DOB: 03/14/1951   Cancelled Treatment:    Reason Eval/Treat Not Completed: Patient declined, no reason specified. Pt reporting fatigue, and brain fog. Agreeable for OT return in PM. OT will return as able to.    Tyresse Jayson C, OT  Acute Rehabilitation Services Office [phone] Secure chat preferred    [name] Savers 02/12/2024, 12:04 PM

## 2024-02-12 NOTE — Progress Notes (Signed)
 Occupational Therapy Treatment Patient Details Name: Bradley Ferguson MRN: [id number] DOB: May 21, 1951 Today's Date: 02/12/2024   History of present illness 73 y.o. male admitted 02/03/24 for near syncope at pulmonologist appt. 8/1 CT with increase in size of dense masslike consolidation & pleural effusion. PMH: COPD, ILD, Afib, HTN, PAD s/p bil fem endarerectomy, HLD, raynaud's syndrome, latent tuberculosis, tobacco abuse   OT comments   Pt progressing towards goals. Improved activity tolerance to complete standing grooming tasks and increased distance with functional mobility. Pt continues to be limited by decreased balance, requiring at least single UE support at all times and close CGA. Noted pt with decreased cognition from previous session. Difficulty distinguishing events from earlier in day from previous days, moderate cueing for problem solving, difficulty expressing himself verbally. Will continue to assess cog at future sessions. Continue to recommend <3 hours of skilled rehab daily to optimize independence levels. Will continue to follow acutely.        If plan is discharge home, recommend the following:  A little help with walking and/or transfers;A little help with bathing/dressing/bathroom;Direct supervision/assist for medications management;Assist for transportation   Equipment Recommendations  Other (comment) (Defer to next venue)       Precautions / Restrictions Precautions Precautions: Fall;Other (comment) Recall of Precautions/Restrictions: Impaired Precaution/Restrictions Comments: watch sats Restrictions Weight Bearing Restrictions Per Provider Order: No       Mobility Bed Mobility Overal bed mobility: Needs Assistance Bed Mobility: Supine to Sit     Supine to sit: Supervision     General bed mobility comments: Supervision for safety    Transfers Overall transfer level: Needs assistance Equipment used: Rolling walker (2 wheels) Transfers: Sit to/from  Stand, Bed to chair/wheelchair/BSC Sit to Stand: Contact guard assist     Step pivot transfers: Contact guard assist     General transfer comment: CGA for STS, able to mobilize to bathroom heavy cues for problem solving narrow bathroom     Balance Overall balance assessment: Needs assistance, History of Falls Sitting-balance support: No upper extremity supported, Feet supported Sitting balance-Leahy Scale: Good     Standing balance support: Bilateral upper extremity supported, Reliant on assistive device for balance, During functional activity Standing balance-Leahy Scale: Poor Standing balance comment: Needs at least single UE support during functional tasks       ADL either performed or assessed with clinical judgement   ADL Overall ADL's : Needs assistance/impaired     Grooming: Contact guard assist;Wash/dry face;Wash/dry hands;Standing Grooming Details (indicate cue type and reason): CGA for balance, requires single UE support       Toilet Transfer: Contact guard assist;Ambulation;Regular Toilet;Rolling walker (2 wheels) Toilet Transfer Details (indicate cue type and reason): CGA for balance, cues for navigating smaller bathroom Toileting- Clothing Manipulation and Hygiene: Contact guard assist;Sit to/from stand Toileting - Clothing Manipulation Details (indicate cue type and reason): CGA for balance in standing     Functional mobility during ADLs: Contact guard assist;Rolling walker (2 wheels) General ADL Comments: Improved standing tolerance, difficulty with balance    Extremity/Trunk Assessment Upper Extremity Assessment Upper Extremity Assessment: Generalized weakness   Lower Extremity Assessment Lower Extremity Assessment: Defer to PT evaluation        Vision   Vision Assessment?: No apparent visual deficits         Communication Communication Communication: No apparent difficulties Factors Affecting Communication: Difficulty expressing self    Cognition Arousal: Alert Behavior During Therapy: WFL for tasks assessed/performed Cognition: Cognition impaired     Awareness:  Online awareness impaired     Executive functioning impairment (select all impairments): Problem solving, Organization OT - Cognition Comments: Decreased cog from previous session, confused as to if OT visited the pt earlier today or yesterday, difficulty expressing self verbally, difficulty completing sentences       Following commands: Intact        Cueing   Cueing Techniques: Verbal cues, Gestural cues, Tactile cues        General Comments Pt voided at toilet successfully, unable to measure output    Pertinent Vitals/ Pain       Pain Assessment Pain Assessment: Faces Faces Pain Scale: Hurts even more Pain Location: rib Pain Descriptors / Indicators: Discomfort, Grimacing Pain Intervention(s): Monitored during session   Frequency  Min 2X/week        Progress Toward Goals  OT Goals(current goals can now be found in the care plan section)  Progress towards OT goals: Progressing toward goals  Acute Rehab OT Goals Patient Stated Goal: To rest OT Goal Formulation: With patient Time For Goal Achievement: 02/19/24 Potential to Achieve Goals: Good ADL Goals Pt Will Perform Grooming: with supervision;standing Pt Will Perform Lower Body Dressing: with supervision;sit to/from stand Pt Will Transfer to Toilet: with supervision;ambulating;regular height toilet Pt Will Perform Toileting - Clothing Manipulation and hygiene: with supervision;sit to/from stand  Plan         AM-PAC OT 6 Clicks Daily Activity     Outcome Measure   Help from another person eating meals?: None Help from another person taking care of personal grooming?: A Little Help from another person toileting, which includes using toliet, bedpan, or urinal?: A Little Help from another person bathing (including washing, rinsing, drying)?: A Little Help from another person  to put on and taking off regular upper body clothing?: A Little Help from another person to put on and taking off regular lower body clothing?: A Little 6 Click Score: 19    End of Session Equipment Utilized During Treatment: Gait belt;Rolling walker (2 wheels)  OT Visit Diagnosis: Unsteadiness on feet (R26.81);Other abnormalities of gait and mobility (R26.89);Repeated falls (R29.6);Muscle weakness (generalized) (M62.81);History of falling (Z91.81)   Activity Tolerance Patient tolerated treatment well   Patient Left in chair;with call bell/phone within reach;with chair alarm set   Nurse Communication Mobility status        Time: 8460-8440 OT Time Calculation (min): 20 min  Charges: OT General Charges $OT Visit: 1 Visit OT Treatments $Self Care/Home Management : 8-22 mins  Adrianne BROCKS, OT  Acute Rehabilitation Services Office [phone] Secure chat preferred   [name] Savers 02/12/2024, 4:11 PM

## 2024-02-12 NOTE — Progress Notes (Signed)
 Physical Therapy Treatment Patient Details Name: Bradley Ferguson MRN: [id number] DOB: 1950-10-18 Today's Date: 02/12/2024   History of Present Illness 73 y.o. male admitted 02/03/24 for near syncope at pulmonologist appt. 8/1 CT with increase in size of dense masslike consolidation & pleural effusion. PMH: COPD, ILD, Afib, HTN, PAD s/p bil fem endarerectomy, HLD, raynaud's syndrome, latent tuberculosis, tobacco abuse    PT Comments  The pt reports being more fatigued this date, but was motivated to improve and agreeable to session. The pt displays instability with transfers, thus focused part of the session on improving his technique and thus his ease and stability with transfers. Success noted as he required less physical assistance with subsequent reps. Continued to encourage OOB mobility and use of his IS to improve his pulmonary function. Pt verbalized and demonstrated understanding. Will continue to follow acutely.      If plan is discharge home, recommend the following: A little help with walking and/or transfers;A little help with bathing/dressing/bathroom;Assistance with cooking/housework;Assist for transportation;Help with stairs or ramp for entrance   Can travel by private vehicle     Yes  Equipment Recommendations  Other (comment) (defer to next venue)    Recommendations for Other Services       Precautions / Restrictions Precautions Precautions: Fall;Other (comment) Recall of Precautions/Restrictions: Impaired Precaution/Restrictions Comments: watch sats Restrictions Weight Bearing Restrictions Per Provider Order: No     Mobility  Bed Mobility Overal bed mobility: Needs Assistance Bed Mobility: Supine to Sit, Sit to Supine     Supine to sit: Supervision, HOB elevated Sit to supine: Supervision, HOB elevated   General bed mobility comments: Supervision for safety    Transfers Overall transfer level: Needs assistance Equipment used: Rolling walker (2  wheels) Transfers: Sit to/from Stand Sit to Stand: Min assist           General transfer comment: Performed x3 sit <> stand reps. Pt unstable upon first rep, needing minA for balance. Educated pt prior to subsequent x2 reps to scoot to EOB, bring feet under him, push up from the bed, and rock trunk anteriorly to gain momentum to shift weight anteriorly and stand. Improved technique and independence with each subsequent rep, progressing to very light minA by the third rep    Ambulation/Gait Ambulation/Gait assistance: Min assist Gait Distance (Feet): 40 Feet Assistive device: Rolling walker (2 wheels) Gait Pattern/deviations: Step-through pattern, Decreased step length - right, Decreased step length - left, Decreased stride length, Trunk flexed Gait velocity: reduced Gait velocity interpretation: <1.8 ft/sec, indicate of risk for recurrent falls   General Gait Details: Pt ambulates with a flexed posture and decreased bil step length. Verbal cues provided to stand upright. MinA for balance. Distance limited by increased pt generalized fatigue this date   Stairs             Wheelchair Mobility     Tilt Bed    Modified Rankin (Stroke Patients Only)       Balance Overall balance assessment: Needs assistance, History of Falls Sitting-balance support: No upper extremity supported, Feet supported Sitting balance-Leahy Scale: Good     Standing balance support: Bilateral upper extremity supported, Reliant on assistive device for balance, During functional activity Standing balance-Leahy Scale: Poor Standing balance comment: reliant on RW                            Communication Communication Communication: No apparent difficulties  Cognition Arousal: Alert Behavior During  Therapy: WFL for tasks assessed/performed   PT - Cognitive impairments: No apparent impairments                         Following commands: Intact      Cueing Cueing  Techniques: Verbal cues, Gestural cues, Tactile cues  Exercises Other Exercises Other Exercises: IS x5 reps supine Other Exercises: x3 serial sit <> stand reps with minA and RW    General Comments        Pertinent Vitals/Pain Pain Assessment Pain Assessment: Faces Faces Pain Scale: Hurts little more Pain Location: reported pain but did not specify location Pain Descriptors / Indicators: Discomfort, Grimacing Pain Intervention(s): Limited activity within patient's tolerance, Monitored during session, Repositioned    Home Living                          Prior Function            PT Goals (current goals can now be found in the care plan section) Acute Rehab PT Goals Patient Stated Goal: be able to walk PT Goal Formulation: With patient Time For Goal Achievement: 02/19/24 Potential to Achieve Goals: Good Progress towards PT goals: Progressing toward goals    Frequency    Min 2X/week      PT Plan      Co-evaluation              AM-PAC PT 6 Clicks Mobility   Outcome Measure  Help needed turning from your back to your side while in a flat bed without using bedrails?: A Little Help needed moving from lying on your back to sitting on the side of a flat bed without using bedrails?: A Little Help needed moving to and from a bed to a chair (including a wheelchair)?: A Little Help needed standing up from a chair using your arms (e.g., wheelchair or bedside chair)?: A Little Help needed to walk in hospital room?: A Little Help needed climbing 3-5 steps with a railing? : Total 6 Click Score: 16    End of Session Equipment Utilized During Treatment: Gait belt Activity Tolerance: Patient limited by fatigue Patient left: with call bell/phone within reach;in bed;with bed alarm set Nurse Communication: Mobility status (NT) PT Visit Diagnosis: Other abnormalities of gait and mobility (R26.89);Difficulty in walking, not elsewhere classified (R26.2);Muscle  weakness (generalized) (M62.81);Unsteadiness on feet (R26.81)     Time: 9096-9081 PT Time Calculation (min) (ACUTE ONLY): 15 min  Charges:    $Therapeutic Activity: 8-22 mins PT General Charges $$ ACUTE PT VISIT: 1 Visit                     Theo Ferretti, PT, DPT Acute Rehabilitation Services  Office: [phone]    Theo CHRISTELLA Ferretti 02/12/2024, 11:00 AM

## 2024-02-12 NOTE — Plan of Care (Signed)

## 2024-02-12 NOTE — Progress Notes (Signed)
 PROGRESS NOTE  Bradley Ferguson FMW:[id number] DOB: 01/25/1951 DOA: 02/03/2024 PCP: Sebastian Beverley NOVAK, MD   LOS: 9 days   Brief Narrative / Interim history: 73 y.o. male with medical history significant of patient with history of COPD, interstitial lung disease, patient has latent tuberculosis, atrial fibrillation on Tikosyn  and Xarelto , peripheral arterial disease, essential hypertension, anxiety disorder depression, history of hematuria with lung consolidation was on antibiotics by pulmonary.  Patient went to see pulmonary today due to near syncope and productive cough.  He was found to have significant dyspnea and tachycardia with heart rate in the 120s.  Patient noted to have oxygen saturation 90% on room air.  He was however noted to have hypercalcemia, leukocytosis with left shift and lactic acidosis.   Subjective / 24h Interval events: He is doing well this morning, no significant dyspnea at rest but cannot walk more than a few steps  Assesement and Plan: Principal Problem:   Severe sepsis (HCC) Active Problems:   ANXIETY DEPRESSION   Bipolar I disorder (HCC)   Atrial fibrillation (HCC)   Peripheral arterial disease (HCC)   Chronic obstructive pulmonary disease (HCC)   Interstitial pulmonary disease (HCC)   TB lung, latent   Essential hypertension   Hypercalcemia  Principal problem Acute hypoxic respiratory failure in the setting of left upper lobe consolidation -CT scan on 8/1 showed slightly increase of the left upper lobe dense consolidation, concerning for infection versus malignancy.  He has been having worsening shortness of breath, cough, hemoptysis on presentation.  Pulmonary consulted and following.  Legionella negative. Histoplasma antigen negative. Fungitell beta D glucan negative. Blastomyces antigen negative as well - Given history of latent TB, he underwent AFB, now negative x 3 - Has been placed on Zosyn , continue.  Appreciate pulmonary follow-up  Active  problems Concern for primary lung cancer -given left upper lobe mass, significant hypercalcemia there is a good likelihood of malignancy.  CT abdomen pelvis with contrast did show a left lateral psoas nodule concerning for metastasis - Postthoracentesis on Wednesday, cytology still pending.  If negative may need IR consult of the left lateral psoas nodule versus video bronchoscopy which is already scheduled for next Tuesday  Hypercalcemia of malignancy-over 15 on presentation.  PTH appropriately low, PTH RP pending.  Received fluids, Lasix , calcium  now improved to 9.1.  Monitor periodically  Acute kidney injury-resolved, creatinine back to baseline  Hypomagnesemia-magnesium  now normalized  Pulmonary fibrosis, emphysema-contributing to his dyspnea  Persistent A-fib-continue dofetilide , on heparin   Concern for severe sepsis-sepsis physiology improved.  Likely due to his possible upper lobe consolidation  Bipolar 1 disorder-continue home regimen   Scheduled Meds:  bisacodyl   5 mg Oral Once   budesonide -glycopyrrolate -formoterol   2 puff Inhalation BID   cholecalciferol   10,000 Units Oral QPM   dofetilide   500 mcg Oral BID   gabapentin   300 mg Oral TID   polyethylene glycol  17 g Oral Daily   potassium chloride   40 mEq Oral BID   pyridOXINE   100 mg Oral QPM   sorbitol   30 mL Oral Once   Continuous Infusions:  sodium chloride  75 mL/hr at 02/12/24 1036   heparin  1,600 Units/hr (02/12/24 0630)   piperacillin -tazobactam 12.5 mL/hr at 02/12/24 0630   PRN Meds:.albuterol , bisacodyl , bisacodyl , guaiFENesin -dextromethorphan , HYDROcodone -acetaminophen , ketoconazole , mouth rinse, triamcinolone  cream  Current Outpatient Medications  Medication Instructions   albuterol  (PROVENTIL ) 2.5 mg, Nebulization, Every 6 hours PRN   albuterol  (VENTOLIN  HFA) 108 (90 Base) MCG/ACT inhaler Use  2 inhalations  15 minutes  apart  every 4 hours  to rescue Asthma Attack   atorvastatin  (LIPITOR ) 80 MG tablet  Take 1 tablet Daily for Cholesterol   budesonide -glycopyrrolate -formoterol  (BREZTRI  AEROSPHERE) 160-9-4.8 MCG/ACT AERO inhaler 2 puffs, Inhalation, 2 times daily   Cholecalciferol  10,000 Units, Every evening   dofetilide  (TIKOSYN ) 500 mcg, Oral, 2 times daily   gabapentin  (NEURONTIN ) 300 MG capsule Take 1 capsule 3 x /day for Neuropathy Pain   ketoconazole  (NIZORAL ) 2 % cream 1 Application, Daily PRN   oxyCODONE  (OXY IR/ROXICODONE ) 5 mg, Every 4 hours PRN   pyridOXINE  (VITAMIN B6) 100 mg, Every evening   rivaroxaban  (XARELTO ) 20 mg, Oral, Daily with supper   triamcinolone  cream (KENALOG ) 0.5 % 1 Application, Daily PRN    Diet Orders (From admission, onward)     Start     Ordered   02/03/24 1943  Diet Heart Room service appropriate? Yes; Fluid consistency: Thin  Diet effective now       Question Answer Comment  Room service appropriate? Yes   Fluid consistency: Thin      02/03/24 1944            DVT prophylaxis:    Lab Results  Component Value Date   PLT 301 02/12/2024      Code Status: Full Code  Family Communication: no family at bedside   Status is: Inpatient Remains inpatient appropriate because: Severity of illness   Level of care: Progressive  Consultants:  PCCM  Objective: Vitals:   02/11/24 2323 02/12/24 0530 02/12/24 0723 02/12/24 0800  BP: (!) 109/51 118/61 102/67   Pulse: 85 88 81   Resp: 18 18 18    Temp: 99.1 F (37.3 C) 99.8 F (37.7 C) 98.5 F (36.9 C)   TempSrc: Oral Oral Oral   SpO2: 90% 90% 93% 93%  Weight:  68 kg    Height:        Intake/Output Summary (Last 24 hours) at 02/12/2024 1109 Last data filed at 02/12/2024 0853 Gross per 24 hour  Intake 2240.26 ml  Output 825 ml  Net 1415.26 ml   Wt Readings from Last 3 Encounters:  02/12/24 68 kg  01/21/24 68.9 kg  01/19/24 68 kg    Examination:  Constitutional: NAD Eyes: lids and conjunctivae normal, no scleral icterus ENMT: mmm Neck: normal, supple Respiratory: clear to  auscultation bilaterally, no wheezing, no crackles.  Cardiovascular: Regular rate and rhythm, no murmurs / rubs / gallops.  Abdomen: soft, no distention, no tenderness. Bowel sounds positive.    Data Reviewed: I have independently reviewed following labs and imaging studies   CBC Recent Labs  Lab 02/08/24 0315 02/09/24 0240 02/10/24 0225 02/11/24 0259 02/12/24 0301  WBC 21.6* 18.9* 19.9* 18.4* 22.7*  HGB 9.8* 10.3* 9.4* 8.4* 9.0*  HCT 31.5* 33.1* 29.9* 26.8* 28.0*  PLT 329 311 329 284 301  MCV 81.6 81.7 80.8 81.7 80.2  MCH 25.4* 25.4* 25.4* 25.6* 25.8*  MCHC 31.1 31.1 31.4 31.3 32.1  RDW 18.8* 18.8* 18.6* 18.9* 19.4*    Recent Labs  Lab 02/08/24 0315 02/08/24 1650 02/09/24 0240 02/10/24 0225 02/11/24 0259 02/12/24 0301  NA 140  --  137 136 136 134*  K 3.7  --  3.4* 3.5 4.8 4.3  CL 101  --  94* 92* 105 99  CO2 29  --  33* 33* 26 24  GLUCOSE 136*  --  99 87 81 103*  BUN 18  --  18 21 19 16   CREATININE 1.12  --  1.07 1.30* 1.07 1.10  CALCIUM  12.9*  --  11.4* 10.5* 8.0* 9.1  AST  --   --   --   --  24  --   ALT  --   --   --   --  16  --   ALKPHOS  --   --   --   --  86  --   BILITOT  --   --   --   --  0.4  --   ALBUMIN   --   --   --   --  1.7*  --   MG 2.0  --  1.6* 2.0 1.5* 2.4  CRP  --  14.6*  --   --   --   --     ------------------------------------------------------------------------------------------------------------------ No results for input(s): CHOL, HDL, LDLCALC, TRIG, CHOLHDL, LDLDIRECT in the last 72 hours.  Lab Results  Component Value Date   HGBA1C 5.9 (H) 07/06/2023   ------------------------------------------------------------------------------------------------------------------ No results for input(s): TSH, T4TOTAL, T3FREE, THYROIDAB in the last 72 hours.  Invalid input(s): FREET3  Cardiac Enzymes No results for input(s): CKMB, TROPONINI, MYOGLOBIN in the last 168 hours.  Invalid input(s):  CK ------------------------------------------------------------------------------------------------------------------ No results found for: BNP  CBG: Recent Labs  Lab 02/10/24 1620  GLUCAP 107*    Recent Results (from the past 240 hours)  Culture, blood (Routine X 2) w Reflex to ID Panel     Status: None   Collection Time: 02/03/24  3:49 PM   Specimen: BLOOD  Result Value Ref Range Status   Specimen Description   Final    BLOOD LEFT ANTECUBITAL Performed at [facility], [address] [place], [place] [address]    Special Requests   Final    BOTTLES DRAWN AEROBIC AND ANAEROBIC Blood Culture adequate volume Performed at [facility], [address] [place], [place] [address]    Culture   Final    NO GROWTH 5 DAYS Performed at [facility], [address], [place], [place] [address]    Report Status 02/08/2024 FINAL  Final  Culture, blood (Routine X 2) w Reflex to ID Panel     Status: None   Collection Time: 02/03/24  3:53 PM   Specimen: BLOOD  Result Value Ref Range Status   Specimen Description   Final    BLOOD RIGHT ANTECUBITAL Performed at [facility], [address] [place], [place] [address]    Special Requests   Final    BOTTLES DRAWN AEROBIC AND ANAEROBIC Blood Culture adequate volume Performed at [facility], [address] [place], [place] [address]    Culture   Final    NO GROWTH 5 DAYS Performed at [facility], [address], [place], [place] [address]    Report Status 02/08/2024 FINAL  Final  SARS Coronavirus 2 by RT PCR (hospital order, performed in [facility] hospital lab) *cepheid single result test* Anterior Nasal Swab     Status: None   Collection Time: 02/03/24  3:56 PM   Specimen: Anterior Nasal Swab  Result Value Ref Range Status   SARS Coronavirus 2 by RT PCR NEGATIVE NEGATIVE Final    Comment: (NOTE) SARS-CoV-2 target nucleic acids are NOT  DETECTED.  The SARS-CoV-2 RNA is generally detectable in upper and lower respiratory specimens during the acute phase of infection. The lowest concentration of SARS-CoV-2 viral copies this assay can detect is 250 copies / mL. A negative result does not preclude  SARS-CoV-2 infection and should not be used as the sole basis for treatment or other patient management decisions.  A negative result may occur with improper specimen collection / handling, submission of specimen other than nasopharyngeal swab, presence of viral mutation(s) within the areas targeted by this assay, and inadequate number of viral copies (<250 copies / mL). A negative result must be combined with clinical observations, patient history, and epidemiological information.  Fact Sheet for Patients:   RoadLapTop.co.za  Fact Sheet for Healthcare Providers: http://kim-miller.com/  This test is not yet approved or  cleared by the [place]  FDA and has been authorized for detection and/or diagnosis of SARS-CoV-2 by FDA under an Emergency Use Authorization (EUA).  This EUA will remain in effect (meaning this test can be used) for the duration of the COVID-19 declaration under Section 564(b)(1) of the Act, 21 U.S.C. section 360bbb-3(b)(1), unless the authorization is terminated or revoked sooner.  Performed at [facility], [address], [place], [place] [address]   Acid Fast Smear (AFB)     Status: None   Collection Time: 02/04/24  1:31 PM   Specimen: Sputum  Result Value Ref Range Status   AFB Specimen Processing Concentration  Final   Acid Fast Smear Negative  Final    Comment: (NOTE) Performed At: [facility] [address] [place], [place] [id number] Jennette Shorter MD Ey:[id number]    Source (AFB) EXPECTORATED SPUTUM  Final    Comment: Performed at [facility], [address] [place], [place] [address]  Acid Fast Smear  (AFB)     Status: None   Collection Time: 02/04/24  1:32 PM   Specimen: Sputum  Result Value Ref Range Status   AFB Specimen Processing Concentration  Final   Acid Fast Smear Negative  Final    Comment: (NOTE) Performed At: [facility] [address] [place], [place] [id number] Jennette Shorter MD Ey:[id number]    Source (AFB) EXPECTORATED SPUTUM  Final    Comment: Performed at [facility], [address], [place], [place] [address]  MTB-RIF NAA with AFB Culture, sputum     Status: None (Preliminary result)   Collection Time: 02/04/24  1:39 PM  Result Value Ref Range Status   Myco tuberculosis Complex NOT DETECTED NOT DETECTED Final   Rifampin Not applicable NOT DETECTED Final   AFB Specimen Processing Concentration  Final    Comment: (NOTE) Performed At: [facility] [address] [place], [place] [id number] Jennette Shorter MD Ey:[id number]    Acid Fast Culture PENDING  Incomplete   Source (MTB RIF) EXSPU  Final    Comment: Performed at [facility], [address] [place], [place] [address]  Expectorated Sputum Assessment w Gram Stain, Rflx to Resp Cult     Status: None   Collection Time: 02/04/24  1:40 PM   Specimen: INDUCED SPUTUM  Result Value Ref Range Status   Specimen Description INDUCED SPUTUM  Final   Special Requests NONE  Final   Sputum evaluation   Final    THIS SPECIMEN IS ACCEPTABLE FOR SPUTUM CULTURE Performed at [facility], [address] [place], [place] [address]    Report Status 02/04/2024 FINAL  Final  Culture, Respiratory w Gram Stain     Status: None   Collection Time: 02/04/24  1:40 PM   Specimen: INDUCED SPUTUM  Result Value Ref Range Status   Specimen Description INDUCED SPUTUM  Final   Special Requests NONE Reflexed from  [id number]  Final   Gram Stain   Final    FEW WBC PRESENT, PREDOMINANTLY PMN RARE BUDDING YEAST SEEN    Culture   Final    RARE CANDIDA GLABRATA RARE Normal respiratory flora-no Staph aureus  or Pseudomonas seen Performed at [facility], [address], [place], [place] [address]    Report Status 02/07/2024 FINAL  Final  Acid Fast Smear (AFB)     Status: None   Collection Time: 02/05/24  2:25 PM  Result Value Ref Range Status   AFB Specimen Processing Concentration  Final   Acid Fast Smear Negative  Final    Comment: (NOTE) Performed At: [facility] [address] [place], [place] [id number] Jennette Shorter MD Ey:[id number]    Source (AFB) INDUCED SPUTUM  Final    Comment: Performed at [facility], [address] [place], [place] [address]  Acid Fast Smear (AFB)     Status: None   Collection Time: 02/06/24  9:38 AM   Specimen: Sputum  Result Value Ref Range Status   AFB Specimen Processing Concentration  Final   Acid Fast Smear Negative  Final    Comment: (NOTE) Performed At: [facility] [address] [place], [place] [id number] Jennette Shorter MD Ey:[id number]    Source (AFB) SPU  Final    Comment: Performed at [facility], [address] [place], [place] [address]  MRSA Next Gen by PCR, Nasal     Status: None   Collection Time: 02/08/24  3:42 PM   Specimen: Nasal Mucosa; Nasal Swab  Result Value Ref Range Status   MRSA by PCR Next Gen NOT DETECTED NOT DETECTED Final    Comment: (NOTE) The GeneXpert MRSA Assay (FDA approved for NASAL specimens only), is one component of a comprehensive MRSA colonization surveillance program. It is not intended to diagnose MRSA infection nor to guide or monitor treatment for MRSA infections. Test performance is not FDA approved in patients less than 44 years old. Performed at [facility], [address], [place], [place] [address]   Blastomyces Antigen     Status: None   Collection Time: 02/08/24  4:50 PM   Specimen: Blood  Result Value Ref Range Status   Blastomyces Antigen None Detected None Detected ng/mL Final    Comment: (NOTE) Reference Interval: None  Detected Reportable Range: 0.31 ng/mL - 20.00 ng/mL Results above 20.00 ng/mL are reported as 'Positive, Above the Limit of Quantification' This test was developed and its performance characteristics determined by [employer]. It has not been cleared or approved by the FDA; however, FDA clearance or approval is not currently required for clinical use. The results are not intended to be used as the sole means for clinical diagnosis or patient decisions.    Interpretation Negative  Final   Specimen Type SERUM  Final    Comment: (NOTE) Performed At: [facility] [address] [place], [place] [id number] [place] [facility] MD Ey:[id number]   Body fluid culture w Gram Stain     Status: None (Preliminary result)   Collection Time: 02/10/24  1:32 PM   Specimen: Pleural Fluid  Result Value Ref Range Status   Specimen Description PLEURAL  Final   Special Requests NONE  Final   Gram Stain   Final    WBC PRESENT, PREDOMINANTLY MONONUCLEAR NO ORGANISMS SEEN CYTOSPIN SMEAR    Culture   Final    NO GROWTH < 24 HOURS Performed at [facility], [address] [place], [place] [address]    Report Status PENDING  Incomplete     Radiology Studies: No results found.    Nilda Fendt, MD, PhD [facility]  Between 7 am - 7 pm I am available, please contact me via Amion (for emergencies) or Securechat (non urgent messages)  Between 7 pm - 7 am I am not available, please contact night coverage MD/APP via Amion

## 2024-02-12 NOTE — TOC Progression Note (Signed)
 Transition of Care [facility] - Progression Note    Patient Details  Name: Bradley Bradley MRN: [id number] Date of Birth: Mar 13, 1951  [facility] CM/SW Contact  [name], [place] Phone Number: 02/12/2024, 1:56 PM  Clinical Narrative:   Per daily meeting with treatment team and chart review, patient to potentially have a bronchoscopy on Monday or Tuesday next week.  CSW will continue to follow.    Expected Discharge Plan: Skilled Nursing Facility Barriers to Discharge: Continued Medical Work up, SNF Pending bed offer, Insurance Authorization               Expected Discharge Plan and Services In-house Referral: Clinical Social Work     Living arrangements for the past 2 months: Single Family Home                                       Social Drivers of Health (SDOH) Interventions SDOH Screenings   Food Insecurity: Food Insecurity Present (02/03/2024)  Housing: Low Risk  (02/03/2024)  Transportation Needs: No Transportation Needs (02/03/2024)  Utilities: Not At Risk (02/03/2024)  Alcohol Screen: Low Risk  (12/29/2023)  Depression (PHQ2-9): Medium Risk (11/13/2023)  Financial Resource Strain: Medium Risk (12/29/2023)  Physical Activity: Inactive (12/29/2023)  Social Connections: Socially Isolated (02/03/2024)  Stress: Stress Concern Present (12/29/2023)  Tobacco Use: High Risk (02/03/2024)  Health Literacy: Adequate Health Literacy (11/13/2023)    Readmission Risk Interventions    02/04/2024    3:23 PM 11/10/2023    2:54 PM  Readmission Risk Prevention Plan  Post Dischage Appt  Complete  Medication Screening  Complete  Transportation Screening Complete Complete  PCP or Specialist Appt within 5-7 Days Complete   Home Care Screening Complete   Medication Review (RN CM) Complete

## 2024-02-13 DIAGNOSIS — R6520 Severe sepsis without septic shock: Secondary | ICD-10-CM | POA: Diagnosis not present

## 2024-02-13 DIAGNOSIS — A419 Sepsis, unspecified organism: Secondary | ICD-10-CM | POA: Diagnosis not present

## 2024-02-13 LAB — CBC
HCT: 28.2 % — ABNORMAL LOW (ref 39.0–52.0)
Hemoglobin: 8.8 g/dL — ABNORMAL LOW (ref 13.0–17.0)
MCH: 25.4 pg — ABNORMAL LOW (ref 26.0–34.0)
MCHC: 31.2 g/dL (ref 30.0–36.0)
MCV: 81.5 fL (ref 80.0–100.0)
Platelets: 286 K/uL (ref 150–400)
RBC: 3.46 MIL/uL — ABNORMAL LOW (ref 4.22–5.81)
RDW: 19.9 % — ABNORMAL HIGH (ref 11.5–15.5)
WBC: 22.4 K/uL — ABNORMAL HIGH (ref 4.0–10.5)
nRBC: 0 % (ref 0.0–0.2)

## 2024-02-13 LAB — BASIC METABOLIC PANEL WITH GFR
Anion gap: 8 (ref 5–15)
BUN: 11 mg/dL (ref 8–23)
CO2: 21 mmol/L — ABNORMAL LOW (ref 22–32)
Calcium: 8.7 mg/dL — ABNORMAL LOW (ref 8.9–10.3)
Chloride: 105 mmol/L (ref 98–111)
Creatinine, Ser: 0.92 mg/dL (ref 0.61–1.24)
GFR, Estimated: >60 mL/min (ref >60)
Glucose, Bld: 92 mg/dL (ref 70–99)
Potassium: 4.8 mmol/L (ref 3.5–5.1)
Sodium: 134 mmol/L — ABNORMAL LOW (ref 135–145)

## 2024-02-13 LAB — BODY FLUID CULTURE W GRAM STAIN: Culture: NO GROWTH

## 2024-02-13 LAB — CHOLESTEROL, BODY FLUID: Cholesterol, Fluid: 54 mg/dL

## 2024-02-13 LAB — HEPARIN LEVEL (UNFRACTIONATED): Heparin Unfractionated: 0.38 [IU]/mL (ref 0.30–0.70)

## 2024-02-13 NOTE — Progress Notes (Signed)
 PROGRESS NOTE  ACETON KINNEAR FMW:[id number] DOB: September 26, 1950 DOA: 02/03/2024 PCP: Sebastian Beverley NOVAK, MD   LOS: 10 days   Brief Narrative / Interim history: 73 y.o. male with medical history significant of patient with history of COPD, interstitial lung disease, patient has latent tuberculosis, atrial fibrillation on Tikosyn  and Xarelto , peripheral arterial disease, essential hypertension, anxiety disorder depression, history of hematuria with lung consolidation was on antibiotics by pulmonary.  Patient went to see pulmonary today due to near syncope and productive cough.  He was found to have significant dyspnea and tachycardia with heart rate in the 120s.  Patient noted to have oxygen saturation 90% on room air.  He was however noted to have hypercalcemia, leukocytosis with left shift and lactic acidosis.   Subjective / 24h Interval events: He is overall feeling well at rest.  He denies any chest pain, no nausea or vomiting  Assesement and Plan: Principal Problem:   Severe sepsis (HCC) Active Problems:   ANXIETY DEPRESSION   Bipolar I disorder (HCC)   Atrial fibrillation (HCC)   Peripheral arterial disease (HCC)   Chronic obstructive pulmonary disease (HCC)   Interstitial pulmonary disease (HCC)   TB lung, latent   Essential hypertension   Hypercalcemia  Principal problem Acute hypoxic respiratory failure in the setting of left upper lobe consolidation -CT scan on 8/1 showed slightly increase of the left upper lobe dense consolidation, concerning for infection versus malignancy.  He has been having worsening shortness of breath, cough, hemoptysis on presentation.  Pulmonary consulted and following.  Legionella negative. Histoplasma antigen negative. Fungitell beta D glucan negative. Blastomyces antigen negative as well - Given history of latent TB, he underwent AFB, now negative x 3 - Has been placed on Zosyn , continue.  Appreciate pulmonary follow-up - Unfortunately fluid cytology  still pending  Active problems Concern for primary lung cancer -given left upper lobe mass, significant hypercalcemia there is a good likelihood of malignancy.  CT abdomen pelvis with contrast did show a left lateral psoas nodule concerning for metastasis - Postthoracentesis on Wednesday, cytology fortunately still pending today.  If negative may need IR consult of the left lateral psoas nodule versus video bronchoscopy which is already scheduled for next Tuesday  Hypercalcemia of malignancy-over 15 on presentation.  PTH appropriately low, PTH RP pending.  Received fluids, Lasix , calcium  now is better.  8.7 today.  Continue to monitor  Acute kidney injury-resolved, creatinine back to baseline  Hypomagnesemia-magnesium  now normalized  Pulmonary fibrosis, emphysema-contributing to his dyspnea  Persistent A-fib-continue dofetilide , on heparin   Concern for severe sepsis-sepsis physiology improved.  Likely due to his possible upper lobe consolidation.  Continue antibiotics  Bipolar 1 disorder-continue home regimen   Scheduled Meds:  bisacodyl   5 mg Oral Once   budesonide -glycopyrrolate -formoterol   2 puff Inhalation BID   cholecalciferol   10,000 Units Oral QPM   dofetilide   500 mcg Oral BID   gabapentin   300 mg Oral TID   polyethylene glycol  17 g Oral Daily   potassium chloride   40 mEq Oral BID   pyridOXINE   100 mg Oral QPM   sorbitol   30 mL Oral Once   Continuous Infusions:  sodium chloride  75 mL/hr at 02/12/24 2220   heparin  1,600 Units/hr (02/13/24 0857)   piperacillin -tazobactam 3.375 g (02/13/24 0501)   PRN Meds:.albuterol , bisacodyl , bisacodyl , guaiFENesin -dextromethorphan , HYDROcodone -acetaminophen , ketoconazole , mouth rinse, triamcinolone  cream  Current Outpatient Medications  Medication Instructions   albuterol  (PROVENTIL ) 2.5 mg, Nebulization, Every 6 hours PRN   albuterol  (VENTOLIN  HFA) 108 (  90 Base) MCG/ACT inhaler Use  2 inhalations  15 minutes apart  every 4 hours   to rescue Asthma Attack   atorvastatin  (LIPITOR ) 80 MG tablet Take 1 tablet Daily for Cholesterol   budesonide -glycopyrrolate -formoterol  (BREZTRI  AEROSPHERE) 160-9-4.8 MCG/ACT AERO inhaler 2 puffs, Inhalation, 2 times daily   Cholecalciferol  10,000 Units, Every evening   dofetilide  (TIKOSYN ) 500 mcg, Oral, 2 times daily   gabapentin  (NEURONTIN ) 300 MG capsule Take 1 capsule 3 x /day for Neuropathy Pain   ketoconazole  (NIZORAL ) 2 % cream 1 Application, Daily PRN   oxyCODONE  (OXY IR/ROXICODONE ) 5 mg, Every 4 hours PRN   pyridOXINE  (VITAMIN B6) 100 mg, Every evening   rivaroxaban  (XARELTO ) 20 mg, Oral, Daily with supper   triamcinolone  cream (KENALOG ) 0.5 % 1 Application, Daily PRN    Diet Orders (From admission, onward)     Start     Ordered   02/03/24 1943  Diet Heart Room service appropriate? Yes; Fluid consistency: Thin  Diet effective now       Question Answer Comment  Room service appropriate? Yes   Fluid consistency: Thin      02/03/24 1944            DVT prophylaxis:    Lab Results  Component Value Date   PLT 286 02/13/2024      Code Status: Full Code  Family Communication: no family at bedside   Status is: Inpatient Remains inpatient appropriate because: Severity of illness   Level of care: Progressive  Consultants:  PCCM  Objective: Vitals:   02/12/24 2010 02/12/24 2354 02/13/24 0323 02/13/24 0737  BP: 115/75 112/73 114/71   Pulse: 79 75 81 79  Resp: 16 19 19    Temp: 98.1 F (36.7 C) (!) 97.5 F (36.4 C) 98.6 F (37 C) (!) 97.5 F (36.4 C)  TempSrc: Oral Oral Oral   SpO2: 94% 94% 93%   Weight:      Height:        Intake/Output Summary (Last 24 hours) at 02/13/2024 1118 Last data filed at 02/13/2024 0915 Gross per 24 hour  Intake 1587.22 ml  Output 1775 ml  Net -187.78 ml   Wt Readings from Last 3 Encounters:  02/12/24 68 kg  01/21/24 68.9 kg  01/19/24 68 kg    Examination:  Constitutional: NAD Eyes: lids and conjunctivae normal,  no scleral icterus ENMT: mmm Neck: normal, supple Respiratory: clear to auscultation bilaterally, no wheezing, no crackles. Cardiovascular: Regular rate and rhythm, no murmurs / rubs / gallops. Abdomen: soft, no distention, no tenderness. Bowel sounds positive.    Data Reviewed: I have independently reviewed following labs and imaging studies   CBC Recent Labs  Lab 02/09/24 0240 02/10/24 0225 02/11/24 0259 02/12/24 0301 02/13/24 0255  WBC 18.9* 19.9* 18.4* 22.7* 22.4*  HGB 10.3* 9.4* 8.4* 9.0* 8.8*  HCT 33.1* 29.9* 26.8* 28.0* 28.2*  PLT 311 329 284 301 286  MCV 81.7 80.8 81.7 80.2 81.5  MCH 25.4* 25.4* 25.6* 25.8* 25.4*  MCHC 31.1 31.4 31.3 32.1 31.2  RDW 18.8* 18.6* 18.9* 19.4* 19.9*    Recent Labs  Lab 02/08/24 0315 02/08/24 1650 02/09/24 0240 02/10/24 0225 02/11/24 0259 02/12/24 0301 02/13/24 0255  NA 140  --  137 136 136 134* 134*  K 3.7  --  3.4* 3.5 4.8 4.3 4.8  CL 101  --  94* 92* 105 99 105  CO2 29  --  33* 33* 26 24 21*  GLUCOSE 136*  --  99 87  81 103* 92  BUN 18  --  18 21 19 16 11   CREATININE 1.12  --  1.07 1.30* 1.07 1.10 0.92  CALCIUM  12.9*  --  11.4* 10.5* 8.0* 9.1 8.7*  AST  --   --   --   --  24  --   --   ALT  --   --   --   --  16  --   --   ALKPHOS  --   --   --   --  86  --   --   BILITOT  --   --   --   --  0.4  --   --   ALBUMIN   --   --   --   --  1.7*  --   --   MG 2.0  --  1.6* 2.0 1.5* 2.4  --   CRP  --  14.6*  --   --   --   --   --     ------------------------------------------------------------------------------------------------------------------ No results for input(s): CHOL, HDL, LDLCALC, TRIG, CHOLHDL, LDLDIRECT in the last 72 hours.  Lab Results  Component Value Date   HGBA1C 5.9 (H) 07/06/2023   ------------------------------------------------------------------------------------------------------------------ No results for input(s): TSH, T4TOTAL, T3FREE, THYROIDAB in the last 72 hours.  Invalid  input(s): FREET3  Cardiac Enzymes No results for input(s): CKMB, TROPONINI, MYOGLOBIN in the last 168 hours.  Invalid input(s): CK ------------------------------------------------------------------------------------------------------------------ No results found for: BNP  CBG: Recent Labs  Lab 02/10/24 1620  GLUCAP 107*    Recent Results (from the past 240 hours)  Culture, blood (Routine X 2) w Reflex to ID Panel     Status: None   Collection Time: 02/03/24  3:49 PM   Specimen: BLOOD  Result Value Ref Range Status   Specimen Description   Final    BLOOD LEFT ANTECUBITAL Performed at [facility], [address], [place], [place] [address]    Special Requests   Final    BOTTLES DRAWN AEROBIC AND ANAEROBIC Blood Culture adequate volume Performed at [facility], [address] [place], [place] [address]    Culture   Final    NO GROWTH 5 DAYS Performed at [facility], [address] [place], [place] [address]    Report Status 02/08/2024 FINAL  Final  Culture, blood (Routine X 2) w Reflex to ID Panel     Status: None   Collection Time: 02/03/24  3:53 PM   Specimen: BLOOD  Result Value Ref Range Status   Specimen Description   Final    BLOOD RIGHT ANTECUBITAL Performed at [facility], [address], [place], [place] [address]    Special Requests   Final    BOTTLES DRAWN AEROBIC AND ANAEROBIC Blood Culture adequate volume Performed at [facility], [address] [place], [place] [address]    Culture   Final    NO GROWTH 5 DAYS Performed at [facility], [address] [place], [place] [address]    Report Status 02/08/2024 FINAL  Final  SARS Coronavirus 2 by RT PCR (hospital order, performed in [facility] hospital lab) *cepheid single result test* Anterior Nasal Swab     Status: None   Collection Time: 02/03/24  3:56 PM   Specimen: Anterior Nasal Swab  Result  Value Ref Range Status   SARS Coronavirus 2 by RT PCR NEGATIVE NEGATIVE Final    Comment: (NOTE) SARS-CoV-2 target nucleic acids are  NOT DETECTED.  The SARS-CoV-2 RNA is generally detectable in upper and lower respiratory specimens during the acute phase of infection. The lowest concentration of SARS-CoV-2 viral copies this assay can detect is 250 copies / mL. A negative result does not preclude SARS-CoV-2 infection and should not be used as the sole basis for treatment or other patient management decisions.  A negative result may occur with improper specimen collection / handling, submission of specimen other than nasopharyngeal swab, presence of viral mutation(s) within the areas targeted by this assay, and inadequate number of viral copies (<250 copies / mL). A negative result must be combined with clinical observations, patient history, and epidemiological information.  Fact Sheet for Patients:   RoadLapTop.co.za  Fact Sheet for Healthcare Providers: http://kim-miller.com/  This test is not yet approved or  cleared by the [place]  FDA and has been authorized for detection and/or diagnosis of SARS-CoV-2 by FDA under an Emergency Use Authorization (EUA).  This EUA will remain in effect (meaning this test can be used) for the duration of the COVID-19 declaration under Section 564(b)(1) of the Act, 21 U.S.C. section 360bbb-3(b)(1), unless the authorization is terminated or revoked sooner.  Performed at [facility], [address], [place], [place] [address]   Acid Fast Smear (AFB)     Status: None   Collection Time: 02/04/24  1:31 PM   Specimen: Sputum  Result Value Ref Range Status   AFB Specimen Processing Concentration  Final   Acid Fast Smear Negative  Final    Comment: (NOTE) Performed At: [facility] [address] [place], [place] [id number] Jennette Shorter MD Ey:[id number]    Source  (AFB) EXPECTORATED SPUTUM  Final    Comment: Performed at [facility], [address] [place], [place] [address]  Acid Fast Smear (AFB)     Status: None   Collection Time: 02/04/24  1:32 PM   Specimen: Sputum  Result Value Ref Range Status   AFB Specimen Processing Concentration  Final   Acid Fast Smear Negative  Final    Comment: (NOTE) Performed At: [facility] [address] [place], [place] [id number] Jennette Shorter MD Ey:[id number]    Source (AFB) EXPECTORATED SPUTUM  Final    Comment: Performed at [facility], [address], [place], [place] [address]  MTB-RIF NAA with AFB Culture, sputum     Status: None (Preliminary result)   Collection Time: 02/04/24  1:39 PM  Result Value Ref Range Status   Myco tuberculosis Complex NOT DETECTED NOT DETECTED Final   Rifampin Not applicable NOT DETECTED Final   AFB Specimen Processing Concentration  Final    Comment: (NOTE) Performed At: [facility] [address] [place], [place] [id number] Jennette Shorter MD Ey:[id number]    Acid Fast Culture PENDING  Incomplete   Source (MTB RIF) EXSPU  Final    Comment: Performed at [facility], [address], [place], [place] [address]  Expectorated Sputum Assessment w Gram Stain, Rflx to Resp Cult     Status: None   Collection Time: 02/04/24  1:40 PM   Specimen: INDUCED SPUTUM  Result Value Ref Range Status   Specimen Description INDUCED SPUTUM  Final   Special Requests NONE  Final   Sputum evaluation   Final    THIS SPECIMEN IS ACCEPTABLE FOR SPUTUM CULTURE Performed at [facility], [address] [place], [place] [address]    Report Status 02/04/2024 FINAL  Final  Culture, Respiratory  w Gram Stain     Status: None   Collection Time: 02/04/24  1:40 PM   Specimen: INDUCED SPUTUM  Result Value Ref Range Status   Specimen Description INDUCED SPUTUM  Final   Special Requests NONE Reflexed from [id number]  Final   Gram Stain   Final    FEW WBC  PRESENT, PREDOMINANTLY PMN RARE BUDDING YEAST SEEN    Culture   Final    RARE CANDIDA GLABRATA RARE Normal respiratory flora-no Staph aureus or Pseudomonas seen Performed at [facility], [address], [place], [place] [address]    Report Status 02/07/2024 FINAL  Final  Acid Fast Smear (AFB)     Status: None   Collection Time: 02/05/24  2:25 PM  Result Value Ref Range Status   AFB Specimen Processing Concentration  Final   Acid Fast Smear Negative  Final    Comment: (NOTE) Performed At: [facility] [address] [place], [place] [id number] Jennette Shorter MD Ey:[id number]    Source (AFB) INDUCED SPUTUM  Final    Comment: Performed at [facility], [address], [place], [place] [address]  Acid Fast Smear (AFB)     Status: None   Collection Time: 02/06/24  9:38 AM   Specimen: Sputum  Result Value Ref Range Status   AFB Specimen Processing Concentration  Final   Acid Fast Smear Negative  Final    Comment: (NOTE) Performed At: [facility] [address] [place], [place] [id number] Jennette Shorter MD Ey:[id number]    Source (AFB) SPU  Final    Comment: Performed at [facility], [address], [place], [place] [address]  MRSA Next Gen by PCR, Nasal     Status: None   Collection Time: 02/08/24  3:42 PM   Specimen: Nasal Mucosa; Nasal Swab  Result Value Ref Range Status   MRSA by PCR Next Gen NOT DETECTED NOT DETECTED Final    Comment: (NOTE) The GeneXpert MRSA Assay (FDA approved for NASAL specimens only), is one component of a comprehensive MRSA colonization surveillance program. It is not intended to diagnose MRSA infection nor to guide or monitor treatment for MRSA infections. Test performance is not FDA approved in patients less than 50 years old. Performed at [facility], [address] [place], [place] [address]   Blastomyces Antigen     Status: None   Collection Time: 02/08/24  4:50 PM   Specimen: Blood  Result Value  Ref Range Status   Blastomyces Antigen None Detected None Detected ng/mL Final    Comment: (NOTE) Reference Interval: None Detected Reportable Range: 0.31 ng/mL - 20.00 ng/mL Results above 20.00 ng/mL are reported as 'Positive, Above the Limit of Quantification' This test was developed and its performance characteristics determined by [employer]. It has not been cleared or approved by the FDA; however, FDA clearance or approval is not currently required for clinical use. The results are not intended to be used as the sole means for clinical diagnosis or patient decisions.    Interpretation Negative  Final   Specimen Type SERUM  Final    Comment: (NOTE) Performed At: [facility] [address] [place], [place] [id number] [place] Pac MD Ey:[id number]   Body fluid culture w Gram Stain     Status: None   Collection Time: 02/10/24  1:32 PM   Specimen: Pleural Fluid  Result Value Ref Range Status   Specimen Description PLEURAL  Final   Special Requests  NONE  Final   Gram Stain   Final    WBC PRESENT, PREDOMINANTLY MONONUCLEAR NO ORGANISMS SEEN CYTOSPIN SMEAR    Culture   Final    NO GROWTH 3 DAYS Performed at [facility], [address], [place], [place] [address]    Report Status 02/13/2024 FINAL  Final     Radiology Studies: No results found.    Nilda Fendt, MD, PhD [facility]  Between 7 am - 7 pm I am available, please contact me via Amion (for emergencies) or Securechat (non urgent messages)  Between 7 pm - 7 am I am not available, please contact night coverage MD/APP via Amion

## 2024-02-13 NOTE — Plan of Care (Signed)
   Problem: Clinical Measurements: Goal: Ability to maintain clinical measurements within normal limits will improve Outcome: Progressing Goal: Cardiovascular complication will be avoided Outcome: Progressing   Problem: Activity: Goal: Risk for activity intolerance will decrease Outcome: Progressing

## 2024-02-13 NOTE — Progress Notes (Signed)
 Mobility Specialist Progress Note:    02/13/24 1046  Mobility  Activity Ambulated with assistance  Level of Assistance Standby assist, set-up cues, supervision of patient - no hands on  Assistive Device Front wheel walker  Distance Ambulated (ft) 150 ft  Activity Response Tolerated well  Mobility Referral Yes  Mobility visit 1 Mobility  Mobility Specialist Start Time (ACUTE ONLY) 1046  Mobility Specialist Stop Time (ACUTE ONLY) 1105  Mobility Specialist Time Calculation (min) (ACUTE ONLY) 19 min   Received pt from recliner. Pt agreeable to session. No c/o any symptoms. Pt pays good attention to energy levels and how he feels while ambulating. Returned pt to bed w/ all needs met.   Venetia Keel Mobility Specialist Please Neurosurgeon or Rehab Office at [phone]

## 2024-02-13 NOTE — Progress Notes (Signed)
 PHARMACY - ANTICOAGULATION  Pharmacy Consult for heparin  Indication: atrial fibrillation   Allergies  Allergen Reactions   Cymbalta  [Duloxetine  Hcl]     unknown   Effexor [Venlafaxine]     Feels bad   Wellbutrin  [Bupropion ]     sleepy   Zoloft [Sertraline Hcl]     Felt bad    Patient Measurements: Height: 6' 0.99 (185.4 cm) Weight: 68 kg (149 lb 14.6 oz) IBW/kg (Calculated) : 79.88 HEPARIN  DW (KG): 65.2  Vital Signs: Temp: 98.6 F (37 C) (08/16 0323) Temp Source: Oral (08/16 0323) BP: 114/71 (08/16 0323) Pulse Rate: 81 (08/16 0323)  Labs: Recent Labs    02/11/24 0259 02/11/24 1029 02/12/24 0301 02/13/24 0255  HGB 8.4*  --  9.0* 8.8*  HCT 26.8*  --  28.0* 28.2*  PLT 284  --  301 286  HEPARINUNFRC >1.10* 0.43 0.35 0.38  CREATININE 1.07  --  1.10 0.92    Estimated Creatinine Clearance: 69.8 mL/min (by C-G formula based on SCr of 0.92 mg/dL).   Assessment: 73 y.o. male with h/o Afib, PTA Xarelto  on hold (patient reported not taking anymore but fill history shows recent fills). Heparin  consult.  Heparin  level remains therapeutic 0.38 on 1600 units/hr. Hgb low 8.8 but stable. No s/sx of bleeding per RN. Patient will remain on IV heparin  through next week 8/19 for planned procedure.  Goal of Therapy:  Heparin  level 0.3-0.7 units/ml Monitor platelets by anticoagulation protocol: Yes   Plan:  Continue heparin  gtt at 1600 units / hr Monitor daily heparin  level and CBC Monitor H&H, plts, signs and symptoms of bleeding  Thank you for allowing pharmacy to be involved with this patient's care.  Mendel Barter, PharmD PGY1 Clinical Pharmacist [facility]  02/13/2024 7:26 AM

## 2024-02-14 DIAGNOSIS — R6520 Severe sepsis without septic shock: Secondary | ICD-10-CM | POA: Diagnosis not present

## 2024-02-14 DIAGNOSIS — A419 Sepsis, unspecified organism: Secondary | ICD-10-CM | POA: Diagnosis not present

## 2024-02-14 LAB — CBC
HCT: 27.1 % — ABNORMAL LOW (ref 39.0–52.0)
Hemoglobin: 8.6 g/dL — ABNORMAL LOW (ref 13.0–17.0)
MCH: 25.6 pg — ABNORMAL LOW (ref 26.0–34.0)
MCHC: 31.7 g/dL (ref 30.0–36.0)
MCV: 80.7 fL (ref 80.0–100.0)
Platelets: 300 K/uL (ref 150–400)
RBC: 3.36 MIL/uL — ABNORMAL LOW (ref 4.22–5.81)
RDW: 19.9 % — ABNORMAL HIGH (ref 11.5–15.5)
WBC: 21.3 K/uL — ABNORMAL HIGH (ref 4.0–10.5)
nRBC: 0 % (ref 0.0–0.2)

## 2024-02-14 LAB — COMPREHENSIVE METABOLIC PANEL WITH GFR
ALT: 23 U/L (ref 0–44)
AST: 35 U/L (ref 15–41)
Albumin: 1.8 g/dL — ABNORMAL LOW (ref 3.5–5.0)
Alkaline Phosphatase: 112 U/L (ref 38–126)
Anion gap: 8 (ref 5–15)
BUN: 6 mg/dL — ABNORMAL LOW (ref 8–23)
CO2: 20 mmol/L — ABNORMAL LOW (ref 22–32)
Calcium: 8.8 mg/dL — ABNORMAL LOW (ref 8.9–10.3)
Chloride: 106 mmol/L (ref 98–111)
Creatinine, Ser: 0.87 mg/dL (ref 0.61–1.24)
GFR, Estimated: >60 mL/min (ref >60)
Glucose, Bld: 110 mg/dL — ABNORMAL HIGH (ref 70–99)
Potassium: 4.4 mmol/L (ref 3.5–5.1)
Sodium: 134 mmol/L — ABNORMAL LOW (ref 135–145)
Total Bilirubin: 0.5 mg/dL (ref 0.0–1.2)
Total Protein: 5.5 g/dL — ABNORMAL LOW (ref 6.5–8.1)

## 2024-02-14 LAB — HEPARIN LEVEL (UNFRACTIONATED)
Heparin Unfractionated: 0.18 [IU]/mL — ABNORMAL LOW (ref 0.30–0.70)
Heparin Unfractionated: 0.35 [IU]/mL (ref 0.30–0.70)
Heparin Unfractionated: 0.41 [IU]/mL (ref 0.30–0.70)

## 2024-02-14 LAB — MAGNESIUM: Magnesium: 1.5 mg/dL — ABNORMAL LOW (ref 1.7–2.4)

## 2024-02-14 MED ORDER — TRANEXAMIC ACID FOR INHALATION: 500.0000 mg | RESPIRATORY_TRACT | Status: DC | PRN

## 2024-02-14 MED ORDER — MAGNESIUM SULFATE 4 GM/100ML IV SOLN
4.0000 g | Freq: Once | INTRAVENOUS | Status: AC
  Administered 2024-02-14: 4 g via INTRAVENOUS
  Filled 2024-02-14: qty 100

## 2024-02-14 NOTE — Progress Notes (Signed)
 PHARMACY - ANTICOAGULATION  Pharmacy Consult for heparin  Indication: atrial fibrillation   Allergies  Allergen Reactions   Cymbalta  [Duloxetine  Hcl]     unknown   Effexor [Venlafaxine]     Feels bad   Wellbutrin  [Bupropion ]     sleepy   Zoloft [Sertraline Hcl]     Felt bad    Patient Measurements: Height: 6' 0.99 (185.4 cm) Weight: 69.8 kg (153 lb 14.1 oz) IBW/kg (Calculated) : 79.88 HEPARIN  DW (KG): 69.8  Vital Signs: Temp: 98.5 F (36.9 C) (08/17 1658) Temp Source: Oral (08/17 1658) BP: 121/74 (08/17 1658) Pulse Rate: 96 (08/17 1658)  Labs: Recent Labs    02/12/24 0301 02/13/24 0255 02/14/24 0253 02/14/24 1738 02/14/24 2236  HGB 9.0* 8.8* 8.6*  --   --   HCT 28.0* 28.2* 27.1*  --   --   PLT 301 286 300  --   --   HEPARINUNFRC 0.35 0.38 0.18* 0.35 0.41  CREATININE 1.10 0.92 0.87  --   --     Estimated Creatinine Clearance: 75.8 mL/min (by C-G formula based on SCr of 0.87 mg/dL).   Assessment: 73 y.o. male with h/o Afib, PTA Xarelto  on hold (patient reported not taking anymore but fill history shows recent fills). Pharmacy has been consulted to dose IV heparin .  Heparin  level 0.18 is subtherapeutic with heparin  running at 1600 units/hr. Previously therapeutic at this rate. Hgb (8.6) and PLTs (300) are stable. Patient renal function is stable. Per RN, no report of pauses, issues with the line, or signs of bleeding.  Patient will remain on IV heparin  through next week 8/19 for planned procedure.  PM: HL is therapeutic at 0.35 on 1800 units/hr. No issues with the infusion or bleeding reported per RN  PM update - confirmatory HL therapeutic.  No issues per RN.  Goal of Therapy:  Heparin  level 0.3-0.7 units/ml Monitor platelets by anticoagulation protocol: Yes   Plan:  Continue heparin  at 1800 units/hr Monitor daily heparin  levels and CBC Monitor for any signs/symptoms of bleeding  Thank you for allowing pharmacy to be involved with this patient's  care.  Maurilio Fila, PharmD Clinical Pharmacist 02/14/2024  11:35 PM

## 2024-02-14 NOTE — Progress Notes (Signed)
 PROGRESS NOTE  Bradley Ferguson FMW:[id number] DOB: 04-11-1951 DOA: 02/03/2024 PCP: Sebastian Beverley NOVAK, MD   LOS: 11 days   Brief Narrative / Interim history: 73 y.o. male with medical history significant of patient with history of COPD, interstitial lung disease, patient has latent tuberculosis, atrial fibrillation on Tikosyn  and Xarelto , peripheral arterial disease, essential hypertension, anxiety disorder depression, history of hematuria with lung consolidation was on antibiotics by pulmonary.  Patient went to see pulmonary today due to near syncope and productive cough.  He was found to have significant dyspnea and tachycardia with heart rate in the 120s.  Patient noted to have oxygen saturation 90% on room air.  He was however noted to have hypercalcemia, leukocytosis with left shift and lactic acidosis.   Subjective / 24h Interval events: Increasing dyspnea this morning  Assesement and Plan: Principal Problem:   Severe sepsis (HCC) Active Problems:   ANXIETY DEPRESSION   Bipolar I disorder (HCC)   Atrial fibrillation (HCC)   Peripheral arterial disease (HCC)   Chronic obstructive pulmonary disease (HCC)   Interstitial pulmonary disease (HCC)   TB lung, latent   Essential hypertension   Hypercalcemia  Principal problem Acute hypoxic respiratory failure in the setting of left upper lobe consolidation -CT scan on 8/1 showed slightly increase of the left upper lobe dense consolidation, concerning for infection versus malignancy.  He has been having worsening shortness of breath, cough, hemoptysis on presentation.  Pulmonary consulted and following.  Legionella negative. Histoplasma antigen negative. Fungitell beta D glucan negative. Blastomyces antigen negative as well - Given history of latent TB, he underwent AFB, now negative x 3 - Has been placed on Zosyn , continue.  Appreciate pulmonary follow-up, - Unfortunately fluid cytology still pending this weekend  Active problems Concern  for primary lung cancer -given left upper lobe mass, significant hypercalcemia there is a good likelihood of malignancy.  CT abdomen pelvis with contrast did show a left lateral psoas nodule concerning for metastasis - Postthoracentesis on Wednesday, cytology fortunately still pending this weekend.  If negative may need IR consult of the left lateral psoas nodule versus video bronchoscopy which is already scheduled for next Tuesday  Hypercalcemia of malignancy-over 15 on presentation.  PTH appropriately low, PTH RP pending.  Received fluids, Lasix , calcium  now is better.  8.8 today.  Continue to monitor  Acute kidney injury-resolved, creatinine back to baseline  Hypomagnesemia-replenish magnesium   Pulmonary fibrosis, emphysema-contributing to his dyspnea  Persistent A-fib-continue dofetilide , on heparin   Concern for severe sepsis-sepsis physiology improved.  Likely due to his possible upper lobe consolidation.  Continue antibiotics  Bipolar 1 disorder-continue home regimen   Scheduled Meds:  bisacodyl   5 mg Oral Once   budesonide -glycopyrrolate -formoterol   2 puff Inhalation BID   cholecalciferol   10,000 Units Oral QPM   dofetilide   500 mcg Oral BID   gabapentin   300 mg Oral TID   polyethylene glycol  17 g Oral Daily   potassium chloride   40 mEq Oral BID   pyridOXINE   100 mg Oral QPM   sorbitol   30 mL Oral Once   Continuous Infusions:  sodium chloride  75 mL/hr at 02/14/24 1000   heparin  1,800 Units/hr (02/14/24 1000)   piperacillin -tazobactam 12.5 mL/hr at 02/14/24 1000   PRN Meds:.albuterol , bisacodyl , bisacodyl , guaiFENesin -dextromethorphan , HYDROcodone -acetaminophen , ketoconazole , mouth rinse, triamcinolone  cream  Current Outpatient Medications  Medication Instructions   albuterol  (PROVENTIL ) 2.5 mg, Nebulization, Every 6 hours PRN   albuterol  (VENTOLIN  HFA) 108 (90 Base) MCG/ACT inhaler Use  2 inhalations  15  minutes apart  every 4 hours  to rescue Asthma Attack    atorvastatin  (LIPITOR ) 80 MG tablet Take 1 tablet Daily for Cholesterol   budesonide -glycopyrrolate -formoterol  (BREZTRI  AEROSPHERE) 160-9-4.8 MCG/ACT AERO inhaler 2 puffs, Inhalation, 2 times daily   Cholecalciferol  10,000 Units, Every evening   dofetilide  (TIKOSYN ) 500 mcg, Oral, 2 times daily   gabapentin  (NEURONTIN ) 300 MG capsule Take 1 capsule 3 x /day for Neuropathy Pain   ketoconazole  (NIZORAL ) 2 % cream 1 Application, Daily PRN   oxyCODONE  (OXY IR/ROXICODONE ) 5 mg, Every 4 hours PRN   pyridOXINE  (VITAMIN B6) 100 mg, Every evening   rivaroxaban  (XARELTO ) 20 mg, Oral, Daily with supper   triamcinolone  cream (KENALOG ) 0.5 % 1 Application, Daily PRN    Diet Orders (From admission, onward)     Start     Ordered   02/03/24 1943  Diet Heart Room service appropriate? Yes; Fluid consistency: Thin  Diet effective now       Question Answer Comment  Room service appropriate? Yes   Fluid consistency: Thin      02/03/24 1944            DVT prophylaxis:    Lab Results  Component Value Date   PLT 300 02/14/2024      Code Status: Full Code  Family Communication: no family at bedside   Status is: Inpatient Remains inpatient appropriate because: Severity of illness   Level of care: Progressive  Consultants:  PCCM  Objective: Vitals:   02/14/24 0503 02/14/24 0749 02/14/24 0840 02/14/24 1103  BP: 110/81 131/63  113/88  Pulse: 90 (!) 109 99 (!) 115  Resp: 19 20 (!) 22 19  Temp: 98.1 F (36.7 C) 98 F (36.7 C)  98.2 F (36.8 C)  TempSrc: Oral Oral  Oral  SpO2: 94% 91% 94% 93%  Weight: 69.8 kg     Height: 6' 0.99 (1.854 m)       Intake/Output Summary (Last 24 hours) at 02/14/2024 1333 Last data filed at 02/14/2024 1000 Gross per 24 hour  Intake 3596.64 ml  Output 3425 ml  Net 171.64 ml   Wt Readings from Last 3 Encounters:  02/14/24 69.8 kg  01/21/24 68.9 kg  01/19/24 68 kg    Examination:  Constitutional: NAD Eyes: lids and conjunctivae normal, no  scleral icterus ENMT: mmm Neck: normal, supple Respiratory: clear to auscultation bilaterally, no wheezing, no crackles.  Cardiovascular: Regular rate and rhythm, no murmurs / rubs / gallops.  Abdomen: soft, no distention, no tenderness. Bowel sounds positive.    Data Reviewed: I have independently reviewed following labs and imaging studies   CBC Recent Labs  Lab 02/10/24 0225 02/11/24 0259 02/12/24 0301 02/13/24 0255 02/14/24 0253  WBC 19.9* 18.4* 22.7* 22.4* 21.3*  HGB 9.4* 8.4* 9.0* 8.8* 8.6*  HCT 29.9* 26.8* 28.0* 28.2* 27.1*  PLT 329 284 301 286 300  MCV 80.8 81.7 80.2 81.5 80.7  MCH 25.4* 25.6* 25.8* 25.4* 25.6*  MCHC 31.4 31.3 32.1 31.2 31.7  RDW 18.6* 18.9* 19.4* 19.9* 19.9*    Recent Labs  Lab 02/08/24 1650 02/09/24 0240 02/10/24 0225 02/11/24 0259 02/12/24 0301 02/13/24 0255 02/14/24 0253  NA  --  137 136 136 134* 134* 134*  K  --  3.4* 3.5 4.8 4.3 4.8 4.4  CL  --  94* 92* 105 99 105 106  CO2  --  33* 33* 26 24 21* 20*  GLUCOSE  --  99 87 81 103* 92 110*  BUN  --  18 21 19 16 11  6*  CREATININE  --  1.07 1.30* 1.07 1.10 0.92 0.87  CALCIUM   --  11.4* 10.5* 8.0* 9.1 8.7* 8.8*  AST  --   --   --  24  --   --  35  ALT  --   --   --  16  --   --  23  ALKPHOS  --   --   --  86  --   --  112  BILITOT  --   --   --  0.4  --   --  0.5  ALBUMIN   --   --   --  1.7*  --   --  1.8*  MG  --  1.6* 2.0 1.5* 2.4  --  1.5*  CRP 14.6*  --   --   --   --   --   --     ------------------------------------------------------------------------------------------------------------------ No results for input(s): CHOL, HDL, LDLCALC, TRIG, CHOLHDL, LDLDIRECT in the last 72 hours.  Lab Results  Component Value Date   HGBA1C 5.9 (H) 07/06/2023   ------------------------------------------------------------------------------------------------------------------ No results for input(s): TSH, T4TOTAL, T3FREE, THYROIDAB in the last 72 hours.  Invalid input(s):  FREET3  Cardiac Enzymes No results for input(s): CKMB, TROPONINI, MYOGLOBIN in the last 168 hours.  Invalid input(s): CK ------------------------------------------------------------------------------------------------------------------ No results found for: BNP  CBG: Recent Labs  Lab 02/10/24 1620  GLUCAP 107*    Recent Results (from the past 240 hours)  MTB-RIF NAA with AFB Culture, sputum     Status: None (Preliminary result)   Collection Time: 02/04/24  1:39 PM  Result Value Ref Range Status   Myco tuberculosis Complex NOT DETECTED NOT DETECTED Final   Rifampin Not applicable NOT DETECTED Final   AFB Specimen Processing Concentration  Final    Comment: (NOTE) Performed At: [facility] [address] [place], [place] [id number] Jennette Shorter MD Ey:[id number]    Acid Fast Culture PENDING  Incomplete   Source (MTB RIF) EXSPU  Final    Comment: Performed at [facility], [address], [place], [place] [address]  Expectorated Sputum Assessment w Gram Stain, Rflx to Resp Cult     Status: None   Collection Time: 02/04/24  1:40 PM   Specimen: INDUCED SPUTUM  Result Value Ref Range Status   Specimen Description INDUCED SPUTUM  Final   Special Requests NONE  Final   Sputum evaluation   Final    THIS SPECIMEN IS ACCEPTABLE FOR SPUTUM CULTURE Performed at [facility], [address], [place], [place] [address]    Report Status 02/04/2024 FINAL  Final  Culture, Respiratory w Gram Stain     Status: None   Collection Time: 02/04/24  1:40 PM   Specimen: INDUCED SPUTUM  Result Value Ref Range Status   Specimen Description INDUCED SPUTUM  Final   Special Requests NONE Reflexed from [id number]  Final   Gram Stain   Final    FEW WBC PRESENT, PREDOMINANTLY PMN RARE BUDDING YEAST SEEN    Culture   Final    RARE CANDIDA GLABRATA RARE Normal respiratory flora-no Staph aureus or Pseudomonas seen Performed at [facility], [address],  [place], [place] [address]    Report Status 02/07/2024 FINAL  Final  Acid Fast Smear (AFB)     Status: None   Collection Time: 02/05/24  2:25 PM  Result Value Ref Range Status   AFB Specimen Processing Concentration  Final   Acid Fast Smear Negative  Final    Comment: (NOTE) Performed At: [facility] [address] [place], [place] [id number] Jennette Shorter MD Ey:[id number]    Source (AFB) INDUCED SPUTUM  Final    Comment: Performed at [facility], [address], [place], [place] [address]  Acid Fast Smear (AFB)     Status: None   Collection Time: 02/06/24  9:38 AM   Specimen: Sputum  Result Value Ref Range Status   AFB Specimen Processing Concentration  Final   Acid Fast Smear Negative  Final    Comment: (NOTE) Performed At: [facility] [address] [place], [place] [id number] Jennette Shorter MD Ey:[id number]    Source (AFB) SPU  Final    Comment: Performed at [facility], [address], [place], [place] [address]  MRSA Next Gen by PCR, Nasal     Status: None   Collection Time: 02/08/24  3:42 PM   Specimen: Nasal Mucosa; Nasal Swab  Result Value Ref Range Status   MRSA by PCR Next Gen NOT DETECTED NOT DETECTED Final    Comment: (NOTE) The GeneXpert MRSA Assay (FDA approved for NASAL specimens only), is one component of a comprehensive MRSA colonization surveillance program. It is not intended to diagnose MRSA infection nor to guide or monitor treatment for MRSA infections. Test performance is not FDA approved in patients less than 14 years old. Performed at [facility], [address] [place], [place] [address]   Blastomyces Antigen     Status: None   Collection Time: 02/08/24  4:50 PM   Specimen: Blood  Result Value Ref Range Status   Blastomyces Antigen None Detected None Detected ng/mL Final    Comment: (NOTE) Reference Interval: None Detected Reportable Range: 0.31 ng/mL - 20.00 ng/mL Results above 20.00 ng/mL are reported as  'Positive, Above the Limit of Quantification' This test was developed and its performance characteristics determined by [employer]. It has not been cleared or approved by the FDA; however, FDA clearance or approval is not currently required for clinical use. The results are not intended to be used as the sole means for clinical diagnosis or patient decisions.    Interpretation Negative  Final   Specimen Type SERUM  Final    Comment: (NOTE) Performed At: [facility] [address] [place], [place] [id number] [place] Pac MD Ey:[id number]   Body fluid culture w Gram Stain     Status: None   Collection Time: 02/10/24  1:32 PM   Specimen: Pleural Fluid  Result Value Ref Range Status   Specimen Description PLEURAL  Final   Special Requests NONE  Final   Gram Stain   Final    WBC PRESENT, PREDOMINANTLY MONONUCLEAR NO ORGANISMS SEEN CYTOSPIN SMEAR    Culture   Final    NO GROWTH 3 DAYS Performed at [facility], [address] [place], [place] [address]    Report Status 02/13/2024 FINAL  Final     Radiology Studies: No results found.    Nilda Fendt, MD, PhD [facility]  Between 7 am - 7 pm I am available, please contact me via Amion (for emergencies) or Securechat (non urgent messages)  Between 7 pm - 7 am I am not available, please contact night coverage MD/APP via Amion

## 2024-02-14 NOTE — Progress Notes (Signed)
 PHARMACY - ANTICOAGULATION  Pharmacy Consult for heparin  Indication: atrial fibrillation   Allergies  Allergen Reactions   Cymbalta  [Duloxetine  Hcl]     unknown   Effexor [Venlafaxine]     Feels bad   Wellbutrin  [Bupropion ]     sleepy   Zoloft [Sertraline Hcl]     Felt bad    Patient Measurements: Height: 6' 0.99 (185.4 cm) Weight: 69.8 kg (153 lb 14.1 oz) IBW/kg (Calculated) : 79.88 HEPARIN  DW (KG): 65.2  Vital Signs: Temp: 98.1 F (36.7 C) (08/17 0503) Temp Source: Oral (08/17 0503) BP: 110/81 (08/17 0503) Pulse Rate: 90 (08/17 0503)  Labs: Recent Labs    02/12/24 0301 02/13/24 0255 02/14/24 0253  HGB 9.0* 8.8* 8.6*  HCT 28.0* 28.2* 27.1*  PLT 301 286 300  HEPARINUNFRC 0.35 0.38 0.18*  CREATININE 1.10 0.92 0.87    Estimated Creatinine Clearance: 75.8 mL/min (by C-G formula based on SCr of 0.87 mg/dL).   Assessment: 73 y.o. male with h/o Afib, PTA Xarelto  on hold (patient reported not taking anymore but fill history shows recent fills). Pharmacy has been consulted to dose IV heparin .  Heparin  level 0.18 is subtherapeutic with heparin  running at 1600 units/hr. Previously therapeutic at this rate. Hgb (8.6) and PLTs (300) are stable. Patient renal function is stable. Per RN, no report of pauses, issues with the line, or signs of bleeding.  Patient will remain on IV heparin  through next week 8/19 for planned procedure.  Goal of Therapy:  Heparin  level 0.3-0.7 units/ml Monitor platelets by anticoagulation protocol: Yes   Plan:  Increase heparin  to 1800 units/hr Recheck heparin  level after 8 hours to confirm therapeutic Monitor daily heparin  levels and CBC Monitor for any signs/symptoms of bleeding  Thank you for allowing pharmacy to be involved with this patient's care.  Mendel Barter, PharmD PGY1 Clinical Pharmacist [facility]  02/14/2024 7:13 AM

## 2024-02-14 NOTE — Progress Notes (Signed)
 PHARMACY - ANTICOAGULATION  Pharmacy Consult for heparin  Indication: atrial fibrillation   Allergies  Allergen Reactions   Cymbalta  [Duloxetine  Hcl]     unknown   Effexor [Venlafaxine]     Feels bad   Wellbutrin  [Bupropion ]     sleepy   Zoloft [Sertraline Hcl]     Felt bad    Patient Measurements: Height: 6' 0.99 (185.4 cm) Weight: 69.8 kg (153 lb 14.1 oz) IBW/kg (Calculated) : 79.88 HEPARIN  DW (KG): 69.8  Vital Signs: Temp: 98.5 F (36.9 C) (08/17 1658) Temp Source: Oral (08/17 1658) BP: 121/74 (08/17 1658) Pulse Rate: 96 (08/17 1658)  Labs: Recent Labs    02/12/24 0301 02/13/24 0255 02/14/24 0253 02/14/24 1738  HGB 9.0* 8.8* 8.6*  --   HCT 28.0* 28.2* 27.1*  --   PLT 301 286 300  --   HEPARINUNFRC 0.35 0.38 0.18* 0.35  CREATININE 1.10 0.92 0.87  --     Estimated Creatinine Clearance: 75.8 mL/min (by C-G formula based on SCr of 0.87 mg/dL).   Assessment: 73 y.o. male with h/o Afib, PTA Xarelto  on hold (patient reported not taking anymore but fill history shows recent fills). Pharmacy has been consulted to dose IV heparin .  Heparin  level 0.18 is subtherapeutic with heparin  running at 1600 units/hr. Previously therapeutic at this rate. Hgb (8.6) and PLTs (300) are stable. Patient renal function is stable. Per RN, no report of pauses, issues with the line, or signs of bleeding.  Patient will remain on IV heparin  through next week 8/19 for planned procedure.  PM: HL is therapeutic at 0.35 on 1800 units/hr. No issues with the infusion or bleeding reported per RN  Goal of Therapy:  Heparin  level 0.3-0.7 units/ml Monitor platelets by anticoagulation protocol: Yes   Plan:  Continue heparin  at 1800 units/hr Recheck heparin  level in 6 hours to confirm therapeutic Monitor daily heparin  levels and CBC Monitor for any signs/symptoms of bleeding  Thank you for allowing pharmacy to be involved with this patient's care.  Rocky Slade, PharmD, BCPS 02/14/2024 6:48  PM  Please check AMION for all [facility] Pharmacy phone numbers After 10:00 PM, call Main Pharmacy [phone]

## 2024-02-14 NOTE — Plan of Care (Signed)
   Problem: Education: Goal: Knowledge of General Education information will improve Description: Including pain rating scale, medication(s)/side effects and non-pharmacologic comfort measures Outcome: Progressing   Problem: Clinical Measurements: Goal: Will remain free from infection Outcome: Progressing   Problem: Clinical Measurements: Goal: Respiratory complications will improve Outcome: Progressing

## 2024-02-15 ENCOUNTER — Inpatient Hospital Stay (HOSPITAL_COMMUNITY)

## 2024-02-15 DIAGNOSIS — A419 Sepsis, unspecified organism: Secondary | ICD-10-CM | POA: Diagnosis not present

## 2024-02-15 DIAGNOSIS — R6520 Severe sepsis without septic shock: Secondary | ICD-10-CM | POA: Diagnosis not present

## 2024-02-15 LAB — CBC
HCT: 25.6 % — ABNORMAL LOW (ref 39.0–52.0)
Hemoglobin: 8.2 g/dL — ABNORMAL LOW (ref 13.0–17.0)
MCH: 25.8 pg — ABNORMAL LOW (ref 26.0–34.0)
MCHC: 32 g/dL (ref 30.0–36.0)
MCV: 80.5 fL (ref 80.0–100.0)
Platelets: 314 K/uL (ref 150–400)
RBC: 3.18 MIL/uL — ABNORMAL LOW (ref 4.22–5.81)
RDW: 20.1 % — ABNORMAL HIGH (ref 11.5–15.5)
WBC: 22.9 K/uL — ABNORMAL HIGH (ref 4.0–10.5)
nRBC: 0 % (ref 0.0–0.2)

## 2024-02-15 LAB — COMPREHENSIVE METABOLIC PANEL WITH GFR
ALT: 22 U/L (ref 0–44)
AST: 29 U/L (ref 15–41)
Albumin: 1.7 g/dL — ABNORMAL LOW (ref 3.5–5.0)
Alkaline Phosphatase: 108 U/L (ref 38–126)
Anion gap: 9 (ref 5–15)
BUN: 6 mg/dL — ABNORMAL LOW (ref 8–23)
CO2: 19 mmol/L — ABNORMAL LOW (ref 22–32)
Calcium: 8.4 mg/dL — ABNORMAL LOW (ref 8.9–10.3)
Chloride: 106 mmol/L (ref 98–111)
Creatinine, Ser: 0.9 mg/dL (ref 0.61–1.24)
GFR, Estimated: >60 mL/min (ref >60)
Glucose, Bld: 106 mg/dL — ABNORMAL HIGH (ref 70–99)
Potassium: 4.3 mmol/L (ref 3.5–5.1)
Sodium: 134 mmol/L — ABNORMAL LOW (ref 135–145)
Total Bilirubin: 0.3 mg/dL (ref 0.0–1.2)
Total Protein: 5.3 g/dL — ABNORMAL LOW (ref 6.5–8.1)

## 2024-02-15 LAB — HEPARIN LEVEL (UNFRACTIONATED): Heparin Unfractionated: 0.31 [IU]/mL (ref 0.30–0.70)

## 2024-02-15 LAB — MAGNESIUM: Magnesium: 2 mg/dL (ref 1.7–2.4)

## 2024-02-15 MED ORDER — HYDROCODONE-ACETAMINOPHEN 5-325 MG PO TABS
2.0000 | ORAL_TABLET | ORAL | Status: DC | PRN
  Administered 2024-02-15 – 2024-02-16 (×4): 2 via ORAL
  Filled 2024-02-15 (×5): qty 2

## 2024-02-15 MED ORDER — ACETAMINOPHEN 500 MG PO TABS
1000.0000 mg | ORAL_TABLET | Freq: Once | ORAL | Status: AC
  Administered 2024-02-15: 1000 mg via ORAL
  Filled 2024-02-15: qty 2

## 2024-02-15 NOTE — Progress Notes (Signed)
 PROGRESS NOTE  Bradley Ferguson FMW:[id number] DOB: 1951/04/25 DOA: 02/03/2024 PCP: Sebastian Beverley NOVAK, MD   LOS: 12 days   Brief Narrative / Interim history: 73 y.o. male with medical history significant of patient with history of COPD, interstitial lung disease, patient has latent tuberculosis, atrial fibrillation on Tikosyn  and Xarelto , peripheral arterial disease, essential hypertension, anxiety disorder depression, history of hematuria with lung consolidation was on antibiotics by pulmonary.  Patient went to see pulmonary today due to near syncope and productive cough.  He was found to have significant dyspnea and tachycardia with heart rate in the 120s.  Patient noted to have oxygen saturation 90% on room air.  He was however noted to have hypercalcemia, leukocytosis with left shift and lactic acidosis.   Subjective / 24h Interval events: Complains of pain, current regimen is not working  Assesement and Plan: Principal Problem:   Severe sepsis (HCC) Active Problems:   ANXIETY DEPRESSION   Bipolar I disorder (HCC)   Atrial fibrillation (HCC)   Peripheral arterial disease (HCC)   Chronic obstructive pulmonary disease (HCC)   Interstitial pulmonary disease (HCC)   TB lung, latent   Essential hypertension   Hypercalcemia  Principal problem Acute hypoxic respiratory failure in the setting of left upper lobe consolidation -CT scan on 8/1 showed slightly increase of the left upper lobe dense consolidation, concerning for infection versus malignancy.  He has been having worsening shortness of breath, cough, hemoptysis on presentation.  Pulmonary consulted and following.  Legionella negative. Histoplasma antigen negative. Fungitell beta D glucan negative. Blastomyces antigen negative as well - Given history of latent TB, he underwent AFB, now negative x 3 - Has been placed on Zosyn , continue.  Appreciate pulmonary follow-up, - Fluid cytology still pending.  He is scheduled to get a bronc  tomorrow, appreciate pulm follow-up  Active problems Concern for primary lung cancer -given left upper lobe mass, significant hypercalcemia there is a good likelihood of malignancy.  CT abdomen pelvis with contrast did show a left lateral psoas nodule concerning for metastasis - Postthoracentesis on Wednesday, cytology pending.  If negative may need IR consult of the left lateral psoas nodule versus video bronchoscopy which is already scheduled for tomorrow  Hypercalcemia of malignancy-over 15 on presentation.  PTH appropriately low, PTH RP pending.  Received fluids, Lasix , calcium  now is better.  8.8 today.  Continue to monitor  Acute kidney injury-resolved, creatinine back to baseline  Hypomagnesemia-magnesium  normalized this morning, continue to monitor  Pulmonary fibrosis, emphysema-contributing to his dyspnea  Persistent A-fib-continue dofetilide , on heparin   Concern for severe sepsis-sepsis physiology improved.  Likely due to his possible upper lobe consolidation.  Continue antibiotics  Bipolar 1 disorder-continue home regimen   Scheduled Meds:  budesonide -glycopyrrolate -formoterol   2 puff Inhalation BID   cholecalciferol   10,000 Units Oral QPM   dofetilide   500 mcg Oral BID   gabapentin   300 mg Oral TID   polyethylene glycol  17 g Oral Daily   pyridOXINE   100 mg Oral QPM   Continuous Infusions:  sodium chloride  75 mL/hr at 02/14/24 1504   heparin  1,800 Units/hr (02/15/24 0302)   piperacillin -tazobactam 3.375 g (02/15/24 0623)   PRN Meds:.albuterol , bisacodyl , bisacodyl , guaiFENesin -dextromethorphan , HYDROcodone -acetaminophen , ketoconazole , mouth rinse, triamcinolone  cream  Current Outpatient Medications  Medication Instructions   albuterol  (PROVENTIL ) 2.5 mg, Nebulization, Every 6 hours PRN   albuterol  (VENTOLIN  HFA) 108 (90 Base) MCG/ACT inhaler Use  2 inhalations  15 minutes apart  every 4 hours  to rescue Asthma Attack  atorvastatin  (LIPITOR ) 80 MG tablet Take 1  tablet Daily for Cholesterol   budesonide -glycopyrrolate -formoterol  (BREZTRI  AEROSPHERE) 160-9-4.8 MCG/ACT AERO inhaler 2 puffs, Inhalation, 2 times daily   Cholecalciferol  10,000 Units, Every evening   dofetilide  (TIKOSYN ) 500 mcg, Oral, 2 times daily   gabapentin  (NEURONTIN ) 300 MG capsule Take 1 capsule 3 x /day for Neuropathy Pain   ketoconazole  (NIZORAL ) 2 % cream 1 Application, Daily PRN   oxyCODONE  (OXY IR/ROXICODONE ) 5 mg, Every 4 hours PRN   pyridOXINE  (VITAMIN B6) 100 mg, Every evening   rivaroxaban  (XARELTO ) 20 mg, Oral, Daily with supper   triamcinolone  cream (KENALOG ) 0.5 % 1 Application, Daily PRN    Diet Orders (From admission, onward)     Start     Ordered   02/03/24 1943  Diet Heart Room service appropriate? Yes; Fluid consistency: Thin  Diet effective now       Question Answer Comment  Room service appropriate? Yes   Fluid consistency: Thin      02/03/24 1944            DVT prophylaxis:    Lab Results  Component Value Date   PLT 314 02/15/2024      Code Status: Full Code  Family Communication: no family at bedside   Status is: Inpatient Remains inpatient appropriate because: Severity of illness   Level of care: Progressive  Consultants:  PCCM  Objective: Vitals:   02/15/24 0615 02/15/24 0749 02/15/24 0845 02/15/24 0849  BP: (!) 141/89 123/65    Pulse: 99 (!) 115 (!) 102   Resp: 17 18 18    Temp: 98.1 F (36.7 C) 98.7 F (37.1 C)    TempSrc: Oral Oral    SpO2: 93% 92% 95% 95%  Weight: 69.6 kg     Height:        Intake/Output Summary (Last 24 hours) at 02/15/2024 9047 Last data filed at 02/15/2024 0700 Gross per 24 hour  Intake 2261.29 ml  Output 2825 ml  Net -563.71 ml   Wt Readings from Last 3 Encounters:  02/15/24 69.6 kg  01/21/24 68.9 kg  01/19/24 68 kg    Examination:  Constitutional: NAD Eyes: lids and conjunctivae normal, no scleral icterus ENMT: mmm Neck: normal, supple Respiratory: clear to auscultation  bilaterally, no wheezing, no crackles.  Cardiovascular: Regular rate and rhythm, no murmurs / rubs / gallops.  Abdomen: soft, no distention, no tenderness. Bowel sounds positive.    Data Reviewed: I have independently reviewed following labs and imaging studies   CBC Recent Labs  Lab 02/11/24 0259 02/12/24 0301 02/13/24 0255 02/14/24 0253 02/15/24 0421  WBC 18.4* 22.7* 22.4* 21.3* 22.9*  HGB 8.4* 9.0* 8.8* 8.6* 8.2*  HCT 26.8* 28.0* 28.2* 27.1* 25.6*  PLT 284 301 286 300 314  MCV 81.7 80.2 81.5 80.7 80.5  MCH 25.6* 25.8* 25.4* 25.6* 25.8*  MCHC 31.3 32.1 31.2 31.7 32.0  RDW 18.9* 19.4* 19.9* 19.9* 20.1*    Recent Labs  Lab 02/08/24 1650 02/09/24 0240 02/10/24 0225 02/11/24 0259 02/12/24 0301 02/13/24 0255 02/14/24 0253 02/15/24 0421  NA  --    < > 136 136 134* 134* 134* 134*  K  --    < > 3.5 4.8 4.3 4.8 4.4 4.3  CL  --    < > 92* 105 99 105 106 106  CO2  --    < > 33* 26 24 21* 20* 19*  GLUCOSE  --    < > 87 81 103* 92 110* 106*  BUN  --    < > 21 19 16 11  6* 6*  CREATININE  --    < > 1.30* 1.07 1.10 0.92 0.87 0.90  CALCIUM   --    < > 10.5* 8.0* 9.1 8.7* 8.8* 8.4*  AST  --   --   --  24  --   --  35 29  ALT  --   --   --  16  --   --  23 22  ALKPHOS  --   --   --  86  --   --  112 108  BILITOT  --   --   --  0.4  --   --  0.5 0.3  ALBUMIN   --   --   --  1.7*  --   --  1.8* 1.7*  MG  --    < > 2.0 1.5* 2.4  --  1.5* 2.0  CRP 14.6*  --   --   --   --   --   --   --    < > = values in this interval not displayed.    ------------------------------------------------------------------------------------------------------------------ No results for input(s): CHOL, HDL, LDLCALC, TRIG, CHOLHDL, LDLDIRECT in the last 72 hours.  Lab Results  Component Value Date   HGBA1C 5.9 (H) 07/06/2023   ------------------------------------------------------------------------------------------------------------------ No results for input(s): TSH, T4TOTAL,  T3FREE, THYROIDAB in the last 72 hours.  Invalid input(s): FREET3  Cardiac Enzymes No results for input(s): CKMB, TROPONINI, MYOGLOBIN in the last 168 hours.  Invalid input(s): CK ------------------------------------------------------------------------------------------------------------------ No results found for: BNP  CBG: Recent Labs  Lab 02/10/24 1620  GLUCAP 107*    Recent Results (from the past 240 hours)  Acid Fast Smear (AFB)     Status: None   Collection Time: 02/05/24  2:25 PM  Result Value Ref Range Status   AFB Specimen Processing Concentration  Final   Acid Fast Smear Negative  Final    Comment: (NOTE) Performed At: [facility] [address] [place], [place] [id number] Jennette Shorter MD Ey:[id number]    Source (AFB) INDUCED SPUTUM  Final    Comment: Performed at [facility], [address] [place], [place] [address]  Acid Fast Smear (AFB)     Status: None   Collection Time: 02/06/24  9:38 AM   Specimen: Sputum  Result Value Ref Range Status   AFB Specimen Processing Concentration  Final   Acid Fast Smear Negative  Final    Comment: (NOTE) Performed At: [facility] [address] [place], [place] [id number] Jennette Shorter MD Ey:[id number]    Source (AFB) SPU  Final    Comment: Performed at [facility], [address] [place], [place] [address]  MRSA Next Gen by PCR, Nasal     Status: None   Collection Time: 02/08/24  3:42 PM   Specimen: Nasal Mucosa; Nasal Swab  Result Value Ref Range Status   MRSA by PCR Next Gen NOT DETECTED NOT DETECTED Final    Comment: (NOTE) The GeneXpert MRSA Assay (FDA approved for NASAL specimens only), is one component of a comprehensive MRSA colonization surveillance program. It is not intended to diagnose MRSA infection nor to guide or monitor treatment for MRSA infections. Test performance is not FDA approved in patients less than 52 years old. Performed at [facility], [address] [place], [place] [address]   Blastomyces Antigen     Status: None   Collection  Time: 02/08/24  4:50 PM   Specimen: Blood  Result Value Ref Range Status   Blastomyces Antigen None Detected None Detected ng/mL Final    Comment: (NOTE) Reference Interval: None Detected Reportable Range: 0.31 ng/mL - 20.00 ng/mL Results above 20.00 ng/mL are reported as 'Positive, Above the Limit of Quantification' This test was developed and its performance characteristics determined by [employer] [facility]. It has not been cleared or approved by the FDA; however, FDA clearance or approval is not currently required for clinical use. The results are not intended to be used as the sole means for clinical diagnosis or patient decisions.    Interpretation Negative  Final   Specimen Type SERUM  Final    Comment: (NOTE) Performed At: [facility] [address] [place], [place] [id number] Charleston Pac MD Ey:[id number]   Body fluid culture w Gram Stain     Status: None   Collection Time: 02/10/24  1:32 PM   Specimen: Pleural Fluid  Result Value Ref Range Status   Specimen Description PLEURAL  Final   Special Requests NONE  Final   Gram Stain   Final    WBC PRESENT, PREDOMINANTLY MONONUCLEAR NO ORGANISMS SEEN CYTOSPIN SMEAR    Culture   Final    NO GROWTH 3 DAYS Performed at [facility], [address], [place], [place] [address]    Report Status 02/13/2024 FINAL  Final     Radiology Studies: No results found.    Nilda Fendt, MD, PhD [facility]  Between 7 am - 7 pm I am available, please contact me via Amion (for emergencies) or Securechat (non urgent messages)  Between 7 pm - 7 am I am not available, please contact night coverage MD/APP via Amion

## 2024-02-15 NOTE — Progress Notes (Signed)
 Physical Therapy Treatment Patient Details Name: Bradley Ferguson MRN: [id number] DOB: 06/29/1951 Today's Date: 02/15/2024   History of Present Illness 73 y.o. male admitted 02/03/24 for near syncope at pulmonologist appt. 8/1 CT with increase in size of dense masslike consolidation & pleural effusion. PMH: COPD, ILD, Afib, HTN, PAD s/p bil fem endarerectomy, HLD, raynaud's syndrome, latent tuberculosis, tobacco abuse    PT Comments  The pt was agreeable to session after getting some rest earlier this afternoon. He presents with decreased activity tolerance, needing seated rest after ~32ft hallway ambulation with use of RW. The pt was able to recover after ~5 min seated rest and return to room with VSS. Will continue to benefit from skilled PT acutely to progress functional strength, power, and endurance to progress back to greatest level of independence. Will continue efforts acutely, recommendations remain appropriate.     If plan is discharge home, recommend the following: A little help with walking and/or transfers;A little help with bathing/dressing/bathroom;Assistance with cooking/housework;Assist for transportation;Help with stairs or ramp for entrance   Can travel by private vehicle     Yes  Equipment Recommendations  Other (comment) (defer to next venue)    Recommendations for Other Services       Precautions / Restrictions Precautions Precautions: Fall;Other (comment) Recall of Precautions/Restrictions: Impaired Precaution/Restrictions Comments: watch sats Restrictions Weight Bearing Restrictions Per Provider Order: No     Mobility  Bed Mobility Overal bed mobility: Needs Assistance Bed Mobility: Supine to Sit, Sit to Supine     Supine to sit: Supervision, HOB elevated Sit to supine: Supervision, HOB elevated   General bed mobility comments: Supervision for safety    Transfers Overall transfer level: Needs assistance Equipment used: Rolling walker (2  wheels) Transfers: Sit to/from Stand Sit to Stand: Min assist           General transfer comment: minA with cues for hand placement. pt with poor power up and dependent on UE and use of momentum. . improved with reps in session    Ambulation/Gait Ambulation/Gait assistance: Min assist Gait Distance (Feet): 40 Feet (+ 40 ft) Assistive device: Rolling walker (2 wheels) Gait Pattern/deviations: Step-through pattern, Decreased step length - right, Decreased step length - left, Decreased stride length, Trunk flexed Gait velocity: reduced     General Gait Details: Pt ambulates with a flexed posture and decreased bil step length. Verbal cues provided to stand upright. MinA for balance. Distance limited by increased pt generalized fatigue requiring seated rest after 40 ft, then able to return to room. VSS on RA SpO2 94%   Stairs             Wheelchair Mobility     Tilt Bed    Modified Rankin (Stroke Patients Only)       Balance Overall balance assessment: Needs assistance, History of Falls Sitting-balance support: No upper extremity supported, Feet supported Sitting balance-Leahy Scale: Good     Standing balance support: Bilateral upper extremity supported, Reliant on assistive device for balance, During functional activity Standing balance-Leahy Scale: Poor Standing balance comment: reliant on RW                            Communication Communication Communication: No apparent difficulties  Cognition Arousal: Alert Behavior During Therapy: WFL for tasks assessed/performed   PT - Cognitive impairments: No apparent impairments  Following commands: Intact      Cueing Cueing Techniques: Verbal cues, Gestural cues, Tactile cues  Exercises      General Comments General comments (skin integrity, edema, etc.): VSS on RA, SpO2 94%      Pertinent Vitals/Pain Pain Assessment Pain Assessment: No/denies pain Pain  Intervention(s): Monitored during session     PT Goals (current goals can now be found in the care plan section) Acute Rehab PT Goals Patient Stated Goal: be able to walk PT Goal Formulation: With patient Time For Goal Achievement: 02/19/24 Potential to Achieve Goals: Good Progress towards PT goals: Progressing toward goals    Frequency    Min 2X/week       AM-PAC PT 6 Clicks Mobility   Outcome Measure  Help needed turning from your back to your side while in a flat bed without using bedrails?: A Little Help needed moving from lying on your back to sitting on the side of a flat bed without using bedrails?: A Little Help needed moving to and from a bed to a chair (including a wheelchair)?: A Little Help needed standing up from a chair using your arms (e.g., wheelchair or bedside chair)?: A Little Help needed to walk in hospital room?: A Little Help needed climbing 3-5 steps with a railing? : Total 6 Click Score: 16    End of Session Equipment Utilized During Treatment: Gait belt Activity Tolerance: Patient limited by fatigue Patient left: with call bell/phone within reach;in bed;with bed alarm set Nurse Communication: Mobility status (NT) PT Visit Diagnosis: Other abnormalities of gait and mobility (R26.89);Difficulty in walking, not elsewhere classified (R26.2);Muscle weakness (generalized) (M62.81);Unsteadiness on feet (R26.81)     Time: 8559-8491 PT Time Calculation (min) (ACUTE ONLY): 28 min  Charges:    $Gait Training: 8-22 mins $Therapeutic Exercise: 8-22 mins PT General Charges $$ ACUTE PT VISIT: 1 Visit                     Izetta Call, PT, DPT   Acute Rehabilitation Department Office [phone] Secure Chat Communication Preferred   Izetta JULIANNA Call 02/15/2024, 3:29 PM

## 2024-02-15 NOTE — Progress Notes (Signed)
 PHARMACY - ANTICOAGULATION  Pharmacy Consult for heparin  Indication: atrial fibrillation   Allergies  Allergen Reactions   Cymbalta  [Duloxetine  Hcl]     unknown   Effexor [Venlafaxine]     Feels bad   Wellbutrin  [Bupropion ]     sleepy   Zoloft [Sertraline Hcl]     Felt bad    Patient Measurements: Height: 6' 0.99 (185.4 cm) Weight: 69.6 kg (153 lb 7 oz) IBW/kg (Calculated) : 79.88 HEPARIN  DW (KG): 69.8  Vital Signs: Temp: 98.7 F (37.1 C) (08/18 0749) Temp Source: Oral (08/18 0749) BP: 123/65 (08/18 0749) Pulse Rate: 102 (08/18 0845)  Labs: Recent Labs    02/13/24 0255 02/14/24 0253 02/14/24 1738 02/14/24 2236 02/15/24 0421  HGB 8.8* 8.6*  --   --  8.2*  HCT 28.2* 27.1*  --   --  25.6*  PLT 286 300  --   --  314  HEPARINUNFRC 0.38 0.18* 0.35 0.41 0.31  CREATININE 0.92 0.87  --   --  0.90    Estimated Creatinine Clearance: 73 mL/min (by C-G formula based on SCr of 0.9 mg/dL).   Assessment: 73 y.o. male with h/o Afib, PTA Xarelto  on hold (patient reported not taking anymore but fill history shows recent fills). Pharmacy has been consulted to dose IV heparin .  Patient will remain on IV heparin  through next week 8/19 for planned procedure.  Heparin  level is therapeutic at 0.31 on low end of goal on 1800 units/hr. No bleeding noted, Hgb low stable, platelets are normal.  Goal of Therapy:  Heparin  level 0.3-0.7 units/ml Monitor platelets by anticoagulation protocol: Yes   Plan:  Increase heparin  infusion to 1850 units/hr Monitor daily heparin  levels and CBC Monitor for any signs/symptoms of bleeding  Thank you for involving pharmacy in this patient's care.  Delon Sax, PharmD, BCPS Clinical Pharmacist Clinical phone for 02/15/2024 is [phone] 02/15/2024 9:28 AM

## 2024-02-15 NOTE — Progress Notes (Signed)
 SLP Note  Patient Details Name: Bradley Ferguson MRN: [id number] DOB: January 31, 1951   Cancelled treatment:        Saw pt briefly in room.  He still c/o difficulty swallowing going on for 10 years.  Endorses feeling of stasis and food/drink going the wrong way.  He says he can only drink 1/2 tsp of water  at a time. Pt tolerated a straw sip of water  while SLP was in room.  Again offered swallowing study.  Explained MBS procedure and what information it might give us .  He declines again today wishing to complete his work up for pna and lung cancer first.  Pt could pursue further swallowing work up as outpatient, or if he decides to proceed with MBSS prior to d/c from hospital, please re-consult ST.  SLP will sign off at this time.  Recommend continuing current diet.    Anette FORBES Grippe, MA, CCC-SLP Acute Rehabilitation Services Office: [phone] 02/15/2024, 10:43 AM

## 2024-02-15 NOTE — Progress Notes (Signed)
 PT Cancellation Note  Patient Details Name: Bradley Ferguson MRN: [id number] DOB: [birth year]   Cancelled Treatment:    Reason Eval/Treat Not Completed: Patient declined, no reason specified, pt reports fatigue and pain despite pain medications this morning. He also endorsed feeling hot, sweaty, and dizzy due to medications. VSS, pt declined all movement until later in the day, requesting PT return at 2:00PM. Pt given ice chips per request, will attempt later in the day.   Bradley Ferguson, PT, DPT   Acute Rehabilitation Department Office [phone] Secure Chat Communication Preferred   Bradley Ferguson 02/15/2024, 10:50 AM

## 2024-02-15 NOTE — Plan of Care (Signed)
   Problem: Education: Goal: Knowledge of General Education information will improve Description Including pain rating scale, medication(s)/side effects and non-pharmacologic comfort measures Outcome: Progressing   Problem: Health Behavior/Discharge Planning: Goal: Ability to manage health-related needs will improve Outcome: Progressing

## 2024-02-15 NOTE — TOC Progression Note (Signed)
 Transition of Care [facility]) - Progression Note    Patient Details  Name: Bradley Ferguson MRN: [id number] Date of Birth: August 16, 1950  [facility] CM/SW Contact  [name], [place] Phone Number: 02/15/2024, 11:17 AM  Clinical Narrative:   Per daily meeting with treatment team and chart review, patient to potentially have a bronchoscopy today or tomorrow.  Per MD, not MR at this time.    CSW will continue to follow.      Expected Discharge Plan: Skilled Nursing Facility Barriers to Discharge: Continued Medical Work up, SNF Pending bed offer, Insurance Authorization               Expected Discharge Plan and Services In-house Referral: Clinical Social Work     Living arrangements for the past 2 months: Single Family Home                                       Social Drivers of Health (SDOH) Interventions SDOH Screenings   Food Insecurity: Food Insecurity Present (02/03/2024)  Housing: Low Risk  (02/03/2024)  Transportation Needs: No Transportation Needs (02/03/2024)  Utilities: Not At Risk (02/03/2024)  Alcohol Screen: Low Risk  (12/29/2023)  Depression (PHQ2-9): Medium Risk (11/13/2023)  Financial Resource Strain: Medium Risk (12/29/2023)  Physical Activity: Inactive (12/29/2023)  Social Connections: Socially Isolated (02/03/2024)  Stress: Stress Concern Present (12/29/2023)  Tobacco Use: High Risk (02/03/2024)  Health Literacy: Adequate Health Literacy (11/13/2023)    Readmission Risk Interventions    02/04/2024    3:23 PM 11/10/2023    2:54 PM  Readmission Risk Prevention Plan  Post Dischage Appt  Complete  Medication Screening  Complete  Transportation Screening Complete Complete  PCP or Specialist Appt within 5-7 Days Complete   Home Care Screening Complete   Medication Review (RN CM) Complete

## 2024-02-16 ENCOUNTER — Encounter (HOSPITAL_COMMUNITY): Admission: EM | Disposition: E | Payer: Self-pay | Source: Home / Self Care | Attending: Internal Medicine

## 2024-02-16 ENCOUNTER — Encounter (HOSPITAL_COMMUNITY): Payer: Self-pay | Admitting: Student

## 2024-02-16 ENCOUNTER — Ambulatory Visit (HOSPITAL_COMMUNITY): Admission: RE | Admit: 2024-02-16 | Source: Home / Self Care | Admitting: Emergency Medicine

## 2024-02-16 ENCOUNTER — Inpatient Hospital Stay (HOSPITAL_COMMUNITY)

## 2024-02-16 ENCOUNTER — Inpatient Hospital Stay (HOSPITAL_COMMUNITY): Payer: Self-pay

## 2024-02-16 DIAGNOSIS — R918 Other nonspecific abnormal finding of lung field: Secondary | ICD-10-CM | POA: Diagnosis not present

## 2024-02-16 DIAGNOSIS — A419 Sepsis, unspecified organism: Secondary | ICD-10-CM | POA: Diagnosis not present

## 2024-02-16 DIAGNOSIS — R590 Localized enlarged lymph nodes: Secondary | ICD-10-CM | POA: Diagnosis present

## 2024-02-16 DIAGNOSIS — R6520 Severe sepsis without septic shock: Secondary | ICD-10-CM | POA: Diagnosis not present

## 2024-02-16 DIAGNOSIS — R911 Solitary pulmonary nodule: Secondary | ICD-10-CM

## 2024-02-16 DIAGNOSIS — F17210 Nicotine dependence, cigarettes, uncomplicated: Secondary | ICD-10-CM

## 2024-02-16 DIAGNOSIS — I10 Essential (primary) hypertension: Secondary | ICD-10-CM | POA: Diagnosis not present

## 2024-02-16 HISTORY — PX: FLEXIBLE BRONCHOSCOPY: SHX5094

## 2024-02-16 HISTORY — PX: ENDOBRONCHIAL ULTRASOUND: SHX5096

## 2024-02-16 HISTORY — PX: BRONCHIAL BIOPSY: SHX5109

## 2024-02-16 HISTORY — PX: BRONCHIAL WASHINGS: SHX5105

## 2024-02-16 HISTORY — PX: BRONCHIAL NEEDLE ASPIRATION BIOPSY: SHX5106

## 2024-02-16 LAB — CBC
HCT: 24.8 % — ABNORMAL LOW (ref 39.0–52.0)
Hemoglobin: 7.9 g/dL — ABNORMAL LOW (ref 13.0–17.0)
MCH: 25.7 pg — ABNORMAL LOW (ref 26.0–34.0)
MCHC: 31.9 g/dL (ref 30.0–36.0)
MCV: 80.8 fL (ref 80.0–100.0)
Platelets: 293 K/uL (ref 150–400)
RBC: 3.07 MIL/uL — ABNORMAL LOW (ref 4.22–5.81)
RDW: 20.6 % — ABNORMAL HIGH (ref 11.5–15.5)
WBC: 21.1 K/uL — ABNORMAL HIGH (ref 4.0–10.5)
nRBC: 0 % (ref 0.0–0.2)

## 2024-02-16 LAB — COMPREHENSIVE METABOLIC PANEL WITH GFR
ALT: 20 U/L (ref 0–44)
AST: 30 U/L (ref 15–41)
Albumin: 1.5 g/dL — ABNORMAL LOW (ref 3.5–5.0)
Alkaline Phosphatase: 115 U/L (ref 38–126)
Anion gap: 8 (ref 5–15)
BUN: 7 mg/dL — ABNORMAL LOW (ref 8–23)
CO2: 20 mmol/L — ABNORMAL LOW (ref 22–32)
Calcium: 8.8 mg/dL — ABNORMAL LOW (ref 8.9–10.3)
Chloride: 108 mmol/L (ref 98–111)
Creatinine, Ser: 1.01 mg/dL (ref 0.61–1.24)
GFR, Estimated: >60 mL/min (ref >60)
Glucose, Bld: 120 mg/dL — ABNORMAL HIGH (ref 70–99)
Potassium: 3.7 mmol/L (ref 3.5–5.1)
Sodium: 136 mmol/L (ref 135–145)
Total Bilirubin: <0.2 mg/dL (ref 0.0–1.2)
Total Protein: 4.9 g/dL — ABNORMAL LOW (ref 6.5–8.1)

## 2024-02-16 LAB — MAGNESIUM: Magnesium: 1.6 mg/dL — ABNORMAL LOW (ref 1.7–2.4)

## 2024-02-16 LAB — TYPE AND SCREEN
ABO/RH(D): O POS
Antibody Screen: NEGATIVE

## 2024-02-16 LAB — PTH-RELATED PEPTIDE: PTH-related peptide: <2 pmol/L

## 2024-02-16 LAB — HEPARIN LEVEL (UNFRACTIONATED): Heparin Unfractionated: 0.21 [IU]/mL — ABNORMAL LOW (ref 0.30–0.70)

## 2024-02-16 SURGERY — ENDOBRONCHIAL ULTRASOUND (EBUS)
Anesthesia: General

## 2024-02-16 MED ORDER — ONDANSETRON HCL 4 MG/2ML IJ SOLN
INTRAMUSCULAR | Status: DC | PRN
  Administered 2024-02-16: 4 mg via INTRAVENOUS

## 2024-02-16 MED ORDER — CHLORHEXIDINE GLUCONATE 0.12 % MT SOLN
OROMUCOSAL | Status: AC
  Filled 2024-02-16: qty 15

## 2024-02-16 MED ORDER — FENTANYL CITRATE (PF) 100 MCG/2ML IJ SOLN
INTRAMUSCULAR | Status: AC
  Filled 2024-02-16: qty 2

## 2024-02-16 MED ORDER — PROPOFOL 500 MG/50ML IV EMUL
INTRAVENOUS | Status: DC | PRN
  Administered 2024-02-16: 125 ug/kg/min via INTRAVENOUS

## 2024-02-16 MED ORDER — OXYCODONE HCL 5 MG PO TABS: 5.0000 mg | ORAL_TABLET | Freq: Once | ORAL | Status: DC | PRN

## 2024-02-16 MED ORDER — LIDOCAINE 2% (20 MG/ML) 5 ML SYRINGE
INTRAMUSCULAR | Status: DC | PRN
  Administered 2024-02-16: 100 mg via INTRAVENOUS

## 2024-02-16 MED ORDER — SODIUM CHLORIDE (PF) 0.9 % IJ SOLN
PREFILLED_SYRINGE | INTRAMUSCULAR | Status: DC | PRN
  Administered 2024-02-16: 4 mL
  Administered 2024-02-16: 2 mL

## 2024-02-16 MED ORDER — HEPARIN (PORCINE) 25000 UT/250ML-% IV SOLN
2000.0000 [IU]/h | INTRAVENOUS | Status: DC
  Administered 2024-02-17 – 2024-02-18 (×4): 1850 [IU]/h via INTRAVENOUS
  Administered 2024-02-19: 1900 [IU]/h via INTRAVENOUS
  Administered 2024-02-19: 1850 [IU]/h via INTRAVENOUS
  Filled 2024-02-16 (×6): qty 250

## 2024-02-16 MED ORDER — PROPOFOL 10 MG/ML IV BOLUS
INTRAVENOUS | Status: DC | PRN
  Administered 2024-02-16: 20 mg via INTRAVENOUS
  Administered 2024-02-16: 120 mg via INTRAVENOUS

## 2024-02-16 MED ORDER — ROCURONIUM BROMIDE 10 MG/ML (PF) SYRINGE
PREFILLED_SYRINGE | INTRAVENOUS | Status: DC | PRN
  Administered 2024-02-16: 50 mg via INTRAVENOUS

## 2024-02-16 MED ORDER — FENTANYL CITRATE (PF) 100 MCG/2ML IJ SOLN: 25.0000 ug | INTRAMUSCULAR | Status: DC | PRN

## 2024-02-16 MED ORDER — FENTANYL CITRATE (PF) 250 MCG/5ML IJ SOLN
INTRAMUSCULAR | Status: DC | PRN
  Administered 2024-02-16 (×2): 50 ug via INTRAVENOUS

## 2024-02-16 MED ORDER — OXYCODONE HCL 5 MG PO TABS
5.0000 mg | ORAL_TABLET | ORAL | Status: DC | PRN
  Administered 2024-02-16 – 2024-02-17 (×3): 5 mg via ORAL
  Administered 2024-02-17 (×2): 10 mg via ORAL
  Administered 2024-02-17: 5 mg via ORAL
  Administered 2024-02-17 – 2024-02-18 (×3): 10 mg via ORAL
  Administered 2024-02-18: 5 mg via ORAL
  Administered 2024-02-18 – 2024-02-21 (×13): 10 mg via ORAL
  Administered 2024-02-22: 5 mg via ORAL
  Administered 2024-02-22 – 2024-02-23 (×6): 10 mg via ORAL
  Administered 2024-02-24: 5 mg via ORAL
  Administered 2024-02-24 – 2024-02-27 (×10): 10 mg via ORAL
  Filled 2024-02-16 (×6): qty 2
  Filled 2024-02-16: qty 1
  Filled 2024-02-16 (×2): qty 2
  Filled 2024-02-16: qty 1
  Filled 2024-02-16 (×2): qty 2
  Filled 2024-02-16: qty 1
  Filled 2024-02-16 (×9): qty 2
  Filled 2024-02-16: qty 1
  Filled 2024-02-16 (×7): qty 2
  Filled 2024-02-16: qty 1
  Filled 2024-02-16: qty 2
  Filled 2024-02-16: qty 1
  Filled 2024-02-16 (×5): qty 2
  Filled 2024-02-16: qty 1
  Filled 2024-02-16 (×2): qty 2

## 2024-02-16 MED ORDER — OXYCODONE HCL 5 MG/5ML PO SOLN: 5.0000 mg | Freq: Once | ORAL | Status: DC | PRN

## 2024-02-16 MED ORDER — CHLORHEXIDINE GLUCONATE 0.12 % MT SOLN
15.0000 mL | Freq: Once | OROMUCOSAL | Status: AC
  Administered 2024-02-16: 15 mL via OROMUCOSAL

## 2024-02-16 MED ORDER — AMISULPRIDE (ANTIEMETIC) 5 MG/2ML IV SOLN: 10.0000 mg | Freq: Once | INTRAVENOUS | Status: DC | PRN

## 2024-02-16 MED ORDER — DEXAMETHASONE SODIUM PHOSPHATE 10 MG/ML IJ SOLN
INTRAMUSCULAR | Status: DC | PRN
  Administered 2024-02-16: 10 mg via INTRAVENOUS

## 2024-02-16 MED ORDER — POTASSIUM CHLORIDE CRYS ER 20 MEQ PO TBCR
40.0000 meq | EXTENDED_RELEASE_TABLET | Freq: Once | ORAL | Status: AC
Start: 2024-02-16 — End: 2024-02-16
  Administered 2024-02-16: 40 meq via ORAL
  Filled 2024-02-16: qty 2

## 2024-02-16 MED ORDER — ALBUMIN HUMAN 5 % IV SOLN: INTRAVENOUS | Status: DC | PRN

## 2024-02-16 MED ORDER — SUGAMMADEX SODIUM 200 MG/2ML IV SOLN
INTRAVENOUS | Status: DC | PRN
  Administered 2024-02-16: 200 mg via INTRAVENOUS

## 2024-02-16 MED ORDER — PHENYLEPHRINE 80 MCG/ML (10ML) SYRINGE FOR IV PUSH (FOR BLOOD PRESSURE SUPPORT)
PREFILLED_SYRINGE | INTRAVENOUS | Status: DC | PRN
  Administered 2024-02-16 (×7): 160 ug via INTRAVENOUS

## 2024-02-16 MED ORDER — ESMOLOL HCL 100 MG/10ML IV SOLN
INTRAVENOUS | Status: DC | PRN
  Administered 2024-02-16: 20 mg via INTRAVENOUS

## 2024-02-16 MED ORDER — MAGNESIUM SULFATE 4 GM/100ML IV SOLN
4.0000 g | Freq: Once | INTRAVENOUS | Status: AC
  Administered 2024-02-16: 4 g via INTRAVENOUS
  Filled 2024-02-16: qty 100

## 2024-02-16 MED ORDER — PHENYLEPHRINE HCL-NACL 20-0.9 MG/250ML-% IV SOLN
INTRAVENOUS | Status: DC | PRN
  Administered 2024-02-16: 30 ug/min via INTRAVENOUS

## 2024-02-16 SURGICAL SUPPLY — 36 items
ADAPTER BRONCHOSCOPE OLYMPUS (ADAPTER) ×4 IMPLANT
ADAPTER VALVE BIOPSY EBUS (MISCELLANEOUS) IMPLANT
BAG COUNTER SPONGE SURGICOUNT (BAG) ×4 IMPLANT
BRUSH CYTOL CELLEBRITY 1.5X140 (MISCELLANEOUS) ×4 IMPLANT
BRUSH SUPERTRAX BIOPSY (INSTRUMENTS) IMPLANT
BRUSH SUPERTRAX NDL-TIP CYTO (INSTRUMENTS) ×4 IMPLANT
CANISTER SUCTION 3000ML PPV (SUCTIONS) ×4 IMPLANT
CNTNR URN SCR LID CUP LEK RST (MISCELLANEOUS) ×4 IMPLANT
COVER BACK TABLE 60X90IN (DRAPES) ×4 IMPLANT
FILTER STRAW FLUID ASPIR (MISCELLANEOUS) IMPLANT
FORCEPS BIOP 1.5 SINGLE USE (MISCELLANEOUS) ×4 IMPLANT
FORCEPS BIOP SUPERTRX PREMAR (INSTRUMENTS) ×4 IMPLANT
GAUZE SPONGE 4X4 12PLY STRL (GAUZE/BANDAGES/DRESSINGS) ×4 IMPLANT
GLOVE BIO SURGEON STRL SZ7.5 (GLOVE) ×8 IMPLANT
GOWN STRL REUS W/ TWL LRG LVL3 (GOWN DISPOSABLE) ×8 IMPLANT
KIT CLEAN ENDO COMPLIANCE (KITS) ×4 IMPLANT
KIT LOCATABLE GUIDE (CANNULA) IMPLANT
KIT MARKER FIDUCIAL DELIVERY (KITS) IMPLANT
KIT TURNOVER KIT B (KITS) ×4 IMPLANT
MARKER SKIN DUAL TIP RULER LAB (MISCELLANEOUS) ×4 IMPLANT
NDL SUPERTRX PREMARK BIOPSY (NEEDLE) ×3 IMPLANT
NEEDLE SUPERTRX PREMARK BIOPSY (NEEDLE) ×3 IMPLANT
NS IRRIG 1000ML POUR BTL (IV SOLUTION) ×4 IMPLANT
OIL SILICONE PENTAX (PARTS (SERVICE/REPAIRS)) ×4 IMPLANT
PAD ARMBOARD POSITIONER FOAM (MISCELLANEOUS) ×8 IMPLANT
PATCHES PATIENT (LABEL) ×12 IMPLANT
SYR 20ML ECCENTRIC (SYRINGE) ×4 IMPLANT
SYR 20ML LL LF (SYRINGE) ×4 IMPLANT
SYR 50ML SLIP (SYRINGE) ×4 IMPLANT
TOWEL GREEN STERILE FF (TOWEL DISPOSABLE) ×4 IMPLANT
TRAP SPECIMEN MUCUS 40CC (MISCELLANEOUS) IMPLANT
TUBE CONNECTING 20X1/4 (TUBING) ×4 IMPLANT
UNDERPAD 30X36 HEAVY ABSORB (UNDERPADS AND DIAPERS) ×4 IMPLANT
VALVE BIOPSY SINGLE USE (MISCELLANEOUS) ×4 IMPLANT
VALVE SUCTION BRONCHIO DISP (MISCELLANEOUS) ×4 IMPLANT
WATER STERILE IRR 1000ML POUR (IV SOLUTION) ×4 IMPLANT

## 2024-02-16 NOTE — Progress Notes (Signed)
 Occupational Therapy Treatment Patient Details Name: Bradley Ferguson MRN: [id number] DOB: [birth year] Today's Date: 02/16/2024   History of present illness 73 y.o. male admitted 02/03/24 for near syncope at pulmonologist appt. 8/1 CT with increase in size of dense masslike consolidation & pleural effusion. PMH: COPD, ILD, Afib, HTN, PAD s/p bil fem endarerectomy, HLD, raynaud's syndrome, latent tuberculosis, tobacco abuse   OT comments  Pt presented in bed and was very anxious in session and needed max redirection to complete ADLS. Pt was able to complete while sitting at EOB with set up to supervision washing hair and BUE but needed pacing and requested to go back in supine for rest breaks. Pt completed lateral stepping at EOB with RW and CGA and was left with nursing in the room. Patient will benefit from continued inpatient follow up therapy, <3 hours/day.      If plan is discharge home, recommend the following:  A little help with walking and/or transfers;A little help with bathing/dressing/bathroom;Direct supervision/assist for medications management;Assist for transportation   Equipment Recommendations  Other (comment) (TBD)    Recommendations for Other Services      Precautions / Restrictions Precautions Precautions: Fall;Other (comment) Recall of Precautions/Restrictions: Impaired Precaution/Restrictions Comments: watch sats Restrictions Weight Bearing Restrictions Per Provider Order: No       Mobility Bed Mobility Overal bed mobility: Needs Assistance Bed Mobility: Supine to Sit, Sit to Supine     Supine to sit: Supervision Sit to supine: Supervision   General bed mobility comments: HOB elevated    Transfers Overall transfer level: Needs assistance Equipment used: Rolling walker (2 wheels) Transfers: Sit to/from Stand Sit to Stand: Contact guard assist                 Balance Overall balance assessment: Needs assistance Sitting-balance support: Feet  supported Sitting balance-Leahy Scale: Fair Sitting balance - Comments: decrease in sitting tolerance   Standing balance support: Bilateral upper extremity supported Standing balance-Leahy Scale: Poor Standing balance comment: reliant on RW                           ADL either performed or assessed with clinical judgement   ADL Overall ADL's : Needs assistance/impaired Eating/Feeding: NPO   Grooming: Wash/dry hands;Wash/dry face;Sitting;Contact guard assist;Set up   Upper Body Bathing: Contact guard assist;Sitting;Set up   Lower Body Bathing: Minimal assistance;Sit to/from stand   Upper Body Dressing : Contact guard assist;Sitting   Lower Body Dressing: Minimal assistance;Sit to/from stand   Toilet Transfer: Contact guard assist;BSC/3in1;Rolling walker (2 wheels)   Toileting- Clothing Manipulation and Hygiene: Minimal assistance;Contact guard assist;Sit to/from stand              Extremity/Trunk Assessment Upper Extremity Assessment Upper Extremity Assessment: Generalized weakness   Lower Extremity Assessment Lower Extremity Assessment: Defer to PT evaluation        Vision   Vision Assessment?: No apparent visual deficits   Perception Perception Perception: Within Functional Limits   Praxis Praxis Praxis: WFL   Communication Communication Communication: No apparent difficulties Factors Affecting Communication: Difficulty expressing self   Cognition Arousal: Alert Behavior During Therapy: WFL for tasks assessed/performed Cognition: Cognition impaired     Awareness: Online awareness impaired   Attention impairment (select first level of impairment): Alternating attention Executive functioning impairment (select all impairments): Problem solving OT - Cognition Comments: Pt noted to have cues on pacing self and focusing on one task at a time  Following commands: Intact        Cueing   Cueing Techniques: Verbal cues,  Gestural cues, Tactile cues  Exercises      Shoulder Instructions       General Comments      Pertinent Vitals/ Pain       Pain Assessment Pain Assessment: No/denies pain  Home Living                                          Prior Functioning/Environment              Frequency  Min 2X/week        Progress Toward Goals  OT Goals(current goals can now be found in the care plan section)  Progress towards OT goals: Progressing toward goals  Acute Rehab OT Goals Patient Stated Goal: to get my procedure OT Goal Formulation: With patient Time For Goal Achievement: 02/19/24 Potential to Achieve Goals: Good ADL Goals Pt Will Perform Grooming: with supervision;standing Pt Will Perform Lower Body Dressing: with supervision;sit to/from stand Pt Will Transfer to Toilet: with supervision;ambulating;regular height toilet Pt Will Perform Toileting - Clothing Manipulation and hygiene: with supervision;sit to/from stand  Plan      Co-evaluation                 AM-PAC OT 6 Clicks Daily Activity     Outcome Measure   Help from another person eating meals?: None Help from another person taking care of personal grooming?: A Little Help from another person toileting, which includes using toliet, bedpan, or urinal?: A Little Help from another person bathing (including washing, rinsing, drying)?: A Little Help from another person to put on and taking off regular upper body clothing?: A Little Help from another person to put on and taking off regular lower body clothing?: A Little 6 Click Score: 19    End of Session Equipment Utilized During Treatment: Gait belt;Rolling walker (2 wheels)  OT Visit Diagnosis: Unsteadiness on feet (R26.81);Other abnormalities of gait and mobility (R26.89);Repeated falls (R29.6);Muscle weakness (generalized) (M62.81);History of falling (Z91.81)   Activity Tolerance Patient limited by fatigue   Patient Left in bed;with  call bell/phone within reach;with nursing/sitter in room (nursing working with pt when leaving the room)   Nurse Communication Mobility status        Time: 0820-0859 OT Time Calculation (min): 39 min  Charges: OT General Charges $OT Visit: 1 Visit OT Treatments $Self Care/Home Management : 38-52 mins  Warrick POUR OTR/L  Acute Rehab Services  [phone] office number   Warrick Berber 02/16/2024, 9:07 AM

## 2024-02-16 NOTE — Op Note (Signed)
 Video Bronchoscopy with Endobronchial Ultrasound Procedure Note  Date of Operation: 02/16/2024  Pre-op Diagnosis: Left upper lobe mass, right lower lobe nodule, mediastinal and hilar adenopathy  Post-op Diagnosis: Left upper lobe airway endobronchial lesion, adenopathy  Surgeon: LAMAR CHRIS  Assistants: None  Anesthesia: General endotracheal anesthesia  Operation: Flexible video fiberoptic bronchoscopy with endobronchial ultrasound and biopsies.  Estimated Blood Loss: 25 cc  Complications: None apparent  Indications and History: Bradley Ferguson is a 73 y.o. male with history of latent TB, COPD, former tobacco, A-fib on anticoagulation.  Found to have a partially cavitating left upper lobe mass, smaller left lower lobe nodule in the superior segment, widespread mediastinal hilar adenopathy.  Recommendation made to achieve a tissue diagnosis via endobronchial ultrasound with biopsies.  The risks, benefits, complications, treatment options and expected outcomes were discussed with the patient.  The possibilities of pneumothorax, pneumonia, reaction to medication, pulmonary aspiration, perforation of a viscus, bleeding, failure to diagnose a condition and creating a complication requiring transfusion or operation were discussed with the patient who freely signed the consent.    Description of Procedure: The patient was examined in the preoperative area and history and data from the preprocedure consultation were reviewed. It was deemed appropriate to proceed.  The patient was taken to [facility] room 3, identified as Alm VEAR Bellman and the procedure verified as Flexible Video Fiberoptic Bronchoscopy.  A Time Out was held and the above information confirmed. After being taken to the operating room general anesthesia was initiated and the patient  was orally intubated. The video fiberoptic bronchoscope was introduced via the endotracheal tube and a general inspection was performed which  showed normal airways on the right.  The left upper lobe carina and left upper lobe airways were markedly abnormal with a raised lobulated hypopigmented vascular endobronchial lesion and 80-90% airway occlusion.  The lingular airway was largely spared.  The left lower lobe airways appeared to be normal.  Endobronchial brushings and endobronchial forceps biopsies were performed on the left upper lobe mass lesion.  Under fluoroscopic guidance transbronchial brushings and forceps biopsies were performed in the left upper lobe.  The samples were pooled with the endobronchial samples.  A left upper lobe BAL was performed for microbiology with 60 cc of normal saline instilled and approximately 25 cc returned.  The standard scope was then withdrawn and the endobronchial ultrasound was used to identify and characterize the peritracheal, hilar and bronchial lymph nodes. Inspection showed widespread enlargement and heterogeneous echogenicity. Using real-time ultrasound guidance Wang needle biopsies were take from Station 4L, 7, 10L, 11L nodes and left upper lobe mass and were sent for cytology. The patient tolerated the procedure well without apparent complications. There was no significant blood loss. The bronchoscope was withdrawn. Anesthesia was reversed and the patient was taken to the PACU for recovery.   Samples: 1. Wang needle biopsies from 4L node 2. Wang needle biopsies from 7 node 3. Wang needle biopsies from 10 L node 4. Wang needle biopsies from 11L node 5. Wang needle biopsies from left upper lobe mass lesion 6.  Endobronchial biopsies and transbronchial biopsies from left upper lobe mass 7.  Endobronchial brushings and transbronchial brushings from left upper lobe mass 8.  Bronchoalveolar lavage from the left upper lobe  Plans:  The patient will be transferred back to his hospital room from the PACU after he is recovered anesthesia and his chest x-ray is reviewed.  We will follow his cytology,  pathology and microbiology results  with you.  Moriah Shawley S. 02/16/2024

## 2024-02-16 NOTE — Plan of Care (Signed)
   Problem: Education: Goal: Knowledge of General Education information will improve Description Including pain rating scale, medication(s)/side effects and non-pharmacologic comfort measures Outcome: Progressing   Problem: Health Behavior/Discharge Planning: Goal: Ability to manage health-related needs will improve Outcome: Progressing

## 2024-02-16 NOTE — Anesthesia Procedure Notes (Cosign Needed Addendum)
 Procedure Name: Intubation Date/Time: 02/16/2024 2:37 PM  Performed by: Merla Almarie HERO, DOPre-anesthesia Checklist: Patient identified, Emergency Drugs available, Suction available and Patient being monitored Patient Re-evaluated:Patient Re-evaluated prior to induction Oxygen Delivery Method: Circle system utilized Preoxygenation: Pre-oxygenation with 100% oxygen Induction Type: IV induction Ventilation: Two handed mask ventilation required and Oral airway inserted - appropriate to patient size Laryngoscope Size: Mac and 4 Grade View: Grade I Tube type: Oral Tube size: 8.5 mm Number of attempts: 1 Airway Equipment and Method: Stylet and Oral airway Placement Confirmation: ETT inserted through vocal cords under direct vision, positive ETCO2 and breath sounds checked- equal and bilateral Secured at: 22 cm Tube secured with: Tape Dental Injury: Teeth and Oropharynx as per pre-operative assessment

## 2024-02-16 NOTE — Progress Notes (Signed)
 PHARMACY - ANTICOAGULATION  Pharmacy Consult for heparin  Indication: atrial fibrillation   Allergies  Allergen Reactions   Cymbalta  [Duloxetine  Hcl]     unknown   Effexor [Venlafaxine]     Feels bad   Wellbutrin  [Bupropion ]     sleepy   Zoloft [Sertraline Hcl]     Felt bad    Patient Measurements: Height: 6' 0.99 (185.4 cm) Weight: 71 kg (156 lb 8.4 oz) IBW/kg (Calculated) : 79.88 HEPARIN  DW (KG): 69.8  Vital Signs: Temp: 98.1 F (36.7 C) (08/19 1700) Temp Source: Temporal (08/19 1323) BP: 113/81 (08/19 1700) Pulse Rate: 93 (08/19 1700)  Labs: Recent Labs    02/14/24 0253 02/14/24 1738 02/14/24 2236 02/15/24 0421 02/16/24 0349  HGB 8.6*  --   --  8.2* 7.9*  HCT 27.1*  --   --  25.6* 24.8*  PLT 300  --   --  314 293  HEPARINUNFRC 0.18*   < > 0.41 0.31 0.21*  CREATININE 0.87  --   --  0.90 1.01   < > = values in this interval not displayed.    Estimated Creatinine Clearance: 66.4 mL/min (by C-G formula based on SCr of 1.01 mg/dL).   Assessment: 73 y.o. male with h/o Afib, PTA Xarelto  on hold (patient reported not taking anymore but fill history shows recent fills). Pharmacy has been consulted to dose IV heparin .  S/p biopsy 8/19 afternoon. Per Dr .Shelah, okay to resume heparin  in 8 hours (0000 8/20).   Goal of Therapy:  Heparin  level 0.3-0.7 units/ml Monitor platelets by anticoagulation protocol: Yes   Plan:  At 0000, resume heparin  infusion 1850 units/hr Will f/u 8 hr heparin  level F/u when able to restart Xarelto   Thank you for involving pharmacy in this patient's care.  Vito Ralph, PharmD, BCPS Please see amion for complete clinical pharmacist phone list 02/16/2024 5:16 PM

## 2024-02-16 NOTE — Progress Notes (Signed)
 PROGRESS NOTE  Bradley Ferguson FMW:[id number] DOB: Jul 05, 1950 DOA: 02/03/2024 PCP: Sebastian Beverley NOVAK, MD   LOS: 13 days   Brief Narrative / Interim history: 73 y.o. male with medical history significant of patient with history of COPD, interstitial lung disease, patient has latent tuberculosis, atrial fibrillation on Tikosyn  and Xarelto , peripheral arterial disease, essential hypertension, anxiety disorder depression, history of hematuria with lung consolidation was on antibiotics by pulmonary.  Patient went to see pulmonary today due to near syncope and productive cough.  He was found to have significant dyspnea and tachycardia with heart rate in the 120s.  Patient noted to have oxygen saturation 90% on room air.  He was however noted to have hypercalcemia, leukocytosis with left shift and lactic acidosis.   Subjective / 24h Interval events: Complains of being thirsty this morning, he is n.p.o. for bronchoscopy later this afternoon  Assesement and Plan: Principal Problem:   Severe sepsis (HCC) Active Problems:   ANXIETY DEPRESSION   Bipolar I disorder (HCC)   Atrial fibrillation (HCC)   Peripheral arterial disease (HCC)   Chronic obstructive pulmonary disease (HCC)   Interstitial pulmonary disease (HCC)   TB lung, latent   Essential hypertension   Hypercalcemia  Principal problem Acute hypoxic respiratory failure in the setting of left upper lobe consolidation -CT scan on 8/1 showed slightly increase of the left upper lobe dense consolidation, concerning for infection versus malignancy.  He has been having worsening shortness of breath, cough, hemoptysis on presentation.  Pulmonary consulted and following.  Legionella negative. Histoplasma antigen negative. Fungitell beta D glucan negative. Blastomyces antigen negative as well - Given history of latent TB, he underwent AFB, now negative x 3 - Has been placed on Zosyn , continue.  Appreciate pulmonary follow-up, - Fluid cytology pending  this morning.  He is scheduled to get a bronchoscopy this afternoon  Active problems Concern for primary lung cancer -given left upper lobe mass, significant hypercalcemia there is a good likelihood of malignancy.  CT abdomen pelvis with contrast did show a left lateral psoas nodule concerning for metastasis - Postthoracentesis on Wednesday, cytology pending.  Will get bronchoscopy today, awaiting report and results.  If cytology and bronchoscopy is negative for malignancy, patient may need IR consult of the left lateral psoas nodule   Hypercalcemia of malignancy-over 15 on presentation.  PTH appropriately low, PTH RP pending.  Received fluids, Lasix , calcium  now is better, 8.8 this morning.  Monitor moving forward  Acute kidney injury-resolved, creatinine back to baseline  Hypomagnesemia-replenishing magnesium , give a dose of potassium to keep above 2 and 4 respectively, given the fact that he is on Tikosyn   Pulmonary fibrosis, emphysema-contributing to his dyspnea  Persistent A-fib-continue dofetilide , on heparin   Concern for severe sepsis-sepsis physiology improved.  Likely due to his possible upper lobe consolidation.  Continue antibiotics  Bipolar 1 disorder-continue home regimen   Scheduled Meds:  budesonide -glycopyrrolate -formoterol   2 puff Inhalation BID   cholecalciferol   10,000 Units Oral QPM   dofetilide   500 mcg Oral BID   gabapentin   300 mg Oral TID   polyethylene glycol  17 g Oral Daily   potassium chloride   40 mEq Oral Once   pyridOXINE   100 mg Oral QPM   Continuous Infusions:  sodium chloride  75 mL/hr at 02/15/24 1646   heparin  Stopped (02/16/24 0747)   magnesium  sulfate bolus IVPB     piperacillin -tazobactam 3.375 g (02/16/24 0645)   PRN Meds:.albuterol , bisacodyl , bisacodyl , guaiFENesin -dextromethorphan , HYDROcodone -acetaminophen , ketoconazole , mouth rinse, triamcinolone  cream  Current Outpatient  Medications  Medication Instructions   albuterol  (PROVENTIL )  2.5 mg, Nebulization, Every 6 hours PRN   albuterol  (VENTOLIN  HFA) 108 (90 Base) MCG/ACT inhaler Use  2 inhalations  15 minutes apart  every 4 hours  to rescue Asthma Attack   atorvastatin  (LIPITOR ) 80 MG tablet Take 1 tablet Daily for Cholesterol   budesonide -glycopyrrolate -formoterol  (BREZTRI  AEROSPHERE) 160-9-4.8 MCG/ACT AERO inhaler 2 puffs, Inhalation, 2 times daily   Cholecalciferol  10,000 Units, Every evening   dofetilide  (TIKOSYN ) 500 mcg, Oral, 2 times daily   gabapentin  (NEURONTIN ) 300 MG capsule Take 1 capsule 3 x /day for Neuropathy Pain   ketoconazole  (NIZORAL ) 2 % cream 1 Application, Daily PRN   oxyCODONE  (OXY IR/ROXICODONE ) 5 mg, Every 4 hours PRN   pyridOXINE  (VITAMIN B6) 100 mg, Every evening   rivaroxaban  (XARELTO ) 20 mg, Oral, Daily with supper   triamcinolone  cream (KENALOG ) 0.5 % 1 Application, Daily PRN    Diet Orders (From admission, onward)     Start     Ordered   02/16/24 0646  Diet NPO time specified Except for: Sips with Meds  Diet effective now       Question:  Except for  Answer:  Noralyn with Meds   02/16/24 0646            DVT prophylaxis:    Lab Results  Component Value Date   PLT 293 02/16/2024      Code Status: Full Code  Family Communication: no family at bedside   Status is: Inpatient Remains inpatient appropriate because: Severity of illness   Level of care: Telemetry Cardiac  Consultants:  PCCM  Objective: Vitals:   02/15/24 2358 02/16/24 0458 02/16/24 0752 02/16/24 0829  BP: 117/70 121/68  118/70  Pulse: 100 82  94  Resp: 18 18  18   Temp: 98.6 F (37 C) 98.1 F (36.7 C)  98.4 F (36.9 C)  TempSrc: Oral Oral  Oral  SpO2: 93% 94% 96% 94%  Weight:  71 kg    Height:        Intake/Output Summary (Last 24 hours) at 02/16/2024 1002 Last data filed at 02/16/2024 0800 Gross per 24 hour  Intake 1401.14 ml  Output 1750 ml  Net -348.86 ml   Wt Readings from Last 3 Encounters:  02/16/24 71 kg  01/21/24 68.9 kg  01/19/24  68 kg    Examination:  Constitutional: NAD Eyes: lids and conjunctivae normal, no scleral icterus ENMT: mmm Neck: normal, supple Respiratory: clear to auscultation bilaterally, no wheezing, no crackles. Normal respiratory effort.  Cardiovascular: Regular rate and rhythm, no murmurs / rubs / gallops. No LE edema. Abdomen: soft, no distention, no tenderness. Bowel sounds positive.    Data Reviewed: I have independently reviewed following labs and imaging studies   CBC Recent Labs  Lab 02/12/24 0301 02/13/24 0255 02/14/24 0253 02/15/24 0421 02/16/24 0349  WBC 22.7* 22.4* 21.3* 22.9* 21.1*  HGB 9.0* 8.8* 8.6* 8.2* 7.9*  HCT 28.0* 28.2* 27.1* 25.6* 24.8*  PLT 301 286 300 314 293  MCV 80.2 81.5 80.7 80.5 80.8  MCH 25.8* 25.4* 25.6* 25.8* 25.7*  MCHC 32.1 31.2 31.7 32.0 31.9  RDW 19.4* 19.9* 19.9* 20.1* 20.6*    Recent Labs  Lab 02/11/24 0259 02/12/24 0301 02/13/24 0255 02/14/24 0253 02/15/24 0421 02/16/24 0349  NA 136 134* 134* 134* 134* 136  K 4.8 4.3 4.8 4.4 4.3 3.7  CL 105 99 105 106 106 108  CO2 26 24 21* 20* 19* 20*  GLUCOSE 81  103* 92 110* 106* 120*  BUN 19 16 11  6* 6* 7*  CREATININE 1.07 1.10 0.92 0.87 0.90 1.01  CALCIUM  8.0* 9.1 8.7* 8.8* 8.4* 8.8*  AST 24  --   --  35 29 30  ALT 16  --   --  23 22 20   ALKPHOS 86  --   --  112 108 115  BILITOT 0.4  --   --  0.5 0.3 <0.2  ALBUMIN  1.7*  --   --  1.8* 1.7* 1.5*  MG 1.5* 2.4  --  1.5* 2.0 1.6*    ------------------------------------------------------------------------------------------------------------------ No results for input(s): CHOL, HDL, LDLCALC, TRIG, CHOLHDL, LDLDIRECT in the last 72 hours.  Lab Results  Component Value Date   HGBA1C 5.9 (H) 07/06/2023   ------------------------------------------------------------------------------------------------------------------ No results for input(s): TSH, T4TOTAL, T3FREE, THYROIDAB in the last 72 hours.  Invalid input(s):  FREET3  Cardiac Enzymes No results for input(s): CKMB, TROPONINI, MYOGLOBIN in the last 168 hours.  Invalid input(s): CK ------------------------------------------------------------------------------------------------------------------ No results found for: BNP  CBG: Recent Labs  Lab 02/10/24 1620  GLUCAP 107*    Recent Results (from the past 240 hours)  MRSA Next Gen by PCR, Nasal     Status: None   Collection Time: 02/08/24  3:42 PM   Specimen: Nasal Mucosa; Nasal Swab  Result Value Ref Range Status   MRSA by PCR Next Gen NOT DETECTED NOT DETECTED Final    Comment: (NOTE) The GeneXpert MRSA Assay (FDA approved for NASAL specimens only), is one component of a comprehensive MRSA colonization surveillance program. It is not intended to diagnose MRSA infection nor to guide or monitor treatment for MRSA infections. Test performance is not FDA approved in patients less than 45 years old. Performed at [facility], [address] [place], [place] [address]   Blastomyces Antigen     Status: None   Collection Time: 02/08/24  4:50 PM   Specimen: Blood  Result Value Ref Range Status   Blastomyces Antigen None Detected None Detected ng/mL Final    Comment: (NOTE) Reference Interval: None Detected Reportable Range: 0.31 ng/mL - 20.00 ng/mL Results above 20.00 ng/mL are reported as 'Positive, Above the Limit of Quantification' This test was developed and its performance characteristics determined by [employer]. It has not been cleared or approved by the FDA; however, FDA clearance or approval is not currently required for clinical use. The results are not intended to be used as the sole means for clinical diagnosis or patient decisions.    Interpretation Negative  Final   Specimen Type SERUM  Final    Comment: (NOTE) Performed At: [facility] [address] [place], [place] [id number] [place] MD Ey:[id number]   Body fluid  culture w Gram Stain     Status: None   Collection Time: 02/10/24  1:32 PM   Specimen: Pleural Fluid  Result Value Ref Range Status   Specimen Description PLEURAL  Final   Special Requests NONE  Final   Gram Stain   Final    WBC PRESENT, PREDOMINANTLY MONONUCLEAR NO ORGANISMS SEEN CYTOSPIN SMEAR    Culture   Final    NO GROWTH 3 DAYS Performed at [facility], [address] [place], [place] [address]    Report Status 02/13/2024 FINAL  Final     Radiology Studies: DG Chest Port 1 View Result Date: 02/15/2024 CLINICAL DATA:  Status post bronchoscopy with biopsy. EXAM: PORTABLE CHEST 1 VIEW COMPARISON:  None Available.  FINDINGS: No pneumothorax following bronchoscopy. The previously demonstrated cavitary consolidation in the left upper lobe has not changed significantly, partially obscured by interval increased density more medially. Small to moderate-sized left pleural effusion with a mild increase in size. Mild increase in left basilar atelectasis. Mildly increased prominence of the interstitial markings with Kerley lines. Mildly increased pulmonary vasculature on the right. No right pleural fluid seen. Borderline enlarged cardiac silhouette. Aortic arch calcifications. Unremarkable bones. IMPRESSION: 1. No pneumothorax. 2. No significant change in the previously demonstrated cavitary consolidation in the left upper lobe. 3. Mild increase in size of a small to moderate-sized left pleural effusion. 4. Mildly increased left basilar atelectasis. 5. Mild changes of acute CHF. 6. Borderline cardiomegaly. Electronically Signed   By: Elspeth Bathe M.D.   On: 02/15/2024 11:15      Nilda Fendt, MD, PhD [facility]  Between 7 am - 7 pm I am available, please contact me via Amion (for emergencies) or Securechat (non urgent messages)  Between 7 pm - 7 am I am not available, please contact night coverage MD/APP via Amion

## 2024-02-16 NOTE — Transfer of Care (Cosign Needed)
 Immediate Anesthesia Transfer of Care Note  Patient: DEMAURION DICIOCCIO  Procedure(s) Performed: ENDOBRONCHIAL ULTRASOUND (EBUS) BRONCHOSCOPY, FLEXIBLE BRONCHOSCOPY, WITH BIOPSY BRONCHOSCOPY, WITH NEEDLE ASPIRATION BIOPSY IRRIGATION, BRONCHUS  Patient Location: PACU  Anesthesia Type:General  Level of Consciousness: awake, drowsy, and patient cooperative  Airway & Oxygen Therapy: Patient Spontanous Breathing and Patient connected to face mask oxygen  Post-op Assessment: Report given to RN and Post -op Vital signs reviewed and stable  Post vital signs: Reviewed and stable  Last Vitals:  Vitals Value Taken Time  BP 121/79 02/16/24 16:17  Temp 98.3   Pulse 112 02/16/24 16:24  Resp 25 02/16/24 16:24  SpO2 92 % 02/16/24 16:24  Vitals shown include unfiled device data.  Last Pain:  Vitals:   02/16/24 1323  TempSrc: Temporal  PainSc: 8       Patients Stated Pain Goal: 0 (02/16/24 0750)  Complications: No notable events documented.

## 2024-02-16 NOTE — Interval H&P Note (Signed)
 History and Physical Interval Note:  02/16/2024 2:20 PM  Bradley Ferguson  has presented today for surgery, with the diagnosis of lung mass.  The various methods of treatment have been discussed with the patient and family. After consideration of risks, benefits and other options for treatment, the patient has consented to  Procedure(s) with comments: VIDEO BRONCHOSCOPY WITH ENDOBRONCHIAL NAVIGATION (Left) - w EBUS staging. ENDOBRONCHIAL ULTRASOUND (EBUS) (N/A) as a surgical intervention.  The patient's history has been reviewed, patient examined, no change in status, stable for surgery.  I have reviewed the patient's chart and labs.  Questions were answered to the patient's satisfaction.     [name]

## 2024-02-16 NOTE — Anesthesia Postprocedure Evaluation (Signed)
 Anesthesia Post Note  Patient: Bradley Ferguson  Procedure(s) Performed: ENDOBRONCHIAL ULTRASOUND (EBUS) BRONCHOSCOPY, FLEXIBLE BRONCHOSCOPY, WITH BIOPSY BRONCHOSCOPY, WITH NEEDLE ASPIRATION BIOPSY IRRIGATION, BRONCHUS     Patient location during evaluation: PACU Anesthesia Type: General Level of consciousness: awake and alert Pain management: pain level controlled Vital Signs Assessment: post-procedure vital signs reviewed and stable Respiratory status: spontaneous breathing, nonlabored ventilation, respiratory function stable and patient connected to nasal cannula oxygen Cardiovascular status: blood pressure returned to baseline and stable Postop Assessment: no apparent nausea or vomiting Anesthetic complications: no   No notable events documented.  Last Vitals:  Vitals:   02/16/24 1645 02/16/24 1700  BP: 118/72 113/81  Pulse: 96 93  Resp: (!) 22 17  Temp:  36.7 C  SpO2: 94% 95%    Last Pain:  Vitals:   02/16/24 1645  TempSrc:   PainSc: 0-No pain                 Thom JONELLE Peoples

## 2024-02-16 NOTE — Anesthesia Preprocedure Evaluation (Addendum)
 Anesthesia Evaluation  Patient identified by MRN, date of birth, ID band Patient awake    Reviewed: Allergy & Precautions, NPO status , Patient's Chart, lab work & pertinent test results  History of Anesthesia Complications Negative for: history of anesthetic complications  Airway Mallampati: II  TM Distance: >3 FB Neck ROM: Full   Comment: Previous grade II view with MAC 4, easy mask Dental  (+) Edentulous Upper, Edentulous Lower, Dental Advisory Given   Pulmonary shortness of breath, neg sleep apnea, COPD,  COPD inhaler, Current Smoker and Patient abstained from smoking. Latent TB, interstitial pulmonary disease   Pulmonary exam normal breath sounds clear to auscultation       Cardiovascular hypertension, (-) angina + CAD, + Peripheral Vascular Disease and + DOE  (-) Past MI, (-) Cardiac Stents and (-) CABG + dysrhythmias (RBBB) Atrial Fibrillation + Valvular Problems/Murmurs  Rhythm:Irregular Rate:Normal  HLD  Low-risk stress test 07/24/2023  TTE 03/08/2020: IMPRESSIONS     1. Left ventricular ejection fraction, by estimation, is 65 to 70%. The  left ventricle has hyperdynamic function. The left ventricle has no  regional wall motion abnormalities. There is mild left ventricular  hypertrophy. Left ventricular diastolic  parameters are consistent with Grade II diastolic dysfunction  (pseudonormalization).   2. Right ventricular systolic function is normal. The right ventricular  size is normal. There is normal pulmonary artery systolic pressure. The  estimated right ventricular systolic pressure is 26.4 mmHg.   3. Left atrial size was mildly dilated.   4. Chiari network noted. Right atrial size was mildly dilated.   5. The mitral valve is normal in structure. No evidence of mitral valve  regurgitation. No evidence of mitral stenosis. Moderate mitral annular  calcification.   6. The aortic valve is tricuspid. Aortic valve  regurgitation is not  visualized. Mild aortic valve sclerosis is present, with no evidence of  aortic valve stenosis.   7. The inferior vena cava is normal in size with greater than 50%  respiratory variability, suggesting right atrial pressure of 3 mmHg.     Neuro/Psych neg Seizures PSYCHIATRIC DISORDERS (ADD) Anxiety Depression Bipolar Disorder   negative neurological ROS     GI/Hepatic negative GI ROS,,,Hepatic steatosis    Endo/Other  negative endocrine ROS    Renal/GU negative Renal ROS     Musculoskeletal   Abdominal   Peds  Hematology  (+) Blood dyscrasia, anemia Lab Results      Component                Value               Date                      WBC                      21.1 (H)            02/16/2024                HGB                      7.9 (L)             02/16/2024                HCT                      24.8 (L)  02/16/2024                MCV                      80.8                02/16/2024                PLT                      293                 02/16/2024              Anesthesia Other Findings   Reproductive/Obstetrics                              Anesthesia Physical Anesthesia Plan  ASA: 4  Anesthesia Plan: General   Post-op Pain Management: Tylenol  PO (pre-op)*   Induction: Intravenous  PONV Risk Score and Plan: 1 and Ondansetron , Dexamethasone , Propofol  infusion, TIVA and Treatment may vary due to age or medical condition  Airway Management Planned: Oral ETT  Additional Equipment:   Intra-op Plan:   Post-operative Plan: Extubation in OR  Informed Consent: I have reviewed the patients History and Physical, chart, labs and discussed the procedure including the risks, benefits and alternatives for the proposed anesthesia with the patient or authorized representative who has indicated his/her understanding and acceptance.     Dental advisory given  Plan Discussed with: CRNA and  Anesthesiologist  Anesthesia Plan Comments: (Risks of general anesthesia discussed including, but not limited to, sore throat, hoarse voice, chipped/damaged teeth, injury to vocal cords, nausea and vomiting, allergic reactions, lung infection, heart attack, stroke, and death. All questions answered. )         Anesthesia Quick Evaluation

## 2024-02-17 ENCOUNTER — Encounter (HOSPITAL_COMMUNITY): Payer: Self-pay | Admitting: Emergency Medicine

## 2024-02-17 DIAGNOSIS — R918 Other nonspecific abnormal finding of lung field: Secondary | ICD-10-CM | POA: Diagnosis not present

## 2024-02-17 LAB — BASIC METABOLIC PANEL WITH GFR
Anion gap: 12 (ref 5–15)
BUN: 12 mg/dL (ref 8–23)
CO2: 20 mmol/L — ABNORMAL LOW (ref 22–32)
Calcium: 9.2 mg/dL (ref 8.9–10.3)
Chloride: 105 mmol/L (ref 98–111)
Creatinine, Ser: 0.91 mg/dL (ref 0.61–1.24)
GFR, Estimated: >60 mL/min (ref >60)
Glucose, Bld: 129 mg/dL — ABNORMAL HIGH (ref 70–99)
Potassium: 4.9 mmol/L (ref 3.5–5.1)
Sodium: 137 mmol/L (ref 135–145)

## 2024-02-17 LAB — HEPARIN LEVEL (UNFRACTIONATED): Heparin Unfractionated: 0.4 [IU]/mL (ref 0.30–0.70)

## 2024-02-17 LAB — CBC
HCT: 27.2 % — ABNORMAL LOW (ref 39.0–52.0)
Hemoglobin: 8.6 g/dL — ABNORMAL LOW (ref 13.0–17.0)
MCH: 25.7 pg — ABNORMAL LOW (ref 26.0–34.0)
MCHC: 31.6 g/dL (ref 30.0–36.0)
MCV: 81.2 fL (ref 80.0–100.0)
Platelets: 368 K/uL (ref 150–400)
RBC: 3.35 MIL/uL — ABNORMAL LOW (ref 4.22–5.81)
RDW: 20.9 % — ABNORMAL HIGH (ref 11.5–15.5)
WBC: 33.3 K/uL — ABNORMAL HIGH (ref 4.0–10.5)
nRBC: 0 % (ref 0.0–0.2)

## 2024-02-17 LAB — MAGNESIUM: Magnesium: 2.2 mg/dL (ref 1.7–2.4)

## 2024-02-17 MED ORDER — GUAIFENESIN-DM 100-10 MG/5ML PO SYRP: 5.0000 mL | ORAL_SOLUTION | Freq: Once | ORAL | Status: DC

## 2024-02-17 NOTE — TOC Progression Note (Signed)
 Transition of Care [facility]) - Progression Note    Patient Details  Name: Bradley Ferguson MRN: [id number] Date of Birth: [birth year]  [facility] CM/SW Contact  [name], [place] Phone Number: 02/17/2024, 11:29 AM  Clinical Narrative:   CSW met patient at bedside to confirm he still wants to go to SNF for STR and that he still would like to go to [facility]. Patient confirmed.  Per daily meeting with treatment team, awaiting results from bronch procedure yesterday 8/19.   CSW will continue to follow.    Expected Discharge Plan: Skilled Nursing Facility Barriers to Discharge: Continued Medical Work up, SNF Pending bed offer, Insurance Authorization               Expected Discharge Plan and Services In-house Referral: Clinical Social Work     Living arrangements for the past 2 months: Single Family Home                                       Social Drivers of Health (SDOH) Interventions SDOH Screenings   Food Insecurity: Food Insecurity Present (02/03/2024)  Housing: Low Risk  (02/03/2024)  Transportation Needs: No Transportation Needs (02/03/2024)  Utilities: Not At Risk (02/03/2024)  Alcohol Screen: Low Risk  (12/29/2023)  Depression (PHQ2-9): Medium Risk (11/13/2023)  Financial Resource Strain: Medium Risk (12/29/2023)  Physical Activity: Inactive (12/29/2023)  Social Connections: Socially Isolated (02/03/2024)  Stress: Stress Concern Present (12/29/2023)  Tobacco Use: High Risk (02/16/2024)  Health Literacy: Adequate Health Literacy (11/13/2023)    Readmission Risk Interventions    02/04/2024    3:23 PM 11/10/2023    2:54 PM  Readmission Risk Prevention Plan  Post Dischage Appt  Complete  Medication Screening  Complete  Transportation Screening Complete Complete  PCP or Specialist Appt within 5-7 Days Complete   Home Care Screening Complete   Medication Review (RN CM) Complete

## 2024-02-17 NOTE — Progress Notes (Signed)
 PHARMACY - ANTICOAGULATION CONSULT NOTE  Pharmacy Consult for Heparin  Indication: atrial fibrillation  Allergies  Allergen Reactions   Cymbalta  [Duloxetine  Hcl]     unknown   Effexor [Venlafaxine]     Feels bad   Wellbutrin  [Bupropion ]     sleepy   Zoloft [Sertraline Hcl]     Felt bad    Patient Measurements: Height: 6' 0.99 (185.4 cm) Weight: 71 kg (156 lb 8.4 oz) IBW/kg (Calculated) : 79.88 HEPARIN  DW (KG): 69.8  Vital Signs: Temp: 97.5 F (36.4 C) (08/20 1115) Temp Source: Oral (08/20 1115) BP: 128/76 (08/20 1115) Pulse Rate: 79 (08/20 0750)  Labs: Recent Labs    02/15/24 0421 02/16/24 0349 02/17/24 0626 02/17/24 0832  HGB 8.2* 7.9* 8.6*  --   HCT 25.6* 24.8* 27.2*  --   PLT 314 293 368  --   HEPARINUNFRC 0.31 0.21*  --  0.40  CREATININE 0.90 1.01 0.91  --     Estimated Creatinine Clearance: 73.7 mL/min (by C-G formula based on SCr of 0.91 mg/dL).  Assessment: 73 y.o. male with h/o Afib, PTA Xarelto  on hold (patient reported not taking anymore but fill history shows recent fills). Pharmacy has been consulted to dose IV heparin .   S/p bronch and biopsy 8/19 afternoon. Per Dr .Shelah, okay to resume heparin  in 8 hours (0000 8/20).   Heparin  level is therapeutic (0.40) on 1850 units/hr. CBC stable.   Goal of Therapy:  Heparin  level 0.3-0.7 units/ml Monitor platelets by anticoagulation protocol: Yes   Plan:  Continue heparin  drip at 1850 untis/hr Daily heparin  level and CBC. Monitor for signs/symptoms of bleeding. F/u resuming Xarelto  when able.  Genaro Zebedee Calin, RPh 02/17/2024,2:01 PM

## 2024-02-17 NOTE — Progress Notes (Signed)
 PROGRESS NOTE    Bradley Ferguson  FMW:[id number] DOB: 01-Mar-1951 DOA: 02/03/2024 PCP: Sebastian Beverley NOVAK, MD   Brief Narrative:    73 y.o. male with medical history significant of patient with history of COPD, interstitial lung disease, patient has latent tuberculosis, atrial fibrillation on Tikosyn  and Xarelto , peripheral arterial disease, essential hypertension, anxiety disorder depression, history of hematuria with lung consolidation was on antibiotics by pulmonary.  Patient went to see pulmonary today due to near syncope and productive cough.  He was found to have significant dyspnea and tachycardia with heart rate in the 120s.  Patient noted to have oxygen saturation 90% on room air.  He was however noted to have hypercalcemia, leukocytosis with left shift and lactic acidosis.  He was admitted for acute hypoxemic respiratory failure in the setting of left upper lobe consolidation and underwent bronchoscopy on 8/19.  Now noted to have worsening leukocytosis and remains on IV antibiotics with further cultures pending.  Assessment & Plan:   Principal Problem:   Mass of upper lobe of left lung Active Problems:   ANXIETY DEPRESSION   Bipolar I disorder (HCC)   Atrial fibrillation (HCC)   Peripheral arterial disease (HCC)   Chronic obstructive pulmonary disease (HCC)   Interstitial pulmonary disease (HCC)   TB lung, latent   Essential hypertension   Severe sepsis (HCC)   Hypercalcemia   Mediastinal adenopathy  Assessment and Plan:   Acute hypoxic respiratory failure in the setting of left upper lobe consolidation -CT scan on 8/1 showed slightly increase of the left upper lobe dense consolidation, concerning for infection versus malignancy.  He has been having worsening shortness of breath, cough, hemoptysis on presentation.  Pulmonary consulted and following.  Legionella negative. Histoplasma antigen negative. Fungitell beta D glucan negative. Blastomyces antigen negative as well - Given  history of latent TB, he underwent AFB, now negative x 3 - Has been placed on Zosyn , continue.  Appreciate pulmonary follow-up, - Status post bronchoscopy 8/19 with cultures pending as well as path report. -Noted to have worsening leukocytosis, but with no fever noted, monitor CBC.   Active problems Concern for primary lung cancer -given left upper lobe mass, significant hypercalcemia there is a good likelihood of malignancy.  CT abdomen pelvis with contrast did show a left lateral psoas nodule concerning for metastasis - Postthoracentesis on Wednesday, cytology pending.  Will get bronchoscopy today, awaiting report and results.  If cytology and bronchoscopy is negative for malignancy, patient may need IR consult of the left lateral psoas nodule    Hypercalcemia of malignancy-over 15 on presentation.  PTH appropriately low, PTH RP pending.  Received fluids, Lasix , calcium  now is better, 9.2 this morning.  Monitor moving forward   Acute kidney injury-resolved, creatinine back to baseline   Pulmonary fibrosis, emphysema-contributing to his dyspnea   Persistent A-fib-continue dofetilide , on heparin .  Appears to be on Xarelto  at home.   Concern for severe sepsis-sepsis physiology improved.  Likely due to his possible upper lobe consolidation.  Continue antibiotics.  Noted to have worsening leukocytosis, monitor.   Bipolar 1 disorder-continue home regimen    DVT prophylaxis: Heparin  drip Code Status: Full Family Communication: None at bedside Disposition Plan: Plan for SNF on discharge. Status is: Inpatient Remains inpatient appropriate because: Need for IV antibiotics.   Consultants:  Pulmonology  Procedures:  Bronchoscopy with biopsies on 8/19  Antimicrobials:  Anti-infectives (From admission, onward)    Start     Dose/Rate Route Frequency Ordered Stop   02/08/24  1645  piperacillin -tazobactam (ZOSYN ) IVPB 3.375 g       Note to Pharmacy: Please change dosing if needed.   3.375  g 12.5 mL/hr over 240 Minutes Intravenous Every 8 hours 02/08/24 1549     02/05/24 1800  doxycycline  (VIBRA -TABS) tablet 100 mg        100 mg Oral Every 12 hours 02/05/24 1428 02/09/24 1721   02/04/24 1700  azithromycin  (ZITHROMAX ) 500 mg in sodium chloride  0.9 % 250 mL IVPB  Status:  Discontinued        500 mg 250 mL/hr over 60 Minutes Intravenous Every 24 hours 02/03/24 1945 02/03/24 2145   02/04/24 1600  cefTRIAXone  (ROCEPHIN ) 1 g in sodium chloride  0.9 % 100 mL IVPB  Status:  Discontinued        1 g 200 mL/hr over 30 Minutes Intravenous Every 24 hours 02/03/24 1945 02/08/24 1549   02/04/24 1600  doxycycline  (VIBRAMYCIN ) 100 mg in sodium chloride  0.9 % 250 mL IVPB  Status:  Discontinued        100 mg 125 mL/hr over 120 Minutes Intravenous Every 12 hours 02/03/24 2145 02/05/24 1428   02/03/24 1645  cefTRIAXone  (ROCEPHIN ) 2 g in sodium chloride  0.9 % 100 mL IVPB  Status:  Discontinued        2 g 200 mL/hr over 30 Minutes Intravenous Once 02/03/24 1643 02/03/24 1643   02/03/24 1645  azithromycin  (ZITHROMAX ) 500 mg in sodium chloride  0.9 % 250 mL IVPB  Status:  Discontinued        500 mg 250 mL/hr over 60 Minutes Intravenous  Once 02/03/24 1643 02/03/24 1643   02/03/24 1615  cefTRIAXone  (ROCEPHIN ) 1 g in sodium chloride  0.9 % 100 mL IVPB        1 g 200 mL/hr over 30 Minutes Intravenous  Once 02/03/24 1604 02/03/24 1656   02/03/24 1615  azithromycin  (ZITHROMAX ) 500 mg in sodium chloride  0.9 % 250 mL IVPB        500 mg 250 mL/hr over 60 Minutes Intravenous  Once 02/03/24 1604 02/03/24 1801       Subjective: Patient seen and evaluated today with no new acute complaints or concerns. No acute concerns or events noted overnight.  Denies any chest pain or shortness of breath, but noted to have worsening leukocytosis.  Objective: Vitals:   02/17/24 0432 02/17/24 0433 02/17/24 0750 02/17/24 1115  BP:   109/63 128/76  Pulse: 93  79   Resp: 12 15 17 20   Temp:   98 F (36.7 C) (!) 97.5 F  (36.4 C)  TempSrc:   Oral Oral  SpO2: 91%  93% 91%  Weight:      Height:        Intake/Output Summary (Last 24 hours) at 02/17/2024 1203 Last data filed at 02/17/2024 1110 Gross per 24 hour  Intake 3735.61 ml  Output 1250 ml  Net 2485.61 ml   Filed Weights   02/14/24 0503 02/15/24 0615 02/16/24 0458  Weight: 69.8 kg 69.6 kg 71 kg    Examination:  General exam: Appears calm and comfortable  Respiratory system: Clear to auscultation. Respiratory effort normal.  On nasal cannula. Cardiovascular system: S1 & S2 heard, RRR.  Gastrointestinal system: Abdomen is soft Central nervous system: Alert and awake Extremities: No edema Skin: No significant lesions noted Psychiatry: Flat affect.    Data Reviewed: I have personally reviewed following labs and imaging studies  CBC: Recent Labs  Lab 02/13/24 0255 02/14/24 0253 02/15/24 0421 02/16/24 0349 02/17/24  0626  WBC 22.4* 21.3* 22.9* 21.1* 33.3*  HGB 8.8* 8.6* 8.2* 7.9* 8.6*  HCT 28.2* 27.1* 25.6* 24.8* 27.2*  MCV 81.5 80.7 80.5 80.8 81.2  PLT 286 300 314 293 368   Basic Metabolic Panel: Recent Labs  Lab 02/12/24 0301 02/13/24 0255 02/14/24 0253 02/15/24 0421 02/16/24 0349 02/17/24 0626  NA 134* 134* 134* 134* 136 137  K 4.3 4.8 4.4 4.3 3.7 4.9  CL 99 105 106 106 108 105  CO2 24 21* 20* 19* 20* 20*  GLUCOSE 103* 92 110* 106* 120* 129*  BUN 16 11 6* 6* 7* 12  CREATININE 1.10 0.92 0.87 0.90 1.01 0.91  CALCIUM  9.1 8.7* 8.8* 8.4* 8.8* 9.2  MG 2.4  --  1.5* 2.0 1.6* 2.2   GFR: Estimated Creatinine Clearance: 73.7 mL/min (by C-G formula based on SCr of 0.91 mg/dL). Liver Function Tests: Recent Labs  Lab 02/11/24 0259 02/14/24 0253 02/15/24 0421 02/16/24 0349  AST 24 35 29 30  ALT 16 23 22 20   ALKPHOS 86 112 108 115  BILITOT 0.4 0.5 0.3 <0.2  PROT 4.7* 5.5* 5.3* 4.9*  ALBUMIN  1.7* 1.8* 1.7* 1.5*   No results for input(s): LIPASE, AMYLASE in the last 168 hours. No results for input(s): AMMONIA in  the last 168 hours. Coagulation Profile: No results for input(s): INR, PROTIME in the last 168 hours. Cardiac Enzymes: No results for input(s): CKTOTAL, CKMB, CKMBINDEX, TROPONINI in the last 168 hours. BNP (last 3 results) No results for input(s): PROBNP in the last 8760 hours. HbA1C: No results for input(s): HGBA1C in the last 72 hours. CBG: Recent Labs  Lab 02/10/24 1620  GLUCAP 107*   Lipid Profile: No results for input(s): CHOL, HDL, LDLCALC, TRIG, CHOLHDL, LDLDIRECT in the last 72 hours. Thyroid  Function Tests: No results for input(s): TSH, T4TOTAL, FREET4, T3FREE, THYROIDAB in the last 72 hours. Anemia Panel: No results for input(s): VITAMINB12, FOLATE, FERRITIN, TIBC, IRON, RETICCTPCT in the last 72 hours. Sepsis Labs: No results for input(s): PROCALCITON, LATICACIDVEN in the last 168 hours.  Recent Results (from the past 240 hours)  MRSA Next Gen by PCR, Nasal     Status: None   Collection Time: 02/08/24  3:42 PM   Specimen: Nasal Mucosa; Nasal Swab  Result Value Ref Range Status   MRSA by PCR Next Gen NOT DETECTED NOT DETECTED Final    Comment: (NOTE) The GeneXpert MRSA Assay (FDA approved for NASAL specimens only), is one component of a comprehensive MRSA colonization surveillance program. It is not intended to diagnose MRSA infection nor to guide or monitor treatment for MRSA infections. Test performance is not FDA approved in patients less than 44 years old. Performed at [facility], [address] [place], [place] [address]   Blastomyces Antigen     Status: None   Collection Time: 02/08/24  4:50 PM   Specimen: Blood  Result Value Ref Range Status   Blastomyces Antigen None Detected None Detected ng/mL Final    Comment: (NOTE) Reference Interval: None Detected Reportable Range: 0.31 ng/mL - 20.00 ng/mL Results above 20.00 ng/mL are reported as 'Positive, Above the Limit of Quantification' This  test was developed and its performance characteristics determined by [employer]. It has not been cleared or approved by the FDA; however, FDA clearance or approval is not currently required for clinical use. The results are not intended to be used as the sole means for clinical diagnosis or patient decisions.    Interpretation Negative  Final   Specimen Type SERUM  Final    Comment: (NOTE) Performed At: [facility] [address] [place], [place] [id number] [place] MD Ey:[id number]   Body fluid culture w Gram Stain     Status: None   Collection Time: 02/10/24  1:32 PM   Specimen: Pleural Fluid  Result Value Ref Range Status   Specimen Description PLEURAL  Final   Special Requests NONE  Final   Gram Stain   Final    WBC PRESENT, PREDOMINANTLY MONONUCLEAR NO ORGANISMS SEEN CYTOSPIN SMEAR    Culture   Final    NO GROWTH 3 DAYS Performed at [facility], [address] [place], [place] [address]    Report Status 02/13/2024 FINAL  Final  Culture, BAL-quantitative w Gram Stain     Status: None (Preliminary result)   Collection Time: 02/16/24  3:46 PM   Specimen: Bronchial Alveolar Lavage; Respiratory  Result Value Ref Range Status   Specimen Description BRONCHIAL ALVEOLAR LAVAGE  Final   Special Requests LUL  Final   Gram Stain   Final    FEW WBC PRESENT, PREDOMINANTLY PMN NO ORGANISMS SEEN Performed at [facility], [address] [place], [place] [address]    Culture PENDING  Incomplete   Report Status PENDING  Incomplete  Anaerobic culture w Gram Stain     Status: None (Preliminary result)   Collection Time: 02/16/24  3:46 PM   Specimen: Bronchoalveolar Lavage  Result Value Ref Range Status   Specimen Description BRONCHIAL ALVEOLAR LAVAGE  Final   Special Requests LUL  Final   Gram Stain   Final    FEW WBC PRESENT, PREDOMINANTLY PMN NO ORGANISMS SEEN Performed at [facility], [address], [place], [place]  [address]    Culture PENDING  Incomplete   Report Status PENDING  Incomplete         Radiology Studies: DG Chest Port 1 View Result Date: 02/16/2024 CLINICAL DATA:  Status post bronchoscopy with biopsy. EXAM: PORTABLE CHEST 1 VIEW COMPARISON:  February 15, 2024. FINDINGS: Stable cardiomediastinal silhouette. Stable left lung opacity is noted concerning for pneumonia. No pneumothorax is noted. Stable left pleural effusion with associated atelectasis or infiltrate. Minimal right basilar subsegmental atelectasis is noted. Bony thorax is unremarkable. IMPRESSION: Stable left lung opacity is noted concerning for pneumonia. Stable left pleural effusion with associated atelectasis or infiltrate. No pneumothorax is noted. Electronically Signed   By: Lynwood Landy Raddle M.D.   On: 02/16/2024 16:39   DG C-ARM BRONCHOSCOPY Result Date: 02/16/2024 C-ARM BRONCHOSCOPY: Fluoroscopy was utilized by the requesting physician.  No radiographic interpretation.        Scheduled Meds:  budesonide -glycopyrrolate -formoterol   2 puff Inhalation BID   dofetilide   500 mcg Oral BID   gabapentin   300 mg Oral TID   guaiFENesin -dextromethorphan   5 mL Oral Once   polyethylene glycol  17 g Oral Daily   pyridOXINE   100 mg Oral QPM   Continuous Infusions:  sodium chloride  75 mL/hr at 02/17/24 1124   heparin  1,850 Units/hr (02/17/24 0006)   piperacillin -tazobactam 3.375 g (02/17/24 1110)     LOS: 14 days    Time spent: 55 minutes    Machel Violante D Maree, DO [facility]  If 7PM-7AM, please contact night-coverage [email or web address] 02/17/2024, 12:03 PM

## 2024-02-17 NOTE — Progress Notes (Signed)
 NAME:  Bradley Ferguson, MRN:  [id number], DOB:  08/19/1950, LOS: 14 ADMISSION DATE:  02/03/2024, CONSULTATION DATE:  02/17/24 REFERRING MD:  Dr Maree , CHIEF COMPLAINT:  Lung mass   History of Present Illness:  73 year old male with a past medical history of limited systemic sclerosis, COPD, ILD, A-fib, latent TB who is presenting to the hospital for further evaluation of syncopal episodes associated with tachycardia, dizziness, lightheadedness and unresolved pneumonia.  CT chest showing LUL lung mass with lab showing hypercalcemia concerning for malignancy. Hx of positive Quantiferon gold, ruled out active TB wiath sputum x3. Underwent thoracentesis, cytology is still pending, underwent navigational bronchoscopy with biopsy as well as EBUS with TBNA.   Pertinent  Medical History  AS above   Significant Hospital Events: Including procedures, antibiotic start and stop dates in addition to other pertinent events   TB rule out last week Thoracentesis last week  Nav bronch with biopsy this week   Interim History / Subjective:  Patient today reports ongoing chest pain worse with breathing. Stable from before. Frustrated with long wait time in the hospital for results.   Objective    Blood pressure 128/76, pulse 79, temperature (!) 97.5 F (36.4 C), temperature source Oral, resp. rate 20, height 6' 0.99 (1.854 m), weight 71 kg, SpO2 91%.        Intake/Output Summary (Last 24 hours) at 02/17/2024 1356 Last data filed at 02/17/2024 1110 Gross per 24 hour  Intake 3735.61 ml  Output 1000 ml  Net 2735.61 ml   Filed Weights   02/14/24 0503 02/15/24 0615 02/16/24 0458  Weight: 69.8 kg 69.6 kg 71 kg    Examination: General: ill apearing elderly male  Lungs: crackles more on the left Cardiovascular: RRR, Nl S1, S2 Abdomen: soft, NT, ND Extremities: No edema  Neuro: Grossly intact    Resolved problem list   Assessment and Plan  #LUL lung mass #Hx of latent TB #Hypercalcemia    Active TB ruled out. Cytology from thora remains pending. Concern for primary lung cancer. Waiting for biopsy results. Hypercalcemia improved. Patient doing well post biopsy.   Will continue to monitor for now. No new recommended changes   Best Practice (right click and Reselect all SmartList Selections daily)   Per Primary   Labs   CBC: Recent Labs  Lab 02/13/24 0255 02/14/24 0253 02/15/24 0421 02/16/24 0349 02/17/24 0626  WBC 22.4* 21.3* 22.9* 21.1* 33.3*  HGB 8.8* 8.6* 8.2* 7.9* 8.6*  HCT 28.2* 27.1* 25.6* 24.8* 27.2*  MCV 81.5 80.7 80.5 80.8 81.2  PLT 286 300 314 293 368    Basic Metabolic Panel: Recent Labs  Lab 02/12/24 0301 02/13/24 0255 02/14/24 0253 02/15/24 0421 02/16/24 0349 02/17/24 0626  NA 134* 134* 134* 134* 136 137  K 4.3 4.8 4.4 4.3 3.7 4.9  CL 99 105 106 106 108 105  CO2 24 21* 20* 19* 20* 20*  GLUCOSE 103* 92 110* 106* 120* 129*  BUN 16 11 6* 6* 7* 12  CREATININE 1.10 0.92 0.87 0.90 1.01 0.91  CALCIUM  9.1 8.7* 8.8* 8.4* 8.8* 9.2  MG 2.4  --  1.5* 2.0 1.6* 2.2   GFR: Estimated Creatinine Clearance: 73.7 mL/min (by C-G formula based on SCr of 0.91 mg/dL). Recent Labs  Lab 02/14/24 0253 02/15/24 0421 02/16/24 0349 02/17/24 0626  WBC 21.3* 22.9* 21.1* 33.3*    Liver Function Tests: Recent Labs  Lab 02/11/24 0259 02/14/24 0253 02/15/24 0421 02/16/24 0349  AST 24 35 29 30  ALT 16 23 22 20   ALKPHOS 86 112 108 115  BILITOT 0.4 0.5 0.3 <0.2  PROT 4.7* 5.5* 5.3* 4.9*  ALBUMIN  1.7* 1.8* 1.7* 1.5*   No results for input(s): LIPASE, AMYLASE in the last 168 hours. No results for input(s): AMMONIA in the last 168 hours.  ABG    Component Value Date/Time   TCO2 27 04/28/2010 2029     Coagulation Profile: No results for input(s): INR, PROTIME in the last 168 hours.  Cardiac Enzymes: No results for input(s): CKTOTAL, CKMB, CKMBINDEX, TROPONINI in the last 168 hours.  HbA1C: Hgb A1c MFr Bld  Date/Time Value  Ref Range Status  07/06/2023 02:33 PM 5.9 (H) <5.7 % of total Hgb Final    Comment:    For someone without known diabetes, a hemoglobin  A1c value between 5.7% and 6.4% is consistent with prediabetes and should be confirmed with a  follow-up test. . For someone with known diabetes, a value <7% indicates that their diabetes is well controlled. A1c targets should be individualized based on duration of diabetes, age, comorbid conditions, and other considerations. . This assay result is consistent with an increased risk of diabetes. . Currently, no consensus exists regarding use of hemoglobin A1c for diagnosis of diabetes for children. SABRA   12/25/2022 11:17 AM 6.0 (H) <5.7 % of total Hgb Final    Comment:    For someone without known diabetes, a hemoglobin  A1c value between 5.7% and 6.4% is consistent with prediabetes and should be confirmed with a  follow-up test. . For someone with known diabetes, a value <7% indicates that their diabetes is well controlled. A1c targets should be individualized based on duration of diabetes, age, comorbid conditions, and other considerations. . This assay result is consistent with an increased risk of diabetes. . Currently, no consensus exists regarding use of hemoglobin A1c for diagnosis of diabetes for children. .     CBG: Recent Labs  Lab 02/10/24 1620  GLUCAP 107*    Review of Systems:   Negative except as above   Past Medical History:  He,  has a past medical history of Anxiety, Atrial fibrillation (HCC), Blood transfusion without reported diagnosis, Cataract, Depression, Emphysema of lung (HCC), Heart murmur, Hyperlipidemia, Hypertension, PVD (peripheral vascular disease) (HCC), Raynaud's syndrome, Substance abuse (HCC), Systemic sclerosis (HCC), TB lung, latent (12/19/2019), and Transaminitis (06/18/2020).   Surgical History:   Past Surgical History:  Procedure Laterality Date   ABDOMINAL AORTOGRAM W/LOWER EXTREMITY  Left 07/09/2023   Procedure: ABDOMINAL AORTOGRAM W/LOWER EXTREMITY;  Surgeon: Court Dorn PARAS, MD;  Location: [facility] INVASIVE [facility] LAB;  Service: Cardiovascular;  Laterality: Left;   ATRIAL FIBRILLATION ABLATION     CATARACT EXTRACTION Right    COLONOSCOPY     ENDARTERECTOMY FEMORAL Left 09/02/2023   Procedure: LEFT LOWER EXTREMITY ANGIOGRAM WITH LEFT ILIAC  STENTING;  Surgeon: Gretta Lonni PARAS, MD;  Location: [facility] OR;  Service: Vascular;  Laterality: Left;   ENDARTERECTOMY FEMORAL Right 11/09/2023   Procedure: ENDARTERECTOMY, FEMORAL TO PROFUNDA;  Surgeon: Gretta Lonni PARAS, MD;  Location: [facility] OR;  Service: Vascular;  Laterality: Right;   EYE SURGERY     FEMORAL-POPLITEAL BYPASS GRAFT Left 09/02/2023   Procedure: LEFT FEMORAL ENDARTARECTOMY WITH PATCH ANGIOPLASTY;  Surgeon: Gretta Lonni PARAS, MD;  Location: [facility] OR;  Service: Vascular;  Laterality: Left;   FRACTURE SURGERY Left 1976   HERNIA REPAIR  2023   INGUINAL HERNIA REPAIR Right 03/23/2020   Procedure: OPEN RIGHT INGUINAL HERNIA REPAIR WITH  MESH;  Surgeon: Gladis Cough, MD;  [facility] OR;  Service: General;  Laterality: Right;   INSERTION OF MESH Right 03/23/2020   Procedure: INSERTION OF MESH;  Surgeon: Gladis Cough, MD;  Location: [facility] OR;  Service: General;  Laterality: Right;   LOWER EXTREMITY INTERVENTION N/A 04/27/2017   Procedure: LOWER EXTREMITY INTERVENTION;  Surgeon: Court Dorn PARAS, MD;  Location: [facility];  Service: Cardiovascular;  Laterality: N/A;   PATCH ANGIOPLASTY Right 11/09/2023   Procedure: ANGIOPLASTY, USING PATCH GRAFT, USING XENOSURE 1X14CM;  Surgeon: Gretta Lonni PARAS, MD;  Location: [facility] OR;  Service: Vascular;  Laterality: Right;   PERIPHERAL VASCULAR ATHERECTOMY Right 04/27/2017   Procedure: PERIPHERAL VASCULAR ATHERECTOMY;  Surgeon: Court Dorn PARAS, MD;  Location: [facility] INVASIVE CV LAB;  Service: Cardiovascular;  Laterality: Right;  Iliac   PERIPHERAL VASCULAR INTERVENTION Right 04/27/2017    Procedure: PERIPHERAL VASCULAR INTERVENTION;  Surgeon: Court Dorn PARAS, MD;  Location: [facility];  Service: Cardiovascular;  Laterality: Right;  Iliac   THORACENTESIS Left 02/10/2024   Procedure: THORACENTESIS;  Surgeon: Theodoro Lakes, MD;  Location: [facility] ENDOSCOPY;  Service: Pulmonary;  Laterality: Left;     Social History:   reports that he has been smoking cigarettes. He started smoking about 54 years ago. He has a 108.9 pack-year smoking history. He has never used smokeless tobacco. He reports current alcohol use of about 2.0 standard drinks of alcohol per week. He reports current drug use. Frequency: 1.00 time per week. Drug: Oxycodone .   Family History:  His family history includes Cancer in his father and mother. There is no history of Colon cancer, Rectal cancer, Heart disease, Pancreatic cancer, Stomach cancer, or Esophageal cancer.   Allergies Allergies  Allergen Reactions   Cymbalta  [Duloxetine  Hcl]     unknown   Effexor [Venlafaxine]     Feels bad   Wellbutrin  [Bupropion ]     sleepy   Zoloft [Sertraline Hcl]     Felt bad     Home Medications  Prior to Admission medications   Medication Sig Start Date End Date Taking? Authorizing Provider  albuterol  (PROVENTIL ) (2.5 MG/3ML) 0.083% nebulizer solution Take 3 mLs (2.5 mg total) by nebulization every 6 (six) hours as needed for wheezing or shortness of breath. 01/12/24  Yes Sebastian Beverley NOVAK, MD  albuterol  (VENTOLIN  HFA) 108 [phone] Base) MCG/ACT inhaler Use  2 inhalations  15 minutes apart  every 4 hours  to rescue Asthma Attack 05/20/21  Yes Tonita Fallow, MD  budesonide -glycopyrrolate -formoterol  (BREZTRI  AEROSPHERE) 160-9-4.8 MCG/ACT AERO inhaler Inhale 2 puffs into the lungs in the morning and at bedtime. 01/21/24  Yes Tamea Dedra CROME, MD  Cholecalciferol  250 MCG (10000 UT) CAPS Take 10,000 Units by mouth every evening.   Yes [provider]  dofetilide  (TIKOSYN ) 500 MCG capsule TAKE 1 CAPSULE BY MOUTH 2  TIMES DAILY. 08/17/23  Yes Court Dorn PARAS, MD  gabapentin  (NEURONTIN ) 300 MG capsule Take 1 capsule 3 x /day for Neuropathy Pain Patient taking differently: Take 300 mg by mouth 3 (three) times daily. 08/14/23  Yes Webb, Padonda B, FNP  ketoconazole  (NIZORAL ) 2 % cream Apply 1 Application topically daily as needed for irritation.   Yes [provider]  oxyCODONE  (OXY IR/ROXICODONE ) 5 MG immediate release tablet Take 5 mg by mouth every 4 (four) hours as needed for severe pain (pain score 7-10).   Yes [provider]  pyridOXINE  (VITAMIN B-6) 100 MG tablet Take 100 mg by mouth every evening.  Yes [provider]  triamcinolone  cream (KENALOG ) 0.5 % Apply 1 Application topically daily as needed (skin irritation).   Yes [provider]  atorvastatin  (LIPITOR ) 80 MG tablet Take 1 tablet Daily for Cholesterol Patient taking differently: Take 80 mg by mouth daily. Take 1 tablet Daily for Cholesterol 08/13/23   Webb, Padonda B, FNP  rivaroxaban  (XARELTO ) 20 MG TABS tablet Take 1 tablet (20 mg total) by mouth daily with supper. Patient not taking: Reported on 02/03/2024 12/01/23   Elna Loud P, PA-C     I spent 26  minutes caring for this patient today.

## 2024-02-17 NOTE — Progress Notes (Signed)
 PT Cancellation Note  Patient Details Name: Bradley Ferguson MRN: [id number] DOB: Apr 23, 1951   Cancelled Treatment:    Reason Eval/Treat Not Completed: Fatigue/lethargy limiting ability to participate this afternoon. Pt reports fatigue from tests earlier in the day and declined all offers of mobility until after he naps. Will attempt to return as time/schedule allow.   Izetta Call, PT, DPT   Acute Rehabilitation Department Office [phone] Secure Chat Communication Preferred   Izetta JULIANNA Call 02/17/2024, 1:15 PM

## 2024-02-18 ENCOUNTER — Other Ambulatory Visit (HOSPITAL_COMMUNITY): Payer: Self-pay

## 2024-02-18 ENCOUNTER — Telehealth (HOSPITAL_COMMUNITY): Payer: Self-pay | Admitting: Pharmacy Technician

## 2024-02-18 DIAGNOSIS — R918 Other nonspecific abnormal finding of lung field: Secondary | ICD-10-CM | POA: Diagnosis not present

## 2024-02-18 LAB — SURGICAL PATHOLOGY

## 2024-02-18 LAB — CBC
HCT: 27.7 % — ABNORMAL LOW (ref 39.0–52.0)
Hemoglobin: 8.6 g/dL — ABNORMAL LOW (ref 13.0–17.0)
MCH: 25.4 pg — ABNORMAL LOW (ref 26.0–34.0)
MCHC: 31 g/dL (ref 30.0–36.0)
MCV: 82 fL (ref 80.0–100.0)
Platelets: 422 K/uL — ABNORMAL HIGH (ref 150–400)
RBC: 3.38 MIL/uL — ABNORMAL LOW (ref 4.22–5.81)
RDW: 21 % — ABNORMAL HIGH (ref 11.5–15.5)
WBC: 45.1 K/uL — ABNORMAL HIGH (ref 4.0–10.5)
nRBC: 0 % (ref 0.0–0.2)

## 2024-02-18 LAB — PROCALCITONIN: Procalcitonin: 0.19 ng/mL

## 2024-02-18 LAB — HEPARIN LEVEL (UNFRACTIONATED): Heparin Unfractionated: 0.41 [IU]/mL (ref 0.30–0.70)

## 2024-02-18 LAB — BASIC METABOLIC PANEL WITH GFR
Anion gap: 12 (ref 5–15)
BUN: 15 mg/dL (ref 8–23)
CO2: 19 mmol/L — ABNORMAL LOW (ref 22–32)
Calcium: 9.1 mg/dL (ref 8.9–10.3)
Chloride: 104 mmol/L (ref 98–111)
Creatinine, Ser: 0.84 mg/dL (ref 0.61–1.24)
GFR, Estimated: >60 mL/min (ref >60)
Glucose, Bld: 119 mg/dL — ABNORMAL HIGH (ref 70–99)
Potassium: 4.5 mmol/L (ref 3.5–5.1)
Sodium: 135 mmol/L (ref 135–145)

## 2024-02-18 LAB — ACID FAST SMEAR (AFB, MYCOBACTERIA): Acid Fast Smear: NEGATIVE

## 2024-02-18 LAB — MAGNESIUM: Magnesium: 1.8 mg/dL (ref 1.7–2.4)

## 2024-02-18 MED ORDER — MAGNESIUM SULFATE 2 GM/50ML IV SOLN
2.0000 g | Freq: Once | INTRAVENOUS | Status: AC
  Administered 2024-02-18: 2 g via INTRAVENOUS
  Filled 2024-02-18: qty 50

## 2024-02-18 NOTE — Progress Notes (Signed)
 PROGRESS NOTE    Bradley Ferguson  FMW:[id number] DOB: 1950-11-17 DOA: 02/03/2024 PCP: Sebastian Beverley NOVAK, MD   Brief Narrative:    73 y.o. male with medical history significant of patient with history of COPD, interstitial lung disease, patient has latent tuberculosis, atrial fibrillation on Tikosyn  and Xarelto , peripheral arterial disease, essential hypertension, anxiety disorder depression, history of hematuria with lung consolidation was on antibiotics by pulmonary.  Patient went to see pulmonary today due to near syncope and productive cough.  He was found to have significant dyspnea and tachycardia with heart rate in the 120s.  Patient noted to have oxygen saturation 90% on room air.  He was however noted to have hypercalcemia, leukocytosis with left shift and lactic acidosis.  He was admitted for acute hypoxemic respiratory failure in the setting of left upper lobe consolidation and underwent bronchoscopy on 8/19.  Now noted to have worsening leukocytosis and remains on IV antibiotics with BAL showing 20,000 colonies of yeast. He continues to have some worsening leukocytosis.  Assessment & Plan:   Principal Problem:   Mass of upper lobe of left lung Active Problems:   ANXIETY DEPRESSION   Bipolar I disorder (HCC)   Atrial fibrillation (HCC)   Peripheral arterial disease (HCC)   Chronic obstructive pulmonary disease (HCC)   Interstitial pulmonary disease (HCC)   TB lung, latent   Essential hypertension   Severe sepsis (HCC)   Hypercalcemia   Mediastinal adenopathy  Assessment and Plan:   Acute hypoxic respiratory failure in the setting of left upper lobe consolidation -CT scan on 8/1 showed slightly increase of the left upper lobe dense consolidation, concerning for infection versus malignancy.  He has been having worsening shortness of breath, cough, hemoptysis on presentation.  Pulmonary consulted and following.  Legionella negative. Histoplasma antigen negative. Fungitell beta D  glucan negative. Blastomyces antigen negative as well - Given history of latent TB, he underwent AFB, now negative x 3 - Has been placed on Zosyn , continue.  Appreciate pulmonary follow-up, - Status post bronchoscopy 8/19 with cultures showing some yeast; appreciate pulmonary recommendations. -Noted to have worsening leukocytosis, but with no fever noted, monitor CBC.   Active problems Concern for primary lung cancer -given left upper lobe mass, significant hypercalcemia there is a good likelihood of malignancy.  CT abdomen pelvis with contrast did show a left lateral psoas nodule concerning for metastasis - Postthoracentesis on Wednesday, cytology pending.  Will get bronchoscopy today, awaiting report and results.  If cytology and bronchoscopy is negative for malignancy, patient may need IR consult of the left lateral psoas nodule    Hypercalcemia of malignancy-over 15 on presentation.  PTH appropriately low, PTH RP pending.  Received fluids, Lasix , calcium  now is better, 9.2 this morning.  Monitor moving forward   Acute kidney injury-resolved, creatinine back to baseline   Pulmonary fibrosis, emphysema-contributing to his dyspnea   Persistent A-fib-continue dofetilide , on heparin .  Appears to be on Xarelto  at home.   Concern for severe sepsis-sepsis physiology improved.  Likely due to his possible upper lobe consolidation.  Continue antibiotics.  Noted to have worsening leukocytosis, monitor.   Bipolar 1 disorder-continue home regimen    DVT prophylaxis: Heparin  drip Code Status: Full Family Communication: None at bedside Disposition Plan: Plan for SNF on discharge. Status is: Inpatient Remains inpatient appropriate because: Need for IV antibiotics.   Consultants:  Pulmonology  Procedures:  Bronchoscopy with biopsies on 8/19  Antimicrobials:  Anti-infectives (From admission, onward)    Start  Dose/Rate Route Frequency Ordered Stop   02/08/24 1645  piperacillin -tazobactam  (ZOSYN ) IVPB 3.375 g       Note to Pharmacy: Please change dosing if needed.   3.375 g 12.5 mL/hr over 240 Minutes Intravenous Every 8 hours 02/08/24 1549     02/05/24 1800  doxycycline  (VIBRA -TABS) tablet 100 mg        100 mg Oral Every 12 hours 02/05/24 1428 02/09/24 1721   02/04/24 1700  azithromycin  (ZITHROMAX ) 500 mg in sodium chloride  0.9 % 250 mL IVPB  Status:  Discontinued        500 mg 250 mL/hr over 60 Minutes Intravenous Every 24 hours 02/03/24 1945 02/03/24 2145   02/04/24 1600  cefTRIAXone  (ROCEPHIN ) 1 g in sodium chloride  0.9 % 100 mL IVPB  Status:  Discontinued        1 g 200 mL/hr over 30 Minutes Intravenous Every 24 hours 02/03/24 1945 02/08/24 1549   02/04/24 1600  doxycycline  (VIBRAMYCIN ) 100 mg in sodium chloride  0.9 % 250 mL IVPB  Status:  Discontinued        100 mg 125 mL/hr over 120 Minutes Intravenous Every 12 hours 02/03/24 2145 02/05/24 1428   02/03/24 1645  cefTRIAXone  (ROCEPHIN ) 2 g in sodium chloride  0.9 % 100 mL IVPB  Status:  Discontinued        2 g 200 mL/hr over 30 Minutes Intravenous Once 02/03/24 1643 02/03/24 1643   02/03/24 1645  azithromycin  (ZITHROMAX ) 500 mg in sodium chloride  0.9 % 250 mL IVPB  Status:  Discontinued        500 mg 250 mL/hr over 60 Minutes Intravenous  Once 02/03/24 1643 02/03/24 1643   02/03/24 1615  cefTRIAXone  (ROCEPHIN ) 1 g in sodium chloride  0.9 % 100 mL IVPB        1 g 200 mL/hr over 30 Minutes Intravenous  Once 02/03/24 1604 02/03/24 1656   02/03/24 1615  azithromycin  (ZITHROMAX ) 500 mg in sodium chloride  0.9 % 250 mL IVPB        500 mg 250 mL/hr over 60 Minutes Intravenous  Once 02/03/24 1604 02/03/24 1801       Subjective: Patient seen and evaluated today with no new acute complaints or concerns. No acute concerns or events noted overnight.  Denies any chest pain or shortness of breath, but noted to have worsening leukocytosis.  Objective: Vitals:   02/18/24 0307 02/18/24 0351 02/18/24 0910 02/18/24 1210  BP:  138/70  112/78 (!) 121/94  Pulse: (!) 105 87 (!) 102 99  Resp: 19 16 18 19   Temp: 97.9 F (36.6 C)  98.8 F (37.1 C) 97.9 F (36.6 C)  TempSrc: Oral  Oral Oral  SpO2: 94% 94% 95% 97%  Weight: 73.6 kg     Height:        Intake/Output Summary (Last 24 hours) at 02/18/2024 1554 Last data filed at 02/18/2024 1304 Gross per 24 hour  Intake 960 ml  Output 850 ml  Net 110 ml   Filed Weights   02/15/24 0615 02/16/24 0458 02/18/24 0307  Weight: 69.6 kg 71 kg 73.6 kg    Examination:  General exam: Appears calm and comfortable  Respiratory system: Clear to auscultation. Respiratory effort normal.  On nasal cannula. Cardiovascular system: S1 & S2 heard, RRR.  Gastrointestinal system: Abdomen is soft Central nervous system: Alert and awake Extremities: No edema Skin: No significant lesions noted Psychiatry: Flat affect.    Data Reviewed: I have personally reviewed following labs and imaging studies  CBC: Recent Labs  Lab 02/14/24 0253 02/15/24 0421 02/16/24 0349 02/17/24 0626 02/18/24 0311  WBC 21.3* 22.9* 21.1* 33.3* 45.1*  HGB 8.6* 8.2* 7.9* 8.6* 8.6*  HCT 27.1* 25.6* 24.8* 27.2* 27.7*  MCV 80.7 80.5 80.8 81.2 82.0  PLT 300 314 293 368 422*   Basic Metabolic Panel: Recent Labs  Lab 02/14/24 0253 02/15/24 0421 02/16/24 0349 02/17/24 0626 02/18/24 0311  NA 134* 134* 136 137 135  K 4.4 4.3 3.7 4.9 4.5  CL 106 106 108 105 104  CO2 20* 19* 20* 20* 19*  GLUCOSE 110* 106* 120* 129* 119*  BUN 6* 6* 7* 12 15  CREATININE 0.87 0.90 1.01 0.91 0.84  CALCIUM  8.8* 8.4* 8.8* 9.2 9.1  MG 1.5* 2.0 1.6* 2.2 1.8   GFR: Estimated Creatinine Clearance: 82.8 mL/min (by C-G formula based on SCr of 0.84 mg/dL). Liver Function Tests: Recent Labs  Lab 02/14/24 0253 02/15/24 0421 02/16/24 0349  AST 35 29 30  ALT 23 22 20   ALKPHOS 112 108 115  BILITOT 0.5 0.3 <0.2  PROT 5.5* 5.3* 4.9*  ALBUMIN  1.8* 1.7* 1.5*   No results for input(s): LIPASE, AMYLASE in the last 168  hours. No results for input(s): AMMONIA in the last 168 hours. Coagulation Profile: No results for input(s): INR, PROTIME in the last 168 hours. Cardiac Enzymes: No results for input(s): CKTOTAL, CKMB, CKMBINDEX, TROPONINI in the last 168 hours. BNP (last 3 results) No results for input(s): PROBNP in the last 8760 hours. HbA1C: No results for input(s): HGBA1C in the last 72 hours. CBG: No results for input(s): GLUCAP in the last 168 hours.  Lipid Profile: No results for input(s): CHOL, HDL, LDLCALC, TRIG, CHOLHDL, LDLDIRECT in the last 72 hours. Thyroid  Function Tests: No results for input(s): TSH, T4TOTAL, FREET4, T3FREE, THYROIDAB in the last 72 hours. Anemia Panel: No results for input(s): VITAMINB12, FOLATE, FERRITIN, TIBC, IRON, RETICCTPCT in the last 72 hours. Sepsis Labs: No results for input(s): PROCALCITON, LATICACIDVEN in the last 168 hours.  Recent Results (from the past 240 hours)  Blastomyces Antigen     Status: None   Collection Time: 02/08/24  4:50 PM   Specimen: Blood  Result Value Ref Range Status   Blastomyces Antigen None Detected None Detected ng/mL Final    Comment: (NOTE) Reference Interval: None Detected Reportable Range: 0.31 ng/mL - 20.00 ng/mL Results above 20.00 ng/mL are reported as 'Positive, Above the Limit of Quantification' This test was developed and its performance characteristics determined by [employer]. It has not been cleared or approved by the FDA; however, FDA clearance or approval is not currently required for clinical use. The results are not intended to be used as the sole means for clinical diagnosis or patient decisions.    Interpretation Negative  Final   Specimen Type SERUM  Final    Comment: (NOTE) Performed At: [facility] [address] [place], [place] [id number] [place] MD Ey:[id number]   Body fluid culture w Gram Stain      Status: None   Collection Time: 02/10/24  1:32 PM   Specimen: Pleural Fluid  Result Value Ref Range Status   Specimen Description PLEURAL  Final   Special Requests NONE  Final   Gram Stain   Final    WBC PRESENT, PREDOMINANTLY MONONUCLEAR NO ORGANISMS SEEN CYTOSPIN SMEAR    Culture   Final    NO GROWTH 3 DAYS Performed at [facility], [address] [place], [place] [address]  Report Status 02/13/2024 FINAL  Final  Culture, BAL-quantitative w Gram Stain     Status: Abnormal (Preliminary result)   Collection Time: 02/16/24  3:46 PM   Specimen: Bronchial Alveolar Lavage; Respiratory  Result Value Ref Range Status   Specimen Description BRONCHIAL ALVEOLAR LAVAGE  Final   Special Requests LUL  Final   Gram Stain   Final    FEW WBC PRESENT, PREDOMINANTLY PMN NO ORGANISMS SEEN    Culture (A)  Final    20,000 COLONIES/mL YEAST CULTURE REINCUBATED FOR BETTER GROWTH Performed at [facility], [address] [place], [place] [address]    Report Status PENDING  Incomplete  Anaerobic culture w Gram Stain     Status: None (Preliminary result)   Collection Time: 02/16/24  3:46 PM   Specimen: Bronchoalveolar Lavage  Result Value Ref Range Status   Specimen Description BRONCHIAL ALVEOLAR LAVAGE  Final   Special Requests LUL  Final   Gram Stain   Final    FEW WBC PRESENT, PREDOMINANTLY PMN NO ORGANISMS SEEN Performed at [facility], [address], [place], [place] [address]    Culture   Final    NO ANAEROBES ISOLATED; CULTURE IN PROGRESS FOR 5 DAYS   Report Status PENDING  Incomplete         Radiology Studies: DG Chest Port 1 View Result Date: 02/16/2024 CLINICAL DATA:  Status post bronchoscopy with biopsy. EXAM: PORTABLE CHEST 1 VIEW COMPARISON:  February 15, 2024. FINDINGS: Stable cardiomediastinal silhouette. Stable left lung opacity is noted concerning for pneumonia. No pneumothorax is noted. Stable left pleural effusion with associated atelectasis or  infiltrate. Minimal right basilar subsegmental atelectasis is noted. Bony thorax is unremarkable. IMPRESSION: Stable left lung opacity is noted concerning for pneumonia. Stable left pleural effusion with associated atelectasis or infiltrate. No pneumothorax is noted. Electronically Signed   By: Lynwood Landy Raddle M.D.   On: 02/16/2024 16:39        Scheduled Meds:  budesonide -glycopyrrolate -formoterol   2 puff Inhalation BID   dofetilide   500 mcg Oral BID   gabapentin   300 mg Oral TID   guaiFENesin -dextromethorphan   5 mL Oral Once   polyethylene glycol  17 g Oral Daily   pyridOXINE   100 mg Oral QPM   Continuous Infusions:  sodium chloride  75 mL/hr at 02/18/24 0138   heparin  1,850 Units/hr (02/18/24 0307)   piperacillin -tazobactam 3.375 g (02/18/24 1039)     LOS: 15 days    Time spent: 55 minutes    Tilmon Wisehart D Maree, DO [facility]  If 7PM-7AM, please contact night-coverage [email or web address] 02/18/2024, 3:54 PM

## 2024-02-18 NOTE — Telephone Encounter (Signed)
 Patient Product/process development scientist completed.    The patient is insured through [employer]/ [employer]. Patient has Medicare and is not eligible for a copay card, but may be able to apply for patient assistance or Medicare RX Payment Plan (Patient Must reach out to their plan, if eligible for payment plan), if available.    Ran test claim for Xarelto  20 mg and the current 30 day co-pay is $47.00.   This test claim was processed through [facility]- copay amounts may vary at other pharmacies due to pharmacy/plan contracts, or as the patient moves through the different stages of their insurance plan.     Reyes Sharps, CPHT Pharmacy Technician III Certified Patient Advocate [facility] Patient Advocate Team Direct Number: [phone]  Fax: [phone]

## 2024-02-18 NOTE — Progress Notes (Signed)
 PHARMACY - ANTICOAGULATION CONSULT NOTE  Pharmacy Consult for Heparin  Indication: atrial fibrillation  Allergies  Allergen Reactions   Cymbalta  [Duloxetine  Hcl]     unknown   Effexor [Venlafaxine]     Feels bad   Wellbutrin  [Bupropion ]     sleepy   Zoloft [Sertraline Hcl]     Felt bad    Patient Measurements: Height: 6' 0.99 (185.4 cm) Weight: 73.6 kg (162 lb 4.1 oz) IBW/kg (Calculated) : 79.88 HEPARIN  DW (KG): 69.8  Vital Signs: Temp: 98.8 F (37.1 C) (08/21 0910) Temp Source: Oral (08/21 0910) BP: 112/78 (08/21 0910) Pulse Rate: 102 (08/21 0910)  Labs: Recent Labs    02/16/24 0349 02/17/24 0626 02/17/24 0832 02/18/24 0311 02/18/24 0746  HGB 7.9* 8.6*  --  8.6*  --   HCT 24.8* 27.2*  --  27.7*  --   PLT 293 368  --  422*  --   HEPARINUNFRC 0.21*  --  0.40  --  0.41  CREATININE 1.01 0.91  --  0.84  --     Estimated Creatinine Clearance: 82.8 mL/min (by C-G formula based on SCr of 0.84 mg/dL).  Assessment: 73 y.o. male with h/o Afib, PTA Xarelto  on hold (patient reported not taking anymore but fill history shows recent fills). Pharmacy has been consulted to dose IV heparin .   S/p bronch and biopsy 8/19 afternoon. Per Dr .Shelah, okay to resume heparin  in 8 hours (0000 8/20).   Heparin  level is therapeutic (0.41) on 1850 units/hr. CBC stable.   Goal of Therapy:  Heparin  level 0.3-0.7 units/ml Monitor platelets by anticoagulation protocol: Yes   Plan:  Continue heparin  drip at 1850 untis/hr Daily heparin  level and CBC. Monitor for signs/symptoms of bleeding. F/u resuming Xarelto  when able.  Harlene Barlow, [employer], [facility] Clinical Pharmacist  02/18/2024 11:07 AM   [facility] pharmacy phone numbers are listed on [email or web address]

## 2024-02-18 NOTE — Progress Notes (Signed)
 Bradley Ferguson Cancellation Note  Patient Details Name: Bradley Ferguson MRN: [id number] DOB: 02/07/1951   Cancelled Treatment:    Reason Eval/Treat Not Completed: Patient declined, no reason specified.  Significant fatigue and worry about today's update.  Maybe if my news is good later 02/18/2024  Bradley HERO., Bradley Ferguson Acute Rehabilitation Services [phone]  (office)   Bradley Ferguson 02/18/2024, 12:02 PM

## 2024-02-18 NOTE — Plan of Care (Signed)

## 2024-02-19 DIAGNOSIS — R918 Other nonspecific abnormal finding of lung field: Secondary | ICD-10-CM | POA: Diagnosis not present

## 2024-02-19 DIAGNOSIS — C3412 Malignant neoplasm of upper lobe, left bronchus or lung: Secondary | ICD-10-CM

## 2024-02-19 DIAGNOSIS — R55 Syncope and collapse: Secondary | ICD-10-CM

## 2024-02-19 LAB — CBC WITH DIFFERENTIAL/PLATELET
Abs Granulocyte: 37.9 K/uL — ABNORMAL HIGH (ref 1.5–6.5)
Abs Immature Granulocytes: 1.45 K/uL — ABNORMAL HIGH (ref 0.00–0.07)
Basophils Absolute: 0.1 K/uL (ref 0.0–0.1)
Basophils Relative: 0 %
Eosinophils Absolute: 0 K/uL (ref 0.0–0.5)
Eosinophils Relative: 0 %
HCT: 29.3 % — ABNORMAL LOW (ref 39.0–52.0)
Hemoglobin: 9.3 g/dL — ABNORMAL LOW (ref 13.0–17.0)
Immature Granulocytes: 3 %
Lymphocytes Relative: 3 %
Lymphs Abs: 1.2 K/uL (ref 0.7–4.0)
MCH: 25.6 pg — ABNORMAL LOW (ref 26.0–34.0)
MCHC: 31.7 g/dL (ref 30.0–36.0)
MCV: 80.7 fL (ref 80.0–100.0)
Monocytes Absolute: 2.2 K/uL — ABNORMAL HIGH (ref 0.1–1.0)
Monocytes Relative: 5 %
Neutro Abs: 37.9 K/uL — ABNORMAL HIGH (ref 1.7–7.7)
Neutrophils Relative %: 89 %
Platelets: 440 K/uL — ABNORMAL HIGH (ref 150–400)
RBC: 3.63 MIL/uL — ABNORMAL LOW (ref 4.22–5.81)
RDW: 20.9 % — ABNORMAL HIGH (ref 11.5–15.5)
WBC: 42.8 K/uL — ABNORMAL HIGH (ref 4.0–10.5)
nRBC: 0 % (ref 0.0–0.2)

## 2024-02-19 LAB — BASIC METABOLIC PANEL WITH GFR
Anion gap: 11 (ref 5–15)
BUN: 11 mg/dL (ref 8–23)
CO2: 21 mmol/L — ABNORMAL LOW (ref 22–32)
Calcium: 9 mg/dL (ref 8.9–10.3)
Chloride: 105 mmol/L (ref 98–111)
Creatinine, Ser: 0.79 mg/dL (ref 0.61–1.24)
GFR, Estimated: >60 mL/min (ref >60)
Glucose, Bld: 105 mg/dL — ABNORMAL HIGH (ref 70–99)
Potassium: 4.2 mmol/L (ref 3.5–5.1)
Sodium: 137 mmol/L (ref 135–145)

## 2024-02-19 LAB — CYTOLOGY - NON PAP

## 2024-02-19 LAB — CULTURE, BAL-QUANTITATIVE W GRAM STAIN: Culture: 20000 — AB

## 2024-02-19 LAB — MAGNESIUM: Magnesium: 1.9 mg/dL (ref 1.7–2.4)

## 2024-02-19 LAB — HEPARIN LEVEL (UNFRACTIONATED): Heparin Unfractionated: 0.29 [IU]/mL — ABNORMAL LOW (ref 0.30–0.70)

## 2024-02-19 MED ORDER — FENTANYL 12 MCG/HR TD PT72
1.0000 | MEDICATED_PATCH | TRANSDERMAL | Status: DC
  Administered 2024-02-19 – 2024-02-22 (×2): 1 via TRANSDERMAL
  Filled 2024-02-19 (×2): qty 1

## 2024-02-19 MED ORDER — MORPHINE (PF) INJECTION FOR INHALATION 10 MG/ML: 5.0000 mg | RESPIRATORY_TRACT | Status: DC | PRN

## 2024-02-19 MED ORDER — MORPHINE SULFATE 0.5 MG/ML IV SOLN: 5.0000 mg | INTRAVENOUS | Status: DC | PRN

## 2024-02-19 MED ORDER — MORPHINE (PF) INJECTION FOR INHALATION 10 MG/ML
10.0000 mg | RESPIRATORY_TRACT | Status: DC | PRN
  Filled 2024-02-19: qty 1

## 2024-02-19 MED ORDER — PREDNISONE 20 MG PO TABS
40.0000 mg | ORAL_TABLET | Freq: Every day | ORAL | Status: AC
  Administered 2024-02-19 – 2024-02-23 (×5): 40 mg via ORAL
  Filled 2024-02-19 (×5): qty 2

## 2024-02-19 MED ORDER — MORPHINE SULFATE 10 MG/ML IV SOLN: 5.0000 mg | INTRAVENOUS | Status: DC | PRN

## 2024-02-19 MED ORDER — MAGNESIUM SULFATE 2 GM/50ML IV SOLN
2.0000 g | Freq: Once | INTRAVENOUS | Status: AC
  Administered 2024-02-19: 2 g via INTRAVENOUS
  Filled 2024-02-19: qty 50

## 2024-02-19 MED ORDER — HYDROCOD POLI-CHLORPHE POLI ER 10-8 MG/5ML PO SUER
5.0000 mL | Freq: Three times a day (TID) | ORAL | Status: DC | PRN
  Administered 2024-02-20 – 2024-02-28 (×11): 5 mL via ORAL
  Filled 2024-02-19 (×12): qty 5

## 2024-02-19 MED ORDER — NON FORMULARY
5.0000 mg | Status: DC | PRN
Start: 2024-02-19 — End: 2024-02-19

## 2024-02-19 MED ORDER — MORPHINE (PF) INJECTION FOR INHALATION 10 MG/ML: 10.0000 mg | RESPIRATORY_TRACT | Status: DC | PRN

## 2024-02-19 NOTE — Consult Note (Addendum)
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 Findings are nonspecific and may reflect an infectious/inflammatory process with possible treatment failure, however underlying malignancy is a pertinent differential consideration warranting further evaluation. Suggest pulmonary consultation with consideration for direct tissue sampling and/or nuclear medicine PET-CT. 2. Prominent mediastinal lymph nodes are nonspecific but similar prior suggest continued attention on follow-up imaging. 3. Similar findings of basilar predominant interstitial lung disease. 4. Aortic atherosclerosis. Aortic Atherosclerosis (ICD10-I70.0). Electronically Signed   By: Reyes Holder M.D.   On: 02/03/2024 15:06     The total time spent in the appointment was 55 minutes encounter with patients including review of chart and various tests results, discussions about plan of care and coordination of care plan   All questions were answered. The patient knows to call the clinic with any problems, questions or concerns. No barriers to learning was detected.  Olam JINNY Brunner, NP 8/22/20253:42 PM  ADDENDUM: I saw and examined [name].  Unfortunately, I think this is going to be a very  difficult problem.  He has stage IV disease by his CAT scans.  He is lost 100 pounds.  He has a marginal performance status  at best (ECOG 2-3).  I really do not think that we are going to be able to treat him with systemic therapy.  I think the only option that we would have would be to treat with targeted therapy if he has a actionable mutation.  I think this would be very unlikely given that he is a longtime smoker.  I talked to him about his situation.  I told him that I just did not think that we would be able to help his cancer.  I thought that trying to treat him would probably do more harm than good.  This is all about quality of life.  Clearly, he is having some difficulties with his breathing.  I really think that he needs to get on a more aggressive regimen to try to help with his breathing.  I think that we should try him on nebulized morphine .  This is a very good bronchodilator.  I also think he probably needs to be on a Duragesic  patch to try to help with pain issues and also help with breathing.  I will also use Tussionex cough medicine to help with a cough that he has.  I did talk to him about end-of-life issues.  I explained to him that I thought that if he were to be kept alive heroically and put on life support that he would never come off because he is too weak and his body would want the machine to do the work for him.  He told me that he does not want to be kept alive on a machine.  He would not want to burden his family with this.  I totally understand.  I told him that even if we did not keep him alive on a machine that we would still do what we could do to help with his quality of life.  He understands this.  He says that he does not want to be kept alive heroically.  As such, he is a DO NOT RESUSCITATE.  I really believe that we are going to be looking at [facility] for him.  I really do not think he does have a lot of support locally.  I do feel bad that I just do not  think that we are not a position to be aggressive here.  I just think that his performance status is just not good enough that would allow us  to treat him.  Again, I will send his tumor off for molecular analysis.  He is very nice.  We had a very nice conversation.  I want to make sure that we focus on his comfort, his breathing difficulties, and any pain issues that he may have.   Jeralyn Crease, MD  2 Timothy 4:6-8

## 2024-02-19 NOTE — Progress Notes (Signed)
 Occupational Therapy Treatment Patient Details Name: Bradley Ferguson MRN: [id number] DOB: [birth year] Today's Date: 02/19/2024   History of present illness 73 y.o. male admitted 02/03/24 for near syncope at pulmonologist appt. 8/6 CT with increase in size of dense masslike consolidation LUL & pleural effusion. 8/12 CT with left psoas lesion concerning for metastasis. 8/19 bronchoscopy. PMH: COPD, ILD, Afib, HTN, PAD s/p bil fem endarerectomy, HLD, raynaud's syndrome, latent tuberculosis, tobacco abuse   OT comments  Pt received in bed, saddened by new dx. After therapeutic listening and empathizing, pt agreeable to transfer to recliner in room. Staff from [facility] entered the room, and pt eager to converse with them. Pt with decreased strength from previous session requiring up to mod assist to stand, once in standing pt c/o of lightheadedness and hastened transfers despite cues, min assist for short step pivot. Educated pt on importance of daily mobility and activity. Pt verbalizing understanding. Continue to recommend <3 hours of skilled rehab daily to optimize independence levels. Will continue to follow acutely.       If plan is discharge home, recommend the following:  A little help with walking and/or transfers;A little help with bathing/dressing/bathroom;Direct supervision/assist for medications management;Assist for transportation   Equipment Recommendations  Other (comment) (Defer to next venue)    Recommendations for Other Services      Precautions / Restrictions Precautions Precautions: Fall;Other (comment) Recall of Precautions/Restrictions: Impaired Precaution/Restrictions Comments: watch sats Restrictions Weight Bearing Restrictions Per Provider Order: No       Mobility Bed Mobility Overal bed mobility: Needs Assistance Bed Mobility: Supine to Sit     Supine to sit: Supervision     General bed mobility comments: HOB elevated    Transfers Overall transfer  level: Needs assistance Equipment used: Rolling walker (2 wheels) Transfers: Sit to/from Stand Sit to Stand: Mod assist     Step pivot transfers: Min assist     General transfer comment: Mod assist for STS with cues for hand placement. In standing min assist for transfer d/t pt reporting dizziness began to transfer faster and unsafe     Balance Overall balance assessment: Needs assistance Sitting-balance support: Feet supported Sitting balance-Leahy Scale: Fair     Standing balance support: Bilateral upper extremity supported Standing balance-Leahy Scale: Poor Standing balance comment: reliant on RW         ADL either performed or assessed with clinical judgement   ADL Overall ADL's : Needs assistance/impaired Eating/Feeding: Set up;Sitting       Toilet Transfer: Moderate assistance;Ambulation;Rolling walker (2 wheels) Toilet Transfer Details (indicate cue type and reason): Simulated to recliner, mod assist to come to stand         Functional mobility during ADLs: Moderate assistance;Rolling walker (2 wheels);Cueing for safety General ADL Comments: Limited strength and balance    Extremity/Trunk Assessment Upper Extremity Assessment Upper Extremity Assessment: Generalized weakness   Lower Extremity Assessment Lower Extremity Assessment: Defer to PT evaluation        Vision   Vision Assessment?: No apparent visual deficits         Communication Communication Communication: No apparent difficulties   Cognition Arousal: Alert Behavior During Therapy: WFL for tasks assessed/performed Cognition: Cognition impaired     Awareness: Online awareness impaired   Attention impairment (select first level of impairment): Alternating attention Executive functioning impairment (select all impairments): Problem solving       Following commands: Intact        Cueing   Cueing Techniques: Verbal cues,  Gestural cues, Tactile cues        General Comments O2  stable on 3L    Pertinent Vitals/ Pain       Pain Assessment Pain Assessment: Faces Faces Pain Scale: Hurts little more Pain Location: back Pain Descriptors / Indicators: Discomfort, Grimacing Pain Intervention(s): Limited activity within patient's tolerance   Frequency  Min 2X/week        Progress Toward Goals  OT Goals(current goals can now be found in the care plan section)  Progress towards OT goals: Progressing toward goals  Acute Rehab OT Goals Patient Stated Goal: To get answers regarding dx OT Goal Formulation: With patient Time For Goal Achievement: 02/19/24 Potential to Achieve Goals: Good ADL Goals Pt Will Perform Grooming: with supervision;standing Pt Will Perform Lower Body Dressing: with supervision;sit to/from stand Pt Will Transfer to Toilet: with supervision;ambulating;regular height toilet Pt Will Perform Toileting - Clothing Manipulation and hygiene: with supervision;sit to/from stand  Plan         AM-PAC OT 6 Clicks Daily Activity     Outcome Measure   Help from another person eating meals?: None Help from another person taking care of personal grooming?: A Little Help from another person toileting, which includes using toliet, bedpan, or urinal?: A Little Help from another person bathing (including washing, rinsing, drying)?: A Little Help from another person to put on and taking off regular upper body clothing?: A Little Help from another person to put on and taking off regular lower body clothing?: A Little 6 Click Score: 19    End of Session Equipment Utilized During Treatment: Gait belt;Rolling walker (2 wheels)  OT Visit Diagnosis: Unsteadiness on feet (R26.81);Other abnormalities of gait and mobility (R26.89);Repeated falls (R29.6);Muscle weakness (generalized) (M62.81);History of falling (Z91.81)   Activity Tolerance Patient limited by fatigue   Patient Left in chair;with call bell/phone within reach;with chair alarm set;with  nursing/sitter in room   Nurse Communication Mobility status        Time: 8475-8451 OT Time Calculation (min): 24 min  Charges: OT General Charges $OT Visit: 1 Visit OT Treatments $Self Care/Home Management : 23-37 mins  Adrianne BROCKS, OT  Acute Rehabilitation Services Office [phone] Secure chat preferred   [name] Savers 02/19/2024, 4:17 PM

## 2024-02-19 NOTE — Significant Event (Addendum)
 Cytology from pleural fluid came back positive as carcinoma. Patient has now documented stage IV lung cancer.  Pulmonary will sign off.

## 2024-02-19 NOTE — Progress Notes (Signed)
 PT Cancellation Note  Patient Details Name: Bradley Ferguson MRN: [id number] DOB: 21-Feb-1951   Cancelled Treatment:    Reason Eval/Treat Not Completed: Patient declined, no reason specified (pt states fatigue and still too internally distracted over awaiting diagnosis to participate in therapy. Pt agreeable to one more attempt before therapy signs off)   Alexandrina Fiorini B Yaiden Yang 02/19/2024, 10:35 AM Lenoard SQUIBB, PT Acute Rehabilitation Services Office: [phone]

## 2024-02-19 NOTE — Progress Notes (Signed)
 PROGRESS NOTE    Bradley Ferguson  FMW:[id number] DOB: August 08, 1950 DOA: 02/03/2024 PCP: Sebastian Beverley NOVAK, MD   Brief Narrative:    73 y.o. male with medical history significant of patient with history of COPD, interstitial lung disease, patient has latent tuberculosis, atrial fibrillation on Tikosyn  and Xarelto , peripheral arterial disease, essential hypertension, anxiety disorder depression, history of hematuria with lung consolidation was on antibiotics by pulmonary.  Patient went to see pulmonary today due to near syncope and productive cough.  He was found to have significant dyspnea and tachycardia with heart rate in the 120s.  Patient noted to have oxygen saturation 90% on room air.  He was however noted to have hypercalcemia, leukocytosis with left shift and lactic acidosis.  He was admitted for acute hypoxemic respiratory failure in the setting of left upper lobe consolidation and underwent bronchoscopy on 8/19.  Now noted to have worsening leukocytosis and remains on IV antibiotics with BAL showing 20,000 colonies of yeast. He continues to have some worsening leukocytosis that is likely related to new cancer diagnosis with concern for NSCLC.  Started on prednisone  per pulmonology with recommendations to continue antibiotics for 3 more days.  Oncology consulted for further evaluation.  Assessment & Plan:   Principal Problem:   Mass of upper lobe of left lung Active Problems:   ANXIETY DEPRESSION   Bipolar I disorder (HCC)   Atrial fibrillation (HCC)   Peripheral arterial disease (HCC)   Chronic obstructive pulmonary disease (HCC)   Interstitial pulmonary disease (HCC)   TB lung, latent   Essential hypertension   Severe sepsis (HCC)   Hypercalcemia   Mediastinal adenopathy  Assessment and Plan:   Acute hypoxic respiratory failure in the setting of left upper lobe consolidation -CT scan on 8/1 showed slightly increase of the left upper lobe dense consolidation, concerning for  infection versus malignancy.  He has been having worsening shortness of breath, cough, hemoptysis on presentation.  Pulmonary consulted and following.  Legionella negative. Histoplasma antigen negative. Fungitell beta D glucan negative. Blastomyces antigen negative as well - Given history of latent TB, he underwent AFB, now negative x 3 - Has been placed on Zosyn , continue.  Appreciate pulmonary follow-up, - Status post bronchoscopy 8/19 with cultures showing some yeast; appreciate pulmonary recommendations. -Noted to have worsening leukocytosis likely in the setting of cancer.  No fever noted.  Continue CBC monitoring. -Noted to have some mild COPD exacerbation for which prednisone  has been initiated by pulmonology on 8/22.   Active problems Concern for primary lung cancer -given left upper lobe mass, significant hypercalcemia there is a good likelihood of malignancy.  CT abdomen pelvis with contrast did show a left lateral psoas nodule concerning for metastasis - Postthoracentesis on Wednesday, cytology pending, but underwent bronchoscopy with initial path concerning for squamous cell carcinoma.  Oncology consulted.   Hypercalcemia of malignancy-over 15 on presentation.  PTH appropriately low, PTH RP pending.  Received fluids, Lasix , calcium  now is better, 9.0 this morning.  Monitor moving forward   Acute kidney injury-resolved, creatinine back to baseline   Pulmonary fibrosis, emphysema-contributing to his dyspnea   Persistent A-fib-continue dofetilide , on heparin .  Appears to be on Xarelto  at home.  Anticipate resuming Xarelto  by 8/23 once it is determined that no further procedures are needed.   Concern for severe sepsis-sepsis physiology improved.  Likely due to his possible upper lobe consolidation.  Continue antibiotics.  Noted to have worsening leukocytosis, monitor.   Bipolar 1 disorder-continue home regimen  DVT prophylaxis: Heparin  drip Code Status: Full Family Communication:  None at bedside Disposition Plan: Plan for SNF on discharge. Status is: Inpatient Remains inpatient appropriate because: Need for IV antibiotics.   Consultants:  Pulmonology Oncology  Procedures:  Bronchoscopy with biopsies on 8/19  Antimicrobials:  Anti-infectives (From admission, onward)    Start     Dose/Rate Route Frequency Ordered Stop   02/08/24 1645  piperacillin -tazobactam (ZOSYN ) IVPB 3.375 g       Note to Pharmacy: Please change dosing if needed.   3.375 g 12.5 mL/hr over 240 Minutes Intravenous Every 8 hours 02/08/24 1549     02/05/24 1800  doxycycline  (VIBRA -TABS) tablet 100 mg        100 mg Oral Every 12 hours 02/05/24 1428 02/09/24 1721   02/04/24 1700  azithromycin  (ZITHROMAX ) 500 mg in sodium chloride  0.9 % 250 mL IVPB  Status:  Discontinued        500 mg 250 mL/hr over 60 Minutes Intravenous Every 24 hours 02/03/24 1945 02/03/24 2145   02/04/24 1600  cefTRIAXone  (ROCEPHIN ) 1 g in sodium chloride  0.9 % 100 mL IVPB  Status:  Discontinued        1 g 200 mL/hr over 30 Minutes Intravenous Every 24 hours 02/03/24 1945 02/08/24 1549   02/04/24 1600  doxycycline  (VIBRAMYCIN ) 100 mg in sodium chloride  0.9 % 250 mL IVPB  Status:  Discontinued        100 mg 125 mL/hr over 120 Minutes Intravenous Every 12 hours 02/03/24 2145 02/05/24 1428   02/03/24 1645  cefTRIAXone  (ROCEPHIN ) 2 g in sodium chloride  0.9 % 100 mL IVPB  Status:  Discontinued        2 g 200 mL/hr over 30 Minutes Intravenous Once 02/03/24 1643 02/03/24 1643   02/03/24 1645  azithromycin  (ZITHROMAX ) 500 mg in sodium chloride  0.9 % 250 mL IVPB  Status:  Discontinued        500 mg 250 mL/hr over 60 Minutes Intravenous  Once 02/03/24 1643 02/03/24 1643   02/03/24 1615  cefTRIAXone  (ROCEPHIN ) 1 g in sodium chloride  0.9 % 100 mL IVPB        1 g 200 mL/hr over 30 Minutes Intravenous  Once 02/03/24 1604 02/03/24 1656   02/03/24 1615  azithromycin  (ZITHROMAX ) 500 mg in sodium chloride  0.9 % 250 mL IVPB        500  mg 250 mL/hr over 60 Minutes Intravenous  Once 02/03/24 1604 02/03/24 1801       Subjective: Patient seen and evaluated today with no new acute complaints or concerns. No acute concerns or events noted overnight.  Denies any chest pain or shortness of breath, but noted to have worsening leukocytosis.  Objective: Vitals:   02/19/24 0451 02/19/24 0720 02/19/24 1211 02/19/24 1516  BP: 123/78 112/84 (!) 116/59 108/66  Pulse: 85 88 100 98  Resp: (!) 21 19 18    Temp: 98.5 F (36.9 C) 98.7 F (37.1 C) 98.7 F (37.1 C) 97.7 F (36.5 C)  TempSrc: Oral Oral Oral Oral  SpO2: 90% 90% 96% 90%  Weight: 73.1 kg     Height:        Intake/Output Summary (Last 24 hours) at 02/19/2024 1536 Last data filed at 02/19/2024 0005 Gross per 24 hour  Intake 100 ml  Output 500 ml  Net -400 ml   Filed Weights   02/16/24 0458 02/18/24 0307 02/19/24 0451  Weight: 71 kg 73.6 kg 73.1 kg    Examination:  General exam: Appears  calm and comfortable  Respiratory system: Clear to auscultation. Respiratory effort normal.  On nasal cannula. Cardiovascular system: S1 & S2 heard, RRR.  Gastrointestinal system: Abdomen is soft Central nervous system: Alert and awake Extremities: No edema Skin: No significant lesions noted Psychiatry: Flat affect.    Data Reviewed: I have personally reviewed following labs and imaging studies  CBC: Recent Labs  Lab 02/15/24 0421 02/16/24 0349 02/17/24 0626 02/18/24 0311 02/19/24 0246  WBC 22.9* 21.1* 33.3* 45.1* 42.8*  NEUTROABS  --   --   --   --  37.9*  HGB 8.2* 7.9* 8.6* 8.6* 9.3*  HCT 25.6* 24.8* 27.2* 27.7* 29.3*  MCV 80.5 80.8 81.2 82.0 80.7  PLT 314 293 368 422* 440*   Basic Metabolic Panel: Recent Labs  Lab 02/15/24 0421 02/16/24 0349 02/17/24 0626 02/18/24 0311 02/19/24 0246  NA 134* 136 137 135 137  K 4.3 3.7 4.9 4.5 4.2  CL 106 108 105 104 105  CO2 19* 20* 20* 19* 21*  GLUCOSE 106* 120* 129* 119* 105*  BUN 6* 7* 12 15 11   CREATININE  0.90 1.01 0.91 0.84 0.79  CALCIUM  8.4* 8.8* 9.2 9.1 9.0  MG 2.0 1.6* 2.2 1.8 1.9   GFR: Estimated Creatinine Clearance: 86.3 mL/min (by C-G formula based on SCr of 0.79 mg/dL). Liver Function Tests: Recent Labs  Lab 02/14/24 0253 02/15/24 0421 02/16/24 0349  AST 35 29 30  ALT 23 22 20   ALKPHOS 112 108 115  BILITOT 0.5 0.3 <0.2  PROT 5.5* 5.3* 4.9*  ALBUMIN  1.8* 1.7* 1.5*   No results for input(s): LIPASE, AMYLASE in the last 168 hours. No results for input(s): AMMONIA in the last 168 hours. Coagulation Profile: No results for input(s): INR, PROTIME in the last 168 hours. Cardiac Enzymes: No results for input(s): CKTOTAL, CKMB, CKMBINDEX, TROPONINI in the last 168 hours. BNP (last 3 results) No results for input(s): PROBNP in the last 8760 hours. HbA1C: No results for input(s): HGBA1C in the last 72 hours. CBG: No results for input(s): GLUCAP in the last 168 hours.  Lipid Profile: No results for input(s): CHOL, HDL, LDLCALC, TRIG, CHOLHDL, LDLDIRECT in the last 72 hours. Thyroid  Function Tests: No results for input(s): TSH, T4TOTAL, FREET4, T3FREE, THYROIDAB in the last 72 hours. Anemia Panel: No results for input(s): VITAMINB12, FOLATE, FERRITIN, TIBC, IRON, RETICCTPCT in the last 72 hours. Sepsis Labs: Recent Labs  Lab 02/18/24 1741  PROCALCITON 0.19    Recent Results (from the past 240 hours)  Body fluid culture w Gram Stain     Status: None   Collection Time: 02/10/24  1:32 PM   Specimen: Pleural Fluid  Result Value Ref Range Status   Specimen Description PLEURAL  Final   Special Requests NONE  Final   Gram Stain   Final    WBC PRESENT, PREDOMINANTLY MONONUCLEAR NO ORGANISMS SEEN CYTOSPIN SMEAR    Culture   Final    NO GROWTH 3 DAYS Performed at [facility], [address] [place], [place] [address]    Report Status 02/13/2024 FINAL  Final  Culture, BAL-quantitative w Gram Stain      Status: Abnormal (Preliminary result)   Collection Time: 02/16/24  3:46 PM   Specimen: Bronchial Alveolar Lavage; Respiratory  Result Value Ref Range Status   Specimen Description BRONCHIAL ALVEOLAR LAVAGE  Final   Special Requests LUL  Final   Gram Stain   Final    FEW WBC PRESENT, PREDOMINANTLY PMN NO ORGANISMS SEEN  Culture (A)  Final    20,000 COLONIES/mL YEAST IDENTIFICATION TO FOLLOW Performed at [facility], [address] [place], [place] [address]    Report Status PENDING  Incomplete  Acid Fast Smear (AFB)     Status: None   Collection Time: 02/16/24  3:46 PM   Specimen: Bronchial Alveolar Lavage; Respiratory  Result Value Ref Range Status   AFB Specimen Processing Concentration  Final   Acid Fast Smear Negative  Final    Comment: (NOTE) Performed At: [facility] [address] [place], [place] [id number] Jennette Shorter MD Ey:[id number]    Source (AFB) BRONCHIAL ALVEOLAR LAVAGE  Final    Comment: LUL Performed at [facility], [address] [place], [place] [address]   Anaerobic culture w Gram Stain     Status: None (Preliminary result)   Collection Time: 02/16/24  3:46 PM   Specimen: Bronchoalveolar Lavage  Result Value Ref Range Status   Specimen Description BRONCHIAL ALVEOLAR LAVAGE  Final   Special Requests LUL  Final   Gram Stain   Final    FEW WBC PRESENT, PREDOMINANTLY PMN NO ORGANISMS SEEN Performed at [facility], [address], [place], [place] [address]    Culture   Final    NO ANAEROBES ISOLATED; CULTURE IN PROGRESS FOR 5 DAYS   Report Status PENDING  Incomplete         Radiology Studies: No results found.       Scheduled Meds:  budesonide -glycopyrrolate -formoterol   2 puff Inhalation BID   dofetilide   500 mcg Oral BID   gabapentin   300 mg Oral TID   guaiFENesin -dextromethorphan   5 mL Oral Once   polyethylene glycol  17 g Oral Daily   predniSONE   40 mg Oral Q breakfast   pyridOXINE   100 mg Oral  QPM   Continuous Infusions:  sodium chloride  75 mL/hr at 02/19/24 0603   heparin  1,900 Units/hr (02/19/24 1525)   piperacillin -tazobactam 3.375 g (02/19/24 1123)     LOS: 16 days    Time spent: 55 minutes    Irish Piech JONETTA Fairly, DO [facility]  If 7PM-7AM, please contact night-coverage [email or web address] 02/19/2024, 3:36 PM

## 2024-02-19 NOTE — Consult Note (Addendum)
     ADMISSION DATE:  02/03/2024  SUBJECTIVE: ON [place]. Having some sob. Wheezing b/l. Some sob. Denies any more symptoms.   REVIEW OF SYSTEMS:   Pertinent ROS as per HPI  VITAL SIGNS: Temp:  [97.8 F (36.6 C)-98.7 F (37.1 C)] 98.7 F (37.1 C) (08/22 1211) Pulse Rate:  [85-118] 100 (08/22 1211) Resp:  [11-28] 18 (08/22 1211) BP: (104-141)/(59-84) 116/59 (08/22 1211) SpO2:  [90 %-97 %] 96 % (08/22 1211) Weight:  [73.1 kg] 73.1 kg (08/22 0451)  PHYSICAL EXAMINATION: General: cachetic male w some resp distress.  Lungs: wheezing b/l.  Heart: regular rate rhythm, no murmur appreciated.  Abdomen: non tender, non distended. Normal BS.  Neuro: axox3. Moving all extremities.  Recent Labs  Lab 02/17/24 0626 02/18/24 0311 02/19/24 0246  NA 137 135 137  K 4.9 4.5 4.2  CL 105 104 105  CO2 20* 19* 21*  BUN 12 15 11   CREATININE 0.91 0.84 0.79  GLUCOSE 129* 119* 105*   Recent Labs  Lab 02/17/24 0626 02/18/24 0311 02/19/24 0246  HGB 8.6* 8.6* 9.3*  HCT 27.2* 27.7* 29.3*  WBC 33.3* 45.1* 42.8*  PLT 368 422* 440*   No results found.  STUDIES:  CXR/CT scan/ECHO  CXR is similar to before w left effusion and LUL opacity.   ASSESSMENT / PLAN:   73 year old male with a past medical history of limited systemic sclerosis, COPD, ILD, A-fib, latent TB who is presenting to the hospital for further evaluation of syncopal episodes associated with tachycardia, dizziness, lightheadedness and unresolved pneumonia.  Dx w LTBI in 2021, not treated. Screened prior to initiation of Imuran.    Smoker. 1 pk/day. Almost 50 years.    P/w cough x 1 month w 20 kg weight loss.    Imaging  CT chest Aug 2025: reviewed by me. Has a LUL mass measuring 7.5x 5.6 cm. Additional nodule in the LLL. 4L station LN seen. Underlying basal and paraseptal bronchieclatasis and fibrotic changes. No significant GGO.  CT LDCT 2022: did not show LUL mass.  CT July 2025: mass in LUL 4.6x6 cm. LLL smaller than Aug.   CT abdomen showing left psoas muscle nodule. IR can do biopsy if needed. Also, worsening left sided effusion seen compared to prior CT chest.   - s/p thoracentesis on 8/13. Removal of 1600 cc. Cytology pending.  - s/p ion guided biopsy on 8/19.  - has been on abx since 8/6. Initial leukocytosis likely infectious vs leukamoid reaction. However, worsening WBC post bronch could be related to seeding of infection Finish 5 days of abx from 8/19. Prior infectious w/u neg.  - yeast in BAL is likely colonization. He has been on steroid inhaler. No need for antifungal. Fungal studies have been neg.  - discussed with the patient the finding of NSCLC seen on LUL mass. Discussed with him options and next steps. Please consult oncology.  - Rx for COPD E. Adding prednisone  40mg  x 5 days. Can cont breztri . Poor technique where he was pumping directed at mouth but far away from mouth w/o inhalation. Proper technique discussed.   Pulm will sign off. Please call with questions.  Time spent 50 min.    Bradley JONETTA Fredericks, MD Pulmonary, Critical Care & Sleep Medicine [facility] Pager: [phone]  02/19/2024, 2:59 PM

## 2024-02-19 NOTE — TOC Progression Note (Signed)
 Transition of Care [facility]) - Progression Note    Patient Details  Name: Bradley Ferguson MRN: [id number] Date of Birth: [birth year]  Transition of Care [facility]) CM/SW Contact  [name], [place] Phone Number: 02/19/2024, 12:46 PM  Clinical Narrative:   Per daily meeting with treatment team, patient is not medially ready. CSW to submit for insurance auth closer to patient being medically stable.   CSW will continue to follow.    Expected Discharge Plan: Skilled Nursing Facility Barriers to Discharge: Continued Medical Work up, SNF Pending bed offer, Insurance Authorization               Expected Discharge Plan and Services In-house Referral: Clinical Social Work     Living arrangements for the past 2 months: Single Family Home                                       Social Drivers of Health (SDOH) Interventions SDOH Screenings   Food Insecurity: Food Insecurity Present (02/03/2024)  Housing: Low Risk  (02/03/2024)  Transportation Needs: No Transportation Needs (02/03/2024)  Utilities: Not At Risk (02/03/2024)  Alcohol Screen: Low Risk  (12/29/2023)  Depression (PHQ2-9): Medium Risk (11/13/2023)  Financial Resource Strain: Medium Risk (12/29/2023)  Physical Activity: Inactive (12/29/2023)  Social Connections: Socially Isolated (02/03/2024)  Stress: Stress Concern Present (12/29/2023)  Tobacco Use: High Risk (02/16/2024)  Health Literacy: Adequate Health Literacy (11/13/2023)    Readmission Risk Interventions    02/04/2024    3:23 PM 11/10/2023    2:54 PM  Readmission Risk Prevention Plan  Post Dischage Appt  Complete  Medication Screening  Complete  Transportation Screening Complete Complete  PCP or Specialist Appt within 5-7 Days Complete   Home Care Screening Complete   Medication Review (RN CM) Complete

## 2024-02-19 NOTE — Progress Notes (Signed)
 PHARMACY - ANTICOAGULATION CONSULT NOTE  Pharmacy Consult for Heparin  Indication: atrial fibrillation  Allergies  Allergen Reactions   Cymbalta  [Duloxetine  Hcl]     unknown   Effexor [Venlafaxine]     Feels bad   Wellbutrin  [Bupropion ]     sleepy   Zoloft [Sertraline Hcl]     Felt bad    Patient Measurements: Height: 6' 0.99 (185.4 cm) Weight: 73.1 kg (161 lb 2.5 oz) IBW/kg (Calculated) : 79.88 HEPARIN  DW (KG): 69.8  Vital Signs: Temp: 98.7 F (37.1 C) (08/22 1211) Temp Source: Oral (08/22 1211) BP: 116/59 (08/22 1211) Pulse Rate: 100 (08/22 1211)  Labs: Recent Labs    02/17/24 0626 02/17/24 9167 02/18/24 0311 02/18/24 0746 02/19/24 0246  HGB 8.6*  --  8.6*  --  9.3*  HCT 27.2*  --  27.7*  --  29.3*  PLT 368  --  422*  --  440*  HEPARINUNFRC  --  0.40  --  0.41 0.29*  CREATININE 0.91  --  0.84  --  0.79    Estimated Creatinine Clearance: 86.3 mL/min (by C-G formula based on SCr of 0.79 mg/dL).  Assessment: 73 y.o. male with h/o Afib, PTA Xarelto  on hold (patient reported not taking anymore but fill history shows recent fills). Pharmacy has been consulted to dose IV heparin .   S/p bronch and biopsy 8/19 afternoon. Per Dr .Shelah, okay to resume heparin  in 8 hours (0000 8/20).   Heparin  level is slightly subtherapeutic (0.29) on 1850 units/hr. CBC stable.  No known issues with IV infusion.   Goal of Therapy:  Heparin  level 0.3-0.7 units/ml Monitor platelets by anticoagulation protocol: Yes   Plan:  Increase IV Heparin  to 1900 units/hr Daily heparin  level and CBC. Monitor for signs/symptoms of bleeding. F/u resuming Xarelto  when able.  Harlene Barlow, [place] JONETTA CORP, BCCP Clinical Pharmacist  02/19/2024 2:53 PM   [facility] pharmacy phone numbers are listed on [email or web address]

## 2024-02-20 DIAGNOSIS — R918 Other nonspecific abnormal finding of lung field: Secondary | ICD-10-CM | POA: Diagnosis not present

## 2024-02-20 LAB — CBC
HCT: 26.2 % — ABNORMAL LOW (ref 39.0–52.0)
Hemoglobin: 8.4 g/dL — ABNORMAL LOW (ref 13.0–17.0)
MCH: 25.8 pg — ABNORMAL LOW (ref 26.0–34.0)
MCHC: 32.1 g/dL (ref 30.0–36.0)
MCV: 80.6 fL (ref 80.0–100.0)
Platelets: 434 K/uL — ABNORMAL HIGH (ref 150–400)
RBC: 3.25 MIL/uL — ABNORMAL LOW (ref 4.22–5.81)
RDW: 21.2 % — ABNORMAL HIGH (ref 11.5–15.5)
WBC: 38.2 K/uL — ABNORMAL HIGH (ref 4.0–10.5)
nRBC: 0 % (ref 0.0–0.2)

## 2024-02-20 LAB — BASIC METABOLIC PANEL WITH GFR
Anion gap: 7 (ref 5–15)
BUN: 13 mg/dL (ref 8–23)
CO2: 23 mmol/L (ref 22–32)
Calcium: 9 mg/dL (ref 8.9–10.3)
Chloride: 104 mmol/L (ref 98–111)
Creatinine, Ser: 0.82 mg/dL (ref 0.61–1.24)
GFR, Estimated: >60 mL/min (ref >60)
Glucose, Bld: 138 mg/dL — ABNORMAL HIGH (ref 70–99)
Potassium: 4.1 mmol/L (ref 3.5–5.1)
Sodium: 134 mmol/L — ABNORMAL LOW (ref 135–145)

## 2024-02-20 LAB — HEPARIN LEVEL (UNFRACTIONATED): Heparin Unfractionated: 0.28 [IU]/mL — ABNORMAL LOW (ref 0.30–0.70)

## 2024-02-20 LAB — MAGNESIUM: Magnesium: 1.9 mg/dL (ref 1.7–2.4)

## 2024-02-20 MED ORDER — RIVAROXABAN 20 MG PO TABS
20.0000 mg | ORAL_TABLET | Freq: Every day | ORAL | Status: DC
  Administered 2024-02-20 – 2024-02-28 (×9): 20 mg via ORAL
  Filled 2024-02-20 (×9): qty 1

## 2024-02-20 NOTE — Telephone Encounter (Signed)
 Patient was scheduled for ion robot on 02/16/24

## 2024-02-20 NOTE — TOC Progression Note (Signed)
 Transition of Care [facility]) - Progression Note    Patient Details  Name: Bradley Ferguson MRN: [id number] Date of Birth: 01-25-1951  Transition of Care [facility]) CM/SW Contact  Isaiah Public, LCSWA Phone Number: 02/20/2024, 11:03 AM  Clinical Narrative:     CSW following to start insurance authorization closer to patient being medically ready for dc.CSW will continue to follow and assist with patients dc planning needs.   Expected Discharge Plan: Skilled Nursing Facility Barriers to Discharge: Continued Medical Work up, SNF Pending bed offer, Insurance Authorization               Expected Discharge Plan and Services In-house Referral: Clinical Social Work     Living arrangements for the past 2 months: Single Family Home                                       Social Drivers of Health (SDOH) Interventions SDOH Screenings   Food Insecurity: Food Insecurity Present (02/03/2024)  Housing: Low Risk  (02/03/2024)  Transportation Needs: No Transportation Needs (02/03/2024)  Utilities: Not At Risk (02/03/2024)  Alcohol Screen: Low Risk  (12/29/2023)  Depression (PHQ2-9): Medium Risk (11/13/2023)  Financial Resource Strain: Medium Risk (12/29/2023)  Physical Activity: Inactive (12/29/2023)  Social Connections: Socially Isolated (02/03/2024)  Stress: Stress Concern Present (12/29/2023)  Tobacco Use: High Risk (02/16/2024)  Health Literacy: Adequate Health Literacy (11/13/2023)    Readmission Risk Interventions    02/04/2024    3:23 PM 11/10/2023    2:54 PM  Readmission Risk Prevention Plan  Post Dischage Appt  Complete  Medication Screening  Complete  Transportation Screening Complete Complete  PCP or Specialist Appt within 5-7 Days Complete   Home Care Screening Complete   Medication Review (RN CM) Complete

## 2024-02-20 NOTE — Progress Notes (Signed)
 PHARMACY - ANTICOAGULATION CONSULT NOTE  Pharmacy Consult for Heparin  Indication: atrial fibrillation  Allergies  Allergen Reactions   Cymbalta  [Duloxetine  Hcl]     unknown   Effexor [Venlafaxine]     Feels bad   Wellbutrin  [Bupropion ]     sleepy   Zoloft [Sertraline Hcl]     Felt bad    Patient Measurements: Height: 6' 0.99 (185.4 cm) Weight: 72.7 kg (160 lb 4.4 oz) IBW/kg (Calculated) : 79.88 HEPARIN  DW (KG): 69.8  Vital Signs: Temp: 97.9 F (36.6 C) (08/23 0800) Temp Source: Oral (08/23 0800) BP: 140/57 (08/23 0800) Pulse Rate: 103 (08/23 0800)  Labs: Recent Labs    02/18/24 0311 02/18/24 0746 02/19/24 0246 02/20/24 0246  HGB 8.6*  --  9.3* 8.4*  HCT 27.7*  --  29.3* 26.2*  PLT 422*  --  440* 434*  HEPARINUNFRC  --  0.41 0.29* 0.28*  CREATININE 0.84  --  0.79 0.82    Estimated Creatinine Clearance: 83.7 mL/min (by C-G formula based on SCr of 0.82 mg/dL).  Assessment: 73 y.o. male with h/o Afib, PTA Xarelto  on hold (patient reported not taking anymore but fill history shows recent fills). Pharmacy has been consulted to dose IV heparin .   S/p bronch and biopsy 8/19 afternoon. Per Dr .Shelah, okay to resume heparin  in 8 hours (0000 8/20).   Heparin  level is slightly subtherapeutic (0.28) on 1900 units/hr. CBC stable.  No known issues with IV infusion noted per RN.   Goal of Therapy:  Heparin  level 0.3-0.7 units/ml Monitor platelets by anticoagulation protocol: Yes   Plan:  Increase IV Heparin  to 2000 units/hr Daily heparin  level and CBC. Monitor for signs/symptoms of bleeding. F/u resuming Xarelto  when able.  R. Samual Satterfield, PharmD PGY-1 Acute Care Pharmacy Resident [facility] 02/20/2024 8:10 AM  [facility] pharmacy phone numbers are listed on [email or web address]

## 2024-02-20 NOTE — Plan of Care (Signed)
  Problem: Education: Goal: Knowledge of General Education information will improve Description: Including pain rating scale, medication(s)/side effects and non-pharmacologic comfort measures Outcome: Progressing   Problem: Health Behavior/Discharge Planning: Goal: Ability to manage health-related needs will improve Outcome: Progressing   Problem: Clinical Measurements: Goal: Ability to maintain clinical measurements within normal limits will improve Outcome: Progressing Goal: Will remain free from infection Outcome: Progressing Goal: Diagnostic test results will improve Outcome: Progressing Goal: Respiratory complications will improve Outcome: Progressing Goal: Cardiovascular complication will be avoided Outcome: Progressing   Problem: Activity: Goal: Risk for activity intolerance will decrease Outcome: Progressing   Problem: Nutrition: Goal: Adequate nutrition will be maintained Outcome: Progressing   Problem: Coping: Goal: Level of anxiety will decrease Outcome: Progressing   Problem: Elimination: Goal: Will not experience complications related to bowel motility Outcome: Progressing Goal: Will not experience complications related to urinary retention Outcome: Progressing   Problem: Pain Managment: Goal: General experience of comfort will improve and/or be controlled Outcome: Progressing   Problem: Safety: Goal: Ability to remain free from injury will improve Outcome: Progressing   Problem: Skin Integrity: Goal: Risk for impaired skin integrity will decrease Outcome: Progressing   Problem: Activity: Goal: Ability to tolerate increased activity will improve Outcome: Progressing   Problem: Clinical Measurements: Goal: Ability to maintain a body temperature in the normal range will improve Outcome: Progressing

## 2024-02-20 NOTE — Progress Notes (Signed)
 PROGRESS NOTE    Bradley Ferguson  FMW:[id number] DOB: May 22, 1951 DOA: 02/03/2024 PCP: Bradley Beverley NOVAK, MD  Chief Complaint  Patient presents with   Pneumonia    Brief Narrative:   73 y.o. male with medical history significant of patient with history of COPD, interstitial lung disease, patient has latent tuberculosis, atrial fibrillation on Tikosyn  and Xarelto , peripheral arterial disease, essential hypertension, anxiety disorder depression, history of hematuria with lung consolidation was on antibiotics by pulmonary.  Patient went to see pulmonary today due to near syncope and productive cough.  He was found to have significant dyspnea and tachycardia with heart rate in the 120s.  Patient noted to have oxygen saturation 90% on room air.  He was however noted to have hypercalcemia, leukocytosis with left shift and lactic acidosis.  He was admitted for acute hypoxemic respiratory failure in the setting of left upper lobe consolidation and underwent bronchoscopy on 8/19.  Now noted to have worsening leukocytosis and remains on IV antibiotics with BAL showing 20,000 colonies of yeast. He continues to have some worsening leukocytosis that is likely related to new cancer diagnosis with concern for NSCLC.  Started on prednisone  per pulmonology with recommendations to continue antibiotics for 3 more days.  Oncology consulted for further evaluation.   Assessment & Plan:   Principal Problem:   Mass of upper lobe of left lung Active Problems:   ANXIETY DEPRESSION   Bipolar I disorder (HCC)   Atrial fibrillation (HCC)   Peripheral arterial disease (HCC)   Chronic obstructive pulmonary disease (HCC)   Interstitial pulmonary disease (HCC)   TB lung, latent   Essential hypertension   Severe sepsis (HCC)   Hypercalcemia   Mediastinal adenopathy  Acute hypoxic respiratory failure in the setting of left upper lobe consolidation -CT scan on 8/1 showed slightly increase of the left upper lobe dense  consolidation, concerning for infection versus malignancy.  He has been having worsening shortness of breath, cough, hemoptysis on presentation.  Pulmonary consulted and following.  Legionella negative. Histoplasma antigen negative. Fungitell beta D glucan negative. Blastomyces antigen negative as well - Given history of latent TB, he underwent AFB, now negative x 3 - Has been placed on Zosyn , continue.  Plan to continue another 5 days from 8/19.   Appreciate pulmonary follow-up, - Status post bronchoscopy 8/19 with cultures showing some yeast; appreciate pulmonary recommendations - thought to be colonizer, no antifungal needed.   -Noted to have worsening leukocytosis likely in the setting of cancer.  No fever noted.  Continue CBC monitoring. -Noted to have some mild COPD exacerbation for which prednisone  has been initiated by pulmonology on 8/22.  Stage IV Lung Cancer Surg path with non small cell carcinoma CT chest with mass like consolidation in left upper lobe, prominent mediastinal LN's.  CT abd with mediastinal LAD, increased in size.  Heterogenously enhancing nodule lateral to L psoas concerning for metastatic disease.   -given left upper lobe mass, significant hypercalcemia there is Bradley Ferguson good likelihood of malignancy.  CT abdomen pelvis with contrast did show Bradley Ferguson left lateral psoas nodule concerning for metastasis.  Ill defined hypoattentuating focus adjacent to falciform ligament in hepatic segment 4 (typical location for fatty infiltration, but new from 05/2023, indeterminate).   Seen by Dr. Timmy, right now planning for hospice it seems.  Planning to send tumor for molecular analysis.     Hypercalcemia of malignancy-over 15 on presentation.  PTH appropriately low, PTH RP less than 2.   Received fluids, Lasix  resolved  Acute kidney injury-resolved, creatinine back to baseline   Pulmonary fibrosis, emphysema-contributing to his dyspnea   Persistent Bradley Ferguson-continue dofetilide .  Transition  to xarelto  from heparin  today.   Concern for severe sepsis sepsis physiology resolved.   Likely due to his possible upper lobe consolidation.  Continue antibiotics.  Noted to have worsening leukocytosis, monitor.   Bipolar 1 disorder-continue home regimen     DVT prophylaxis: heparin  gtt Code Status: DNR Family Communication: none Disposition:   Status is: Inpatient Remains inpatient appropriate because: need for continued inpatient care   Consultants:  Pulm oncology  Procedures:  Thoracentesis  Flexible video fiberoptic bronchoscopy with endobronchial US  and biopsies  Antimicrobials:  Anti-infectives (From admission, onward)    Start     Dose/Rate Route Frequency Ordered Stop   02/08/24 1645  piperacillin -tazobactam (ZOSYN ) IVPB 3.375 g       Note to Pharmacy: Please change dosing if needed.   3.375 g 12.5 mL/hr over 240 Minutes Intravenous Every 8 hours 02/08/24 1549     02/05/24 1800  doxycycline  (VIBRA -TABS) tablet 100 mg        100 mg Oral Every 12 hours 02/05/24 1428 02/09/24 1721   02/04/24 1700  azithromycin  (ZITHROMAX ) 500 mg in sodium chloride  0.9 % 250 mL IVPB  Status:  Discontinued        500 mg 250 mL/hr over 60 Minutes Intravenous Every 24 hours 02/03/24 1945 02/03/24 2145   02/04/24 1600  cefTRIAXone  (ROCEPHIN ) 1 g in sodium chloride  0.9 % 100 mL IVPB  Status:  Discontinued        1 g 200 mL/hr over 30 Minutes Intravenous Every 24 hours 02/03/24 1945 02/08/24 1549   02/04/24 1600  doxycycline  (VIBRAMYCIN ) 100 mg in sodium chloride  0.9 % 250 mL IVPB  Status:  Discontinued        100 mg 125 mL/hr over 120 Minutes Intravenous Every 12 hours 02/03/24 2145 02/05/24 1428   02/03/24 1645  cefTRIAXone  (ROCEPHIN ) 2 g in sodium chloride  0.9 % 100 mL IVPB  Status:  Discontinued        2 g 200 mL/hr over 30 Minutes Intravenous Once 02/03/24 1643 02/03/24 1643   02/03/24 1645  azithromycin  (ZITHROMAX ) 500 mg in sodium chloride  0.9 % 250 mL IVPB  Status:  Discontinued         500 mg 250 mL/hr over 60 Minutes Intravenous  Once 02/03/24 1643 02/03/24 1643   02/03/24 1615  cefTRIAXone  (ROCEPHIN ) 1 g in sodium chloride  0.9 % 100 mL IVPB        1 g 200 mL/hr over 30 Minutes Intravenous  Once 02/03/24 1604 02/03/24 1656   02/03/24 1615  azithromycin  (ZITHROMAX ) 500 mg in sodium chloride  0.9 % 250 mL IVPB        500 mg 250 mL/hr over 60 Minutes Intravenous  Once 02/03/24 1604 02/03/24 1801       Subjective: No new complaints  Objective: Vitals:   02/20/24 0047 02/20/24 0327 02/20/24 0800 02/20/24 0823  BP: (!) 145/99  (!) 140/57   Pulse: (!) 110 82 (!) 103   Resp: 18  17   Temp: 98.1 F (36.7 C) 97.9 F (36.6 C) 97.9 F (36.6 C)   TempSrc: Oral Oral Oral   SpO2: 92%  94% 96%  Weight:  72.7 kg    Height:        Intake/Output Summary (Last 24 hours) at 02/20/2024 0943 Last data filed at 02/20/2024 0800 Gross per 24 hour  Intake 260 ml  Output 425 ml  Net -165 ml   Filed Weights   02/18/24 0307 02/19/24 0451 02/20/24 0327  Weight: 73.6 kg 73.1 kg 72.7 kg    Examination:  General exam: Appears calm and comfortable  Respiratory system: unlabored Cardiovascular system: RRR Gastrointestinal system: Abdomen is nondistended, soft and nontender. Central nervous system: Alert and oriented. No focal neurological deficits. Extremities: no lee   Data Reviewed: I have personally reviewed following labs and imaging studies  CBC: Recent Labs  Lab 02/16/24 0349 02/17/24 0626 02/18/24 0311 02/19/24 0246 02/20/24 0246  WBC 21.1* 33.3* 45.1* 42.8* 38.2*  NEUTROABS  --   --   --  37.9*  --   HGB 7.9* 8.6* 8.6* 9.3* 8.4*  HCT 24.8* 27.2* 27.7* 29.3* 26.2*  MCV 80.8 81.2 82.0 80.7 80.6  PLT 293 368 422* 440* 434*    Basic Metabolic Panel: Recent Labs  Lab 02/16/24 0349 02/17/24 0626 02/18/24 0311 02/19/24 0246 02/20/24 0246  NA 136 137 135 137 134*  K 3.7 4.9 4.5 4.2 4.1  CL 108 105 104 105 104  CO2 20* 20* 19* 21* 23  GLUCOSE  120* 129* 119* 105* 138*  BUN 7* 12 15 11 13   CREATININE 1.01 0.91 0.84 0.79 0.82  CALCIUM  8.8* 9.2 9.1 9.0 9.0  MG 1.6* 2.2 1.8 1.9 1.9    GFR: Estimated Creatinine Clearance: 83.7 mL/min (by C-G formula based on SCr of 0.82 mg/dL).  Liver Function Tests: Recent Labs  Lab 02/14/24 0253 02/15/24 0421 02/16/24 0349  AST 35 29 30  ALT 23 22 20   ALKPHOS 112 108 115  BILITOT 0.5 0.3 <0.2  PROT 5.5* 5.3* 4.9*  ALBUMIN  1.8* 1.7* 1.5*    CBG: No results for input(s): GLUCAP in the last 168 hours.   Recent Results (from the past 240 hours)  Body fluid culture w Gram Stain     Status: None   Collection Time: 02/10/24  1:32 PM   Specimen: Pleural Fluid  Result Value Ref Range Status   Specimen Description PLEURAL  Final   Special Requests NONE  Final   Gram Stain   Final    WBC PRESENT, PREDOMINANTLY MONONUCLEAR NO ORGANISMS SEEN CYTOSPIN SMEAR    Culture   Final    NO GROWTH 3 DAYS Performed at [facility], [address] [place], [place] [address]    Report Status 02/13/2024 FINAL  Final  Culture, BAL-quantitative w Gram Stain     Status: Abnormal   Collection Time: 02/16/24  3:46 PM   Specimen: Bronchial Alveolar Lavage; Respiratory  Result Value Ref Range Status   Specimen Description BRONCHIAL ALVEOLAR LAVAGE  Final   Special Requests LUL  Final   Gram Stain   Final    FEW WBC PRESENT, PREDOMINANTLY PMN NO ORGANISMS SEEN Performed at [facility], [address] [place], [place] [address]    Culture 20,000 COLONIES/mL CANDIDA ALBICANS (Tyton Abdallah)  Final   Report Status 02/19/2024 FINAL  Final  Acid Fast Smear (AFB)     Status: None   Collection Time: 02/16/24  3:46 PM   Specimen: Bronchial Alveolar Lavage; Respiratory  Result Value Ref Range Status   AFB Specimen Processing Concentration  Final   Acid Fast Smear Negative  Final    Comment: (NOTE) Performed At: [facility] [address] [place], [place] [id number] Jennette Shorter MD  Ey:[phone]    Source (AFB) BRONCHIAL ALVEOLAR LAVAGE  Final    Comment: LUL Performed at  [facility], [address], [place], [place] [address]   Anaerobic culture w Gram Stain     Status: None (Preliminary result)   Collection Time: 02/16/24  3:46 PM   Specimen: Bronchoalveolar Lavage  Result Value Ref Range Status   Specimen Description BRONCHIAL ALVEOLAR LAVAGE  Final   Special Requests LUL  Final   Gram Stain   Final    FEW WBC PRESENT, PREDOMINANTLY PMN NO ORGANISMS SEEN Performed at [facility], [address] [place], [place] [address]    Culture   Final    NO ANAEROBES ISOLATED; CULTURE IN PROGRESS FOR 5 DAYS   Report Status PENDING  Incomplete         Radiology Studies: No results found.      Scheduled Meds:  budesonide -glycopyrrolate -formoterol   2 puff Inhalation BID   dofetilide   500 mcg Oral BID   fentaNYL   1 patch Transdermal Q72H   gabapentin   300 mg Oral TID   polyethylene glycol  17 g Oral Daily   predniSONE   40 mg Oral Q breakfast   pyridOXINE   100 mg Oral QPM   Continuous Infusions:  sodium chloride  75 mL/hr at 02/19/24 0603   heparin  2,000 Units/hr (02/20/24 0901)   piperacillin -tazobactam 3.375 g (02/20/24 0859)     LOS: 17 days    Time spent: over 30 min     Meliton Monte, MD [facility]   To contact the attending provider between 7A-7P or the covering provider during after hours 7P-7A, please log into the web site [email or web address] and access using universal [place] password for that web site. If you do not have the password, please call the hospital operator.  02/20/2024, 9:43 AM

## 2024-02-21 DIAGNOSIS — R918 Other nonspecific abnormal finding of lung field: Secondary | ICD-10-CM | POA: Diagnosis not present

## 2024-02-21 LAB — CBC WITH DIFFERENTIAL/PLATELET
Abs Immature Granulocytes: 0.92 K/uL — ABNORMAL HIGH (ref 0.00–0.07)
Basophils Absolute: 0.1 K/uL (ref 0.0–0.1)
Basophils Relative: 0 %
Eosinophils Absolute: 0 K/uL (ref 0.0–0.5)
Eosinophils Relative: 0 %
HCT: 26 % — ABNORMAL LOW (ref 39.0–52.0)
Hemoglobin: 8.1 g/dL — ABNORMAL LOW (ref 13.0–17.0)
Immature Granulocytes: 2 %
Lymphocytes Relative: 2 %
Lymphs Abs: 0.8 K/uL (ref 0.7–4.0)
MCH: 25.6 pg — ABNORMAL LOW (ref 26.0–34.0)
MCHC: 31.2 g/dL (ref 30.0–36.0)
MCV: 82.3 fL (ref 80.0–100.0)
Monocytes Absolute: 1.5 K/uL — ABNORMAL HIGH (ref 0.1–1.0)
Monocytes Relative: 4 %
Neutro Abs: 36.3 K/uL — ABNORMAL HIGH (ref 1.7–7.7)
Neutrophils Relative %: 92 %
Platelets: 395 K/uL (ref 150–400)
RBC: 3.16 MIL/uL — ABNORMAL LOW (ref 4.22–5.81)
RDW: 21.6 % — ABNORMAL HIGH (ref 11.5–15.5)
WBC: 39.5 K/uL — ABNORMAL HIGH (ref 4.0–10.5)
nRBC: 0 % (ref 0.0–0.2)

## 2024-02-21 LAB — PHOSPHORUS: Phosphorus: 2.4 mg/dL — ABNORMAL LOW (ref 2.5–4.6)

## 2024-02-21 LAB — COMPREHENSIVE METABOLIC PANEL WITH GFR
ALT: 21 U/L (ref 0–44)
AST: 26 U/L (ref 15–41)
Albumin: 1.7 g/dL — ABNORMAL LOW (ref 3.5–5.0)
Alkaline Phosphatase: 110 U/L (ref 38–126)
Anion gap: 7 (ref 5–15)
BUN: 13 mg/dL (ref 8–23)
CO2: 25 mmol/L (ref 22–32)
Calcium: 9.1 mg/dL (ref 8.9–10.3)
Chloride: 103 mmol/L (ref 98–111)
Creatinine, Ser: 0.73 mg/dL (ref 0.61–1.24)
GFR, Estimated: >60 mL/min (ref >60)
Glucose, Bld: 102 mg/dL — ABNORMAL HIGH (ref 70–99)
Potassium: 4.4 mmol/L (ref 3.5–5.1)
Sodium: 135 mmol/L (ref 135–145)
Total Bilirubin: 0.4 mg/dL (ref 0.0–1.2)
Total Protein: 5.3 g/dL — ABNORMAL LOW (ref 6.5–8.1)

## 2024-02-21 LAB — ANAEROBIC CULTURE W GRAM STAIN

## 2024-02-21 LAB — MAGNESIUM: Magnesium: 1.9 mg/dL (ref 1.7–2.4)

## 2024-02-21 MED ORDER — ALBUTEROL SULFATE (2.5 MG/3ML) 0.083% IN NEBU
2.5000 mg | INHALATION_SOLUTION | RESPIRATORY_TRACT | Status: DC | PRN
  Administered 2024-02-21 – 2024-02-28 (×6): 2.5 mg via RESPIRATORY_TRACT
  Filled 2024-02-21 (×8): qty 3

## 2024-02-21 MED ORDER — METOPROLOL TARTRATE 12.5 MG HALF TABLET
12.5000 mg | ORAL_TABLET | Freq: Two times a day (BID) | ORAL | Status: DC
  Administered 2024-02-21 – 2024-02-22 (×3): 12.5 mg via ORAL
  Filled 2024-02-21 (×3): qty 1

## 2024-02-21 MED ORDER — MORPHINE (PF) INJECTION FOR INHALATION 10 MG/ML
10.0000 mg | RESPIRATORY_TRACT | Status: DC | PRN
  Administered 2024-02-22: 10 mg via RESPIRATORY_TRACT
  Filled 2024-02-21: qty 1

## 2024-02-21 NOTE — Plan of Care (Signed)
  Problem: Education: Goal: Knowledge of General Education information will improve Description: Including pain rating scale, medication(s)/side effects and non-pharmacologic comfort measures Outcome: Progressing   Problem: Health Behavior/Discharge Planning: Goal: Ability to manage health-related needs will improve Outcome: Progressing   Problem: Clinical Measurements: Goal: Ability to maintain clinical measurements within normal limits will improve Outcome: Progressing Goal: Will remain free from infection Outcome: Progressing Goal: Diagnostic test results will improve Outcome: Progressing Goal: Respiratory complications will improve Outcome: Progressing Goal: Cardiovascular complication will be avoided Outcome: Progressing   Problem: Activity: Goal: Risk for activity intolerance will decrease Outcome: Progressing   Problem: Nutrition: Goal: Adequate nutrition will be maintained Outcome: Progressing   Problem: Coping: Goal: Level of anxiety will decrease Outcome: Progressing   Problem: Elimination: Goal: Will not experience complications related to bowel motility Outcome: Progressing Goal: Will not experience complications related to urinary retention Outcome: Progressing   Problem: Pain Managment: Goal: General experience of comfort will improve and/or be controlled Outcome: Progressing   Problem: Safety: Goal: Ability to remain free from injury will improve Outcome: Progressing   Problem: Skin Integrity: Goal: Risk for impaired skin integrity will decrease Outcome: Progressing   Problem: Activity: Goal: Ability to tolerate increased activity will improve Outcome: Progressing

## 2024-02-21 NOTE — Progress Notes (Signed)
 PROGRESS NOTE    Bradley Ferguson  FMW:[id number] DOB: 02-Mar-1951 DOA: 02/03/2024 PCP: Sebastian Beverley NOVAK, MD  Chief Complaint  Patient presents with   Pneumonia    Brief Narrative:   73 y.o. male with medical history significant of patient with history of COPD, interstitial lung disease, patient has latent tuberculosis, atrial fibrillation on Tikosyn  and Xarelto , peripheral arterial disease, essential hypertension, anxiety disorder depression, history of hematuria with lung consolidation was on antibiotics by pulmonary.  Patient went to see pulmonary today due to near syncope and productive cough.  He was found to have significant dyspnea and tachycardia with heart rate in the 120s.  Patient noted to have oxygen saturation 90% on room air.  He was however noted to have hypercalcemia, leukocytosis with left shift and lactic acidosis.  He was admitted for acute hypoxemic respiratory failure in the setting of left upper lobe consolidation and underwent bronchoscopy on 8/19.  Now noted to have worsening leukocytosis and remains on IV antibiotics with BAL showing 20,000 colonies of yeast. He continues to have some worsening leukocytosis that is likely related to new cancer diagnosis with concern for NSCLC.  Started on prednisone  per pulmonology with recommendations to continue antibiotics for 3 more days.  Oncology consulted for further evaluation.   Assessment & Plan:   Principal Problem:   Mass of upper lobe of left lung Active Problems:   ANXIETY DEPRESSION   Bipolar I disorder (HCC)   Atrial fibrillation (HCC)   Peripheral arterial disease (HCC)   Chronic obstructive pulmonary disease (HCC)   Interstitial pulmonary disease (HCC)   TB lung, latent   Essential hypertension   Severe sepsis (HCC)   Hypercalcemia   Mediastinal adenopathy  Acute hypoxic respiratory failure in the setting of left upper lobe consolidation -CT scan on 8/1 showed slightly increase of the left upper lobe dense  consolidation, concerning for infection versus malignancy.  He has been having worsening shortness of breath, cough, hemoptysis on presentation.  Pulmonary consulted and following.  Legionella negative. Histoplasma antigen negative. Fungitell beta D glucan negative. Blastomyces antigen negative as well - currently on 2 L -> I increased to 5 L at bedside this morning, but not good pleth on spo2 as he was sitting up on edge of bed - wean as tolerated, will follow with RN - Given history of latent TB, he underwent AFB, now negative x 3 - Has been placed on Zosyn , continue.  Plan to continue another 5 days from 8/19.   Appreciate pulmonary follow-up, - Status post bronchoscopy 8/19 with cultures showing some yeast; appreciate pulmonary recommendations - thought to be colonizer, no antifungal needed.   -Noted to have worsening leukocytosis likely in the setting of cancer.  No fever noted.  Continue CBC monitoring. -Noted to have some mild COPD exacerbation for which prednisone  has been initiated by pulmonology on 8/22.  Stage IV Lung Cancer Surg path with non small cell carcinoma CT chest with mass like consolidation in left upper lobe, prominent mediastinal LN's.  CT abd with mediastinal LAD, increased in size.  Heterogenously enhancing nodule lateral to L psoas concerning for metastatic disease.   -given left upper lobe mass, significant hypercalcemia there is Asley Baskerville good likelihood of malignancy.  CT abdomen pelvis with contrast did show Jemal Miskell left lateral psoas nodule concerning for metastasis.  Ill defined hypoattentuating focus adjacent to falciform ligament in hepatic segment 4 (typical location for fatty infiltration, but new from 05/2023, indeterminate).   Seen by Dr. Timmy, right now  planning for hospice it seems.  Planning to send tumor for molecular analysis.     Hypercalcemia of malignancy-over 15 on presentation.  PTH appropriately low, PTH RP less than 2.   Received fluids, Lasix  resolved    Acute kidney injury-resolved, creatinine back to baseline   Pulmonary fibrosis, emphysema-contributing to his dyspnea   Persistent Leaann Nevils-fib  RVR continue dofetilide .  Transition to xarelto  from heparin  today. Will add metoprolol  with RVR today    Concern for severe sepsis sepsis physiology resolved.   Likely due to his possible upper lobe consolidation.  Continue antibiotics.  Noted to have persistent leukocytosis which has worsened since admission, monitor.   Bipolar 1 disorder-continue home regimen     DVT prophylaxis: heparin  gtt Code Status: DNR Family Communication: none Disposition:   Status is: Inpatient Remains inpatient appropriate because: need for continued inpatient care   Consultants:  Pulm oncology  Procedures:  Thoracentesis  Flexible video fiberoptic bronchoscopy with endobronchial US  and biopsies  Antimicrobials:  Anti-infectives (From admission, onward)    Start     Dose/Rate Route Frequency Ordered Stop   02/08/24 1645  piperacillin -tazobactam (ZOSYN ) IVPB 3.375 g       Note to Pharmacy: Please change dosing if needed.   3.375 g 12.5 mL/hr over 240 Minutes Intravenous Every 8 hours 02/08/24 1549     02/05/24 1800  doxycycline  (VIBRA -TABS) tablet 100 mg        100 mg Oral Every 12 hours 02/05/24 1428 02/09/24 1721   02/04/24 1700  azithromycin  (ZITHROMAX ) 500 mg in sodium chloride  0.9 % 250 mL IVPB  Status:  Discontinued        500 mg 250 mL/hr over 60 Minutes Intravenous Every 24 hours 02/03/24 1945 02/03/24 2145   02/04/24 1600  cefTRIAXone  (ROCEPHIN ) 1 g in sodium chloride  0.9 % 100 mL IVPB  Status:  Discontinued        1 g 200 mL/hr over 30 Minutes Intravenous Every 24 hours 02/03/24 1945 02/08/24 1549   02/04/24 1600  doxycycline  (VIBRAMYCIN ) 100 mg in sodium chloride  0.9 % 250 mL IVPB  Status:  Discontinued        100 mg 125 mL/hr over 120 Minutes Intravenous Every 12 hours 02/03/24 2145 02/05/24 1428   02/03/24 1645  cefTRIAXone  (ROCEPHIN ) 2  g in sodium chloride  0.9 % 100 mL IVPB  Status:  Discontinued        2 g 200 mL/hr over 30 Minutes Intravenous Once 02/03/24 1643 02/03/24 1643   02/03/24 1645  azithromycin  (ZITHROMAX ) 500 mg in sodium chloride  0.9 % 250 mL IVPB  Status:  Discontinued        500 mg 250 mL/hr over 60 Minutes Intravenous  Once 02/03/24 1643 02/03/24 1643   02/03/24 1615  cefTRIAXone  (ROCEPHIN ) 1 g in sodium chloride  0.9 % 100 mL IVPB        1 g 200 mL/hr over 30 Minutes Intravenous  Once 02/03/24 1604 02/03/24 1656   02/03/24 1615  azithromycin  (ZITHROMAX ) 500 mg in sodium chloride  0.9 % 250 mL IVPB        500 mg 250 mL/hr over 60 Minutes Intravenous  Once 02/03/24 1604 02/03/24 1801       Subjective:  C/o SOB - questions about his albuterol    Objective: Vitals:   02/21/24 0000 02/21/24 0500 02/21/24 0810 02/21/24 0811  BP: (!) 146/87 138/80    Pulse: 96 96    Resp:  20    Temp: 97.9 F (36.6 C) 97.8  F (36.6 C)    TempSrc: Oral Oral    SpO2: 95% 97% (!) 85% (!) 87%  Weight:      Height:        Intake/Output Summary (Last 24 hours) at 02/21/2024 0946 Last data filed at 02/20/2024 2200 Gross per 24 hour  Intake 120 ml  Output --  Net 120 ml   Filed Weights   02/18/24 0307 02/19/24 0451 02/20/24 0327  Weight: 73.6 kg 73.1 kg 72.7 kg    Examination:  General: No acute distress. Cardiovascular: tachy irregularly irregular Lungs: coarse breath sounds Abdomen: Soft, nontender, nondistended Neurological: Alert and oriented 3. Moves all extremities 4 with equal strength. Cranial nerves II through XII grossly intact. Extremities: No clubbing or cyanosis. No edema.   Data Reviewed: I have personally reviewed following labs and imaging studies  CBC: Recent Labs  Lab 02/17/24 0626 02/18/24 0311 02/19/24 0246 02/20/24 0246 02/21/24 0300  WBC 33.3* 45.1* 42.8* 38.2* 39.5*  NEUTROABS  --   --  37.9*  --  36.3*  HGB 8.6* 8.6* 9.3* 8.4* 8.1*  HCT 27.2* 27.7* 29.3* 26.2* 26.0*  MCV  81.2 82.0 80.7 80.6 82.3  PLT 368 422* 440* 434* 395    Basic Metabolic Panel: Recent Labs  Lab 02/17/24 0626 02/18/24 0311 02/19/24 0246 02/20/24 0246 02/21/24 0300  NA 137 135 137 134* 135  K 4.9 4.5 4.2 4.1 4.4  CL 105 104 105 104 103  CO2 20* 19* 21* 23 25  GLUCOSE 129* 119* 105* 138* 102*  BUN 12 15 11 13 13   CREATININE 0.91 0.84 0.79 0.82 0.73  CALCIUM  9.2 9.1 9.0 9.0 9.1  MG 2.2 1.8 1.9 1.9 1.9  PHOS  --   --   --   --  2.4*    GFR: Estimated Creatinine Clearance: 85.8 mL/min (by C-G formula based on SCr of 0.73 mg/dL).  Liver Function Tests: Recent Labs  Lab 02/15/24 0421 02/16/24 0349 02/21/24 0300  AST 29 30 26   ALT 22 20 21   ALKPHOS 108 115 110  BILITOT 0.3 <0.2 0.4  PROT 5.3* 4.9* 5.3*  ALBUMIN  1.7* 1.5* 1.7*    CBG: No results for input(s): GLUCAP in the last 168 hours.   Recent Results (from the past 240 hours)  Culture, BAL-quantitative w Gram Stain     Status: Abnormal   Collection Time: 02/16/24  3:46 PM   Specimen: Bronchial Alveolar Lavage; Respiratory  Result Value Ref Range Status   Specimen Description BRONCHIAL ALVEOLAR LAVAGE  Final   Special Requests LUL  Final   Gram Stain   Final    FEW WBC PRESENT, PREDOMINANTLY PMN NO ORGANISMS SEEN Performed at [facility], [address], [place], [place] [address]    Culture 20,000 COLONIES/mL CANDIDA ALBICANS (Donaldo Teegarden)  Final   Report Status 02/19/2024 FINAL  Final  Acid Fast Smear (AFB)     Status: None   Collection Time: 02/16/24  3:46 PM   Specimen: Bronchial Alveolar Lavage; Respiratory  Result Value Ref Range Status   AFB Specimen Processing Concentration  Final   Acid Fast Smear Negative  Final    Comment: (NOTE) Performed At: [facility] [address] [place], [place] [id number] Jennette Shorter MD Ey:[id number]    Source (AFB) BRONCHIAL ALVEOLAR LAVAGE  Final    Comment: LUL Performed at [facility], [address] [place], [place] [address]    Anaerobic culture w Gram Stain     Status: None (  Preliminary result)   Collection Time: 02/16/24  3:46 PM   Specimen: Bronchoalveolar Lavage  Result Value Ref Range Status   Specimen Description BRONCHIAL ALVEOLAR LAVAGE  Final   Special Requests LUL  Final   Gram Stain   Final    FEW WBC PRESENT, PREDOMINANTLY PMN NO ORGANISMS SEEN Performed at [facility], [address] [place], [place] [address]    Culture   Final    NO ANAEROBES ISOLATED; CULTURE IN PROGRESS FOR 5 DAYS   Report Status PENDING  Incomplete         Radiology Studies: No results found.      Scheduled Meds:  budesonide -glycopyrrolate -formoterol   2 puff Inhalation BID   dofetilide   500 mcg Oral BID   fentaNYL   1 patch Transdermal Q72H   gabapentin   300 mg Oral TID   metoprolol  tartrate  12.5 mg Oral BID   polyethylene glycol  17 g Oral Daily   predniSONE   40 mg Oral Q breakfast   pyridOXINE   100 mg Oral QPM   rivaroxaban   20 mg Oral Q supper   Continuous Infusions:  piperacillin -tazobactam 3.375 g (02/21/24 0432)     LOS: 18 days    Time spent: over 30 min     Meliton Monte, MD [facility]   To contact the attending provider between 7A-7P or the covering provider during after hours 7P-7A, please log into the web site [email or web address] and access using universal [place] password for that web site. If you do not have the password, please call the hospital operator.  02/21/2024, 9:46 AM

## 2024-02-22 ENCOUNTER — Inpatient Hospital Stay (HOSPITAL_COMMUNITY)

## 2024-02-22 DIAGNOSIS — R008 Other abnormalities of heart beat: Secondary | ICD-10-CM

## 2024-02-22 DIAGNOSIS — R918 Other nonspecific abnormal finding of lung field: Secondary | ICD-10-CM | POA: Diagnosis not present

## 2024-02-22 LAB — ECHOCARDIOGRAM COMPLETE
Area-P 1/2: 2.91 cm2
Height: 72.992 in
MV VTI: 1.41 cm2
Weight: 2567.92 [oz_av]

## 2024-02-22 LAB — COMPREHENSIVE METABOLIC PANEL WITH GFR
ALT: 24 U/L (ref 0–44)
AST: 29 U/L (ref 15–41)
Albumin: 2 g/dL — ABNORMAL LOW (ref 3.5–5.0)
Alkaline Phosphatase: 114 U/L (ref 38–126)
Anion gap: 11 (ref 5–15)
BUN: 17 mg/dL (ref 8–23)
CO2: 24 mmol/L (ref 22–32)
Calcium: 9.2 mg/dL (ref 8.9–10.3)
Chloride: 102 mmol/L (ref 98–111)
Creatinine, Ser: 0.82 mg/dL (ref 0.61–1.24)
GFR, Estimated: >60 mL/min (ref >60)
Glucose, Bld: 110 mg/dL — ABNORMAL HIGH (ref 70–99)
Potassium: 3.9 mmol/L (ref 3.5–5.1)
Sodium: 137 mmol/L (ref 135–145)
Total Bilirubin: 0.5 mg/dL (ref 0.0–1.2)
Total Protein: 6.1 g/dL — ABNORMAL LOW (ref 6.5–8.1)

## 2024-02-22 LAB — CBC WITH DIFFERENTIAL/PLATELET
Abs Granulocyte: 42.9 K/uL — ABNORMAL HIGH (ref 1.5–6.5)
Abs Immature Granulocytes: 1.61 K/uL — ABNORMAL HIGH (ref 0.00–0.07)
Basophils Absolute: 0.1 K/uL (ref 0.0–0.1)
Basophils Relative: 0 %
Eosinophils Absolute: 0 K/uL (ref 0.0–0.5)
Eosinophils Relative: 0 %
HCT: 28.8 % — ABNORMAL LOW (ref 39.0–52.0)
Hemoglobin: 8.9 g/dL — ABNORMAL LOW (ref 13.0–17.0)
Immature Granulocytes: 3 %
Lymphocytes Relative: 2 %
Lymphs Abs: 0.8 K/uL (ref 0.7–4.0)
MCH: 25.4 pg — ABNORMAL LOW (ref 26.0–34.0)
MCHC: 30.9 g/dL (ref 30.0–36.0)
MCV: 82.1 fL (ref 80.0–100.0)
Monocytes Absolute: 1.6 K/uL — ABNORMAL HIGH (ref 0.1–1.0)
Monocytes Relative: 3 %
Neutro Abs: 42.9 K/uL — ABNORMAL HIGH (ref 1.7–7.7)
Neutrophils Relative %: 92 %
Platelets: 475 K/uL — ABNORMAL HIGH (ref 150–400)
RBC: 3.51 MIL/uL — ABNORMAL LOW (ref 4.22–5.81)
RDW: 21.7 % — ABNORMAL HIGH (ref 11.5–15.5)
WBC: 47.1 K/uL — ABNORMAL HIGH (ref 4.0–10.5)
nRBC: 0 % (ref 0.0–0.2)

## 2024-02-22 LAB — TECHNOLOGIST SMEAR REVIEW: Plt Morphology: NORMAL

## 2024-02-22 LAB — BRAIN NATRIURETIC PEPTIDE: B Natriuretic Peptide: 691.7 pg/mL — ABNORMAL HIGH (ref 0.0–100.0)

## 2024-02-22 LAB — CYTOLOGY - NON PAP

## 2024-02-22 LAB — PHOSPHORUS: Phosphorus: 1.9 mg/dL — ABNORMAL LOW (ref 2.5–4.6)

## 2024-02-22 LAB — MAGNESIUM: Magnesium: 1.8 mg/dL (ref 1.7–2.4)

## 2024-02-22 MED ORDER — POTASSIUM CHLORIDE CRYS ER 20 MEQ PO TBCR
40.0000 meq | EXTENDED_RELEASE_TABLET | Freq: Once | ORAL | Status: AC
  Administered 2024-02-22: 40 meq via ORAL
  Filled 2024-02-22: qty 2

## 2024-02-22 MED ORDER — MAGNESIUM SULFATE IN D5W 1-5 GM/100ML-% IV SOLN
1.0000 g | Freq: Once | INTRAVENOUS | Status: AC
  Administered 2024-02-22: 1 g via INTRAVENOUS
  Filled 2024-02-22: qty 100

## 2024-02-22 MED ORDER — METOPROLOL TARTRATE 12.5 MG HALF TABLET
12.5000 mg | ORAL_TABLET | Freq: Once | ORAL | Status: AC
  Administered 2024-02-22: 12.5 mg via ORAL
  Filled 2024-02-22: qty 1

## 2024-02-22 MED ORDER — K PHOS MONO-SOD PHOS DI & MONO 155-852-130 MG PO TABS
500.0000 mg | ORAL_TABLET | Freq: Four times a day (QID) | ORAL | Status: AC
  Administered 2024-02-22 (×4): 500 mg via ORAL
  Filled 2024-02-22 (×5): qty 2

## 2024-02-22 MED ORDER — METOPROLOL TARTRATE 5 MG/5ML IV SOLN
2.5000 mg | INTRAVENOUS | Status: DC | PRN
  Administered 2024-02-28 (×2): 2.5 mg via INTRAVENOUS
  Filled 2024-02-22 (×3): qty 5

## 2024-02-22 MED ORDER — METOPROLOL TARTRATE 25 MG PO TABS
25.0000 mg | ORAL_TABLET | Freq: Two times a day (BID) | ORAL | Status: DC
  Administered 2024-02-22: 25 mg via ORAL
  Filled 2024-02-22: qty 1

## 2024-02-22 MED ORDER — FUROSEMIDE 10 MG/ML IJ SOLN
40.0000 mg | Freq: Once | INTRAMUSCULAR | Status: AC
  Administered 2024-02-22: 40 mg via INTRAVENOUS
  Filled 2024-02-22: qty 4

## 2024-02-22 NOTE — Progress Notes (Addendum)
 PROGRESS NOTE    Bradley Ferguson  FMW:[id number] DOB: 28-Aug-1950 DOA: 02/03/2024 PCP: Sebastian Beverley NOVAK, MD  Chief Complaint  Patient presents with   Pneumonia    Brief Narrative:   73 y.o. male with medical history significant of patient with history of COPD, interstitial lung disease, patient has latent tuberculosis, atrial fibrillation on Tikosyn  and Xarelto , peripheral arterial disease, essential hypertension, anxiety disorder depression, history of hematuria with lung consolidation was on antibiotics by pulmonary.  Patient went to see pulmonary today due to near syncope and productive cough.  He was found to have significant dyspnea and tachycardia with heart rate in the 120s.  Patient noted to have oxygen saturation 90% on room air.  He was however noted to have hypercalcemia, leukocytosis with left shift and lactic acidosis.  He was admitted for acute hypoxemic respiratory failure in the setting of left upper lobe consolidation and underwent bronchoscopy on 8/19.  Now noted to have worsening leukocytosis and remains on IV antibiotics with BAL showing 20,000 colonies of yeast. He continues to have some worsening leukocytosis that is likely related to new cancer diagnosis with concern for NSCLC.  Started on prednisone  per pulmonology with recommendations to continue antibiotics for 3 more days.  Oncology consulted for further evaluation.   Assessment & Plan:   Principal Problem:   Mass of upper lobe of left lung Active Problems:   ANXIETY DEPRESSION   Bipolar I disorder (HCC)   Atrial fibrillation (HCC)   Peripheral arterial disease (HCC)   Chronic obstructive pulmonary disease (HCC)   Interstitial pulmonary disease (HCC)   TB lung, latent   Essential hypertension   Severe sepsis (HCC)   Hypercalcemia   Mediastinal adenopathy  Acute hypoxic respiratory failure in the setting of left upper lobe consolidation -CT scan on 8/1 showed slightly increase of the left upper lobe dense  consolidation, concerning for infection versus malignancy.  He has been having worsening shortness of breath, cough, hemoptysis on presentation.  Pulmonary consulted and following.  Legionella negative. Histoplasma antigen negative. Fungitell beta D glucan negative. Blastomyces antigen negative as well - currently on 2 L -> I increased to 5 L at bedside this morning, but not good pleth on spo2 as he was sitting up on edge of bed - wean as tolerated, will follow with RN - Given history of latent TB, he underwent AFB, now negative x 3 - Has been placed on Zosyn , continue.  Plan to continue another 5 days from 8/19 per pulm.   Appreciate pulmonary follow-up, - Status post bronchoscopy 8/19 with cultures showing some yeast; appreciate pulmonary recommendations - thought to be colonizer, no antifungal needed.   -Noted to have worsening leukocytosis likely in the setting of cancer.  No fever noted.  Continue CBC monitoring. -steroids, breztria, albuterol  prn in setting of COPD exacerbation. -sob worsening today, increase WOB - follow repeat CXR, BNP, echo  Stage IV Lung Cancer Surg path with non small cell carcinoma CT chest with mass like consolidation in left upper lobe, prominent mediastinal LN's.  CT abd with mediastinal LAD, increased in size.  Heterogenously enhancing nodule lateral to L psoas concerning for metastatic disease.   -given left upper lobe mass, significant hypercalcemia there is Bradley Ferguson good likelihood of malignancy.  CT abdomen pelvis with contrast did show Bradley Ferguson left lateral psoas nodule concerning for metastasis.  Ill defined hypoattentuating focus adjacent to falciform ligament in hepatic segment 4 (typical location for fatty infiltration, but new from 05/2023, indeterminate).   Seen  by Dr. Timmy, right now planning for hospice it seems.  Planning to send tumor for molecular analysis.     Hypercalcemia of malignancy-over 15 on presentation.  PTH appropriately low, PTH RP less than 2.    Received fluids, Lasix  resolved   Acute kidney injury-resolved, creatinine back to baseline   Pulmonary fibrosis, emphysema-contributing to his dyspnea   Persistent Bradley Ferguson-fib  RVR continue dofetilide .  Transition to xarelto  from heparin  today. Will titrate metoprolol  with continued RVR today    Concern for severe sepsis sepsis physiology resolved.   Likely due to his possible upper lobe consolidation.  Noted to have persistent leukocytosis which has worsened since admission, monitor.  Currently afebrile.  Remains on abx.    Bipolar 1 disorder-continue home regimen     DVT prophylaxis: xarelto  Code Status: DNR Family Communication: none Disposition:   Status is: Inpatient Remains inpatient appropriate because: need for continued inpatient care   Consultants:  Pulm oncology  Procedures:  Thoracentesis  Flexible video fiberoptic bronchoscopy with endobronchial US  and biopsies  Antimicrobials:  Anti-infectives (From admission, onward)    Start     Dose/Rate Route Frequency Ordered Stop   02/08/24 1645  piperacillin -tazobactam (ZOSYN ) IVPB 3.375 g       Note to Pharmacy: Please change dosing if needed.   3.375 g 12.5 mL/hr over 240 Minutes Intravenous Every 8 hours 02/08/24 1549     02/05/24 1800  doxycycline  (VIBRA -TABS) tablet 100 mg        100 mg Oral Every 12 hours 02/05/24 1428 02/09/24 1721   02/04/24 1700  azithromycin  (ZITHROMAX ) 500 mg in sodium chloride  0.9 % 250 mL IVPB  Status:  Discontinued        500 mg 250 mL/hr over 60 Minutes Intravenous Every 24 hours 02/03/24 1945 02/03/24 2145   02/04/24 1600  cefTRIAXone  (ROCEPHIN ) 1 g in sodium chloride  0.9 % 100 mL IVPB  Status:  Discontinued        1 g 200 mL/hr over 30 Minutes Intravenous Every 24 hours 02/03/24 1945 02/08/24 1549   02/04/24 1600  doxycycline  (VIBRAMYCIN ) 100 mg in sodium chloride  0.9 % 250 mL IVPB  Status:  Discontinued        100 mg 125 mL/hr over 120 Minutes Intravenous Every 12 hours  02/03/24 2145 02/05/24 1428   02/03/24 1645  cefTRIAXone  (ROCEPHIN ) 2 g in sodium chloride  0.9 % 100 mL IVPB  Status:  Discontinued        2 g 200 mL/hr over 30 Minutes Intravenous Once 02/03/24 1643 02/03/24 1643   02/03/24 1645  azithromycin  (ZITHROMAX ) 500 mg in sodium chloride  0.9 % 250 mL IVPB  Status:  Discontinued        500 mg 250 mL/hr over 60 Minutes Intravenous  Once 02/03/24 1643 02/03/24 1643   02/03/24 1615  cefTRIAXone  (ROCEPHIN ) 1 g in sodium chloride  0.9 % 100 mL IVPB        1 g 200 mL/hr over 30 Minutes Intravenous  Once 02/03/24 1604 02/03/24 1656   02/03/24 1615  azithromycin  (ZITHROMAX ) 500 mg in sodium chloride  0.9 % 250 mL IVPB        500 mg 250 mL/hr over 60 Minutes Intravenous  Once 02/03/24 1604 02/03/24 1801       Subjective:  C/o SOB - questions about his albuterol    Objective: Vitals:   02/21/24 2335 02/22/24 0445 02/22/24 0815 02/22/24 0855  BP:      Pulse:   (!) 107   Resp:  19   Temp:    98.4 F (36.9 C)  TempSrc: Oral Oral    SpO2:   (!) 85%   Weight:  72.8 kg    Height:        Intake/Output Summary (Last 24 hours) at 02/22/2024 0919 Last data filed at 02/22/2024 9360 Gross per 24 hour  Intake 610 ml  Output 825 ml  Net -215 ml   Filed Weights   02/19/24 0451 02/20/24 0327 02/22/24 0445  Weight: 73.1 kg 72.7 kg 72.8 kg    Examination:  General: No acute distress. Cardiovascular: tachy, irregularly irregular Lungs: coarse breath sounds, increased WOB, tachypneic Abdomen: Soft, nontender, nondistended  Neurological: Alert and oriented 3. Moves all extremities 4 with equal strength. Cranial nerves II through XII grossly intact. Extremities: No clubbing or cyanosis. No edema.   Data Reviewed: I have personally reviewed following labs and imaging studies  CBC: Recent Labs  Lab 02/18/24 0311 02/19/24 0246 02/20/24 0246 02/21/24 0300 02/22/24 0226  WBC 45.1* 42.8* 38.2* 39.5* 47.1*  NEUTROABS  --  37.9*  --  36.3* 42.9*   HGB 8.6* 9.3* 8.4* 8.1* 8.9*  HCT 27.7* 29.3* 26.2* 26.0* 28.8*  MCV 82.0 80.7 80.6 82.3 82.1  PLT 422* 440* 434* 395 475*    Basic Metabolic Panel: Recent Labs  Lab 02/18/24 0311 02/19/24 0246 02/20/24 0246 02/21/24 0300 02/22/24 0226  NA 135 137 134* 135 137  K 4.5 4.2 4.1 4.4 3.9  CL 104 105 104 103 102  CO2 19* 21* 23 25 24   GLUCOSE 119* 105* 138* 102* 110*  BUN 15 11 13 13 17   CREATININE 0.84 0.79 0.82 0.73 0.82  CALCIUM  9.1 9.0 9.0 9.1 9.2  MG 1.8 1.9 1.9 1.9 1.8  PHOS  --   --   --  2.4* 1.9*    GFR: Estimated Creatinine Clearance: 83.8 mL/min (by C-G formula based on SCr of 0.82 mg/dL).  Liver Function Tests: Recent Labs  Lab 02/16/24 0349 02/21/24 0300 02/22/24 0226  AST 30 26 29   ALT 20 21 24   ALKPHOS 115 110 114  BILITOT <0.2 0.4 0.5  PROT 4.9* 5.3* 6.1*  ALBUMIN  1.5* 1.7* 2.0*    CBG: No results for input(s): GLUCAP in the last 168 hours.   Recent Results (from the past 240 hours)  Culture, BAL-quantitative w Gram Stain     Status: Abnormal   Collection Time: 02/16/24  3:46 PM   Specimen: Bronchial Alveolar Lavage; Respiratory  Result Value Ref Range Status   Specimen Description BRONCHIAL ALVEOLAR LAVAGE  Final   Special Requests LUL  Final   Gram Stain   Final    FEW WBC PRESENT, PREDOMINANTLY PMN NO ORGANISMS SEEN Performed at [facility], [address], [place], [place] [address]    Culture 20,000 COLONIES/mL CANDIDA ALBICANS (Dagon Budai)  Final   Report Status 02/19/2024 FINAL  Final  Acid Fast Smear (AFB)     Status: None   Collection Time: 02/16/24  3:46 PM   Specimen: Bronchial Alveolar Lavage; Respiratory  Result Value Ref Range Status   AFB Specimen Processing Concentration  Final   Acid Fast Smear Negative  Final    Comment: (NOTE) Performed At: [facility] [address] [place], [place] [id number] Jennette Shorter MD Ey:[id number]    Source (AFB) BRONCHIAL ALVEOLAR LAVAGE  Final    Comment: LUL Performed at  [facility], [address], [place], [place] [address]   Anaerobic culture w Gram  Stain     Status: None   Collection Time: 02/16/24  3:46 PM   Specimen: Bronchoalveolar Lavage  Result Value Ref Range Status   Specimen Description BRONCHIAL ALVEOLAR LAVAGE  Final   Special Requests LUL  Final   Gram Stain   Final    FEW WBC PRESENT, PREDOMINANTLY PMN NO ORGANISMS SEEN    Culture   Final    NO ANAEROBES ISOLATED Performed at [facility], [address], [place], [place] [address]    Report Status 02/21/2024 FINAL  Final         Radiology Studies: No results found.      Scheduled Meds:  budesonide -glycopyrrolate -formoterol   2 puff Inhalation BID   dofetilide   500 mcg Oral BID   fentaNYL   1 patch Transdermal Q72H   gabapentin   300 mg Oral TID   metoprolol  tartrate  12.5 mg Oral Once   metoprolol  tartrate  25 mg Oral BID   polyethylene glycol  17 g Oral Daily   predniSONE   40 mg Oral Q breakfast   pyridOXINE   100 mg Oral QPM   rivaroxaban   20 mg Oral Q supper   Continuous Infusions:  piperacillin -tazobactam 3.375 g (02/22/24 0907)     LOS: 19 days    Time spent: over 30 min     Meliton Monte, MD [facility]   To contact the attending provider between 7A-7P or the covering provider during after hours 7P-7A, please log into the web site [email or web address] and access using universal [place] password for that web site. If you do not have the password, please call the hospital operator.  02/22/2024, 9:19 AM

## 2024-02-22 NOTE — Progress Notes (Signed)
 Bradley Ferguson   DOB:1950/09/05   FM#:[id number]      ASSESSMENT & PLAN:  Bradley Ferguson 73 y.o. male who was presented on 02/16/2024 for surgery due to lung mass. Patient diagnosed with Lung cancer. Therefore oncology evaluation has been requested. Medical Oncology/Dr. Timmy following.  Non-Small Cell Lung Cancer (NSCLC) -- CT imaging done 8/1 showed mass left upper lobe -- Status post thoracentesis and bronch done 8/19.  Path confirms non-small cell carcinoma. -- Pending molecular analysis of tumor.  -- Patient with poor performance status.   -- Recommend focus on quality of life at this point.   -- Pending discharge to SNF.  -- Medical Oncology/Dr. Timmy following closely.   Hypoxic respiratory failure COPD Emphysema Shortness of breath -- On O2 via [place] -- On antibiotics and respiratory treatments -- Continue supportive care    Anemia -- Hemoglobin 8.9 today -- No transfusional intervention required at this time -- Monitor CBC with differential   Thrombocytosis -- Likely reactive -- Platelets 475K today -- No intervention required at this time -- Monitor CBC with differential   Leukocytosis -- Elevated WBC, worsening, 47.1 today -- Afebrile, monitor fever curve -- Antibiotics as ordered -- Monitor CBC with differential   Afib -- On Xarelto   -- Status post IV heparin   -- Monitor for bleeding      Code Status DNR-Limited  Subjective:  Patient seen resting comfortably in bed. He is ill-appearing and pale.  Reports ongoing shortness of breath although does not appear short of breath when talking.  No acute complaints offered.   Objective:   Intake/Output Summary (Last 24 hours) at 02/22/2024 1328 Last data filed at 02/22/2024 9360 Gross per 24 hour  Intake 370 ml  Output 825 ml  Net -455 ml     PHYSICAL EXAMINATION: ECOG PERFORMANCE STATUS: 3 - Symptomatic, >50% confined to bed  Vitals:   02/22/24 0815 02/22/24 0855  BP:    Pulse: (!) 107   Resp:  19   Temp:  98.4 F (36.9 C)  SpO2: (!) 85%    Filed Weights   02/19/24 0451 02/20/24 0327 02/22/24 0445  Weight: 161 lb 2.5 oz (73.1 kg) 160 lb 4.4 oz (72.7 kg) 160 lb 7.9 oz (72.8 kg)    GENERAL: alert, no distress and comfortable SKIN: +pale skin color, texture, turgor are normal, no rashes or significant lesions EYES: normal, conjunctiva are pink and non-injected, sclera clear OROPHARYNX: no exudate, no erythema and lips, buccal mucosa, and tongue normal  NECK: supple, thyroid  normal size, non-tender, without nodularity LYMPH: no palpable lymphadenopathy in the cervical, axillary or inguinal LUNGS: +coarse breath sounds HEART: regular rate & rhythm and no murmurs and no lower extremity edema ABDOMEN: abdomen soft, non-tender and normal bowel sounds MUSCULOSKELETAL: no cyanosis of digits and no clubbing  PSYCH: alert & oriented x 3 with fluent speech NEURO: no focal motor/sensory deficits   All questions were answered. The patient knows to call the clinic with any problems, questions or concerns.   The total time spent in the appointment was 40 minutes encounter with patient including review of chart and various tests results, discussions about plan of care and coordination of care plan  Olam JINNY Brunner, NP 02/22/2024 1:28 PM    Labs Reviewed:  Lab Results  Component Value Date   WBC 47.1 (H) 02/22/2024   HGB 8.9 (L) 02/22/2024   HCT 28.8 (L) 02/22/2024   MCV 82.1 02/22/2024   PLT 475 (H) 02/22/2024   Recent  Labs    02/16/24 0349 02/17/24 0626 02/20/24 0246 02/21/24 0300 02/22/24 0226  NA 136   < > 134* 135 137  K 3.7   < > 4.1 4.4 3.9  CL 108   < > 104 103 102  CO2 20*   < > 23 25 24   GLUCOSE 120*   < > 138* 102* 110*  BUN 7*   < > 13 13 17   CREATININE 1.01   < > 0.82 0.73 0.82  CALCIUM  8.8*   < > 9.0 9.1 9.2  GFRNONAA >60   < > >60 >60 >60  PROT 4.9*  --   --  5.3* 6.1*  ALBUMIN  1.5*  --   --  1.7* 2.0*  AST 30  --   --  26 29  ALT 20  --   --  21 24   ALKPHOS 115  --   --  110 114  BILITOT <0.2  --   --  0.4 0.5   < > = values in this interval not displayed.    Studies Reviewed:  DG Chest Port 1 View Result Date: 02/16/2024 CLINICAL DATA:  Status post bronchoscopy with biopsy. EXAM: PORTABLE CHEST 1 VIEW COMPARISON:  February 15, 2024. FINDINGS: Stable cardiomediastinal silhouette. Stable left lung opacity is noted concerning for pneumonia. No pneumothorax is noted. Stable left pleural effusion with associated atelectasis or infiltrate. Minimal right basilar subsegmental atelectasis is noted. Bony thorax is unremarkable. IMPRESSION: Stable left lung opacity is noted concerning for pneumonia. Stable left pleural effusion with associated atelectasis or infiltrate. No pneumothorax is noted. Electronically Signed   By: Lynwood Landy Raddle M.D.   On: 02/16/2024 16:39   DG C-ARM BRONCHOSCOPY Result Date: 02/16/2024 C-ARM BRONCHOSCOPY: Fluoroscopy was utilized by the requesting physician.  No radiographic interpretation.   DG Chest Port 1 View Result Date: 02/15/2024 CLINICAL DATA:  Status post bronchoscopy with biopsy. EXAM: PORTABLE CHEST 1 VIEW COMPARISON:  None Available. FINDINGS: No pneumothorax following bronchoscopy. The previously demonstrated cavitary consolidation in the left upper lobe has not changed significantly, partially obscured by interval increased density more medially. Small to moderate-sized left pleural effusion with a mild increase in size. Mild increase in left basilar atelectasis. Mildly increased prominence of the interstitial markings with Kerley lines. Mildly increased pulmonary vasculature on the right. No right pleural fluid seen. Borderline enlarged cardiac silhouette. Aortic arch calcifications. Unremarkable bones. IMPRESSION: 1. No pneumothorax. 2. No significant change in the previously demonstrated cavitary consolidation in the left upper lobe. 3. Mild increase in size of a small to moderate-sized left pleural effusion. 4.  Mildly increased left basilar atelectasis. 5. Mild changes of acute CHF. 6. Borderline cardiomegaly. Electronically Signed   By: Elspeth Bathe M.D.   On: 02/15/2024 11:15   DG CHEST PORT 1 VIEW Result Date: 02/10/2024 CLINICAL DATA:  Post thoracentesis EXAM: PORTABLE CHEST 1 VIEW COMPARISON:  Chest x-ray 02/03/2024. FINDINGS: Small left pleural effusion appears unchanged. There is no evidence for pneumothorax. Airspace consolidation with central cavitation in the left upper lobe appears unchanged. Additional scattered interstitial opacities throughout both lungs appear unchanged. Cardiomediastinal silhouette is within normal limits. The osseous structures are stable. IMPRESSION: 1. No evidence for pneumothorax. 2. Stable small left pleural effusion. 3. Stable airspace consolidation with central cavitation in the left upper lobe. Electronically Signed   By: Greig Pique M.D.   On: 02/10/2024 15:51   CT ABDOMEN PELVIS W CONTRAST Result Date: 02/09/2024 CLINICAL DATA:  Persistent left upper lobe  consolidative mass and left lower lobe nodule, suspicious for malignancy. Staging. * Tracking Code: BO * EXAM: CT ABDOMEN AND PELVIS WITH CONTRAST TECHNIQUE: Multidetector CT imaging of the abdomen and pelvis was performed using the standard protocol following bolus administration of intravenous contrast. RADIATION DOSE REDUCTION: This exam was performed according to the departmental dose-optimization program which includes automated exposure control, adjustment of the mA and/or kV according to patient size and/or use of iterative reconstruction technique. CONTRAST:  75mL OMNIPAQUE  IOHEXOL  350 MG/ML SOLN COMPARISON:  CTA abdomen and pelvis dated 05/29/2023, CT chest dated 01/29/2024 FINDINGS: Lower chest: Partially imaged posterior left upper lobe mass-like consolidation extending to the hilum and encasing the left upper lobe airways. Interval increased left lower lobe volume loss with suggestion of heterogeneous  consolidation. Partially imaged fibrotic changes of the right lung. Increased left pleural effusion, which appears at least partially loculated. Partially imaged heterogeneously enhancing, nodular pleural thickening, for example 5.5 x 1.7 cm at the left lung base (3:28). Partially imaged multichamber cardiomegaly. Partially imaged mediastinal lymphadenopathy, which appears slightly increased in size, for example 12 mm (3: 1), previously 9 mm. Coronary artery calcifications. Hepatobiliary: Ill-defined hypoattenuating focus adjacent to the falciform ligament in segment 4 measuring 2.0 x 1.4 cm (3:45) is in a location typical for focal fatty infiltration, however is new from 05/29/2023. Unchanged subcentimeter segment 2 hypodensities, too small to characterize, but likely cysts. No intra or extrahepatic biliary ductal dilation. Normal gallbladder. Pancreas: No focal lesions or main ductal dilation. Spleen: Normal in size without focal abnormality. Adrenals/Urinary Tract: No adrenal nodules. No suspicious renal mass or hydronephrosis. 2 mm hyperattenuating focus within the proximal left ureter (3:57) without obstruction. No focal bladder wall thickening. Stomach/Bowel: Normal appearance of the stomach. No evidence of bowel wall thickening, distention, or inflammatory changes. Colonic diverticulosis without acute diverticulitis. Normal appendix. Vascular/Lymphatic: Aortic atherosclerosis. Infrarenal abdominal aortic aneurysm measures 3.2 x 3.1 cm, unchanged. Similar occlusion of the proximal left femoral artery. Prominent left retrocrural lymph node measures 7 mm (3:34). Reproductive: Enlargement of the prostate with median lobe hypertrophy. Other: No free fluid, fluid collection, or free air. New, heterogeneously enhancing nodule lateral to the left psoas muscle measures 1.3 x 1.2 cm (3:63). Musculoskeletal: No acute or abnormal lytic or blastic osseous lesions. IMPRESSION: 1. Partially imaged posterior left upper lobe  mass-like consolidation extending to the hilum and encasing the left upper lobe airways, highly suspicious for malignancy. Interval increased left lower lobe volume loss with suggestion of heterogeneous consolidation, which may reflect atelectasis or postobstructive pneumonia. 2. Increased left pleural effusion, which appears at least partially loculated. Partially imaged heterogeneously enhancing, nodular pleural thickening, suspicious for pleural metastases. 3. Partially imaged mediastinal lymphadenopathy, which appears slightly increased in size, suspicious for metastasis. 4. New, heterogeneously enhancing nodule lateral to the left psoas muscle measures 1.3 x 1.2 cm, suspicious for metastatic disease. 5. Ill-defined hypoattenuating focus adjacent to the falciform ligament in hepatic segment 4 is in a location typical for focal fatty infiltration, however is new from 05/29/2023, indeterminate. 6. A 2 mm hyperattenuating focus within the proximal left ureter without obstruction, which may represent a nonobstructing calculus. 7. Unchanged infrarenal abdominal aortic aneurysm measures 3.2 cm. 8. Aortic Atherosclerosis (ICD10-I70.0). Coronary artery calcifications. Assessment for potential risk factor modification, dietary therapy or pharmacologic therapy may be warranted, if clinically indicated. Electronically Signed   By: Limin  Xu M.D.   On: 02/09/2024 08:55   DG Chest 2 View Result Date: 02/03/2024 CLINICAL DATA:  Shortness of breath.  Tachycardia. EXAM: CHEST - 2 VIEW COMPARISON:  12/29/2023 and CT chest 01/29/2024. FINDINGS: The trachea is midline. Heart size stable. Cavitary masslike consolidation in the left upper lobe has progressed from 12/29/2023. Associated pleural thickening and pleural fluid in the lateral and lower left hemithorax. Mild coarsening of the peripheral pulmonary markings. IMPRESSION: 1. Worsening cavitary masslike consolidation in the left upper lobe, worrisome for primary bronchogenic  carcinoma. Pneumonia is not excluded. 2. Partially loculated small left pleural effusion and/or thickening. 3. Interstitial lung disease, better seen on 01/29/2024. Electronically Signed   By: Newell Eke M.D.   On: 02/03/2024 16:32   CT CHEST WO CONTRAST Result Date: 02/03/2024 CLINICAL DATA:  Pneumonia, complications suspected, x-ray done. * Tracking Code: BO * EXAM: CT CHEST WITHOUT CONTRAST TECHNIQUE: Multidetector CT imaging of the chest was performed following the standard protocol without IV contrast. RADIATION DOSE REDUCTION: This exam was performed according to the departmental dose-optimization program which includes automated exposure control, adjustment of the mA and/or kV according to patient size and/or use of iterative reconstruction technique. COMPARISON:  CT December 29, 2023 and June 04, 2021 FINDINGS: Cardiovascular: Aortic atherosclerosis. Three-vessel coronary artery calcifications/stents. Calcifications of the aortic valve and annulus. Calcifications of the mitral annulus. Normal size heart. No significant pericardial effusion/thickening. Mediastinum/Nodes: No suspicious thyroid  nodule. Prominent mediastinal lymph nodes are similar prior for instance an AP window lymph node measuring 9 mm in short axis on image 56/301 is unchanged. The esophagus is grossly unremarkable. Lungs/Pleura: Persistent dense consolidation with air bronchograms in the left upper lobe measuring 7.5 x 5.6 cm on image 46/302 previously 7.0 x 5.6 cm. Increased size of the adjacent pleural effusion/pleural thickening. Additional consolidative nodularity in the left lower lobe has increased from prior for instance measuring 17 x 10 mm on image 73/302 previously 14 x 7 mm. Similar findings of basilar predominant interstitial lung disease. Upper Abdomen: No acute abnormality. Musculoskeletal: No aggressive lytic or blastic lesion of bone. Multilevel degenerative changes spine. Diffuse demineralization of bone. IMPRESSION:  1. Slight increase in size of the dense mass like consolidation with air bronchograms in the left upper lobe with increased adjacent pleural effusion/pleural thickening and increased size of additional consolidative nodularity in the left lower lobe. Findings are nonspecific and may reflect an infectious/inflammatory process with possible treatment failure, however underlying malignancy is a pertinent differential consideration warranting further evaluation. Suggest pulmonary consultation with consideration for direct tissue sampling and/or nuclear medicine PET-CT. 2. Prominent mediastinal lymph nodes are nonspecific but similar prior suggest continued attention on follow-up imaging. 3. Similar findings of basilar predominant interstitial lung disease. 4. Aortic atherosclerosis. Aortic Atherosclerosis (ICD10-I70.0). Electronically Signed   By: Reyes Holder M.D.   On: 02/03/2024 15:06

## 2024-02-22 NOTE — TOC Progression Note (Signed)
 Transition of Care [facility]) - Progression Note    Patient Details  Name: Bradley Ferguson MRN: [id number] Date of Birth: 20-May-1951  Transition of Care [facility] CM/SW Contact  [name], [place] Phone Number: 02/22/2024, 11:10 AM  Clinical Narrative:   CSW following to start insurance authorization closer to patient being medically ready for dc.CSW will continue to follow and assist with patients dc planning needs.     Expected Discharge Plan: Skilled Nursing Facility Barriers to Discharge: Continued Medical Work up, SNF Pending bed offer, Insurance Authorization               Expected Discharge Plan and Services In-house Referral: Clinical Social Work     Living arrangements for the past 2 months: Single Family Home                                       Social Drivers of Health (SDOH) Interventions SDOH Screenings   Food Insecurity: Food Insecurity Present (02/03/2024)  Housing: Low Risk  (02/03/2024)  Transportation Needs: No Transportation Needs (02/03/2024)  Utilities: Not At Risk (02/03/2024)  Alcohol Screen: Low Risk  (12/29/2023)  Depression (PHQ2-9): Medium Risk (11/13/2023)  Financial Resource Strain: Medium Risk (12/29/2023)  Physical Activity: Inactive (12/29/2023)  Social Connections: Socially Isolated (02/03/2024)  Stress: Stress Concern Present (12/29/2023)  Tobacco Use: High Risk (02/16/2024)  Health Literacy: Adequate Health Literacy (11/13/2023)    Readmission Risk Interventions    02/04/2024    3:23 PM 11/10/2023    2:54 PM  Readmission Risk Prevention Plan  Post Dischage Appt  Complete  Medication Screening  Complete  Transportation Screening Complete Complete  PCP or Specialist Appt within 5-7 Days Complete   Home Care Screening Complete   Medication Review (RN CM) Complete

## 2024-02-22 NOTE — Plan of Care (Signed)
  Problem: Coping: Goal: Level of anxiety will decrease Outcome: Progressing   Problem: Pain Managment: Goal: General experience of comfort will improve and/or be controlled Outcome: Progressing   Problem: Safety: Goal: Ability to remain free from injury will improve Outcome: Progressing   Problem: Respiratory: Goal: Ability to maintain adequate ventilation will improve Outcome: Progressing Goal: Ability to maintain a clear airway will improve Outcome: Progressing

## 2024-02-23 DIAGNOSIS — Z227 Latent tuberculosis: Secondary | ICD-10-CM | POA: Diagnosis not present

## 2024-02-23 DIAGNOSIS — R918 Other nonspecific abnormal finding of lung field: Secondary | ICD-10-CM | POA: Diagnosis not present

## 2024-02-23 DIAGNOSIS — J410 Simple chronic bronchitis: Secondary | ICD-10-CM | POA: Diagnosis not present

## 2024-02-23 DIAGNOSIS — J189 Pneumonia, unspecified organism: Secondary | ICD-10-CM | POA: Diagnosis not present

## 2024-02-23 LAB — CBC
HCT: 27.5 % — ABNORMAL LOW (ref 39.0–52.0)
Hemoglobin: 8.7 g/dL — ABNORMAL LOW (ref 13.0–17.0)
MCH: 25.8 pg — ABNORMAL LOW (ref 26.0–34.0)
MCHC: 31.6 g/dL (ref 30.0–36.0)
MCV: 81.6 fL (ref 80.0–100.0)
Platelets: 432 K/uL — ABNORMAL HIGH (ref 150–400)
RBC: 3.37 MIL/uL — ABNORMAL LOW (ref 4.22–5.81)
RDW: 22.1 % — ABNORMAL HIGH (ref 11.5–15.5)
WBC: 44.1 K/uL — ABNORMAL HIGH (ref 4.0–10.5)
nRBC: 0 % (ref 0.0–0.2)

## 2024-02-23 LAB — MISC LABCORP TEST (SEND OUT): Labcorp test code: 9985

## 2024-02-23 LAB — MAGNESIUM: Magnesium: 1.8 mg/dL (ref 1.7–2.4)

## 2024-02-23 LAB — MRSA NEXT GEN BY PCR, NASAL: MRSA by PCR Next Gen: NOT DETECTED

## 2024-02-23 LAB — BASIC METABOLIC PANEL WITH GFR
Anion gap: 11 (ref 5–15)
BUN: 17 mg/dL (ref 8–23)
CO2: 28 mmol/L (ref 22–32)
Calcium: 9.2 mg/dL (ref 8.9–10.3)
Chloride: 100 mmol/L (ref 98–111)
Creatinine, Ser: 0.79 mg/dL (ref 0.61–1.24)
GFR, Estimated: >60 mL/min (ref >60)
Glucose, Bld: 128 mg/dL — ABNORMAL HIGH (ref 70–99)
Potassium: 3.4 mmol/L — ABNORMAL LOW (ref 3.5–5.1)
Sodium: 139 mmol/L (ref 135–145)

## 2024-02-23 LAB — BRAIN NATRIURETIC PEPTIDE: B Natriuretic Peptide: 597.1 pg/mL — ABNORMAL HIGH (ref 0.0–100.0)

## 2024-02-23 LAB — PHOSPHORUS: Phosphorus: 3.6 mg/dL (ref 2.5–4.6)

## 2024-02-23 MED ORDER — PREDNISONE 10 MG PO TABS
10.0000 mg | ORAL_TABLET | Freq: Every day | ORAL | Status: AC
  Administered 2024-02-27 – 2024-02-28 (×2): 10 mg via ORAL
  Filled 2024-02-23 (×2): qty 1

## 2024-02-23 MED ORDER — FUROSEMIDE 10 MG/ML IJ SOLN
40.0000 mg | Freq: Once | INTRAMUSCULAR | Status: AC
  Administered 2024-02-23: 40 mg via INTRAVENOUS
  Filled 2024-02-23: qty 4

## 2024-02-23 MED ORDER — PREDNISONE 20 MG PO TABS
30.0000 mg | ORAL_TABLET | Freq: Every day | ORAL | Status: AC
  Administered 2024-02-24: 30 mg via ORAL
  Filled 2024-02-23 (×2): qty 1

## 2024-02-23 MED ORDER — SODIUM CHLORIDE 0.9 % IV SOLN
60.0000 mg | Freq: Once | INTRAVENOUS | Status: AC
  Administered 2024-02-23: 60 mg via INTRAVENOUS
  Filled 2024-02-23: qty 20

## 2024-02-23 MED ORDER — PREDNISONE 20 MG PO TABS
20.0000 mg | ORAL_TABLET | Freq: Every day | ORAL | Status: AC
  Administered 2024-02-25 – 2024-02-26 (×2): 20 mg via ORAL
  Filled 2024-02-23 (×2): qty 1

## 2024-02-23 MED ORDER — POTASSIUM CHLORIDE CRYS ER 20 MEQ PO TBCR
40.0000 meq | EXTENDED_RELEASE_TABLET | ORAL | Status: AC
  Administered 2024-02-23 (×2): 40 meq via ORAL
  Filled 2024-02-23 (×2): qty 2

## 2024-02-23 MED ORDER — MAGNESIUM SULFATE 2 GM/50ML IV SOLN
2.0000 g | Freq: Once | INTRAVENOUS | Status: AC
  Administered 2024-02-23: 2 g via INTRAVENOUS
  Filled 2024-02-23: qty 50

## 2024-02-23 MED ORDER — METOPROLOL TARTRATE 25 MG PO TABS
37.5000 mg | ORAL_TABLET | Freq: Two times a day (BID) | ORAL | Status: DC
  Administered 2024-02-23 – 2024-02-28 (×11): 37.5 mg via ORAL
  Filled 2024-02-23 (×11): qty 1

## 2024-02-23 NOTE — Progress Notes (Signed)
 Physical Therapy Treatment Patient Details Name: Bradley Ferguson MRN: [id number] DOB: 25-Jun-1951 Today's Date: 02/23/2024   History of Present Illness 73 y.o. male admitted 02/03/24 for near syncope at pulmonologist appt. 8/1 CT with increase in size of dense masslike consolidation & pleural effusion. Work up revealed non-small cell carcinoma with limited treatment options. PMH: COPD, ILD, Afib, HTN, PAD s/p bil fem endarerectomy, HLD, raynaud's syndrome, latent tuberculosis, tobacco abuse    PT Comments  Pt received in recliner, with notable increase in confusion, tangential speech, and decreased awareness. Pt initially only agreeable to LE movements but declining all further transfers and gait due to fatigue and difficulty breathing (SpO2 of 86% on 3L upon  my arrival, increased to 4 with SpO2 93%). However, I noticed pt was soiled of bowel movement and although he initially declined standing for cleaning, he was eventually agreeable with arrival of RN and increase of O2 to 5L for transfer. Pt with poor standing tolerance, needing minA and BUE support to maintain for <2 min. Pt then so fatigued he was unable to complete any further movement or transfers. Will continue to follow acutely as pt stated in session he did not want PT to stop attempting to work with him, but I also noted in chart that Oncology recommends hospice care, so will continue attempts only as it aligns with pt's goals of care.     If plan is discharge home, recommend the following: Assist for transportation;Help with stairs or ramp for entrance;Two people to help with walking and/or transfers;Two people to help with bathing/dressing/bathroom;Assistance with cooking/housework;Assistance with feeding;Direct supervision/assist for medications management;Direct supervision/assist for financial management;Supervision due to cognitive status   Can travel by private vehicle     No  Equipment Recommendations  Wheelchair (measurements  PT);Wheelchair cushion (measurements PT)    Recommendations for Other Services       Precautions / Restrictions Precautions Precautions: Fall;Other (comment) Recall of Precautions/Restrictions: Impaired Precaution/Restrictions Comments: watch sats, on 3-5L this session Restrictions Weight Bearing Restrictions Per Provider Order: No     Mobility  Bed Mobility Overal bed mobility: Needs Assistance             General bed mobility comments: pt OOB in recliner at start and end of session    Transfers Overall transfer level: Needs assistance Equipment used: Rolling walker (2 wheels) Transfers: Sit to/from Stand Sit to Stand: Mod assist           General transfer comment: modA of 1-2 to rise from recliner for cleaning, limited to 1-2 min standing before needing seated rest. Spo2 to 88% on 5L, unable to stand again       Balance Overall balance assessment: Needs assistance, History of Falls Sitting-balance support: No upper extremity supported, Feet supported Sitting balance-Leahy Scale: Good     Standing balance support: Bilateral upper extremity supported, Reliant on assistive device for balance, During functional activity Standing balance-Leahy Scale: Poor Standing balance comment: reliant on RW                            Communication Communication Communication: Other (comment) (tangential, limited by cognition and SOB) Factors Affecting Communication: Difficulty expressing self  Cognition Arousal: Alert Behavior During Therapy: Anxious   PT - Cognitive impairments: No family/caregiver present to determine baseline, Orientation, Awareness, Memory, Problem solving, Safety/Judgement   Orientation impairments: Place, Situation, Time  PT - Cognition Comments: pt attempting to discuss events of night and morning, discusing large groups of people watching him but unable to explain who they are or what is going on. at one point  said he wished he had the money to get a sheriff and a lawyer to take me to the hospital but later states he knows he is in the hospital. soiled of BM and initially unwilling to get cleaned Following commands: Impaired Following commands impaired: Follows one step commands inconsistently, Follows one step commands with increased time    Cueing Cueing Techniques: Verbal cues, Gestural cues, Tactile cues  Exercises General Exercises - Lower Extremity Ankle Circles/Pumps: AROM, Both, 10 reps Quad Sets: AROM, Both, 5 reps Heel Slides: AROM, Both, 5 reps    General Comments General comments (skin integrity, edema, etc.): SpO2 86% on 3L upon my arrival at rest, placed on 4L, then needing increase to 5L with SpO2 still dropping to 88% after single stand with pt with increased work of breathing      Pertinent Vitals/Pain Pain Assessment Pain Assessment: No/denies pain Pain Intervention(s): Limited activity within patient's tolerance, Monitored during session     PT Goals (current goals can now be found in the care plan section) Acute Rehab PT Goals Patient Stated Goal: to breathe better PT Goal Formulation: With patient Time For Goal Achievement: 03/08/24 Potential to Achieve Goals: Poor Progress towards PT goals: Not progressing toward goals - comment    Frequency    Min 1X/week       AM-PAC PT 6 Clicks Mobility   Outcome Measure  Help needed turning from your back to your side while in a flat bed without using bedrails?: A Lot Help needed moving from lying on your back to sitting on the side of a flat bed without using bedrails?: A Lot Help needed moving to and from a bed to a chair (including a wheelchair)?: A Lot Help needed standing up from a chair using your arms (e.g., wheelchair or bedside chair)?: A Lot Help needed to walk in hospital room?: Total Help needed climbing 3-5 steps with a railing? : Total 6 Click Score: 10    End of Session Equipment Utilized During  Treatment: Gait belt Activity Tolerance: Patient limited by fatigue Patient left: with call bell/phone within reach;with bed alarm set;in chair Nurse Communication: Mobility status PT Visit Diagnosis: Other abnormalities of gait and mobility (R26.89);Difficulty in walking, not elsewhere classified (R26.2);Muscle weakness (generalized) (M62.81);Unsteadiness on feet (R26.81)     Time: 8782-8751 PT Time Calculation (min) (ACUTE ONLY): 31 min  Charges:    $Therapeutic Exercise: 8-22 mins $Therapeutic Activity: 8-22 mins PT General Charges $$ ACUTE PT VISIT: 1 Visit                     Izetta Call, PT, DPT   Acute Rehabilitation Department Office [phone] Secure Chat Communication Preferred   Izetta JULIANNA Call 02/23/2024, 1:51 PM

## 2024-02-23 NOTE — Progress Notes (Signed)
 0145--0230: Pt refusing Iv antibiotics, attributing the treatment to him seeing things, doing things, and hearing things. Pt received education and provided emotional support. Pt is Aox3-4 at this time, with some notable signs of possible delirium yet remains oriented enough to converse informed regarding treatment. Pt does not believe staff has best intentions and that the medication is harming him. Pt requested to speak with on-call provider, C. Lenon, MD who could come to bedside but advised that pt could speak with rounding team in the morning.   Of note. Pt was lying in bed in his own feces and was now refusing to be cleaned for the second time this shift. Showing pt his chart to prove antibiotics were advised, giving multiple points of education and how they related to overall plan of care, informing the possible outcomes if left untreated, and updating pt on the availability to speak with morning providers, the pt agreed to not have the antibiotic (see MAR) but relented on allowing RN to clean him. Emotional support given throughout encounter.

## 2024-02-23 NOTE — Progress Notes (Signed)
 [name] clearly is confused.  I saw him this morning.  I read the note from the nurse earlier in the night.  The only thing that I can  think of is that the fentanyl  patch might be causing some of the confusion.  As such, we probably should discontinue this.  Clearly, his overall status is just no better.  He definitely is a Hospice candidate.  He is clearly not a candidate for any treatment.  He still has a lot of congestion.  He has a little bit of a cough.  It is hard to say how well he is eating.  I think his calcium  might be on the higher side.  I think his corrected calcium  is 11.2.  It probably would not hurt to give him something to bring this down.  Again, his overall status is really not any better.  This is all about quality of life.  I really do think that Hospice would be best for him.  I cannot imagine that given his current status that he will make it through the month of September.  The high white cells could easily be from the steroids that he is on.    His CBC shows white count 44.1.  Hemoglobin 8.7.  Platelet count 432,000.  Again, he is quite confused.  He is pleasant.  He is quite talkative.  All of his vital signs look pretty good.  Temperature is 97.6.  Pulse 76.  Blood pressure 113/80.  Oxygen saturation is 92%.  His lungs do sound congested.  He has wheezing bilaterally.  Cardiac exam regular rate and rhythm.  He has occasional extra beat.  Abdomen is soft.  Bowel sounds are present.  He has no fluid wave.  Extremity shows no clubbing, cyanosis or edema.  Neurological exam shows no focal deficits.  Again, he is quite confused.  I do believe that the confusion is probably from medications.  This could certainly be from the fentanyl .  I will stop this.  It could also be from the prednisone  that he is on.  Again, I think Hospice is really going to be necessary for him.  I am not sure what the discharge plans are for him for where he would be going.  I just want  him to have some quality of life.  I know the staff up on [address] are doing a good job with him.   Jeralyn Crease, MD  Ila 26:4

## 2024-02-23 NOTE — Consult Note (Signed)
Consultation Note Date: 02/23/2024   Patient Name: Bradley Ferguson  DOB: 1951-05-09  MRN: [id number]  Age / Sex: 73 y.o., male  PCP: Sebastian Beverley NOVAK, MD Referring Physician: Perri DELENA Meliton Mickey., *  Reason for Consultation: Goals of Care  HPI/Patient Profile: 73 y.o. male  with past medical history of *** admitted on 02/03/2024 with ***.  73 y.o. male with medical history significant of patient with history of COPD, interstitial lung disease, patient has latent tuberculosis, atrial fibrillation on Tikosyn  and Xarelto , peripheral arterial disease, essential hypertension, anxiety disorder depression, history of hematuria with lung consolidation was on antibiotics by pulmonary.     Patient went to see pulmonary on the day of admission due to near syncope and productive cough.  He was found to have significant dyspnea and tachycardia with heart rate in the 120s.  Patient noted to have oxygen saturation 90% on room air.  He was however noted to have hypercalcemia, leukocytosis with left shift and lactic acidosis.     He was admitted for acute hypoxemic respiratory failure in the setting of left upper lobe consolidation and underwent bronchoscopy on 8/19.  Now noted to have worsening leukocytosis and remains on IV antibiotics with BAL showing 20,000 colonies of yeast. He continues to have some worsening leukocytosis that is suspected related to new cancer diagnosis with concern for NSCLC.     Oncology currently recommending hospice.  Palliative care has been consulted to help with goals of care conversations.     Currently on steroid taper for COPD exacerbation, receiving lasix  as tolerated.  Clinical Assessment and Goals of Care:  This NP Ronal Plants reviewed medical records, received report from team, assessed the patient and then meet at the patient's bedside  to discuss diagnosis, prognosis, GOC, EOL wishes  disposition and options.   Concept of Palliative Care was introduced as specialized medical care for people and their families living with serious illness.  If focuses on providing relief from the symptoms and stress of a serious illness.  The goal is to improve quality of life for both the patient and the family.  Created space and opportunity for patient  and family to explore thoughts and feelings regarding current medical situation     A  discussion was had today regarding advanced directives.  Concepts specific to code status, artifical feeding and hydration, continued IV antibiotics and rehospitalization was had.  The difference between a aggressive medical intervention path  and a palliative comfort care path for this patient at this time was had.  Values and goals of care important to patient and family were attempted to be elicited.       Questions and concerns addressed.  Patient  encouraged to call with questions or concerns.     PMT will continue to support holistically.           No documented HPOA of ACP documents      SUMMARY OF RECOMMENDATIONS    Code Status/Advance Care Planning: DNR_ limited- previously documented in EMR  Symptom Management:  Per attending  Palliative Prophylaxis:  {Palliative Prophylaxis:21015}  Additional Recommendations (Limitations, Scope, Preferences): {Recommended Scope and Preferences:21019}  Psycho-social/Spiritual:  Desire for further Chaplaincy support:- no declined Additional Recommendations: Emotional support  Prognosis:  Poor prognosis, likey less tha 6 months  Discharge Planning: {Palliative dispostion:23505}      Primary Diagnoses: Present on Admission:  Severe sepsis (HCC)  ANXIETY DEPRESSION  Atrial fibrillation (HCC)  Bipolar I disorder (HCC)  Peripheral arterial disease (HCC)  Chronic obstructive pulmonary disease (HCC)  Interstitial pulmonary disease (HCC)  TB lung, latent  Essential hypertension   Hypercalcemia  Mass of upper lobe of left lung  Mediastinal adenopathy   I have reviewed the medical record, interviewed the patient and family, and examined the patient. The following aspects are pertinent.  Past Medical History:  Diagnosis Date   Anxiety    Atrial fibrillation (HCC)    Blood transfusion without reported diagnosis    Cataract    Depression    Emphysema of lung (HCC)    Heart murmur    Hyperlipidemia    Hypertension    PVD (peripheral vascular disease) (HCC)    Raynaud's syndrome    Substance abuse (HCC)    Tobacco   Systemic sclerosis (HCC)    TB lung, latent 12/19/2019   s/p INH x 3 months   Transaminitis 06/18/2020   Social History   Socioeconomic History   Marital status: Divorced    Spouse name: Not on file   Number of children: 1   Years of education: Not on file   Highest education level: Associate degree: occupational, Scientist, product/process development, or vocational program  Occupational History   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  Psychologist, educational Use   Vaping status: Never Used  Substance and Sexual Activity   Alcohol use: Yes    Alcohol/week: 2.0 standard drinks of alcohol    Types: 2 Standard drinks or equivalent per week    Comment: Social   Drug use: Yes    Frequency: 1.0 times per week    Types: Oxycodone    Sexual activity: Not Currently    Birth control/protection: None  Other Topics Concern   Not on file  Social History Narrative   Not on file   Social Drivers of Health   Financial Resource Strain: Medium Risk (12/29/2023)   Overall Financial Resource Strain (CARDIA)    Difficulty of Paying Living Expenses: Somewhat hard  Food Insecurity: Food  Insecurity Present (02/03/2024)   Hunger Vital Sign    Worried About Running Out of Food in the Last Year: Sometimes true    Ran Out of Food in the Last Year: Sometimes true  Transportation Needs: No Transportation Needs (02/03/2024)   PRAPARE - Administrator, Civil Service (Medical): No    Lack of Transportation (Non-Medical): No  Physical Activity: Inactive (12/29/2023)   Exercise Vital Sign    Days of Exercise per Week: 0 days    Minutes of Exercise per Session: Not on file  Stress: Stress Concern Present (12/29/2023)   [employer] of Occupational Health - Occupational Stress Questionnaire  Feeling of Stress: Rather much  Social Connections: Socially Isolated (02/03/2024)   Social Connection and Isolation Panel    Frequency of Communication with Friends and Family: More than three times a week    Frequency of Social Gatherings with Friends and Family: Once a week    Attends Religious Services: Never    Database administrator or Organizations: No    Attends Banker Meetings: Never    Marital Status: Widowed   Family History  Problem Relation Age of Onset   Cancer Mother        small cell lung cancer   Cancer Father        unsure type   Colon cancer Neg Hx    Rectal cancer Neg Hx    Heart disease Neg Hx    Pancreatic cancer Neg Hx    Stomach cancer Neg Hx    Esophageal cancer Neg Hx    Scheduled Meds:  budesonide -glycopyrrolate -formoterol   2 puff Inhalation BID   dofetilide   500 mcg Oral BID   gabapentin   300 mg Oral TID   metoprolol  tartrate  37.5 mg Oral BID   polyethylene glycol  17 g Oral Daily   predniSONE   30 mg Oral Q breakfast   Followed by   NOREEN ON 02/25/2024] predniSONE   20 mg Oral Q breakfast   Followed by   NOREEN ON 02/11/2024] predniSONE   10 mg Oral Q breakfast   pyridOXINE   100 mg Oral QPM   rivaroxaban   20 mg Oral Q supper   Continuous Infusions:  piperacillin -tazobactam 3.375 g (02/23/24 1702)   PRN Meds:.albuterol ,  bisacodyl , bisacodyl , chlorpheniramine-HYDROcodone , ketoconazole , metoprolol  tartrate, morphine , mouth rinse, oxyCODONE , triamcinolone  cream Medications Prior to Admission:  Prior to Admission medications   Medication Sig Start Date End Date Taking? Authorizing Provider  albuterol  (PROVENTIL ) (2.5 MG/3ML) 0.083% nebulizer solution Take 3 mLs (2.5 mg total) by nebulization every 6 (six) hours as needed for wheezing or shortness of breath. 01/12/24  Yes Sebastian Beverley NOVAK, MD  albuterol  (VENTOLIN  HFA) 108 [phone] Base) MCG/ACT inhaler Use  2 inhalations  15 minutes apart  every 4 hours  to rescue Asthma Attack 05/20/21  Yes Tonita Fallow, MD  budesonide -glycopyrrolate -formoterol  (BREZTRI  AEROSPHERE) 160-9-4.8 MCG/ACT AERO inhaler Inhale 2 puffs into the lungs in the morning and at bedtime. 01/21/24  Yes Tamea Dedra CROME, MD  Cholecalciferol  250 MCG (10000 UT) CAPS Take 10,000 Units by mouth every evening.   Yes [provider]  dofetilide  (TIKOSYN ) 500 MCG capsule TAKE 1 CAPSULE BY MOUTH 2 TIMES DAILY. 08/17/23  Yes Court Dorn PARAS, MD  gabapentin  (NEURONTIN ) 300 MG capsule Take 1 capsule 3 x /day for Neuropathy Pain Patient taking differently: Take 300 mg by mouth 3 (three) times daily. 08/14/23  Yes Webb, Padonda B, FNP  ketoconazole  (NIZORAL ) 2 % cream Apply 1 Application topically daily as needed for irritation.   Yes [provider]  oxyCODONE  (OXY IR/ROXICODONE ) 5 MG immediate release tablet Take 5 mg by mouth every 4 (four) hours as needed for severe pain (pain score 7-10).   Yes [provider]  pyridOXINE  (VITAMIN B-6) 100 MG tablet Take 100 mg by mouth every evening.   Yes [provider]  triamcinolone  cream (KENALOG ) 0.5 % Apply 1 Application topically daily as needed (skin irritation).   Yes [provider]  atorvastatin  (LIPITOR ) 80 MG tablet Take 1 tablet Daily for Cholesterol Patient taking differently: Take 80 mg by mouth daily. Take 1 tablet  Daily for  Cholesterol 08/13/23   Douglass Caul B, FNP  rivaroxaban  (XARELTO ) 20 MG TABS tablet Take 1 tablet (20 mg total) by mouth daily with supper. Patient not taking: Reported on 02/03/2024 12/01/23   Elna Loud P, PA-C   Allergies  Allergen Reactions   Cymbalta  [Duloxetine  Hcl]     unknown   Effexor [Venlafaxine]     Feels bad   Wellbutrin  [Bupropion ]     sleepy   Zoloft [Sertraline Hcl]     Felt bad   Review of Systems  Physical Exam  Vital Signs: BP 106/81 (BP Location: Left Arm)   Pulse 94   Temp 98 F (36.7 C) (Oral)   Resp 16   Ht 6' 0.99 (1.854 m)   Wt 72.2 kg   SpO2 91%   BMI 21.00 kg/m  Pain Scale: 0-10 POSS *See Group Information*: 1-Acceptable,Awake and alert Pain Score: 7    SpO2: SpO2: 91 % O2 Device:SpO2: 91 % O2 Flow Rate: .O2 Flow Rate (L/min): 3 L/min  IO: Intake/output summary:  Intake/Output Summary (Last 24 hours) at 02/23/2024 1812 Last data filed at 02/23/2024 1559 Gross per 24 hour  Intake --  Output 1425 ml  Net -1425 ml    LBM: Last BM Date : 02/22/24 Baseline Weight: Weight: 65.2 kg Most recent weight: Weight: 72.2 kg     Palliative Assessment/Data:     Time  75 minutes  Signed by: Ronal Plants, NP   Please contact Palliative Medicine Team phone at [phone] for questions and concerns.  For individual provider: See Tracey

## 2024-02-23 NOTE — Progress Notes (Signed)
 Mobility Specialist Progress Note:    02/23/24 1122  Mobility  Activity Pivoted/transferred from bed to chair  Level of Assistance Moderate assist, patient does 50-74%  Assistive Device Front wheel walker  Distance Ambulated (ft) 3 ft  Activity Response Tolerated fair  Mobility Referral Yes  Mobility visit 1 Mobility  Mobility Specialist Start Time (ACUTE ONLY) 1122  Mobility Specialist Stop Time (ACUTE ONLY) 1142  Mobility Specialist Time Calculation (min) (ACUTE ONLY) 20 min   Pt reluctant but willing to ambulate to recliner for lunch. C/o feeling like he is not receiving enough O2 (RN knocked him down to 3L). Pt requiring mod to be able to stand and ambulate; legs seemed wobbly. Left pt in recliner w/ all needs met.  Venetia Keel Mobility Specialist Please Neurosurgeon or Rehab Office at [phone]

## 2024-02-23 NOTE — TOC Progression Note (Signed)
 Transition of Care [facility]) - Progression Note    Patient Details  Name: Bradley Ferguson MRN: [id number] Date of Birth: 1950/09/29  Transition of Care [facility]) CM/SW Contact  [name], [place] Phone Number: 02/23/2024, 12:06 PM  Clinical Narrative:   Palliative consulted at this time.  CSW will continue to follow.   Expected Discharge Plan: Skilled Nursing Facility Barriers to Discharge: Continued Medical Work up, SNF Pending bed offer, Insurance Authorization               Expected Discharge Plan and Services In-house Referral: Clinical Social Work     Living arrangements for the past 2 months: Single Family Home                                       Social Drivers of Health (SDOH) Interventions SDOH Screenings   Food Insecurity: Food Insecurity Present (02/03/2024)  Housing: Low Risk  (02/03/2024)  Transportation Needs: No Transportation Needs (02/03/2024)  Utilities: Not At Risk (02/03/2024)  Alcohol Screen: Low Risk  (12/29/2023)  Depression (PHQ2-9): Medium Risk (11/13/2023)  Financial Resource Strain: Medium Risk (12/29/2023)  Physical Activity: Inactive (12/29/2023)  Social Connections: Socially Isolated (02/03/2024)  Stress: Stress Concern Present (12/29/2023)  Tobacco Use: High Risk (02/16/2024)  Health Literacy: Adequate Health Literacy (11/13/2023)    Readmission Risk Interventions    02/04/2024    3:23 PM 11/10/2023    2:54 PM  Readmission Risk Prevention Plan  Post Dischage Appt  Complete  Medication Screening  Complete  Transportation Screening Complete Complete  PCP or Specialist Appt within 5-7 Days Complete   Home Care Screening Complete   Medication Review (RN CM) Complete

## 2024-02-23 NOTE — Progress Notes (Signed)
 PROGRESS NOTE    Bradley Ferguson  FMW:[id number] DOB: Feb 17, 1951 DOA: 02/03/2024 PCP: Sebastian Beverley NOVAK, MD  Chief Complaint  Patient presents with   Pneumonia    Brief Narrative:   73 y.o. male with medical history significant of patient with history of COPD, interstitial lung disease, patient has latent tuberculosis, atrial fibrillation on Tikosyn  and Xarelto , peripheral arterial disease, essential hypertension, anxiety disorder depression, history of hematuria with lung consolidation was on antibiotics by pulmonary.    Patient went to see pulmonary on the day of admission due to near syncope and productive cough.  He was found to have significant dyspnea and tachycardia with heart rate in the 120s.  Patient noted to have oxygen saturation 90% on room air.  He was however noted to have hypercalcemia, leukocytosis with left shift and lactic acidosis.    He was admitted for acute hypoxemic respiratory failure in the setting of left upper lobe consolidation and underwent bronchoscopy on 8/19.  Now noted to have worsening leukocytosis and remains on IV antibiotics with BAL showing 20,000 colonies of yeast. He continues to have some worsening leukocytosis that is suspected related to new cancer diagnosis with concern for NSCLC.    Oncology currently recommending hospice.  Palliative care has been consulted to help with goals of care conversations.    Currently on steroid taper for COPD exacerbation, receiving lasix  as tolerated.   Assessment & Plan:   Principal Problem:   Mass of upper lobe of left lung Active Problems:   ANXIETY DEPRESSION   Bipolar I disorder (HCC)   Atrial fibrillation (HCC)   Peripheral arterial disease (HCC)   Chronic obstructive pulmonary disease (HCC)   Interstitial pulmonary disease (HCC)   TB lung, latent   Essential hypertension   Severe sepsis (HCC)   Hypercalcemia   Mediastinal adenopathy  Goals of Care Oncology recommending hospice.  Will c/s  palliative care to help us  with goc discussions.    Delirium Issues overnight, ? Hallucinations Delirium precautions, fentanyl  has been d/c'd by oncology Tapering steroids Will monitor and w/u as indicated  Acute hypoxic respiratory failure in the setting of left upper lobe consolidation  CT scan on 8/1 showed slightly increase of the left upper lobe dense consolidation, concerning for infection versus malignancy.  He has been having worsening shortness of breath, cough, hemoptysis on presentation.   Legionella negative, negative urine strep.  Histoplasma antigen negative. Fungitell beta D glucan negative. Blastomyces antigen negative as well - currently on 4 L Pavo - CXR 8/25 with findings concerning for RUL pneumonia - cavitary mass in LUL, L effusion - Given history of latent TB, he underwent AFB, now negative x 3 - s/p bronch on 8/19, negative acid fast smear, culture with candida albicans - fungal culture pending - per pulm 8/22, yeast in BAL likely colonization, no need for antifungal  - s/p zosyn  8/11-8/25.  Recently afebrile, but continued significant leukocytosis.  Will check MRSA PCR. - Status post bronchoscopy 8/19 with cultures showing some yeast; appreciate pulmonary recommendations - thought to be colonizer, no antifungal needed.   -Noted to have worsening leukocytosis likely in the setting of cancer/steroids.  No fever noted.  Continue CBC monitoring. -steroid taper, breztria, albuterol  prn in setting of COPD exacerbation. -will diurese as tolerated with elevated BNP  Stage IV Lung Cancer Surg path with non small cell carcinoma CT chest with mass like consolidation in left upper lobe, prominent mediastinal LN's.  CT abd with mediastinal LAD, increased in size.  Heterogenously enhancing nodule lateral to L psoas concerning for metastatic disease.   Seen by Dr. Timmy, right now planning for hospice it seems.  Planning to send tumor for molecular analysis.     Hypercalcemia of  malignancy-over 15 on presentation.  PTH appropriately low, PTH RP less than 2.   Received fluids, Lasix  Improved, corrected calcium  is 10.8 - follow with lasix    Acute kidney injury-resolved, creatinine back to baseline   Pulmonary fibrosis, emphysema-contributing to his dyspnea   Persistent Crissy Mccreadie-fib  RVR continue dofetilide .  Transition to xarelto  from heparin  today. Will titrate metoprolol  with continued RVR    Concern for severe sepsis sepsis physiology resolved.   Likely due to his possible upper lobe consolidation.  Noted to have persistent leukocytosis which has worsened since admission, monitor.  Currently afebrile.    Hypokalemia Replace and follow, goal K>4 and mag>2 with tikosyn   Bipolar 1 disorder-continue home regimen   Abnormal Echo Needs repeat limited echo for doppler assessment of AV to rule out AS    DVT prophylaxis: xarelto  Code Status: DNR Family Communication: none Disposition:   Status is: Inpatient Remains inpatient appropriate because: need for continued inpatient care   Consultants:  Pulm oncology  Procedures:  Thoracentesis  Flexible video fiberoptic bronchoscopy with endobronchial US  and biopsies  IMPRESSIONS     1. Left ventricular ejection fraction, by estimation, is 60 to 65%. The  left ventricle has normal function. The left ventricle has no regional  wall motion abnormalities. Left ventricular diastolic parameters are  indeterminate. Elevated left atrial  pressure.   2. Right ventricular systolic function is normal. The right ventricular  size is normal. There is normal pulmonary artery systolic pressure. The  estimated right ventricular systolic pressure is 31.7 mmHg.   3. The mitral valve is degenerative. No evidence of mitral valve  regurgitation. Mild mitral stenosis. The mean mitral valve gradient is 5.0  mmHg. Moderate mitral annular calcification.   4. The aortic valve was not well visualized. There is severe calcifcation   of the aortic valve. There is severe thickening of the aortic valve.  Aortic valve regurgitation is not visualized. Doppler of AV not performed.   5. The inferior vena cava is normal in size with greater than 50%  respiratory variability, suggesting right atrial pressure of 3 mmHg.   6. Possible atrial fibrillation.   7. Needs repeat limited echo for doppler assessment of the aortic valve  to rule out aortic stenosis.   Antimicrobials:  Anti-infectives (From admission, onward)    Start     Dose/Rate Route Frequency Ordered Stop   02/08/24 1645  piperacillin -tazobactam (ZOSYN ) IVPB 3.375 g       Note to Pharmacy: Please change dosing if needed.   3.375 g 12.5 mL/hr over 240 Minutes Intravenous Every 8 hours 02/08/24 1549     02/05/24 1800  doxycycline  (VIBRA -TABS) tablet 100 mg        100 mg Oral Every 12 hours 02/05/24 1428 02/09/24 1721   02/04/24 1700  azithromycin  (ZITHROMAX ) 500 mg in sodium chloride  0.9 % 250 mL IVPB  Status:  Discontinued        500 mg 250 mL/hr over 60 Minutes Intravenous Every 24 hours 02/03/24 1945 02/03/24 2145   02/04/24 1600  cefTRIAXone  (ROCEPHIN ) 1 g in sodium chloride  0.9 % 100 mL IVPB  Status:  Discontinued        1 g 200 mL/hr over 30 Minutes Intravenous Every 24 hours 02/03/24 1945 02/08/24 1549  02/04/24 1600  doxycycline  (VIBRAMYCIN ) 100 mg in sodium chloride  0.9 % 250 mL IVPB  Status:  Discontinued        100 mg 125 mL/hr over 120 Minutes Intravenous Every 12 hours 02/03/24 2145 02/05/24 1428   02/03/24 1645  cefTRIAXone  (ROCEPHIN ) 2 g in sodium chloride  0.9 % 100 mL IVPB  Status:  Discontinued        2 g 200 mL/hr over 30 Minutes Intravenous Once 02/03/24 1643 02/03/24 1643   02/03/24 1645  azithromycin  (ZITHROMAX ) 500 mg in sodium chloride  0.9 % 250 mL IVPB  Status:  Discontinued        500 mg 250 mL/hr over 60 Minutes Intravenous  Once 02/03/24 1643 02/03/24 1643   02/03/24 1615  cefTRIAXone  (ROCEPHIN ) 1 g in sodium chloride  0.9 % 100 mL  IVPB        1 g 200 mL/hr over 30 Minutes Intravenous  Once 02/03/24 1604 02/03/24 1656   02/03/24 1615  azithromycin  (ZITHROMAX ) 500 mg in sodium chloride  0.9 % 250 mL IVPB        500 mg 250 mL/hr over 60 Minutes Intravenous  Once 02/03/24 1604 02/03/24 1801       Subjective:  Continued SOB  Objective: Vitals:   02/23/24 0422 02/23/24 0805 02/23/24 0834 02/23/24 0837  BP:  124/84    Pulse:  63    Resp:  20    Temp:  (!) 97.4 F (36.3 C)    TempSrc:  Oral    SpO2:  90% 90% 90%  Weight: 72.2 kg     Height:        Intake/Output Summary (Last 24 hours) at 02/23/2024 9093 Last data filed at 02/23/2024 9692 Gross per 24 hour  Intake 854.12 ml  Output 800 ml  Net 54.12 ml   Filed Weights   02/20/24 0327 02/22/24 0445 02/23/24 0422  Weight: 72.7 kg 72.8 kg 72.2 kg    Examination:  General: chronically ill appearing Cardiovascular: irregularly irregular, tachy Lungs: coarse breath sounds with scattered wheezing - tachypneic, increased WOB, but improved when compared to yesterday Abdomen: Soft, nontender, nondistended Neurological: Alert . Moves all extremities 4 with equal strength. Cranial nerves II through XII grossly intact. Skin: Warm and dry. No rashes or lesions. Extremities: No clubbing or cyanosis. No edema.  Data Reviewed: I have personally reviewed following labs and imaging studies  CBC: Recent Labs  Lab 02/19/24 0246 02/20/24 0246 02/21/24 0300 02/22/24 0226 02/23/24 0343  WBC 42.8* 38.2* 39.5* 47.1* 44.1*  NEUTROABS 37.9*  --  36.3* 42.9*  --   HGB 9.3* 8.4* 8.1* 8.9* 8.7*  HCT 29.3* 26.2* 26.0* 28.8* 27.5*  MCV 80.7 80.6 82.3 82.1 81.6  PLT 440* 434* 395 475* 432*    Basic Metabolic Panel: Recent Labs  Lab 02/19/24 0246 02/20/24 0246 02/21/24 0300 02/22/24 0226 02/23/24 0343  NA 137 134* 135 137 139  K 4.2 4.1 4.4 3.9 3.4*  CL 105 104 103 102 100  CO2 21* 23 25 24 28   GLUCOSE 105* 138* 102* 110* 128*  BUN 11 13 13 17 17   CREATININE  0.79 0.82 0.73 0.82 0.79  CALCIUM  9.0 9.0 9.1 9.2 9.2  MG 1.9 1.9 1.9 1.8 1.8  PHOS  --   --  2.4* 1.9* 3.6    GFR: Estimated Creatinine Clearance: 85.2 mL/min (by C-G formula based on SCr of 0.79 mg/dL).  Liver Function Tests: Recent Labs  Lab 02/21/24 0300 02/22/24 0226  AST 26 29  ALT 21 24  ALKPHOS 110 114  BILITOT 0.4 0.5  PROT 5.3* 6.1*  ALBUMIN  1.7* 2.0*    CBG: No results for input(s): GLUCAP in the last 168 hours.   Recent Results (from the past 240 hours)  Culture, BAL-quantitative w Gram Stain     Status: Abnormal   Collection Time: 02/16/24  3:46 PM   Specimen: Bronchial Alveolar Lavage; Respiratory  Result Value Ref Range Status   Specimen Description BRONCHIAL ALVEOLAR LAVAGE  Final   Special Requests LUL  Final   Gram Stain   Final    FEW WBC PRESENT, PREDOMINANTLY PMN NO ORGANISMS SEEN Performed at [facility], [address], [place], [place] [address]    Culture 20,000 COLONIES/mL CANDIDA ALBICANS (Latoya Diskin)  Final   Report Status 02/19/2024 FINAL  Final  Acid Fast Smear (AFB)     Status: None   Collection Time: 02/16/24  3:46 PM   Specimen: Bronchial Alveolar Lavage; Respiratory  Result Value Ref Range Status   AFB Specimen Processing Concentration  Final   Acid Fast Smear Negative  Final    Comment: (NOTE) Performed At: [facility] [address] [place], [place] [id number] Jennette Shorter MD Ey:[id number]    Source (AFB) BRONCHIAL ALVEOLAR LAVAGE  Final    Comment: LUL Performed at [facility], [address] [place], [place] [address]   Anaerobic culture w Gram Stain     Status: None   Collection Time: 02/16/24  3:46 PM   Specimen: Bronchoalveolar Lavage  Result Value Ref Range Status   Specimen Description BRONCHIAL ALVEOLAR LAVAGE  Final   Special Requests LUL  Final   Gram Stain   Final    FEW WBC PRESENT, PREDOMINANTLY PMN NO ORGANISMS SEEN    Culture   Final    NO ANAEROBES ISOLATED Performed at [facility], [address] [place], [place] [address]    Report Status 02/21/2024 FINAL  Final         Radiology Studies: ECHOCARDIOGRAM COMPLETE Result Date: 02/22/2024    ECHOCARDIOGRAM REPORT   Patient Name:   GABERIAL CADA Date of Exam: 02/22/2024 Medical Rec #:  [id number]      Height:       73.0 in Accession #:    [id number]     Weight:       160.5 lb Date of Birth:  October 27, 1950      BSA:          1.960 m Patient Age:    73 years       BP:           148/97 mmHg Patient Gender: M              HR:           110 bpm. Exam Location:  Inpatient Procedure: 2D Echo, Cardiac Doppler and Color Doppler (Both Spectral and Color            Flow Doppler were utilized during procedure). Indications:     Other abnormalities of the heart  History:         Patient has no prior history of Echocardiogram examinations.                  COPD and PAD; Risk Factors:Hypertension.  Sonographer:     Philomena Daring Referring Phys:  [id number] Izzak Fries CALDWELL POWELL JR Diagnosing Phys: Wilbert Bihari MD  Sonographer Comments: Patient was unable to remain still  during the exam IMPRESSIONS  1. Left ventricular ejection fraction, by estimation, is 60 to 65%. The left ventricle has normal function. The left ventricle has no regional wall motion abnormalities. Left ventricular diastolic parameters are indeterminate. Elevated left atrial pressure.  2. Right ventricular systolic function is normal. The right ventricular size is normal. There is normal pulmonary artery systolic pressure. The estimated right ventricular systolic pressure is 31.7 mmHg.  3. The mitral valve is degenerative. No evidence of mitral valve regurgitation. Mild mitral stenosis. The mean mitral valve gradient is 5.0 mmHg. Moderate mitral annular calcification.  4. The aortic valve was not well visualized. There is severe calcifcation of the aortic valve. There is severe thickening of the aortic valve. Aortic valve regurgitation is not visualized. Doppler of AV not performed.  5.  The inferior vena cava is normal in size with greater than 50% respiratory variability, suggesting right atrial pressure of 3 mmHg.  6. Possible atrial fibrillation.  7. Needs repeat limited echo for doppler assessment of the aortic valve to rule out aortic stenosis. FINDINGS  Left Ventricle: Left ventricular ejection fraction, by estimation, is 60 to 65%. The left ventricle has normal function. The left ventricle has no regional wall motion abnormalities. The left ventricular internal cavity size was normal in size. There is  no left ventricular hypertrophy. Left ventricular diastolic parameters are indeterminate. Elevated left atrial pressure. Right Ventricle: The right ventricular size is normal. No increase in right ventricular wall thickness. Right ventricular systolic function is normal. There is normal pulmonary artery systolic pressure. The tricuspid regurgitant velocity is 2.68 m/s, and  with an assumed right atrial pressure of 3 mmHg, the estimated right ventricular systolic pressure is 31.7 mmHg. Left Atrium: Left atrial size was normal in size. Right Atrium: Right atrial size was normal in size. Prominent Chiari network. Pericardium: There is no evidence of pericardial effusion. Mitral Valve: The mitral valve is degenerative in appearance. There is moderate thickening of the mitral valve leaflet(s). There is moderate calcification of the mitral valve leaflet(s). Moderate mitral annular calcification. No evidence of mitral valve regurgitation. Mild mitral valve stenosis. MV peak gradient, 10.4 mmHg. The mean mitral valve gradient is 5.0 mmHg. Tricuspid Valve: The tricuspid valve is normal in structure. Tricuspid valve regurgitation is not demonstrated. No evidence of tricuspid stenosis. Aortic Valve: The aortic valve was not well visualized. There is severe calcifcation of the aortic valve. There is severe thickening of the aortic valve. Aortic valve regurgitation is not visualized. Doppler of AV not  performed. Pulmonic Valve: The pulmonic valve was normal in structure. Pulmonic valve regurgitation is not visualized. No evidence of pulmonic stenosis. Aorta: The aortic root is normal in size and structure. Venous: The inferior vena cava is normal in size with greater than 50% respiratory variability, suggesting right atrial pressure of 3 mmHg. IAS/Shunts: No atrial level shunt detected by color flow Doppler.  LEFT VENTRICLE PLAX 2D LVOT diam:     1.89 cm   Diastology LV SV:         44        LV e' medial:    9.25 cm/s LV SV Index:   22        LV E/e' medial:  19.6 LVOT Area:     2.81 cm  LV e' lateral:   7.29 cm/s                          LV E/e' lateral: 24.8  RIGHT VENTRICLE             IVC RV S prime:     12.80 cm/s  IVC diam: 1.51 cm TAPSE (M-mode): 1.9 cm LEFT ATRIUM           Index        RIGHT ATRIUM           Index LA Vol (A4C): 52.7 ml 26.89 ml/m  RA Area:     19.40 cm                                    RA Volume:   50.30 ml  25.67 ml/m  AORTIC VALVE LVOT Vmax:   102.00 cm/s LVOT Vmean:  69.250 cm/s LVOT VTI:    0.157 m MITRAL VALVE                TRICUSPID VALVE MV Area (PHT): 2.91 cm     TR Peak grad:   28.7 mmHg MV Area VTI:   1.41 cm     TR Vmax:        268.00 cm/s MV Peak grad:  10.4 mmHg MV Mean grad:  5.0 mmHg     SHUNTS MV Vmax:       1.61 m/s     Systemic VTI:  0.16 m MV Vmean:      107.0 cm/s   Systemic Diam: 1.89 cm MV Decel Time: 261 msec MV E velocity: 181.00 cm/s MV Esly Selvage velocity: 69.40 cm/s MV E/Awesome Jared ratio:  2.61 Wilbert Bihari MD Electronically signed by Wilbert Bihari MD Signature Date/Time: 02/22/2024/4:16:57 PM    Final (Updated)    DG CHEST PORT 1 VIEW Result Date: 02/22/2024 CLINICAL DATA:  Shortness of breath EXAM: PORTABLE CHEST 1 VIEW COMPARISON:  Chest CT 01/29/2024 FINDINGS: Normal cardiac silhouette. Cavitary lesion in the LEFT upper lobe again noted. Moderate LEFT effusion unchanged. Increased airspace density in the RIGHT upper lobe. IMPRESSION: 1. Concern for RIGHT upper lobe  pneumonia. 2. Stable cavitary mass in the LEFT upper lobe. 3.  stable LEFT effusion. Electronically Signed   By: Jackquline Boxer M.D.   On: 02/22/2024 13:54        Scheduled Meds:  budesonide -glycopyrrolate -formoterol   2 puff Inhalation BID   dofetilide   500 mcg Oral BID   furosemide   40 mg Intravenous Once   gabapentin   300 mg Oral TID   metoprolol  tartrate  25 mg Oral BID   polyethylene glycol  17 g Oral Daily   potassium chloride   40 mEq Oral Q4H   pyridOXINE   100 mg Oral QPM   rivaroxaban   20 mg Oral Q supper   Continuous Infusions:  magnesium  sulfate bolus IVPB     pamidronate      piperacillin -tazobactam 3.375 g (02/22/24 1834)     LOS: 20 days    Time spent: over 30 min     Meliton Monte, MD [facility]   To contact the attending provider between 7A-7P or the covering provider during after hours 7P-7A, please log into the web site [email or web address] and access using universal [place] password for that web site. If you do not have the password, please call the hospital operator.  02/23/2024, 9:06 AM

## 2024-02-24 DIAGNOSIS — A419 Sepsis, unspecified organism: Secondary | ICD-10-CM | POA: Diagnosis not present

## 2024-02-24 DIAGNOSIS — I480 Paroxysmal atrial fibrillation: Secondary | ICD-10-CM | POA: Diagnosis not present

## 2024-02-24 DIAGNOSIS — R918 Other nonspecific abnormal finding of lung field: Secondary | ICD-10-CM | POA: Diagnosis not present

## 2024-02-24 LAB — CBC WITH DIFFERENTIAL/PLATELET
Abs Immature Granulocytes: 2.44 K/uL — ABNORMAL HIGH (ref 0.00–0.07)
Basophils Absolute: 0 K/uL (ref 0.0–0.1)
Basophils Relative: 0 %
Eosinophils Absolute: 0 K/uL (ref 0.0–0.5)
Eosinophils Relative: 0 %
HCT: 30.7 % — ABNORMAL LOW (ref 39.0–52.0)
Hemoglobin: 9.7 g/dL — ABNORMAL LOW (ref 13.0–17.0)
Immature Granulocytes: 4 %
Lymphocytes Relative: 2 %
Lymphs Abs: 1.6 K/uL (ref 0.7–4.0)
MCH: 26.1 pg (ref 26.0–34.0)
MCHC: 31.6 g/dL (ref 30.0–36.0)
MCV: 82.5 fL (ref 80.0–100.0)
Monocytes Absolute: 2.1 K/uL — ABNORMAL HIGH (ref 0.1–1.0)
Monocytes Relative: 3 %
Neutro Abs: 59.9 K/uL — ABNORMAL HIGH (ref 1.7–7.7)
Neutrophils Relative %: 91 %
Platelets: 484 K/uL — ABNORMAL HIGH (ref 150–400)
RBC: 3.72 MIL/uL — ABNORMAL LOW (ref 4.22–5.81)
RDW: 22.7 % — ABNORMAL HIGH (ref 11.5–15.5)
Smear Review: NORMAL
WBC: 65.9 K/uL (ref 4.0–10.5)
nRBC: 0 % (ref 0.0–0.2)

## 2024-02-24 LAB — COMPREHENSIVE METABOLIC PANEL WITH GFR
ALT: 25 U/L (ref 0–44)
AST: 35 U/L (ref 15–41)
Albumin: 2.1 g/dL — ABNORMAL LOW (ref 3.5–5.0)
Alkaline Phosphatase: 115 U/L (ref 38–126)
Anion gap: 9 (ref 5–15)
BUN: 20 mg/dL (ref 8–23)
CO2: 26 mmol/L (ref 22–32)
Calcium: 9.5 mg/dL (ref 8.9–10.3)
Chloride: 103 mmol/L (ref 98–111)
Creatinine, Ser: 0.86 mg/dL (ref 0.61–1.24)
GFR, Estimated: >60 mL/min (ref >60)
Glucose, Bld: 130 mg/dL — ABNORMAL HIGH (ref 70–99)
Potassium: 4.8 mmol/L (ref 3.5–5.1)
Sodium: 138 mmol/L (ref 135–145)
Total Bilirubin: 0.4 mg/dL (ref 0.0–1.2)
Total Protein: 5.8 g/dL — ABNORMAL LOW (ref 6.5–8.1)

## 2024-02-24 LAB — MAGNESIUM: Magnesium: 2.1 mg/dL (ref 1.7–2.4)

## 2024-02-24 LAB — BRAIN NATRIURETIC PEPTIDE: B Natriuretic Peptide: 638.4 pg/mL — ABNORMAL HIGH (ref 0.0–100.0)

## 2024-02-24 LAB — PHOSPHORUS: Phosphorus: 2.9 mg/dL (ref 2.5–4.6)

## 2024-02-24 MED ORDER — ACETAMINOPHEN 325 MG PO TABS: 650.0000 mg | ORAL_TABLET | Freq: Four times a day (QID) | ORAL | Status: DC | PRN

## 2024-02-24 MED ORDER — FUROSEMIDE 10 MG/ML IJ SOLN
20.0000 mg | Freq: Once | INTRAMUSCULAR | Status: AC
  Administered 2024-02-24: 20 mg via INTRAVENOUS
  Filled 2024-02-24: qty 2

## 2024-02-24 NOTE — Progress Notes (Signed)
 [phone]: Pt reports personal phone missing. Last seen by staff on pt's person, in bed, charging around 0300-0330. Phone was placed on charger, in bed with pt, bc he was confused. Around that time vitals were taken and pt's bed linens were changed. Pt said phone was almost fully charged and with ringer on. Despite multiple calls to his personal phone, pt's phone kept going to voicemail. New bed linens were searched while calling pt phone, RN unable to find phone in bed or in room/on charger. Laundry department called and given number to help search for missing smartphone--still went to voicemail. RN went down to laundry dept to look for pt's phone and sift through dirty linens to no avail. Pt updated, and told that, per [employer] if found, his phone will be returned.

## 2024-02-24 NOTE — TOC Progression Note (Addendum)
 Transition of Care [facility]) - Progression Note    Patient Details  Name: Bradley Ferguson MRN: [id number] Date of Birth: [birth year]  Transition of [facility] CM/SW Contact  [name], [place] Phone Number: 02/24/2024, 10:45 AM  Clinical Narrative:   CSW spoke with patient, who is oriented x3 at this time, about locating family to assist in decision making. Patient informed CSW that he has a daughter named Hubert Kerns that lives in [place], [place] (near [place]); patient stated Rachael's mothers name is Ronal Jenkins Kerns. CSW inquired if patient knows their phone numbers, patient stated no. Patient recently lost his phone. CSW inquired if patient has any income at this time. Patient stated he gets SSI, about $1,100 a month. CSW asked patients permission to reach out to financial counseling for LTC Medicaid screening, patient stated yes.   CSW spoke with [employer] leadership, Josefa Jes, about what further action to take. Per leadership: - CSW to ask patient permission to speak with friends and assist in locating family.  - Palliative following, waiting on hospice eligibility determination   CSW spoke with patient and asked permission to contact patients friends. Patient stated yes. CSW left VM for patients Friends listed in the chart to attempt locating family.   12:42 PM CSW [employer] with financial counseling to screen patient for LTC medicaid.   1:54 PM Marcey, patients friend, called CSW and stated he does not know any of patients family members. Marcey stated he only knows of patients other contact, Luke (friend). Marcey stated his girlfriend is also a mutual friend of the patient Denton Leech - #[phone]) and can be contacted.   2:37 PM Patient oriented to person, place, and time. CSW asked patients permission to speak with Clotilda, patient stated yes. CSW asked if patient knew his sons whereabouts as per Palliatives note, patient stated his son is in jail. Patient unaware of  where son is located. CSW asked patient who he would like to make decisions for him and patients stated his friend Luke.   CSW will continue to follow.    Expected Discharge Plan: Skilled Nursing Facility Barriers to Discharge: Continued Medical Work up, SNF Pending bed offer, Insurance Authorization               Expected Discharge Plan and Services In-house Referral: Clinical Social Work     Living arrangements for the past 2 months: Single Family Home                                       Social Drivers of Health (SDOH) Interventions SDOH Screenings   Food Insecurity: Food Insecurity Present (02/03/2024)  Housing: Low Risk  (02/03/2024)  Transportation Needs: No Transportation Needs (02/03/2024)  Utilities: Not At Risk (02/03/2024)  Alcohol Screen: Low Risk  (12/29/2023)  Depression (PHQ2-9): Medium Risk (11/13/2023)  Financial Resource Strain: Medium Risk (12/29/2023)  Physical Activity: Inactive (12/29/2023)  Social Connections: Socially Isolated (02/03/2024)  Stress: Stress Concern Present (12/29/2023)  Tobacco Use: High Risk (02/16/2024)  Health Literacy: Adequate Health Literacy (11/13/2023)    Readmission Risk Interventions    02/04/2024    3:23 PM 11/10/2023    2:54 PM  Readmission Risk Prevention Plan  Post Dischage Appt  Complete  Medication Screening  Complete  Transportation Screening Complete Complete  PCP or Specialist Appt within 5-7 Days Complete   Home Care Screening Complete  Medication Review (RN CM) Complete

## 2024-02-24 NOTE — Plan of Care (Signed)
  Problem: Clinical Measurements: Goal: Ability to maintain clinical measurements within normal limits will improve Outcome: Not Progressing   Problem: Clinical Measurements: Goal: Ability to maintain clinical measurements within normal limits will improve Outcome: Not Progressing   Problem: Activity: Goal: Risk for activity intolerance will decrease Outcome: Not Progressing   Problem: Coping: Goal: Level of anxiety will decrease Outcome: Not Progressing   Problem: Nutrition: Goal: Adequate nutrition will be maintained Outcome: Not Progressing

## 2024-02-24 NOTE — Significant Event (Signed)
 Pt experiencing acute confusion stating that someone wants him out and removed, 02 n/c and spilled water  in bed, 02 sats dropped to high 70s 80s with a congested cough gave Breathing treatment. Patient is more calm with productive bloody tinged sputum. 02 sats mid 80s on 6L n/c  Salter applied at 7L-8L sats at 92%

## 2024-02-24 NOTE — Progress Notes (Signed)
[facility]  PROGRESS NOTE  Bradley Ferguson FMW:[id number] DOB: 01-02-1951 DOA: 02/03/2024 PCP: Sebastian Beverley NOVAK, MD   Brief HPI:    73 y.o. male with medical history significant of patient with history of COPD, interstitial lung disease, patient has latent tuberculosis, atrial fibrillation on Tikosyn  and Xarelto , peripheral arterial disease, essential hypertension, anxiety disorder depression, history of hematuria with lung consolidation was on antibiotics by pulmonary.     Patient went to see pulmonary on the day of admission due to near syncope and productive cough.  He was found to have significant dyspnea and tachycardia with heart rate in the 120s.  Patient noted to have oxygen saturation 90% on room air.  He was however noted to have hypercalcemia, leukocytosis with left shift and lactic acidosis.     He was admitted for acute hypoxemic respiratory failure in the setting of left upper lobe consolidation and underwent bronchoscopy on 8/19.  Now noted to have worsening leukocytosis and remains on IV antibiotics with BAL showing 20,000 colonies of yeast. He continues to have some worsening leukocytosis that is suspected related to new cancer diagnosis with concern for NSCLC.     Oncology currently recommending hospice.  Palliative care has been consulted to help with goals of care conversations.     Assessment/Plan:   Goals of Care Oncology recommending hospice.   Palliative care consulted for goals of care   Delirium Delirium precautions, fentanyl  has been d/c'd by oncology Tapering steroids Will monitor and w/u as indicated   Acute hypoxic respiratory failure in the setting of left upper lobe consolidation  CT scan on 8/1 showed slightly increase of the left upper lobe dense consolidation, concerning for infection versus malignancy.  He has been having worsening shortness of breath, cough, hemoptysis on presentation.   Legionella negative, negative urine strep.  Histoplasma  antigen negative. Fungitell beta D glucan negative. Blastomyces antigen negative as well - currently on 4 L [place] - CXR 8/25 with findings concerning for RUL pneumonia - cavitary mass in LUL, L effusion - Given history of latent TB, he underwent AFB, now negative x 3 - s/p bronch on 8/19, negative acid fast smear, culture with candida albicans - fungal culture pending - per pulm 8/22, yeast in BAL likely colonization, no need for antifungal  - s/p zosyn  8/11-8/27. -Noted to have worsening leukocytosis likely in the setting of cancer/steroids.  No fever noted.  Continue CBC monitoring. -steroid taper, breztri , albuterol  prn in setting of COPD exacerbation. -will diurese as tolerated with elevated BNP   Stage IV Lung Cancer Surg path with non small cell carcinoma CT chest with mass like consolidation in left upper lobe, prominent mediastinal LN's.  CT abd with mediastinal LAD, increased in size.  Heterogenously enhancing nodule lateral to L psoas concerning for metastatic disease.   Seen by Dr. Timmy, right now planning for hospice it seems.  Planning to send tumor for molecular analysis.     Hypercalcemia of malignancy-over 15 on presentation.  PTH appropriately low, PTH RP less than 2.   Received fluids, Lasix  Improved, corrected calcium  is 10.8 - follow with lasix    Acute kidney injury-resolved, creatinine back to baseline   Pulmonary fibrosis, emphysema-contributing to his dyspnea   Persistent A-fib  RVR continue dofetilide .  Transition to xarelto  from heparin  today. Will titrate metoprolol  with continued RVR    Concern for severe sepsis sepsis physiology resolved.   Likely due to his possible upper lobe consolidation.  Noted to have persistent leukocytosis which  has worsened since admission, monitor.  Currently afebrile.    Hypokalemia Replace and follow, goal K>4 and mag>2 with tikosyn    Bipolar 1 disorder-continue home regimen   Abnormal Echo Needs repeat limited echo for  doppler assessment of AV to rule out AS      Medications     budesonide -glycopyrrolate -formoterol   2 puff Inhalation BID   dofetilide   500 mcg Oral BID   gabapentin   300 mg Oral TID   metoprolol  tartrate  37.5 mg Oral BID   polyethylene glycol  17 g Oral Daily   [START ON 02/25/2024] predniSONE   20 mg Oral Q breakfast   Followed by   NOREEN ON 02/19/2024] predniSONE   10 mg Oral Q breakfast   pyridOXINE   100 mg Oral QPM   rivaroxaban   20 mg Oral Q supper     Data Reviewed:   CBG:  No results for input(s): GLUCAP in the last 168 hours.  SpO2: 95 % O2 Flow Rate (L/min): 6 L/min    Vitals:   02/24/24 0010 02/24/24 0310 02/24/24 0318 02/24/24 0400  BP: 119/79 139/69    Pulse: 64 94    Resp: 18 (!) 24    Temp: 98.1 F (36.7 C) 97.8 F (36.6 C)    TempSrc: Oral Oral    SpO2: 97%  95%   Weight:    69 kg  Height:          Data Reviewed:  Basic Metabolic Panel: Recent Labs  Lab 02/20/24 0246 02/21/24 0300 02/22/24 0226 02/23/24 0343 02/24/24 0255  NA 134* 135 137 139 138  K 4.1 4.4 3.9 3.4* 4.8  CL 104 103 102 100 103  CO2 23 25 24 28 26   GLUCOSE 138* 102* 110* 128* 130*  BUN 13 13 17 17 20   CREATININE 0.82 0.73 0.82 0.79 0.86  CALCIUM  9.0 9.1 9.2 9.2 9.5  MG 1.9 1.9 1.8 1.8 2.1  PHOS  --  2.4* 1.9* 3.6 2.9    CBC: Recent Labs  Lab 02/19/24 0246 02/20/24 0246 02/21/24 0300 02/22/24 0226 02/23/24 0343 02/24/24 0255  WBC 42.8* 38.2* 39.5* 47.1* 44.1* 65.9*  NEUTROABS 37.9*  --  36.3* 42.9*  --  59.9*  HGB 9.3* 8.4* 8.1* 8.9* 8.7* 9.7*  HCT 29.3* 26.2* 26.0* 28.8* 27.5* 30.7*  MCV 80.7 80.6 82.3 82.1 81.6 82.5  PLT 440* 434* 395 475* 432* 484*    LFT Recent Labs  Lab 02/21/24 0300 02/22/24 0226 02/24/24 0255  AST 26 29 35  ALT 21 24 25   ALKPHOS 110 114 115  BILITOT 0.4 0.5 0.4  PROT 5.3* 6.1* 5.8*  ALBUMIN  1.7* 2.0* 2.1*     Antibiotics: Anti-infectives (From admission, onward)    Start     Dose/Rate Route Frequency Ordered Stop    02/08/24 1645  piperacillin -tazobactam (ZOSYN ) IVPB 3.375 g       Note to Pharmacy: Please change dosing if needed.   3.375 g 12.5 mL/hr over 240 Minutes Intravenous Every 8 hours 02/08/24 1549     02/05/24 1800  doxycycline  (VIBRA -TABS) tablet 100 mg        100 mg Oral Every 12 hours 02/05/24 1428 02/09/24 1721   02/04/24 1700  azithromycin  (ZITHROMAX ) 500 mg in sodium chloride  0.9 % 250 mL IVPB  Status:  Discontinued        500 mg 250 mL/hr over 60 Minutes Intravenous Every 24 hours 02/03/24 1945 02/03/24 2145   02/04/24 1600  cefTRIAXone  (ROCEPHIN ) 1 g in sodium chloride   0.9 % 100 mL IVPB  Status:  Discontinued        1 g 200 mL/hr over 30 Minutes Intravenous Every 24 hours 02/03/24 1945 02/08/24 1549   02/04/24 1600  doxycycline  (VIBRAMYCIN ) 100 mg in sodium chloride  0.9 % 250 mL IVPB  Status:  Discontinued        100 mg 125 mL/hr over 120 Minutes Intravenous Every 12 hours 02/03/24 2145 02/05/24 1428   02/03/24 1645  cefTRIAXone  (ROCEPHIN ) 2 g in sodium chloride  0.9 % 100 mL IVPB  Status:  Discontinued        2 g 200 mL/hr over 30 Minutes Intravenous Once 02/03/24 1643 02/03/24 1643   02/03/24 1645  azithromycin  (ZITHROMAX ) 500 mg in sodium chloride  0.9 % 250 mL IVPB  Status:  Discontinued        500 mg 250 mL/hr over 60 Minutes Intravenous  Once 02/03/24 1643 02/03/24 1643   02/03/24 1615  cefTRIAXone  (ROCEPHIN ) 1 g in sodium chloride  0.9 % 100 mL IVPB        1 g 200 mL/hr over 30 Minutes Intravenous  Once 02/03/24 1604 02/03/24 1656   02/03/24 1615  azithromycin  (ZITHROMAX ) 500 mg in sodium chloride  0.9 % 250 mL IVPB        500 mg 250 mL/hr over 60 Minutes Intravenous  Once 02/03/24 1604 02/03/24 1801        DVT prophylaxis: Xarelto   Code Status: DNR  Family Communication: No family at bedside   CONSULTS oncology, palliative care   Subjective   Continues to have cough.   Objective    Physical Examination:   Appears in mild respiratory distress S1-S2,  regular, no murmur auscultated Lungs bilateral rhonchi auscultated Abdomen is soft, nontender  Status is: Inpatient:             Sabas GORMAN Brod   [facility] If 7PM-7AM, please contact night-coverage at [email or web address], Office  [phone]   02/24/2024, 9:10 AM  LOS: 21 days

## 2024-02-24 NOTE — Plan of Care (Signed)
 Palliative:  Discussed with Dr. Drusilla. More confusion this afternoon. Discussed with CSW. Reviewed records. Not a candidate for hospice facility currently. He would be a candidate for hospice at home but he does not have the support and help at home. Disposition will be a challenge. I will follow up earlier in the day tomorrow with hopes he will be able to have a good conversation regarding his goals of care and wishes.   No charge  Bernarda Kitty, NP Palliative Medicine Team Pager [phone] (Please see [email or web address] for schedule) Team Phone [phone]

## 2024-02-24 NOTE — Progress Notes (Signed)
 Occupational Therapy Treatment Patient Details Name: COY ROCHFORD MRN: [id number] DOB: 06-08-1951 Today's Date: 02/24/2024   History of present illness 73 y.o. male admitted 02/03/24 for near syncope at pulmonologist appt. 8/1 CT with increase in size of dense masslike consolidation & pleural effusion. Work up revealed non-small cell carcinoma with limited treatment options. PMH: COPD, ILD, Afib, HTN, PAD s/p bil fem endarerectomy, HLD, raynaud's syndrome, latent tuberculosis, tobacco abuse   OT comments  Pt presented in bed and agreeable to try something but was focused about what to do on quality of life. He was able to sit at EOB with CGA but started to cough and requesting to go back into supine. Pt then asking therapist to stay with them and just talk about something. Will continue to follow and pt did not report they wanted to stop therapy at this time.      If plan is discharge home, recommend the following:  A little help with walking and/or transfers;A little help with bathing/dressing/bathroom;Direct supervision/assist for medications management;Assist for transportation   Equipment Recommendations  Other (comment) (TBD)    Recommendations for Other Services      Precautions / Restrictions Precautions Precautions: Fall;Other (comment) Recall of Precautions/Restrictions: Impaired Precaution/Restrictions Comments: watch sats, on 6L this session Restrictions Weight Bearing Restrictions Per Provider Order: No       Mobility Bed Mobility Overal bed mobility: Needs Assistance Bed Mobility: Supine to Sit, Sit to Supine     Supine to sit: Modified independent (Device/Increase time) Sit to supine: Modified independent (Device/Increase time)   General bed mobility comments: HOB elevated    Transfers                   General transfer comment: NT     Balance Overall balance assessment: Needs assistance Sitting-balance support: Feet supported Sitting  balance-Leahy Scale: Good Sitting balance - Comments: sittign at EOB and long sitting unsupported                                   ADL either performed or assessed with clinical judgement   ADL Overall ADL's : Needs assistance/impaired Eating/Feeding: Set up;Sitting                                          Extremity/Trunk Assessment Upper Extremity Assessment Upper Extremity Assessment: Generalized weakness            Vision   Vision Assessment?: Wears glasses for reading   Perception Perception Perception: Within Functional Limits   Praxis Praxis Praxis: WFL   Communication Communication Factors Affecting Communication: Difficulty expressing self   Cognition Arousal: Alert   Cognition: Cognition impaired     Awareness: Online awareness impaired   Attention impairment (select first level of impairment): Alternating attention Executive functioning impairment (select all impairments): Problem solving OT - Cognition Comments: Pt often closing eyes                 Following commands: Impaired Following commands impaired: Follows one step commands inconsistently, Follows one step commands with increased time      Cueing   Cueing Techniques: Verbal cues, Gestural cues, Tactile cues  Exercises      Shoulder Instructions       General Comments Pt on 6L throughout session 96-90% with coughing  Pertinent Vitals/ Pain       Pain Assessment Pain Assessment: 0-10 Pain Score: 7  Pain Location: back Pain Descriptors / Indicators: Discomfort, Grimacing  Home Living                                          Prior Functioning/Environment              Frequency  Min 2X/week        Progress Toward Goals  OT Goals(current goals can now be found in the care plan section)  Progress towards OT goals: Not progressing toward goals - comment  Acute Rehab OT Goals Patient Stated Goal: none OT Goal  Formulation: With patient Time For Goal Achievement: 03/09/24 Potential to Achieve Goals: Poor ADL Goals Pt Will Perform Grooming: with supervision;standing Pt Will Perform Lower Body Dressing: with supervision;sit to/from stand Pt Will Transfer to Toilet: with supervision;ambulating;regular height toilet Pt Will Perform Toileting - Clothing Manipulation and hygiene: with supervision;sit to/from stand  Plan      Co-evaluation                 AM-PAC OT 6 Clicks Daily Activity     Outcome Measure   Help from another person eating meals?: None Help from another person taking care of personal grooming?: A Little Help from another person toileting, which includes using toliet, bedpan, or urinal?: A Little Help from another person bathing (including washing, rinsing, drying)?: A Little Help from another person to put on and taking off regular upper body clothing?: A Little Help from another person to put on and taking off regular lower body clothing?: A Little 6 Click Score: 19    End of Session    OT Visit Diagnosis: Unsteadiness on feet (R26.81);Other abnormalities of gait and mobility (R26.89);Repeated falls (R29.6);Muscle weakness (generalized) (M62.81);History of falling (Z91.81)   Activity Tolerance Other (comment);No increased pain;Patient limited by fatigue (breathing)   Patient Left in bed;with call bell/phone within reach;with bed alarm set   Nurse Communication Mobility status (meds)        Time: 8844-8779 OT Time Calculation (min): 25 min  Charges: OT General Charges $OT Visit: 1 Visit OT Treatments $Self Care/Home Management : 23-37 mins  Warrick POUR OTR/L  Acute Rehab Services  [phone] office number   Warrick Berber 02/24/2024, 12:29 PM

## 2024-02-25 ENCOUNTER — Other Ambulatory Visit: Payer: Self-pay

## 2024-02-25 DIAGNOSIS — Z7189 Other specified counseling: Secondary | ICD-10-CM | POA: Diagnosis not present

## 2024-02-25 DIAGNOSIS — Z515 Encounter for palliative care: Secondary | ICD-10-CM | POA: Diagnosis not present

## 2024-02-25 DIAGNOSIS — I480 Paroxysmal atrial fibrillation: Secondary | ICD-10-CM | POA: Diagnosis not present

## 2024-02-25 DIAGNOSIS — R918 Other nonspecific abnormal finding of lung field: Secondary | ICD-10-CM | POA: Diagnosis not present

## 2024-02-25 DIAGNOSIS — A419 Sepsis, unspecified organism: Secondary | ICD-10-CM | POA: Diagnosis not present

## 2024-02-25 LAB — COMPREHENSIVE METABOLIC PANEL WITH GFR
ALT: 26 U/L (ref 0–44)
AST: 41 U/L (ref 15–41)
Albumin: 2.1 g/dL — ABNORMAL LOW (ref 3.5–5.0)
Alkaline Phosphatase: 111 U/L (ref 38–126)
Anion gap: 11 (ref 5–15)
BUN: 23 mg/dL (ref 8–23)
CO2: 29 mmol/L (ref 22–32)
Calcium: 9.5 mg/dL (ref 8.9–10.3)
Chloride: 100 mmol/L (ref 98–111)
Creatinine, Ser: 0.76 mg/dL (ref 0.61–1.24)
GFR, Estimated: >60 mL/min (ref >60)
Glucose, Bld: 109 mg/dL — ABNORMAL HIGH (ref 70–99)
Potassium: 5 mmol/L (ref 3.5–5.1)
Sodium: 140 mmol/L (ref 135–145)
Total Bilirubin: 0.6 mg/dL (ref 0.0–1.2)
Total Protein: 5.6 g/dL — ABNORMAL LOW (ref 6.5–8.1)

## 2024-02-25 LAB — CBC
HCT: 30.5 % — ABNORMAL LOW (ref 39.0–52.0)
Hemoglobin: 9.4 g/dL — ABNORMAL LOW (ref 13.0–17.0)
MCH: 25.9 pg — ABNORMAL LOW (ref 26.0–34.0)
MCHC: 30.8 g/dL (ref 30.0–36.0)
MCV: 84 fL (ref 80.0–100.0)
Platelets: 443 K/uL — ABNORMAL HIGH (ref 150–400)
RBC: 3.63 MIL/uL — ABNORMAL LOW (ref 4.22–5.81)
RDW: 22.8 % — ABNORMAL HIGH (ref 11.5–15.5)
WBC: 58 K/uL (ref 4.0–10.5)
nRBC: 0 % (ref 0.0–0.2)

## 2024-02-25 MED ORDER — SODIUM CHLORIDE 0.9 % IV SOLN: INTRAVENOUS | Status: AC

## 2024-02-25 MED ORDER — FUROSEMIDE 10 MG/ML IJ SOLN
20.0000 mg | Freq: Once | INTRAMUSCULAR | Status: AC
  Administered 2024-02-25: 20 mg via INTRAVENOUS
  Filled 2024-02-25: qty 2

## 2024-02-25 NOTE — TOC Progression Note (Addendum)
 Transition of Care [facility]) - Progression Note    Patient Details  Name: Bradley Ferguson MRN: [id number] Date of Birth: [birth year]  [facility]) CM/SW Contact  [name], [place] Phone Number: 02/25/2024, 8:37 AM  Clinical Narrative:  ICM leadership assisted CSW in searching for family contact. ICM leadership advised CSW to call Vernell Kerns - [phone]. CSW left VM for Vernell, without disclosing any patient information, at this time.   12:04 PM CSW received call back from Vernell Kerns from the following number: [phone]. Vernell confirmed she is patients biological daughter but has not seen patient since she was 73 years old. Vernell stated she has not heard from her father in years. CSW inquired if Vernell would be able to assist patient in decision making. Vernell stated she will call CSW back at a later time to discuss whether she wants to be involved. Vernell stated it may be best to have patients friends, who he's been in contact with, to assist in decision making. CSW updated Palliative.   CSW will continue to follow.    Expected Discharge Plan: Skilled Nursing Facility Barriers to Discharge: Continued Medical Work up, SNF Pending bed offer, Insurance Authorization               Expected Discharge Plan and Services In-house Referral: Clinical Social Work     Living arrangements for the past 2 months: Single Family Home                                       Social Drivers of Health (SDOH) Interventions SDOH Screenings   Food Insecurity: Food Insecurity Present (02/03/2024)  Housing: Low Risk  (02/03/2024)  Transportation Needs: No Transportation Needs (02/03/2024)  Utilities: Not At Risk (02/03/2024)  Alcohol Screen: Low Risk  (12/29/2023)  Depression (PHQ2-9): Medium Risk (11/13/2023)  Financial Resource Strain: Medium Risk (12/29/2023)  Physical Activity: Inactive (12/29/2023)  Social Connections: Socially Isolated (02/03/2024)  Stress: Stress Concern Present  (12/29/2023)  Tobacco Use: High Risk (02/16/2024)  Health Literacy: Adequate Health Literacy (11/13/2023)    Readmission Risk Interventions    02/04/2024    3:23 PM 11/10/2023    2:54 PM  Readmission Risk Prevention Plan  Post Dischage Appt  Complete  Medication Screening  Complete  Transportation Screening Complete Complete  PCP or Specialist Appt within 5-7 Days Complete   Home Care Screening Complete   Medication Review (RN CM) Complete

## 2024-02-25 NOTE — Progress Notes (Signed)
 Palliative:  HPI: 73 y.o. male   admitted on 02/03/2024 with  medical history significant of patient with history of COPD, interstitial lung disease, patient has latent tuberculosis, atrial fibrillation on Tikosyn  and Xarelto , peripheral arterial disease, essential hypertension, anxiety disorder depression, history of hematuria with lung consolidation. Patient went to see pulmonary on the day of admission due to near syncope and productive cough.  He was found to have significant dyspnea and tachycardia with heart rate in the 120s.  Patient noted to have oxygen saturation 90% on room air.  He was however noted to have hypercalcemia, leukocytosis with left shift and lactic acidosis. He was admitted for acute hypoxemic respiratory failure in the setting of left upper lobe consolidation and underwent bronchoscopy on 8/19.  Now noted to have worsening leukocytosis and remains on IV antibiotics  He continues to have some worsening leukocytosis that is suspected related to new cancer diagnosis with concern for NSCLC. Oncology currently recommending hospice. Pending decisions regarding treatment options, advanced directives and anticipatory care needs.   I met today with Bradley Ferguson. No family or visitors to bedside. He is awake and alert. At times during conversation he has good insight but difficult to maintain his focus. He overall has good recall of his situation. He is able to tell me that he has cancer and that the doctors tell him I am too far gone regarding treatment. We spent time reviewing the advanced nature of his cancer and poor prognosis. He understands. We discussed risk of further decline. At this time he agrees with no chemotherapy and aggressive/invasive treatment. He also tells me I'm not ready to give up. We discussed the difference of giving up vs continuing current conservative care while acknowledging our options are limited if he worsens - he is accepting of this. We discussed hospice and he  seems like he would be open to having hospice in the home but there does not seem to be a caregiver to support this disposition.   I discussed further with Bradley Ferguson who he would want to be his surrogate decision maker. He struggles and tells me - I don't have anybody's number. I shared that this is okay but even if he knows who he would trust to make these decisions we could assist to try and find contact information. He tells me he does not know who would be willing to act in this capacity on his behalf. He mentions many names but unable to name a specific person. I do clarify his wishes if he were in the condition where he could not make his own decisions if he would desire measures to prolong his life at that point and he tells me no - he would like to be comfortable and just let me go.   Updated by CSW - daughter located but she does not feel comfortable making decisions and would defer to Bradley Ferguson's friends.   Exam: Alert, mostly oriented although difficult to focus. Does seem to have good insight and recall today for decision making. Breathing is labored at rest intermittently. Poor appetite/intake. Abd soft. Warm to touch.   Plan: - DNR in place - Open to hospice at home but lack of caregiver to support this - Not yet eligible for hospice facility and patient not accepting to consider this at this time  - He would not want to be prolonged but wishes for comfort care if he cannot make his own decisions - He wishes to continue conservative/current medical care as  well as treat symptoms  65 min  Bernarda Kitty, NP Palliative Medicine Team Pager [phone] (Please see [email or web address] for schedule) Team Phone [phone]

## 2024-02-25 NOTE — Progress Notes (Signed)
[facility]  PROGRESS NOTE  Bradley Ferguson FMW:[id number] DOB: [birth year] DOA: 02/03/2024 PCP: Sebastian Beverley NOVAK, MD   Brief HPI:    73 y.o. male with medical history significant of patient with history of COPD, interstitial lung disease, patient has latent tuberculosis, atrial fibrillation on Tikosyn  and Xarelto , peripheral arterial disease, essential hypertension, anxiety disorder depression, history of hematuria with lung consolidation was on antibiotics by pulmonary.     Patient went to see pulmonary on the day of admission due to near syncope and productive cough.  He was found to have significant dyspnea and tachycardia with heart rate in the 120s.  Patient noted to have oxygen saturation 90% on room air.  He was however noted to have hypercalcemia, leukocytosis with left shift and lactic acidosis.     He was admitted for acute hypoxemic respiratory failure in the setting of left upper lobe consolidation and underwent bronchoscopy on 8/19.  Now noted to have worsening leukocytosis and remains on IV antibiotics with BAL showing 20,000 colonies of yeast. He continues to have some worsening leukocytosis that is suspected related to new cancer diagnosis with concern for NSCLC.     Oncology currently recommending hospice.  Palliative care has been consulted to help with goals of care conversations.     Assessment/Plan:   Goals of Care Oncology recommending hospice.   Palliative care consulted for goals of care   Delirium Delirium precautions, fentanyl  has been d/c'd by oncology Tapering steroids Will monitor and w/u as indicated   Acute hypoxic respiratory failure in the setting of left upper lobe consolidation  CT scan on 8/1 showed slightly increase of the left upper lobe dense consolidation, concerning for infection versus malignancy.  He has been having worsening shortness of breath, cough, hemoptysis on presentation.   Legionella negative, negative urine strep.  Histoplasma  antigen negative. Fungitell beta D glucan negative. Blastomyces antigen negative as well - currently on 4 L [place] - CXR 8/25 with findings concerning for RUL pneumonia - cavitary mass in LUL, L effusion - Given history of latent TB, he underwent AFB, now negative x 3 - s/p bronch on 8/19, negative acid fast smear, culture with candida albicans - fungal culture pending - per pulm 8/22, yeast in BAL likely colonization, no need for antifungal  - s/p zosyn  8/11-8/27. -Noted to have worsening leukocytosis likely in the setting of cancer/steroids.  No fever noted.  Continue CBC monitoring. -steroid taper, breztri , albuterol  prn in setting of COPD exacerbation. -will diurese as tolerated with elevated BNP   Stage IV Lung Cancer Surg path with non small cell carcinoma CT chest with mass like consolidation in left upper lobe, prominent mediastinal LN's.  CT abd with mediastinal LAD, increased in size.  Heterogenously enhancing nodule lateral to L psoas concerning for metastatic disease.   Seen by Dr. Timmy, right now planning for hospice it seems.  Planning to send tumor for molecular analysis.     Hypercalcemia of malignancy-over 15 on presentation.  PTH appropriately low, PTH RP less than 2.   Received fluids, Lasix  Improved, corrected serum calcium  11.0 -Will give gentle IV hydration with Lasix  -Follow serum calcium  level in a.m.   Acute kidney injury-resolved, creatinine back to baseline   Pulmonary fibrosis, emphysema-contributing to his dyspnea   Persistent A-fib  RVR continue dofetilide .  Transition to xarelto  from heparin  today. Will titrate metoprolol  with continued RVR    Concern for severe sepsis sepsis physiology resolved.   Likely due to his possible upper  lobe consolidation.  Noted to have persistent leukocytosis which has worsened since admission, monitor.  Currently afebrile.    Hypokalemia Replace and follow, goal K>4 and mag>2 with tikosyn    Bipolar 1 disorder-continue  home regimen   Abnormal Echo Needs repeat limited echo for doppler assessment of AV to rule out AS      Medications     budesonide -glycopyrrolate -formoterol   2 puff Inhalation BID   dofetilide   500 mcg Oral BID   gabapentin   300 mg Oral TID   metoprolol  tartrate  37.5 mg Oral BID   polyethylene glycol  17 g Oral Daily   predniSONE   20 mg Oral Q breakfast   Followed by   NOREEN ON 02/08/2024] predniSONE   10 mg Oral Q breakfast   pyridOXINE   100 mg Oral QPM   rivaroxaban   20 mg Oral Q supper     Data Reviewed:   CBG:  No results for input(s): GLUCAP in the last 168 hours.  SpO2: (!) 87 % O2 Flow Rate (L/min): 10 L/min    Vitals:   02/24/24 2208 02/25/24 0009 02/25/24 0421 02/25/24 0712  BP:   123/79 124/66  Pulse: (!) 126 (!) 103 (!) 105 88  Resp:  20 19 17   Temp:  97.8 F (36.6 C) 97.9 F (36.6 C) 97.9 F (36.6 C)  TempSrc:  Oral Oral Oral  SpO2:  98% 96% (!) 87%  Weight:   71.1 kg   Height:          Data Reviewed:  Basic Metabolic Panel: Recent Labs  Lab 02/20/24 0246 02/21/24 0300 02/22/24 0226 02/23/24 0343 02/24/24 0255 02/25/24 0252  NA 134* 135 137 139 138 140  K 4.1 4.4 3.9 3.4* 4.8 5.0  CL 104 103 102 100 103 100  CO2 23 25 24 28 26 29   GLUCOSE 138* 102* 110* 128* 130* 109*  BUN 13 13 17 17 20 23   CREATININE 0.82 0.73 0.82 0.79 0.86 0.76  CALCIUM  9.0 9.1 9.2 9.2 9.5 9.5  MG 1.9 1.9 1.8 1.8 2.1  --   PHOS  --  2.4* 1.9* 3.6 2.9  --     CBC: Recent Labs  Lab 02/19/24 0246 02/20/24 0246 02/21/24 0300 02/22/24 0226 02/23/24 0343 02/24/24 0255 02/25/24 0252  WBC 42.8*   < > 39.5* 47.1* 44.1* 65.9* 58.0*  NEUTROABS 37.9*  --  36.3* 42.9*  --  59.9*  --   HGB 9.3*   < > 8.1* 8.9* 8.7* 9.7* 9.4*  HCT 29.3*   < > 26.0* 28.8* 27.5* 30.7* 30.5*  MCV 80.7   < > 82.3 82.1 81.6 82.5 84.0  PLT 440*   < > 395 475* 432* 484* 443*   < > = values in this interval not displayed.    LFT Recent Labs  Lab 02/21/24 0300 02/22/24 0226  02/24/24 0255 02/25/24 0252  AST 26 29 35 41  ALT 21 24 25 26   ALKPHOS 110 114 115 111  BILITOT 0.4 0.5 0.4 0.6  PROT 5.3* 6.1* 5.8* 5.6*  ALBUMIN  1.7* 2.0* 2.1* 2.1*     Antibiotics: Anti-infectives (From admission, onward)    Start     Dose/Rate Route Frequency Ordered Stop   02/08/24 1645  piperacillin -tazobactam (ZOSYN ) IVPB 3.375 g  Status:  Discontinued       Note to Pharmacy: Please change dosing if needed.   3.375 g 12.5 mL/hr over 240 Minutes Intravenous Every 8 hours 02/08/24 1549 02/24/24 1145   02/05/24 1800  doxycycline  (VIBRA -TABS) tablet 100 mg        100 mg Oral Every 12 hours 02/05/24 1428 02/09/24 1721   02/04/24 1700  azithromycin  (ZITHROMAX ) 500 mg in sodium chloride  0.9 % 250 mL IVPB  Status:  Discontinued        500 mg 250 mL/hr over 60 Minutes Intravenous Every 24 hours 02/03/24 1945 02/03/24 2145   02/04/24 1600  cefTRIAXone  (ROCEPHIN ) 1 g in sodium chloride  0.9 % 100 mL IVPB  Status:  Discontinued        1 g 200 mL/hr over 30 Minutes Intravenous Every 24 hours 02/03/24 1945 02/08/24 1549   02/04/24 1600  doxycycline  (VIBRAMYCIN ) 100 mg in sodium chloride  0.9 % 250 mL IVPB  Status:  Discontinued        100 mg 125 mL/hr over 120 Minutes Intravenous Every 12 hours 02/03/24 2145 02/05/24 1428   02/03/24 1645  cefTRIAXone  (ROCEPHIN ) 2 g in sodium chloride  0.9 % 100 mL IVPB  Status:  Discontinued        2 g 200 mL/hr over 30 Minutes Intravenous Once 02/03/24 1643 02/03/24 1643   02/03/24 1645  azithromycin  (ZITHROMAX ) 500 mg in sodium chloride  0.9 % 250 mL IVPB  Status:  Discontinued        500 mg 250 mL/hr over 60 Minutes Intravenous  Once 02/03/24 1643 02/03/24 1643   02/03/24 1615  cefTRIAXone  (ROCEPHIN ) 1 g in sodium chloride  0.9 % 100 mL IVPB        1 g 200 mL/hr over 30 Minutes Intravenous  Once 02/03/24 1604 02/03/24 1656   02/03/24 1615  azithromycin  (ZITHROMAX ) 500 mg in sodium chloride  0.9 % 250 mL IVPB        500 mg 250 mL/hr over 60 Minutes  Intravenous  Once 02/03/24 1604 02/03/24 1801        DVT prophylaxis: Xarelto   Code Status: DNR  Family Communication: No family at bedside   CONSULTS oncology, palliative care   Subjective   Denies any complaints   Objective    Physical Examination:  Appears in no acute distress Alert, oriented x 3, intact insight and judgment, no focal deficit noted Lungs bilateral rhonchi auscultated Abdomen is soft, nontender, no organomegaly  Status is: Inpatient:             Sabas GORMAN Brod   [facility] If 7PM-7AM, please contact night-coverage at [email or web address], Office  [phone]   02/25/2024, 8:35 AM  LOS: 22 days

## 2024-02-25 NOTE — Progress Notes (Signed)
 The proposed treatment discussed in conference is for discussion purpose only and is not a binding recommendation.  The patients have not been physically examined, or presented with their treatment options.  Therefore, final treatment plans cannot be decided.

## 2024-02-26 LAB — COMPREHENSIVE METABOLIC PANEL WITH GFR
ALT: 25 U/L (ref 0–44)
AST: 36 U/L (ref 15–41)
Albumin: 2 g/dL — ABNORMAL LOW (ref 3.5–5.0)
Alkaline Phosphatase: 109 U/L (ref 38–126)
Anion gap: 11 (ref 5–15)
BUN: 21 mg/dL (ref 8–23)
CO2: 31 mmol/L (ref 22–32)
Calcium: 9.6 mg/dL (ref 8.9–10.3)
Chloride: 95 mmol/L — ABNORMAL LOW (ref 98–111)
Creatinine, Ser: 0.7 mg/dL (ref 0.61–1.24)
GFR, Estimated: >60 mL/min (ref >60)
Glucose, Bld: 123 mg/dL — ABNORMAL HIGH (ref 70–99)
Potassium: 3.9 mmol/L (ref 3.5–5.1)
Sodium: 137 mmol/L (ref 135–145)
Total Bilirubin: 0.5 mg/dL (ref 0.0–1.2)
Total Protein: 5.3 g/dL — ABNORMAL LOW (ref 6.5–8.1)

## 2024-02-26 LAB — MAGNESIUM: Magnesium: 1.9 mg/dL (ref 1.7–2.4)

## 2024-02-26 MED ORDER — POTASSIUM CHLORIDE CRYS ER 20 MEQ PO TBCR
20.0000 meq | EXTENDED_RELEASE_TABLET | Freq: Once | ORAL | Status: AC
  Administered 2024-02-26: 20 meq via ORAL
  Filled 2024-02-26: qty 1

## 2024-02-26 MED ORDER — MAGNESIUM SULFATE 2 GM/50ML IV SOLN
2.0000 g | Freq: Once | INTRAVENOUS | Status: AC
  Administered 2024-02-26: 2 g via INTRAVENOUS
  Filled 2024-02-26: qty 50

## 2024-02-26 NOTE — Plan of Care (Signed)
  Problem: Education: Goal: Knowledge of General Education information will improve Description: Including pain rating scale, medication(s)/side effects and non-pharmacologic comfort measures Outcome: Progressing   Problem: Pain Managment: Goal: General experience of comfort will improve and/or be controlled Outcome: Progressing

## 2024-02-26 NOTE — Progress Notes (Signed)
[facility]  PROGRESS NOTE  CHIN WACHTER FMW:[id number] DOB: [birth year] DOA: 02/03/2024 PCP: Sebastian Beverley NOVAK, MD   Brief HPI:    73 y.o. male with medical history significant of patient with history of COPD, interstitial lung disease, patient has latent tuberculosis, atrial fibrillation on Tikosyn  and Xarelto , peripheral arterial disease, essential hypertension, anxiety disorder depression, history of hematuria with lung consolidation was on antibiotics by pulmonary.     Patient went to see pulmonary on the day of admission due to near syncope and productive cough.  He was found to have significant dyspnea and tachycardia with heart rate in the 120s.  Patient noted to have oxygen saturation 90% on room air.  He was however noted to have hypercalcemia, leukocytosis with left shift and lactic acidosis.     He was admitted for acute hypoxemic respiratory failure in the setting of left upper lobe consolidation and underwent bronchoscopy on 8/19.  Now noted to have worsening leukocytosis and remains on IV antibiotics with BAL showing 20,000 colonies of yeast. He continues to have some worsening leukocytosis that is suspected related to new cancer diagnosis with concern for NSCLC.     Oncology currently recommending hospice.  Palliative care has been consulted to help with goals of care conversations.     Assessment/Plan:   Goals of Care Oncology recommending hospice.   Palliative care consulted for goals of care -Patient is DNR.  Willing to go with hospice however no support at home. -Will likely go to skilled nursing facility with palliative care follow-up   Delirium Delirium precautions, fentanyl  has been d/c'd by oncology Tapering steroids Will monitor and w/u as indicated   Acute hypoxic respiratory failure in the setting of left upper lobe consolidation  CT scan on 8/1 showed slightly increase of the left upper lobe dense consolidation, concerning for infection versus  malignancy.  He has been having worsening shortness of breath, cough, hemoptysis on presentation.   Legionella negative, negative urine strep.  Histoplasma antigen negative. Fungitell beta D glucan negative. Blastomyces antigen negative as well - currently on 4 L [place] - CXR 8/25 with findings concerning for RUL pneumonia - cavitary mass in LUL, L effusion - Given history of latent TB, he underwent AFB, now negative x 3 - s/p bronch on 8/19, negative acid fast smear, culture with candida albicans - fungal culture pending - per pulm 8/22, yeast in BAL likely colonization, no need for antifungal  - s/p zosyn  8/11-8/27. -Noted to have worsening leukocytosis likely in the setting of cancer/steroids.  No fever noted.  Continue CBC monitoring. -steroid taper, breztri , albuterol  prn in setting of COPD exacerbation. -will diurese as tolerated with elevated BNP   Stage IV Lung Cancer Surg path with non small cell carcinoma CT chest with mass like consolidation in left upper lobe, prominent mediastinal LN's.  CT abd with mediastinal LAD, increased in size.  Heterogenously enhancing nodule lateral to L psoas concerning for metastatic disease.   Seen by Dr. Timmy, right now planning for hospice it seems.  Planning to send tumor for molecular analysis.     Hypercalcemia of malignancy-over 15 on presentation.  PTH appropriately low, PTH RP less than 2.   Received fluids, Lasix  Improved, corrected serum calcium  11.0 - Started on gentle IV hydration with IV fluids and Lasix   -Follow serum calcium  level in a.m.   Acute kidney injury-resolved, creatinine back to baseline   Pulmonary fibrosis, emphysema-contributing to his dyspnea   Persistent A-fib  RVR continue dofetilide .  Transition to xarelto  from heparin  today. Will titrate metoprolol  with continued RVR    Concern for severe sepsis sepsis physiology resolved.   Likely due to his possible upper lobe consolidation.  Noted to have persistent  leukocytosis which has worsened since admission, monitor.  Currently afebrile.    Hypokalemia Replace and follow, goal K>4 and mag>2 with tikosyn    Bipolar 1 disorder-continue home regimen   Abnormal Echo Needs repeat limited echo for doppler assessment of AV to rule out AS      Medications     budesonide -glycopyrrolate -formoterol   2 puff Inhalation BID   dofetilide   500 mcg Oral BID   gabapentin   300 mg Oral TID   metoprolol  tartrate  37.5 mg Oral BID   polyethylene glycol  17 g Oral Daily   potassium chloride   20 mEq Oral Once   [START ON 01/30/2024] predniSONE   10 mg Oral Q breakfast   pyridOXINE   100 mg Oral QPM   rivaroxaban   20 mg Oral Q supper     Data Reviewed:   CBG:  No results for input(s): GLUCAP in the last 168 hours.  SpO2: 91 % O2 Flow Rate (L/min): 10 L/min    Vitals:   02/26/24 0733 02/26/24 0810 02/26/24 0826 02/26/24 1102  BP:    122/70  Pulse: (!) 111  90 95  Resp:    20  Temp:    98.3 F (36.8 C)  TempSrc:    Oral  SpO2:  95%  91%  Weight:      Height:          Data Reviewed:  Basic Metabolic Panel: Recent Labs  Lab 02/21/24 0300 02/22/24 0226 02/23/24 0343 02/24/24 0255 02/25/24 0252 02/26/24 0232  NA 135 137 139 138 140 137  K 4.4 3.9 3.4* 4.8 5.0 3.9  CL 103 102 100 103 100 95*  CO2 25 24 28 26 29 31   GLUCOSE 102* 110* 128* 130* 109* 123*  BUN 13 17 17 20 23 21   CREATININE 0.73 0.82 0.79 0.86 0.76 0.70  CALCIUM  9.1 9.2 9.2 9.5 9.5 9.6  MG 1.9 1.8 1.8 2.1  --  1.9  PHOS 2.4* 1.9* 3.6 2.9  --   --     CBC: Recent Labs  Lab 02/21/24 0300 02/22/24 0226 02/23/24 0343 02/24/24 0255 02/25/24 0252  WBC 39.5* 47.1* 44.1* 65.9* 58.0*  NEUTROABS 36.3* 42.9*  --  59.9*  --   HGB 8.1* 8.9* 8.7* 9.7* 9.4*  HCT 26.0* 28.8* 27.5* 30.7* 30.5*  MCV 82.3 82.1 81.6 82.5 84.0  PLT 395 475* 432* 484* 443*    LFT Recent Labs  Lab 02/21/24 0300 02/22/24 0226 02/24/24 0255 02/25/24 0252 02/26/24 0232  AST 26 29 35 41  36  ALT 21 24 25 26 25   ALKPHOS 110 114 115 111 109  BILITOT 0.4 0.5 0.4 0.6 0.5  PROT 5.3* 6.1* 5.8* 5.6* 5.3*  ALBUMIN  1.7* 2.0* 2.1* 2.1* 2.0*     Antibiotics: Anti-infectives (From admission, onward)    Start     Dose/Rate Route Frequency Ordered Stop   02/08/24 1645  piperacillin -tazobactam (ZOSYN ) IVPB 3.375 g  Status:  Discontinued       Note to Pharmacy: Please change dosing if needed.   3.375 g 12.5 mL/hr over 240 Minutes Intravenous Every 8 hours 02/08/24 1549 02/24/24 1145   02/05/24 1800  doxycycline  (VIBRA -TABS) tablet 100 mg        100 mg Oral Every 12 hours 02/05/24 1428 02/09/24  1721   02/04/24 1700  azithromycin  (ZITHROMAX ) 500 mg in sodium chloride  0.9 % 250 mL IVPB  Status:  Discontinued        500 mg 250 mL/hr over 60 Minutes Intravenous Every 24 hours 02/03/24 1945 02/03/24 2145   02/04/24 1600  cefTRIAXone  (ROCEPHIN ) 1 g in sodium chloride  0.9 % 100 mL IVPB  Status:  Discontinued        1 g 200 mL/hr over 30 Minutes Intravenous Every 24 hours 02/03/24 1945 02/08/24 1549   02/04/24 1600  doxycycline  (VIBRAMYCIN ) 100 mg in sodium chloride  0.9 % 250 mL IVPB  Status:  Discontinued        100 mg 125 mL/hr over 120 Minutes Intravenous Every 12 hours 02/03/24 2145 02/05/24 1428   02/03/24 1645  cefTRIAXone  (ROCEPHIN ) 2 g in sodium chloride  0.9 % 100 mL IVPB  Status:  Discontinued        2 g 200 mL/hr over 30 Minutes Intravenous Once 02/03/24 1643 02/03/24 1643   02/03/24 1645  azithromycin  (ZITHROMAX ) 500 mg in sodium chloride  0.9 % 250 mL IVPB  Status:  Discontinued        500 mg 250 mL/hr over 60 Minutes Intravenous  Once 02/03/24 1643 02/03/24 1643   02/03/24 1615  cefTRIAXone  (ROCEPHIN ) 1 g in sodium chloride  0.9 % 100 mL IVPB        1 g 200 mL/hr over 30 Minutes Intravenous  Once 02/03/24 1604 02/03/24 1656   02/03/24 1615  azithromycin  (ZITHROMAX ) 500 mg in sodium chloride  0.9 % 250 mL IVPB        500 mg 250 mL/hr over 60 Minutes Intravenous  Once 02/03/24  1604 02/03/24 1801        DVT prophylaxis: Xarelto   Code Status: DNR  Family Communication: No family at bedside   CONSULTS oncology, palliative care   Subjective   Continues to complain of cough.   Objective    Physical Examination:  Appears in no acute distress Lungs-bilateral rhonchi auscultated Abdomen is soft, nontender Extremities no edema  Status is: Inpatient:             Sabas GORMAN Brod   [facility] If 7PM-7AM, please contact night-coverage at [email or web address], Office  [phone]   02/26/2024, 3:48 PM  LOS: 23 days

## 2024-02-26 NOTE — Progress Notes (Signed)
 PT Cancellation Note  Patient Details Name: Bradley Ferguson MRN: [id number] DOB: 1950-09-11   Cancelled Treatment:    Reason Eval/Treat Not Completed: Patient declined, no reason specified (pt reports fatigue even sitting up in bed on 10L HFNC at 93%. Pt consistently unable to participate in therapy and agreeable to lack of tolerance at this time. Will sign off. Please reorder should pt status change)   Lillyauna Jenkinson B Dequon Schnebly 02/26/2024, 11:53 AM Lenoard SQUIBB, PT Acute Rehabilitation Services Office: [phone]

## 2024-02-26 NOTE — TOC Progression Note (Signed)
 Transition of Care [facility]) - Progression Note    Patient Details  Name: Bradley Ferguson MRN: [id number] Date of Birth: 1951-01-18  [facility] CM/SW Contact  [name], [place] Phone Number: 02/26/2024, 11:57 AM  Clinical Narrative:   Per PT note, patient on 10L HFNC. Patient not MR for discharge. Plans for disposition unclear.   Per Palliative note, patient open to hospice at home.   CSW will continue to follow.    Expected Discharge Plan: Skilled Nursing Facility Barriers to Discharge: Continued Medical Work up, SNF Pending bed offer, Insurance Authorization               Expected Discharge Plan and Services In-house Referral: Clinical Social Work     Living arrangements for the past 2 months: Single Family Home                                       Social Drivers of Health (SDOH) Interventions SDOH Screenings   Food Insecurity: Food Insecurity Present (02/03/2024)  Housing: Low Risk  (02/03/2024)  Transportation Needs: No Transportation Needs (02/03/2024)  Utilities: Not At Risk (02/03/2024)  Alcohol Screen: Low Risk  (12/29/2023)  Depression (PHQ2-9): Medium Risk (11/13/2023)  Financial Resource Strain: Medium Risk (12/29/2023)  Physical Activity: Inactive (12/29/2023)  Social Connections: Socially Isolated (02/03/2024)  Stress: Stress Concern Present (12/29/2023)  Tobacco Use: High Risk (02/16/2024)  Health Literacy: Adequate Health Literacy (11/13/2023)    Readmission Risk Interventions    02/04/2024    3:23 PM 11/10/2023    2:54 PM  Readmission Risk Prevention Plan  Post Dischage Appt  Complete  Medication Screening  Complete  Transportation Screening Complete Complete  PCP or Specialist Appt within 5-7 Days Complete   Home Care Screening Complete   Medication Review (RN CM) Complete

## 2024-02-27 DIAGNOSIS — A419 Sepsis, unspecified organism: Secondary | ICD-10-CM | POA: Diagnosis not present

## 2024-02-27 DIAGNOSIS — J189 Pneumonia, unspecified organism: Secondary | ICD-10-CM | POA: Diagnosis not present

## 2024-02-27 DIAGNOSIS — Z789 Other specified health status: Secondary | ICD-10-CM

## 2024-02-27 DIAGNOSIS — R918 Other nonspecific abnormal finding of lung field: Secondary | ICD-10-CM | POA: Diagnosis not present

## 2024-02-27 DIAGNOSIS — R0602 Shortness of breath: Secondary | ICD-10-CM

## 2024-02-27 DIAGNOSIS — Z7189 Other specified counseling: Secondary | ICD-10-CM

## 2024-02-27 DIAGNOSIS — Z711 Person with feared health complaint in whom no diagnosis is made: Secondary | ICD-10-CM

## 2024-02-27 DIAGNOSIS — R52 Pain, unspecified: Secondary | ICD-10-CM

## 2024-02-27 DIAGNOSIS — Z66 Do not resuscitate: Secondary | ICD-10-CM

## 2024-02-27 DIAGNOSIS — Z515 Encounter for palliative care: Secondary | ICD-10-CM

## 2024-02-27 MED ORDER — ENSURE PLUS HIGH PROTEIN PO LIQD
237.0000 mL | Freq: Two times a day (BID) | ORAL | Status: DC
  Administered 2024-02-28: 237 mL via ORAL

## 2024-02-27 NOTE — TOC Progression Note (Signed)
 Transition of Care [facility]) - Initial/Assessment Note    Patient Details  Name: Bradley Ferguson MRN: [id number] Date of Birth: [birth year]  Transition of Care [facility]) CM/SW Contact:    Britt JULIANNA Bennetts, LCSW Phone Number: 03-06-24, 2:05 PM  Clinical Narrative:                 CSW attempted to met with patient at bedside to present choice after MD and palliative NP recommendation for residential hospice.  Patient requested that CSW come back at later time.   TOC will continue to follow.    Expected Discharge Plan: Skilled Nursing Facility Barriers to Discharge: Continued Medical Work up, SNF Pending bed offer, Insurance Authorization   Patient Goals and CMS Choice Patient states their goals for this hospitalization and ongoing recovery are:: To go to rehab          Expected Discharge Plan and Services In-house Referral: Clinical Social Work     Living arrangements for the past 2 months: Single Family Home                                      Prior Living Arrangements/Services Living arrangements for the past 2 months: Single Family Home Lives with:: Self Patient language and need for interpreter reviewed:: Yes Do you feel safe going back to the place where you live?: Yes      Need for Family Participation in Patient Care: Yes (Comment) Care giver support system in place?: Yes (comment) (Patient stated he has someone that comes to check on him) Current home services: DME (rollator) Criminal Activity/Legal Involvement Pertinent to Current Situation/Hospitalization: No - Comment as needed  Activities of Daily Living   ADL Screening (condition at time of admission) Independently performs ADLs?: No Does the patient have a NEW difficulty with bathing/dressing/toileting/self-feeding that is expected to last >3 days?: Yes (Initiates electronic notice to provider for possible OT consult) Does the patient have a NEW difficulty with getting in/out of bed, walking, or climbing  stairs that is expected to last >3 days?: Yes (Initiates electronic notice to provider for possible PT consult) Does the patient have a NEW difficulty with communication that is expected to last >3 days?: No Is the patient deaf or have difficulty hearing?: No Does the patient have difficulty seeing, even when wearing glasses/contacts?: No Does the patient have difficulty concentrating, remembering, or making decisions?: No  Permission Sought/Granted Permission sought to share information with : Facility Industrial/product designer granted to share information with : Yes, Verbal Permission Granted     Permission granted to share info w AGENCY: SNFs        Emotional Assessment Appearance:: Appears stated age Attitude/Demeanor/Rapport: Engaged Affect (typically observed): Stable Orientation: : Oriented to Situation, Oriented to  Time, Oriented to Place, Oriented to Self Alcohol / Substance Use: Not Applicable Psych Involvement: No (comment)  Admission diagnosis:  Hypercalcemia [E83.52] Severe sepsis (HCC) [A41.9, R65.20] Mass of left lung [R91.8] Multifocal pneumonia [J18.9] Sepsis, due to unspecified organism, unspecified whether acute organ dysfunction present [facility]) [A41.9] Patient Active Problem List   Diagnosis Date Noted   Mass of upper lobe of left lung 02/16/2024   Mediastinal adenopathy 02/16/2024   Severe sepsis (HCC) 02/03/2024   Hypercalcemia 02/03/2024   Multifocal lung consolidation (HCC) 01/12/2024   Critical limb ischemia of right lower extremity (HCC) 11/09/2023   Atherosclerosis of lower extremity with claudication (HCC) 11/09/2023  Aortoiliac occlusive disease (HCC) 09/02/2023   Critical limb ischemia of left lower extremity (HCC) 07/28/2023   Elevated coronary artery calcium  score 07/13/2023   Hepatic steatosis 07/05/2020   Corn of foot 06/12/2020   Elevated LFTs 06/12/2020   Essential hypertension 04/29/2020   Right inguinal hernia 03/19/2020   TB  lung, latent 12/19/2019   Systemic scleroses (HCC) 12/19/2019   Centrilobular emphysema (HCC) 11/24/2019   Interstitial pulmonary disease (HCC) 11/24/2019   Positive ANA (antinuclear antibody) 11/24/2019   Atherosclerosis of aorta (HCC) by Chest CT scan on 01/19/2020 06/30/2019   Chronic obstructive pulmonary disease (HCC) 06/30/2019   Acquired thrombophilia (HCC) 05/10/2019   Colon polyps 12/06/2018   Visit for monitoring Tikosyn  therapy 04/27/2018   Peripheral arterial disease (HCC) 04/16/2017   Labile hypertension 02/29/2016   BPH 02/29/2016   Mixed hyperlipidemia 11/12/2015   Vitamin D  deficiency 11/12/2015   Bipolar I disorder (HCC) 08/13/2015   Atrial fibrillation (HCC) 08/13/2015   Attention deficit disorder 08/08/2010   Dental caries 08/08/2010   ANXIETY DEPRESSION 06/06/2010   TOBACCO ABUSE 06/06/2010   RAYNAUDS SYNDROME 06/06/2010   PCP:  Sebastian Beverley NOVAK, MD Pharmacy:   [facility] [place], [place] - [address] [place] [address] Phone: [phone] Fax: [phone]  [id number] - [place], [place] - [address] [place] [address] Phone: [phone] Fax: [phone]     Social Drivers of Health (SDOH) Social History: SDOH Screenings   Food Insecurity: Food Insecurity Present (02/03/2024)  Housing: Low Risk  (02/03/2024)  Transportation Needs: No Transportation Needs (02/03/2024)  Utilities: Not At Risk (02/03/2024)  Alcohol Screen: Low Risk  (12/29/2023)  Depression (PHQ2-9): Medium Risk (11/13/2023)  Financial Resource Strain: Medium Risk (12/29/2023)  Physical Activity: Inactive (12/29/2023)  Social Connections: Socially Isolated (02/03/2024)  Stress: Stress Concern Present (12/29/2023)  Tobacco Use: High Risk (02/16/2024)  Health Literacy: Adequate Health Literacy (11/13/2023)   SDOH Interventions: Food Insecurity Interventions: Inpatient TOC, Patient Declined Social  Connections Interventions: Inpatient TOC, Patient Declined   Readmission Risk Interventions    02/04/2024    3:23 PM 11/10/2023    2:54 PM  Readmission Risk Prevention Plan  Post Dischage Appt  Complete  Medication Screening  Complete  Transportation Screening Complete Complete  PCP or Specialist Appt within 5-7 Days Complete   Home Care Screening Complete   Medication Review (RN CM) Complete

## 2024-02-27 NOTE — Progress Notes (Signed)
 Daily Progress Note   Patient Name: Bradley Ferguson       Date: 03/21/2024 DOB: Mar 09, 1951  Age: 73 y.o. MRN#: [id number] Attending Physician: Drusilla Sabas RAMAN, MD Primary Care Physician: Sebastian Beverley NOVAK, MD Admit Date: 02/03/2024  Reason for Consultation/Follow-up: Establishing goals of care  Subjective: I have reviewed medical records including EPIC notes, MAR, any available advanced directives as necessary, and labs. Received report from primary RN - no acute concerns. Per RN, patient has minimal oral intake. Discussed patient's insurance with TOC in context of option for LTC with hospice.  Went to visit patient at bedside - no family/visitors present. Patient was lying in bed asleep - he does wake to voice/gentle touch. Patient is oriented x4. No signs or non-verbal gestures of pain or discomfort noted. No respiratory distress, increased work of breathing, or secretions noted. He is ill and frail appearing with congested, weak, non-productive cough. He tells me he doesn't feel well. He endorses generalized pain and shortness of breath. Patient states oxycodone  effectively relieves pain. Requested RN provide oxycodone  and albuterol  (then morphine  inhalant if not effective).   Emotional support provided to patient. He endorses interest in hospice services, but confirms he does not have 24/7 supervision/support at home. We discussed options of LTC with hospice vs inpatient hospice facility. Patient is open to either of these options - will see about getting inpatient hospice evaluation.  We talked about transition to comfort measures in house and what that would entail inclusive of medications to control pain, dyspnea, agitation, nausea, and itching. We discussed stopping all unnecessary measures such  as blood draws, needle sticks, oxygen, antibiotics, CBGs/insulin , cardiac monitoring, IVF, and frequent vital signs. Education provided that other non-pharmacological interventions would be utilized for holistic support and comfort such as spiritual support if requested, repositioning, music therapy, offering comfort feeds, and/or therapeutic listening. All care would focus on how the patient is looking and feeling. Patient will think about information from today prior to making final decisions. For now, continue gentle supportive treatment.  All questions and concerns addressed. Encouraged to call with questions and/or concerns. PMT card provided.  Length of Stay: 24  Current Medications: Scheduled Meds:   budesonide -glycopyrrolate -formoterol   2 puff Inhalation BID   dofetilide   500 mcg Oral BID   gabapentin   300 mg Oral TID  metoprolol  tartrate  37.5 mg Oral BID   polyethylene glycol  17 g Oral Daily   predniSONE   10 mg Oral Q breakfast   pyridOXINE   100 mg Oral QPM   rivaroxaban   20 mg Oral Q supper    Continuous Infusions:   PRN Meds: acetaminophen , albuterol , bisacodyl , bisacodyl , chlorpheniramine-HYDROcodone , ketoconazole , metoprolol  tartrate, morphine , mouth rinse, oxyCODONE , triamcinolone  cream  Physical Exam Vitals and nursing note reviewed.  Constitutional:      General: He is not in acute distress.    Appearance: He is cachectic. He is ill-appearing.  Pulmonary:     Effort: No respiratory distress.  Skin:    General: Skin is warm and dry.  Neurological:     Mental Status: He is alert and oriented to person, place, and time.     Motor: Weakness present.  Psychiatric:        Attention and Perception: Attention normal.        Behavior: Behavior is cooperative.        Cognition and Memory: Cognition and memory normal.             Vital Signs: BP (!) 140/76 (BP Location: Right Arm)   Pulse 91   Temp 98.9 F (37.2 C) (Oral)   Resp 20   Ht 6' 0.99 (1.854 m)   Wt  68.8 kg   SpO2 92%   BMI 20.02 kg/m  SpO2: SpO2: 92 % O2 Device: O2 Device: Nasal Cannula O2 Flow Rate: O2 Flow Rate (L/min): 10 L/min  Intake/output summary:  Intake/Output Summary (Last 24 hours) at 03-28-24 1137 Last data filed at 02/26/2024 1943 Gross per 24 hour  Intake 0 ml  Output 550 ml  Net -550 ml   LBM: Last BM Date : 02/25/24 Baseline Weight: Weight: 65.2 kg Most recent weight: Weight: 68.8 kg       Palliative Assessment/Data: PPS 20%      Patient Active Problem List   Diagnosis Date Noted   Mass of upper lobe of left lung 02/16/2024   Mediastinal adenopathy 02/16/2024   Severe sepsis (HCC) 02/03/2024   Hypercalcemia 02/03/2024   Multifocal lung consolidation (HCC) 01/12/2024   Critical limb ischemia of right lower extremity (HCC) 11/09/2023   Atherosclerosis of lower extremity with claudication (HCC) 11/09/2023   Aortoiliac occlusive disease (HCC) 09/02/2023   Critical limb ischemia of left lower extremity (HCC) 07/28/2023   Elevated coronary artery calcium  score 07/13/2023   Hepatic steatosis 07/05/2020   Corn of foot 06/12/2020   Elevated LFTs 06/12/2020   Essential hypertension 04/29/2020   Right inguinal hernia 03/19/2020   TB lung, latent 12/19/2019   Systemic scleroses (HCC) 12/19/2019   Centrilobular emphysema (HCC) 11/24/2019   Interstitial pulmonary disease (HCC) 11/24/2019   Positive ANA (antinuclear antibody) 11/24/2019   Atherosclerosis of aorta (HCC) by Chest CT scan on 01/19/2020 06/30/2019   Chronic obstructive pulmonary disease (HCC) 06/30/2019   Acquired thrombophilia (HCC) 05/10/2019   Colon polyps 12/06/2018   Visit for monitoring Tikosyn  therapy 04/27/2018   Peripheral arterial disease (HCC) 04/16/2017   Labile hypertension 02/29/2016   BPH 02/29/2016   Mixed hyperlipidemia 11/12/2015   Vitamin D  deficiency 11/12/2015   Bipolar I disorder (HCC) 08/13/2015   Atrial fibrillation (HCC) 08/13/2015   Attention deficit disorder  08/08/2010   Dental caries 08/08/2010   ANXIETY DEPRESSION 06/06/2010   TOBACCO ABUSE 06/06/2010   RAYNAUDS SYNDROME 06/06/2010    Palliative Care Assessment & Plan   Patient Profile: 73 y.o. male  admitted on 02/03/2024 with  medical history significant of patient with history of COPD, interstitial lung disease, patient has latent tuberculosis, atrial fibrillation on Tikosyn  and Xarelto , peripheral arterial disease, essential hypertension, anxiety disorder depression, history of hematuria with lung consolidation. Patient went to see pulmonary on the day of admission due to near syncope and productive cough.  He was found to have significant dyspnea and tachycardia with heart rate in the 120s.  Patient noted to have oxygen saturation 90% on room air.  He was however noted to have hypercalcemia, leukocytosis with left shift and lactic acidosis. He was admitted for acute hypoxemic respiratory failure in the setting of left upper lobe consolidation and underwent bronchoscopy on 8/19.  Now noted to have worsening leukocytosis and remains on IV antibiotics  He continues to have some worsening leukocytosis that is suspected related to new cancer diagnosis with concern for NSCLC. Oncology currently recommending hospice. Pending decisions regarding treatment options, advanced directives and anticipatory care needs.   Assessment: Principal Problem:   Mass of upper lobe of left lung Active Problems:   ANXIETY DEPRESSION   Bipolar I disorder (HCC)   Atrial fibrillation (HCC)   Peripheral arterial disease (HCC)   Chronic obstructive pulmonary disease (HCC)   Interstitial pulmonary disease (HCC)   TB lung, latent   Essential hypertension   Severe sepsis (HCC)   Hypercalcemia   Mediastinal adenopathy   Concern about end of life  Recommendations/Plan: Continue conservative/current medical care Continue DNR/DNI as previously documented - durable DNR form found in shadow chart. Copy was made and will be  scanned into Vynca/ACP tab Patient is now open to inpatient hospice facility or LTC with hospice - TOC notified and consult placed PMT will continue to follow and support holistically  Goals of Care and Additional Recommendations: Limitations on Scope of Treatment: No Tracheostomy  Code Status:    Code Status Orders  (From admission, onward)           Start     Ordered   02/19/24 1851  Do not attempt resuscitation (DNR)- Limited -Do Not Intubate (DNI)  (Code Status)  Continuous       Question Answer Comment  If pulseless and not breathing No CPR or chest compressions.   In Pre-Arrest Conditions (Patient Is Breathing and Has A Pulse) Do not intubate. Provide all appropriate non-invasive medical interventions. Avoid ICU transfer unless indicated or required.   Consent: Discussion documented in EHR or advanced directives reviewed      02/19/24 1851           Code Status History     Date Active Date Inactive Code Status Order ID Comments User Context   02/03/2024 1944 02/19/2024 1851 Full Code [id number]  Sim Emery CROME, MD Inpatient   11/09/2023 1309 11/11/2023 1829 Full Code [id number]  Charlyne Teretha RIGGERS Inpatient   09/02/2023 1629 09/04/2023 1614 Full Code [id number]  Bethanie Donnice RIGGERS Inpatient   07/09/2023 0952 07/09/2023 1920 Full Code [id number]  Court Dorn PARAS, MD Inpatient   04/27/2018 1640 04/30/2018 1754 Full Code [id number]  Leverne Charlies Helling, PA-C Inpatient   04/27/2017 9061 04/28/2017 1459 Full Code [id number]  Court Dorn PARAS, MD Inpatient       Prognosis:  < 2 weeks would not be surprising  Discharge Planning: To Be Determined  Care plan was discussed with primary RN, TOC, Dr. Drusilla  Thank you for allowing the Palliative Medicine Team to assist in the care of this patient.   Total Time  50 minutes Prolonged Time Billed  no       Jeoffrey CHRISTELLA Sharps, NP  Please contact Palliative Medicine Team phone at [phone] for questions and concerns.   *Portions  of this note are a verbal dictation therefore any spelling and/or grammatical errors are due to the Dragon [employer] One system interpretation.

## 2024-02-27 NOTE — Progress Notes (Signed)
 [facility]  PROGRESS NOTE  Bradley Ferguson FMW:[id number] DOB: [birth year] DOA: 02/03/2024 PCP: Sebastian Beverley NOVAK, MD   Brief HPI:    73 y.o. male with medical history significant of patient with history of COPD, interstitial lung disease, patient has latent tuberculosis, atrial fibrillation on Tikosyn  and Xarelto , peripheral arterial disease, essential hypertension, anxiety disorder depression, history of hematuria with lung consolidation was on antibiotics by pulmonary.     Patient went to see pulmonary on the day of admission due to near syncope and productive cough.  He was found to have significant dyspnea and tachycardia with heart rate in the 120s.  Patient noted to have oxygen saturation 90% on room air.  He was however noted to have hypercalcemia, leukocytosis with left shift and lactic acidosis.     He was admitted for acute hypoxemic respiratory failure in the setting of left upper lobe consolidation and underwent bronchoscopy on 8/19.  Now noted to have worsening leukocytosis and remains on IV antibiotics with BAL showing 20,000 colonies of yeast. He continues to have some worsening leukocytosis that is suspected related to new cancer diagnosis with concern for NSCLC.     Oncology currently recommending hospice.  Palliative care has been consulted to help with goals of care conversations.     Assessment/Plan:   Goals of Care Oncology recommending hospice.   Palliative care consulted for goals of care -Patient is DNR.  Willing to go with hospice however no support at home. -Will likely go to skilled nursing facility with palliative care follow-up   Delirium Resolved- Delirium precautions, fentanyl  has been d/c'd by oncology Tapering steroids Will monitor and w/u as indicated   Acute hypoxic respiratory failure in the setting of left upper lobe consolidation  CT scan on 8/1 showed slightly increase of the left upper lobe dense consolidation, concerning for infection versus  malignancy.  He has been having worsening shortness of breath, cough, hemoptysis on presentation.   Legionella negative, negative urine strep.  Histoplasma antigen negative. Fungitell beta D glucan negative. Blastomyces antigen negative as well - currently on 4 L [place] - CXR 8/25 with findings concerning for RUL pneumonia - cavitary mass in LUL, L effusion - Given history of latent TB, he underwent AFB, now negative x 3 - s/p bronch on 8/19, negative acid fast smear, culture with candida albicans - fungal culture pending - per pulm 8/22, yeast in BAL likely colonization, no need for antifungal  - s/p zosyn  8/11-8/27. -Noted to have worsening leukocytosis likely in the setting of cancer/steroids.  No fever noted.  Continue CBC monitoring. -steroid taper, breztri , albuterol  prn in setting of COPD exacerbation. -will continue to diurese as tolerated with elevated BNP   Stage IV Lung Cancer Surg path with non small cell carcinoma CT chest with mass like consolidation in left upper lobe, prominent mediastinal LN's.  CT abd with mediastinal LAD, increased in size.  Heterogenously enhancing nodule lateral to L psoas concerning for metastatic disease.   Seen by Dr. Timmy, right now planning for hospice it seems.  Planning to send tumor for molecular analysis.     Hypercalcemia of malignancy-over 15 on presentation.  PTH appropriately low, PTH RP less than 2.   Received fluids, Lasix  Improved, corrected serum calcium  11.0 - Started on gentle IV hydration with IV fluids and Lasix   -Follow serum calcium  level in a.m.   Acute kidney injury-resolved, creatinine back to baseline   Pulmonary fibrosis, emphysema-contributing to his dyspnea   Persistent A-fib  RVR  continue dofetilide .  Transition to xarelto  from heparin  today. Continue metoprolol    Concern for severe sepsis sepsis physiology resolved.   Likely due to his possible upper lobe consolidation.  Noted to have persistent leukocytosis which  has worsened since admission, monitor.  Currently afebrile.  -Antibiotics discontinued   Hypokalemia Replace and follow, goal K>4 and mag>2 with tikosyn    Bipolar 1 disorder-continue home regimen        Medications     budesonide -glycopyrrolate -formoterol   2 puff Inhalation BID   dofetilide   500 mcg Oral BID   gabapentin   300 mg Oral TID   metoprolol  tartrate  37.5 mg Oral BID   polyethylene glycol  17 g Oral Daily   predniSONE   10 mg Oral Q breakfast   pyridOXINE   100 mg Oral QPM   rivaroxaban   20 mg Oral Q supper     Data Reviewed:   CBG:  No results for input(s): GLUCAP in the last 168 hours.  SpO2: 92 % O2 Flow Rate (L/min): 10 L/min FiO2 (%): 53 %    Vitals:   02/26/24 1940 02/26/24 2045 02/26/24 2337 03-12-24 0540  BP: (!) 135/99 (!) 135/99 123/87 (!) 140/76  Pulse: 85 (!) 101 98 91  Resp: 20  18 20   Temp: 97.9 F (36.6 C)  98.9 F (37.2 C) 98.9 F (37.2 C)  TempSrc: Oral  Oral Oral  SpO2: 92%  92% 92%  Weight:    68.8 kg  Height:          Data Reviewed:  Basic Metabolic Panel: Recent Labs  Lab 02/21/24 0300 02/22/24 0226 02/23/24 0343 02/24/24 0255 02/25/24 0252 02/26/24 0232  NA 135 137 139 138 140 137  K 4.4 3.9 3.4* 4.8 5.0 3.9  CL 103 102 100 103 100 95*  CO2 25 24 28 26 29 31   GLUCOSE 102* 110* 128* 130* 109* 123*  BUN 13 17 17 20 23 21   CREATININE 0.73 0.82 0.79 0.86 0.76 0.70  CALCIUM  9.1 9.2 9.2 9.5 9.5 9.6  MG 1.9 1.8 1.8 2.1  --  1.9  PHOS 2.4* 1.9* 3.6 2.9  --   --     CBC: Recent Labs  Lab 02/21/24 0300 02/22/24 0226 02/23/24 0343 02/24/24 0255 02/25/24 0252  WBC 39.5* 47.1* 44.1* 65.9* 58.0*  NEUTROABS 36.3* 42.9*  --  59.9*  --   HGB 8.1* 8.9* 8.7* 9.7* 9.4*  HCT 26.0* 28.8* 27.5* 30.7* 30.5*  MCV 82.3 82.1 81.6 82.5 84.0  PLT 395 475* 432* 484* 443*    LFT Recent Labs  Lab 02/21/24 0300 02/22/24 0226 02/24/24 0255 02/25/24 0252 02/26/24 0232  AST 26 29 35 41 36  ALT 21 24 25 26 25   ALKPHOS  110 114 115 111 109  BILITOT 0.4 0.5 0.4 0.6 0.5  PROT 5.3* 6.1* 5.8* 5.6* 5.3*  ALBUMIN  1.7* 2.0* 2.1* 2.1* 2.0*     Antibiotics: Anti-infectives (From admission, onward)    Start     Dose/Rate Route Frequency Ordered Stop   02/08/24 1645  piperacillin -tazobactam (ZOSYN ) IVPB 3.375 g  Status:  Discontinued       Note to Pharmacy: Please change dosing if needed.   3.375 g 12.5 mL/hr over 240 Minutes Intravenous Every 8 hours 02/08/24 1549 02/24/24 1145   02/05/24 1800  doxycycline  (VIBRA -TABS) tablet 100 mg        100 mg Oral Every 12 hours 02/05/24 1428 02/09/24 1721   02/04/24 1700  azithromycin  (ZITHROMAX ) 500 mg in sodium chloride   0.9 % 250 mL IVPB  Status:  Discontinued        500 mg 250 mL/hr over 60 Minutes Intravenous Every 24 hours 02/03/24 1945 02/03/24 2145   02/04/24 1600  cefTRIAXone  (ROCEPHIN ) 1 g in sodium chloride  0.9 % 100 mL IVPB  Status:  Discontinued        1 g 200 mL/hr over 30 Minutes Intravenous Every 24 hours 02/03/24 1945 02/08/24 1549   02/04/24 1600  doxycycline  (VIBRAMYCIN ) 100 mg in sodium chloride  0.9 % 250 mL IVPB  Status:  Discontinued        100 mg 125 mL/hr over 120 Minutes Intravenous Every 12 hours 02/03/24 2145 02/05/24 1428   02/03/24 1645  cefTRIAXone  (ROCEPHIN ) 2 g in sodium chloride  0.9 % 100 mL IVPB  Status:  Discontinued        2 g 200 mL/hr over 30 Minutes Intravenous Once 02/03/24 1643 02/03/24 1643   02/03/24 1645  azithromycin  (ZITHROMAX ) 500 mg in sodium chloride  0.9 % 250 mL IVPB  Status:  Discontinued        500 mg 250 mL/hr over 60 Minutes Intravenous  Once 02/03/24 1643 02/03/24 1643   02/03/24 1615  cefTRIAXone  (ROCEPHIN ) 1 g in sodium chloride  0.9 % 100 mL IVPB        1 g 200 mL/hr over 30 Minutes Intravenous  Once 02/03/24 1604 02/03/24 1656   02/03/24 1615  azithromycin  (ZITHROMAX ) 500 mg in sodium chloride  0.9 % 250 mL IVPB        500 mg 250 mL/hr over 60 Minutes Intravenous  Once 02/03/24 1604 02/03/24 1801        DVT  prophylaxis: Xarelto   Code Status: DNR  Family Communication: No family at bedside   CONSULTS oncology, palliative care   Subjective   Denies any complaints.   Objective    Physical Examination:  Appears in no acute distress S1-S2, regular Lungs-scattered rhonchi bilaterally Extremities no edema  Status is: Inpatient:         Sabas GORMAN Brod   [facility] If 7PM-7AM, please contact night-coverage at [email or web address], Office  [phone]   03-13-2024, 8:19 AM  LOS: 24 days

## 2024-02-28 MED ORDER — LORAZEPAM 1 MG PO TABS
1.0000 mg | ORAL_TABLET | ORAL | Status: DC | PRN
  Administered 2024-02-28: 1 mg via ORAL
  Filled 2024-02-28: qty 1

## 2024-02-28 MED ORDER — LEVALBUTEROL HCL 0.63 MG/3ML IN NEBU
0.6300 mg | INHALATION_SOLUTION | Freq: Four times a day (QID) | RESPIRATORY_TRACT | Status: DC | PRN
  Filled 2024-02-28: qty 3

## 2024-02-28 MED ORDER — OXYCODONE HCL 5 MG PO TABS
10.0000 mg | ORAL_TABLET | ORAL | Status: DC | PRN
  Administered 2024-02-28: 10 mg via ORAL
  Filled 2024-02-28: qty 2

## 2024-02-29 NOTE — Plan of Care (Signed)
  Problem: Clinical Measurements: Goal: Ability to maintain clinical measurements within normal limits will improve Outcome: Progressing Goal: Respiratory complications will improve Outcome: Progressing   Problem: Nutrition: Goal: Adequate nutrition will be maintained Outcome: Progressing   Problem: Pain Managment: Goal: General experience of comfort will improve and/or be controlled Outcome: Progressing

## 2024-02-29 NOTE — Progress Notes (Signed)
 Telemetry called Pt's HR rate was in the 20's . Pt was unresponsive and pulseless when I arrived . Notified the charge nurse and called rapid response. Pt is a DNR-Limited. Pt was pronounced dead by two RN's. Devere Engineer, manufacturing systems) and [place] at 1700.  This RN notified  Listed contacts in the System. Pt has no family member listed. This RN was unable to reach Daughter. This RN notified on-call MD, [place].   Post-Mortem checklist done,[place] notified. Report given to the night RN.

## 2024-02-29 NOTE — Plan of Care (Signed)
  Problem: Pain Managment: Goal: General experience of comfort will improve and/or be controlled Outcome: Progressing   Problem: Safety: Goal: Ability to remain free from injury will improve Outcome: Progressing

## 2024-02-29 NOTE — Progress Notes (Signed)
Daily Progress Note   Patient Name: Bradley Ferguson       Date: 01/29/2024 DOB: [birth year]  Age: 73 y.o. MRN#: [id number] Attending Physician: Drusilla Sabas RAMAN, MD Primary Care Physician: Bradley Beverley NOVAK, MD Admit Date: 02/03/2024  Reason for Consultation/Follow-up: Establishing goals of care  Subjective: I have reviewed medical records including EPIC notes, MAR, any available advanced directives as necessary, and labs.  Patient is coughing with congestion, breathing labored, very weak.  No visitors were present during my evaluation.  Created space and opportunity for patient and family's thoughts and feelings on patient's current illness.  He knowledges that he seems worse today.  I reviewed options including comfort focused care and hospice.  He acknowledges goals of care conversation held with my colleague yesterday, though he has not made any decisions yet.  He feels he is currently in too much pain to have a good conversation about his decisions.  He was hoping for a few more days to consider his options before making a decision.  I shared my concern that he is already worse today compared to yesterday and a few more days might be too difficult for him to tolerate if he remains his current state or worse.  He is agreeable to another visit tomorrow for continued support and discussions as he is able. He also feels he needs at least 2 or 3 oxycodone  pills for adequate relief of his symptoms.  Discussed with RN.  All questions and concerns addressed. Encouraged to call with questions and/or concerns. PMT card provided.  Length of Stay: 25  Current Medications:  Physical Exam Vitals and nursing note reviewed.  Constitutional:      General: He is not in acute distress.    Appearance: He is  cachectic. He is ill-appearing.  HENT:     Head: Normocephalic and atraumatic.  Cardiovascular:     Rate and Rhythm: Tachycardia present.  Pulmonary:     Effort: Tachypnea present.  Skin:    General: Skin is warm and dry.  Neurological:     Mental Status: He is alert and oriented to person, place, and time.     Motor: Weakness present.  Psychiatric:        Attention and Perception: Attention normal.        Behavior: Behavior is cooperative.        Cognition and Memory: Cognition and memory normal.             Vital Signs: BP 131/82 (BP Location: Right Arm)   Pulse (!) 107   Temp 98 F (36.7 C) (Oral)   Resp 17   Ht 6' 0.99 (1.854 m)   Wt 70.2 kg   SpO2 97%   BMI 20.42 kg/m  SpO2: SpO2: 97 % O2 Device: O2 Device: Nasal Cannula O2 Flow Rate: O2 Flow Rate (L/min): 6 L/min      Palliative Assessment/Data: PPS 20%    Palliative Care Assessment & Plan   Patient Profile: 73 y.o. male   admitted on 02/03/2024 with  medical history significant of patient with history of COPD, interstitial lung disease, patient has latent tuberculosis, atrial fibrillation on Tikosyn  and Xarelto , peripheral arterial disease, essential hypertension, anxiety  disorder depression, history of hematuria with lung consolidation. Patient went to see pulmonary on the day of admission due to near syncope and productive cough.  He was found to have significant dyspnea and tachycardia with heart rate in the 120s.  Patient noted to have oxygen saturation 90% on room air.  He was however noted to have hypercalcemia, leukocytosis with left shift and lactic acidosis. He was admitted for acute hypoxemic respiratory failure in the setting of left upper lobe consolidation and underwent bronchoscopy on 8/19.  Now noted to have worsening leukocytosis and remains on IV antibiotics  He continues to have some worsening leukocytosis that is suspected related to new cancer diagnosis with concern for NSCLC. Oncology currently  recommending hospice. Pending decisions regarding treatment options, advanced directives and anticipatory care needs.   Assessment: Principal Problem:   Mass of upper lobe of left lung Active Problems:   ANXIETY DEPRESSION   Bipolar I disorder (HCC)   Atrial fibrillation (HCC)   Peripheral arterial disease (HCC)   Chronic obstructive pulmonary disease (HCC)   Interstitial pulmonary disease (HCC)   TB lung, latent   Essential hypertension   Severe sepsis (HCC)   Hypercalcemia   Mediastinal adenopathy Goals of care conversation  Recommendations/Plan: Continue DNR/DNI  Continue current care plan for now Patient has not made decision on comfort care or hospice facility yet.  He would like more time to consider Ongoing goals of care discussions Oxycodone  order changed to 10 mg every 4 hours as needed Psychosocial and emotional support provided PMT will continue to follow and support holistically  Prognosis:  < 2 weeks would not be surprising  Discharge Planning: To Be Determined  Care plan was discussed with patient, primary RN, Dr. Drusilla Mickle Fell, PA-C Palliative Medicine Team Team phone # [phone]  Thank you for allowing the Palliative Medicine Team to assist in the care of this patient. Please utilize secure chat with additional questions, if there is no response within 30 minutes please call the above phone number.  Palliative Medicine Team providers are available by phone from 7am to 7pm daily and can be reached through the team cell phone.  Should this patient require assistance outside of these hours, please call the patient's attending physician.  Portions of this note are a verbal dictation therefore any spelling and/or grammatical errors are due to the Dragon [employer] One system interpretation.    Time Total: 35  Visit consisted of counseling and education dealing with the complex and emotionally intense issues of symptom management and  palliative care in the setting of serious and potentially life-threatening illness. Greater than 50% of this time was spent counseling and coordinating care related to the above assessment and plan.  Personally spent 35 minutes in patient care including extensive chart review (labs, imaging, progress/consult notes, vital signs), medically appropraite exam, discussed with treatment team, education to patient, family, and staff, documenting clinical information, medication review and management, coordination of care, and available advanced directive documents.

## 2024-02-29 NOTE — Progress Notes (Signed)
[facility]  PROGRESS NOTE  Bradley Ferguson FMW:[id number] DOB: [birth year] DOA: 02/03/2024 PCP: Bradley Beverley NOVAK, MD   Brief HPI:    73 y.o. male with medical history significant of patient with history of COPD, interstitial lung disease, patient has latent tuberculosis, atrial fibrillation on Tikosyn  and Xarelto , peripheral arterial disease, essential hypertension, anxiety disorder depression, history of hematuria with lung consolidation was on antibiotics by pulmonary.     Patient went to see pulmonary on the day of admission due to near syncope and productive cough.  He was found to have significant dyspnea and tachycardia with heart rate in the 120s.  Patient noted to have oxygen saturation 90% on room air.  He was however noted to have hypercalcemia, leukocytosis with left shift and lactic acidosis.     He was admitted for acute hypoxemic respiratory failure in the setting of left upper lobe consolidation and underwent bronchoscopy on 8/19.  Now noted to have worsening leukocytosis and remains on IV antibiotics with BAL showing 20,000 colonies of yeast. He continues to have some worsening leukocytosis that is suspected related to new cancer diagnosis with concern for NSCLC.     Oncology currently recommending hospice.  Palliative care has been consulted to help with goals of care conversations.     Assessment/Plan:   Goals of Care Oncology recommending hospice.   Palliative care consulted for goals of care -Patient is DNR.  Willing to go with hospice however no support at home. -Will likely go to skilled nursing facility with palliative care follow-up   Delirium Resolved- Delirium precautions, fentanyl  has been d/c'd by oncology Tapering steroids Will monitor and w/u as indicated   Acute hypoxic respiratory failure in the setting of left upper lobe consolidation  CT scan on 8/1 showed slightly increase of the left upper lobe dense consolidation, concerning for infection versus  malignancy.  He has been having worsening shortness of breath, cough, hemoptysis on presentation.   Legionella negative, negative urine strep.  Histoplasma antigen negative. Fungitell beta D glucan negative. Blastomyces antigen negative as well - currently on 4 L [place] - CXR 8/25 with findings concerning for RUL pneumonia - cavitary mass in LUL, L effusion - Given history of latent TB, he underwent AFB, now negative x 3 - s/p bronch on 8/19, negative acid fast smear, culture with candida albicans - fungal culture pending - per pulm 8/22, yeast in BAL likely colonization, no need for antifungal  - s/p zosyn  8/11-8/27. -Noted to have worsening leukocytosis likely in the setting of cancer/steroids.  No fever noted.  Continue CBC monitoring. -steroid taper, breztri , albuterol  prn in setting of COPD exacerbation. -will continue to diurese as tolerated with elevated BNP -Patient is now getting worse and may need to go to residential hospice versus LTAC with hospice, palliative care is following   Stage IV Lung Cancer Surg path with non small cell carcinoma CT chest with mass like consolidation in left upper lobe, prominent mediastinal LN's.  CT abd with mediastinal LAD, increased in size.  Heterogenously enhancing nodule lateral to L psoas concerning for metastatic disease.   Seen by Bradley Ferguson, right now planning for hospice it seems.   Palliative care following, will likely need residential hospice versus LTAC with hospice   Hypercalcemia of malignancy-over 15 on presentation.  PTH appropriately low, PTH RP less than 2.   Received fluids, Lasix  Improved, corrected serum calcium  11.0 - Started on gentle IV hydration with IV fluids and Lasix      Acute  kidney injury-resolved, creatinine back to baseline   Pulmonary fibrosis, emphysema-contributing to his dyspnea   Persistent A-fib  RVR continue dofetilide .  Transition to xarelto  from heparin  today. Continue metoprolol    Concern for severe  sepsis sepsis physiology resolved.   Likely due to his possible upper lobe consolidation.  Noted to have persistent leukocytosis which has worsened since admission, monitor.  Currently afebrile.  -Antibiotics discontinued   Hypokalemia Replace and follow, goal K>4 and mag>2 with tikosyn    Bipolar 1 disorder-continue home regimen        Medications     budesonide -glycopyrrolate -formoterol   2 puff Inhalation BID   dofetilide   500 mcg Oral BID   feeding supplement  237 mL Oral BID BM   gabapentin   300 mg Oral TID   metoprolol  tartrate  37.5 mg Oral BID   polyethylene glycol  17 g Oral Daily   pyridOXINE   100 mg Oral QPM   rivaroxaban   20 mg Oral Q supper     Data Reviewed:   CBG:  No results for input(s): GLUCAP in the last 168 hours.  SpO2: 92 % O2 Flow Rate (L/min): 6 L/min FiO2 (%): 53 %    Vitals:   2024-03-25 2216 02/17/2024 0050 02/05/2024 0406 01/30/2024 0520  BP: 115/87 (!) 134/97 120/85   Pulse: 83 100 (!) 107   Resp: 14 20 20 18   Temp: 98 F (36.7 C)  98 F (36.7 C)   TempSrc: Oral  Oral   SpO2: 96% 91% 92%   Weight:    70.2 kg  Height:          Data Reviewed:  Basic Metabolic Panel: Recent Labs  Lab 02/22/24 0226 02/23/24 0343 02/24/24 0255 02/25/24 0252 02/26/24 0232  NA 137 139 138 140 137  K 3.9 3.4* 4.8 5.0 3.9  CL 102 100 103 100 95*  CO2 24 28 26 29 31   GLUCOSE 110* 128* 130* 109* 123*  BUN 17 17 20 23 21   CREATININE 0.82 0.79 0.86 0.76 0.70  CALCIUM  9.2 9.2 9.5 9.5 9.6  MG 1.8 1.8 2.1  --  1.9  PHOS 1.9* 3.6 2.9  --   --     CBC: Recent Labs  Lab 02/22/24 0226 02/23/24 0343 02/24/24 0255 02/25/24 0252  WBC 47.1* 44.1* 65.9* 58.0*  NEUTROABS 42.9*  --  59.9*  --   HGB 8.9* 8.7* 9.7* 9.4*  HCT 28.8* 27.5* 30.7* 30.5*  MCV 82.1 81.6 82.5 84.0  PLT 475* 432* 484* 443*    LFT Recent Labs  Lab 02/22/24 0226 02/24/24 0255 02/25/24 0252 02/26/24 0232  AST 29 35 41 36  ALT 24 25 26 25   ALKPHOS 114 115 111 109   BILITOT 0.5 0.4 0.6 0.5  PROT 6.1* 5.8* 5.6* 5.3*  ALBUMIN  2.0* 2.1* 2.1* 2.0*     Antibiotics: Anti-infectives (From admission, onward)    Start     Dose/Rate Route Frequency Ordered Stop   02/08/24 1645  piperacillin -tazobactam (ZOSYN ) IVPB 3.375 g  Status:  Discontinued       Note to Pharmacy: Please change dosing if needed.   3.375 g 12.5 mL/hr over 240 Minutes Intravenous Every 8 hours 02/08/24 1549 02/24/24 1145   02/05/24 1800  doxycycline  (VIBRA -TABS) tablet 100 mg        100 mg Oral Every 12 hours 02/05/24 1428 02/09/24 1721   02/04/24 1700  azithromycin  (ZITHROMAX ) 500 mg in sodium chloride  0.9 % 250 mL IVPB  Status:  Discontinued  500 mg 250 mL/hr over 60 Minutes Intravenous Every 24 hours 02/03/24 1945 02/03/24 2145   02/04/24 1600  cefTRIAXone  (ROCEPHIN ) 1 g in sodium chloride  0.9 % 100 mL IVPB  Status:  Discontinued        1 g 200 mL/hr over 30 Minutes Intravenous Every 24 hours 02/03/24 1945 02/08/24 1549   02/04/24 1600  doxycycline  (VIBRAMYCIN ) 100 mg in sodium chloride  0.9 % 250 mL IVPB  Status:  Discontinued        100 mg 125 mL/hr over 120 Minutes Intravenous Every 12 hours 02/03/24 2145 02/05/24 1428   02/03/24 1645  cefTRIAXone  (ROCEPHIN ) 2 g in sodium chloride  0.9 % 100 mL IVPB  Status:  Discontinued        2 g 200 mL/hr over 30 Minutes Intravenous Once 02/03/24 1643 02/03/24 1643   02/03/24 1645  azithromycin  (ZITHROMAX ) 500 mg in sodium chloride  0.9 % 250 mL IVPB  Status:  Discontinued        500 mg 250 mL/hr over 60 Minutes Intravenous  Once 02/03/24 1643 02/03/24 1643   02/03/24 1615  cefTRIAXone  (ROCEPHIN ) 1 g in sodium chloride  0.9 % 100 mL IVPB        1 g 200 mL/hr over 30 Minutes Intravenous  Once 02/03/24 1604 02/03/24 1656   02/03/24 1615  azithromycin  (ZITHROMAX ) 500 mg in sodium chloride  0.9 % 250 mL IVPB        500 mg 250 mL/hr over 60 Minutes Intravenous  Once 02/03/24 1604 02/03/24 1801        DVT prophylaxis: Xarelto   Code  Status: DNR  Family Communication: No family at bedside   CONSULTS oncology, palliative care   Subjective   No new complaints.   Objective    Physical Examination:  Appears lethargic Lungs-bilateral rhonchi auscultated Abdomen is soft, nontender Extremities no edema  Status is: Inpatient:         Sabas GORMAN Brod   [facility] If 7PM-7AM, please contact night-coverage at [email or web address], Office  [phone]   02/09/2024, 7:56 AM  LOS: 25 days

## 2024-02-29 NOTE — Progress Notes (Signed)
 Pt does not look good. Pt had episodes of V-Tach , HR rate in the 150-160. This RN administered PRN lopressor . Pt having episodes of SOB, Labored breathing. This RN administered PRN Albuterol  which gave some relief to pt.  Pt on 7L oxygen. This RN requested some anti-anxiety medication for pt, because was really anxious and agitated. MD Drusilla Mori notified of pt's condition.  New orders put in. This RN administered ativan  to pt. Will continue care per plan.

## 2024-02-29 DEATH — deceased

## 2024-03-01 NOTE — Discharge Summary (Addendum)
 Death Summary  Bradley Ferguson FMW:[id number] DOB: [birth year] DOA: 02/23/24  PCP: Sebastian Beverley NOVAK, MD PCP/Office notified:   Admit date: February 23, 2024 Date of Death: March 19, 2024  Final Diagnoses:  Principal Problem:   Mass of upper lobe of left lung Active Problems:   ANXIETY DEPRESSION   Bipolar I disorder (HCC)   Atrial fibrillation (HCC)   Peripheral arterial disease (HCC)   Chronic obstructive pulmonary disease (HCC)   Interstitial pulmonary disease (HCC)   TB lung, latent   Essential hypertension   Severe sepsis (HCC)   Hypercalcemia   Mediastinal adenopathy      History of present illness:  73 y.o. male with medical history significant of patient with history of COPD, interstitial lung disease, patient has latent tuberculosis, atrial fibrillation on Tikosyn  and Xarelto , peripheral arterial disease, essential hypertension, anxiety disorder depression, history of hematuria with lung consolidation was on antibiotics by pulmonary.     Patient went to see pulmonary on the day of admission due to near syncope and productive cough.  He was found to have significant dyspnea and tachycardia with heart rate in the 120s.  Patient noted to have oxygen saturation 90% on room air.  He was however noted to have hypercalcemia, leukocytosis with left shift and lactic acidosis.     He was admitted for acute hypoxemic respiratory failure in the setting of left upper lobe consolidation and underwent bronchoscopy on 8/19.  Now noted to have worsening leukocytosis and remains on IV antibiotics with BAL showing 20,000 colonies of yeast. He continues to have some worsening leukocytosis that is suspected related to new cancer diagnosis with concern for NSCLC.     Oncology currently recommending hospice.  Palliative care has been consulted to help with goals of care conversations.  Hospital Course:  Acute hypoxic respiratory failure in the setting of left upper lobe consolidation  CT scan on 8/1 showed  slightly increase of the left upper lobe dense consolidation, concerning for infection versus malignancy.  He has been having worsening shortness of breath, cough, hemoptysis on presentation.   Legionella negative, negative urine strep.  Histoplasma antigen negative. Fungitell beta D glucan negative. Blastomyces antigen negative as well - currently on 4 L [place] - CXR 8/25 with findings concerning for RUL pneumonia - cavitary mass in LUL, L effusion - Given history of latent TB, he underwent AFB, now negative x 3 - s/p bronch on 8/19, negative acid fast smear, culture with candida albicans - fungal culture pending - per pulm 8/22, yeast in BAL likely colonization, no need for antifungal  - s/p zosyn  8/11-8/27. -Noted to have worsening leukocytosis likely in the setting of cancer/steroids.  No fever noted.  Continue CBC monitoring. -steroid taper, breztri , albuterol  prn in setting of COPD exacerbation. -Patient's respiratory status became worse, patient expired on 03/19/24 at 7 PM    Stage IV Lung Cancer Surg path with non small cell carcinoma CT chest with mass like consolidation in left upper lobe, prominent mediastinal LN's.  CT abd with mediastinal LAD, increased in size.  Heterogenously enhancing nodule lateral to L psoas concerning for metastatic disease.   Seen by Dr. Timmy, right now planning for hospice it seems.   Palliative care was consulted and was in discussion with going to hospice with LT[facility] versus residential hospice   Hypercalcemia of malignancy-over 15 on presentation.  PTH appropriately low, PTH RP less than 2.   Received fluids, Lasix       Acute kidney injury-resolved   Pulmonary fibrosis, emphysema-contributing to  his dyspnea   Persistent A-fib  RVR continue dofetilide .  Transition to xarelto  from heparin  today. Continue metoprolol    Concern for severe sepsis sepsis physiology resolved.   Likely due to his possible upper lobe consolidation.  Noted to have  persistent leukocytosis which has worsened since admission, monitor.  Currently afebrile.  -Antibiotics discontinued    Bipolar 1 disorder-continue home regimen     Time: Time of death 7 PM, date of death 03-09-24  Signed:  Sabas GORMAN Brod  [facility] 03/01/2024, 5:48 PM

## 2024-03-02 ENCOUNTER — Ambulatory Visit (HOSPITAL_COMMUNITY)

## 2024-03-03 ENCOUNTER — Other Ambulatory Visit (HOSPITAL_COMMUNITY): Payer: Self-pay

## 2024-03-08 ENCOUNTER — Ambulatory Visit: Admitting: Vascular Surgery

## 2024-03-17 LAB — FUNGUS CULTURE RESULT

## 2024-03-17 LAB — FUNGUS CULTURE WITH STAIN

## 2024-03-17 LAB — FUNGAL ORGANISM REFLEX

## 2024-03-20 LAB — ACID FAST CULTURE WITH REFLEXED SENSITIVITIES (MYCOBACTERIA)
Acid Fast Culture: NEGATIVE
Acid Fast Culture: NEGATIVE

## 2024-03-23 ENCOUNTER — Ambulatory Visit: Admitting: Cardiovascular Disease

## 2024-03-30 LAB — ACID FAST CULTURE WITH REFLEXED SENSITIVITIES (MYCOBACTERIA): Acid Fast Culture: NEGATIVE

## 2024-03-31 LAB — MISC LABCORP TEST (SEND OUT): Labcorp test code: 183469

## 2024-03-31 LAB — ACID FAST SMEAR (AFB, MYCOBACTERIA): Acid Fast Smear: NEGATIVE

## 2024-03-31 LAB — ACID FAST CULTURE WITH REFLEXED SENSITIVITIES (MYCOBACTERIA)
Acid Fast Culture: NEGATIVE
Acid Fast Culture: NEGATIVE

## 2024-03-31 LAB — MTB-RIF NAA WITH AFB CULTURE, SPUTUM
Acid Fast Culture: NEGATIVE
Myco tuberculosis Complex: NOT DETECTED

## 2024-04-18 ENCOUNTER — Ambulatory Visit: Admitting: Cardiovascular Disease

## 2024-11-18 ENCOUNTER — Ambulatory Visit
